# Patient Record
Sex: Female | Born: 1965 | Race: White | Hispanic: No | Marital: Married | Smoking: Never smoker
Health system: Southern US, Community
[De-identification: ages and names within clinical notes are randomized; demographics above are authoritative.]

## PROBLEM LIST (undated history)

## (undated) DIAGNOSIS — G43909 Migraine, unspecified, not intractable, without status migrainosus: Secondary | ICD-10-CM

## (undated) DIAGNOSIS — I10 Essential (primary) hypertension: Secondary | ICD-10-CM

## (undated) DIAGNOSIS — M542 Cervicalgia: Secondary | ICD-10-CM

## (undated) DIAGNOSIS — B9681 Helicobacter pylori [H. pylori] as the cause of diseases classified elsewhere: Secondary | ICD-10-CM

## (undated) DIAGNOSIS — G35 Multiple sclerosis: Secondary | ICD-10-CM

## (undated) DIAGNOSIS — K5900 Constipation, unspecified: Secondary | ICD-10-CM

## (undated) DIAGNOSIS — G43009 Migraine without aura, not intractable, without status migrainosus: Secondary | ICD-10-CM

## (undated) DIAGNOSIS — Z8639 Personal history of other endocrine, nutritional and metabolic disease: Secondary | ICD-10-CM

## (undated) DIAGNOSIS — K2970 Gastritis, unspecified, without bleeding: Secondary | ICD-10-CM

## (undated) DIAGNOSIS — G8929 Other chronic pain: Secondary | ICD-10-CM

## (undated) DIAGNOSIS — E785 Hyperlipidemia, unspecified: Secondary | ICD-10-CM

## (undated) DIAGNOSIS — E119 Type 2 diabetes mellitus without complications: Secondary | ICD-10-CM

## (undated) DIAGNOSIS — K760 Fatty (change of) liver, not elsewhere classified: Secondary | ICD-10-CM

## (undated) DIAGNOSIS — R109 Unspecified abdominal pain: Secondary | ICD-10-CM

## (undated) DIAGNOSIS — K219 Gastro-esophageal reflux disease without esophagitis: Secondary | ICD-10-CM

## (undated) DIAGNOSIS — G4730 Sleep apnea, unspecified: Secondary | ICD-10-CM

## (undated) DIAGNOSIS — Z862 Personal history of diseases of the blood and blood-forming organs and certain disorders involving the immune mechanism: Secondary | ICD-10-CM

## (undated) DIAGNOSIS — R1310 Dysphagia, unspecified: Secondary | ICD-10-CM

## (undated) HISTORY — DX: Helicobacter pylori (H. pylori) as the cause of diseases classified elsewhere: B96.81

## (undated) HISTORY — DX: Fatty (change of) liver, not elsewhere classified: K76.0

## (undated) HISTORY — PX: NECK SURGERY: SHX720

## (undated) HISTORY — PX: MANDIBLE SURGERY: SHX707

## (undated) HISTORY — DX: Hyperlipidemia, unspecified: E78.5

## (undated) HISTORY — PX: CHOLECYSTECTOMY: SHX55

## (undated) HISTORY — PX: ABDOMINAL HYSTERECTOMY: SHX81

## (undated) HISTORY — PX: APPENDECTOMY: SHX54

## (undated) HISTORY — DX: Gastritis, unspecified, without bleeding: K29.70

## (undated) HISTORY — DX: Essential (primary) hypertension: I10

---

## 2000-07-05 ENCOUNTER — Ambulatory Visit (HOSPITAL_COMMUNITY): Admission: RE | Admit: 2000-07-05 | Discharge: 2000-07-05 | Payer: Self-pay | Admitting: Pulmonary Disease

## 2000-11-02 ENCOUNTER — Ambulatory Visit (HOSPITAL_COMMUNITY): Admission: RE | Admit: 2000-11-02 | Discharge: 2000-11-02 | Payer: Self-pay | Admitting: Pulmonary Disease

## 2000-11-09 ENCOUNTER — Ambulatory Visit (HOSPITAL_COMMUNITY): Admission: RE | Admit: 2000-11-09 | Discharge: 2000-11-09 | Payer: Self-pay | Admitting: Pulmonary Disease

## 2000-11-09 ENCOUNTER — Emergency Department (HOSPITAL_COMMUNITY): Admission: EM | Admit: 2000-11-09 | Discharge: 2000-11-09 | Payer: Self-pay | Admitting: *Deleted

## 2000-11-09 ENCOUNTER — Encounter: Payer: Self-pay | Admitting: *Deleted

## 2000-11-10 ENCOUNTER — Encounter (HOSPITAL_COMMUNITY): Admission: RE | Admit: 2000-11-10 | Discharge: 2000-12-10 | Payer: Self-pay | Admitting: Pulmonary Disease

## 2000-12-21 ENCOUNTER — Encounter (HOSPITAL_COMMUNITY): Admission: RE | Admit: 2000-12-21 | Discharge: 2001-01-20 | Payer: Self-pay | Admitting: Pulmonary Disease

## 2001-04-28 ENCOUNTER — Other Ambulatory Visit: Admission: RE | Admit: 2001-04-28 | Discharge: 2001-04-28 | Payer: Self-pay | Admitting: Dermatology

## 2001-05-19 ENCOUNTER — Inpatient Hospital Stay (HOSPITAL_COMMUNITY): Admission: RE | Admit: 2001-05-19 | Discharge: 2001-05-20 | Payer: Self-pay | Admitting: Obstetrics & Gynecology

## 2001-12-14 ENCOUNTER — Encounter: Payer: Self-pay | Admitting: Internal Medicine

## 2001-12-14 ENCOUNTER — Emergency Department (HOSPITAL_COMMUNITY): Admission: EM | Admit: 2001-12-14 | Discharge: 2001-12-15 | Payer: Self-pay | Admitting: Internal Medicine

## 2002-03-16 ENCOUNTER — Emergency Department (HOSPITAL_COMMUNITY): Admission: EM | Admit: 2002-03-16 | Discharge: 2002-03-17 | Payer: Self-pay | Admitting: Internal Medicine

## 2002-07-05 ENCOUNTER — Ambulatory Visit (HOSPITAL_COMMUNITY): Admission: RE | Admit: 2002-07-05 | Discharge: 2002-07-05 | Payer: Self-pay | Admitting: Pulmonary Disease

## 2003-02-24 ENCOUNTER — Emergency Department (HOSPITAL_COMMUNITY): Admission: EM | Admit: 2003-02-24 | Discharge: 2003-02-24 | Payer: Self-pay | Admitting: *Deleted

## 2003-10-25 ENCOUNTER — Emergency Department (HOSPITAL_COMMUNITY): Admission: EM | Admit: 2003-10-25 | Discharge: 2003-10-25 | Payer: Self-pay | Admitting: Emergency Medicine

## 2004-01-21 ENCOUNTER — Emergency Department (HOSPITAL_COMMUNITY): Admission: EM | Admit: 2004-01-21 | Discharge: 2004-01-21 | Payer: Self-pay | Admitting: Emergency Medicine

## 2004-01-30 ENCOUNTER — Ambulatory Visit (HOSPITAL_COMMUNITY): Admission: RE | Admit: 2004-01-30 | Discharge: 2004-01-30 | Payer: Self-pay | Admitting: Orthopaedic Surgery

## 2004-05-07 ENCOUNTER — Ambulatory Visit (HOSPITAL_COMMUNITY): Admission: RE | Admit: 2004-05-07 | Discharge: 2004-05-07 | Payer: Self-pay | Admitting: *Deleted

## 2004-05-30 ENCOUNTER — Ambulatory Visit (HOSPITAL_COMMUNITY): Admission: RE | Admit: 2004-05-30 | Discharge: 2004-05-30 | Payer: Self-pay | Admitting: Family Medicine

## 2005-01-25 ENCOUNTER — Emergency Department (HOSPITAL_COMMUNITY): Admission: EM | Admit: 2005-01-25 | Discharge: 2005-01-25 | Payer: Self-pay | Admitting: Emergency Medicine

## 2005-03-30 HISTORY — PX: ESOPHAGOGASTRODUODENOSCOPY: SHX1529

## 2005-04-24 ENCOUNTER — Emergency Department (HOSPITAL_COMMUNITY): Admission: EM | Admit: 2005-04-24 | Discharge: 2005-04-24 | Payer: Self-pay | Admitting: Emergency Medicine

## 2005-05-25 ENCOUNTER — Ambulatory Visit (HOSPITAL_COMMUNITY): Admission: RE | Admit: 2005-05-25 | Discharge: 2005-05-25 | Payer: Self-pay | Admitting: Family Medicine

## 2005-07-17 ENCOUNTER — Emergency Department (HOSPITAL_COMMUNITY): Admission: EM | Admit: 2005-07-17 | Discharge: 2005-07-18 | Payer: Self-pay | Admitting: Emergency Medicine

## 2005-07-27 ENCOUNTER — Emergency Department (HOSPITAL_COMMUNITY): Admission: EM | Admit: 2005-07-27 | Discharge: 2005-07-27 | Payer: Self-pay | Admitting: Emergency Medicine

## 2005-07-27 ENCOUNTER — Inpatient Hospital Stay (HOSPITAL_COMMUNITY): Admission: EM | Admit: 2005-07-27 | Discharge: 2005-07-31 | Payer: Self-pay | Admitting: Emergency Medicine

## 2005-08-26 ENCOUNTER — Ambulatory Visit: Payer: Self-pay | Admitting: Infectious Diseases

## 2005-09-10 ENCOUNTER — Encounter (INDEPENDENT_AMBULATORY_CARE_PROVIDER_SITE_OTHER): Payer: Self-pay | Admitting: Specialist

## 2005-09-10 ENCOUNTER — Observation Stay (HOSPITAL_COMMUNITY): Admission: RE | Admit: 2005-09-10 | Discharge: 2005-09-11 | Payer: Self-pay | Admitting: Obstetrics and Gynecology

## 2005-10-28 ENCOUNTER — Ambulatory Visit: Payer: Self-pay | Admitting: Infectious Diseases

## 2005-11-16 ENCOUNTER — Emergency Department (HOSPITAL_COMMUNITY): Admission: EM | Admit: 2005-11-16 | Discharge: 2005-11-16 | Payer: Self-pay | Admitting: Emergency Medicine

## 2005-11-16 ENCOUNTER — Inpatient Hospital Stay (HOSPITAL_COMMUNITY): Admission: EM | Admit: 2005-11-16 | Discharge: 2005-11-19 | Payer: Self-pay | Admitting: Emergency Medicine

## 2005-11-18 ENCOUNTER — Encounter (INDEPENDENT_AMBULATORY_CARE_PROVIDER_SITE_OTHER): Payer: Self-pay | Admitting: Specialist

## 2005-11-18 ENCOUNTER — Ambulatory Visit: Payer: Self-pay | Admitting: Internal Medicine

## 2006-05-10 ENCOUNTER — Inpatient Hospital Stay (HOSPITAL_COMMUNITY): Admission: EM | Admit: 2006-05-10 | Discharge: 2006-05-13 | Payer: Self-pay | Admitting: Emergency Medicine

## 2006-05-10 ENCOUNTER — Ambulatory Visit: Payer: Self-pay | Admitting: Internal Medicine

## 2006-05-24 ENCOUNTER — Ambulatory Visit: Payer: Self-pay | Admitting: Internal Medicine

## 2006-05-28 ENCOUNTER — Ambulatory Visit (HOSPITAL_COMMUNITY): Admission: RE | Admit: 2006-05-28 | Discharge: 2006-05-28 | Payer: Self-pay | Admitting: Family Medicine

## 2006-08-02 ENCOUNTER — Emergency Department (HOSPITAL_COMMUNITY): Admission: EM | Admit: 2006-08-02 | Discharge: 2006-08-02 | Payer: Self-pay | Admitting: Emergency Medicine

## 2007-02-21 ENCOUNTER — Ambulatory Visit (HOSPITAL_COMMUNITY): Admission: RE | Admit: 2007-02-21 | Discharge: 2007-02-21 | Payer: Self-pay | Admitting: Family Medicine

## 2007-05-10 ENCOUNTER — Ambulatory Visit (HOSPITAL_COMMUNITY): Admission: RE | Admit: 2007-05-10 | Discharge: 2007-05-10 | Payer: Self-pay | Admitting: Family Medicine

## 2008-03-07 ENCOUNTER — Ambulatory Visit (HOSPITAL_COMMUNITY): Admission: RE | Admit: 2008-03-07 | Discharge: 2008-03-07 | Payer: Self-pay | Admitting: Family Medicine

## 2008-06-06 ENCOUNTER — Ambulatory Visit (HOSPITAL_COMMUNITY): Admission: RE | Admit: 2008-06-06 | Discharge: 2008-06-06 | Payer: Self-pay | Admitting: Family Medicine

## 2008-12-12 ENCOUNTER — Ambulatory Visit (HOSPITAL_COMMUNITY): Admission: RE | Admit: 2008-12-12 | Discharge: 2008-12-12 | Payer: Self-pay | Admitting: Family Medicine

## 2009-03-11 ENCOUNTER — Ambulatory Visit (HOSPITAL_COMMUNITY): Admission: RE | Admit: 2009-03-11 | Discharge: 2009-03-11 | Payer: Self-pay | Admitting: Family Medicine

## 2009-03-30 DIAGNOSIS — B9681 Helicobacter pylori [H. pylori] as the cause of diseases classified elsewhere: Secondary | ICD-10-CM

## 2009-03-30 HISTORY — DX: Helicobacter pylori (H. pylori) as the cause of diseases classified elsewhere: B96.81

## 2009-04-19 ENCOUNTER — Ambulatory Visit (HOSPITAL_COMMUNITY): Admission: RE | Admit: 2009-04-19 | Discharge: 2009-04-19 | Payer: Self-pay | Admitting: Rheumatology

## 2009-04-23 ENCOUNTER — Ambulatory Visit (HOSPITAL_COMMUNITY): Admission: RE | Admit: 2009-04-23 | Discharge: 2009-04-23 | Payer: Self-pay | Admitting: Neurology

## 2009-05-03 ENCOUNTER — Ambulatory Visit (HOSPITAL_COMMUNITY): Admission: RE | Admit: 2009-05-03 | Discharge: 2009-05-03 | Payer: Self-pay | Admitting: Psychiatry

## 2009-05-08 ENCOUNTER — Ambulatory Visit (HOSPITAL_COMMUNITY): Admission: RE | Admit: 2009-05-08 | Discharge: 2009-05-08 | Payer: Self-pay | Admitting: Family Medicine

## 2009-05-14 DIAGNOSIS — G43009 Migraine without aura, not intractable, without status migrainosus: Secondary | ICD-10-CM | POA: Insufficient documentation

## 2009-05-14 DIAGNOSIS — Z862 Personal history of diseases of the blood and blood-forming organs and certain disorders involving the immune mechanism: Secondary | ICD-10-CM

## 2009-05-14 DIAGNOSIS — R5381 Other malaise: Secondary | ICD-10-CM | POA: Insufficient documentation

## 2009-05-14 DIAGNOSIS — R197 Diarrhea, unspecified: Secondary | ICD-10-CM | POA: Insufficient documentation

## 2009-05-14 DIAGNOSIS — R1110 Vomiting, unspecified: Secondary | ICD-10-CM | POA: Insufficient documentation

## 2009-05-14 DIAGNOSIS — R110 Nausea: Secondary | ICD-10-CM | POA: Insufficient documentation

## 2009-05-14 DIAGNOSIS — Z8639 Personal history of other endocrine, nutritional and metabolic disease: Secondary | ICD-10-CM

## 2009-05-14 DIAGNOSIS — R5383 Other fatigue: Secondary | ICD-10-CM

## 2009-05-14 DIAGNOSIS — Z91199 Patient's noncompliance with other medical treatment and regimen due to unspecified reason: Secondary | ICD-10-CM | POA: Insufficient documentation

## 2009-05-14 DIAGNOSIS — R630 Anorexia: Secondary | ICD-10-CM | POA: Insufficient documentation

## 2009-05-14 DIAGNOSIS — J189 Pneumonia, unspecified organism: Secondary | ICD-10-CM | POA: Insufficient documentation

## 2009-05-14 DIAGNOSIS — K219 Gastro-esophageal reflux disease without esophagitis: Secondary | ICD-10-CM | POA: Insufficient documentation

## 2009-05-14 DIAGNOSIS — Z8659 Personal history of other mental and behavioral disorders: Secondary | ICD-10-CM | POA: Insufficient documentation

## 2009-05-14 DIAGNOSIS — J45909 Unspecified asthma, uncomplicated: Secondary | ICD-10-CM | POA: Insufficient documentation

## 2009-05-14 DIAGNOSIS — A048 Other specified bacterial intestinal infections: Secondary | ICD-10-CM | POA: Insufficient documentation

## 2009-05-14 HISTORY — DX: Patient's noncompliance with other medical treatment and regimen due to unspecified reason: Z91.199

## 2009-05-14 HISTORY — DX: Personal history of diseases of the blood and blood-forming organs and certain disorders involving the immune mechanism: Z86.2

## 2009-05-14 HISTORY — DX: Migraine without aura, not intractable, without status migrainosus: G43.009

## 2009-05-16 ENCOUNTER — Ambulatory Visit: Payer: Self-pay | Admitting: Internal Medicine

## 2009-05-16 DIAGNOSIS — K5900 Constipation, unspecified: Secondary | ICD-10-CM | POA: Insufficient documentation

## 2009-05-16 DIAGNOSIS — R109 Unspecified abdominal pain: Secondary | ICD-10-CM | POA: Insufficient documentation

## 2009-05-16 DIAGNOSIS — K7689 Other specified diseases of liver: Secondary | ICD-10-CM | POA: Insufficient documentation

## 2009-05-16 HISTORY — DX: Constipation, unspecified: K59.00

## 2009-05-20 ENCOUNTER — Encounter: Payer: Self-pay | Admitting: Internal Medicine

## 2009-05-22 ENCOUNTER — Ambulatory Visit: Payer: Self-pay | Admitting: Internal Medicine

## 2009-05-22 ENCOUNTER — Ambulatory Visit (HOSPITAL_COMMUNITY): Admission: RE | Admit: 2009-05-22 | Discharge: 2009-05-22 | Payer: Self-pay | Admitting: Internal Medicine

## 2009-05-22 HISTORY — PX: ESOPHAGOGASTRODUODENOSCOPY: SHX1529

## 2009-05-24 ENCOUNTER — Ambulatory Visit (HOSPITAL_COMMUNITY): Admission: RE | Admit: 2009-05-24 | Discharge: 2009-05-24 | Payer: Self-pay | Admitting: Internal Medicine

## 2009-06-10 ENCOUNTER — Telehealth (INDEPENDENT_AMBULATORY_CARE_PROVIDER_SITE_OTHER): Payer: Self-pay

## 2009-06-11 ENCOUNTER — Encounter: Payer: Self-pay | Admitting: Internal Medicine

## 2009-06-11 ENCOUNTER — Encounter: Payer: Self-pay | Admitting: Urgent Care

## 2009-06-17 ENCOUNTER — Ambulatory Visit (HOSPITAL_COMMUNITY): Admission: RE | Admit: 2009-06-17 | Discharge: 2009-06-17 | Payer: Self-pay | Admitting: Internal Medicine

## 2009-07-01 ENCOUNTER — Ambulatory Visit (HOSPITAL_COMMUNITY): Admission: RE | Admit: 2009-07-01 | Discharge: 2009-07-01 | Payer: Self-pay | Admitting: Family Medicine

## 2009-07-02 ENCOUNTER — Emergency Department (HOSPITAL_COMMUNITY): Admission: EM | Admit: 2009-07-02 | Discharge: 2009-07-02 | Payer: Self-pay | Admitting: Emergency Medicine

## 2009-07-04 ENCOUNTER — Ambulatory Visit: Payer: Self-pay | Admitting: Internal Medicine

## 2009-07-04 ENCOUNTER — Encounter (INDEPENDENT_AMBULATORY_CARE_PROVIDER_SITE_OTHER): Payer: Self-pay

## 2009-07-05 DIAGNOSIS — R1011 Right upper quadrant pain: Secondary | ICD-10-CM | POA: Insufficient documentation

## 2009-07-05 DIAGNOSIS — K625 Hemorrhage of anus and rectum: Secondary | ICD-10-CM | POA: Insufficient documentation

## 2009-07-05 HISTORY — DX: Hemorrhage of anus and rectum: K62.5

## 2009-07-08 ENCOUNTER — Encounter: Payer: Self-pay | Admitting: Internal Medicine

## 2009-07-09 ENCOUNTER — Ambulatory Visit (HOSPITAL_COMMUNITY): Admission: RE | Admit: 2009-07-09 | Discharge: 2009-07-09 | Payer: Self-pay | Admitting: Internal Medicine

## 2009-07-09 ENCOUNTER — Ambulatory Visit: Payer: Self-pay | Admitting: Internal Medicine

## 2009-07-09 HISTORY — PX: COLONOSCOPY: SHX5424

## 2009-07-12 ENCOUNTER — Encounter: Payer: Self-pay | Admitting: Internal Medicine

## 2009-07-23 ENCOUNTER — Emergency Department (HOSPITAL_COMMUNITY): Admission: EM | Admit: 2009-07-23 | Discharge: 2009-07-23 | Payer: Self-pay | Admitting: Emergency Medicine

## 2009-08-07 ENCOUNTER — Ambulatory Visit (HOSPITAL_COMMUNITY): Admission: RE | Admit: 2009-08-07 | Discharge: 2009-08-07 | Payer: Self-pay | Admitting: *Deleted

## 2009-08-14 ENCOUNTER — Encounter: Payer: Self-pay | Admitting: Internal Medicine

## 2009-12-12 ENCOUNTER — Emergency Department (HOSPITAL_COMMUNITY): Admission: EM | Admit: 2009-12-12 | Discharge: 2009-12-12 | Payer: Self-pay | Admitting: Emergency Medicine

## 2010-02-19 ENCOUNTER — Emergency Department (HOSPITAL_COMMUNITY): Admission: EM | Admit: 2010-02-19 | Discharge: 2010-02-19 | Payer: Self-pay | Admitting: Emergency Medicine

## 2010-03-01 ENCOUNTER — Emergency Department (HOSPITAL_COMMUNITY)
Admission: EM | Admit: 2010-03-01 | Discharge: 2010-03-01 | Payer: Self-pay | Source: Home / Self Care | Admitting: Emergency Medicine

## 2010-04-03 ENCOUNTER — Ambulatory Visit (HOSPITAL_COMMUNITY)
Admission: RE | Admit: 2010-04-03 | Discharge: 2010-04-03 | Payer: Self-pay | Source: Home / Self Care | Attending: Internal Medicine | Admitting: Internal Medicine

## 2010-04-20 ENCOUNTER — Encounter: Payer: Self-pay | Admitting: Internal Medicine

## 2010-04-20 ENCOUNTER — Encounter: Payer: Self-pay | Admitting: Family Medicine

## 2010-04-29 NOTE — Letter (Signed)
Summary: External Other  External Other   Imported By: Peggyann Shoals 08/14/2009 11:41:08  _____________________________________________________________________  External Attachment:    Type:   Image     Comment:   External Document

## 2010-04-29 NOTE — Miscellaneous (Signed)
Summary: Orders Update  Clinical Lists Changes  Problems: Added new problem of SCREENING FOR UNSPECIFIED CONDITION (ICD-V82.9) Orders: Added new Test order of T-Creatinine Blood ([id number]) - Signed 

## 2010-04-29 NOTE — Assessment & Plan Note (Signed)
Summary: e30,abd pain,glu   Visit Type:  Initial Consult Referring Provider:  [facility] Primary Care Provider:  Rosey Bath [facility]  Chief Complaint:  Abd pain and left side pain.  History of Present Illness: 45 y/o caucasian female w/ left-sided abd pain x 9 months.  Can't lay on left side.  Constant sharp pain, radiates around flank to back.  Pain 5/10-10/10 at worst.  Some constipation BM 2-3 per week, hard stools.  Not taking anything for this.  Denies rectal bleeding or melena.  Weight loss approx 3# per month w/ healthy eating, controlling portions, and walking.  Pain is worse w/ walking.  c/o nausea w/ pain, no vomiting.  Coke helps w/ nausea.  Taking oxycodone and flexeril for disc disease at night only x 3 months.  No NSAIDs.  Denies any fever, occ chills.  c/o HB and indigestion, worse w/ chocolate & spicy foods, about once weekly.  Takes omeprazole 20mg  as needed w/ good relief.  Occ TUMS.  Denies dysphagia or odynophagia.   Hx h pylori w/ previous treatment yrs ago.  Current Problems (verified): 1)  Fatty Liver Disease  (ICD-571.8) 2)  Abdominal Pain  (ICD-789.00) 3)  Esophageal Reflux  (ICD-530.81) 4)  Migraine, Common  (ICD-346.10) 5)  Pneumonia, Atypical  (ICD-486) 6)  Asthma  (ICD-493.90) 7)  Helicobacter Pylori Gastritis  (ICD-041.86) 8)  Depression, Hx of  (ICD-V11.8) 9)  Anorexia  (ICD-783.0) 10)  Fatigue  (ICD-780.79) 11)  Liver Function Tests, Abnormal, Hx of  (ICD-V12.2) 12)  Diarrhea  (ICD-787.91) 13)  Vomiting  (ICD-787.03) 14)  Nausea  (ICD-787.02)  Current Medications (verified): 1)  Celexa 40 Mg Tabs (Citalopram Hydrobromide) .... At Bedtime 2)  Proair .... As Needed 3)  Midrin .... 2 @ Onset of Ha 4)  Epi Pen .... As Needed 5)  Oxycodone Hcl 5 Mg Tabs (Oxycodone Hcl) .... As Needed 6)  Flexeril 10 Mg Tabs (Cyclobenzaprine Hcl) .... As Needed 7)  Topamax 25 Mg Tabs (Topiramate) .... Take Two Tablets in The Am and  Two Tablets in The Pm 8)  Omeprazole 20  Mg Cpdr (Omeprazole) .Marland Kitchen.. 1 By Mouth As Needed Gerd 9)  Tums 500 Mg Chew (Calcium Carbonate Antacid) .... 2 By Mouth As Needed Gerd  Allergies (verified): 1)  ! Prednisone 2)  ! Prilosec 3)  ! * Peanuts  Past History:  Past Medical History: FATTY LIVER DISEASE (ICD-571.8) ESOPHAGEAL REFLUX (ICD-530.81) MIGRAINE, COMMON (ICD-346.10) PNEUMONIA, ATYPICAL (ICD-486) ASTHMA (ICD-493.90) HELICOBACTER PYLORI GASTRITIS (ICD-041.86) on EGD 8/07, non-critical Schatski's ring Hx atypical PNA MAI 2007 DEPRESSION, HX OF (ICD-V11.8) Colonoscopy 2002 reportedly normal  Past Surgical History: CHOLECYSTECTOMY 2002 PARTIAL HYSTERECTOMY 2003 BILATERAL SALPINGO-OOPHORECTOMY WITH BLADDER SUSPENSION 2007 PLATE PLACED IN NECK 2006 APPENDECTOMY REDUCTION OF LOWER JAW AT AGE 67 right hand surgery  Family History: No known family history of colorectal carcinoma, IBD, liver or chronic GI problems. Father:(late 60's) DM, HTN, CAD Mother: (deceased 54) CA? Siblings: 3 DM  Social History: married, 5 children raising 39 y/o grandson Patient has never smoked.  Alcohol Use - yes, rare  Review of Systems      See HPI General:  Denies fever, chills, sweats, anorexia, fatigue, weakness, malaise, and sleep disorder. CV:  Denies chest pains, angina, palpitations, syncope, dyspnea on exertion, orthopnea, PND, peripheral edema, and claudication. Resp:  Denies dyspnea at rest, dyspnea with exercise, cough, sputum, wheezing, coughing up blood, and pleurisy. GI:  Denies difficulty swallowing, pain on swallowing, jaundice, and fecal incontinence. GU:  Denies urinary burning, blood  in urine, nocturnal urination, urinary frequency, and urinary incontinence. Derm:  Denies rash, itching, dry skin, hives, moles, warts, and unhealing ulcers. Psych:  Denies depression, anxiety, memory loss, suicidal ideation, hallucinations, paranoia, phobia, and confusion. Heme:  Denies bruising, bleeding, and enlarged lymph  nodes.  Vital Signs:  Patient profile:   45 year old female Height:      65 inches Weight:      223 pounds BMI:     37.24 Temp:     99.9 degrees F oral Pulse rate:   60 / minute BP sitting:   130 / 90  (left arm) Cuff size:   regular  Vitals Entered By: Cloria Spring LPN (May 16, 2009 9:45 AM)  Physical Exam  General:  Well developed, well nourished, no acute distress.obese.   Head:  Normocephalic and atraumatic. Eyes:  Sclera clear, no icterus. Ears:  Normal auditory acuity. Nose:  No deformity, discharge,  or lesions. Mouth:  No deformity or lesions, dentition normal. Neck:  Supple; no masses or thyromegaly. Lungs:  Clear throughout to auscultation. Heart:  Regular rate and rhythm; no murmurs, rubs,  or bruits. Abdomen:  normal bowel sounds, obese, LUQ tenderness, L flank tenderness, with guarding, without rebound, no hernia, no distesion, no masses, and no hepatomegally or splenomegaly.  Positive Carnett's. Msk:  Symmetrical with no gross deformities. Normal posture. Pulses:  Normal pulses noted. Extremities:  No clubbing, cyanosis, edema or deformities noted. Neurologic:  Alert and  oriented x4;  grossly normal neurologically. Skin:  Intact without significant lesions or rashes. Cervical Nodes:  No significant cervical adenopathy. Psych:  Alert and cooperative. Normal mood and affect.  Impression & Recommendations:  Problem # 1:  ABDOMINAL PAIN (ICD-789.00) 45 y/o caucasian female w/ 33-month hx abd pain, along with GERD & constipation.  ?functional component, abd wall pain, and/or possible referred back pain.  Discussed CT findings of transient intussusception w/ Dr. Darrick Penna and Dr. Argentina Donovan (radiologist).  No lead point or wall thickening, suspect not clinically significant, however could consider CTE if continued LUQ pain.  Next step EGD w/ possible small bowel bx r/o celiac disease.  Need to verify h pylori eradication.  EGD to be performed by Dr. Jonathon Bellows  in the near future.  I have discussed risks and benefits which include, but are not limited to, bleeding, infection, perforation, or medication reaction.  The patient agrees with this plan and consent will be obtained.  Orders: T-Helicobactor Pylori Antigen Stool (04540) Est. Patient Level IV (98119)  Problem # 2:  ESOPHAGEAL REFLUX (ICD-530.81) See #!  Problem # 3:  HELICOBACTER PYLORI GASTRITIS (ICD-041.86) see #1  Problem # 4:  FATTY LIVER DISEASE (ICD-571.8) Assessment: Comment Only  Problem # 5:  CONSTIPATION (ICD-564.00)

## 2010-04-29 NOTE — Letter (Signed)
Summary: MRI ORDER  MRI ORDER   Imported By: Ave Filter 06/11/2009 10:08:29  _____________________________________________________________________  External Attachment:    Type:   Image     Comment:   External Document

## 2010-04-29 NOTE — Assessment & Plan Note (Signed)
Summary: luq pain.gu   Visit Type:  Follow-up Visit Primary Care Provider:  [facility]  Chief Complaint:  abd pain, nausea, and constipation.  History of Present Illness: 6 month history of left upper quadrant abdominal pain. Prior CT demonstrated a transient intussusception  CT enteroscopy failed to demonstrate any bowel abnormality. She's never developed a rash to suggest zoster as an etiology. She has a difficult time lying on her left side; there was concern for radicular component to her symptoms. She underwent an MRI spine which demonstrated no significant lesion which would explain her current symptoms.  EGD demonstrated H. pyloric gastritis. She did take therapy. However, this did not help with her symptoms.  She does relate a six-month history of relative constipation as well. She's going from one bowel movement daily to every other day to no more than one bowel movement weekly - often utilizing magnesium citrate p.r.n. She states she tried MiraLax only temporarily without any perceived  improvement in bowel function.  She further states that left upper quadrant pain is getting worse and is getting the "best of her". She has not had any fever or chills. No nausea or vomiting.  She has managed to gain 8 pounds since her last visit here.  It is noteworthy that she noted some intermittent bright red blood per rectum with wiping and on the stool from time to time recently.  This nice lady  just underwent yet another CT scan 3 days ago which, again, did not elucidate a cause for this nice ladies.  Chem 20 from April 4 looked  good except for a slightly elevated ALT of 53; CBC was completely normal lipase was normal at 17.  Current Problems (verified): 1)  Screening For Unspecified Condition  (ICD-V82.9) 2)  Constipation  (ICD-564.00) 3)  Fatty Liver Disease  (ICD-571.8) 4)  Abdominal Pain  (ICD-789.00) 5)  Esophageal Reflux  (ICD-530.81) 6)  Migraine, Common  (ICD-346.10) 7)   Pneumonia, Atypical  (ICD-486) 8)  Asthma  (ICD-493.90) 9)  Helicobacter Pylori Gastritis  (ICD-041.86) 10)  Depression, Hx of  (ICD-V11.8) 11)  Anorexia  (ICD-783.0) 12)  Fatigue  (ICD-780.79) 13)  Liver Function Tests, Abnormal, Hx of  (ICD-V12.2) 14)  Diarrhea  (ICD-787.91) 15)  Vomiting  (ICD-787.03) 16)  Nausea  (ICD-787.02)  Current Medications (verified): 1)  Celexa 40 Mg Tabs (Citalopram Hydrobromide) .... At Bedtime 2)  Proair .... As Needed 3)  Midrin .... 2 @ Onset of Ha 4)  Epi Pen .... As Needed 5)  Oxycodone Hcl 5 Mg Tabs (Oxycodone Hcl) .... As Needed 6)  Flexeril 10 Mg Tabs (Cyclobenzaprine Hcl) .... As Needed 7)  Topamax 25 Mg Tabs (Topiramate) .... Take Two Tablets in The Am and  Two Tablets in The Pm 8)  Omeprazole 20 Mg Cpdr (Omeprazole) .Marland Kitchen.. 1 By Mouth As Needed Gerd 9)  Tums 500 Mg Chew (Calcium Carbonate Antacid) .... 2 By Mouth As Needed Gerd  Allergies (verified): 1)  ! Prednisone 2)  ! Prilosec 3)  ! * Peanuts  Past History:  Past Medical History: Last updated: 05/16/2009 FATTY LIVER DISEASE (ICD-571.8) ESOPHAGEAL REFLUX (ICD-530.81) MIGRAINE, COMMON (ICD-346.10) PNEUMONIA, ATYPICAL (ICD-486) ASTHMA (ICD-493.90) HELICOBACTER PYLORI GASTRITIS (ICD-041.86) on EGD 8/07, non-critical Schatski's ring Hx atypical PNA MAI 2007 DEPRESSION, HX OF (ICD-V11.8) Colonoscopy 2002 reportedly normal  Past Surgical History: Last updated: 05/16/2009 CHOLECYSTECTOMY 2002 PARTIAL HYSTERECTOMY 2003 BILATERAL SALPINGO-OOPHORECTOMY WITH BLADDER SUSPENSION 2007 PLATE PLACED IN NECK 2006 APPENDECTOMY REDUCTION OF LOWER JAW AT AGE 45 right  hand surgery  Family History: Last updated: 05/16/2009 No known family history of colorectal carcinoma, IBD, liver or chronic GI problems. Father:(late 60's) DM, HTN, CAD Mother: (deceased 3) CA? Siblings: 3 DM  Social History: Last updated: 05/16/2009 married, 5 children raising 82 y/o grandson Patient has never  smoked.  Alcohol Use - yes, rare  Risk Factors: Smoking Status: never (05/16/2009)  Vital Signs:  Patient profile:   45 year old female Height:      65 inches Weight:      231 pounds BMI:     38.58 Temp:     98.9 degrees F oral Pulse rate:   88 / minute BP sitting:   110 / 82  (left arm) Cuff size:   large  Vitals Entered By: Hendricks Limes LPN (July 05, 979 11:14 AM)  Physical Exam  General:  very pleasant alert lady in no acute distress Eyes:  no scleral icterus. Conjunctiva are pink Lungs:  clear to auscultation Heart:  regular rate and rhythm without murmur gallop rub Abdomen:  obese. Nondistended positive bowel sounds soft she does have some left upper quadrant tenderness and a lesser degree she's tender in the left lower quadrant and the right abdomen. I did not appreciate any mass organomegaly. There is no rebound. Rectal:  deferred until time of colonoscopy  Impression & Recommendations: Impression: Very pleasant 45 year old lady with a 6 month history of left upper quadrant abdominal pain which is unrelenting and insidiously worsened by patient report. She did have a transient intussusception noted on the prior CT however, by itself, and the subsequent negative imaging studies this more likely has nothing to do with her pain. Her symptoms are not likely radicular in origin with negative CT and a failure to ever develop a rash in the affected area. Moreover, she was noted to have H. pylori gastritis and did take treatment. Again, likely a separate issue.  Shows a mildly elevated ALT in a setting of a fatty appearing liver and obesity. This is the likely etiology.  I am struck by the observation that she has developed significant constipation ileus carefully related to her left upper quadrant abdominal pain.  She now has intermittent rectal bleeding.  Recommendations: She needs to have a diagnostic colonoscopy. Risks, benefits, limitations, alternatives and imponderables  have been reviewed. Her questions were answered. She is agreeable.  I told as much lady that even after colonoscopy is performed it is possible that we will not have an answer for her abdominal pain and tertiary referral may be necessary. In addition, in the interim I would ask her to get back on MiraLax and titrate the dose up to 17 g orally b.i.d. to facilitate stooling daily to every other day to see if this makes a difference in her symptoms. In addition, it will be very interesting  to see if the colonoscopy prep gives her any significant relief.  Further recommendations to follow.  Appended Document: Orders Update    Clinical Lists Changes  Problems: Added new problem of RUQ PAIN (ICD-789.01) Added new problem of RECTAL BLEEDING (ICD-569.3) Orders: Added new Service order of Est. Patient Level IV (19147) - Signed

## 2010-04-29 NOTE — Miscellaneous (Signed)
Summary: ct from [facility]  Clinical Lists Changes CT Abd/Pelvis W CM - STATUS: Final  IMAGE                                     Perform Date: 4 Apr11 14:08  Ordered ByNobie Putnam MD , MARK S          Ordered Date: 4 Apr11 12:11  Facility: [facility]                               Department: CT  Service Report Text  [facility] Accession Number: [id number]      Clinical Data: Left upper quadrant pain, past history of   cholecystectomy, appendectomy, hysterectomy    CT ABDOMEN AND PELVIS WITH CONTRAST    Technique:  Multidetector CT imaging of the abdomen and pelvis was   performed following the standard protocol during bolus   administration of intravenous contrast. Breast shield utilized.   Sagittal and coronal MPR images reconstructed from axial data set.    Contrast: Dilute oral contrast and 100 ml Omnipaque-300 IV    Comparison: 05/24/2009    Findings:   Minimal dependent atelectasis at lung bases.   Status post cholecystectomy, hysterectomy and appendectomy.   Minimal fatty infiltration of liver.   No focal abnormalities of liver, spleen, kidneys, or adrenal   glands.   Small splenule inferior to spleen.   Partial fatty replacement at head of pancreas, similar to prior   exam.   No focal pancreatic mass, ductal dilatation or calcification.   Stomach incompletely distended.   Minimal sigmoid diverticulosis without diverticulitis.    Few small bowel loops in the left upper quadrant are minimally more   prominent in overall diameter than remaining loops though this is   unchanged since previous exam without evidence of wall thickening   or intussusception.   Large and small bowel loops otherwise unremarkable.   Bladder and distal ureters unremarkable.   Bilateral pelvic phleboliths.   No mass, adenopathy, free fluid or inflammatory process.   Scattered normal-sized nonspecific mesenteric lymph nodes right mid   abdomen.   No focal bony lesions.    IMPRESSION:   Partial fatty  replacement of the pancreas.   Sigmoid diverticulosis.   No acute upper abdominal abnormalities.    Read By:  Lollie Marrow,  M.D.   Released By:  Lollie Marrow,  M.D.  Additional Information  HL7 RESULT STATUS : F  External image : [id number]  External IF Update Timestamp : 2009-07-01:14:28:06.000000

## 2010-04-29 NOTE — Letter (Signed)
Summary: [facility] REFERRAL  [facility] REFERRAL   Imported By: Ave Filter 07/09/2009 14:33:55  _____________________________________________________________________  External Attachment:    Type:   Image     Comment:   External Document

## 2010-04-29 NOTE — Letter (Signed)
Summary: [facility]ICE [facility]  OFFICE [facility]   Imported By: Diana Eves 05/20/2009 14:04:34  _____________________________________________________________________  External Attachment:    Type:   Image     Comment:   External Document

## 2010-04-29 NOTE — Letter (Signed)
Summary: [facility] ORDER  [facility] ORDER   Imported By: Ave Filter 07/04/2009 13:00:28  _____________________________________________________________________  External Attachment:    Type:   Image     Comment:   External Document

## 2010-04-29 NOTE — Letter (Signed)
Summary: [facility] REFERRAL  [facility] REFERRAL   Imported By: Ave Filter 07/09/2009 15:32:52  _____________________________________________________________________  External Attachment:    Type:   Image     Comment:   External Document

## 2010-04-29 NOTE — Letter (Signed)
Summary: EGD ORDER  EGD ORDER   Imported By: Ave Filter 05/20/2009 11:29:13  _____________________________________________________________________  External Attachment:    Type:   Image     Comment:   External Document

## 2010-04-29 NOTE — Letter (Signed)
Summary: [facility] APPT CONFIRMATION  [facility] APPT CONFIRMATION   Imported By: Ave Filter 07/12/2009 09:30:47  _____________________________________________________________________  External Attachment:    Type:   Image     Comment:   External Document  Appended Document: [facility] APPT CONFIRMATION Pt informed.

## 2010-04-29 NOTE — Progress Notes (Signed)
Summary: Pt completed meds for H. Pylori/still having pain  Phone Note Call from Patient   Caller: Patient Summary of Call: Pt  called and said she completed the tx for H. Pylori x 2 days ago. She is still having alot of abd pain, left side pain and alot of nausea. Wants to know what she should do now. (Uses [facility]) Initial call taken by: Cloria Spring LPN,  June 10, 2009 2:23 PM     Appended Document: Pt completed meds for H. Pylori/still having pain Needs MRI spine unless done somewhere else. re:abd, left flank/side pain.  Appended Document: Pt completed meds for H. Pylori/still having pain tried to call pt- # busy  Appended Document: Pt completed meds for H. Pylori/still having pain Pt scheduled for MRI 06/17/09@2 :00 p.m. Pt stated she never had an MRI of the Spine.

## 2010-05-02 NOTE — Letter (Signed)
Summary: [facility]  [facility]   Imported By: Diana Eves 07/08/2009 12:13:20  _____________________________________________________________________  External Attachment:    Type:   Image     Comment:   External Document

## 2010-05-12 ENCOUNTER — Other Ambulatory Visit (HOSPITAL_COMMUNITY): Payer: Self-pay | Admitting: Internal Medicine

## 2010-05-15 ENCOUNTER — Ambulatory Visit (HOSPITAL_COMMUNITY)
Admission: RE | Admit: 2010-05-15 | Discharge: 2010-05-15 | Disposition: A | Discharge status: Home / Self Care | Payer: BC Managed Care – PPO | Source: Ambulatory Visit | Attending: Internal Medicine | Admitting: Internal Medicine

## 2010-05-15 DIAGNOSIS — R109 Unspecified abdominal pain: Secondary | ICD-10-CM | POA: Insufficient documentation

## 2010-05-15 DIAGNOSIS — K7689 Other specified diseases of liver: Secondary | ICD-10-CM | POA: Insufficient documentation

## 2010-05-29 ENCOUNTER — Emergency Department (HOSPITAL_COMMUNITY)
Admission: EM | Admit: 2010-05-29 | Discharge: 2010-05-29 | Disposition: A | Discharge status: Home / Self Care | Payer: BC Managed Care – PPO | Attending: Emergency Medicine | Admitting: Emergency Medicine

## 2010-05-29 ENCOUNTER — Emergency Department (HOSPITAL_COMMUNITY): Payer: BC Managed Care – PPO

## 2010-05-29 DIAGNOSIS — M542 Cervicalgia: Secondary | ICD-10-CM | POA: Insufficient documentation

## 2010-05-29 DIAGNOSIS — M25519 Pain in unspecified shoulder: Secondary | ICD-10-CM | POA: Insufficient documentation

## 2010-05-29 DIAGNOSIS — M5412 Radiculopathy, cervical region: Secondary | ICD-10-CM | POA: Insufficient documentation

## 2010-06-09 LAB — CBC
HCT: 38.2 % (ref 36.0–46.0)
Hemoglobin: 13.5 g/dL (ref 12.0–15.0)
Platelets: 214 10*3/uL (ref 150–400)
WBC: 5.1 10*3/uL (ref 4.0–10.5)

## 2010-06-09 LAB — BASIC METABOLIC PANEL
BUN: 8 mg/dL (ref 6–23)
CO2: 23 mEq/L (ref 19–32)
Calcium: 8.6 mg/dL (ref 8.4–10.5)
Glucose, Bld: 102 mg/dL — ABNORMAL HIGH (ref 70–99)

## 2010-06-09 LAB — DIFFERENTIAL
Basophils Relative: 0 % (ref 0–1)
Lymphs Abs: 1.1 10*3/uL (ref 0.7–4.0)
Monocytes Absolute: 0.3 10*3/uL (ref 0.1–1.0)
Neutro Abs: 3.6 10*3/uL (ref 1.7–7.7)
Neutrophils Relative %: 71 % (ref 43–77)

## 2010-06-09 LAB — D-DIMER, QUANTITATIVE: D-Dimer, Quant: 0.77 ug/mL-FEU — ABNORMAL HIGH (ref 0.00–0.48)

## 2010-06-10 LAB — BASIC METABOLIC PANEL
Calcium: 9.3 mg/dL (ref 8.4–10.5)
Creatinine, Ser: 0.89 mg/dL (ref 0.4–1.2)
GFR calc non Af Amer: >60 mL/min (ref >60)
Glucose, Bld: 127 mg/dL — ABNORMAL HIGH (ref 70–99)

## 2010-06-20 LAB — CREATININE, SERUM: Creatinine, Ser: 0.73 mg/dL (ref 0.4–1.2)

## 2010-07-28 ENCOUNTER — Emergency Department (HOSPITAL_COMMUNITY)
Admission: EM | Admit: 2010-07-28 | Discharge: 2010-07-28 | Disposition: A | Discharge status: Home / Self Care | Payer: BC Managed Care – PPO | Attending: Emergency Medicine | Admitting: Emergency Medicine

## 2010-07-28 DIAGNOSIS — G35 Multiple sclerosis: Secondary | ICD-10-CM | POA: Insufficient documentation

## 2010-07-28 DIAGNOSIS — K7689 Other specified diseases of liver: Secondary | ICD-10-CM | POA: Insufficient documentation

## 2010-07-28 DIAGNOSIS — B09 Unspecified viral infection characterized by skin and mucous membrane lesions: Secondary | ICD-10-CM | POA: Insufficient documentation

## 2010-07-28 DIAGNOSIS — G43909 Migraine, unspecified, not intractable, without status migrainosus: Secondary | ICD-10-CM | POA: Insufficient documentation

## 2010-07-28 DIAGNOSIS — J45909 Unspecified asthma, uncomplicated: Secondary | ICD-10-CM | POA: Insufficient documentation

## 2010-07-28 DIAGNOSIS — E7800 Pure hypercholesterolemia, unspecified: Secondary | ICD-10-CM | POA: Insufficient documentation

## 2010-08-14 ENCOUNTER — Ambulatory Visit (HOSPITAL_COMMUNITY)
Admission: RE | Admit: 2010-08-14 | Discharge: 2010-08-14 | Disposition: A | Discharge status: Home / Self Care | Payer: BC Managed Care – PPO | Source: Ambulatory Visit | Attending: Internal Medicine | Admitting: Internal Medicine

## 2010-08-14 DIAGNOSIS — IMO0001 Reserved for inherently not codable concepts without codable children: Secondary | ICD-10-CM | POA: Insufficient documentation

## 2010-08-14 DIAGNOSIS — M5412 Radiculopathy, cervical region: Secondary | ICD-10-CM | POA: Insufficient documentation

## 2010-08-14 DIAGNOSIS — M6281 Muscle weakness (generalized): Secondary | ICD-10-CM | POA: Insufficient documentation

## 2010-08-14 DIAGNOSIS — M542 Cervicalgia: Secondary | ICD-10-CM | POA: Insufficient documentation

## 2010-08-15 NOTE — Group Therapy Note (Signed)
NAMEBrennan, Karam Melaine                   ACCOUNT NO.:  [id number]   MEDICAL RECORD NO.:  [id number]          PATIENT TYPE:  INP   LOCATION:  A341                          FACILITY:  [facility]   PHYSICIAN:  Edward L. Juanetta Gosling, M.D.DATE OF BIRTH:  04/04/1965   DATE OF PROCEDURE:  DATE OF DISCHARGE:                                   PROGRESS NOTE   SUBJECTIVE:  Ms. Fahr is doing okay.  We are awaiting her 3 AFB smears.  She has had one which is negative.  She will need 3 before it is safe for  her to go home.      Edward L. Juanetta Gosling, M.D.  Electronically Signed     EL[facility]/MEDQ  D:  07/30/2005  T:  07/30/2005  Job:  [id number]

## 2010-08-15 NOTE — Consult Note (Signed)
[name], [name]                   ACCOUNT NO.:  [id number]   MEDICAL RECORD NO.:  [id number]          PATIENT TYPE:  INP   LOCATION:  A310                          FACILITY:  [facility]   PHYSICIAN:  Kofi A. Gerilyn Pilgrim, M.D. DATE OF BIRTH:  [birth year]   DATE OF CONSULTATION:  DATE OF DISCHARGE:                                   CONSULTATION   REASON FOR CONSULTATION:  Headache.   HISTORY OF PRESENT ILLNESS:  This patient is a 45 year old Caucasian female  who apparently has had a history of asthma.  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  The patient is being  followed by infectious disease.  She presents with acute epigastric and left  upper quadrant pain which is being evaluated.  This has been associated with  nausea and vomiting.  The patient apparently has a baseline history of  migraine headaches over the last several weeks.  She has had some unusual  headaches, however.  She also has a history of cervical degenerative disk  disease which apparently appeared to cause spinal cord compression which  resulted in severe neck pain and right upper extremity pain and numbness.  Patient underwent anterior cervical diskectomy a year ago and responded very  well.  However, over the past several weeks she has had recurrent neck pain  and right upper extremity paresthesias.  The symptoms appear episodic than  chronic.  They have been associated with headache located in the occipital  region and radiates in the parasagittal region anteriorly.  The patient saw  her spine surgeon in the earlier part of this month because of these  symptoms and had the cervical spine MRI which apparently was unrevealing for  any new problem requiring intervention.  However, the patient continues to  have recurrent symptoms and hence this consultation.   PAST MEDICAL HISTORY:  Chronic asthma, atypical pneumonia with Mycobacterium  avium, intracellulare complex,  depression, migraines, degenerative disk  disease of the cervical spine.  The patient is status post hysterectomy and  her cervical diskectomy and fusions.  Appendectomy and reduction of lower  jaw at age 54.  She is also status post cholecystectomy and partial  hysterectomy.  She had EGD due to chronic GERD.  History negative for  colorectal cancer.   SOCIAL HISTORY:  She has 5 children and she is married.  No alcohol or  tobacco use.   PHYSICAL EXAMINATION:  GENERAL: Shows a mildly overweight very pleasant lady  in no acute distress.  VITAL SIGNS:  Temperature 97.8, pulse 78, respiration 18, blood pressure  125/80.  HEENT:  Neck is supple.  Head is normocephalic, atraumatic.  ABDOMEN:  Soft.  EXTREMITIES:  No significant edema.  MENTATION:  The patient is awake and alert. Speech, language and cognition  are intact.  NEUROLOGIC:  Cranial nerve evaluation pupils are equal, round and reactive  to light and accommodation. Extraocular movements are intact. Facial muscle  strength is symmetric.  Tongue is midline.  Uvula is midline.  Shoulder  shrugs are normal.  Funduscopic examination  shows flat disk and there  appears to be no pulsations noted.  MOTOR:  Examination shows normal tone, bulk and strength.  There is no  pronator drift.  Coordination intact.  Reflexes are +2 and symmetric.  Plantar reflexes are both downgoing.   LABORATORY DATA:  MRI of the brain is obtained and appears unrevealing.  There is about 3 or 4 tiny white matter that are deep and nonspecific.  Nothing acute noted on diffuse imaging.   The cervical spine MRI is also reviewed and appears unrevealing for any  surgical lesions.  She had her fusion noted.  There is no spinal cord  compression or significant foraminal stenosis noted.   IMPRESSION:  Persistent headaches with neck pain and right upper extremity  symptoms.  Imaging of the brain seems unrevealing and cervical spine MRI  also seems relatively  unrevealing for any surgical problems.  Her symptoms  may be due to residual effect from cervical disk disease.   RECOMMENDATIONS:  Trial of Neurontin.  If this is not helpful, consider 1-2  week course of prednisone.   Thank you for this consultation.      Kofi A. Gerilyn Pilgrim, M.D.  Electronically Signed     KAD/MEDQ  D:  11/18/2005  T:  11/18/2005  Job:  [id number]

## 2010-08-15 NOTE — Discharge Summary (Signed)
[name], [name]                   ACCOUNT NO.:  [id number]   MEDICAL RECORD NO.:  [id number]          PATIENT TYPE:  INP   LOCATION:  A217                          FACILITY:  [facility]   PHYSICIAN:  Madelin Rear. Sherwood Gambler, MD  DATE OF BIRTH:  09/19/1965   DATE OF ADMISSION:  05/10/2006  DATE OF DISCHARGE:  02/14/2008LH                               DISCHARGE SUMMARY   DISCHARGE SUMMARY   DISCHARGE DIAGNOSES:  Atypical chest pain, probably secondary to GI  source.  Asthma.  Hyperlipidemia.  History of migraines.  Gastroenteritis.   DISCHARGE MEDICATIONS:  Zocor 10 mg p.o. q.h.s.  Celexa 40 mg p.o. q.h.s.  Protonix 40 mg p.o. b.i.d.  Imodium p.r.n.   SUMMARY:  Patient was admitted with nausea, vomiting, diarrhea and  transaminitis.  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  She was  admitted with atypical chest pain as well as aggressive gastroenteritis  infection which improved with IV hydration and symptomatic therapy.  She  had an incidental minimally elevated troponin which prompted cardiology  consultation.  GI was also consulted.  She improved and was discharged  hemodynamically clinically stable for follow-up in office.      Madelin Rear. Sherwood Gambler, MD  Electronically Signed     LJF/MEDQ  D:  05/21/2006  T:  05/21/2006  Job:  [id number]

## 2010-08-15 NOTE — Procedures (Signed)
NAMECORINDA, [name]                   ACCOUNT NO.:  [id number]   MEDICAL RECORD NO.:  [id number]          PATIENT TYPE:  INP   LOCATION:  A217                          FACILITY:  [facility]   PHYSICIAN:  Dani Gobble, MD       DATE OF BIRTH:  [birth year]   DATE OF PROCEDURE:  05/12/2006  DATE OF DISCHARGE:                                ECHOCARDIOGRAM   INDICATIONS:  A 45 year old female admitted with nausea, vomiting and  chest discomfort referred for evaluation of LV function.   Technical quality of the study is adequate.   The aorta measures normally at 3.1 cm.   The left atrium is mildly dilated and measured 4.6 cm.  The patient  appeared to be in sinus rhythm during this procedure.   The interventricular septum and posterior wall are mildly thickened.   The aortic valve leaflets are not well visualized but it appeared to be  grossly structurally normal with normal leaflet opening.  No aortic  insufficiency is noted.  Doppler interrogation of aortic valve is within  normal limits.   The mitral valve appears structurally normal.  No mitral valve prolapse  is noted.  Mild mitral regurgitation is noted.  Doppler interrogation of  the mitral valve is within normal limits.   The pulmonic valve appears grossly structurally normal.   The tricuspid valve also appears grossly structurally normal with mild  tricuspid regurgitation noted.   The left ventricle is normal in size.  Overall left ventricular systolic  function appears to be at the lower limits of normal to mildly depressed  with an estimated ejection fraction of 45-55%.  No definitive regional  wall motion abnormalities are noted.   The right atrium and right ventricle are normal in size and right  ventricular systolic function also appears to be normal.  There is mild  right ventricular hypertrophy.   IMPRESSION:  1. Mild left atrial enlargement.  2. Mild concentric left ventricular hypertrophy.  3. Mild mitral and  tricuspid regurgitation.  4. Normal left ventricular size with low normal to mildly reduced      ejection fraction estimated at 45-55% without definitive regional      wall motion abnormality noted.           ______________________________  Dani Gobble, MD     AB/MEDQ  D:  05/12/2006  T:  05/12/2006  Job:  [id number]   cc:   Madelin Rear. Sherwood Gambler, MD  Fax: [phone]

## 2010-08-15 NOTE — Consult Note (Signed)
[name], [name]                   ACCOUNT NO.:  [id number]   MEDICAL RECORD NO.:  [id number]          PATIENT TYPE:  INP   LOCATION:  A217                          FACILITY:  [facility]   PHYSICIAN:  Lionel December, M.D.    DATE OF BIRTH:  1965-12-17   DATE OF CONSULTATION:  05/11/2006  DATE OF DISCHARGE:                                 CONSULTATION   REASON FOR CONSULTATION:  Nausea, vomiting, diarrhea, elevated LFTs.   REFERRING PHYSICIAN:  Madelin Rear. Fusco, MD   HISTORY OF PRESENT ILLNESS:  The patient is a 45 year old Caucasian  female who was admitted with acute onset nausea, vomiting, diarrhea of  less than 24 hours duration. She also has complained of chest pressure.  Cardiology consult is pending.  She has history of chronic asthma.  She  denies any abdominal pain, melena, or rectal bleeding.  Today she has  had no vomiting or diarrhea, and her nausea is improving.  She is  tolerating clear liquids.  She was febrile last night with a temperature  of 102.4.  She has been afebrile today.   She had LFTs yesterday with an elevation of alkaline phosphatase at 132.  Her AST was 84,  ALT 82.  D-dimer was 0.92.  CBC unremarkable.  Troponin was slightly  elevated at 0.1 today.  Chest x-ray revealed mild chronic bronchitic  changes.  CT chest angiogram was negative for pulmonary embolus.  Abdominal ultrasound revealed CBD diameter of 6 mm and gallbladder  absent.  Otherwise no acute findings.   MEDICATIONS AT HOME:  1. Zocor 10 mg nightly.  2. Celexa 40 mg daily.   ALLERGIES:  1. PREDNISONE causes psychotic reaction and rash.  2. PEANUTS.  3. PRILOSEC causes rash.   PAST MEDICAL HISTORY:  1. Chronic asthma.  2. History of atypical pneumonia with microbacterium avium      intracellulare complex in 2007.  3. Depression.  4. Migraines.  5. Chronic GERD.  6. She had a colonoscopy in 2002 reported to be normal.  7. Cholecystectomy in 2002.  8. Partial hysterectomy in 2003  followed by bilateral salpingo-      oophorectomy with bladder suspension in 2007.  9. Neck surgery.  10.Appendectomy.  11.Reduction of lower jaw at age 45.  54.EGD in August 2007 during hospitalization revealed noncritical      Schatzki ring which was not manipulated.  She had gall-stained      gastric mucosa with fish scaling.   FAMILY HISTORY:  Negative for colorectal cancer.  Mother died of unknown  type cancer.  Father has diabetes, heart disease, hypertension.   SOCIAL HISTORY:  She is married with 5 children.  She is currently  raising her 34-year-old grandson.  Denies any tobacco or alcohol use.   REVIEW OF SYSTEMS:  See HPI for GI.  She complains of shortness of  breath associated with her chest pressure, complains of cough but no  production of sputum.   PHYSICAL EXAMINATION:  VITAL SIGNS: T-max 102.4, currently 97.  Pulse  86, respiratory rate 20, blood pressure 113/62, O2 saturation 99%.  Weight 101 kg.  GENERAL:  Pleasant Caucasian female in no acute distress.  She is  resting comfortably.  SKIN:  Warm and dry, no jaundice.  HEENT:  Sclerae nonicteric.  Oral mucosa moist and pink.  CHEST: Clear to auscultation.  CARDIAC: Regular rate and rhythm.  No murmurs, rubs, or gallops.  ABDOMEN:  Positive bowel sounds.  Obese, symmetrical, soft, nontender.  No organomegaly or masses.  No rebound tenderness or guarding.  No  abdominal bruits or hernias.  EXTREMITIES: No edema.   LABORATORY DATA:  White count 6500, hemoglobin 12.9, platelets 273,000.  Sodium 139, potassium 4.1, BUN 20, creatinine 0.83.  Lipase 15.  Other  labs as mentioned in HPI.   IMPRESSION:  The patient is a 45 year old lady with acute onset nausea,  vomiting, diarrhea, and fever which is resolving.  Suspect viral  gastroenteritis.  She also has elevated liver function tests of unclear  etiology.  Back in August 2007, her LFTs were normal, but she had a bump  in her ALT to around 50 at that time.  It is  unclear if this is a drug  reaction, possibly related to Zocor, versus her acute illness.  At least  on ultrasound, there is no evidence of biliary dilatation or abnormality  to liver.  Viral hepatitis panel is pending.   RECOMMENDATIONS:  1. Continue supportive measures.  2. Follow up hepatitis panel.  3. Recheck LFTs in the morning.      Tana Coast, P.A.      Lionel December, M.D.  Electronically Signed    LL/MEDQ  D:  05/11/2006  T:  05/11/2006  Job:  [id number]   cc:   Madelin Rear. Sherwood Gambler, MD  Fax: [phone]

## 2010-08-15 NOTE — Op Note (Signed)
[facility]  Patient:    [name], [name] Visit Number: [id number] MRN: [id number]          Service Type: [facility] Location: [facility] Attending Physician:  Fredirick Maudlin Dictated by:   Duane Lope, M.D. Proc. Date: 05/19/01 Admit Date:  12/21/2000 Discharge Date: 01/20/2001                             Operative Report  PREOPERATIVE DIAGNOSES: 1. Hypermenorrhea. 2. Severe dysmenorrhea. 3. Dyspareunia.  POSTOPERATIVE DIAGNOSES: 1. Hypermenorrhea. 2. Severe dysmenorrhea. 3. Dyspareunia. 4. Right ovarian cyst.  PROCEDURE: 1. Vaginal hysterectomy. 2. Right ovarian cystectomy.  SURGEON:  Duane Lope, M.D.  ANESTHESIA:  General endotracheal.  FINDINGS:  Patient had very poor descent of the uterus.  She also had some anterior possibly myoma, it is difficult to tell, the stroma was just so dense of her uterus, but made it difficult to get in anteriorly.  The right ovary had a hemorrhagic corpus luteum cyst that was excised.  She had a very vascular heavy back-bleeding uterus and that was the reason for the excessive blood loss.  DESCRIPTION OF OPERATION:  Patient was taken into the operating room and placed in a supine position where she underwent general endotracheal anesthesia.  She received Ancef preoperatively as well as the Flowtrons were in place at the time of induction of anesthesia.  The patient was placed in dorsal lithotomy position in candy-cane stirrups.  She was prepped and draped in the usual sterile fashion and a Foley catheter was placed for continuous bladder drainage.  A weighted speculum was placed, a Sims retractor placed and the cervix grasped with two thyroid tenaculums.  A circumferential incision was made about the cervix with the electrocautery unit and the anterior and posterior vagina was pushed off the lower uterine segment respectively.  The posterior cul-de-sac was entered sharply.  Uterosacral ligaments were clamped, cut,  transfixion-suture-ligated and held bilaterally.  The cardinal ligaments were also clamped, cut and suture-ligated and cut.  Serial pedicles were taken up the cervix, each time pushing the anterior vagina off the lower uterine segment.  Special attention was paid to getting the posterior peritoneum with each bite.  I finally was able to get in anteriorly; again, it was difficult because of the anterior fullness or myoma, again, I am not sure which.  The anterior and posterior leaves of the broad ligament were then plicated and the uterine vessels were clamped bilaterally.  They were cut and suture-ligated with good hemostasis.  I then took one more pedicle above the uterine vessels and then did a morcellation of the uterus.  There was great deal of back-bleeding, evidently the source being the uterine infundibulopelvic ligament, but she had a lot of bleeders within the myometrium.  I then bivalved the remaining specimen and the right uteroovarian ligament was cross-clamped.  The remainder of the myometrium was removed on this side and double suture-ligation was performed with good hemostasis.  The left side was then heaviest bleeding side and really there was just a lot of clot and a lot of active bleeding from small back-bleeders.  The uteroovarian ligament on the right was cross-clamped and double suture-ligated, with the rest of the myometrium being removed.  Both of these pedicles were hemostatic.  There was a right ovarian cyst; this was incised with electrocautery unit and the cyst wall removed, however, it continued to bleed.  A running 3-0 Monocryl suture was  placed for hemostasis and two large Hemoclips also placed.  All the pedicles were reviewed and found to be hemostatic.  There was an area on the left that a figure-of-eight suture was placed for additional hemostasis.  The peritoneum was closed in a pursestring fashion, once all pedicles were found to be hemostatic and a great  deal of pelvic irrigation was performed.  The anterior vagina, anterior peritoneum, posterior peritoneum and posterior vagina were closed in an anterior-to-posterior fashion and in an interrupted fashion for vaginal closure and there was good hemostasis.  The held uterosacral ligaments were then tied together in the middle for additional vaginal support.  The patient tolerated the procedure well.  She experienced 500 cc of blood loss intraoperatively.  She was taken to recovery room in good and stable condition.  All needle, instrument and sponge counts were correct x3. Dictated by:   Duane Lope, M.D. Attending Physician:  Fredirick Maudlin DD:  05/19/01 TD:  05/20/01 Job: [id number]

## 2010-08-15 NOTE — H&P (Signed)
NAMEMeribeth, Odom Odom                   ACCOUNT NO.:  [id number]   MEDICAL RECORD NO.:  [id number]          PATIENT TYPE:  INP   LOCATION:  A217                          FACILITY:  [facility]   PHYSICIAN:  Madelin Rear. Sherwood Gambler, MD  DATE OF BIRTH:  10-15-1965   DATE OF ADMISSION:  05/10/2006  DATE OF DISCHARGE:  LH                              HISTORY & PHYSICAL   CHIEF COMPLAINT:  Vomiting and diarrhea, as well as chest pain.   HISTORY OF PRESENT ILLNESS:  The patient complains of intermittent  substernal chest pressure not associated with the active illness until  she was noted to have vomiting and diarrhea early a.m. the day of  admission.  Regarding her chest pain, she states it was not clearly  exertional; it was an unpredictable pattern, erratic.  It varied in  duration for several minutes, but never much more than 10 minutes.  She  has had some shortness of breath; however, she has a previous history of  asthma and cannot clearly relate the chest pain and shortness of breath  as being linked to each other symptomatically.  She had no palpitations  or syncope.  The thing that prompted her emergency room visitation was  severe intractable sudden onset of vomiting and diarrhea.  She denies  hematemesis, hematochezia or melena.  She had no fever, rigors or  chills.  No cough or sputum.   PAST MEDICAL HISTORY:  1. Asthma.  2. Hyperlipidemia.  3. She also has a previous history of migraines.   FAMILY HISTORY:  Unknown.   PAST SURGICAL HISTORY:  1. Status post appendectomy.  2. Status post cholecystectomy.  3. Temporomandibular joint operation.  4. Cervical disk surgery.  5. Cervical spinal fusion.   SOCIAL HISTORY:  Nonsmoker and nondrinker.  No alcohol or drug use.   ALLERGIES:  Notable for PEANUTS as well as PREDNISONE.   MEDICATIONS:  She is usually maintained on:  1. Zocor 10 mg p.o. daily.  2. Oral Celexa.   REVIEW OF SYSTEMS:  As under HPI.  Specifically, she denied any  antecedent GERD.   PHYSICAL EXAMINATION:  GENERAL:  She is moderately obese, in no acute  distress.  HEENT:  Her mucous membranes are somewhat dry.  HEAD AND NECK:  No JVD or adenopathy.  CHEST:  Clear.  CARDIAC:  Regular rhythm, tachycardic without gallop or rub.  ABDOMEN:  Soft.  No organomegaly or masses.  No guarding or rebound  tenderness.  EXTREMITIES:  Without clubbing, cyanosis or edema.  NEUROLOGIC:  The patient is nonfocal.   LABORATORY STUDIES:  White blood cell count was moderately elevated at  11.8 with normal H&H and platelet count.  She had a mild left shift with  94% polymorphonuclear leukocytes.  Her CMET was unrevealing, except for  mild elevated blood sugar at 132.  Renal function was normal.  LFTs  revealed a moderate elevation of her AST to 84 with an upper limit on  the labs of 37.  Cardiac enzymes and troponin are pending at the present  time.   Electrocardiogram revealed sinus  tachycardia, left anterior fascicular  block with left axis deviation, otherwise unremarkable with specifically  no acute ischemia or infarctive changes; this was obtained during active  chest discomfort.   IMPRESSION:  1. Gastroenteritis with vomiting and diarrhea, intractable:  She has      evidence clinically of mild dehydration.  I will admit her and      continue her intravenous hydration.  Due to the slightly abnormal      liver function tests, which could be secondary to her statin drug      use as well as early hepatitis, we will screen her with these      studies; specifically, hepatitis A, B and C serology will be      obtained.  We will hold her statin drug and monitor.  It is also      possible to have a common bile duct stone, so we will evaluate her      further electively, ultrasound to rule out common bile duct stone.      Stool studies were obtained to rule out bacterial cause of      gastroenteritis, for example, Salmonella, Shigella and      Campylobacter as well  as Clostridium difficile.  2. Chest pain:  Female, positive cardiac risk factors.  We will obtain      serial cardiac enzymes.  In light of the normal cardiogram during      active symptoms, unlikely to be cardiac, more likely to be      esophageal spasm.  We will administer Valium in an attempt to      ameliorate that.  She will be maintained on nothing-by-mouth to be      followed by clear liquids and advance the diet as tolerated,      pending the completion of other studies.  3. Migraine headaches:  As-needed intervention with analgesia and      expectant observation.  4. Hyperlipidemia:  Please see notes above.      Madelin Rear. Sherwood Gambler, MD  Electronically Signed     LJF/MEDQ  D:  05/10/2006  T:  05/11/2006  Job:  [id number]

## 2010-08-15 NOTE — H&P (Signed)
NAMEMeggen, Spaziani Odom                   ACCOUNT NO.:  [id number]   MEDICAL RECORD NO.:  [id number]          PATIENT TYPE:  AMB   LOCATION:  DAY                           FACILITY:  [facility]   PHYSICIAN:  Tilda Burrow, M.D. DATE OF BIRTH:  April 13, 1965   DATE OF ADMISSION:  09/10/2005  DATE OF DISCHARGE:  LH                                HISTORY & PHYSICAL   ADMISSION DIAGNOSES:  1.  Dyspareunia, suspected ovarian contact during intercourse.  2.  Cystocele.  3.  Rectocele.   HISTORY OF PRESENT ILLNESS:  This 45 year old female was admitted at this  time for a laparoscopic removal of both tubes and ovaries followed by  anterior and posterior repair.  Coti is now 4-1/2 years status post vaginal  hysterectomy, right ovarian cystectomy.  She has significant dyspareunia.  She also has urinary symptoms of evaluated by Dr. Despina Hidden.  Empiric therapy  with Ditropan XL 15 at night resulted in dramatic improvement.  Since the  picture is a mixed picture of stress and urge incontinence, it is felt that  a sling procedure is not warranted.  She will be continued, therefore, on  Ditropan for urinary tract symptoms.   Furthermore, the patient has the dyspareunia which occurs when vaginal apex  is contacted.  Transvaginal ultrasound has shown that the right ovary is  close to the vaginal apex and has had prior surgery done on it at the time  of hysterectomy.  It is suspected that the right ovary is attached ot the  vaginal cuff.  She has milder pain on left side.  Plans are to do a  cystocele and rectocele repair at the same time as the laparoscopic  procedure.  Technical aspects of the procedure have been explained to the  patient.  Potential for open laparotomy has been reviewed with the patient.   PAST MEDICAL HISTORY:  Benign.   PAST SURGICAL HISTORY:  1.  Partial hysterectomy May 01, 2001.  2.  Cholecystectomy 2002.  3.  Neck surgery 2006 at [facility].   GYN HISTORY:  Notable for heavy  and prolonged menses and painful intercourse  which predated her hysterectomy.   ALLERGIES:  PREDNISONE and PRILOSEC.   PHYSICAL EXAMINATION:  VITAL SIGNS: Weight 224, blood pressure 128/86.  HEENT: Pupils equal and reactive.  Extraocular intact.  NECK: Supple.  CHEST: Clear to auscultation.  ABDOMEN: Moderately obese without mass.  PELVIC: External genitalia normal.  Cystocele and rectocele present. Vaginal  apex adequately supported.   Ultrasound above the apex shows right ovary is the source of discomfort upon  the vaginal apex contact.  Furthermore, the left ovary is visible not quite  as close to the vaginal cuff and closed.   MEDICATIONS:  Erythromycin and Celexa with allergies to PREDNISONE and  PRILOSEC.  Suspected allergy is to the capsule dyes rather than the  medicines themselves.   PLAN:  Laparoscopic BSO combined with anterior and posterior repair on September 10, 2005.      Tilda Burrow, M.D.  Electronically Signed     JVF/MEDQ  D:  09/07/2005  T:  09/07/2005  Job:  [id number]   cc:   [facility]

## 2010-08-15 NOTE — H&P (Signed)
[name], [name]                   ACCOUNT NO.:  [id number]   MEDICAL RECORD NO.:  [id number]          PATIENT TYPE:  INP   LOCATION:  A310                          FACILITY:  [facility]   PHYSICIAN:  Patrica Duel, M.D.    DATE OF BIRTH:  02-16-1966   DATE OF ADMISSION:  11/16/2005  DATE OF DISCHARGE:  LH                                HISTORY & PHYSICAL   CHIEF COMPLAINT:  Abdominal pain.   HISTORY OF PRESENT ILLNESS:  This is a 45 year old female with a history of  asthma and atypical pneumonia.  She had a culture positive for Mycobacterium  Avium intra-cellularity and is being followed by infectious disease and is  treated thrice weekly ethambutol as well as Zithromax.   The patient's history is also significant for mild depression which is  controlled, a history of migraine headaches with no recent recurrence and  history of pneumonia in the past.  She underwent cholecystectomy in 2002 and  is status post partial hysterectomy in the past with complete hysterectomy  and bladder suspension, which was repaired by Dr. Emelda Fear in June of this  year, post-op course unremarkable.  She also had neck surgery in 2006 at  [facility].   The patient presented to the emergency department with a several-hours  history of quite intense epigastric and left upper quadrant abdominal pain.  She had mild nausea but no hematemesis, diarrhea, melena or hematochezia.  She also has had no fever or chills.   In the emergency department, the patient's vital signs were within normal  limits.  Her laboratory which included a CBC, a comp 12 and lipase were  unremarkable, except for a glucose of 117.  Urinalysis clear.  A CT scan was  obtained, showed scattered sub-centimeter mesenteric and right lower  quadrant mesenteric lymph nodes which was nonspecific and possibly reactive  and post-op changes.  The appendix was noted to be surgically absent.  Pelvic CT unremarkable.   The patient was discharged  earlier in the day on Augmentin therapy, as well  as symptomatic therapy.  She developed nausea and vomiting after taking the  medication and came back to the hospital.  Admission was appropriate, given  her failure to respond to ongoing symptoms.   Their is no history of recurring headache, neurologic deficits, chills,  fever, chest pain, shortness of breath or recurring cough or genitourinary  symptoms.  Since beginning the ethambutol and Zithromax, the patient has  felt very well and tolerated the medication (x3 months).   CURRENT MEDICATIONS:  Include:  1. Citalopram 40 mg q.d.  2. Midrin p.r.n.  3. Oxybutynin p.r.n.  4. Tussionex p.r.n.  5. Ethambutol.  6. Zithromax as noted.   ALLERGIES:  PEANUTS AND PREDNISONE.  She also relates intolerance to  possibly Prilosec, possibly relating to the dyes, other than the meds  themselves.   PAST HISTORY:  As noted above.  She is also status post appendectomy.   SOCIAL HISTORY:  Nonsmoker, non-drinker.   FAMILY HISTORY:  Noncontributory.   REVIEW OF SYSTEMS:  Negative except as mentioned.  PHYSICAL EXAMINATION:  GENERAL:  A very pleasant female, currently is alert,  oriented in no acute distress.  VITAL SIGNS:  Upon presentation, temp 97.7, pulse 80, respirations 20, BP  126/70, O2 sat 100% on room air.  HEENT:  Normocephalic, atraumatic.  There is no scleral icterus.  Ears,  eyes, throat benign, well-hydrated.  NECK:  Supple without bruits, thyromegaly, lymphadenopathy or masses.  LUNGS:  Clear to A&P.  HEART:  Sounds are normal.  ABDOMEN:  Reveals mild tenderness in the epigastric region, as well as the  left upper quadrant.  There is no other tenderness, masses, guarding,  rebound or organomegaly noted.  Bowel sounds are normal.  No CVA tenderness.  EXTREMITIES:  No clubbing, cyanosis, edema.  NEUROLOGIC:  Exam is within normal limits.   ASSESSMENT:  1. Abdominal pain, questionable etiology.  2. Mesenteric adenitis to a  mild degree, documented by CT scanning with      perfectly normal labs at this point.  3. Questionable viral symbol or other.   PLAN:  Admit for pain control, hydration and close observation.  Will have  GI see the patient if her symptoms persist through the night, currently she  is stable and feeling somewhat better.      Patrica Duel, M.D.  Electronically Signed     MC/MEDQ  D:  11/16/2005  T:  11/16/2005  Job:  [id number]

## 2010-08-15 NOTE — Discharge Summary (Signed)
[facility]  Patient:    [name], [name] Visit Number: [id number] MRN: [id number]          Service Type: SUR Location: 4A A426 01 Attending Physician:  Lazaro Arms Dictated by:   Duane Lope, M.D. Admit Date:  05/19/2001 Discharge Date: 05/20/2001                             Discharge Summary  NO DICTATION Dictated by:   Duane Lope, M.D. Attending Physician:  Lazaro Arms DD:  05/21/01 TD:  05/21/01 Job: [id number]

## 2010-08-15 NOTE — H&P (Signed)
NAMEAVON, [name]                   ACCOUNT NO.:  [id number]   MEDICAL RECORD NO.:  [id number]          PATIENT TYPE:  INP   LOCATION:  A215                          FACILITY:  [facility]   PHYSICIAN:  Patrica Duel, M.D.    DATE OF BIRTH:  1965/09/03   DATE OF ADMISSION:  07/27/2005  DATE OF DISCHARGE:  LH                                HISTORY & PHYSICAL   CHIEF COMPLAINT:  Cough and fever.   HISTORY OF PRESENT ILLNESS:  This is a 45 year old female with a  longstanding history of asthma.  She also has depression which has been  controlled.  Surgical history is significant for hysterectomy.  She also has  migraine headaches.  There is a history of pneumonia in the remote past.   The patient presented to the emergency department for evaluation of a 1-week  history of increasingly severe cough and some wheezing.  She was treated  with Levaquin and albuterol and discharged.  She was instructed to return  should she not improve.   The patient presented back to the emergency department for evaluation and  was found to be febrile.  A chest x-ray revealed diffuse peribronchial  thickening with questionable right upper lobe infiltrate without  consolidation.   The patient's white count was 9100, differential essentially normal.  Arterial blood gas:  PCO2 29, pH 7.52, PO2 69 on room air.  MET-7  significant for sodium of 130, potassium 3.3.  D-dimer elevated at 0.69.  There was no history of hemoptysis, syncope, palpitations, headache,  neurologic deficits, nausea, vomiting, diarrhea, melena, hematemesis or  hematochezia.   The patient was admitted with apparent pneumonia/asthmatic bronchitis, as  well as mild electrolyte disturbances as noted.   CURRENT MEDICATIONS:  1.  Citalopram 40 mg daily.  2.  Midrin p.r.n.  3.  Oxybutynin chloride p.r.n.  4.  Tussionex p.r.n.  5.  Levaquin (begun yesterday).   ALLERGIES:  PEANUTS and PREDNISONE.   PAST HISTORY:  As noted.   FAMILY HISTORY:   Noncontributory.   REVIEW OF SYSTEMS:  Review of systems is negative except as mentioned in  physical examination.   PHYSICAL EXAMINATION:  GENERAL:  This is a very pleasant female who is  feeling somewhat better at the time of my evaluation.  She is fully alert  and respiratory status seems stable.  VITAL SIGNS:  Presenting temperature 102.2 degrees, BP 131/76, pulse 128,  respirations 22, O2 SAT 95% on room air.  HEENT:  Normocephalic, atraumatic.  Pupils are equal.  Ears, nose and throat  benign.  NECK:  Supple without bruits, thyromegaly or lymphadenopathy.  LUNGS:  Lungs reveal scattered rhonchi and end-expiratory wheezes.  There is  no focal rales.  HEART:  Heart sounds are normal without murmurs, rubs, or gallops.  ABDOMEN:  Nontender and non-distended.  Bowel sounds are intact.  EXTREMITIES:  No clubbing, cyanosis or edema.  Homans sign is negative.  NEUROLOGIC:  Exam is normal.   ASSESSMENT:  1.  Pneumonia/asthmatic bronchitis.  2.  Elevated D dimer of questionable significance; this is associated with  the above mild hypoxia and respiratory alkalosis, need to rule out      pulmonary embolism.  3.  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   PLAN:  Admit for pulmonary toilet, IV antibiotics and support.  We will  obtain a CT scan of the chest and follow and treat expectantly.      Patrica Duel, M.D.  Electronically Signed     MC/MEDQ  D:  07/28/2005  T:  07/28/2005  Job:  [id number]

## 2010-08-15 NOTE — Op Note (Signed)
NAMEVIKI, CARRERA                   ACCOUNT NO.:  [id number]   MEDICAL RECORD NO.:  [id number]          PATIENT TYPE:  OBV   LOCATION:  A428                          FACILITY:  [facility]   PHYSICIAN:  Tilda Burrow, M.D. DATE OF BIRTH:  1966/02/22   DATE OF PROCEDURE:  09/10/2005  DATE OF DISCHARGE:                                 OPERATIVE REPORT   PREOPERATIVE DIAGNOSIS:  Dyspareunia, suspect ovarian contact during  intercourse, cystocele, rectocele.   POSTOPERATIVE DIAGNOSIS:  Dyspareunia, suspect ovarian contact during  intercourse, rectocele.   PROCEDURE:  Laparoscopic bilateral salpingo-oophorectomy, posterior repair.   SURGEON:  Tilda Burrow, M.D.   ASSISTANTAmie Critchley, CST.   ANESTHESIA:  General.   COMPLICATIONS:  Postoperative oozing from vaginal incision necessitating  vaginal packing to be reinforced in the recovery room.   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   DETAILS OF PROCEDURE:  The patient was taken to the operating room, prepped  for combined abdominal and vaginal procedure.  The infraumbilical vertical  incision was opened, a Veress needle introduced through the umbilicus,  pneumoperitoneum achieved easily after water droplet technique used to  confirm intraperitoneal location.  The laparoscopic trocar was introduced  under direct visualization and then a trocar was placed suprapubically in  each lower quadrant under direct visualization.  A 12 mm trocar was placed  at the umbilicus.  Attention was then directed to the pelvis.  Trendelenburg  position was used, the bowel elevated, epiploic fat attachments to the  vaginal cuff were then cut free easily and sharply.  Both ovaries were  identified and the tube and ovary complexes were adherent to the pelvic  floor.  Round ligaments were identified where they disappeared into the cuff  adhesions.  The  harmonic scalpel was placed in the right lower quadrant and  using a laparoscopic Babcock grasping device through the umbilical site, we  proceeded to identify the adnexal structures, I placed the tube and ovary on  the right side in the under traction and used the harmonic scalpel to  dissect along and resect the infundibulopelvic ligament, the round ligament  pedicle, and take the ovarian adhesions free to remove the tube and ovary on  the right.  We had been able to identify the ureter deep in the pelvis well  out of the area of surgery prior to doing this procedure.  On the opposite  side, a similar technique was used.  The specimens were placed in the cul-de-  sac.  A laparoscopic EndoCatch bag was placed through the suprapubic site  and then the specimens was removed.  The pelvis had minimal bleeding during  this process.  Saline irrigation of the pelvis was performed and then the  abdominal portion of the procedure completed by deflation of the abdomen  which was followed by fascial closure with 0 Vicryl and then staple closure  of the skin that closed all four ports.   Posterior vaginal repair was then performed.  On inspection with  the patient  in the supine position, it was felt that addressing the bladder at this time  would not be necessary.  Of significance was that bladder adhesions had been  noted to the adnexal structures and taking those off felt would benefit some  of her pressure symptoms over the bladder.  Anatomic support appeared  adequate from the vaginal angle.   The posterior repair was then performed by splitting the posterior vaginal  mucosa along the posterior fourchette, undermining the vaginal mucosa after  infiltrating it with dilute Marcaine with epinephrine.  This was then  improved by dissecting laterally to identify a good pararectal tissue on  either side that could be pulled in the midline.  Digital rectal exam with a  separate protected double gloved  right index finger was then performed and  the Allis clamps used to grasp supportive tissues that could be pulled in  the midline.  These were pulled in the midline with a series of five  interrupted 0 Dexon sutures which were tagged.  After regloving and  maintaining sterile technique, these were tied down.  This resulted in  significantly improved mid pelvic floor support.  The perineal body was then  built up further with four additional sutures interrupted 0 Dexon and the  vaginal mucosa trimmed and reapproximated with interrupted sutures of 2-0  chromic.  Vaginal packing was lightly placed with Betadine-soaked gauze and  then the patient allowed to go to the recovery room in good condition.  A  Foley catheter was in place.  The patient had estimated blood loss  interoperatively of approximately 100 mL.  In the recovery room, she was  noted to have some clots per vagina, these stopped spontaneously, but the  vaginal packing was reinforced as a precaution. The patient went to the  recovery room and will be observed overnight.      Tilda Burrow, M.D.  Electronically Signed     JVF/MEDQ  D:  09/10/2005  T:  09/10/2005  Job:  [id number]

## 2010-08-15 NOTE — Consult Note (Signed)
NAME:  [name], [name]                   ACCOUNT NO.:  [id number]   MEDICAL RECORD NO.:  [id number]          PATIENT TYPE:  INP   LOCATION:  A310                          FACILITY:  [facility]   PHYSICIAN:  R. Roetta Sessions, M.D. DATE OF BIRTH:  06/22/1965   DATE OF CONSULTATION:  11/18/2005  DATE OF DISCHARGE:                                   CONSULTATION   REASON FOR CONSULTATION:  Abdominal pain.   REQUESTING PHYSICIAN:  Dr. 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  Sunday her stomach started feeling a little uneasy.  She noted when she ate she felt extremely full.  During the evening her  discomfort progressed.  She went to sleep and woke up in the middle of the  night around 2 a.m. with acute onset severe epigastric/left upper quadrant  abdominal pain which doubled her over.  After one hour she drove herself to  the emergency department, was evaluated.  She had an abdominal ultrasound  which was essentially negative status post cholecystectomy.  She had a CT of  the abdomen and pelvis which revealed scattered subcentimeter mesenteric and  right lower quadrant mesenteric lymphadenopathy of unclear significance.  Laboratories were essentially normal.  She was given morphine during the  evening and eventually sent home with symptomatic therapy and antibiotic  which possibly was Augmentin.  She came back later in the day with recurrent  severe abdominal pain which again woke her up from her sleep and nausea and  vomiting.  Her white count has remained normal with last one of 6900,  hemoglobin 12.3.  One set of LFTs were normal as well as a normal lipase.  Urinalysis was negative.  Patient continued to have  abdominal pain.  She  vomited several times during the night.  She denies any change in her bowel  movements.  She had a regular bowel movement on Monday.  No melena or bright  red blood per rectum.  Denies any diarrhea, constipation.  She does have  chronic GERD, but does not take anything for it.  She previously tried  Prilosec, but developed a rash felt to be due to the dye in the capsule.   HOME MEDICATIONS:  1. Celexa 40 mg daily.  2. Detrol 15 mg daily.  3. ProAir p.r.n.  4. Midrin p.r.n.  5. Ethambutol.  6. Zithromax three times a week monitored by infectious disease.  7. She also has used Epipen she uses as needed for peanut allergies.   ALLERGIES:  PREDNISONE caused psychotic reaction and also a rash.  She has  an allergy to PEANUTS.  PRILOSEC caused a rash felt to be due to the dye.   PAST MEDICAL HISTORY:  1. Chronic asthma.  2. History of atypical pneumonia with positive cultures from mycobacterium      avium intracellulare complex  followed by infectious disease.  Has      undergone three months of ethambutol and Zithromax therapy three times      a week.  3. She has mild depression.  4. History of migraine headaches.  5. Chronic GERD with EGD several years ago by Dr. Jena Gauss.  Had her      esophagus stretched at that time.  She had a prior colonoscopy in 2002      which she reports to be normal.  6. She had cholecystectomy in 2002.  7. Partial hysterectomy initially in 2003 followed by bilateral salpingo-      oophorectomy with bladder suspension in June 2007.  8. She had plate put in her neck in 2006.  9. She has had an appendectomy in remote past for appendicitis.  10.She had reduction of the lower jaw at age 45.   FAMILY HISTORY:  Negative for colorectal cancer.  Mother died of unknown  type cancer.   SOCIAL HISTORY:  She has five children.  She is raising her 30-year-old  grandson.  She is married.  She currently stays at home with her grandson.  No tobacco or  alcohol use.   REVIEW OF SYSTEMS:  See HPI for GI.  CARDIOPULMONARY:  Denies any chest pain  or shortness of breath at this time.   PHYSICAL EXAMINATION:  VITAL SIGNS:  Temperature 97.7, pulse 86,  respirations 18, blood pressure 132/92, weight 209.  GENERAL:  Pleasant, obese Caucasian female who is quite emotional.  SKIN:  Warm and dry.  No jaundice.  HEENT:  Sclerae non-icteric.  Oropharyngeal mucosa moist and pink.  No  lesions, erythema, or exudate.  No lymphadenopathy, thyromegaly.  CHEST:  Lungs are clear to auscultation.  CARDIAC:  Regular rate and rhythm.  Normal S1, S2.  No murmurs, rubs, or  gallops.  ABDOMEN:  Positive bowel sounds.  Obese, but symmetrical.  Soft.  She has  moderate epigastric and left upper quadrant tenderness to deep palpation  with subjective guarding.  No rebound tenderness.  No organomegaly or  masses.  No abdominal bruits or hernias.  EXTREMITIES:  No edema.   LABORATORIES:  As mentioned in HPI.  In addition, platelets 258,000.  Sodium  142, potassium 3.8, BUN 7, creatinine 0.7, glucose 85, total bilirubin 0.6,  alkaline phosphatase 61, AST 31, ALT 27, albumin 3.5, lipase 20.   IMPRESSION:  Patient is a 45 year old lady with acute onset epigastric/left  upper quadrant abdominal pain of unclear etiology.  CT suggestive of mild  mesenteric adenitis in the right lower quadrant region which is possibly  incidental finding.  She has chronic gastroesophageal reflux disease which  she is not on any medications for.  Etiology of abdominal pain is unclear.  She may need a diagnostic EGD for further evaluation of her symptoms.   RECOMMENDATIONS:  1. Will discuss further with Dr. Jena Gauss regarding possible EGD.  2. Agree with IV PPI for therapy.  3. Will add LFTs, amylase, lipase, to today's laboratories.  4. Further recommendations to follow.      Tana Coast, P.AJonathon Bellows, M.D.  Electronically Signed    LL/MEDQ  D:  11/18/2005  T:   11/18/2005  Job:  [id number]

## 2010-08-15 NOTE — Discharge Summary (Signed)
NAMECHIMENE, SALO                   ACCOUNT NO.:  [id number]   MEDICAL RECORD NO.:  [id number]          PATIENT TYPE:  INP   LOCATION:  A341                          FACILITY:  [facility]   PHYSICIAN:  Patrica Duel, M.D.    DATE OF BIRTH:  [birth year]   DATE OF ADMISSION:  07/27/2005  DATE OF DISCHARGE:  05/04/2007LH                                 DISCHARGE SUMMARY   DISCHARGE DIAGNOSES:  1.  Pneumonia, atypical.  AFB smears and skin tests benign.  Good response      to antimicrobial therapy.  2.  Steroid intolerance and peanut allergy.  3.  Longstanding history of asthma.  4.  Mild depression, controlled.  5.  Status post hysterectomy.  6.  History of migraine headaches, no recent recurrence.  7.  History of pneumonia in the past.   For details regarding admission, please refer to the admitting noted.  Briefly, this 45 year old female with history as noted above presented to  the emergency department on the day of admission for a one week history of  increasingly severe cough and some wheezing.  She was given Levaquin and  albuterol and was discharged.  She returned back to the emergency department  for evaluation.  She had not improved.  She was febrile.  A chest x-ray  revealed diffuse peribronchial thickening and questionable right upper lobe  infiltrate without consolidation.  She had a mild respiratory alkalosis with  a pO2 of 69.  D-dimer mildly elevated.  Sodium 130, potassium 3.3.  The  patient was admitted with apparent pneumonia/asthmatic bronchitis.  Of note,  she had no history of weight loss, hemoptysis, or longstanding malaise, or  symptoms compatible with a prolonged mycobacterial illness.   COURSE IN THE HOSPITAL:  The patient was admitted to 3A and administered  empiric Levaquin.  A CT scan was obtained which was somewhat concerning for  atypical pneumonia, possible TB.  Dr. Juanetta Gosling was consulted, a skin test  applied and a smear was obtained.  The smears were negative  and the PPD is  negative after 72 hours.  Clinically, the patient is much improved.  Her  lungs are clear.  O2 sats are within normal limits.  She is stable for  discharge at this time.   DISPOSITION:   MEDICATIONS:  1.  Levaquin 500 every day x7 days.  2.  Codiclear DH.  3.  Albuterol inhaler p.r.n.  4.  Citalopram 40 every day.  5.  Midrin p.r.n.  6.  Oxybutynin p.r.n.   She will be followed and treated as an outpatient.      Patrica Duel, M.D.  Electronically Signed     MC/MEDQ  D:  07/31/2005  T:  07/31/2005  Job:  [id number]

## 2010-08-15 NOTE — Discharge Summary (Signed)
[facility]  Patient:    [name], [name] Visit Number: [id number] MRN: [id number]          Service Type: SUR Location: 4A A426 01 Attending Physician:  Lazaro Arms Dictated by:   Duane Lope, M.D. Admit Date:  05/19/2001 Discharge Date: 05/20/2001                             Discharge Summary  DISCHARGE DIAGNOSES: 1. Status post a vaginal hysterectomy. 2. Status post right ovarian cystectomy due to hemorrhagic corpus luteum    cyst on right ovary. 2. Unremarkable postoperative course. 3. Asthma.  PROCEDURE:  Vaginal hysterectomy with right ovarian cystectomy.  SURGEON:  Duane Lope, M.D.  Please refer to the transcribed history and physical as well as the operative note for details of admission to the hospital.  HOSPITAL COURSE:  Postoperatively the patient was admitted to the fourth floor, and she had a fairly unremarkable course. She remained afebrile. Her hemoglobin and hematocrit was somewhat of a concern. We did an H&H in the recovery room that came out to be 12 and 36, and on postoperative day number one it was 11.0 and 31.4 with a white count of 11,000. She was reporting no vaginal bleeding, and her abdomen was benign. No evidence of intraperitoneal bleeding whatsoever. She tolerated clear liquids and a regular diet. She had progression with normal bowel function. She ambulated without symptoms and tolerated her oral pain medicine. We did have to change her to Vicodin because of itching with Tylox. I evaluated her on the afternoon of postoperative day #1 and she was doing quite well. As a result, she was discharged to home that afternoon on Vicodin as needed for pain, #30, and Phenergan for any more episodes of nausea. She was given aftercare instructions and instructions and precautions for contacting our office. She will be evaluated in the office in one week per routine. Dictated by:   Duane Lope, M.D. Attending Physician:  Lazaro Arms DD:  05/21/01 TD:  05/21/01 Job: [id number]

## 2010-08-15 NOTE — Group Therapy Note (Signed)
NAMELucindia, [name]                   ACCOUNT NO.:  [id number]   MEDICAL RECORD NO.:  [id number]          PATIENT TYPE:  INP   LOCATION:  A341                          FACILITY:  [facility]   PHYSICIAN:  Edward L. Juanetta Gosling, M.D.DATE OF BIRTH:  02-Feb-1966   DATE OF PROCEDURE:  DATE OF DISCHARGE:                                   PROGRESS NOTE   Patient of Social research officer, government.   SUBJECTIVE:  [name] is overall about the same.  She was moved to the  isolation room because of the potential that this represents active  tuberculosis.  She says she is feeling better.  She is still coughing, and  still bringing up some sputum.   OBJECTIVE:  VITAL SIGNS:  Her temperature is 101.1; pulse is 86,  respirations 20, blood pressure 128/64, O2 sats is 96% on 2 liters.  CHEST:  Fairly clear.  HEART:  Regular.   ASSESSMENT AND PLAN:  She is better, and also, I think it is more likely  that this represents an acute pneumonia rather than tuberculosis, but I  agree we still need to go ahead with isolation, etc.  She has already  provided one sputum, and hopefully, we can eliminate the possibility of TB  fairly soon.      Edward L. Juanetta Gosling, M.D.  Electronically Signed     ELH/MEDQ  D:  07/29/2005  T:  07/29/2005  Job:  [id number]

## 2010-08-15 NOTE — Discharge Summary (Signed)
NAMESAHARRA, [name]                   ACCOUNT NO.:  [id number]   MEDICAL RECORD NO.:  [id number]          PATIENT TYPE:  INP   LOCATION:  A310                          FACILITY:  [facility]   PHYSICIAN:  Patrica Duel, M.D.    DATE OF BIRTH:  09/06/1965   DATE OF ADMISSION:  11/16/2005  DATE OF DISCHARGE:  08/23/2007LH                                 DISCHARGE SUMMARY   DISCHARGE DIAGNOSIS:  1. Abdominal pain, questionable etiology, resolved, possible mesenteric      adenitis considering Mycobacterium Avium intracellulare involvement      doubtful.  2. Headache with negative imaging studies, good response to Neurontin.  3. History of asthma with atypical pneumonia.  Culture positive for      Mycobacterium Avium intracellulare with thrice weekly ethambutol and      Zithromax therapy.  4. Mild depression, controlled.  5. History of migraine headache.  6. Status post cholecystectomy, partial hysterectomy with subsequent      complete hysterectomy and bladder suspension.  7. History of neck surgery in 2006.   For details regarding admission, please refer the Admitting Note.  Briefly,  this 45 year old female the above history presented to the emergency  department on several occasions with intense epigastric and left upper  quadrant abdominal pain.  She had mild nausea, but no hematemesis, diarrhea,  melena or hematochezia.  She also had no fever or chills.  Her laboratory  workup was benign.  CT scan revealed some sub centimeter mesenteric and  right lower quadrant mesenteric lymphadenopathy which was nonresponsive and  possibly reactive with postop changes.  Appendix is surgically absent.  Pelvic CT was negative.   The patient failed outpatient therapy was admitted for ongoing evaluation  and management of abdominal pain.  She also had some headache.   HOSPITAL COURSE:  The patient's workup continued to be benign.  She was  treated with empiric PPI's.  Dr. Jena Gauss saw the patient in  consultation and  thought that she probably has mesenteric adenitis but wanted to consider the  possibility of Mycobacterium involvement of the mesentery.   The patient was also seen by Dr. Gerilyn Pilgrim who added Neurontin to her  regimen.  Her headaches resolved.   The patient did well and was stable for discharge.  Of note, H.  Pylori  serology was positive at 4.9.  We need to check records regarding previous  treatment.   DISPOSITION:  She will continue medications which include citalopram 40  q.d., Midrin p.r.n., oxybutynin p.r.n. Tussionex p.r.n., ethambutol and  Zithromax thrice weekly.  Consider treatment with Prevpac, if this has not  been done in the past.  She be followed and treated expectantly as  outpatient.      Patrica Duel, M.D.  Electronically Signed     MC/MEDQ  D:  12/07/2005  T:  12/07/2005  Job:  [id number]

## 2010-08-15 NOTE — H&P (Signed)
[facility]  Patient:    [name], [name] Visit Number: [id number] MRN: [id number]          Service Type: [facility] Location: [facility] Attending Physician:  Fredirick Maudlin Dictated by:   Duane Lope, M.D. Admit Date:  12/21/2000 Discharge Date: 01/20/2001                           History and Physical  DATE OF BIRTH:  31-May-1965  REASON FOR ADMISSION:  Vaginal hysterectomy.  HISTORY OF PRESENT ILLNESS:  Allan is a 45 year old white female, gravida 5, para 5, abortus 0, with last menstrual period April 15, 2001, status post a laparoscopic bilateral tubal ligation who is admitted for vaginal hysterectomy.  The patient originally came to see me in January for evaluation of a Pap smear revealing dysplasia; however, it returned as koilocytic atypia. During the course of the initial visit and subsequent visits, it was learned the patient suffers quite a bit with dysmenorrhea and hypermenorrhea and dyspareunia which has been going on several years.  She has been on birth control pills in the past to no avail, and they do not really help her at all. Her pain is completely midline but is so bad the first couple of days she really cannot walk.  She has to use two pads at a time and goes through three large boxes of super maxipads each cycle.  She has quite a bit of clotting and cramping with her periods.  We discussed hysteroscopy D&C for evaluation versus definitive therapy, and she states she has been contemplating this for some time.  After evaluating the alternative, indications, and risks, she has decided to proceed.  PAST MEDICAL HISTORY: 1. Significant for gastroesophageal reflux disease for which she is on    Prevacid. 2. Asthma.  PAST SURGICAL HISTORY:  Gallbladder laparoscopically, laparoscopic tubal ligation, appendectomy, jaw surgery, adenoids, and tonsils.  PAST OBSTETRICAL HISTORY:  She has had five vaginal deliveries.  MEDICATIONS:  Prevacid and  Combivent for asthma.  REVIEW OF SYSTEMS:  Otherwise negative.  FAMILY HISTORY:  Significant for atherosclerotic cardiovascular disease and lung disease.  PHYSICAL EXAMINATION:  VITAL SIGNS:  The patient weighs 231 pounds.  Blood pressure 120/80.  HEENT:  Unremarkable.  NECK:  Normal thyroid.  LUNGS:  Clear without any wheezes.  HEART: Regular rate and rhythm without murmurs, regurgitation, or gallop.  BREASTS:  Without masses, discharge, skin changes.  No axillary adenopathy.  ABDOMEN:  Benign without hepatosplenomegaly or mass.  GU:  Normal external genitalia.  Vagina is without discharge.  Cervix is parous without lesions.  Uterus is mid plane, somewhat tender to palpation. It is globular but really not significantly enlarged.  It is consistent with adenomyosis.  The ovaries are normal size and nontender.  EXTREMITIES:  Warm with no edema.  NEUROLOGIC:  Grossly intact.  IMPRESSION: 1. Dysmenorrhea. 2. Hypermenorrhea. 3. Dyspareunia. 4. Probable adenomyosis.  PLAN:  The patient is admitted for a vaginal hysterectomy.  She understands the risks, benefits, indications, alternatives, and will proceed. Dictated by:   Duane Lope, M.D. Attending Physician:  Fredirick Maudlin DD:  05/18/01 TD:  05/18/01 Job: [id number]

## 2010-08-15 NOTE — Op Note (Signed)
NAME:  [name], [name]                   ACCOUNT NO.:  [id number]   MEDICAL RECORD NO.:  [id number]          PATIENT TYPE:  INP   LOCATION:  A310                          FACILITY:  [facility]   PHYSICIAN:  R. Roetta Sessions, M.D. DATE OF BIRTH:  [birth year]   DATE OF PROCEDURE:  11/18/2005  DATE OF DISCHARGE:                                 OPERATIVE REPORT   PROCEDURE:  Esophagogastroduodenoscopy with biopsy.   INDICATIONS FOR PROCEDURE:  The patient is a 45 year old lady admitted to  the hospital with postprandial upper abdominal pain, gallbladder has been  out for a good 5 years.  She had a mild bump in her SGPT.  EGD is now being  done to further evaluate her symptoms.  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  CT  demonstrated some reactive adenopathy in right lower quadrant.  She really  is not having any right lower quadrant symptoms per se.  EGD is now being  done.  This approach has been discussed with the patient at length.  Potential risks, benefits and alternatives have been reviewed, questions  answered, she is agreeable.  Please see documentation in medical record.   PROCEDURE NOTE:  The O2 saturation, blood pressure, pulse, respirations were  monitored throughout entire procedure.   CONSCIOUS SEDATION:  1. IV Versed.  2. Demerol in incremental doses.  3. Cetacaine spray for topical oropharyngeal anesthesia.   FINDINGS:  Examination of the tubular esophagus revealed a non-critical  Shatzki's ring.  Esophageal mucosa otherwise appeared normal.  The EG  junction easily traversed.  The remaining stomach, colon, gastric cavity had  quite a bit of bile stained mucus within it, it insufflated well with air,  there was no food.  A thorough examination of gastric mucosa including  retroflexion from the proximal stomach, esophagogastric junction  demonstrated some snake skinning or fish scale appearance of the gastric  mucosa and some linear erosions in the area of the body.  It is notable that  the patient retched repeatedly during the procedure.  Some of these changes  may have been related to trauma.  There was no obvious infiltrating process,  peptic ulcer, etc.  Pylorus was patent and easily traversed.  Examination of  the bulb and second portion revealed no abnormalities.   THERAPEUTIC DIAGNOSTIC MANEUVERS PERFORMED:  Biopsies at the gastric body  were taken for histologic study.  The patient tolerated the procedure well  and was reactive to endoscopy.   IMPRESSION:  1. Non-critical Shatzki's ring not manipulated, otherwise normal      esophagus.  2. Bile stained gastric mucosa with fish scale or snake skin appearance of      gastric mucosa and some linear erosions which in part may be related to      trauma.  I doubt this lady has advanced chronic liver disease.   DISCUSSION:  There is no endoscopic explanation of patient's symptoms based  on today's findings.   She had a bump in her SGPT.  One would have to consider  the possibility of  sphincter of Oti dysfunction as contributing to her symptoms.  In addition,  if she has MAI infection this is somewhat bothersome.  I would query her HIV  status.  MAI is notorious for producing an intraabdominal infection which is  difficult to treat.  She has nonspecific adenopathy which may or may not  have anything to do with her presenting GI symptoms or her co-existing MAI  infection elsewhere.   Would allow a clear liquid diet, will see how she does over the next 24  hours.  Will repeat her LFTs tomorrow morning.   If she does not improve, may need to consider further evaluation.  Mycobacterium infections in the GI tract usually reside in the distal small  bowel and right colon.  May need to give some consideration to an __________  at some point.  Will hold off on that approach for the time being.      [name], M.D.  Electronically  Signed     RMR/MEDQ  D:  11/18/2005  T:  11/19/2005  Job:  [id number]   cc:   [name], M.D.  Fax: [phone]

## 2010-08-15 NOTE — Consult Note (Signed)
NAMEJourdyn, [name] [name]                   ACCOUNT NO.:  [id number]   MEDICAL RECORD NO.:  [id number]          PATIENT TYPE:  INP   LOCATION:  A341                          FACILITY:  [facility]   PHYSICIAN:  Edward L. Juanetta Gosling, M.D.DATE OF BIRTH:  [birth year]   DATE OF CONSULTATION:  DATE OF DISCHARGE:                                   CONSULTATION   REASON FOR CONSULTATION:  Abnormal CT of the chest.   HISTORY:  [name] is a 45 year old, who has a long history of asthma.  She  has had a history of depression, hysterectomy, migraine headaches, and came  to the emergency room after about a week history of cough, congestion,  wheezing.  She had been treated with Levaquin and albuterol and was able to  be discharged home on her initial visit.  She then came back to the  emergency room on the day of admission, was found to be febrile, and a chest  x-ray showed, what looked like, a questionable right upper lobe infiltrate.  She underwent a CT chest because D-dimer was somewhat elevated and this  showed that she has an atypical-looking infiltrate in the right upper lobe  in the posterior segment and this could be consistent with tuberculosis.  It  is a rather odd looking infiltrate.  It may be simply an early infiltrate,  but obviously with it's location, we need to be at least concerned that it  may be TB.   PAST MEDICAL HISTORY:  As above.   MEDICATIONS:  She currently takes at home:  Citalopram 40 mg daily, Midrin  p.r.n., oxybutynin p.r.n., Tussionex p.r.n., and Levaquin that was started  the day prior to admission.   ALLERGIES:  She is allergic to PEANUTS and PREDNISONE.   FAMILY HISTORY:  No known history of TB.  She has had no exposure to TB.  She does not know of anyone in the area who has had any problems with  tuberculosis, etc.   SOCIAL HISTORY:  She is a nonsmoker.  She does not drink any alcohol.  She  is taking care of three grandchildren, which of course, if she does have  active  TB, is a concern.   REVIEW OF SYSTEMS:  Otherwise is pretty much negative.   PHYSICAL EXAMINATION:  GENERAL:  A well-developed, well-nourished female,  who does not appear to be in any acute distress.  She is awake and alert.  VITAL SIGNS:  Her temperature is 98.4, pulse 104, respirations 20, blood  pressure 125/62, O2 sats 96%.  HEENT:  Her pupils are equal, round, and  reactive to light and accommodation.  Her mucous membranes are moist.  NECK:  Supple, without masses.  CHEST:  Some rhonchi bilaterally.  No wheezes.  HEART:  Regular, without murmur, gallop, or rub.  ABDOMEN:  Soft.  No masses  are felt.  EXTREMITIES:  No edema.  Her O2 sat is 96% on 2 liters, height 65  inches, weight 219.8.   LABORATORY DATA:  A chest x-ray and CT are as described.   ASSESSMENT:  Clinically, this  does not appear to be TB, but with the  findings on the x-ray, she does need to go ahead and be isolated.  I have  gone ahead and written for that.  You have already written for sputums.  She  has got a TB skin test already placed.  Then, we will decide what we need to  do after the results of the above are back.      Edward L. Juanetta Gosling, M.D.  Electronically Signed     ELH/MEDQ  D:  07/28/2005  T:  07/29/2005  Job:  [id number]

## 2010-08-20 ENCOUNTER — Ambulatory Visit (HOSPITAL_COMMUNITY)
Admission: RE | Admit: 2010-08-20 | Discharge: 2010-08-20 | Disposition: A | Discharge status: Home / Self Care | Payer: BC Managed Care – PPO | Source: Ambulatory Visit | Attending: Internal Medicine | Admitting: Internal Medicine

## 2010-08-22 ENCOUNTER — Ambulatory Visit (HOSPITAL_COMMUNITY)
Admission: RE | Admit: 2010-08-22 | Discharge: 2010-08-22 | Disposition: A | Discharge status: Home / Self Care | Payer: BC Managed Care – PPO | Source: Ambulatory Visit | Attending: Internal Medicine | Admitting: Internal Medicine

## 2010-08-22 DIAGNOSIS — IMO0001 Reserved for inherently not codable concepts without codable children: Secondary | ICD-10-CM | POA: Insufficient documentation

## 2010-08-22 DIAGNOSIS — M5412 Radiculopathy, cervical region: Secondary | ICD-10-CM | POA: Insufficient documentation

## 2010-08-22 DIAGNOSIS — M6281 Muscle weakness (generalized): Secondary | ICD-10-CM | POA: Insufficient documentation

## 2010-08-22 DIAGNOSIS — M542 Cervicalgia: Secondary | ICD-10-CM | POA: Insufficient documentation

## 2010-08-27 ENCOUNTER — Ambulatory Visit (HOSPITAL_COMMUNITY): Payer: BC Managed Care – PPO | Admitting: Physical Therapy

## 2010-08-29 ENCOUNTER — Ambulatory Visit (HOSPITAL_COMMUNITY): Payer: BC Managed Care – PPO

## 2010-09-02 ENCOUNTER — Ambulatory Visit (HOSPITAL_COMMUNITY): Payer: BC Managed Care – PPO | Admitting: Physical Therapy

## 2010-09-04 ENCOUNTER — Ambulatory Visit (HOSPITAL_COMMUNITY): Payer: BC Managed Care – PPO | Admitting: Physical Therapy

## 2010-12-23 DIAGNOSIS — M47812 Spondylosis without myelopathy or radiculopathy, cervical region: Secondary | ICD-10-CM | POA: Insufficient documentation

## 2011-01-15 ENCOUNTER — Emergency Department (HOSPITAL_COMMUNITY)
Admission: EM | Admit: 2011-01-15 | Discharge: 2011-01-15 | Disposition: A | Discharge status: Other Acute Inpatient Hospital | Payer: BC Managed Care – PPO | Attending: Emergency Medicine | Admitting: Emergency Medicine

## 2011-01-15 ENCOUNTER — Encounter: Payer: Self-pay | Admitting: Emergency Medicine

## 2011-01-15 ENCOUNTER — Emergency Department (HOSPITAL_COMMUNITY): Payer: BC Managed Care – PPO

## 2011-01-15 DIAGNOSIS — Z9889 Other specified postprocedural states: Secondary | ICD-10-CM | POA: Insufficient documentation

## 2011-01-15 DIAGNOSIS — G35 Multiple sclerosis: Secondary | ICD-10-CM | POA: Insufficient documentation

## 2011-01-15 DIAGNOSIS — J45909 Unspecified asthma, uncomplicated: Secondary | ICD-10-CM | POA: Insufficient documentation

## 2011-01-15 DIAGNOSIS — M542 Cervicalgia: Secondary | ICD-10-CM | POA: Insufficient documentation

## 2011-01-15 HISTORY — DX: Multiple sclerosis: G35

## 2011-01-15 LAB — CBC
HCT: 37.1 % (ref 36.0–46.0)
Hemoglobin: 12.1 g/dL (ref 12.0–15.0)
MCH: 29 pg (ref 26.0–34.0)
MCHC: 32.6 g/dL (ref 30.0–36.0)
MCV: 89 fL (ref 78.0–100.0)
Platelets: 246 10*3/uL (ref 150–400)
RBC: 4.17 MIL/uL (ref 3.87–5.11)
RDW: 12.3 % (ref 11.5–15.5)
WBC: 5.4 10*3/uL (ref 4.0–10.5)

## 2011-01-15 LAB — BASIC METABOLIC PANEL
BUN: 11 mg/dL (ref 6–23)
CO2: 31 mEq/L (ref 19–32)
Calcium: 8.9 mg/dL (ref 8.4–10.5)
Chloride: 99 mEq/L (ref 96–112)
Creatinine, Ser: 0.75 mg/dL (ref 0.50–1.10)
GFR calc Af Amer: >90 mL/min (ref >90)
GFR calc non Af Amer: >90 mL/min (ref >90)
Glucose, Bld: 97 mg/dL (ref 70–99)
Potassium: 4 mEq/L (ref 3.5–5.1)
Sodium: 135 mEq/L (ref 135–145)

## 2011-01-15 LAB — C-REACTIVE PROTEIN: CRP: 1.44 mg/dL — ABNORMAL HIGH (ref <0.60)

## 2011-01-15 LAB — SEDIMENTATION RATE: Sed Rate: 28 mm/hr — ABNORMAL HIGH (ref 0–22)

## 2011-01-15 MED ORDER — MORPHINE SULFATE 4 MG/ML IJ SOLN
4.0000 mg | Freq: Once | INTRAMUSCULAR | Status: AC
  Administered 2011-01-15: 4 mg via INTRAVENOUS
  Filled 2011-01-15: qty 1

## 2011-01-15 MED ORDER — ONDANSETRON HCL 4 MG/2ML IJ SOLN
4.0000 mg | Freq: Once | INTRAMUSCULAR | Status: AC
  Administered 2011-01-15: 4 mg via INTRAVENOUS
  Filled 2011-01-15: qty 2

## 2011-01-15 MED ORDER — SODIUM CHLORIDE 0.9 % IV SOLN
Freq: Once | INTRAVENOUS | Status: AC
  Administered 2011-01-15: 13:00:00 via INTRAVENOUS

## 2011-01-15 MED ORDER — SODIUM CHLORIDE 0.9 % IV SOLN
Freq: Once | INTRAVENOUS | Status: AC
  Administered 2011-01-15: 15:00:00 via INTRAVENOUS

## 2011-01-15 MED ORDER — IBUPROFEN 400 MG PO TABS
400.0000 mg | ORAL_TABLET | Freq: Once | ORAL | Status: AC
  Administered 2011-01-15: 400 mg via ORAL
  Filled 2011-01-15: qty 1

## 2011-01-15 MED ORDER — CLINDAMYCIN PHOSPHATE 900 MG/50ML IV SOLN
900.0000 mg | Freq: Once | INTRAVENOUS | Status: AC
  Administered 2011-01-15: 900 mg via INTRAVENOUS
  Filled 2011-01-15: qty 50

## 2011-01-15 MED ORDER — ONDANSETRON HCL 4 MG/2ML IJ SOLN
INTRAMUSCULAR | Status: AC
  Filled 2011-01-15: qty 2

## 2011-01-15 MED ORDER — IOHEXOL 300 MG/ML  SOLN
75.0000 mL | Freq: Once | INTRAMUSCULAR | Status: AC | PRN
  Administered 2011-01-15: 75 mL via INTRAVENOUS

## 2011-01-15 MED ORDER — ONDANSETRON HCL 4 MG/2ML IJ SOLN
4.0000 mg | Freq: Once | INTRAMUSCULAR | Status: AC
  Administered 2011-01-15: 4 mg via INTRAVENOUS

## 2011-01-15 NOTE — ED Notes (Signed)
Report given to Boneta Lucks, RN with [facility]. ETA 10 minutes for [facility].

## 2011-01-15 NOTE — ED Notes (Addendum)
Patient lying in bed. NAD noted. Pt states pain is getting better with morphine. Rates pain 6/10 on NPS.

## 2011-01-15 NOTE — ED Provider Notes (Signed)
History   This chart was scribed for Debbie Razor, MD by [name]. The patient was seen in room APA06/APA06 and the patient's care was started at 11:39AM.   CSN: [id number] Arrival date & time: 01/15/2011 11:14 AM   First MD Initiated Contact with Patient 01/15/11 1123      Chief Complaint  Patient presents with  . Neck Pain   HPI [name] is a 45 y.o. female who presents to the Emergency Department complaining of constant severe right sided neck pain which radiates to RUE intermittently onset several days ago and worsening since. Also notes having a "knot" to right clavicular region and mild pain with swallowing. Patient reports neck pain is aggravated with movement of head and relieved by nothing. Denies fever, HA, numbness, tingling. Patient had surgery performed on neck at level of C4-C6 on November 21, 2010 due to multiple bone spurs and states pain following surgery gradually improved and was resolved until onset of current pain began several days ago. Denies recent dental procedures.   Past Medical History  Diagnosis Date  . Asthma   . MS (multiple sclerosis)     Past Surgical History  Procedure Date  . Neck surgery   . Appendectomy   . Cholecystectomy   . Abdominal hysterectomy   . Mandible surgery     History reviewed. No pertinent family history.  History  Substance Use Topics  . Smoking status: Never Smoker   . Smokeless tobacco: Not on file  . Alcohol Use: Yes    OB History    Grav Para Term Preterm Abortions TAB SAB Ect Mult Living                  Review of Systems 10 Systems reviewed and are negative for acute change except as noted in the HPI.  Allergies  Latex; Peanut-containing drug products; Prednisone; and Omeprazole  Home Medications   Current Outpatient Rx  Name Route Sig Dispense Refill  . ALBUTEROL SULFATE HFA 108 (90 BASE) MCG/ACT IN AERS Inhalation Inhale 2 puffs into the lungs every 6 (six) hours as needed. Asthma     .  CARBAMAZEPINE 100 MG PO CHEW Oral Chew 100 mg by mouth 2 (two) times daily.      Marland Kitchen CITALOPRAM HYDROBROMIDE 40 MG PO TABS Oral Take 40 mg by mouth at bedtime.      Marland Kitchen GABAPENTIN 300 MG PO CAPS Oral Take 300 mg by mouth 3 (three) times daily.      . IBUPROFEN 200 MG PO TABS Oral Take 600 mg by mouth every other day. Patient takes before she gives herself her injection.     . INTERFERON BETA-1A 44 MCG/0.5ML Viking SOLN Subcutaneous Inject 44 mcg into the skin every other day.      . METHOCARBAMOL 500 MG PO TABS Oral Take 500 mg by mouth 3 (three) times daily.      . OXYBUTYNIN CHLORIDE 5 MG PO TABS Oral Take 5 mg by mouth 2 (two) times daily.        BP 142/75  Pulse 91  Temp(Src) 97.9 F (36.6 C) (Oral)  Resp 16  Ht 5\' 5"  (1.651 m)  Wt 226 lb (102.513 kg)  BMI 37.61 kg/m2  SpO2 99%  Physical Exam  Nursing note and vitals reviewed. Constitutional: She is oriented to person, place, and time. She appears well-developed and well-nourished. No distress.  HENT:  Head: Normocephalic and atraumatic.  Mouth/Throat: Oropharynx is clear and moist.  Eyes: EOM  are normal. Pupils are equal, round, and reactive to light.  Neck: Normal range of motion. Neck supple. No tracheal deviation present.  Cardiovascular: Normal rate, regular rhythm and normal heart sounds.  Exam reveals no friction rub.   No murmur heard. Pulmonary/Chest: Effort normal. No respiratory distress. She has no wheezes.  Abdominal: Soft. She exhibits no distension.  Musculoskeletal: Normal range of motion. She exhibits no edema.       Fullness to right supraclavicular region with no mass or induration noted. Tenderness to palpation over right lateral neck.   Lymphadenopathy:    She has no cervical adenopathy.       Right axillary: No lateral adenopathy present.       Left axillary: No lateral adenopathy present. Neurological: She is alert and oriented to person, place, and time. No sensory deficit. Coordination normal.        Neurovascularly intact distally.   Skin: Skin is warm and dry.       No overlying skin lesions noted.   Psychiatric: She has a normal mood and affect. Her behavior is normal.    ED Course  Procedures (including critical care time)  DIAGNOSTIC STUDIES: Oxygen Saturation is 99% on room air, normal by my interpretation.    COORDINATION OF CARE: 12:55PM- Patient informed of imaging results and intent to administer IV abx to treat for possible infection. Patient consents with plan set forth at this time.  1:31 PM-Pt's surgeon consulted and recommends patient be transferred to [facility]. 1:49PM- Patient informed of recommendation by surgeon and intent to transfer to [facility].    Labs Reviewed  BASIC METABOLIC PANEL   Ct Soft Tissue Neck W Contrast  01/15/2011  *RADIOLOGY REPORT*  Clinical Data: Neck surgery August 2012 increasing right neck pain.  CT NECK WITH CONTRAST  Technique:  Multidetector CT imaging of the neck was performed with intravenous contrast.  Contrast: 75mL OMNIPAQUE IOHEXOL 300 MG/ML IV SOLN  Comparison: Plain film examination of the cervical spine 05/29/2010.  Findings: Prior fusion C4-C7 with anterior plate and screws in place.  No complicating features of the fusion site identified.  No definitive large recurrent disc herniation.  Mild spinal stenosis C3-4 level.  Right-sided subcutaneous abnormality extends from the lower trachea to the lower neck most prominent anterior margin of the lower aspect of the right sternocleidomastoid muscle where there is poor definition of fat planes including fat planes overlying the right thyroid gland.  This may represent cellulitis/inflammation of the surgical incision site.  (Series 2 image 67).  Increased number of lymph nodes throughout the neck largest level II region.  Index lymph node right level II region measures 1.5 x 0.9 cm maximal transverse dimension.  These may be reactive lymph nodes secondary  to neck inflammation.  No primary neck mass identified.  Visualized lung apices clear.  IMPRESSION: Prior fusion C4-C7 with anterior plate and screws in place.  No complicating features of the fusion site identified.  Right-sided subcutaneous abnormality extends from the lower trachea to the lower neck most prominent anterior margin of the lower aspect of the right sternocleidomastoid muscle where there is poor definition of fat planes including fat planes overlying the right thyroid gland.  This may represent cellulitis/inflammation of the surgical incision site.  (Series 2 image 67).  Increased number of lymph nodes throughout the neck largest level II region may be reactive lymph nodes secondary to neck inflammation.  Original Report Authenticated By: Fuller Canada, M.D.  1. Neck pain       MDM  45yF with neck pain and swelling. S/p c-spine surgery 2 months ago. CT with inflammatory changes R neck.  Discussed with Swee[facility], neurosurgery. May just be early developing cellulitis, but requesting transfer to [facility] for further evaluation. Requesting additional labs prior to transfer.     I personally preformed the services scribed in my presence. The recorded information has been reviewed and considered. Debbie Razor, MD.   Debbie Razor, MD 01/15/11 [phone]

## 2011-01-15 NOTE — ED Notes (Signed)
Pt left ED via stretcher with [employer] Patient in NAD at time of departure from ED. Report given to transport team.

## 2011-01-15 NOTE — ED Notes (Signed)
Pt has neck surgery aug 24 and a knot has came up on the rt side of her neck and pt states the pain is unbearable.

## 2011-01-15 NOTE — ED Notes (Signed)
Report given to [facility] ED Charge nurse. Awaiting transport at this time.

## 2011-03-02 ENCOUNTER — Other Ambulatory Visit (HOSPITAL_COMMUNITY): Payer: Self-pay | Admitting: Internal Medicine

## 2011-03-02 DIAGNOSIS — R1011 Right upper quadrant pain: Secondary | ICD-10-CM

## 2011-03-05 ENCOUNTER — Other Ambulatory Visit (HOSPITAL_COMMUNITY): Payer: Self-pay | Admitting: Internal Medicine

## 2011-03-05 ENCOUNTER — Ambulatory Visit (HOSPITAL_COMMUNITY)
Admission: RE | Admit: 2011-03-05 | Discharge: 2011-03-05 | Disposition: A | Discharge status: Home / Self Care | Payer: BC Managed Care – PPO | Source: Ambulatory Visit | Attending: Internal Medicine | Admitting: Internal Medicine

## 2011-03-05 DIAGNOSIS — R1011 Right upper quadrant pain: Secondary | ICD-10-CM

## 2011-03-05 DIAGNOSIS — Z139 Encounter for screening, unspecified: Secondary | ICD-10-CM

## 2011-04-06 ENCOUNTER — Ambulatory Visit (HOSPITAL_COMMUNITY): Payer: BC Managed Care – PPO

## 2011-05-07 DIAGNOSIS — F32A Depression, unspecified: Secondary | ICD-10-CM | POA: Insufficient documentation

## 2011-05-07 DIAGNOSIS — G4730 Sleep apnea, unspecified: Secondary | ICD-10-CM | POA: Insufficient documentation

## 2011-05-07 DIAGNOSIS — G4700 Insomnia, unspecified: Secondary | ICD-10-CM | POA: Insufficient documentation

## 2011-07-09 ENCOUNTER — Ambulatory Visit (HOSPITAL_COMMUNITY): Admission: RE | Admit: 2011-07-09 | Payer: BC Managed Care – PPO | Source: Ambulatory Visit

## 2011-08-13 ENCOUNTER — Other Ambulatory Visit (HOSPITAL_COMMUNITY): Payer: Self-pay | Admitting: Internal Medicine

## 2011-08-13 DIAGNOSIS — M549 Dorsalgia, unspecified: Secondary | ICD-10-CM

## 2011-08-14 ENCOUNTER — Ambulatory Visit (HOSPITAL_COMMUNITY)
Admission: RE | Admit: 2011-08-14 | Discharge: 2011-08-14 | Disposition: A | Discharge status: Home / Self Care | Payer: BC Managed Care – PPO | Source: Ambulatory Visit | Attending: Internal Medicine | Admitting: Internal Medicine

## 2011-08-14 DIAGNOSIS — Z1231 Encounter for screening mammogram for malignant neoplasm of breast: Secondary | ICD-10-CM | POA: Insufficient documentation

## 2011-08-14 DIAGNOSIS — Z139 Encounter for screening, unspecified: Secondary | ICD-10-CM

## 2011-08-15 ENCOUNTER — Emergency Department (HOSPITAL_COMMUNITY): Payer: BC Managed Care – PPO

## 2011-08-15 ENCOUNTER — Encounter (HOSPITAL_COMMUNITY): Payer: Self-pay | Admitting: Emergency Medicine

## 2011-08-15 ENCOUNTER — Emergency Department (HOSPITAL_COMMUNITY)
Admission: EM | Admit: 2011-08-15 | Discharge: 2011-08-15 | Disposition: A | Discharge status: Home / Self Care | Payer: BC Managed Care – PPO | Attending: Emergency Medicine | Admitting: Emergency Medicine

## 2011-08-15 DIAGNOSIS — R109 Unspecified abdominal pain: Secondary | ICD-10-CM | POA: Insufficient documentation

## 2011-08-15 DIAGNOSIS — R110 Nausea: Secondary | ICD-10-CM | POA: Insufficient documentation

## 2011-08-15 DIAGNOSIS — Z79899 Other long term (current) drug therapy: Secondary | ICD-10-CM | POA: Insufficient documentation

## 2011-08-15 DIAGNOSIS — G35 Multiple sclerosis: Secondary | ICD-10-CM | POA: Insufficient documentation

## 2011-08-15 DIAGNOSIS — J45909 Unspecified asthma, uncomplicated: Secondary | ICD-10-CM | POA: Insufficient documentation

## 2011-08-15 DIAGNOSIS — K644 Residual hemorrhoidal skin tags: Secondary | ICD-10-CM | POA: Insufficient documentation

## 2011-08-15 DIAGNOSIS — K625 Hemorrhage of anus and rectum: Secondary | ICD-10-CM | POA: Insufficient documentation

## 2011-08-15 LAB — DIFFERENTIAL
Basophils Absolute: 0 10*3/uL (ref 0.0–0.1)
Eosinophils Relative: 2 % (ref 0–5)
Lymphocytes Relative: 32 % (ref 12–46)
Lymphs Abs: 1.5 10*3/uL (ref 0.7–4.0)
Monocytes Absolute: 0.4 10*3/uL (ref 0.1–1.0)
Monocytes Relative: 9 % (ref 3–12)
Neutro Abs: 2.6 10*3/uL (ref 1.7–7.7)

## 2011-08-15 LAB — COMPREHENSIVE METABOLIC PANEL
AST: 34 U/L (ref 0–37)
BUN: 11 mg/dL (ref 6–23)
CO2: 26 mEq/L (ref 19–32)
Chloride: 101 mEq/L (ref 96–112)
Creatinine, Ser: 0.65 mg/dL (ref 0.50–1.10)
GFR calc Af Amer: >90 mL/min (ref >90)
GFR calc non Af Amer: >90 mL/min (ref >90)
Glucose, Bld: 110 mg/dL — ABNORMAL HIGH (ref 70–99)
Total Bilirubin: 0.2 mg/dL — ABNORMAL LOW (ref 0.3–1.2)

## 2011-08-15 LAB — URINALYSIS, ROUTINE W REFLEX MICROSCOPIC
Glucose, UA: NEGATIVE mg/dL
Hgb urine dipstick: NEGATIVE
Leukocytes, UA: NEGATIVE
pH: 5.5 (ref 5.0–8.0)

## 2011-08-15 LAB — CBC
HCT: 39.5 % (ref 36.0–46.0)
Hemoglobin: 13.4 g/dL (ref 12.0–15.0)
MCV: 87.2 fL (ref 78.0–100.0)
WBC: 4.7 10*3/uL (ref 4.0–10.5)

## 2011-08-15 MED ORDER — IOHEXOL 300 MG/ML  SOLN
100.0000 mL | Freq: Once | INTRAMUSCULAR | Status: AC | PRN
  Administered 2011-08-15: 100 mL via INTRAVENOUS

## 2011-08-15 MED ORDER — GI COCKTAIL ~~LOC~~
30.0000 mL | Freq: Once | ORAL | Status: AC
  Administered 2011-08-15: 30 mL via ORAL
  Filled 2011-08-15: qty 30

## 2011-08-15 MED ORDER — FAMOTIDINE IN NACL 20-0.9 MG/50ML-% IV SOLN
20.0000 mg | Freq: Once | INTRAVENOUS | Status: AC
  Administered 2011-08-15: 20 mg via INTRAVENOUS
  Filled 2011-08-15: qty 50

## 2011-08-15 MED ORDER — ONDANSETRON HCL 4 MG/2ML IJ SOLN
4.0000 mg | INTRAMUSCULAR | Status: DC | PRN
  Administered 2011-08-15: 4 mg via INTRAVENOUS
  Filled 2011-08-15: qty 2

## 2011-08-15 MED ORDER — ONDANSETRON HCL 4 MG PO TABS: 4.0000 mg | ORAL_TABLET | Freq: Three times a day (TID) | ORAL | Status: AC | PRN

## 2011-08-15 MED ORDER — IOHEXOL 300 MG/ML  SOLN
40.0000 mL | Freq: Once | INTRAMUSCULAR | Status: AC | PRN
  Administered 2011-08-15: 40 mL via ORAL

## 2011-08-15 MED ORDER — SODIUM CHLORIDE 0.9 % IV SOLN
INTRAVENOUS | Status: DC
  Administered 2011-08-15: 14:00:00 via INTRAVENOUS

## 2011-08-15 MED ORDER — MORPHINE SULFATE 4 MG/ML IJ SOLN
4.0000 mg | INTRAMUSCULAR | Status: DC | PRN
  Administered 2011-08-15: 4 mg via INTRAVENOUS
  Filled 2011-08-15: qty 1

## 2011-08-15 MED ORDER — HYDROCODONE-ACETAMINOPHEN 5-325 MG PO TABS
ORAL_TABLET | ORAL | Status: AC
Start: 2011-08-15 — End: 2011-08-25

## 2011-08-15 NOTE — ED Notes (Signed)
Pt c/o epigastric pain with rectal bleeding and nausea since yesterday.

## 2011-08-15 NOTE — ED Provider Notes (Signed)
History     CSN: [id number]  Arrival date & time 08/15/11  1244   First MD Initiated Contact with Patient 08/15/11 1307      Chief Complaint  Patient presents with  . Abdominal Pain  . Nausea  . Rectal Bleeding    HPI Pt was seen at 1310.  Per pt, c/o gradual onset and persistence of constant upper abd "pain" since yesterday.  Has been assoc with nausea and rectal "bleeding" with stooling.  Denies vomiting/diarrhea, no back pain, no CP/SOB, no fevers.     Past Medical History  Diagnosis Date  . Asthma   . MS (multiple sclerosis)     Past Surgical History  Procedure Date  . Neck surgery   . Appendectomy   . Cholecystectomy   . Abdominal hysterectomy   . Mandible surgery     History  Substance Use Topics  . Smoking status: Never Smoker   . Smokeless tobacco: Not on file  . Alcohol Use: Yes     occasionally    Review of Systems ROS: Statement: All systems negative except as marked or noted in the HPI; Constitutional: Negative for fever and chills. ; ; Eyes: Negative for eye pain, redness and discharge. ; ; ENMT: Negative for ear pain, hoarseness, nasal congestion, sinus pressure and sore throat. ; ; Cardiovascular: Negative for chest pain, palpitations, diaphoresis, dyspnea and peripheral edema. ; ; Respiratory: Negative for cough, wheezing and stridor. ; ; Gastrointestinal: Negative for vomiting, diarrhea, hematemesis, jaundice and +nausea, abd pain, rectal bleeding. . ; ; Genitourinary: Negative for dysuria, flank pain and hematuria. ; ; Musculoskeletal: Negative for back pain and neck pain. Negative for swelling and trauma.; ; Skin: Negative for pruritus, rash, abrasions, blisters, bruising and skin lesion.; ; Neuro: Negative for headache, lightheadedness and neck stiffness. Negative for weakness, altered level of consciousness , altered mental status, extremity weakness, paresthesias, involuntary movement, seizure and syncope.      Allergies  Latex; Peanut-containing  drug products; Prednisone; and Omeprazole  Home Medications   Current Outpatient Rx  Name Route Sig Dispense Refill  . ALBUTEROL SULFATE HFA 108 (90 BASE) MCG/ACT IN AERS Inhalation Inhale 2 puffs into the lungs every 6 (six) hours as needed. Asthma     . CARBAMAZEPINE 100 MG PO CHEW Oral Chew 100 mg by mouth 2 (two) times daily.      Marland Kitchen CITALOPRAM HYDROBROMIDE 40 MG PO TABS Oral Take 40 mg by mouth at bedtime.      Marland Kitchen GABAPENTIN 300 MG PO CAPS Oral Take 300 mg by mouth 3 (three) times daily.      . IBUPROFEN 200 MG PO TABS Oral Take 600 mg by mouth every other day. Patient takes before she gives herself her injection.     . INTERFERON BETA-1A 44 MCG/0.5ML [place] SOLN Subcutaneous Inject 44 mcg into the skin every other day.      . METHOCARBAMOL 500 MG PO TABS Oral Take 500 mg by mouth 3 (three) times daily.      . OXYBUTYNIN CHLORIDE 5 MG PO TABS Oral Take 5 mg by mouth 2 (two) times daily.        BP 105/76  Pulse 91  Temp(Src) 98.1 F (36.7 C) (Oral)  Resp 20  Ht 5' 3.5" (1.613 m)  Wt 224 lb (101.606 kg)  BMI 39.06 kg/m2  SpO2 96%  Physical Exam 1315: Physical examination:  Nursing notes reviewed; Vital signs and O2 SAT reviewed;  Constitutional: Well developed, Well nourished, Well  hydrated, In no acute distress; Head:  Normocephalic, atraumatic; Eyes: EOMI, PERRL, No scleral icterus; ENMT: Mouth and pharynx normal, Mucous membranes moist; Neck: Supple, Full range of motion, No lymphadenopathy; Cardiovascular: Regular rate and rhythm, No murmur, rub, or gallop; Respiratory: Breath sounds clear & equal bilaterally, No rales, rhonchi, wheezes, or rub, Normal respiratory effort/excursion; Chest: Nontender, Movement normal; Abdomen: Soft, +mid-epigastric and LUQ areas tender to palp, no rebound or guarding, Nondistended, Normal bowel sounds; Rectal exam performed w/permission of pt and ED RN chaparone present.  Anal tone normal.  Non-tender, soft brown stool in rectal vault, heme neg.  No  fissures, +external hemorrhoids without thrombosis or bleeding, no palp masses.; Genitourinary: No CVA tenderness; Extremities: Pulses normal, No tenderness, No edema, No calf edema or asymmetry.; Neuro: AA&Ox3, Major CN grossly intact.  No gross focal motor or sensory deficits in extremities.; Skin: Color normal, Warm, Dry   ED Course  Procedures   MDM  MDM Reviewed: nursing note and vitals Interpretation: labs and CT scan   Results for orders placed during the hospital encounter of 08/15/11  URINALYSIS, ROUTINE W REFLEX MICROSCOPIC      Component Value Range   Color, Urine YELLOW  YELLOW    APPearance CLEAR  CLEAR    Specific Gravity, Urine >1.030 (*) 1.005 - 1.030    pH 5.5  5.0 - 8.0    Glucose, UA NEGATIVE  NEGATIVE (mg/dL)   Hgb urine dipstick NEGATIVE  NEGATIVE    Bilirubin Urine NEGATIVE  NEGATIVE    Ketones, ur NEGATIVE  NEGATIVE (mg/dL)   Protein, ur NEGATIVE  NEGATIVE (mg/dL)   Urobilinogen, UA 0.2  0.0 - 1.0 (mg/dL)   Nitrite NEGATIVE  NEGATIVE    Leukocytes, UA NEGATIVE  NEGATIVE   CBC      Component Value Range   WBC 4.7  4.0 - 10.5 (K/uL)   RBC 4.53  3.87 - 5.11 (MIL/uL)   Hemoglobin 13.4  12.0 - 15.0 (g/dL)   HCT 45.4  09.8 - 11.9 (%)   MCV 87.2  78.0 - 100.0 (fL)   MCH 29.6  26.0 - 34.0 (pg)   MCHC 33.9  30.0 - 36.0 (g/dL)   RDW 14.7  82.9 - 56.2 (%)   Platelets 233  150 - 400 (K/uL)  DIFFERENTIAL      Component Value Range   Neutrophils Relative 56  43 - 77 (%)   Neutro Abs 2.6  1.7 - 7.7 (K/uL)   Lymphocytes Relative 32  12 - 46 (%)   Lymphs Abs 1.5  0.7 - 4.0 (K/uL)   Monocytes Relative 9  3 - 12 (%)   Monocytes Absolute 0.4  0.1 - 1.0 (K/uL)   Eosinophils Relative 2  0 - 5 (%)   Eosinophils Absolute 0.1  0.0 - 0.7 (K/uL)   Basophils Relative 0  0 - 1 (%)   Basophils Absolute 0.0  0.0 - 0.1 (K/uL)  COMPREHENSIVE METABOLIC PANEL      Component Value Range   Sodium 137  135 - 145 (mEq/L)   Potassium 3.7  3.5 - 5.1 (mEq/L)   Chloride 101  96 -  112 (mEq/L)   CO2 26  19 - 32 (mEq/L)   Glucose, Bld 110 (*) 70 - 99 (mg/dL)   BUN 11  6 - 23 (mg/dL)   Creatinine, Ser 1.30  0.50 - 1.10 (mg/dL)   Calcium 9.4  8.4 - 86.5 (mg/dL)   Total Protein 7.9  6.0 - 8.3 (g/dL)  Albumin 3.5  3.5 - 5.2 (g/dL)   AST 34  0 - 37 (U/L)   ALT 32  0 - 35 (U/L)   Alkaline Phosphatase 113  39 - 117 (U/L)   Total Bilirubin 0.2 (*) 0.3 - 1.2 (mg/dL)   GFR calc non Af Amer >90  >90 (mL/min)   GFR calc Af Amer >90  >90 (mL/min)  LIPASE, BLOOD      Component Value Range   Lipase 11  11 - 59 (U/L)   Ct Abdomen Pelvis W Contrast 08/15/2011  *RADIOLOGY REPORT*  Clinical Data: Pain and nausea.  CT ABDOMEN AND PELVIS WITH CONTRAST  Technique:  Multidetector CT imaging of the abdomen and pelvis was performed following the standard protocol during bolus administration of intravenous contrast.  Contrast: OMNIPAQUE IOHEXOL 300 MG/ML  SOLN  Comparison: CT of abdomen and pelvis 07/01/2009.  Findings:  Lung Bases: In the lateral segment of the right middle lobe there is a 4 mm pulmonary nodule (image 51 of series 3) which is unchanged compared to 07/01/2009, and is presumably a benign subpleural lymph node (this requires no further imaging follow-up at this time).  Minimal dependent atelectasis in the lower lobes and inferior segment of the lingula.  Otherwise, unremarkable.  Abdomen/Pelvis:  Status post cholecystectomy.  Diffusely decreased attenuation throughout the hepatic parenchyma, suggestive of hepatic steatosis.  No definite focal hepatic lesions are identified.  The enhanced appearance of the pancreas, spleen, bilateral adrenal glands and bilateral kidneys is unremarkable. There is a small splenule posterior to the inferior aspect of the spleen.  There is no ascites or pneumoperitoneum and no pathologic distension of bowel.  No definite pathologic adenopathy identified within the abdomen or pelvis.  There are numerous colonic diverticula, most pronounced in the region  of the sigmoid colon, without surrounding inflammatory changes to suggest acute diverticulitis at this time.  The patient is status post total abdominal hysterectomy and bilateral salpingo-oophorectomy.  The urinary bladder is unremarkable in appearance.  Musculoskeletal: There are no aggressive appearing lytic or blastic lesions noted in the visualized portions of the skeleton.  IMPRESSION: 1.  No acute findings in the abdomen or pelvis to account for the patient's symptoms. 2.  Mild diffuse decreased attenuation throughout the hepatic parenchyma suggestive of hepatic steatosis. 3.  Status post cholecystectomy, hysterectomy and bilateral salpingo-oophorectomy. 4.  Colonic diverticulosis (most severe in the sigmoid colon) without surrounding inflammatory changes to suggest acute diverticulitis at this time. 5.  Additional incidental findings, as above.  Original Report Authenticated By: Florencia Reasons, M.D.      4:38 PM:  Labs and CT without acute findings.  Stool is heme negative.  Has tol PO while in the ED.  No N/V or stooling while in the ED.  Dx testing d/w pt.  Questions answered.  Verb understanding, agreeable to d/c home with outpt f/u.           Laray Anger, DO 08/17/11 1645

## 2011-08-15 NOTE — ED Notes (Signed)
Rectal exam negative for blood.

## 2011-08-15 NOTE — ED Notes (Signed)
Assisted MD with rectal exam. Patient tolerated well.  

## 2011-08-15 NOTE — ED Notes (Signed)
Pt c/o abd pain, nausea that started today and some rectal bleeding, Dr. Clarene Duke in prior to RN, see EDP assessment for further.

## 2011-08-15 NOTE — ED Notes (Signed)
Patient transported to CT 

## 2011-08-15 NOTE — Discharge Instructions (Signed)
RESOURCE GUIDE  Dental Problems  Patients with Medicaid: [facility]                     [facility] [address]                                           [address] Phone:  [phone]                                                  Phone:  [phone]  If unable to pay or uninsured, contact:  [facility] or [facility] to become qualified for the adult dental clinic.  Chronic Pain Problems Contact [facility]  [phone] Patients need to be referred by their primary care doctor.  Insufficient Money for Medicine Contact [employer]:  call "211" or [facility] 271-5999.  No Primary Care Doctor Call [facility]  [phone] Other agencies that provide inexpensive medical care    [facility]  [phone]    [facility]  [phone]    [facility]  [phone]    Women's Clinic  [phone]    [facility]  [phone]    [facility]  [phone]  Psychological Services [facility]  [phone] [facility]  [phone] [facility]   [phone] (emergency services [phone])  Substance Abuse Resources [facility]  336-882-2125 [facility] 336-784-9470 [facility] 336-285-9073 [facility] 336-845-3988 Residential & Outpatient Substance Abuse Program  800-659-3381  Abuse/Neglect [place] Child Abuse Hotline (336) 641-3795 [place] Child Abuse Hotline 800-378-5315 (After Hours)  Emergency Shelter [facility] (336) 271-5985  Maternity Homes [facility] (336) 275-9566 [facility] (704) 372-4663  MRSA Hotline #:   832-7006    [place] [facility]  [facility] [place]     [employer]                          [facility] [address] [place]                       [address]      [address]  [place]                                                [place]                            [place] Phone:  [phone]                                   Phone:  [phone]                 Phone:  [phone]  [facility] Phone:  [phone]    [facility] Child Abuse Hotline 437 781 1797 2545560336 (After Hours)   Eat a bland diet, avoiding greasy, fatty, fried foods, as well as spicy and acidic foods or beverages.  Avoid eating within the hour or 2 before going to bed or laying down.  Also avoid teas, colas, coffee, chocolate, pepermint and spearment.  Take over the counter pepcid, one tablet by mouth twice a day, for the next 2 to 3 weeks.  May also take over the counter maalox/mylanta, as directed on packaging, as needed for discomfort.  Take the prescriptions as directed.  Call your regular medical doctor and the GI doctor on Monday to schedule a follow up appointment this week.  Return to the Emergency Department immediately if worsening.

## 2011-08-16 LAB — URINE CULTURE

## 2011-08-17 ENCOUNTER — Ambulatory Visit (HOSPITAL_COMMUNITY)
Admission: RE | Admit: 2011-08-17 | Discharge: 2011-08-17 | Disposition: A | Discharge status: Home / Self Care | Payer: BC Managed Care – PPO | Source: Ambulatory Visit | Attending: Internal Medicine | Admitting: Internal Medicine

## 2011-08-17 DIAGNOSIS — M546 Pain in thoracic spine: Secondary | ICD-10-CM | POA: Insufficient documentation

## 2011-08-17 DIAGNOSIS — R0989 Other specified symptoms and signs involving the circulatory and respiratory systems: Secondary | ICD-10-CM | POA: Insufficient documentation

## 2011-08-17 DIAGNOSIS — R0609 Other forms of dyspnea: Secondary | ICD-10-CM | POA: Insufficient documentation

## 2011-08-17 DIAGNOSIS — M549 Dorsalgia, unspecified: Secondary | ICD-10-CM

## 2011-08-17 DIAGNOSIS — IMO0002 Reserved for concepts with insufficient information to code with codable children: Secondary | ICD-10-CM | POA: Insufficient documentation

## 2011-08-17 LAB — OCCULT BLOOD, POC DEVICE: Fecal Occult Bld: NEGATIVE

## 2011-10-18 ENCOUNTER — Encounter (HOSPITAL_COMMUNITY): Payer: Self-pay | Admitting: Emergency Medicine

## 2011-10-18 ENCOUNTER — Emergency Department (HOSPITAL_COMMUNITY): Payer: BC Managed Care – PPO

## 2011-10-18 ENCOUNTER — Emergency Department (HOSPITAL_COMMUNITY)
Admission: EM | Admit: 2011-10-18 | Discharge: 2011-10-18 | Disposition: A | Discharge status: Home / Self Care | Payer: BC Managed Care – PPO | Attending: Emergency Medicine | Admitting: Emergency Medicine

## 2011-10-18 DIAGNOSIS — G43909 Migraine, unspecified, not intractable, without status migrainosus: Secondary | ICD-10-CM

## 2011-10-18 DIAGNOSIS — R209 Unspecified disturbances of skin sensation: Secondary | ICD-10-CM | POA: Insufficient documentation

## 2011-10-18 HISTORY — DX: Migraine, unspecified, not intractable, without status migrainosus: G43.909

## 2011-10-18 HISTORY — DX: Gastro-esophageal reflux disease without esophagitis: K21.9

## 2011-10-18 HISTORY — DX: Cervicalgia: M54.2

## 2011-10-18 HISTORY — DX: Other chronic pain: G89.29

## 2011-10-18 MED ORDER — ONDANSETRON HCL 4 MG/2ML IJ SOLN
4.0000 mg | Freq: Once | INTRAMUSCULAR | Status: AC
  Administered 2011-10-18: 4 mg via INTRAVENOUS
  Filled 2011-10-18: qty 2

## 2011-10-18 MED ORDER — SODIUM CHLORIDE 0.9 % IV BOLUS (SEPSIS)
1000.0000 mL | Freq: Once | INTRAVENOUS | Status: AC
  Administered 2011-10-18: 1000 mL via INTRAVENOUS

## 2011-10-18 MED ORDER — HYDROMORPHONE HCL PF 1 MG/ML IJ SOLN
1.0000 mg | Freq: Once | INTRAMUSCULAR | Status: AC
  Administered 2011-10-18: 1 mg via INTRAVENOUS
  Filled 2011-10-18: qty 1

## 2011-10-18 MED ORDER — METOCLOPRAMIDE HCL 5 MG/ML IJ SOLN
10.0000 mg | Freq: Once | INTRAMUSCULAR | Status: AC
  Administered 2011-10-18: 10 mg via INTRAVENOUS
  Filled 2011-10-18: qty 2

## 2011-10-18 MED ORDER — DIVALPROEX SODIUM 250 MG PO DR TAB
250.0000 mg | DELAYED_RELEASE_TABLET | Freq: Once | ORAL | Status: AC
  Administered 2011-10-18: 250 mg via ORAL
  Filled 2011-10-18: qty 1

## 2011-10-18 MED ORDER — ONDANSETRON 4 MG PO TBDP: 4.0000 mg | ORAL_TABLET | Freq: Once | ORAL | Status: DC

## 2011-10-18 MED ORDER — DIPHENHYDRAMINE HCL 50 MG/ML IJ SOLN
25.0000 mg | Freq: Once | INTRAMUSCULAR | Status: AC
  Administered 2011-10-18: 25 mg via INTRAVENOUS
  Filled 2011-10-18: qty 1

## 2011-10-18 MED ORDER — KETOROLAC TROMETHAMINE 30 MG/ML IJ SOLN
30.0000 mg | Freq: Once | INTRAMUSCULAR | Status: AC
  Administered 2011-10-18: 30 mg via INTRAVENOUS
  Filled 2011-10-18: qty 1

## 2011-10-18 MED ORDER — OXYCODONE-ACETAMINOPHEN 5-325 MG PO TABS
1.0000 | ORAL_TABLET | Freq: Once | ORAL | Status: AC
  Administered 2011-10-18: 1 via ORAL
  Filled 2011-10-18: qty 1

## 2011-10-18 NOTE — ED Provider Notes (Signed)
History     CSN: [id number]  Arrival date & time 10/18/11  1104   First MD Initiated Contact with Patient 10/18/11 1120      Chief Complaint  Patient presents with  . Headache    (Consider location/radiation/quality/duration/timing/severity/associated sxs/prior treatment) HPI Comments: [name] presents with a generalized headache which woke her this morning.  She reports her vision was "grayed out" briefly but now normal vision.  She described sharp and intense generalized headache which is constant and did not resolve with ibuprofen.  She does have a history of migraine headaches and is also treated for MS at [facility] by Dr. Renne Crigler of neurology.  She reports numbness in her left face and left upper extremity which she has experienced before with previous headaches, but she does not have this symptom every time she has migraine episodes.  She has had no weakness or dizziness since awaking today.  She has nausea but no emesis.  She also denies fevers or chills.  Patient is a 46 y.o. female presenting with migraine. The history is provided by the patient.  Migraine Associated symptoms include headaches, nausea, numbness and a visual change. Pertinent negatives include no abdominal pain, arthralgias, chest pain, chills, congestion, fever, joint swelling, neck pain, rash, sore throat, swollen glands, vertigo, vomiting or weakness. Nothing aggravates the symptoms.    Past Medical History  Diagnosis Date  . Asthma   . MS (multiple sclerosis)   . Migraine headache   . GERD (gastroesophageal reflux disease)   . Chronic neck pain     Past Surgical History  Procedure Date  . Neck surgery   . Appendectomy   . Cholecystectomy   . Abdominal hysterectomy   . Mandible surgery     No family history on file.  History  Substance Use Topics  . Smoking status: Never Smoker   . Smokeless tobacco: Not on file  . Alcohol Use: Yes     occasionally    OB History    Grav Para Term Preterm  Abortions TAB SAB Ect Mult Living                  Review of Systems  Constitutional: Negative for fever and chills.  HENT: Negative for congestion, sore throat and neck pain.   Eyes: Negative.   Respiratory: Negative for chest tightness and shortness of breath.   Cardiovascular: Negative for chest pain.  Gastrointestinal: Positive for nausea. Negative for vomiting and abdominal pain.  Genitourinary: Negative.   Musculoskeletal: Negative for joint swelling and arthralgias.  Skin: Negative.  Negative for rash and wound.  Neurological: Positive for numbness and headaches. Negative for dizziness, vertigo, weakness and light-headedness.  Hematological: Negative.   Psychiatric/Behavioral: Negative.     Allergies  Peanut-containing drug products; Latex; Prednisone; and Nexium  Home Medications   Current Outpatient Rx  Name Route Sig Dispense Refill  . ALBUTEROL SULFATE HFA 108 (90 BASE) MCG/ACT IN AERS Inhalation Inhale 2 puffs into the lungs every 6 (six) hours as needed. For Asthma    . BACLOFEN 10 MG PO TABS Oral Take 10 mg by mouth 3 (three) times daily.     Marland Kitchen CITALOPRAM HYDROBROMIDE 40 MG PO TABS Oral Take 40 mg by mouth at bedtime.     Marland Kitchen DIAZEPAM 2 MG PO TABS Oral Take 2 mg by mouth daily as needed. For spasms    . GABAPENTIN 300 MG PO CAPS Oral Take 300 mg by mouth 3 (three) times daily.      Marland Kitchen  IBUPROFEN 200 MG PO TABS Oral Take 600 mg by mouth every other day. Patient takes before she gives herself her injection.     . INTERFERON BETA-1A 44 MCG/0.5ML [place] SOLN Subcutaneous Inject 44 mcg into the skin 3 (three) times a week. Take on Tuesdays, Thursdays and Sundays    . OXYBUTYNIN CHLORIDE 5 MG PO TABS Oral Take 5 mg by mouth 2 (two) times daily.      . TRAZODONE HCL 100 MG PO TABS Oral Take 200 mg by mouth at bedtime.      BP 138/74  Pulse 78  Temp 98.1 F (36.7 C) (Oral)  Resp 20  Ht 5\' 5"  (1.651 m)  Wt 220 lb 2 oz (99.848 kg)  BMI 36.63 kg/m2  SpO2 99%  Physical Exam    Nursing note and vitals reviewed. Constitutional: She is oriented to person, place, and time. She appears well-developed and well-nourished. No distress.  HENT:  Head: Normocephalic and atraumatic.  Mouth/Throat: Oropharynx is clear and moist.  Eyes: EOM are normal. Pupils are equal, round, and reactive to light.  Neck: Normal range of motion and full passive range of motion without pain. Neck supple. Normal range of motion present.  Cardiovascular: Normal rate and normal heart sounds.   Pulmonary/Chest: Effort normal.  Abdominal: Soft. There is no tenderness.  Musculoskeletal: Normal range of motion.  Lymphadenopathy:    She has no cervical adenopathy.  Neurological: She is alert and oriented to person, place, and time. She has normal strength. She displays normal reflexes. No cranial nerve deficit. Coordination and gait normal. GCS eye subscore is 4. GCS verbal subscore is 5. GCS motor subscore is 6.       Normal heel-shin, normal rapid alternating movements. Cranial nerves III-XII intact.  No pronator drift.  She does have decreased sensation to fine touch of left face including forehead and left forearm and hand.  She has no motor deficits.  Skin: Skin is warm and dry. No rash noted. She is not diaphoretic.  Psychiatric: She has a normal mood and affect. Her speech is normal and behavior is normal. Thought content normal. Cognition and memory are normal.    ED Course  Procedures (including critical care time)  Labs Reviewed - No data to display No results found.   No diagnosis found.  12:55 PM headache 8/10 after receiving 1 mg Dilaudid IV.  Repeat x 1.  NS 1 liter given.  Repeat dilaudid 1 mg IV x 1 - headache reported as still 8/10,  Although patient appears very comfortable with no distress.  She was given benadryl 25 mg IV,  reglan 10 IV with headache still at 8/10.  Pt sleepy.    CT head completed - negative.    Call placed to Dr Renne Crigler with [facility].  Discussed refractory  headache - Dr Renne Crigler advised tx for typical migraine - sx not associated with MS nor would it be side effect from her rebif .  Suggested depakote  If migraine cocktail was not effective.  Depakote 250 mg given PO.  Also given toradol 30 mg IV.  Pt ready for dc home after this tx.  States headache 8/10 still, but wants to go home and sleep. Patient appears to be in no distress.  Neuro exam at dc- equal grip strength,  Cranial nerves intact,  EOM's normal,  PERRL,    MDM  Review of pt's chart from previous ed visits reveals pt has had similar migraine presentations including left facial and left  sided numbness.  Ct scan reviewed and normal.          Burgess Amor, PA 10/19/11 [id number]

## 2011-10-18 NOTE — ED Notes (Signed)
Pt c/o severe generalized ha upon waking this am at 0600. Pt also c/o vision "blacking out". Pt also states the left side of her face feels "tense".

## 2011-10-18 NOTE — ED Notes (Signed)
Pt c/o numbness in her left face, "losing her vision" at times, and severe headache since 6 am. Pt states that it is not her usual migraine headaches. Pt has facial symmetry with smiling, squinting and sticking out her tongue. No weakness noted in her arms or legs. Pt denies nausea, vomiting or photophobia.

## 2011-10-21 NOTE — ED Provider Notes (Signed)
Medical screening examination/treatment/procedure(s) were performed by non-physician practitioner and as supervising physician I was immediately available for consultation/collaboration.   Laray Anger, DO 10/21/11 [phone]

## 2011-12-24 ENCOUNTER — Encounter (HOSPITAL_COMMUNITY): Payer: Self-pay | Admitting: Emergency Medicine

## 2011-12-24 ENCOUNTER — Emergency Department (HOSPITAL_COMMUNITY)
Admission: EM | Admit: 2011-12-24 | Discharge: 2011-12-24 | Disposition: A | Discharge status: Home / Self Care | Payer: BC Managed Care – PPO | Attending: Emergency Medicine | Admitting: Emergency Medicine

## 2011-12-24 DIAGNOSIS — Z90710 Acquired absence of both cervix and uterus: Secondary | ICD-10-CM | POA: Insufficient documentation

## 2011-12-24 DIAGNOSIS — K219 Gastro-esophageal reflux disease without esophagitis: Secondary | ICD-10-CM | POA: Insufficient documentation

## 2011-12-24 DIAGNOSIS — Z91040 Latex allergy status: Secondary | ICD-10-CM | POA: Insufficient documentation

## 2011-12-24 DIAGNOSIS — Z888 Allergy status to other drugs, medicaments and biological substances status: Secondary | ICD-10-CM | POA: Insufficient documentation

## 2011-12-24 DIAGNOSIS — G35 Multiple sclerosis: Secondary | ICD-10-CM | POA: Insufficient documentation

## 2011-12-24 DIAGNOSIS — G4730 Sleep apnea, unspecified: Secondary | ICD-10-CM | POA: Insufficient documentation

## 2011-12-24 DIAGNOSIS — J45909 Unspecified asthma, uncomplicated: Secondary | ICD-10-CM | POA: Insufficient documentation

## 2011-12-24 DIAGNOSIS — Z91010 Allergy to peanuts: Secondary | ICD-10-CM | POA: Insufficient documentation

## 2011-12-24 DIAGNOSIS — G43909 Migraine, unspecified, not intractable, without status migrainosus: Secondary | ICD-10-CM | POA: Insufficient documentation

## 2011-12-24 DIAGNOSIS — Z79899 Other long term (current) drug therapy: Secondary | ICD-10-CM | POA: Insufficient documentation

## 2011-12-24 HISTORY — DX: Sleep apnea, unspecified: G47.30

## 2011-12-24 MED ORDER — DIPHENHYDRAMINE HCL 50 MG/ML IJ SOLN
25.0000 mg | Freq: Once | INTRAMUSCULAR | Status: AC
  Administered 2011-12-24: 25 mg via INTRAVENOUS
  Filled 2011-12-24: qty 2

## 2011-12-24 MED ORDER — METOCLOPRAMIDE HCL 5 MG/ML IJ SOLN
10.0000 mg | Freq: Once | INTRAMUSCULAR | Status: AC
  Administered 2011-12-24: 10 mg via INTRAVENOUS
  Filled 2011-12-24: qty 2

## 2011-12-24 MED ORDER — DEXAMETHASONE SODIUM PHOSPHATE 4 MG/ML IJ SOLN
10.0000 mg | Freq: Once | INTRAMUSCULAR | Status: AC
  Administered 2011-12-24: 10 mg via INTRAVENOUS
  Filled 2011-12-24: qty 3

## 2011-12-24 MED ORDER — SODIUM CHLORIDE 0.9 % IV SOLN
INTRAVENOUS | Status: DC
  Administered 2011-12-24: 11:00:00 via INTRAVENOUS

## 2011-12-24 NOTE — ED Notes (Signed)
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

## 2011-12-24 NOTE — ED Provider Notes (Signed)
History   This chart was scribed for Carleene Cooper III, MD by Sofie Rower. The patient was seen in room APA18/APA18 and the patient's care was started at 10:26AM.    CSN: [id number]  Arrival date & time 12/24/11  0932   First MD Initiated Contact with Patient 12/24/11 1026      Chief Complaint  Patient presents with  . Headache    (Consider location/radiation/quality/duration/timing/severity/associated sxs/prior treatment) Patient is a 46 y.o. female presenting with headaches. The history is provided by the patient. No language interpreter was used.  Headache  This is a new problem. The current episode started 2 days ago. The problem has been gradually worsening. The headache is associated with an unknown factor. The pain is located in the occipital (Left occipital region. ) region. The quality of the pain is described as sharp. The pain is moderate. The pain does not radiate. Pertinent negatives include no nausea and no vomiting. She has tried nothing for the symptoms. The treatment provided no relief.    [name] is a 46 y.o. female , with a hx of migraines, who presents to the Emergency Department complaining of sudden, progressively worsening, headache, located at the back of the head, onset two days ago, with associated symptoms of blurred vision located at her left eye. The pt reports she has experienced migraines previously, however, the headache at present is not similar to those in the past. In addition, the pt informs that the headache is a stabbing sensation, which is disturbing the vision in her left eye. The pt is able to differentiate between light and dark in the left eye, however, she reports she cannot distinguish the identity or number of shapes and objects. The pt has a hx of multiple sclerosis, chronic neck pain, sleep apnea, and neck surgery.   The pt denies taking any pain medication for her headache at present.   The pt does not smoke, however, she does drink alcohol  on occasion.   PCP is Dr. Sherwood Gambler.   Past Medical History  Diagnosis Date  . Asthma   . MS (multiple sclerosis)   . Migraine headache   . GERD (gastroesophageal reflux disease)   . Chronic neck pain   . Sleep apnea     Past Surgical History  Procedure Date  . Neck surgery   . Appendectomy   . Cholecystectomy   . Abdominal hysterectomy   . Mandible surgery     History reviewed. No pertinent family history.  History  Substance Use Topics  . Smoking status: Never Smoker   . Smokeless tobacco: Not on file  . Alcohol Use: Yes     occasionally    OB History    Grav Para Term Preterm Abortions TAB SAB Ect Mult Living                  Review of Systems  Gastrointestinal: Negative for nausea and vomiting.  Neurological: Positive for headaches.  All other systems reviewed and are negative.    Allergies  Peanut-containing drug products; Latex; Prednisone; and Nexium  Home Medications   Current Outpatient Rx  Name Route Sig Dispense Refill  . ALBUTEROL SULFATE HFA 108 (90 BASE) MCG/ACT IN AERS Inhalation Inhale 2 puffs into the lungs every 6 (six) hours as needed. For Asthma    . BACLOFEN 10 MG PO TABS Oral Take 10 mg by mouth 3 (three) times daily.     Marland Kitchen CITALOPRAM HYDROBROMIDE 40 MG PO TABS Oral Take  40 mg by mouth at bedtime.     Marland Kitchen DIAZEPAM 2 MG PO TABS Oral Take 2 mg by mouth daily as needed. For spasms    . GABAPENTIN 300 MG PO CAPS Oral Take 300 mg by mouth 3 (three) times daily.      Marland Kitchen HYDROCODONE-ACETAMINOPHEN 10-500 MG PO TABS Oral Take 1 tablet by mouth every 6 (six) hours as needed. Pain.    . IBUPROFEN 200 MG PO TABS Oral Take 600 mg by mouth every other day. Patient takes before she gives herself her injection.     . INTERFERON BETA-1A 44 MCG/0.5ML [place] SOLN Subcutaneous Inject 44 mcg into the skin 3 (three) times a week. Take on Tuesdays, Thursdays and Sundays    . OXYBUTYNIN CHLORIDE 5 MG PO TABS Oral Take 5 mg by mouth 2 (two) times daily.      .  TRAZODONE HCL 100 MG PO TABS Oral Take 200 mg by mouth at bedtime.      BP 120/76  Pulse 79  Temp 97.7 F (36.5 C) (Oral)  Resp 18  Ht 5\' 5"  (1.651 m)  Wt 210 lb (95.255 kg)  BMI 34.95 kg/m2  SpO2 99%  Physical Exam  Nursing note and vitals reviewed. Constitutional: She is oriented to person, place, and time. She appears well-developed and well-nourished.  HENT:  Head: Atraumatic.  Nose: Nose normal.       Headache pain is localized at the left occipital region.  Eyes: EOM are normal. Pupils are equal, round, and reactive to light.       No fundus abnormality.   Cardiovascular: Normal rate, regular rhythm and normal heart sounds.   Pulmonary/Chest: Effort normal and breath sounds normal.  Abdominal: Soft. Bowel sounds are normal.  Musculoskeletal: Normal range of motion.  Neurological: She is alert and oriented to person, place, and time.  Skin: Skin is warm and dry.  Psychiatric: She has a normal mood and affect. Her behavior is normal.    ED Course  Procedures (including critical care time)  DIAGNOSTIC STUDIES: Oxygen Saturation is 99% on room air, normal by my interpretation.    COORDINATION OF CARE:    10:42AM- Pain management and follow up with neurologist discussed with patient Pt agrees to treatment.   11:32AM- Phone consultation with Dr. Renne Crigler, neurologist at [facility] concerning patient treatment. The pt reports her headache pain has dissipated and her vision has increased. The pt is due for an appointment with Dr. Renne Crigler on 12/28/11. Pt agrees with treatment.   12:52PM- Recheck. Pt reports feeling better. Will discharge and recommend to rest for the reminder of the afternoon.       1. Migraine headache      I personally performed the services described in this documentation, which was scribed in my presence. The recorded information has been reviewed and considered.  Osvaldo Human, M.D.     Carleene Cooper III, MD 12/26/11 0100

## 2012-01-06 DIAGNOSIS — M5481 Occipital neuralgia: Secondary | ICD-10-CM | POA: Insufficient documentation

## 2012-01-11 DIAGNOSIS — G894 Chronic pain syndrome: Secondary | ICD-10-CM

## 2012-01-11 DIAGNOSIS — M5414 Radiculopathy, thoracic region: Secondary | ICD-10-CM | POA: Insufficient documentation

## 2012-01-11 DIAGNOSIS — G8929 Other chronic pain: Secondary | ICD-10-CM | POA: Insufficient documentation

## 2012-01-11 HISTORY — DX: Chronic pain syndrome: G89.4

## 2012-01-29 ENCOUNTER — Other Ambulatory Visit (HOSPITAL_COMMUNITY): Payer: Self-pay | Admitting: Internal Medicine

## 2012-01-29 DIAGNOSIS — R1310 Dysphagia, unspecified: Secondary | ICD-10-CM

## 2012-01-29 DIAGNOSIS — R609 Edema, unspecified: Secondary | ICD-10-CM

## 2012-01-29 DIAGNOSIS — K219 Gastro-esophageal reflux disease without esophagitis: Secondary | ICD-10-CM

## 2012-02-01 ENCOUNTER — Ambulatory Visit (HOSPITAL_COMMUNITY)
Admission: RE | Admit: 2012-02-01 | Discharge: 2012-02-01 | Disposition: A | Discharge status: Home / Self Care | Payer: BC Managed Care – PPO | Source: Ambulatory Visit | Attending: Internal Medicine | Admitting: Internal Medicine

## 2012-02-01 DIAGNOSIS — R609 Edema, unspecified: Secondary | ICD-10-CM

## 2012-02-01 DIAGNOSIS — R1310 Dysphagia, unspecified: Secondary | ICD-10-CM

## 2012-02-01 DIAGNOSIS — K219 Gastro-esophageal reflux disease without esophagitis: Secondary | ICD-10-CM

## 2012-02-01 MED ORDER — IOHEXOL 300 MG/ML  SOLN
80.0000 mL | Freq: Once | INTRAMUSCULAR | Status: AC | PRN
  Administered 2012-02-01: 80 mL via INTRAVENOUS

## 2012-02-02 ENCOUNTER — Ambulatory Visit (HOSPITAL_COMMUNITY)
Admission: RE | Admit: 2012-02-02 | Discharge: 2012-02-02 | Disposition: A | Discharge status: Home / Self Care | Payer: BC Managed Care – PPO | Source: Ambulatory Visit | Attending: Internal Medicine | Admitting: Internal Medicine

## 2012-02-02 DIAGNOSIS — R1310 Dysphagia, unspecified: Secondary | ICD-10-CM | POA: Insufficient documentation

## 2012-02-02 DIAGNOSIS — K219 Gastro-esophageal reflux disease without esophagitis: Secondary | ICD-10-CM

## 2012-02-02 DIAGNOSIS — R609 Edema, unspecified: Secondary | ICD-10-CM

## 2012-02-02 DIAGNOSIS — K224 Dyskinesia of esophagus: Secondary | ICD-10-CM | POA: Insufficient documentation

## 2012-02-02 DIAGNOSIS — K449 Diaphragmatic hernia without obstruction or gangrene: Secondary | ICD-10-CM | POA: Insufficient documentation

## 2012-02-22 ENCOUNTER — Encounter: Payer: Self-pay | Admitting: Internal Medicine

## 2012-02-23 ENCOUNTER — Ambulatory Visit (INDEPENDENT_AMBULATORY_CARE_PROVIDER_SITE_OTHER): Payer: BC Managed Care – PPO | Admitting: Gastroenterology

## 2012-02-23 ENCOUNTER — Encounter: Payer: Self-pay | Admitting: Gastroenterology

## 2012-02-23 VITALS — BP 141/81 | HR 90 | Temp 97.6°F | Ht 65.0 in | Wt 221.8 lb

## 2012-02-23 DIAGNOSIS — K5900 Constipation, unspecified: Secondary | ICD-10-CM

## 2012-02-23 DIAGNOSIS — K7689 Other specified diseases of liver: Secondary | ICD-10-CM

## 2012-02-23 DIAGNOSIS — K219 Gastro-esophageal reflux disease without esophagitis: Secondary | ICD-10-CM

## 2012-02-23 DIAGNOSIS — R1310 Dysphagia, unspecified: Secondary | ICD-10-CM

## 2012-02-23 HISTORY — DX: Dysphagia, unspecified: R13.10

## 2012-02-23 MED ORDER — LUBIPROSTONE 24 MCG PO CAPS: 24.0000 ug | ORAL_CAPSULE | Freq: Two times a day (BID) | ORAL | Status: DC

## 2012-02-23 MED ORDER — DEXLANSOPRAZOLE 60 MG PO CPDR: 60.0000 mg | DELAYED_RELEASE_CAPSULE | Freq: Every day | ORAL | Status: DC

## 2012-02-23 NOTE — Assessment & Plan Note (Signed)
Chronic. Finances are an issue.  Start Amitiza 24 mcg po BID; pt on narcotics. 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 Last TCS in 2011, which is reassuring.   3 mos f/u.

## 2012-02-23 NOTE — Assessment & Plan Note (Signed)
Normal LFTs on file from May 2013. Recheck in 1 year. CT on file from may 2013 with diffuse hepatic steatosis. Pt is actively trying to lose weight. Applauded on efforts.  Follow LFTs.

## 2012-02-23 NOTE — Patient Instructions (Addendum)
For Reflux:  Start taking Dexilant once daily in the morning. I have sent a prescription to your pharmacy and provided samples. Your insurance may need prior authorization to approve, and we will work on this.  Follow the reflux diet provided.   For constipation: Follow a high fiber diet.  Start taking Amitiza 1 capsule WITH FOOD once a day for three days. Increase to twice a day if you tolerate it after three days. Watch for diarrhea. Make sure to take with food to avoid nausea.  We have set you up for an upper endoscopy with Dr. Jena Gauss in the near future. Please call us in about 10 days to let us know if your swallowing has improved. If it has, we may be able to postpone the procedure.   Contact Dr. Sherwood Gambler about your blood pressure!    Diet for Gastroesophageal Reflux Disease, Adult Reflux (acid reflux) is when acid from your stomach flows up into the esophagus. When acid comes in contact with the esophagus, the acid causes irritation and soreness (inflammation) in the esophagus. When reflux happens often or so severely that it causes damage to the esophagus, it is called gastroesophageal reflux disease (GERD). Nutrition therapy can help ease the discomfort of GERD. FOODS OR DRINKS TO AVOID OR LIMIT  Smoking or chewing tobacco. Nicotine is one of the most potent stimulants to acid production in the gastrointestinal tract.   Caffeinated and decaffeinated coffee and black tea.   Regular or low-calorie carbonated beverages or energy drinks (caffeine-free carbonated beverages are allowed).     Strong spices, such as black pepper, white pepper, red pepper, cayenne, curry powder, and chili powder.   Peppermint or spearmint.   Chocolate.   High-fat foods, including meats and fried foods. Extra added fats including oils, butter, salad dressings, and nuts. Limit these to less than 8 tsp per day.   Fruits and vegetables if they are not tolerated, such as citrus fruits or tomatoes.   Alcohol.     Any food that seems to aggravate your condition.  If you have questions regarding your diet, call your caregiver or a registered dietitian. OTHER THINGS THAT MAY HELP GERD INCLUDE:    Eating your meals slowly, in a relaxed setting.   Eating 5 to 6 small meals per day instead of 3 large meals.   Eliminating food for a period of time if it causes distress.   Not lying down until 3 hours after eating a meal.   Keeping the head of your bed raised 6 to 9 inches (15 to 23 cm) by using a foam wedge or blocks under the legs of the bed. Lying flat may make symptoms worse.   Being physically active. Weight loss may be helpful in reducing reflux in overweight or obese adults.   Wear loose fitting clothing  EXAMPLE MEAL PLAN This meal plan is approximately 2,000 calories based on https://www.bernard.org/ meal planning guidelines. Breakfast   cup cooked oatmeal.   1 cup strawberries.   1 cup low-fat milk.   1 oz almonds.  Snack  1 cup cucumber slices.   6 oz yogurt (made from low-fat or fat-free milk).  Lunch  2 slice whole-wheat bread.   2 oz sliced [place].   2 tsp mayonnaise.   1 cup blueberries.   1 cup snap peas.  Snack  6 whole-wheat crackers.   1 oz string cheese.  Dinner   cup brown rice.   1 cup mixed veggies.   1 tsp olive  oil.   3 oz grilled fish.  Document Released: 03/16/2005 Document Revised: 06/08/2011 Document Reviewed: 01/30/2011 [facility] Patient Information 2013 [place], [place].

## 2012-02-23 NOTE — Progress Notes (Signed)
Pt is aware of OV on 05/25/12 at 1030 with AS and appt card was mailed

## 2012-02-23 NOTE — Progress Notes (Unsigned)
Please have pt return in 3 mos for f/u re: constipation.  Also, [name], I forgot to add Phenergan 25 mg on call for EGD with RMR. Thanks!

## 2012-02-23 NOTE — Assessment & Plan Note (Signed)
See Dysphagia.  

## 2012-02-23 NOTE — Progress Notes (Signed)
Faxed to PCP

## 2012-02-23 NOTE — Assessment & Plan Note (Signed)
46 year old female with hx of remote non-critical Schatzki's ring (2007, non-manipulated), most recent EGD in Feb 2011 with +H.pylori gastritis, normal esophagus, presenting now with worsening dysphagia over past month in the setting of known reflux. She is not on a PPI due to cost; she eats Tums daily. +nocturnal reflux noted. UGI/BPE as noted in HPI, without obstruction of tablet. Query Zenker's diverticulum but not definitively noted; also, age-related impairment of esophageal motility noted. At this point, I question her symptoms are secondary to uncontrolled GERD, and I'd like to start her on Dexilant daily. She has tried and failed Protonix, Prilosec, allergic to Nexium. However, it is quite possible her dysphagia could be secondary to occult cervical web, Schatzki's ring, ?small Zenker's diverticulum (see barium study for full report, as tiny residual of contrast was noted at cervical esophagus as intraluminal).   Trial of Dexilant daily. PR in 10 days. Tentative EGD/ED scheduled in 4 weeks. Will cancel if symptoms resolve with PPI.   Proceed with possible upper endoscopy and dilation in the near future with Dr. Jena Gauss. The risks, benefits, and alternatives have been discussed in detail with patient. They have stated understanding and desire to proceed.  Phenergan 25 mg IV on call due to polypharmacy.

## 2012-02-23 NOTE — Progress Notes (Signed)
 Referring Provider: Fusco, Lawrence, MD Primary Care Physician:  FUSCO,LAWRENCE J., MD Primary Gastroenterologist:  Dr. Rourk   Chief Complaint  Patient presents with  . Dysphagia    HPI:   46-year-old female referred by Dr. Fusco secondary to dysphagia. Upon review of chart, remote hx of Schatzki's ring in 2007, +H.pylori gastritis s/p treatment in 2011, hx of chronic constipation. Last EGD in Feb 2011 with small hiatal hernia, normal esophagus, +h.pylori gastritis. Last TCS in 2011 with normal rectum aside from anal canal hemorrhoids, left-sided diverticula. Prior to this appt, underwent UGI/BPE which showed mild diffuse age-related impairment of esophageal motility, tiny residual of contrast identified at cervical esophagus, which appears to be intraluminal and no definite Zenker's diverticulum identified. No obstruction of barium tablet.   Notes worsening of dysphagia symptoms over past month, present for "awhile". Difficulty with solids and liquids. Occasional pill dysphagia. No odynophagia. Points to cervical area. Globus sensation. Drinks to try and relieve sensation. +intermittent lack of appetite. No significant wt loss. Nausea intermittent, tries to avoid vomiting. Sometimes worse with eating, sometimes not. +reflux, takes Tums. Has taken Prilosec, Protonix. No significant improvement. +nocturnal reflux. Allergic to Nexium, the purple capsule. Finances issue for PPI compliance.   Stays constipated. Doesn't take anything. Has taken Miralax in the past, which helped. Cost is an issue with OTC meds. BM every 3-4 days. Tries to eat fiber-rich foods.   CT May 2013 with diffuse hepatic steatosis Most recent LFTs in May 2013 normal  Past Medical History  Diagnosis Date  . Asthma   . MS (multiple sclerosis)   . Migraine headache   . GERD (gastroesophageal reflux disease)   . Chronic neck pain     C4-6 fusion  . Sleep apnea   . Helicobacter pylori gastritis 2011  . Fatty liver      Past Surgical History  Procedure Date  . Neck surgery   . Appendectomy   . Cholecystectomy   . Abdominal hysterectomy   . Mandible surgery   . Colonoscopy 07/09/2009    RMR; normal rectum aside from anal canal hemorrhoids/scattered left-sided diverticula  . Esophagogastroduodenoscopy 05/22/2009    RMR; normal /small HH  . Esophagogastroduodenoscopy 2007    RMR: non-critical Schatzki's rin, non-maniuplated    Current Outpatient Prescriptions  Medication Sig Dispense Refill  . albuterol (PROVENTIL HFA;VENTOLIN HFA) 108 (90 BASE) MCG/ACT inhaler Inhale 2 puffs into the lungs every 6 (six) hours as needed. For Asthma      . amitriptyline (ELAVIL) 25 MG tablet Take 25 mg by mouth at bedtime.       . baclofen (LIORESAL) 10 MG tablet Take 10 mg by mouth 3 (three) times daily.       . citalopram (CELEXA) 40 MG tablet Take 40 mg by mouth at bedtime.       . diazepam (VALIUM) 2 MG tablet Take 2 mg by mouth daily as needed. For spasms      . gabapentin (NEURONTIN) 300 MG capsule Take 300 mg by mouth 3 (three) times daily.        . HYDROcodone-acetaminophen (LORTAB) 10-500 MG per tablet Take 1 tablet by mouth every 6 (six) hours as needed. Pain.      . ibuprofen (ADVIL,MOTRIN) 200 MG tablet Take 600 mg by mouth every other day. Patient takes before she gives herself her injection.       . interferon beta-1a (REBIF) 44 MCG/0.5ML injection Inject 44 mcg into the skin 3 (three) times a week. Take on   Tuesdays, Thursdays and Sundays      . oxybutynin (DITROPAN) 5 MG tablet Take 5 mg by mouth 2 (two) times daily.        . dexlansoprazole (DEXILANT) 60 MG capsule Take 1 capsule (60 mg total) by mouth daily.  90 capsule  3  . lubiprostone (AMITIZA) 24 MCG capsule Take 1 capsule (24 mcg total) by mouth 2 (two) times daily with a meal.  60 capsule  3  . PRESCRIPTION MEDICATION Take 1 tablet by mouth daily as needed. Headaches.      . traZODone (DESYREL) 100 MG tablet Take 200 mg by mouth at bedtime.         Allergies as of 02/23/2012 - Review Complete 02/23/2012  Allergen Reaction Noted  . Peanut-containing drug products Anaphylaxis   . Latex Other (See Comments) 01/15/2011  . Prednisone Hives and Other (See Comments)   . Nexium (esomeprazole magnesium) Hives 08/15/2011    Family History  Problem Relation Age of Onset  . Colon cancer Neg Hx   . Cancer Mother     unknown type  . Diabetes Father   . Diabetes Sister     History   Social History  . Marital Status: Married    Spouse Name: N/A    Number of Children: N/A  . Years of Education: N/A   Occupational History  . Not on file.   Social History Main Topics  . Smoking status: Never Smoker   . Smokeless tobacco: Not on file  . Alcohol Use: Yes     Comment: occasionally  . Drug Use: No  . Sexually Active: Yes    Birth Control/ Protection: Surgical   Other Topics Concern  . Not on file   Social History Narrative   5 kids, raising grandchild    Review of Systems: Gen: Denies any fever, chills, loss of appetite, fatigue, weight loss. CV: Denies chest pain, heart palpitations, syncope, peripheral edema. Resp: Denies shortness of breath with rest, cough, wheezing GI: SEE HPI GU : Denies urinary burning, urinary frequency, urinary incontinence.  MS: +MS Derm: Denies rash, itching, dry skin Psych: +memory loss Heme: Denies bruising, bleeding, and enlarged lymph nodes.  Physical Exam: BP 141/81  Pulse 90  Temp 97.6 F (36.4 C) (Temporal)  Ht 5' 5" (1.651 m)  Wt 221 lb 12.8 oz (100.608 kg)  BMI 36.91 kg/m2 General:   Alert and oriented. Well-developed, well-nourished, pleasant and cooperative. Head:  Normocephalic and atraumatic. Eyes:  Conjunctiva pink, sclera clear, no icterus.   Conjunctiva pink. Ears:  Normal auditory acuity. Nose:  No deformity, discharge,  or lesions. Mouth:  No deformity or lesions, mucosa pink and moist.  Neck:  Supple, without mass or thyromegaly. Lungs:  Clear to auscultation  bilaterally, without wheezing, rales, or rhonchi.  Heart:  S1, S2 present without murmurs noted.  Abdomen:  +BS, soft, non-tender and non-distended. Without mass or HSM. No rebound or guarding. No hernias noted. Rectal:  Deferred  Msk:  Symmetrical without gross deformities. Normal posture. Extremities:  Without clubbing or edema. Neurologic:  Alert and  oriented x4;  grossly normal neurologically. Skin:  Intact, warm and dry without significant lesions or rashes Cervical Nodes:  No significant cervical adenopathy. Psych:  Alert and cooperative. Normal mood and affect.   

## 2012-03-07 HISTORY — PX: BACK SURGERY: SHX140

## 2012-03-10 ENCOUNTER — Telehealth: Payer: Self-pay | Admitting: Internal Medicine

## 2012-03-10 ENCOUNTER — Encounter (HOSPITAL_COMMUNITY): Payer: Self-pay | Admitting: Pharmacy Technician

## 2012-03-10 ENCOUNTER — Other Ambulatory Visit: Payer: Self-pay | Admitting: Internal Medicine

## 2012-03-10 DIAGNOSIS — R1310 Dysphagia, unspecified: Secondary | ICD-10-CM

## 2012-03-10 NOTE — Telephone Encounter (Signed)
Pt called this morning. She said that she had seen AS recently and told her to call the office to give an update if the meds are working for her or not. Patient said that she is still choking when she eats and said that AS would recommend her having an EGD. Please advise if I need to bring patient back in office for OV or if she can be set up for procedure without one.

## 2012-03-10 NOTE — Telephone Encounter (Signed)
Yes. Proceed with EGD/ED with RMR with Phenergan 25 mg.

## 2012-03-10 NOTE — Telephone Encounter (Signed)
Patient is scheduled with RMR on 12/19 and I have mailed her instructions and she is aware

## 2012-03-17 ENCOUNTER — Encounter (HOSPITAL_COMMUNITY)
Admission: RE | Disposition: A | Discharge status: Home / Self Care | Payer: Self-pay | Source: Ambulatory Visit | Attending: Internal Medicine

## 2012-03-17 ENCOUNTER — Ambulatory Visit (HOSPITAL_COMMUNITY)
Admission: RE | Admit: 2012-03-17 | Discharge: 2012-03-17 | Disposition: A | Discharge status: Home / Self Care | Payer: BC Managed Care – PPO | Source: Ambulatory Visit | Attending: Internal Medicine | Admitting: Internal Medicine

## 2012-03-17 ENCOUNTER — Encounter (HOSPITAL_COMMUNITY): Payer: Self-pay | Admitting: *Deleted

## 2012-03-17 DIAGNOSIS — R1310 Dysphagia, unspecified: Secondary | ICD-10-CM

## 2012-03-17 DIAGNOSIS — K2940 Chronic atrophic gastritis without bleeding: Secondary | ICD-10-CM

## 2012-03-17 DIAGNOSIS — A048 Other specified bacterial intestinal infections: Secondary | ICD-10-CM | POA: Insufficient documentation

## 2012-03-17 DIAGNOSIS — J45909 Unspecified asthma, uncomplicated: Secondary | ICD-10-CM | POA: Insufficient documentation

## 2012-03-17 HISTORY — PX: ESOPHAGOGASTRODUODENOSCOPY (EGD) WITH ESOPHAGEAL DILATION: SHX5812

## 2012-03-17 SURGERY — ESOPHAGOGASTRODUODENOSCOPY (EGD) WITH ESOPHAGEAL DILATION
Anesthesia: Moderate Sedation

## 2012-03-17 MED ORDER — MIDAZOLAM HCL 5 MG/5ML IJ SOLN
INTRAMUSCULAR | Status: AC
  Filled 2012-03-17: qty 10

## 2012-03-17 MED ORDER — PROMETHAZINE HCL 25 MG/ML IJ SOLN
INTRAMUSCULAR | Status: AC
  Filled 2012-03-17: qty 1

## 2012-03-17 MED ORDER — STERILE WATER FOR IRRIGATION IR SOLN
Status: DC | PRN
  Administered 2012-03-17: 10:00:00

## 2012-03-17 MED ORDER — MEPERIDINE HCL 100 MG/ML IJ SOLN
INTRAMUSCULAR | Status: DC | PRN
  Administered 2012-03-17: 50 mg via INTRAVENOUS

## 2012-03-17 MED ORDER — PROMETHAZINE HCL 25 MG/ML IJ SOLN
25.0000 mg | Freq: Once | INTRAMUSCULAR | Status: AC
  Administered 2012-03-17: 25 mg via INTRAVENOUS

## 2012-03-17 MED ORDER — BUTAMBEN-TETRACAINE-BENZOCAINE 2-2-14 % EX AERO
INHALATION_SPRAY | CUTANEOUS | Status: DC | PRN
  Administered 2012-03-17: 2 via TOPICAL

## 2012-03-17 MED ORDER — MIDAZOLAM HCL 5 MG/5ML IJ SOLN
INTRAMUSCULAR | Status: DC | PRN
  Administered 2012-03-17: 1 mg via INTRAVENOUS
  Administered 2012-03-17: 2 mg via INTRAVENOUS

## 2012-03-17 MED ORDER — SODIUM CHLORIDE 0.45 % IV SOLN
INTRAVENOUS | Status: DC
  Administered 2012-03-17: 10:00:00 via INTRAVENOUS

## 2012-03-17 MED ORDER — SODIUM CHLORIDE 0.9 % IJ SOLN
INTRAMUSCULAR | Status: AC
  Filled 2012-03-17: qty 10

## 2012-03-17 MED ORDER — MEPERIDINE HCL 100 MG/ML IJ SOLN
INTRAMUSCULAR | Status: AC
  Filled 2012-03-17: qty 1

## 2012-03-17 NOTE — Op Note (Signed)
[facility] [address] [place], [address]   ENDOSCOPY PROCEDURE REPORT  PATIENT: [name], [name]  MR#: [id number] BIRTHDATE: Sep 16, 1965 , 46  yrs. old GENDER: Female ENDOSCOPIST: R.  Roetta Sessions, MD FACP FACG REFERRED BY:  Artis Delay, M.D. PROCEDURE DATE:  03/17/2012 PROCEDURE:     EGD with [name] dilation followed by gastric and esophageal biopsy  INDICATIONS:     Esophageal dysphagia.  INFORMED CONSENT:   The risks, benefits, limitations, alternatives and imponderables have been discussed.  The potential for biopsy, esophogeal dilation, etc. have also been reviewed.  Questions have been answered.  All parties agreeable.  Please see the history and physical in the medical record for more information.  MEDICATIONS:     Versed 3 mg IV and Demerol 50 mg IV in divided doses. Phenergan 25 mg IV Cetacaine spray.  DESCRIPTION OF PROCEDURE:   The EG-[id number] ([id number])  endoscope was introduced through the mouth and advanced to the second portion of the duodenum without difficulty or limitations.  The mucosal surfaces were surveyed very carefully during advancement of the scope and upon withdrawal.  Retroflexion view of the proximal stomach and esophagogastric junction was performed.      FINDINGS:  Some furrowing and  Subtle Ringed Appearance of the esophageal mucosa - more apparent after esophageal dilation. tubular esophagus widely patent throughout its course. Stomach empty. Some "snake skinning" of the gastric mucosa. No ulcer or infiltrating process. Patent pylorus. Normal first and second portion of the duodenum  THERAPEUTIC / DIAGNOSTIC MANEUVERS PERFORMED:  A 54 French Maloney dilator was passed to full insertion easily. Subsequently, a 45 French Maloney dilator was passed to full insertion easily. A look back revealed no bleeding or other apparent trauma/ complication related to passage of the dilators. Subsequently, biopsies of the mid and distal  esophagus taken to further evaluate for possible eosinophilic esophagitis. Finally, biopsies the gastric mucosa were taken   COMPLICATIONS:  None  IMPRESSION:  Abnormal appearing esophageal mucosa of uncertain significance. Status post [name] dilation followed by esophageal biopsy. Abnormal gastric mucosa of uncertain significance-status post biopsy  RECOMMENDATIONS:  Continue Dexilant 60 mg daily. Followup on pathology. Office visit to be scheduled.    _______________________________ R. Roetta Sessions, MD FACP [facility] eSigned:  R. Roetta Sessions, MD FACP [facility] 03/17/2012 10:35 AM     CC:  PATIENT NAME:  Makalya, Nave MR#: [id number]

## 2012-03-17 NOTE — H&P (View-Only) (Signed)
Referring Provider: Elfredia Nevins, MD Primary Care Physician:  Cassell Smiles., MD Primary Gastroenterologist:  Dr. Jena Gauss   Chief Complaint  Patient presents with  . Dysphagia    HPI:   46 year old female referred by Dr. Sherwood Gambler secondary to dysphagia. Upon review of chart, remote hx of Schatzki's ring in 2007, +H.pylori gastritis s/p treatment in 2011, hx of chronic constipation. Last EGD in Feb 2011 with small hiatal hernia, normal esophagus, +h.pylori gastritis. Last TCS in 2011 with normal rectum aside from anal canal hemorrhoids, left-sided diverticula. Prior to this appt, underwent UGI/BPE which showed mild diffuse age-related impairment of esophageal motility, tiny residual of contrast identified at cervical esophagus, which appears to be intraluminal and no definite Zenker's diverticulum identified. No obstruction of barium tablet.   Notes worsening of dysphagia symptoms over past month, present for "awhile". Difficulty with solids and liquids. Occasional pill dysphagia. No odynophagia. Points to cervical area. Globus sensation. Drinks to try and relieve sensation. +intermittent lack of appetite. No significant wt loss. Nausea intermittent, tries to avoid vomiting. Sometimes worse with eating, sometimes not. +reflux, takes Tums. Has taken Prilosec, Protonix. No significant improvement. +nocturnal reflux. Allergic to Nexium, the purple capsule. Finances issue for PPI compliance.   Stays constipated. Doesn't take anything. Has taken Miralax in the past, which helped. Cost is an issue with OTC meds. BM every 3-4 days. Tries to eat fiber-rich foods.   CT May 2013 with diffuse hepatic steatosis Most recent LFTs in May 2013 normal  Past Medical History  Diagnosis Date  . Asthma   . MS (multiple sclerosis)   . Migraine headache   . GERD (gastroesophageal reflux disease)   . Chronic neck pain     C4-6 fusion  . Sleep apnea   . Helicobacter pylori gastritis 2011  . Fatty liver      Past Surgical History  Procedure Date  . Neck surgery   . Appendectomy   . Cholecystectomy   . Abdominal hysterectomy   . Mandible surgery   . Colonoscopy 07/09/2009    RMR; normal rectum aside from anal canal hemorrhoids/scattered left-sided diverticula  . Esophagogastroduodenoscopy 05/22/2009    RMR; normal /small HH  . Esophagogastroduodenoscopy 2007    RMR: non-critical Schatzki's rin, non-maniuplated    Current Outpatient Prescriptions  Medication Sig Dispense Refill  . albuterol (PROVENTIL HFA;VENTOLIN HFA) 108 (90 BASE) MCG/ACT inhaler Inhale 2 puffs into the lungs every 6 (six) hours as needed. For Asthma      . amitriptyline (ELAVIL) 25 MG tablet Take 25 mg by mouth at bedtime.       . baclofen (LIORESAL) 10 MG tablet Take 10 mg by mouth 3 (three) times daily.       . citalopram (CELEXA) 40 MG tablet Take 40 mg by mouth at bedtime.       . diazepam (VALIUM) 2 MG tablet Take 2 mg by mouth daily as needed. For spasms      . gabapentin (NEURONTIN) 300 MG capsule Take 300 mg by mouth 3 (three) times daily.        Marland Kitchen HYDROcodone-acetaminophen (LORTAB) 10-500 MG per tablet Take 1 tablet by mouth every 6 (six) hours as needed. Pain.      Marland Kitchen ibuprofen (ADVIL,MOTRIN) 200 MG tablet Take 600 mg by mouth every other day. Patient takes before she gives herself her injection.       . interferon beta-1a (REBIF) 44 MCG/0.5ML injection Inject 44 mcg into the skin 3 (three) times a week. Take on  Tuesdays, Thursdays and Sundays      . oxybutynin (DITROPAN) 5 MG tablet Take 5 mg by mouth 2 (two) times daily.        Marland Kitchen dexlansoprazole (DEXILANT) 60 MG capsule Take 1 capsule (60 mg total) by mouth daily.  90 capsule  3  . lubiprostone (AMITIZA) 24 MCG capsule Take 1 capsule (24 mcg total) by mouth 2 (two) times daily with a meal.  60 capsule  3  . PRESCRIPTION MEDICATION Take 1 tablet by mouth daily as needed. Headaches.      . traZODone (DESYREL) 100 MG tablet Take 200 mg by mouth at bedtime.         Allergies as of 02/23/2012 - Review Complete 02/23/2012  Allergen Reaction Noted  . Peanut-containing drug products Anaphylaxis   . Latex Other (See Comments) 01/15/2011  . Prednisone Hives and Other (See Comments)   . Nexium (esomeprazole magnesium) Hives 08/15/2011    Family History  Problem Relation Age of Onset  . Colon cancer Neg Hx   . Cancer Mother     unknown type  . Diabetes Father   . Diabetes Sister     History   Social History  . Marital Status: Married    Spouse Name: N/A    Number of Children: N/A  . Years of Education: N/A   Occupational History  . Not on file.   Social History Main Topics  . Smoking status: Never Smoker   . Smokeless tobacco: Not on file  . Alcohol Use: Yes     Comment: occasionally  . Drug Use: No  . Sexually Active: Yes    Birth Control/ Protection: Surgical   Other Topics Concern  . Not on file   Social History Narrative   5 kids, raising grandchild    Review of Systems: Gen: Denies any fever, chills, loss of appetite, fatigue, weight loss. CV: Denies chest pain, heart palpitations, syncope, peripheral edema. Resp: Denies shortness of breath with rest, cough, wheezing GI: SEE HPI GU : Denies urinary burning, urinary frequency, urinary incontinence.  MS: +MS Derm: Denies rash, itching, dry skin Psych: +memory loss Heme: Denies bruising, bleeding, and enlarged lymph nodes.  Physical Exam: BP 141/81  Pulse 90  Temp 97.6 F (36.4 C) (Temporal)  Ht 5\' 5"  (1.651 m)  Wt 221 lb 12.8 oz (100.608 kg)  BMI 36.91 kg/m2 General:   Alert and oriented. Well-developed, well-nourished, pleasant and cooperative. Head:  Normocephalic and atraumatic. Eyes:  Conjunctiva pink, sclera clear, no icterus.   Conjunctiva pink. Ears:  Normal auditory acuity. Nose:  No deformity, discharge,  or lesions. Mouth:  No deformity or lesions, mucosa pink and moist.  Neck:  Supple, without mass or thyromegaly. Lungs:  Clear to auscultation  bilaterally, without wheezing, rales, or rhonchi.  Heart:  S1, S2 present without murmurs noted.  Abdomen:  +BS, soft, non-tender and non-distended. Without mass or HSM. No rebound or guarding. No hernias noted. Rectal:  Deferred  Msk:  Symmetrical without gross deformities. Normal posture. Extremities:  Without clubbing or edema. Neurologic:  Alert and  oriented x4;  grossly normal neurologically. Skin:  Intact, warm and dry without significant lesions or rashes Cervical Nodes:  No significant cervical adenopathy. Psych:  Alert and cooperative. Normal mood and affect.

## 2012-03-17 NOTE — Interval H&P Note (Signed)
History and Physical Interval Note:  03/17/2012 10:04 AM  [name]  has presented today for surgery, with the diagnosis of Dysphagia  The various methods of treatment have been discussed with the patient and family. After consideration of risks, benefits and other options for treatment, the patient has consented to  Procedure(s) (LRB) with comments: ESOPHAGOGASTRODUODENOSCOPY (EGD) WITH ESOPHAGEAL DILATION (N/A) - 10:15 as a surgical intervention .  The patient's history has been reviewed, patient examined, no change in status, stable for surgery.  I have reviewed the patient's chart and labs.  Questions were answered to the patient's satisfaction.     Eula Listen  As above. EGD with esophageal dilation as apprpriate. The risks, benefits, limitations, alternatives and imponderables have been reviewed with the patient. Potential for esophageal dilation, biopsy, etc. have also been reviewed.  Questions have been answered. All parties agreeable.`

## 2012-03-20 ENCOUNTER — Encounter: Payer: Self-pay | Admitting: Internal Medicine

## 2012-03-21 ENCOUNTER — Encounter (HOSPITAL_COMMUNITY): Payer: Self-pay | Admitting: Internal Medicine

## 2012-03-21 ENCOUNTER — Telehealth: Payer: Self-pay | Admitting: Gastroenterology

## 2012-03-21 NOTE — Telephone Encounter (Addendum)
+  h.pylori gastritis on path from EGD. Treated with Prevpac in remote past, then Pylera in 2011.  levofloxacin (250 mg), amoxicillin (1 g), and a PPI are recommended BID X 2 weeks per up-to-date for those who have failed treatment.   However, levofloxacin could potentially cause additive effects with Celexa, causing EKG changes (prolonged QT interval).  Is she still taking this? If not, let me know. If she is, we will do Pylera. She needs to strictly adhere to the regimen to ensure eradication.

## 2012-03-22 ENCOUNTER — Encounter: Payer: Self-pay | Admitting: *Deleted

## 2012-03-29 NOTE — Telephone Encounter (Signed)
Pt is still taking celexa. She stated she has been treated for hpylori 5-6 times. She also said that she would need something affordable because she doesn't have a lot of money. Pt is aware that it may be Thursday before I am able to get back with her because our office is closed tomorrow.   Tobi Bastos, which med do you want to try?

## 2012-03-31 ENCOUNTER — Other Ambulatory Visit: Payer: Self-pay | Admitting: Gastroenterology

## 2012-03-31 DIAGNOSIS — A048 Other specified bacterial intestinal infections: Secondary | ICD-10-CM

## 2012-03-31 MED ORDER — BIS SUBCIT-METRONID-TETRACYC 140-125-125 MG PO CAPS: 3.0000 | ORAL_CAPSULE | Freq: Three times a day (TID) | ORAL | Status: DC

## 2012-03-31 NOTE — Telephone Encounter (Signed)
MAKE SURE she knows to use birth control while on this!!!! AVOID PREGNANCY

## 2012-03-31 NOTE — Telephone Encounter (Signed)
Pt is aware, she has had a hysterectomy. Lab order on file.

## 2012-03-31 NOTE — Telephone Encounter (Signed)
Looks like our options are limited.  Let's do Pylera. I will send rx to pharmacy. May need the samples.   Need H.pylori stool antigen in 3 months.

## 2012-04-04 ENCOUNTER — Telehealth: Payer: Self-pay | Admitting: *Deleted

## 2012-04-04 NOTE — Telephone Encounter (Signed)
Faxed savings coupon to [employer] for pylera. Pt is aware and will call back if pylera is still too expensive.

## 2012-04-04 NOTE — Telephone Encounter (Signed)
[facility] called back, her insurance did cover it but her co-pay was $100.00

## 2012-04-04 NOTE — Telephone Encounter (Signed)
Left message with [facility] to find out if this was a prior authorization issue. Asked them to call or send [place] the information.

## 2012-04-04 NOTE — Telephone Encounter (Signed)
[name] called today. She cannot afford her medication that was prescribed to her, the pylera. Please call her back. Thanks.

## 2012-04-12 ENCOUNTER — Telehealth: Payer: Self-pay

## 2012-04-12 NOTE — Telephone Encounter (Signed)
Andrew from pylera came by office and dropped off samples of pylera. Called pt- LMOM.  pylera samples (dosing instructions are on the box) and prilosec samples and instructions on how to take prilosec and to stop dexilant while taking prilosec at the front desk.

## 2012-04-13 NOTE — Telephone Encounter (Signed)
Pt came by office and picked up samples. 

## 2012-04-13 NOTE — Telephone Encounter (Signed)
Tried to call pt- LMOM 

## 2012-04-16 ENCOUNTER — Emergency Department (HOSPITAL_COMMUNITY): Payer: BC Managed Care – PPO

## 2012-04-16 ENCOUNTER — Observation Stay (HOSPITAL_COMMUNITY): Payer: BC Managed Care – PPO

## 2012-04-16 ENCOUNTER — Observation Stay (HOSPITAL_COMMUNITY)
Admission: EM | Admit: 2012-04-16 | Discharge: 2012-04-17 | Disposition: A | Discharge status: Home / Self Care | Payer: BC Managed Care – PPO | Attending: Internal Medicine | Admitting: Internal Medicine

## 2012-04-16 ENCOUNTER — Encounter (HOSPITAL_COMMUNITY): Payer: Self-pay | Admitting: *Deleted

## 2012-04-16 DIAGNOSIS — R9431 Abnormal electrocardiogram [ECG] [EKG]: Secondary | ICD-10-CM

## 2012-04-16 DIAGNOSIS — M47814 Spondylosis without myelopathy or radiculopathy, thoracic region: Secondary | ICD-10-CM | POA: Diagnosis present

## 2012-04-16 DIAGNOSIS — I10 Essential (primary) hypertension: Secondary | ICD-10-CM | POA: Insufficient documentation

## 2012-04-16 DIAGNOSIS — G35 Multiple sclerosis: Secondary | ICD-10-CM

## 2012-04-16 DIAGNOSIS — K2900 Acute gastritis without bleeding: Secondary | ICD-10-CM | POA: Insufficient documentation

## 2012-04-16 DIAGNOSIS — R0789 Other chest pain: Principal | ICD-10-CM | POA: Insufficient documentation

## 2012-04-16 DIAGNOSIS — K219 Gastro-esophageal reflux disease without esophagitis: Secondary | ICD-10-CM | POA: Insufficient documentation

## 2012-04-16 DIAGNOSIS — A048 Other specified bacterial intestinal infections: Secondary | ICD-10-CM | POA: Insufficient documentation

## 2012-04-16 DIAGNOSIS — K7689 Other specified diseases of liver: Secondary | ICD-10-CM | POA: Insufficient documentation

## 2012-04-16 DIAGNOSIS — Z23 Encounter for immunization: Secondary | ICD-10-CM | POA: Insufficient documentation

## 2012-04-16 DIAGNOSIS — Z8659 Personal history of other mental and behavioral disorders: Secondary | ICD-10-CM

## 2012-04-16 DIAGNOSIS — E782 Mixed hyperlipidemia: Secondary | ICD-10-CM | POA: Diagnosis present

## 2012-04-16 DIAGNOSIS — R110 Nausea: Secondary | ICD-10-CM | POA: Diagnosis present

## 2012-04-16 DIAGNOSIS — R079 Chest pain, unspecified: Secondary | ICD-10-CM | POA: Diagnosis present

## 2012-04-16 DIAGNOSIS — R0602 Shortness of breath: Secondary | ICD-10-CM | POA: Insufficient documentation

## 2012-04-16 DIAGNOSIS — J45909 Unspecified asthma, uncomplicated: Secondary | ICD-10-CM | POA: Insufficient documentation

## 2012-04-16 DIAGNOSIS — R197 Diarrhea, unspecified: Secondary | ICD-10-CM | POA: Insufficient documentation

## 2012-04-16 DIAGNOSIS — E785 Hyperlipidemia, unspecified: Secondary | ICD-10-CM | POA: Insufficient documentation

## 2012-04-16 HISTORY — DX: Chest pain, unspecified: R07.9

## 2012-04-16 HISTORY — DX: Dysphagia, unspecified: R13.10

## 2012-04-16 HISTORY — DX: Multiple sclerosis: G35

## 2012-04-16 HISTORY — DX: Spondylosis without myelopathy or radiculopathy, thoracic region: M47.814

## 2012-04-16 HISTORY — DX: Migraine without aura, not intractable, without status migrainosus: G43.009

## 2012-04-16 HISTORY — DX: Personal history of other endocrine, nutritional and metabolic disease: Z86.39

## 2012-04-16 HISTORY — DX: Constipation, unspecified: K59.00

## 2012-04-16 HISTORY — DX: Personal history of diseases of the blood and blood-forming organs and certain disorders involving the immune mechanism: Z86.2

## 2012-04-16 HISTORY — DX: Abnormal electrocardiogram (ECG) (EKG): R94.31

## 2012-04-16 LAB — BASIC METABOLIC PANEL
CO2: 26 mEq/L (ref 19–32)
Calcium: 8.9 mg/dL (ref 8.4–10.5)
Creatinine, Ser: 0.71 mg/dL (ref 0.50–1.10)
Glucose, Bld: 102 mg/dL — ABNORMAL HIGH (ref 70–99)

## 2012-04-16 LAB — HEPATIC FUNCTION PANEL
AST: 36 U/L (ref 0–37)
Albumin: 3.3 g/dL — ABNORMAL LOW (ref 3.5–5.2)
Alkaline Phosphatase: 79 U/L (ref 39–117)
Total Bilirubin: 0.2 mg/dL — ABNORMAL LOW (ref 0.3–1.2)
Total Protein: 7.5 g/dL (ref 6.0–8.3)

## 2012-04-16 LAB — CBC
Hemoglobin: 12.7 g/dL (ref 12.0–15.0)
MCH: 30.5 pg (ref 26.0–34.0)
MCV: 88.2 fL (ref 78.0–100.0)
RBC: 4.17 MIL/uL (ref 3.87–5.11)

## 2012-04-16 LAB — LIPASE, BLOOD: Lipase: 7 U/L — ABNORMAL LOW (ref 11–59)

## 2012-04-16 LAB — D-DIMER, QUANTITATIVE: D-Dimer, Quant: 0.41 ug/mL-FEU (ref 0.00–0.48)

## 2012-04-16 MED ORDER — ALBUTEROL SULFATE HFA 108 (90 BASE) MCG/ACT IN AERS: 2.0000 | INHALATION_SPRAY | Freq: Four times a day (QID) | RESPIRATORY_TRACT | Status: DC | PRN

## 2012-04-16 MED ORDER — TRAZODONE HCL 50 MG PO TABS
200.0000 mg | ORAL_TABLET | Freq: Every day | ORAL | Status: DC
  Administered 2012-04-16: 200 mg via ORAL
  Filled 2012-04-16: qty 4
  Filled 2012-04-16 (×2): qty 2

## 2012-04-16 MED ORDER — ASPIRIN 81 MG PO CHEW
324.0000 mg | CHEWABLE_TABLET | Freq: Once | ORAL | Status: AC
  Administered 2012-04-16: 324 mg via ORAL
  Filled 2012-04-16: qty 4

## 2012-04-16 MED ORDER — CITALOPRAM HYDROBROMIDE 20 MG PO TABS
40.0000 mg | ORAL_TABLET | Freq: Every day | ORAL | Status: DC
  Administered 2012-04-16: 40 mg via ORAL
  Filled 2012-04-16 (×2): qty 1
  Filled 2012-04-16: qty 2

## 2012-04-16 MED ORDER — GABAPENTIN 300 MG PO CAPS
300.0000 mg | ORAL_CAPSULE | Freq: Three times a day (TID) | ORAL | Status: DC
  Administered 2012-04-16 – 2012-04-17 (×3): 300 mg via ORAL
  Filled 2012-04-16 (×5): qty 1
  Filled 2012-04-16: qty 3
  Filled 2012-04-16 (×2): qty 1

## 2012-04-16 MED ORDER — HYDROCODONE-ACETAMINOPHEN 5-325 MG PO TABS
1.0000 | ORAL_TABLET | ORAL | Status: DC | PRN
  Administered 2012-04-17: 1 via ORAL
  Filled 2012-04-16: qty 1

## 2012-04-16 MED ORDER — AMITRIPTYLINE HCL 25 MG PO TABS
25.0000 mg | ORAL_TABLET | Freq: Every day | ORAL | Status: DC
  Administered 2012-04-16: 25 mg via ORAL
  Filled 2012-04-16 (×3): qty 1

## 2012-04-16 MED ORDER — ONDANSETRON HCL 4 MG PO TABS: 4.0000 mg | ORAL_TABLET | Freq: Four times a day (QID) | ORAL | Status: DC | PRN

## 2012-04-16 MED ORDER — IPRATROPIUM BROMIDE 0.02 % IN SOLN: 0.5000 mg | Freq: Four times a day (QID) | RESPIRATORY_TRACT | Status: DC | PRN

## 2012-04-16 MED ORDER — BACLOFEN 10 MG PO TABS
10.0000 mg | ORAL_TABLET | Freq: Three times a day (TID) | ORAL | Status: DC | PRN
  Filled 2012-04-16: qty 1

## 2012-04-16 MED ORDER — NITROGLYCERIN 0.4 MG SL SUBL
0.4000 mg | SUBLINGUAL_TABLET | Freq: Once | SUBLINGUAL | Status: AC
  Administered 2012-04-16: 0.4 mg via SUBLINGUAL
  Filled 2012-04-16: qty 25

## 2012-04-16 MED ORDER — ASPIRIN EC 81 MG PO TBEC
81.0000 mg | DELAYED_RELEASE_TABLET | Freq: Every day | ORAL | Status: DC
  Administered 2012-04-17: 81 mg via ORAL
  Filled 2012-04-16 (×3): qty 1

## 2012-04-16 MED ORDER — SODIUM CHLORIDE 0.9 % IJ SOLN
3.0000 mL | Freq: Two times a day (BID) | INTRAMUSCULAR | Status: DC
  Administered 2012-04-16 – 2012-04-17 (×2): 3 mL via INTRAVENOUS

## 2012-04-16 MED ORDER — ONDANSETRON HCL 4 MG/2ML IJ SOLN
4.0000 mg | Freq: Once | INTRAMUSCULAR | Status: AC
  Administered 2012-04-16: 4 mg via INTRAVENOUS
  Filled 2012-04-16: qty 2

## 2012-04-16 MED ORDER — MORPHINE SULFATE 4 MG/ML IJ SOLN
4.0000 mg | Freq: Once | INTRAMUSCULAR | Status: AC
  Administered 2012-04-16: 4 mg via INTRAVENOUS
  Filled 2012-04-16: qty 1

## 2012-04-16 MED ORDER — ALBUTEROL SULFATE (5 MG/ML) 0.5% IN NEBU: 2.5000 mg | INHALATION_SOLUTION | RESPIRATORY_TRACT | Status: DC | PRN

## 2012-04-16 MED ORDER — INFLUENZA VIRUS VACC SPLIT PF IM SUSP
0.5000 mL | INTRAMUSCULAR | Status: AC
  Administered 2012-04-17: 0.5 mL via INTRAMUSCULAR
  Filled 2012-04-16: qty 0.5

## 2012-04-16 MED ORDER — DIAZEPAM 2 MG PO TABS: 2.0000 mg | ORAL_TABLET | Freq: Four times a day (QID) | ORAL | Status: DC | PRN

## 2012-04-16 MED ORDER — GI COCKTAIL ~~LOC~~
30.0000 mL | Freq: Once | ORAL | Status: AC
  Administered 2012-04-16: 30 mL via ORAL
  Filled 2012-04-16: qty 30

## 2012-04-16 MED ORDER — KETOROLAC TROMETHAMINE 30 MG/ML IJ SOLN
30.0000 mg | Freq: Once | INTRAMUSCULAR | Status: AC
  Administered 2012-04-16: 30 mg via INTRAVENOUS
  Filled 2012-04-16: qty 1

## 2012-04-16 MED ORDER — PNEUMOCOCCAL VAC POLYVALENT 25 MCG/0.5ML IJ INJ
0.5000 mL | INJECTION | INTRAMUSCULAR | Status: AC
  Administered 2012-04-17: 0.5 mL via INTRAMUSCULAR
  Filled 2012-04-16: qty 0.5

## 2012-04-16 MED ORDER — ONDANSETRON HCL 4 MG/2ML IJ SOLN: 4.0000 mg | Freq: Four times a day (QID) | INTRAMUSCULAR | Status: DC | PRN

## 2012-04-16 MED ORDER — PANTOPRAZOLE SODIUM 40 MG IV SOLR
40.0000 mg | Freq: Two times a day (BID) | INTRAVENOUS | Status: DC
  Administered 2012-04-16 – 2012-04-17 (×2): 40 mg via INTRAVENOUS
  Filled 2012-04-16 (×2): qty 40

## 2012-04-16 MED ORDER — DILTIAZEM HCL 30 MG PO TABS
30.0000 mg | ORAL_TABLET | Freq: Four times a day (QID) | ORAL | Status: DC
  Administered 2012-04-16 – 2012-04-17 (×4): 30 mg via ORAL
  Filled 2012-04-16 (×4): qty 1

## 2012-04-16 MED ORDER — OXYBUTYNIN CHLORIDE 5 MG PO TABS
5.0000 mg | ORAL_TABLET | Freq: Two times a day (BID) | ORAL | Status: DC
  Administered 2012-04-16 – 2012-04-17 (×2): 5 mg via ORAL
  Filled 2012-04-16 (×6): qty 1

## 2012-04-16 NOTE — ED Notes (Signed)
Sub sternal cp that started today while sleeping with sob/dizziness/weakness/nausea/diarrhea.

## 2012-04-16 NOTE — ED Provider Notes (Signed)
History   This chart was scribed for Donnetta Hutching, MD by Leone Payor, ED Scribe. This patient was seen in room APA02/AP[facility]02 and the patient's care was started at 1643.   CSN: [id number]  Arrival date & time 04/16/12  1635   First MD Initiated Contact with Patient 04/16/12 1643      Chief Complaint  Patient presents with  . Chest Pain     The history is provided by the patient. No language interpreter was used.    [name] is a 47 y.o. female who presents to the Emergency Department complaining of a new, constant, unchanged, moderate sub sternal chest pain described as sharp starting about 10 hours ago while sleeping. Pt has associated SOB, chest pressure, nausea, diarrhea. She denies having similar symptoms in the past. Pt states her father had a CABG in his early 29s. She reports taking 1 aspirin daily.   Pt has h/o asthma, MS, GERD, sleep apnea, chronic neck pain.  Pt denies smoking and alcohol use.  Past Medical History  Diagnosis Date  . Asthma   . MS (multiple sclerosis)   . Migraine headache   . GERD (gastroesophageal reflux disease)   . Chronic neck pain     C4-6 fusion  . Sleep apnea   . Helicobacter pylori gastritis 2011  . Fatty liver     Past Surgical History  Procedure Date  . Neck surgery   . Appendectomy   . Cholecystectomy   . Abdominal hysterectomy   . Mandible surgery   . Colonoscopy 07/09/2009    RMR; normal rectum aside from anal canal hemorrhoids/scattered left-sided diverticula  . Esophagogastroduodenoscopy 05/22/2009    RMR; normal /small HH  . Esophagogastroduodenoscopy 2007    RMR: non-critical Schatzki's rin, non-maniuplated  . Back surgery 03/07/2012    Nerve Stimulator  . Esophagogastroduodenoscopy (egd) with esophageal dilation 03/17/2012    Procedure: ESOPHAGOGASTRODUODENOSCOPY (EGD) WITH ESOPHAGEAL DILATION;  Surgeon: Corbin Ade, MD;  Location: [facility];  Service: Endoscopy;  Laterality: N/A;  10:15    Family History  Problem  Relation Age of Onset  . Colon cancer Neg Hx   . Cancer Mother     unknown type  . Diabetes Father   . Diabetes Sister     History  Substance Use Topics  . Smoking status: Never Smoker   . Smokeless tobacco: Not on file  . Alcohol Use: No    No OB history provided.   Review of Systems  A complete 10 system review of systems was obtained and all systems are negative except as noted in the HPI and PMH.    Allergies  Peanut-containing drug products; Latex; Prednisone; and Nexium  Home Medications   Current Outpatient Rx  Name  Route  Sig  Dispense  Refill  . ALBUTEROL SULFATE HFA 108 (90 BASE) MCG/ACT IN AERS   Inhalation   Inhale 2 puffs into the lungs every 6 (six) hours as needed. For Asthma         . AMITRIPTYLINE HCL 25 MG PO TABS   Oral   Take 25 mg by mouth at bedtime.          Marland Kitchen BACLOFEN 10 MG PO TABS   Oral   Take 10 mg by mouth 3 (three) times daily.          Marland Kitchen BIS SUBCIT-METRONID-TETRACYC 140-125-125 MG PO CAPS   Oral   Take 3 capsules by mouth 4 (four) times daily -  before meals  and at bedtime. For 10 days.   120 capsule   0   . CITALOPRAM HYDROBROMIDE 40 MG PO TABS   Oral   Take 40 mg by mouth at bedtime.          . DEXLANSOPRAZOLE 60 MG PO CPDR   Oral   Take 1 capsule (60 mg total) by mouth daily.   90 capsule   3   . DIAZEPAM 2 MG PO TABS   Oral   Take 2 mg by mouth daily as needed. For spasms and she will take with her Baclofen         . GABAPENTIN 300 MG PO CAPS   Oral   Take 300 mg by mouth 3 (three) times daily.           Marland Kitchen HYDROCODONE-ACETAMINOPHEN 10-500 MG PO TABS   Oral   Take 1 tablet by mouth every 6 (six) hours as needed. MS pain         . IBUPROFEN 200 MG PO TABS   Oral   Take 600 mg by mouth every other day. Patient takes before she gives herself her injection.          . INTERFERON BETA-1A 44 MCG/0.5ML [place] SOLN   Subcutaneous   Inject 44 mcg into the skin 3 (three) times a week. Take on Tuesdays,  Thursdays and Sundays         . LUBIPROSTONE 24 MCG PO CAPS   Oral   Take 1 capsule (24 mcg total) by mouth 2 (two) times daily with a meal.   60 capsule   3   . OXYBUTYNIN CHLORIDE 5 MG PO TABS   Oral   Take 5 mg by mouth 2 (two) times daily.           . TRAZODONE HCL 100 MG PO TABS   Oral   Take 200 mg by mouth at bedtime.           There were no vitals taken for this visit.  Physical Exam  Nursing note and vitals reviewed. Constitutional: She is oriented to person, place, and time. She appears well-developed and well-nourished.  HENT:  Head: Normocephalic and atraumatic.  Eyes: Conjunctivae normal and EOM are normal. Pupils are equal, round, and reactive to light.  Neck: Normal range of motion. Neck supple.  Cardiovascular: Normal rate, regular rhythm and normal heart sounds.   Pulmonary/Chest: Effort normal and breath sounds normal.  Abdominal: Soft. Bowel sounds are normal.  Musculoskeletal: Normal range of motion.  Neurological: She is alert and oriented to person, place, and time.  Skin: Skin is warm and dry.  Psychiatric: She has a normal mood and affect.    ED Course  Procedures (including critical care time)  DIAGNOSTIC STUDIES: Oxygen Saturation is 98% on room air, normal by my interpretation.    COORDINATION OF CARE:  5:14 PM Discussed treatment plan which includes CXR, CBC panel, basic metabolic panel, troponin I, EKG with pt at bedside and pt agreed to plan.    Results for orders placed during the hospital encounter of 04/16/12  CBC      Component Value Range   WBC 5.2  4.0 - 10.5 K/uL   RBC 4.17  3.87 - 5.11 MIL/uL   Hemoglobin 12.7  12.0 - 15.0 g/dL   HCT 69.6  29.5 - 28.4 %   MCV 88.2  78.0 - 100.0 fL   MCH 30.5  26.0 - 34.0 pg   MCHC 34.5  30.0 - 36.0 g/dL   RDW 47.8  29.5 - 62.1 %   Platelets 244  150 - 400 K/uL  BASIC METABOLIC PANEL      Component Value Range   Sodium 138  135 - 145 mEq/L   Potassium 3.5  3.5 - 5.1 mEq/L    Chloride 103  96 - 112 mEq/L   CO2 26  19 - 32 mEq/L   Glucose, Bld 102 (*) 70 - 99 mg/dL   BUN 9  6 - 23 mg/dL   Creatinine, Ser 3.08  0.50 - 1.10 mg/dL   Calcium 8.9  8.4 - 65.7 mg/dL   GFR calc non Af Amer >90  >90 mL/min   GFR calc Af Amer >90  >90 mL/min  TROPONIN I      Component Value Range   Troponin I <0.30  <0.30 ng/mL   Dg Chest Port 1 View  04/16/2012  *RADIOLOGY REPORT*  Clinical Data: 47 year old female with chest pain.  PORTABLE CHEST - 1 VIEW  Comparison: 03/01/2010 and prior chest radiographs  Findings: Mild cardiomegaly and mild peribronchial thickening again noted. There is no evidence of focal airspace disease, pulmonary edema, suspicious pulmonary nodule/mass, pleural effusion, or pneumothorax. No acute bony abnormalities are identified. Lower cervical spine surgical hardware and a thoracic neurostimulator device noted.  IMPRESSION: Mild cardiomegaly without evidence of acute cardiopulmonary disease.   Original Report Authenticated By: Harmon Pier, M.D.       Labs Reviewed  CBC  BASIC METABOLIC PANEL  TROPONIN I   No results found. Results for orders placed during the hospital encounter of 04/16/12  CBC      Component Value Range   WBC 5.2  4.0 - 10.5 K/uL   RBC 4.17  3.87 - 5.11 MIL/uL   Hemoglobin 12.7  12.0 - 15.0 g/dL   HCT 84.6  96.2 - 95.2 %   MCV 88.2  78.0 - 100.0 fL   MCH 30.5  26.0 - 34.0 pg   MCHC 34.5  30.0 - 36.0 g/dL   RDW 84.1  32.4 - 40.1 %   Platelets 244  150 - 400 K/uL  BASIC METABOLIC PANEL      Component Value Range   Sodium 138  135 - 145 mEq/L   Potassium 3.5  3.5 - 5.1 mEq/L   Chloride 103  96 - 112 mEq/L   CO2 26  19 - 32 mEq/L   Glucose, Bld 102 (*) 70 - 99 mg/dL   BUN 9  6 - 23 mg/dL   Creatinine, Ser 0.27  0.50 - 1.10 mg/dL   Calcium 8.9  8.4 - 25.3 mg/dL   GFR calc non Af Amer >90  >90 mL/min   GFR calc Af Amer >90  >90 mL/min  TROPONIN I      Component Value Range   Troponin I <0.30  <0.30 ng/mL    Dg Chest Port 1  View  04/16/2012  *RADIOLOGY REPORT*  Clinical Data: 47 year old female with chest pain.  PORTABLE CHEST - 1 VIEW  Comparison: 03/01/2010 and prior chest radiographs  Findings: Mild cardiomegaly and mild peribronchial thickening again noted. There is no evidence of focal airspace disease, pulmonary edema, suspicious pulmonary nodule/mass, pleural effusion, or pneumothorax. No acute bony abnormalities are identified. Lower cervical spine surgical hardware and a thoracic neurostimulator device noted.  IMPRESSION: Mild cardiomegaly without evidence of acute cardiopulmonary disease.   Original Report Authenticated By: Harmon Pier, M.D.  No diagnosis found.   Date: 04/16/2012  Rate: 94  Rhythm: normal sinus rhythm  QRS Axis: left axis Intervals: normal  ST/T Wave abnormalities: normal  Conduction Disutrbances: none  Narrative Interpretation: unremarkable     MDM  Chest pain with dyspnea, diaphoresis, nausea.  Father had CABG in his early 87s.   Admit to OBS      I personally performed the services described in this documentation, which was scribed in my presence. The recorded information has been reviewed and is accurate.   Donnetta Hutching, MD 04/16/12 2030

## 2012-04-16 NOTE — H&P (Signed)
[facility] History and Physical  [name] ZOX:[id number] DOB: 1965-04-17 DOA: 04/16/2012  Referring physician: Particia Lather PCP: Cassell Smiles., MD  Specialists: none  Chief Complaint: Chest Pain  HPI: [name] is a 47 y.o. female with multiple sclerosis, degenerative disc disease, essential hypertension and GERD who presented to the emergency department complaining of acute onset chest pain. The pain started this morning at 7 AM and awoke her from sleep, she describes it as the worst pain she has "ever felt in her life". It was located in her central chest, described as a pressure but also sharp pain, associated with severe nausea. Shortly after the chest pain awoke her from sleep she had several episodes of watery diarrhea which resolved but the nausea persisted. The intensity of the chest pain decreased after a few minutes but has remained constant low level since 7 AM and has not resolved and is currently present. She denies any shortness of breath, diaphoresis, palpitations. The pain does not change based on her position, it does not radiate, it is not associated with any reflux or heartburn symptoms, she has not tried eating anything because of the nausea. In the emergency department her EKG did not show any signs of ischemia but she does have a left axis deviation, her troponin was negative, she has central hypertension for which she is not taking any medication, she is a smoker, she has a positive family history for coronary artery disease including a sister who has had a coronary artery bypass graft surgery and a father who has had multiple heart attacks.   Chart review indicates significant issues with dysphagia that were worked up by GI last month, she had an EGD dilation of her esophagus for achalasia and Schatzki ring, she has also been H. Pylori positive and had multiple treatment courses, she also has a diagnosis of IBS. No recent med changes. Chart review also showed a fairly  recent MRI with Thoracic DJD but she denis and twisting, or trauma to her chest and back.  Review of Systems: Review of Systems  Constitutional: Negative.   HENT: Negative.   Eyes: Positive for photophobia and pain.  Respiratory: Negative.   Cardiovascular: Positive for chest pain. Negative for palpitations, orthopnea, claudication, leg swelling and PND.  Gastrointestinal: Positive for nausea and diarrhea. Negative for heartburn, vomiting, abdominal pain, constipation, blood in stool and melena.  Genitourinary: Negative.   Musculoskeletal: Positive for myalgias and back pain.  Skin: Negative.   Neurological: Negative.   Endo/Heme/Allergies: Negative.   Psychiatric/Behavioral: Negative.      Past Medical History  Diagnosis Date  . Asthma   . MS (multiple sclerosis)   . Migraine headache   . GERD (gastroesophageal reflux disease)   . Chronic neck pain     C4-6 fusion  . Sleep apnea   . Helicobacter pylori gastritis 2011  . Fatty liver   . MIGRAINE, COMMON 05/14/2009    Qualifier: Diagnosis of  By: Ricard Dillon    . CONSTIPATION 05/16/2009    Qualifier: Diagnosis of  By: Yetta Barre FNP-BC, Kandice L   . LIVER FUNCTION TESTS, ABNORMAL, HX OF 05/14/2009    Qualifier: Diagnosis of  By: Ricard Dillon    . Dysphagia 02/23/2012   Past Surgical History  Procedure Date  . Neck surgery   . Appendectomy   . Cholecystectomy   . Abdominal hysterectomy   . Mandible surgery   . Colonoscopy 07/09/2009    RMR; normal rectum aside from anal canal  hemorrhoids/scattered left-sided diverticula  . Esophagogastroduodenoscopy 05/22/2009    RMR; normal /small HH  . Esophagogastroduodenoscopy 2007    RMR: non-critical Schatzki's rin, non-maniuplated  . Back surgery 03/07/2012    Nerve Stimulator  . Esophagogastroduodenoscopy (egd) with esophageal dilation 03/17/2012    Procedure: ESOPHAGOGASTRODUODENOSCOPY (EGD) WITH ESOPHAGEAL DILATION;  Surgeon: Corbin Ade, MD;  Location: [facility];   Service: Endoscopy;  Laterality: N/A;  10:15   Social History:  reports that she has never smoked. She does not have any smokeless tobacco history on file. She reports that she does not drink alcohol or use illicit drugs.   Allergies  Allergen Reactions  . Peanut-Containing Drug Products Anaphylaxis    Patient states she is allergic to all nuts  . Latex Other (See Comments)    Reaction: blistering  . Prednisone Hives and Other (See Comments)    Reaction: causes psychological issues   . Nexium (Esomeprazole Magnesium) Hives    Family History  Problem Relation Age of Onset  . Colon cancer Neg Hx   . Cancer Mother     unknown type  . Diabetes Father   . Diabetes Sister   Sister with Heart disease, s/p CABG, father with MI  Prior to Admission medications   Medication Sig Start Date End Date Taking? Authorizing Provider  albuterol (PROVENTIL HFA;VENTOLIN HFA) 108 (90 BASE) MCG/ACT inhaler Inhale 2 puffs into the lungs every 6 (six) hours as needed. For Asthma   Yes Historical Provider, MD  amitriptyline (ELAVIL) 25 MG tablet Take 25 mg by mouth at bedtime.  02/03/12  Yes Historical Provider, MD  baclofen (LIORESAL) 10 MG tablet Take 10 mg by mouth 3 (three) times daily.    Yes Historical Provider, MD  citalopram (CELEXA) 40 MG tablet Take 40 mg by mouth at bedtime.    Yes Historical Provider, MD  dexlansoprazole (DEXILANT) 60 MG capsule Take 1 capsule (60 mg total) by mouth daily. 02/23/12  Yes Nira Retort, NP  diazepam (VALIUM) 2 MG tablet Take 2 mg by mouth daily as needed. For spasms and she will take with her Baclofen   Yes Historical Provider, MD  gabapentin (NEURONTIN) 300 MG capsule Take 300 mg by mouth 3 (three) times daily.     Yes Historical Provider, MD  HYDROcodone-acetaminophen (LORTAB) 10-500 MG per tablet Take 1 tablet by mouth every 6 (six) hours as needed. MS pain   Yes Historical Provider, MD  lubiprostone (AMITIZA) 24 MCG capsule Take 1 capsule (24 mcg total) by mouth  2 (two) times daily with a meal. 02/23/12  Yes Nira Retort, NP  oxybutynin (DITROPAN) 5 MG tablet Take 5 mg by mouth 2 (two) times daily.     Yes Historical Provider, MD  traZODone (DESYREL) 100 MG tablet Take 200 mg by mouth at bedtime.   Yes Historical Provider, MD  ibuprofen (ADVIL,MOTRIN) 200 MG tablet Take 600 mg by mouth every other day. Patient takes before she gives herself her injection.     Historical Provider, MD  interferon beta-1a (REBIF) 44 MCG/0.5ML injection Inject 44 mcg into the skin 3 (three) times a week. Take on Tuesdays, Thursdays and Sundays    Historical Provider, MD   Physical Exam: Filed Vitals:   04/16/12 1643 04/16/12 1743 04/16/12 1753  BP: 145/85 121/82 121/82  Pulse:   87  Temp: 97.8 F (36.6 C)    TempSrc: Oral    Resp: 20 20   SpO2: 98% 99% 90%     General:  Alert, well appearing  Eyes: left eye red conjunctiva, EOMI  ENT: normal  Neck: normal  Cardiovascular: RRR, no mrg  Respiratory: CTAB  Abdomen: Soft, NT  Skin: no rashes  Musculoskeletal: some muscle spasm in her lower legs  Psychiatric: appropriate  Neurologic: non-focal exam  Labs on Admission:  Basic Metabolic Panel:  Lab 04/16/12 1191  NA 138  K 3.5  CL 103  CO2 26  GLUCOSE 102*  BUN 9  CREATININE 0.71  CALCIUM 8.9  MG --  PHOS --   Liver Function Tests: No results found for this basename: AST:5,ALT:5,ALKPHOS:5,BILITOT:5,PROT:5,ALBUMIN:5 in the last 168 hours No results found for this basename: LIPASE:5,AMYLASE:5 in the last 168 hours No results found for this basename: AMMONIA:5 in the last 168 hours CBC:  Lab 04/16/12 1704  WBC 5.2  NEUTROABS --  HGB 12.7  HCT 36.8  MCV 88.2  PLT 244   Cardiac Enzymes:  Lab 04/16/12 1704  CKTOTAL --  CKMB --  CKMBINDEX --  TROPONINI <0.30    BNP (last 3 results) No results found for this basename: PROBNP:3 in the last 8760 hours CBG: No results found for this basename: GLUCAP:5 in the last 168  hours  Radiological Exams on Admission: Dg Chest Port 1 View  04/16/2012  *RADIOLOGY REPORT*  Clinical Data: 47 year old female with chest pain.  PORTABLE CHEST - 1 VIEW  Comparison: 03/01/2010 and prior chest radiographs  Findings: Mild cardiomegaly and mild peribronchial thickening again noted. There is no evidence of focal airspace disease, pulmonary edema, suspicious pulmonary nodule/mass, pleural effusion, or pneumothorax. No acute bony abnormalities are identified. Lower cervical spine surgical hardware and a thoracic neurostimulator device noted.  IMPRESSION: Mild cardiomegaly without evidence of acute cardiopulmonary disease.   Original Report Authenticated By: Harmon Pier, M.D.     EKG: Independently reviewed. Left axis deviation.  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 Highly suggestive of esophageal spasm or msk related CP, but given history and risk factors agreed to admit for observation.   Plan: 1. R/O ACS chest pain since she has risk factors-primary reason for admission, cycle enzymes, monitor tele, 12 lead in AM, supp O2, ASA, consider BB, Lipid Panel 2. GI reason for Atypical Chest pain favored-most likely esophageal spasm-Gi cocktail, IV Protonix, will start Calcium channel blocker (Diltiazem PO she has HTN that is untreated too)-if pain persists may need inpatient GI consult she has an extensive history with Dr. Jena Gauss for esophageal dysmotility issues and H. Pylori-If not improved tomorrow can consider ordering barium swallow evaluation. 3. MSK chest pain, pain slightly reproducible, review of her records shows that she had a recent  T-Spine MRI that showed significant T6-T7 DJD so she is high risk for Thoracic disc hernation which could cause this kind of pain. Will check CT.  Disposition Plan: Home when medically stable, observation, complete ACS rule out   Time spent: 70 minutes  [facility] Pager [phone]  If 7PM-7AM, please contact night-coverage [email or web address] Password [facility] 04/16/2012, 9:50 PM

## 2012-04-17 ENCOUNTER — Observation Stay (HOSPITAL_COMMUNITY): Payer: BC Managed Care – PPO

## 2012-04-17 DIAGNOSIS — R079 Chest pain, unspecified: Secondary | ICD-10-CM

## 2012-04-17 DIAGNOSIS — G35 Multiple sclerosis: Secondary | ICD-10-CM

## 2012-04-17 DIAGNOSIS — I10 Essential (primary) hypertension: Secondary | ICD-10-CM

## 2012-04-17 DIAGNOSIS — R9431 Abnormal electrocardiogram [ECG] [EKG]: Secondary | ICD-10-CM

## 2012-04-17 LAB — TROPONIN I: Troponin I: <0.3 ng/mL (ref <0.30)

## 2012-04-17 LAB — LIPID PANEL
HDL: 45 mg/dL (ref >39)
LDL Cholesterol: 86 mg/dL (ref 0–99)
Triglycerides: 142 mg/dL (ref <150)

## 2012-04-17 LAB — GLUCOSE, CAPILLARY: Glucose-Capillary: 112 mg/dL — ABNORMAL HIGH (ref 70–99)

## 2012-04-17 MED ORDER — IOHEXOL 350 MG/ML SOLN
100.0000 mL | Freq: Once | INTRAVENOUS | Status: AC | PRN
  Administered 2012-04-17: 100 mL via INTRAVENOUS

## 2012-04-17 MED ORDER — ASPIRIN 81 MG PO TBEC: 81.0000 mg | DELAYED_RELEASE_TABLET | Freq: Every day | ORAL | Status: DC

## 2012-04-17 MED ORDER — DILTIAZEM HCL ER COATED BEADS 120 MG PO CP24: 120.0000 mg | ORAL_CAPSULE | Freq: Every day | ORAL | Status: DC

## 2012-04-17 MED ORDER — PANTOPRAZOLE SODIUM 40 MG PO TBEC: 40.0000 mg | DELAYED_RELEASE_TABLET | Freq: Two times a day (BID) | ORAL | Status: DC

## 2012-04-17 NOTE — Progress Notes (Signed)
The patient is receiving Protonix by the intravenous route.  Based on criteria approved by the Pharmacy and Therapeutics Committee and the Medical Executive Committee, the medication is being converted to the equivalent oral dose form.  These criteria include: -No Active GI bleeding -Able to tolerate diet of full liquids (or better) or tube feeding OR able to tolerate other medications by the oral or enteral route  If you have any questions about this conversion, please contact the Pharmacy Department (ext [phone]).  Thank you.  Mady Gemma, [facility] 04/17/2012 1:46 PM

## 2012-04-17 NOTE — Discharge Summary (Signed)
Physician Discharge Summary  [name] ZOX:[id number] DOB: 21-Apr-1965 DOA: 04/16/2012  PCP: Cassell Smiles., MD  Admit date: 04/16/2012 Discharge date: 04/17/2012  Time spent: 40 minutes  Recommendations for Outpatient Follow-up:  1. Follow up with primary care doctor in 2 weeks 2. Follow up with Dr. Jena Gauss as previously scheduled 3. 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 female with multiple sclerosis, degenerative disc disease, essential hypertension and GERD who presented to the emergency department complaining of acute onset chest pain. The pain started this morning at 7 AM and awoke her from sleep, she describes it as the worst pain she has "ever felt in her life". It was located in her central chest, described as a pressure but also sharp pain, associated with severe nausea. Shortly after the chest pain awoke her from sleep she had several episodes of watery diarrhea which resolved but the nausea persisted. The intensity of the chest pain decreased after a few minutes but has remained constant low level since 7 AM and has not resolved and is currently present. She denies any shortness of breath, diaphoresis, palpitations. The pain does not change based on her position, it does not radiate, it is not associated with any reflux or heartburn symptoms, she has not  tried eating anything because of the nausea. In the emergency department her EKG did not show any signs of ischemia but she does have a left axis deviation, her troponin was negative, she has central hypertension for which she is not taking any medication, she is a smoker, she has a positive family history for coronary artery disease including a sister who has had a coronary artery bypass graft surgery and a father who has had multiple heart attacks.  Chart review indicates significant issues with dysphagia that were worked up by GI last month, she had an EGD dilation of her esophagus for achalasia and Schatzki ring, she has also been H. Pylori positive and had multiple treatment courses, she also has a diagnosis of IBS. No recent med changes. Chart review also showed a fairly recent MRI with Thoracic DJD but she denis and twisting, or trauma to her chest and back.   Hospital Course:  This lady was admitted to the hospital with chest pain. She described this as waking her up from sleep, associated with nausea. She also describes having several episodes of watery diarrhea. Patient has a significant GI history and recently underwent EGD dilatation last month. This also has known H. pylori as well as IBS. She was monitored on telemetry. EKG did not show any acute changes. Cardiac enzymes are negative x3. CT angiogram of the chest was negative for pulmonary embolus or aortic dissection. On exam, pain was also reproducible on palpation. The patient reported improvement in her symptoms. She's not had any further diarrhea in the hospital. She is tolerating a solid  diet. She was started on calcium channel blocker for any element of esophageal spasm. She does have some improvement with this. Her symptoms may also be related to a gastroenteritis. She's been observed in the hospital. She'll be set up with cardiology to be evaluated as an outpatient for possible stress testing. She already has followup scheduled with her  gastroenterologist. She'll follow up with primary care physician in next 2 weeks.  Procedures:  none  Consultations:  none  Discharge Exam: Filed Vitals:   04/16/12 2200 04/16/12 2320 04/17/12 0606 04/17/12 1400  BP: 142/73 126/84 113/52 133/55  Pulse:  90 96 80  Temp:  98.3 F (36.8 C) 97.7 F (36.5 C) 97.4 F (36.3 C)  TempSrc:  Oral Oral Oral  Resp:  16 18 18   Height:  5\' 5"  (1.651 m)    Weight:  100.4 kg (221 lb 5.5 oz) 100 kg (220 lb 7.4 oz)   SpO2:  94% 93% 95%    General: NAD Cardiovascular: s1, s2, rrr Respiratory: CTA B  Discharge Instructions  Discharge Orders    Future Appointments: Provider: Department: Dept Phone: Center:   05/25/2012 10:30 AM Nira Retort, NP [facility] [phone] [facility]     Future Orders Please Complete By Expires   Diet - low sodium heart healthy      Increase activity slowly      Call MD for:  persistant nausea and vomiting      Call MD for:  severe uncontrolled pain      Call MD for:  temperature >100.4      Call MD for:  difficulty breathing, headache or visual disturbances          Medication List     As of 04/17/2012  6:11 PM    STOP taking these medications         bismuth-metronidazole-tetracycline 140-125-125 MG per capsule   Commonly known as: PLYERA      ibuprofen 200 MG tablet   Commonly known as: ADVIL,MOTRIN      TAKE these medications         albuterol 108 (90 BASE) MCG/ACT inhaler   Commonly known as: PROVENTIL HFA;VENTOLIN HFA   Inhale 2 puffs into the lungs every 6 (six) hours as needed. For Asthma      amitriptyline 25 MG tablet   Commonly known as: ELAVIL   Take 25 mg by mouth at bedtime.      aspirin 81 MG EC tablet   Take 1 tablet (81 mg total) by mouth daily.      baclofen 10 MG tablet   Commonly known as: LIORESAL   Take 10 mg by mouth 3 (three) times daily.      citalopram 40 MG tablet   Commonly known as: CELEXA   Take 40 mg by mouth at bedtime.       dexlansoprazole 60 MG capsule   Commonly known as: DEXILANT   Take 1 capsule (60 mg total) by mouth daily.      diazepam 2 MG tablet   Commonly known as: VALIUM   Take 2 mg by mouth daily as needed. For spasms and she will take with her Baclofen      diltiazem 120 MG 24 hr capsule   Commonly known as: CARDIZEM CD   Take 1 capsule (120 mg total) by mouth daily.      gabapentin 300 MG capsule   Commonly known as: NEURONTIN   Take 300 mg by mouth 3 (  three) times daily.      HYDROcodone-acetaminophen 10-500 MG per tablet   Commonly known as: LORTAB   Take 1 tablet by mouth every 6 (six) hours as needed. MS pain      lubiprostone 24 MCG capsule   Commonly known as: AMITIZA   Take 1 capsule (24 mcg total) by mouth 2 (two) times daily with a meal.      oxybutynin 5 MG tablet   Commonly known as: DITROPAN   Take 5 mg by mouth 2 (two) times daily.      REBIF 44 MCG/0.5ML injection   Generic drug: interferon beta-1a   Inject 44 mcg into the skin 3 (three) times a week. Take on Tuesdays, Thursdays and Sundays      traZODone 100 MG tablet   Commonly known as: DESYREL   Take 200 mg by mouth at bedtime.           Follow-up Information    Follow up with Cassell Smiles., MD. Schedule an appointment as soon as possible for a visit in 2 weeks.   Contact information:   [address] [place] [address] [phone]       Follow up with Eula Listen, MD. (as scheduled)    Contact information:   [address] [place] [address] [phone]       Follow up with [facility] cardiology will call you for an outpatient appointment.          The results of significant diagnostics from this hospitalization (including imaging, microbiology, ancillary and laboratory) are listed below for reference.    Significant Diagnostic Studies: Ct Angio Chest Pe W/cm &/or Wo Cm  04/17/2012  *RADIOLOGY REPORT*  Clinical Data: Atypical chest  pain.  CT ANGIOGRAPHY CHEST  Technique:  Multidetector CT imaging of the chest using the standard protocol during bolus administration of intravenous contrast. Multiplanar reconstructed images including MIPs were obtained and reviewed to evaluate the vascular anatomy.  Contrast: OMNIPAQUE IOHEXOL 350 MG/ML SOLN  Comparison: 03/01/2010  Findings: Technically adequate study with good opacification of the central and segmental pulmonary arteries.  No focal filling defects.  No evidence of significant pulmonary embolus.  Normal heart size.  Normal caliber thoracic aorta.  No abnormal lymphadenopathy in the chest.  Esophagus is decompressed.  The visualized portions of the upper abdomen demonstrates diffuse fatty infiltration of the liver, previous cholecystectomy and small accessory spleen.  No pleural effusions.  There is mild atelectasis in the lung bases, likely representing dependent atelectasis.  No focal or diffuse airspace disease.  No significant interstitial change.  The airways appear patent. Normal alignment of the thoracic vertebrae.  The thoracic spinal stimulator leads.  IMPRESSION: No evidence of significant pulmonary embolus.  Mild dependent atelectasis in the lungs.  No significant change since previous study.   Original Report Authenticated By: Burman Nieves, M.D.    Dg Chest Port 1 View  04/16/2012  *RADIOLOGY REPORT*  Clinical Data: 47 year old female with chest pain.  PORTABLE CHEST - 1 VIEW  Comparison: 03/01/2010 and prior chest radiographs  Findings: Mild cardiomegaly and mild peribronchial thickening again noted. There is no evidence of focal airspace disease, pulmonary edema, suspicious pulmonary nodule/mass, pleural effusion, or pneumothorax. No acute bony abnormalities are identified. Lower cervical spine surgical hardware and a thoracic neurostimulator device noted.  IMPRESSION: Mild cardiomegaly without evidence of acute cardiopulmonary disease.   Original Report Authenticated  By: Harmon Pier, M.D.  Microbiology: No results found for this or any previous visit (from the past 240 hour(s)).   Labs: Basic Metabolic Panel:  Lab 04/16/12 1478  NA 138  K 3.5  CL 103  CO2 26  GLUCOSE 102*  BUN 9  CREATININE 0.71  CALCIUM 8.9  MG --  PHOS --   Liver Function Tests:  Lab 04/16/12 1704  AST 36  ALT 43*  ALKPHOS 79  BILITOT 0.2*  PROT 7.5  ALBUMIN 3.3*    Lab 04/16/12 1704  LIPASE 7*  AMYLASE --   No results found for this basename: AMMONIA:5 in the last 168 hours CBC:  Lab 04/16/12 1704  WBC 5.2  NEUTROABS --  HGB 12.7  HCT 36.8  MCV 88.2  PLT 244   Cardiac Enzymes:  Lab 04/17/12 1358 04/17/12 0805 04/16/12 1704  CKTOTAL -- -- --  CKMB -- -- --  CKMBINDEX -- -- --  TROPONINI <0.30 <0.30 <0.30   BNP: BNP (last 3 results) No results found for this basename: PROBNP:3 in the last 8760 hours CBG:  Lab 04/17/12 0715  GLUCAP 112*       Signed:  MEMON,JEHANZEB  [facility] 04/17/2012, 6:11 PM

## 2012-04-17 NOTE — Progress Notes (Signed)
States understanding of discharge instructions, no complaint of pain.

## 2012-04-21 ENCOUNTER — Ambulatory Visit (INDEPENDENT_AMBULATORY_CARE_PROVIDER_SITE_OTHER): Payer: BC Managed Care – PPO | Admitting: Cardiovascular Disease

## 2012-04-21 ENCOUNTER — Encounter: Payer: Self-pay | Admitting: *Deleted

## 2012-04-21 VITALS — BP 147/89 | HR 95 | Ht 65.0 in | Wt 225.0 lb

## 2012-04-21 DIAGNOSIS — E785 Hyperlipidemia, unspecified: Secondary | ICD-10-CM

## 2012-04-21 DIAGNOSIS — G35 Multiple sclerosis: Secondary | ICD-10-CM

## 2012-04-21 DIAGNOSIS — I10 Essential (primary) hypertension: Secondary | ICD-10-CM

## 2012-04-21 DIAGNOSIS — R079 Chest pain, unspecified: Secondary | ICD-10-CM

## 2012-04-21 NOTE — Progress Notes (Signed)
Patient ID: [name], female   DOB: Feb 07, 1966, 47 y.o.   MRN: [id number] 47 yo recent hospitalization for atypical chest pain.  Reviewed records.  R/O normal ECG  CT negative for PE.  Has history of esophageal problems with GERD and previous dilatation.  No GI overtones to pain.  Pain is sharp left sided. Still persist post hospital discharge not necessarilhy exertional.  Has MS on Rx 3 years but this primarily affects vision in left eye.  Raising her 59 yo grandson Does not work and fairly sedentary No recent trauma or system arthritis.  Mild exertional dyspnea.  Has a history of relative tachycardia but no documented arrhythmia.    ROS: Denies fever, malais, weight loss, blurry vision, decreased visual acuity, cough, sputum, SOB, hemoptysis, pleuritic pain, palpitaitons, heartburn, abdominal pain, melena, lower extremity edema, claudication, or rash.  All other systems reviewed and negative   General: Affect appropriate Overweight white female HEENT: normal Neck supple with no adenopathy JVP normal no bruits no thyromegaly Lungs clear with no wheezing and good diaphragmatic motion Heart:  S1/S2 no murmur,rub, gallop or click PMI normal Abdomen: benighn, BS positve, no tenderness, no AAA no bruit.  No HSM or HJR Distal pulses intact with no bruits No edema Neuro non-focal Skin warm and dry No muscular weakness  Medications Current Outpatient Prescriptions  Medication Sig Dispense Refill  . albuterol (PROVENTIL HFA;VENTOLIN HFA) 108 (90 BASE) MCG/ACT inhaler Inhale 2 puffs into the lungs every 6 (six) hours as needed. For Asthma      . amitriptyline (ELAVIL) 25 MG tablet Take 25 mg by mouth at bedtime.       Marland Kitchen aspirin EC 81 MG EC tablet Take 1 tablet (81 mg total) by mouth daily.  30 tablet  1  . baclofen (LIORESAL) 10 MG tablet Take 10 mg by mouth 3 (three) times daily.       . citalopram (CELEXA) 40 MG tablet Take 40 mg by mouth at bedtime.       . diazepam (VALIUM) 2 MG tablet  Take 2 mg by mouth daily as needed. For spasms and she will take with her Baclofen      . diltiazem (CARDIZEM CD) 120 MG 24 hr capsule Take 1 capsule (120 mg total) by mouth daily.  30 capsule  1  . gabapentin (NEURONTIN) 300 MG capsule Take 300 mg by mouth 3 (three) times daily.        Marland Kitchen HYDROcodone-acetaminophen (LORTAB) 10-500 MG per tablet Take 1 tablet by mouth every 6 (six) hours as needed. MS pain      . interferon beta-1a (REBIF) 44 MCG/0.5ML injection Inject 44 mcg into the skin 3 (three) times a week. Take on Tuesdays, Thursdays and Sundays      . lubiprostone (AMITIZA) 24 MCG capsule Take 1 capsule (24 mcg total) by mouth 2 (two) times daily with a meal.  60 capsule  3  . oxybutynin (DITROPAN) 5 MG tablet Take 5 mg by mouth 2 (two) times daily.        . traZODone (DESYREL) 100 MG tablet Take 200 mg by mouth at bedtime.        Allergies Peanut-containing drug products; Latex; Prednisone; and Nexium  Family History: Family History  Problem Relation Age of Onset  . Colon cancer Neg Hx   . Cancer Mother     unknown type  . Diabetes Father   . Diabetes Sister     Social History: History   Social History  .  Marital Status: Married    Spouse Name: N/A    Number of Children: N/A  . Years of Education: N/A   Occupational History  . Not on file.   Social History Main Topics  . Smoking status: Never Smoker   . Smokeless tobacco: Not on file  . Alcohol Use: No  . Drug Use: No  . Sexually Active: Yes    Birth Control/ Protection: Surgical   Other Topics Concern  . Not on file   Social History Narrative   5 kids, raising grandchild    Electrocardiogram:  In hospital NSR normal x2 no pericardial changes  Assessment and Plan

## 2012-04-21 NOTE — Assessment & Plan Note (Signed)
Well controlled.  Continue current medications and low sodium Dash type diet.    

## 2012-04-21 NOTE — Assessment & Plan Note (Signed)
F/U babtist continue current meds

## 2012-04-21 NOTE — Assessment & Plan Note (Signed)
Atypical but persistant may be related to esophageal problems  Recent R/O in hosptial  F/U stress echo

## 2012-04-21 NOTE — Assessment & Plan Note (Signed)
Cholesterol is at goal.  Continue current dose of statin and diet Rx.  No myalgias or side effects.  F/U  LFT's in 6 months. Lab Results  Component Value Date   LDLCALC 86 04/17/2012

## 2012-04-21 NOTE — Patient Instructions (Addendum)
Your physician recommends that you schedule a follow-up appointment in: As needed   Your physician has requested that you have a stress echocardiogram. For further information please visit [email or web address]. Please follow instruction sheet as given.    

## 2012-04-28 ENCOUNTER — Ambulatory Visit (HOSPITAL_COMMUNITY): Admission: RE | Admit: 2012-04-28 | Payer: BC Managed Care – PPO | Source: Ambulatory Visit

## 2012-04-28 ENCOUNTER — Ambulatory Visit (HOSPITAL_COMMUNITY): Payer: BC Managed Care – PPO | Attending: Cardiovascular Disease

## 2012-05-24 ENCOUNTER — Encounter: Payer: Self-pay | Admitting: Internal Medicine

## 2012-05-25 ENCOUNTER — Telehealth: Payer: Self-pay | Admitting: Gastroenterology

## 2012-05-25 ENCOUNTER — Ambulatory Visit: Payer: BC Managed Care – PPO | Admitting: Gastroenterology

## 2012-05-25 NOTE — Telephone Encounter (Signed)
Pt was a no show

## 2012-05-25 NOTE — Progress Notes (Signed)
UR Chart Review Completed  

## 2012-06-13 ENCOUNTER — Other Ambulatory Visit: Payer: Self-pay

## 2012-06-13 DIAGNOSIS — A048 Other specified bacterial intestinal infections: Secondary | ICD-10-CM

## 2012-12-24 ENCOUNTER — Emergency Department (HOSPITAL_COMMUNITY)
Admission: EM | Admit: 2012-12-24 | Discharge: 2012-12-24 | Disposition: A | Discharge status: Home / Self Care | Payer: BC Managed Care – PPO | Attending: Emergency Medicine | Admitting: Emergency Medicine

## 2012-12-24 ENCOUNTER — Encounter (HOSPITAL_COMMUNITY): Payer: Self-pay | Admitting: *Deleted

## 2012-12-24 DIAGNOSIS — Z7982 Long term (current) use of aspirin: Secondary | ICD-10-CM | POA: Insufficient documentation

## 2012-12-24 DIAGNOSIS — G43909 Migraine, unspecified, not intractable, without status migrainosus: Secondary | ICD-10-CM | POA: Insufficient documentation

## 2012-12-24 DIAGNOSIS — N390 Urinary tract infection, site not specified: Secondary | ICD-10-CM | POA: Insufficient documentation

## 2012-12-24 DIAGNOSIS — Z91040 Latex allergy status: Secondary | ICD-10-CM | POA: Insufficient documentation

## 2012-12-24 DIAGNOSIS — Z3202 Encounter for pregnancy test, result negative: Secondary | ICD-10-CM | POA: Insufficient documentation

## 2012-12-24 DIAGNOSIS — Z8619 Personal history of other infectious and parasitic diseases: Secondary | ICD-10-CM | POA: Insufficient documentation

## 2012-12-24 DIAGNOSIS — Z9889 Other specified postprocedural states: Secondary | ICD-10-CM | POA: Insufficient documentation

## 2012-12-24 DIAGNOSIS — J45909 Unspecified asthma, uncomplicated: Secondary | ICD-10-CM | POA: Insufficient documentation

## 2012-12-24 DIAGNOSIS — G8929 Other chronic pain: Secondary | ICD-10-CM | POA: Insufficient documentation

## 2012-12-24 DIAGNOSIS — Z8719 Personal history of other diseases of the digestive system: Secondary | ICD-10-CM | POA: Insufficient documentation

## 2012-12-24 DIAGNOSIS — Z79899 Other long term (current) drug therapy: Secondary | ICD-10-CM | POA: Insufficient documentation

## 2012-12-24 DIAGNOSIS — R509 Fever, unspecified: Secondary | ICD-10-CM | POA: Insufficient documentation

## 2012-12-24 DIAGNOSIS — R110 Nausea: Secondary | ICD-10-CM | POA: Insufficient documentation

## 2012-12-24 DIAGNOSIS — Z792 Long term (current) use of antibiotics: Secondary | ICD-10-CM | POA: Insufficient documentation

## 2012-12-24 LAB — CBC WITH DIFFERENTIAL/PLATELET
Basophils Absolute: 0 10*3/uL (ref 0.0–0.1)
Eosinophils Relative: 3 % (ref 0–5)
HCT: 41.5 % (ref 36.0–46.0)
Hemoglobin: 14.1 g/dL (ref 12.0–15.0)
Lymphocytes Relative: 34 % (ref 12–46)
Lymphs Abs: 1.7 10*3/uL (ref 0.7–4.0)
Monocytes Absolute: 0.3 10*3/uL (ref 0.1–1.0)
Neutro Abs: 2.9 10*3/uL (ref 1.7–7.7)
RBC: 4.61 MIL/uL (ref 3.87–5.11)
RDW: 12.7 % (ref 11.5–15.5)
WBC: 5.1 10*3/uL (ref 4.0–10.5)

## 2012-12-24 LAB — BASIC METABOLIC PANEL
CO2: 27 mEq/L (ref 19–32)
Calcium: 9 mg/dL (ref 8.4–10.5)
Chloride: 100 mEq/L (ref 96–112)
Creatinine, Ser: 0.67 mg/dL (ref 0.50–1.10)
GFR calc Af Amer: >90 mL/min (ref >90)
Potassium: 3.5 mEq/L (ref 3.5–5.1)

## 2012-12-24 LAB — URINALYSIS, ROUTINE W REFLEX MICROSCOPIC
Bilirubin Urine: NEGATIVE
Glucose, UA: NEGATIVE mg/dL
Ketones, ur: NEGATIVE mg/dL
Leukocytes, UA: NEGATIVE
Nitrite: NEGATIVE
pH: 6 (ref 5.0–8.0)

## 2012-12-24 LAB — URINE MICROSCOPIC-ADD ON

## 2012-12-24 MED ORDER — MORPHINE SULFATE 4 MG/ML IJ SOLN
4.0000 mg | Freq: Once | INTRAMUSCULAR | Status: AC
  Administered 2012-12-24: 4 mg via INTRAVENOUS
  Filled 2012-12-24: qty 1

## 2012-12-24 MED ORDER — HYDROCODONE-ACETAMINOPHEN 5-325 MG PO TABS: 1.0000 | ORAL_TABLET | Freq: Four times a day (QID) | ORAL | Status: DC | PRN

## 2012-12-24 MED ORDER — ONDANSETRON HCL 4 MG/2ML IJ SOLN
4.0000 mg | Freq: Once | INTRAMUSCULAR | Status: AC
  Administered 2012-12-24: 4 mg via INTRAVENOUS
  Filled 2012-12-24: qty 2

## 2012-12-24 MED ORDER — CEPHALEXIN 500 MG PO CAPS: 500.0000 mg | ORAL_CAPSULE | Freq: Two times a day (BID) | ORAL | Status: DC

## 2012-12-24 MED ORDER — SODIUM CHLORIDE 0.9 % IV BOLUS (SEPSIS)
1000.0000 mL | Freq: Once | INTRAVENOUS | Status: AC
  Administered 2012-12-24: 1000 mL via INTRAVENOUS

## 2012-12-24 NOTE — ED Provider Notes (Signed)
CSN: [id number]     Arrival date & time 12/24/12  1942 History   First MD Initiated Contact with Patient 12/24/12 1953     Chief Complaint  Patient presents with  . Back Pain  . Nausea   (Consider location/radiation/quality/duration/timing/severity/associated sxs/prior Treatment) HPI  This is a 47 year old female with a history of MS and recurrent UTIs who presents with low back pain, nausea, and dysuria.  The patient reports a one-day history of bilateral lower back pain and dysuria. She reports decreased urine output. Patient also reports decreased by mouth intake. She endorses nausea without vomiting. She reports tactile fevers at home today. She states that this feels like a urinary tract infection.  She denies any headache, chest pain, shortness of breath, abdominal pain, focal weakness or numbness.  Past Medical History  Diagnosis Date  . Asthma   . MS (multiple sclerosis)   . Migraine headache   . GERD (gastroesophageal reflux disease)   . Chronic neck pain     C4-6 fusion  . Sleep apnea   . Helicobacter pylori gastritis 2011  . Fatty liver   . MIGRAINE, COMMON 05/14/2009    Qualifier: Diagnosis of  By: Ricard Dillon    . CONSTIPATION 05/16/2009    Qualifier: Diagnosis of  By: Yetta Barre FNP-BC, Kandice L   . LIVER FUNCTION TESTS, ABNORMAL, HX OF 05/14/2009    Qualifier: Diagnosis of  By: Ricard Dillon    . Dysphagia 02/23/2012   Past Surgical History  Procedure Laterality Date  . Neck surgery    . Appendectomy    . Cholecystectomy    . Abdominal hysterectomy    . Mandible surgery    . Colonoscopy  07/09/2009    RMR; normal rectum aside from anal canal hemorrhoids/scattered left-sided diverticula  . Esophagogastroduodenoscopy  05/22/2009    RMR; normal /small HH  . Esophagogastroduodenoscopy  2007    RMR: non-critical Schatzki's rin, non-maniuplated  . Back surgery  03/07/2012    Nerve Stimulator  . Esophagogastroduodenoscopy (egd) with esophageal dilation   03/17/2012    [id number] appearing esophageal mucosa of uncertain significance. Status post [name] dilation followed by esophageal bx   Family History  Problem Relation Age of Onset  . Colon cancer Neg Hx   . Cancer Mother     unknown type  . Diabetes Father   . Diabetes Sister    History  Substance Use Topics  . Smoking status: Never Smoker   . Smokeless tobacco: Not on file  . Alcohol Use: No   OB History   Grav Para Term Preterm Abortions TAB SAB Ect Mult Living                 Review of Systems  Constitutional: Positive for fever.  Respiratory: Negative for cough, chest tightness and shortness of breath.   Cardiovascular: Negative for chest pain.  Gastrointestinal: Positive for nausea. Negative for vomiting and abdominal pain.  Genitourinary: Positive for dysuria, urgency and decreased urine volume.  Musculoskeletal: Positive for back pain.  Skin: Negative for wound.  Neurological: Negative for dizziness and syncope.  Psychiatric/Behavioral: Negative for confusion.  All other systems reviewed and are negative.    Allergies  Peanut-containing drug products; Latex; Prednisone; and Nexium  Home Medications   Current Outpatient Rx  Name  Route  Sig  Dispense  Refill  . albuterol (PROVENTIL HFA;VENTOLIN HFA) 108 (90 BASE) MCG/ACT inhaler   Inhalation   Inhale 2 puffs into the lungs every 6 (six) hours as needed.  For Asthma         . amitriptyline (ELAVIL) 25 MG tablet   Oral   Take 25 mg by mouth at bedtime.          Marland Kitchen aspirin EC 81 MG EC tablet   Oral   Take 1 tablet (81 mg total) by mouth daily.   30 tablet   1   . baclofen (LIORESAL) 10 MG tablet   Oral   Take 10 mg by mouth 3 (three) times daily.          . citalopram (CELEXA) 40 MG tablet   Oral   Take 40 mg by mouth daily.          . diazepam (VALIUM) 2 MG tablet   Oral   Take 2 mg by mouth daily as needed. For spasms and she will take with her Baclofen         . diltiazem  (CARDIZEM CD) 120 MG 24 hr capsule   Oral   Take 1 capsule (120 mg total) by mouth daily.   30 capsule   1   . gabapentin (NEURONTIN) 300 MG capsule   Oral   Take 300 mg by mouth 3 (three) times daily.           Marland Kitchen HYDROcodone-acetaminophen (NORCO) 10-325 MG per tablet   Oral   Take 1 tablet by mouth every 6 (six) hours as needed for pain.         Marland Kitchen oxybutynin (DITROPAN) 5 MG tablet   Oral   Take 5 mg by mouth 2 (two) times daily.           . cephALEXin (KEFLEX) 500 MG capsule   Oral   Take 1 capsule (500 mg total) by mouth 2 (two) times daily.   14 capsule   0   . HYDROcodone-acetaminophen (NORCO/VICODIN) 5-325 MG per tablet   Oral   Take 1 tablet by mouth every 6 (six) hours as needed for pain.   10 tablet   0   . interferon beta-1a (REBIF) 44 MCG/0.5ML injection   Subcutaneous   Inject 44 mcg into the skin 3 (three) times a week. Take on Tuesdays, Thursdays and Sundays          BP 130/68  Pulse 82  Temp(Src) 98.4 F (36.9 C)  Resp 20  SpO2 94% Physical Exam  Nursing note and vitals reviewed. Constitutional: She is oriented to person, place, and time. She appears well-developed and well-nourished. No distress.  HENT:  Head: Normocephalic and atraumatic.  Mouth/Throat: Oropharynx is clear and moist.  Eyes: Pupils are equal, round, and reactive to light.  Neck: Neck supple.  Cardiovascular: Normal rate, regular rhythm and normal heart sounds.   No murmur heard. Pulmonary/Chest: Effort normal and breath sounds normal. No respiratory distress. She has no wheezes.  Abdominal: Soft. Bowel sounds are normal. She exhibits no distension. There is no tenderness.  Musculoskeletal:  No CVA tenderness noted. Bilateral lumbar paraspinous muscle tenderness.  Neurological: She is alert and oriented to person, place, and time.  Skin: Skin is warm and dry.  Psychiatric: She has a normal mood and affect.    ED Course  Procedures (including critical care time) Labs  Review Labs Reviewed  URINALYSIS, ROUTINE W REFLEX MICROSCOPIC - Abnormal; Notable for the following:    APPearance CLOUDY (*)    Specific Gravity, Urine >1.030 (*)    Protein, ur TRACE (*)    All other components within normal limits  BASIC METABOLIC PANEL - Abnormal; Notable for the following:    Glucose, Bld 132 (*)    All other components within normal limits  URINE MICROSCOPIC-ADD ON - Abnormal; Notable for the following:    Squamous Epithelial / LPF MANY (*)    Bacteria, UA MANY (*)    All other components within normal limits  URINE CULTURE  PREGNANCY, URINE  CBC WITH DIFFERENTIAL   Imaging Review No results found.  MDM   1. Urinary tract infection    This is a 47 year old female who presents with dysuria and back pain. She is nontoxic and afebrile on examination. She has no CVA tenderness. Patient was able to spontaneously void with postvoid residual of 32 cc. Basic labwork was obtained. Patient was given morphine and Zofran with some improvement of her symptoms. Urinalysis is difficult to interpret as it has many epithelial cells and many bacteria. Culture was sent. Given that patient has a history of recurrent UTIs with similar symptoms, I will appears to treat her with Keflex. Otherwise patient workup is within normal limits. I have low suspicion for pyelonephritis as the patient is afebrile here and has bilateral lumbar tenderness which is likely musculoskeletal in origin.  Patient will followup with her primary care physician on Monday. She was given strict return precautions.  After history, exam, and medical workup I feel the patient has been appropriately medically screened and is safe for discharge home. Pertinent diagnoses were discussed with the patient. Patient was given return precautions.   Shon Baton, MD 12/24/12 2218

## 2012-12-24 NOTE — ED Notes (Signed)
Pt reporting pain in lower back, as well as nausea.  Reports difficulty urinating, but unsure if that is because she hasn't been able to drink today.  Pt does report urine is dark and some pain when she does urinate.

## 2012-12-24 NOTE — ED Notes (Signed)
Pt c/o lower mid back pain with constant nausea since this morning. States she has been unable to urinate since this morning as she hasn't been able to keep any liquids down.

## 2012-12-27 LAB — URINE CULTURE: Colony Count: NO GROWTH

## 2013-02-10 ENCOUNTER — Other Ambulatory Visit (HOSPITAL_COMMUNITY): Payer: Self-pay | Admitting: Internal Medicine

## 2013-02-10 DIAGNOSIS — Z139 Encounter for screening, unspecified: Secondary | ICD-10-CM

## 2013-02-10 DIAGNOSIS — Z0000 Encounter for general adult medical examination without abnormal findings: Secondary | ICD-10-CM

## 2013-02-20 ENCOUNTER — Ambulatory Visit (HOSPITAL_COMMUNITY): Payer: BC Managed Care – PPO

## 2013-03-09 DIAGNOSIS — H21563 Pupillary abnormality, bilateral: Secondary | ICD-10-CM | POA: Insufficient documentation

## 2013-04-03 IMAGING — CT CT ANGIO CHEST
2 of 6 series · 6 of 36 positions shown · IV contrast (Omnipaque 300)
Comparison: 03/01/2010

CLINICAL DATA: Atypical chest pain.

CT ANGIOGRAPHY CHEST
TECHNIQUE: Multidetector CT imaging of the chest using the
standard protocol during bolus administration of intravenous
contrast. Multiplanar reconstructed images including MIPs were
obtained and reviewed to evaluate the vascular anatomy.
Contrast: 100mL OMNIPAQUE IOHEXOL 350 MG/ML SOLN

[Series 4: pe 3.0 b40f · axial · 0.68mm/px · z∈[+766,+946]mm · 5 of 90 slices shown]
[im 15/90  lung]
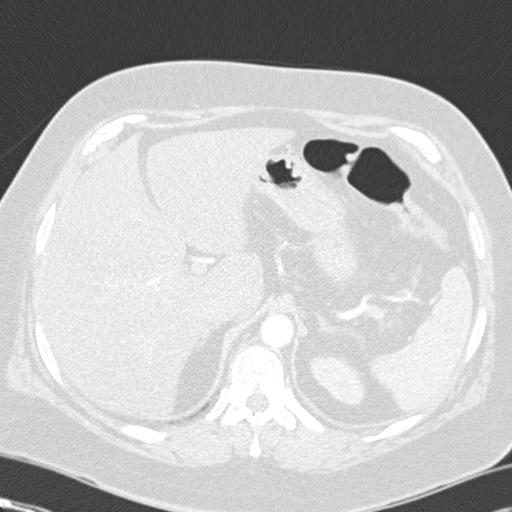
[im 30/90  mediastinal]
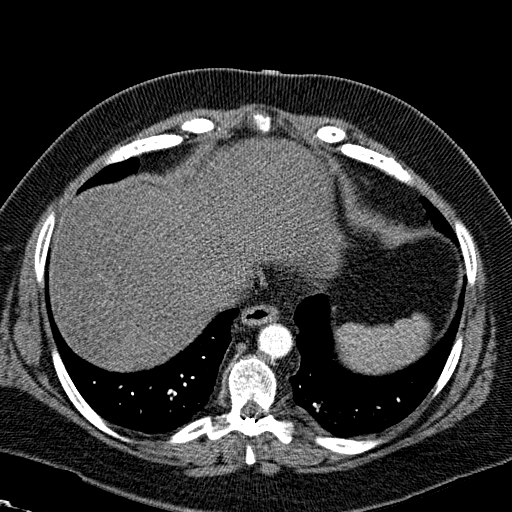
[im 45/90  lung]
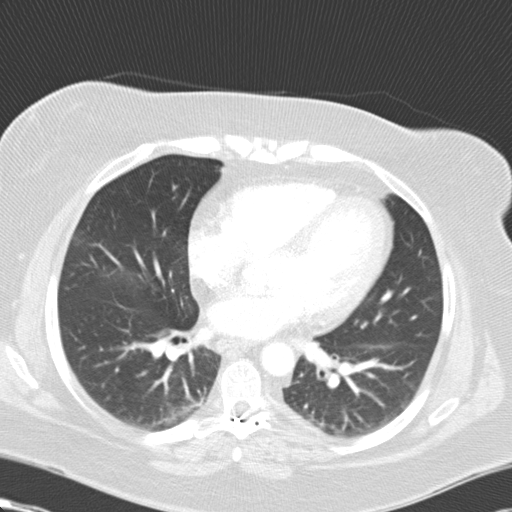
[im 60/90  mediastinal]
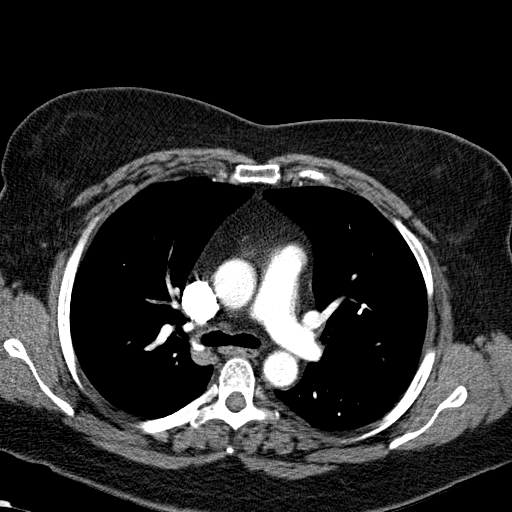
[im 75/90  lung]
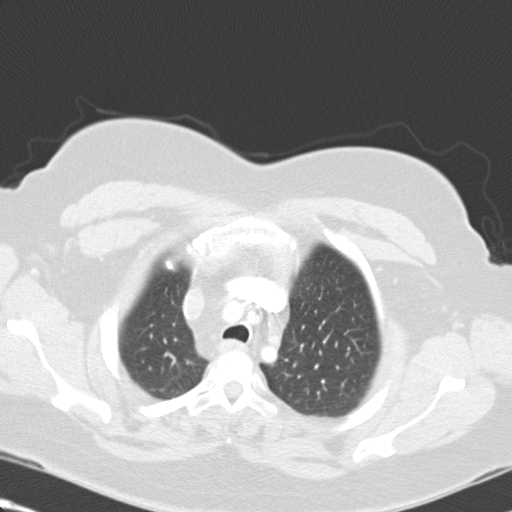

[Series 6: mpr coronal pe 3mm · coronal · 0.51mm/px · 1 of 96 slices shown]
[im 48/96  mediastinal]
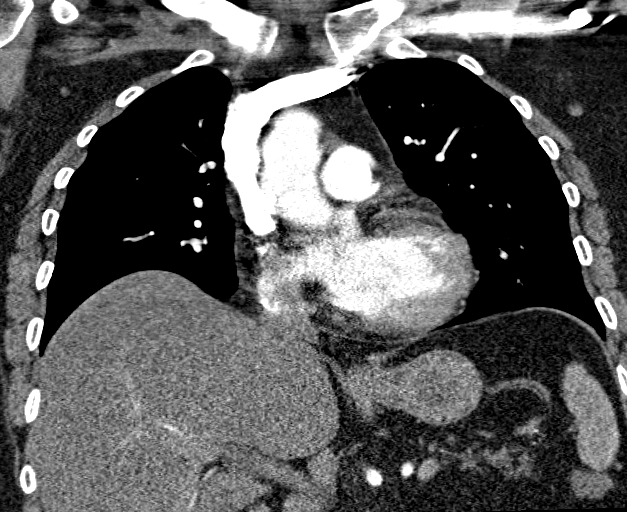

[6 of 36 positions shown; findings below may reference images not displayed]

FINDINGS: Technically adequate study with good opacification of the
central and segmental pulmonary arteries.  No focal filling
defects.  No evidence of significant pulmonary embolus.

Normal heart size.  Normal caliber thoracic aorta.  No abnormal
lymphadenopathy in the chest.  Esophagus is decompressed.  The
visualized portions of the upper abdomen demonstrates diffuse fatty
infiltration of the liver, previous cholecystectomy and small
accessory spleen.  No pleural effusions.

There is mild atelectasis in the lung bases, likely representing
dependent atelectasis.  No focal or diffuse airspace disease.  No
significant interstitial change.  The airways appear patent.
Normal alignment of the thoracic vertebrae.  The thoracic spinal
stimulator leads.
IMPRESSION: No evidence of significant pulmonary embolus.  Mild dependent
atelectasis in the lungs.  No significant change since previous
study.

## 2013-05-01 ENCOUNTER — Ambulatory Visit (HOSPITAL_COMMUNITY): Payer: BC Managed Care – PPO

## 2013-05-19 DIAGNOSIS — G44229 Chronic tension-type headache, not intractable: Secondary | ICD-10-CM | POA: Insufficient documentation

## 2013-08-01 ENCOUNTER — Other Ambulatory Visit (HOSPITAL_COMMUNITY): Payer: Self-pay | Admitting: Internal Medicine

## 2013-08-01 DIAGNOSIS — Z0000 Encounter for general adult medical examination without abnormal findings: Secondary | ICD-10-CM

## 2013-08-07 ENCOUNTER — Ambulatory Visit (HOSPITAL_COMMUNITY): Payer: BC Managed Care – PPO

## 2013-09-12 ENCOUNTER — Other Ambulatory Visit (HOSPITAL_COMMUNITY): Payer: Self-pay | Admitting: Internal Medicine

## 2013-09-12 DIAGNOSIS — R109 Unspecified abdominal pain: Secondary | ICD-10-CM

## 2013-09-14 ENCOUNTER — Encounter: Payer: Self-pay | Admitting: *Deleted

## 2013-09-15 ENCOUNTER — Ambulatory Visit (HOSPITAL_COMMUNITY): Admission: RE | Admit: 2013-09-15 | Payer: BC Managed Care – PPO | Source: Ambulatory Visit

## 2013-10-19 ENCOUNTER — Encounter (INDEPENDENT_AMBULATORY_CARE_PROVIDER_SITE_OTHER): Payer: Self-pay

## 2013-10-19 ENCOUNTER — Ambulatory Visit (INDEPENDENT_AMBULATORY_CARE_PROVIDER_SITE_OTHER): Payer: BC Managed Care – PPO | Admitting: Gastroenterology

## 2013-10-19 ENCOUNTER — Other Ambulatory Visit: Payer: Self-pay | Admitting: Internal Medicine

## 2013-10-19 ENCOUNTER — Encounter (HOSPITAL_COMMUNITY): Payer: Self-pay | Admitting: Pharmacy Technician

## 2013-10-19 ENCOUNTER — Encounter: Payer: Self-pay | Admitting: Gastroenterology

## 2013-10-19 VITALS — BP 118/79 | HR 99 | Temp 97.9°F | Ht 65.0 in | Wt 232.4 lb

## 2013-10-19 DIAGNOSIS — R1310 Dysphagia, unspecified: Secondary | ICD-10-CM

## 2013-10-19 DIAGNOSIS — R1319 Other dysphagia: Secondary | ICD-10-CM

## 2013-10-19 DIAGNOSIS — R1013 Epigastric pain: Secondary | ICD-10-CM

## 2013-10-19 DIAGNOSIS — K5900 Constipation, unspecified: Secondary | ICD-10-CM

## 2013-10-19 DIAGNOSIS — K7689 Other specified diseases of liver: Secondary | ICD-10-CM

## 2013-10-19 DIAGNOSIS — K5909 Other constipation: Secondary | ICD-10-CM | POA: Insufficient documentation

## 2013-10-19 MED ORDER — LINACLOTIDE 145 MCG PO CAPS: 145.0000 ug | ORAL_CAPSULE | Freq: Every day | ORAL | Status: DC

## 2013-10-19 MED ORDER — PANTOPRAZOLE SODIUM 40 MG PO TBEC: 40.0000 mg | DELAYED_RELEASE_TABLET | Freq: Every day | ORAL | Status: DC

## 2013-10-19 NOTE — Assessment & Plan Note (Signed)
F/u on labs from PCP. Patient reports her LFTs have been normal recently.   Instructions for fatty liver: Recommend 1-2# weight loss per week until ideal body weight through exercise & diet. Low fat/cholesterol diet.   Avoid sweets, sodas, fruit juices, sweetened beverages like tea, etc. Gradually increase exercise from 15 min daily up to 1 hr per day 5 days/week. Limit alcohol use.

## 2013-10-19 NOTE — Progress Notes (Signed)
Primary Care Physician:  Cassell Smiles., MD  Primary Gastroenterologist:  Roetta Sessions, MD   Chief Complaint  Patient presents with  . Abdominal Pain    x 2 months    HPI:  [name] is a 48 y.o. female here at the request of Dr. Sherwood Gambler for further evaluation of abdominal pain. She was last seen in November 2013 for dysphagia. She has a history of fatty liver, constipation as well. EGD in the summer 2013 showed H. pylori gastritis. Patient has been treated on multiple occasions for H. pylori with Pylera and Prevpac. She was supposed to have a followup H. pylori stool antigen after her last treatment to verify eradication but she did not have this done.  She has tried and failed Protonix, Prilosec, allergic to Nexium.  Has tried Dexilant when we last saw her.  Two month history of epigastric pain with very little relief. Doesn't matter what she eats or drinks. Had labs done with PCP. Was supposed to do u/s but did not remember to do it. No heartburn. 08/2013 biaxin, prevacid, amoxicillin for positive H.Pylori serologies. BM constipation. Once per week. Hard to go. No melena, brbpr. C/o solid food dysphagia, prior esophageal dilation beneficial.  Current Outpatient Prescriptions  Medication Sig Dispense Refill  . albuterol (PROVENTIL HFA;VENTOLIN HFA) 108 (90 BASE) MCG/ACT inhaler Inhale 2 puffs into the lungs every 6 (six) hours as needed. For Asthma      . amitriptyline (ELAVIL) 25 MG tablet Take 100 mg by mouth at bedtime.       Marland Kitchen aspirin EC 81 MG EC tablet Take 1 tablet (81 mg total) by mouth daily.  30 tablet  1  . baclofen (LIORESAL) 10 MG tablet Take 10 mg by mouth 3 (three) times daily.       . citalopram (CELEXA) 40 MG tablet Take 40 mg by mouth daily.       Marland Kitchen COPAXONE 40 MG/ML SOSY Inject into the muscle 3 (three) times a week.      . diazepam (VALIUM) 2 MG tablet Take 5 mg by mouth daily as needed. For spasms and she will take with her Baclofen      . diltiazem (CARDIZEM CD) 120  MG 24 hr capsule Take 1 capsule (120 mg total) by mouth daily.  30 capsule  1  . gabapentin (NEURONTIN) 300 MG capsule Take 600 mg by mouth 3 (three) times daily.       Marland Kitchen HYDROcodone-acetaminophen (NORCO) 10-325 MG per tablet Take 1 tablet by mouth every 6 (six) hours as needed for pain.      Marland Kitchen lansoprazole (PREVACID) 30 MG capsule Take 30 mg by mouth daily.      . meloxicam (MOBIC) 15 MG tablet 15 mg daily.      Marland Kitchen oxybutynin (DITROPAN) 5 MG tablet Take 5 mg by mouth 2 (two) times daily.        Marland Kitchen topiramate (TOPAMAX) 100 MG tablet 100mg  in am, 200mg  in pm      .       .        No current facility-administered medications for this visit.    Allergies as of 10/19/2013 - Review Complete 10/19/2013  Allergen Reaction Noted  . Peanut-containing drug products Anaphylaxis   . Latex Other (See Comments) 01/15/2011  . Prednisone Hives and Other (See Comments)   . Nexium [esomeprazole magnesium] Hives 08/15/2011    Past Medical History  Diagnosis Date  . Asthma   . MS (multiple  sclerosis)   . Migraine headache   . GERD (gastroesophageal reflux disease)   . Chronic neck pain     C4-6 fusion  . Sleep apnea   . Helicobacter pylori gastritis 2011  . Fatty liver   . MIGRAINE, COMMON 05/14/2009    Qualifier: Diagnosis of  By: Ricard DillonUrshel, [place]    . CONSTIPATION 05/16/2009    Qualifier: Diagnosis of  By: Yetta BarreJones FNP-BC, Kandice L   . LIVER FUNCTION TESTS, ABNORMAL, HX OF 05/14/2009    Qualifier: Diagnosis of  By: Ricard DillonUrshel, [place]    . Dysphagia 02/23/2012    Past Surgical History  Procedure Laterality Date  . Neck surgery    . Appendectomy    . Cholecystectomy    . Abdominal hysterectomy    . Mandible surgery    . Colonoscopy  07/09/2009    RMR; normal rectum aside from anal canal hemorrhoids/scattered left-sided diverticula  . Esophagogastroduodenoscopy  05/22/2009    RMR; normal /small HH  . Esophagogastroduodenoscopy  2007    RMR: non-critical Schatzki's rin, non-maniuplated  . Back  surgery  03/07/2012    Nerve Stimulator  . Esophagogastroduodenoscopy (egd) with esophageal dilation  03/17/2012    [id number]:Abnormal appearing esophageal mucosa of uncertain significance. Status post Elease HashimotoMaloney dilation followed by esophageal bx    Family History  Problem Relation Age of Onset  . Colon cancer Neg Hx   . Cancer Mother     unknown type  . Diabetes Father   . Diabetes Sister     History   Social History  . Marital Status: Married    Spouse Name: N/A    Number of Children: N/A  . Years of Education: N/A   Occupational History  . Not on file.   Social History Main Topics  . Smoking status: Never Smoker   . Smokeless tobacco: Not on file  . Alcohol Use: No  . Drug Use: No  . Sexual Activity: Yes    Birth Control/ Protection: Surgical   Other Topics Concern  . Not on file   Social History Narrative   5 kids, raising grandchild      ROS:  General: Negative for anorexia, weight loss, fever, chills, fatigue, weakness. Eyes: Negative for vision changes.  ENT: Negative for hoarseness,  nasal congestion. CV: Negative for chest pain, angina, palpitations, dyspnea on exertion, peripheral edema.  Respiratory: Negative for dyspnea at rest, dyspnea on exertion, cough, sputum, wheezing.  GI: See history of present illness. GU:  Negative for dysuria, hematuria, urinary incontinence, urinary frequency, nocturnal urination.  MS: Negative for joint pain, low back pain.  Derm: Negative for rash or itching.  Neuro: Negative for weakness, abnormal sensation, seizure, frequent headaches, memory loss, confusion.  Psych: Negative for anxiety, depression, suicidal ideation, hallucinations.  Endo: Negative for unusual weight change.  Heme: Negative for bruising or bleeding. Allergy: Negative for rash or hives.    Physical Examination:  BP 118/79  Pulse 99  Temp(Src) 97.9 F (36.6 C) (Oral)  Ht 5\' 5"  (1.651 m)  Wt 232 lb 6.4 oz (105.416 kg)  BMI 38.67 kg/m2   General:  Well-nourished, well-developed in no acute distress.  Head: Normocephalic, atraumatic.   Eyes: Conjunctiva pink, no icterus. Mouth: Oropharyngeal mucosa moist and pink , no lesions erythema or exudate. Neck: Supple without thyromegaly, masses, or lymphadenopathy.  Lungs: Clear to auscultation bilaterally.  Heart: Regular rate and rhythm, no murmurs rubs or gallops.  Abdomen: Bowel sounds are normal, moderate epigastric tenderness, nondistended, no hepatosplenomegaly or  masses, no abdominal bruits or    hernia , no rebound or guarding.   Rectal: not performed Extremities: No lower extremity edema. No clubbing or deformities.  Neuro: Alert and oriented x 4 , grossly normal neurologically.  Skin: Warm and dry, no rash or jaundice.   Psych: Alert and cooperative, normal mood and affect.

## 2013-10-19 NOTE — Assessment & Plan Note (Signed)
Chronic constipation. Trial of Linzess daily. RX with rebate card/voucher provided.

## 2013-10-19 NOTE — Assessment & Plan Note (Signed)
Two month history of worsening epigastric pain sometimes better with food and sometimes worse pp. No typical heartburn. Has been on Mobic, with recent increase to 15mg  daily in 08/2013. Patient on Prevacid for four weeks with no noted improvement. Treated for at least the 4th time now for H.pylori. Of note, [name] did have bx proven H.pylori 2013 (previously was treated twice before). Prevpac, Pylera X 2. Educated patient today that H.pylori serologies may be positive life long and to verify eradication, H.pylori stool antigen recommended. Given symptoms however, would advise EGD +/- ED (c/o solid food dysphagia) at this time, biopsies can be done to check for H.pylori as appropriate.  I have discussed the risks, alternatives, benefits with regards to but not limited to the risk of reaction to medication, bleeding, infection, perforation and the patient is agreeable to proceed. Written consent to be obtained.  I have requested copies of her most recent labs.  Once [name] completes current RX for prevacid, trial of pantoprazole 40mg  daily.

## 2013-10-19 NOTE — Patient Instructions (Signed)
1. I will review your labs from your PCP. 2. Stop prevacid when you have finished your last pill. Then start pantoprazole one daily before breakfast. RX sent to pharmacy. 3. Start Linzess once daily on empty stomach for constipation. Voucher for 30 day supply provided. Rebate card given. RX sent to pharmacy. 4. Upper endoscopy with Dr. Jena Gauss. See separate instructions.

## 2013-10-24 ENCOUNTER — Encounter (HOSPITAL_COMMUNITY)
Admission: RE | Disposition: A | Discharge status: Home / Self Care | Payer: Self-pay | Source: Ambulatory Visit | Attending: Internal Medicine

## 2013-10-24 ENCOUNTER — Encounter (HOSPITAL_COMMUNITY): Payer: Self-pay | Admitting: *Deleted

## 2013-10-24 ENCOUNTER — Ambulatory Visit (HOSPITAL_COMMUNITY)
Admission: RE | Admit: 2013-10-24 | Discharge: 2013-10-24 | Disposition: A | Discharge status: Home / Self Care | Payer: BC Managed Care – PPO | Source: Ambulatory Visit | Attending: Internal Medicine | Admitting: Internal Medicine

## 2013-10-24 DIAGNOSIS — K2970 Gastritis, unspecified, without bleeding: Secondary | ICD-10-CM | POA: Insufficient documentation

## 2013-10-24 DIAGNOSIS — R1013 Epigastric pain: Secondary | ICD-10-CM | POA: Insufficient documentation

## 2013-10-24 DIAGNOSIS — R1310 Dysphagia, unspecified: Secondary | ICD-10-CM

## 2013-10-24 DIAGNOSIS — Z79899 Other long term (current) drug therapy: Secondary | ICD-10-CM | POA: Insufficient documentation

## 2013-10-24 DIAGNOSIS — R1319 Other dysphagia: Secondary | ICD-10-CM

## 2013-10-24 DIAGNOSIS — K5900 Constipation, unspecified: Secondary | ICD-10-CM | POA: Insufficient documentation

## 2013-10-24 DIAGNOSIS — K3189 Other diseases of stomach and duodenum: Secondary | ICD-10-CM | POA: Insufficient documentation

## 2013-10-24 DIAGNOSIS — K2990 Gastroduodenitis, unspecified, without bleeding: Principal | ICD-10-CM

## 2013-10-24 DIAGNOSIS — Z7982 Long term (current) use of aspirin: Secondary | ICD-10-CM | POA: Insufficient documentation

## 2013-10-24 DIAGNOSIS — A048 Other specified bacterial intestinal infections: Secondary | ICD-10-CM | POA: Insufficient documentation

## 2013-10-24 HISTORY — PX: MALONEY DILATION: SHX5535

## 2013-10-24 HISTORY — PX: ESOPHAGOGASTRODUODENOSCOPY: SHX5428

## 2013-10-24 SURGERY — EGD (ESOPHAGOGASTRODUODENOSCOPY)
Anesthesia: Moderate Sedation

## 2013-10-24 MED ORDER — MIDAZOLAM HCL 5 MG/5ML IJ SOLN
INTRAMUSCULAR | Status: DC | PRN
  Administered 2013-10-24 (×2): 2 mg via INTRAVENOUS

## 2013-10-24 MED ORDER — SODIUM CHLORIDE 0.9 % IV SOLN
INTRAVENOUS | Status: DC
  Administered 2013-10-24: 09:00:00 via INTRAVENOUS

## 2013-10-24 MED ORDER — LIDOCAINE VISCOUS 2 % MT SOLN
OROMUCOSAL | Status: DC | PRN
  Administered 2013-10-24: 3 mL via OROMUCOSAL

## 2013-10-24 MED ORDER — MEPERIDINE HCL 100 MG/ML IJ SOLN
INTRAMUSCULAR | Status: AC
  Filled 2013-10-24: qty 2

## 2013-10-24 MED ORDER — ONDANSETRON HCL 4 MG/2ML IJ SOLN
INTRAMUSCULAR | Status: DC | PRN
  Administered 2013-10-24: 4 mg via INTRAVENOUS

## 2013-10-24 MED ORDER — ONDANSETRON HCL 4 MG/2ML IJ SOLN
INTRAMUSCULAR | Status: AC
  Filled 2013-10-24: qty 2

## 2013-10-24 MED ORDER — STERILE WATER FOR IRRIGATION IR SOLN
Status: DC | PRN
  Administered 2013-10-24: 09:00:00

## 2013-10-24 MED ORDER — MEPERIDINE HCL 100 MG/ML IJ SOLN
INTRAMUSCULAR | Status: DC | PRN
  Administered 2013-10-24: 50 mg via INTRAVENOUS
  Administered 2013-10-24: 25 mg via INTRAVENOUS

## 2013-10-24 MED ORDER — MIDAZOLAM HCL 5 MG/5ML IJ SOLN
INTRAMUSCULAR | Status: AC
  Filled 2013-10-24: qty 10

## 2013-10-24 MED ORDER — LIDOCAINE VISCOUS 2 % MT SOLN
OROMUCOSAL | Status: AC
  Filled 2013-10-24: qty 15

## 2013-10-24 NOTE — Progress Notes (Signed)
cc'd to pcp 

## 2013-10-24 NOTE — Op Note (Signed)
[facility] [address] [place], [address]   ENDOSCOPY PROCEDURE REPORT  PATIENT: [name], [name]  MR#: [id number] BIRTHDATE: November 01, 1965 , 48  yrs. old GENDER: Female ENDOSCOPIST: R.  Roetta Sessions, MD FACP FACG REFERRED BY:  Artis Delay, M.D. PROCEDURE DATE:  10/24/2013 PROCEDURE:     EGD with [name] dilation followed by gastric biopsy  INDICATIONS:     esophageal dysphagia; dyspepsia; constipation much better on linzess  INFORMED CONSENT:   The risks, benefits, limitations, alternatives and imponderables have been discussed.  The potential for biopsy, esophogeal dilation, etc. have also been reviewed.  Questions have been answered.  All parties agreeable.  Please see the history and physical in the medical record for more information.  MEDICATIONS: Versed 4 mg IV and Demerol 75 mg IV in divided doses. Zofran 4 mg IV.Xylocaine gel topically  DESCRIPTION OF PROCEDURE:   The IF-0277A ([id number])  endoscope was introduced through the mouth and advanced to the second portion of the duodenum without difficulty or limitations.  The mucosal surfaces were surveyed very carefully during advancement of the scope and upon withdrawal.  Retroflexion view of the proximal stomach and esophagogastric junction was performed.      FINDINGS:  Normal-appearing, patent appearing tubular esophagus. Stomach empty. "diffuse snake skinning" or fish scale appearance of the gastric mucosa. No ulcer or infiltrating process. No gastric varices.  Patent pylorus. Normal first and second portion of the duodenum  THERAPEUTIC / DIAGNOSTIC MANEUVERS PERFORMED:  a 72 French Maloney dilator was passed to full insertion easily. A look back revealed no apparent complication related to this maneuver. Subsequently, biopsies abnormal gastric mucosa taken   COMPLICATIONS:  None  IMPRESSION:  Normal esophagus status post Maloney dilation. Abnormal gastric mucosa  -  query persisting H. pylori  infection versus portal gastropathy status post biopsy  RECOMMENDATIONS:  Continue over-the-counter Prevacid. Continued Linzess. Followup on pathology.    _______________________________ R. Roetta Sessions, MD FACP [facility] eSigned:  R. Roetta Sessions, MD FACP [facility] 10/24/2013 9:51 AM     CC:

## 2013-10-24 NOTE — H&P (View-Only) (Signed)
Primary Care Physician:  Cassell Smiles., MD  Primary Gastroenterologist:  Roetta Sessions, MD   Chief Complaint  Patient presents with  . Abdominal Pain    x 2 months    HPI:  [name] is a 48 y.o. female here at the request of Dr. Sherwood Gambler for further evaluation of abdominal pain. She was last seen in November 2013 for dysphagia. She has a history of fatty liver, constipation as well. EGD in the summer 2013 showed H. pylori gastritis. Patient has been treated on multiple occasions for H. pylori with Pylera and Prevpac. She was supposed to have a followup H. pylori stool antigen after her last treatment to verify eradication but she did not have this done.  She has tried and failed Protonix, Prilosec, allergic to Nexium.  Has tried Dexilant when we last saw her.  Two month history of epigastric pain with very little relief. Doesn't matter what she eats or drinks. Had labs done with PCP. Was supposed to do u/s but did not remember to do it. No heartburn. 08/2013 biaxin, prevacid, amoxicillin for positive H.Pylori serologies. BM constipation. Once per week. Hard to go. No melena, brbpr. C/o solid food dysphagia, prior esophageal dilation beneficial.  Current Outpatient Prescriptions  Medication Sig Dispense Refill  . albuterol (PROVENTIL HFA;VENTOLIN HFA) 108 (90 BASE) MCG/ACT inhaler Inhale 2 puffs into the lungs every 6 (six) hours as needed. For Asthma      . amitriptyline (ELAVIL) 25 MG tablet Take 100 mg by mouth at bedtime.       Marland Kitchen aspirin EC 81 MG EC tablet Take 1 tablet (81 mg total) by mouth daily.  30 tablet  1  . baclofen (LIORESAL) 10 MG tablet Take 10 mg by mouth 3 (three) times daily.       . citalopram (CELEXA) 40 MG tablet Take 40 mg by mouth daily.       Marland Kitchen COPAXONE 40 MG/ML SOSY Inject into the muscle 3 (three) times a week.      . diazepam (VALIUM) 2 MG tablet Take 5 mg by mouth daily as needed. For spasms and she will take with her Baclofen      . diltiazem (CARDIZEM CD) 120  MG 24 hr capsule Take 1 capsule (120 mg total) by mouth daily.  30 capsule  1  . gabapentin (NEURONTIN) 300 MG capsule Take 600 mg by mouth 3 (three) times daily.       Marland Kitchen HYDROcodone-acetaminophen (NORCO) 10-325 MG per tablet Take 1 tablet by mouth every 6 (six) hours as needed for pain.      Marland Kitchen lansoprazole (PREVACID) 30 MG capsule Take 30 mg by mouth daily.      . meloxicam (MOBIC) 15 MG tablet 15 mg daily.      Marland Kitchen oxybutynin (DITROPAN) 5 MG tablet Take 5 mg by mouth 2 (two) times daily.        Marland Kitchen topiramate (TOPAMAX) 100 MG tablet 100mg  in am, 200mg  in pm      .       .        No current facility-administered medications for this visit.    Allergies as of 10/19/2013 - Review Complete 10/19/2013  Allergen Reaction Noted  . Peanut-containing drug products Anaphylaxis   . Latex Other (See Comments) 01/15/2011  . Prednisone Hives and Other (See Comments)   . Nexium [esomeprazole magnesium] Hives 08/15/2011    Past Medical History  Diagnosis Date  . Asthma   . MS (multiple  sclerosis)   . Migraine headache   . GERD (gastroesophageal reflux disease)   . Chronic neck pain     C4-6 fusion  . Sleep apnea   . Helicobacter pylori gastritis 2011  . Fatty liver   . MIGRAINE, COMMON 05/14/2009    Qualifier: Diagnosis of  By: Ricard DillonUrshel, [place]    . CONSTIPATION 05/16/2009    Qualifier: Diagnosis of  By: Yetta BarreJones FNP-BC, Kandice L   . LIVER FUNCTION TESTS, ABNORMAL, HX OF 05/14/2009    Qualifier: Diagnosis of  By: Ricard DillonUrshel, [place]    . Dysphagia 02/23/2012    Past Surgical History  Procedure Laterality Date  . Neck surgery    . Appendectomy    . Cholecystectomy    . Abdominal hysterectomy    . Mandible surgery    . Colonoscopy  07/09/2009    RMR; normal rectum aside from anal canal hemorrhoids/scattered left-sided diverticula  . Esophagogastroduodenoscopy  05/22/2009    RMR; normal /small HH  . Esophagogastroduodenoscopy  2007    RMR: non-critical Schatzki's rin, non-maniuplated  . Back  surgery  03/07/2012    Nerve Stimulator  . Esophagogastroduodenoscopy (egd) with esophageal dilation  03/17/2012    ZOX:[id number]:Abnormal appearing esophageal mucosa of uncertain significance. Status post Elease HashimotoMaloney dilation followed by esophageal bx    Family History  Problem Relation Age of Onset  . Colon cancer Neg Hx   . Cancer Mother     unknown type  . Diabetes Father   . Diabetes Sister     History   Social History  . Marital Status: Married    Spouse Name: N/A    Number of Children: N/A  . Years of Education: N/A   Occupational History  . Not on file.   Social History Main Topics  . Smoking status: Never Smoker   . Smokeless tobacco: Not on file  . Alcohol Use: No  . Drug Use: No  . Sexual Activity: Yes    Birth Control/ Protection: Surgical   Other Topics Concern  . Not on file   Social History Narrative   5 kids, raising grandchild      ROS:  General: Negative for anorexia, weight loss, fever, chills, fatigue, weakness. Eyes: Negative for vision changes.  ENT: Negative for hoarseness,  nasal congestion. CV: Negative for chest pain, angina, palpitations, dyspnea on exertion, peripheral edema.  Respiratory: Negative for dyspnea at rest, dyspnea on exertion, cough, sputum, wheezing.  GI: See history of present illness. GU:  Negative for dysuria, hematuria, urinary incontinence, urinary frequency, nocturnal urination.  MS: Negative for joint pain, low back pain.  Derm: Negative for rash or itching.  Neuro: Negative for weakness, abnormal sensation, seizure, frequent headaches, memory loss, confusion.  Psych: Negative for anxiety, depression, suicidal ideation, hallucinations.  Endo: Negative for unusual weight change.  Heme: Negative for bruising or bleeding. Allergy: Negative for rash or hives.    Physical Examination:  BP 118/79  Pulse 99  Temp(Src) 97.9 F (36.6 C) (Oral)  Ht 5\' 5"  (1.651 m)  Wt 232 lb 6.4 oz (105.416 kg)  BMI 38.67 kg/m2   General:  Well-nourished, well-developed in no acute distress.  Head: Normocephalic, atraumatic.   Eyes: Conjunctiva pink, no icterus. Mouth: Oropharyngeal mucosa moist and pink , no lesions erythema or exudate. Neck: Supple without thyromegaly, masses, or lymphadenopathy.  Lungs: Clear to auscultation bilaterally.  Heart: Regular rate and rhythm, no murmurs rubs or gallops.  Abdomen: Bowel sounds are normal, moderate epigastric tenderness, nondistended, no hepatosplenomegaly or  masses, no abdominal bruits or    hernia , no rebound or guarding.   Rectal: not performed Extremities: No lower extremity edema. No clubbing or deformities.  Neuro: Alert and oriented x 4 , grossly normal neurologically.  Skin: Warm and dry, no rash or jaundice.   Psych: Alert and cooperative, normal mood and affect.

## 2013-10-24 NOTE — Discharge Instructions (Signed)
EGD Discharge instructions Please read the instructions outlined below and refer to this sheet in the next few weeks. These discharge instructions provide you with general information on caring for yourself after you leave the hospital. Your doctor may also give you specific instructions. While your treatment has been planned according to the most current medical practices available, unavoidable complications occasionally occur. If you have any problems or questions after discharge, please call your doctor. ACTIVITY  You may resume your regular activity but move at a slower pace for the next 24 hours.   Take frequent rest periods for the next 24 hours.   Walking will help expel (get rid of) the air and reduce the bloated feeling in your abdomen.   No driving for 24 hours (because of the anesthesia (medicine) used during the test).   You may shower.   Do not sign any important legal documents or operate any machinery for 24 hours (because of the anesthesia used during the test).  NUTRITION  Drink plenty of fluids.   You may resume your normal diet.   Begin with a light meal and progress to your normal diet.   Avoid alcoholic beverages for 24 hours or as instructed by your caregiver.  MEDICATIONS  You may resume your normal medications unless your caregiver tells you otherwise.  WHAT YOU CAN EXPECT TODAY  You may experience abdominal discomfort such as a feeling of fullness or gas pains.  FOLLOW-UP  Your doctor will discuss the results of your test with you.  SEEK IMMEDIATE MEDICAL ATTENTION IF ANY OF THE FOLLOWING OCCUR:  Excessive nausea (feeling sick to your stomach) and/or vomiting.   Severe abdominal pain and distention (swelling).   Trouble swallowing.   Temperature over 101 F (37.8 C).   Rectal bleeding or vomiting of blood.    Continue over-the-counter Prevacid daily  Further recommendations to follow pending review of pathology report  Continue Linzess  daily for constipation

## 2013-10-24 NOTE — Interval H&P Note (Signed)
History and Physical Interval Note:  10/24/2013 9:21 AM  Kanya C Schilling  has presented today for surgery, with the diagnosis of EPIGASTRIC PAIN, DYSPHAGIA, CONSTIPATION  The various methods of treatment have been discussed with the patient and family. After consideration of risks, benefits and other options for treatment, the patient has consented to  Procedure(s) with comments: ESOPHAGOGASTRODUODENOSCOPY (EGD) (N/A) - 9:45 SAVORY DILATION (N/A) MALONEY DILATION (N/A) as a surgical intervention .  The patient's history has been reviewed, patient examined, no change in status, stable for surgery.  I have reviewed the patient's chart and labs.  Questions were answered to the patient's satisfaction.     Molly Maduro Faithanne Verret  Linzess has helped constipation. Take Prevacid 15 mg daily. No change. EGD with dilation per plan. The risks, benefits, limitations, alternatives and imponderables have been reviewed with the patient. Potential for esophageal dilation, biopsy, etc. have also been reviewed.  Questions have been answered. All parties agreeable.

## 2013-10-27 ENCOUNTER — Encounter (HOSPITAL_COMMUNITY): Payer: Self-pay | Admitting: Internal Medicine

## 2013-10-27 NOTE — Progress Notes (Signed)
Labs from 09/12/2013 Total bilirubin 0.3, alkaline phosphatase 113, AST 18, ALT 19, albumin 3.7, hemoglobin 13, hematocrit 39, platelets 264,000, white blood cell count 8600, amylase 18, lipase less than 10, H. pylori 3.80, GGT 95 high, alkaline phosphatase isoenzymes showed alkaline phosphatase of 126, intestinal isoenzyme 0, bone isoenzymes 45, liver isoenzymes 55, macro hepatic isoenzyme 0

## 2013-10-27 NOTE — Progress Notes (Signed)
Quick Note:  Ok to wait for Dr. Luvenia Starch return. Patient may need to go to ID because she has failed H.pylori treatment multiple times. ______

## 2013-11-01 ENCOUNTER — Telehealth: Payer: Self-pay

## 2013-11-01 ENCOUNTER — Encounter: Payer: Self-pay | Admitting: Internal Medicine

## 2013-11-01 NOTE — Telephone Encounter (Signed)
Letter from: Corbin Adeourk, Robert M  Reason for Letter: Results Review Send letter to patient.  Send copy of letter with path to referring provider and PCP.   Almost certainly has a resistant organism. Refer for ID consultation for treatment.

## 2013-11-01 NOTE — Telephone Encounter (Signed)
Tried to call pt- LMOM 

## 2013-11-06 NOTE — Telephone Encounter (Signed)
I [facility] per patients request

## 2013-12-05 DIAGNOSIS — A048 Other specified bacterial intestinal infections: Secondary | ICD-10-CM | POA: Insufficient documentation

## 2013-12-18 ENCOUNTER — Ambulatory Visit (HOSPITAL_COMMUNITY): Payer: BC Managed Care – PPO

## 2014-02-12 ENCOUNTER — Other Ambulatory Visit (HOSPITAL_COMMUNITY): Payer: Self-pay | Admitting: Internal Medicine

## 2014-02-12 DIAGNOSIS — R109 Unspecified abdominal pain: Secondary | ICD-10-CM

## 2014-02-15 ENCOUNTER — Ambulatory Visit (HOSPITAL_COMMUNITY): Payer: BC Managed Care – PPO | Attending: Internal Medicine

## 2014-03-29 ENCOUNTER — Ambulatory Visit (HOSPITAL_COMMUNITY): Payer: BC Managed Care – PPO

## 2014-04-13 ENCOUNTER — Encounter (HOSPITAL_COMMUNITY): Payer: Self-pay | Admitting: *Deleted

## 2014-04-13 ENCOUNTER — Emergency Department (HOSPITAL_COMMUNITY)
Admission: EM | Admit: 2014-04-13 | Discharge: 2014-04-13 | Disposition: A | Discharge status: Home / Self Care | Payer: BLUE CROSS/BLUE SHIELD | Attending: Emergency Medicine | Admitting: Emergency Medicine

## 2014-04-13 DIAGNOSIS — J45909 Unspecified asthma, uncomplicated: Secondary | ICD-10-CM | POA: Diagnosis not present

## 2014-04-13 DIAGNOSIS — Y998 Other external cause status: Secondary | ICD-10-CM | POA: Insufficient documentation

## 2014-04-13 DIAGNOSIS — Z8619 Personal history of other infectious and parasitic diseases: Secondary | ICD-10-CM | POA: Diagnosis not present

## 2014-04-13 DIAGNOSIS — K219 Gastro-esophageal reflux disease without esophagitis: Secondary | ICD-10-CM | POA: Insufficient documentation

## 2014-04-13 DIAGNOSIS — Z91040 Latex allergy status: Secondary | ICD-10-CM | POA: Diagnosis not present

## 2014-04-13 DIAGNOSIS — Y9389 Activity, other specified: Secondary | ICD-10-CM | POA: Diagnosis not present

## 2014-04-13 DIAGNOSIS — Z791 Long term (current) use of non-steroidal anti-inflammatories (NSAID): Secondary | ICD-10-CM | POA: Insufficient documentation

## 2014-04-13 DIAGNOSIS — Y9289 Other specified places as the place of occurrence of the external cause: Secondary | ICD-10-CM | POA: Insufficient documentation

## 2014-04-13 DIAGNOSIS — L089 Local infection of the skin and subcutaneous tissue, unspecified: Secondary | ICD-10-CM

## 2014-04-13 DIAGNOSIS — S00452A Superficial foreign body of left ear, initial encounter: Secondary | ICD-10-CM | POA: Diagnosis not present

## 2014-04-13 DIAGNOSIS — Z79899 Other long term (current) drug therapy: Secondary | ICD-10-CM | POA: Diagnosis not present

## 2014-04-13 DIAGNOSIS — Z7982 Long term (current) use of aspirin: Secondary | ICD-10-CM | POA: Insufficient documentation

## 2014-04-13 DIAGNOSIS — G43909 Migraine, unspecified, not intractable, without status migrainosus: Secondary | ICD-10-CM | POA: Diagnosis not present

## 2014-04-13 DIAGNOSIS — H9202 Otalgia, left ear: Secondary | ICD-10-CM | POA: Diagnosis present

## 2014-04-13 DIAGNOSIS — X58XXXA Exposure to other specified factors, initial encounter: Secondary | ICD-10-CM | POA: Insufficient documentation

## 2014-04-13 DIAGNOSIS — G35 Multiple sclerosis: Secondary | ICD-10-CM | POA: Diagnosis not present

## 2014-04-13 MED ORDER — BACITRACIN-NEOMYCIN-POLYMYXIN 400-5-5000 EX OINT
TOPICAL_OINTMENT | Freq: Once | CUTANEOUS | Status: AC
  Administered 2014-04-13: 1 via TOPICAL
  Filled 2014-04-13: qty 1

## 2014-04-13 MED ORDER — SULFAMETHOXAZOLE-TRIMETHOPRIM 800-160 MG PO TABS: 1.0000 | ORAL_TABLET | Freq: Two times a day (BID) | ORAL | Status: AC

## 2014-04-13 MED ORDER — HYDROCODONE-ACETAMINOPHEN 5-325 MG PO TABS: 1.0000 | ORAL_TABLET | ORAL | Status: DC | PRN

## 2014-04-13 NOTE — Discharge Instructions (Signed)
Apply warm wet compresses to the area 3 times a day. Apply neosporin ointment. Take the antibiotics and pain medication as directed. Do not take the narcotic pain medication if you are driving as it will make you sleepy. Return for worsening symptoms.

## 2014-04-13 NOTE — ED Notes (Signed)
Swelling at site of lt mid ear piercing,

## 2014-04-13 NOTE — ED Notes (Signed)
Pt had lt upper ear pierced 2 weeks ago, started having pain, redness and swelling three days ago, green drainage.

## 2014-04-13 NOTE — ED Provider Notes (Signed)
CSN: [id number]     Arrival date & time 04/13/14  1135 History   First MD Initiated Contact with Patient 04/13/14 1154     Chief Complaint  Patient presents with  . Ear Problem     (Consider location/radiation/quality/duration/timing/severity/associated sxs/prior Treatment) HPI [name] is a 49 y.o. female who presents to the ED with left ear pain that started three days ago. She had the top of her ear pierced and now it is red, tender and has green drainage.   Past Medical History  Diagnosis Date  . Asthma   . MS (multiple sclerosis)   . Migraine headache   . GERD (gastroesophageal reflux disease)   . Chronic neck pain     C4-6 fusion  . Sleep apnea   . Helicobacter pylori gastritis 2011  . Fatty liver   . MIGRAINE, COMMON 05/14/2009    Qualifier: Diagnosis of  By: Ricard Dillon    . CONSTIPATION 05/16/2009    Qualifier: Diagnosis of  By: Yetta Barre FNP-BC, Kandice L   . LIVER FUNCTION TESTS, ABNORMAL, HX OF 05/14/2009    Qualifier: Diagnosis of  By: Ricard Dillon    . Dysphagia 02/23/2012   Past Surgical History  Procedure Laterality Date  . Neck surgery      X2, last time 2013  . Appendectomy    . Cholecystectomy    . Abdominal hysterectomy    . Mandible surgery    . Colonoscopy  07/09/2009    RMR; normal rectum aside from anal canal hemorrhoids/scattered left-sided diverticula  . Esophagogastroduodenoscopy  05/22/2009    RMR; normal /small HH  . Esophagogastroduodenoscopy  2007    RMR: non-critical Schatzki's rin, non-maniuplated  . Back surgery  03/07/2012    Nerve Stimulator  . Esophagogastroduodenoscopy (egd) with esophageal dilation  03/17/2012    WUJ:[id number] appearing esophageal mucosa of uncertain significance. Status post [name] dilation followed by esophageal bx  . Esophagogastroduodenoscopy N/A 10/24/2013    Procedure: ESOPHAGOGASTRODUODENOSCOPY (EGD);  Surgeon: Corbin Ade, MD;  Location: [facility];  Service: Endoscopy;  Laterality: N/A;  9:45    . Maloney dilation N/A 10/24/2013    Procedure: [name] DILATION;  Surgeon: Corbin Ade, MD;  Location: [facility] ENDO SUITE;  Service: Endoscopy;  Laterality: N/A;   Family History  Problem Relation Age of Onset  . Colon cancer Neg Hx   . Cancer Mother     unknown type  . Diabetes Father   . Diabetes Sister    History  Substance Use Topics  . Smoking status: Never Smoker   . Smokeless tobacco: Not on file  . Alcohol Use: No   OB History    No data available     Review of Systems Negative except as stated in HPI  Allergies  Peanut-containing drug products; Latex; Prednisone; and Nexium  Home Medications   Prior to Admission medications   Medication Sig Start Date End Date Taking? Authorizing Provider  albuterol (PROVENTIL HFA;VENTOLIN HFA) 108 (90 BASE) MCG/ACT inhaler Inhale 2 puffs into the lungs every 6 (six) hours as needed. For Asthma    Historical Provider, MD  amitriptyline (ELAVIL) 100 MG tablet Take 100 mg by mouth at bedtime.    Historical Provider, MD  aspirin EC 81 MG EC tablet Take 1 tablet (81 mg total) by mouth daily. 04/17/12   Erick Blinks, MD  baclofen (LIORESAL) 10 MG tablet Take 10 mg by mouth 3 (three) times daily.     Historical Provider, MD  citalopram (  CELEXA) 40 MG tablet Take 40 mg by mouth daily.     Historical Provider, MD  COPAXONE 40 MG/ML SOSY Inject into the muscle 3 (three) times a week. 10/13/13   Historical Provider, MD  diazepam (VALIUM) 5 MG tablet Take 5 mg by mouth every 12 (twelve) hours as needed for muscle spasms.    Historical Provider, MD  diltiazem (CARDIZEM CD) 120 MG 24 hr capsule Take 1 capsule (120 mg total) by mouth daily. 04/17/12   Erick Blinks, MD  gabapentin (NEURONTIN) 300 MG capsule Take 600 mg by mouth 3 (three) times daily.     Historical Provider, MD  HYDROcodone-acetaminophen (NORCO/VICODIN) 5-325 MG per tablet Take 1 tablet by mouth every 4 (four) hours as needed. 04/13/14   Ranjit Ashurst Orlene Och, NP  Linaclotide (LINZESS) 145  MCG CAPS capsule Take 1 capsule (145 mcg total) by mouth daily. On empty stomach for constipation 10/19/13   Tiffany Kocher, PA-C  meloxicam (MOBIC) 15 MG tablet Take 7.5 mg by mouth 2 (two) times daily.  10/10/13   Historical Provider, MD  oxybutynin (DITROPAN) 5 MG tablet Take 5 mg by mouth 2 (two) times daily.      Historical Provider, MD  pantoprazole (PROTONIX) 40 MG tablet Take 1 tablet (40 mg total) by mouth daily. 10/19/13   Tiffany Kocher, PA-C  sulfamethoxazole-trimethoprim (BACTRIM DS,SEPTRA DS) 800-160 MG per tablet Take 1 tablet by mouth 2 (two) times daily. 04/13/14 04/20/14  Shaylan Tutton Orlene Och, NP  topiramate (TOPAMAX) 100 MG tablet Take 100-200 mg by mouth 2 (two) times daily. 100mg  in am, 200mg  in pm 09/14/13   Historical Provider, MD   BP 126/67 mmHg  Pulse 109  Temp(Src) 98.3 F (36.8 C) (Oral)  Resp 20  Ht 5\' 5"  (1.651 m)  Wt 235 lb 3 oz (106.68 kg)  BMI 39.14 kg/m2  SpO2 98% Physical Exam  Constitutional: She is oriented to person, place, and time. She appears well-developed and well-nourished. No distress.  HENT:  Head: Normocephalic.  Ears:  Left external ear with piercing through the helix. The ball of the ear ring is inside the skin and the back of the ear ring is on the outside at the back of the ear. There is crusting and yellow drainage on the back of the ear. There is erythema and tenderness on exam   Eyes: Conjunctivae and EOM are normal.  Neck: Neck supple.  Pulmonary/Chest: Effort normal.  Abdominal: Soft. There is no tenderness.  Musculoskeletal: Normal range of motion.  Neurological: She is alert and oriented to person, place, and time. No cranial nerve deficit.  Skin: Skin is warm and dry.  Psychiatric: She has a normal mood and affect.  Nursing note and vitals reviewed.   ED Course  Procedures (including critical care time) While taking the back off the ear ring the front ball of the ear ring came through the back of the ear. Area cleaned, bacitracin  ointment applied.   Labs Review MDM  49 y.o. female with infection of the external left ear secondary to ear piercing and foreign body to the skin. Stable for d/c without fever or systemic infection. Will treat with antibiotics and pain medication, she will clean the area with antibacterial soap and use neosporin. Discussed with the patient and all questioned fully answered. She will return if any problems arise.    Final diagnoses:  Superficial foreign body ear without major open wound with infection, left, initial encounter      [facility]  Orlene Och, NP 04/13/14 1728  Benny Lennert, MD 04/16/14 [id number]

## 2014-05-15 ENCOUNTER — Encounter: Payer: Self-pay | Admitting: Physical Medicine & Rehabilitation

## 2014-05-28 ENCOUNTER — Ambulatory Visit: Payer: BLUE CROSS/BLUE SHIELD | Admitting: Physical Medicine & Rehabilitation

## 2014-06-08 ENCOUNTER — Ambulatory Visit: Payer: BLUE CROSS/BLUE SHIELD | Admitting: Physical Medicine & Rehabilitation

## 2014-06-26 ENCOUNTER — Emergency Department (HOSPITAL_COMMUNITY)
Admission: EM | Admit: 2014-06-26 | Discharge: 2014-06-26 | Disposition: A | Discharge status: Home / Self Care | Payer: BLUE CROSS/BLUE SHIELD | Attending: Emergency Medicine | Admitting: Emergency Medicine

## 2014-06-26 ENCOUNTER — Encounter (HOSPITAL_COMMUNITY): Payer: Self-pay | Admitting: Emergency Medicine

## 2014-06-26 ENCOUNTER — Emergency Department (HOSPITAL_COMMUNITY): Payer: BLUE CROSS/BLUE SHIELD

## 2014-06-26 DIAGNOSIS — Z8669 Personal history of other diseases of the nervous system and sense organs: Secondary | ICD-10-CM | POA: Diagnosis not present

## 2014-06-26 DIAGNOSIS — Z9889 Other specified postprocedural states: Secondary | ICD-10-CM | POA: Insufficient documentation

## 2014-06-26 DIAGNOSIS — Z7982 Long term (current) use of aspirin: Secondary | ICD-10-CM | POA: Diagnosis not present

## 2014-06-26 DIAGNOSIS — J45909 Unspecified asthma, uncomplicated: Secondary | ICD-10-CM | POA: Diagnosis not present

## 2014-06-26 DIAGNOSIS — Z8679 Personal history of other diseases of the circulatory system: Secondary | ICD-10-CM | POA: Insufficient documentation

## 2014-06-26 DIAGNOSIS — K219 Gastro-esophageal reflux disease without esophagitis: Secondary | ICD-10-CM | POA: Insufficient documentation

## 2014-06-26 DIAGNOSIS — Z9849 Cataract extraction status, unspecified eye: Secondary | ICD-10-CM | POA: Diagnosis not present

## 2014-06-26 DIAGNOSIS — Z79899 Other long term (current) drug therapy: Secondary | ICD-10-CM | POA: Diagnosis not present

## 2014-06-26 DIAGNOSIS — Z90710 Acquired absence of both cervix and uterus: Secondary | ICD-10-CM | POA: Diagnosis not present

## 2014-06-26 DIAGNOSIS — G8929 Other chronic pain: Secondary | ICD-10-CM | POA: Insufficient documentation

## 2014-06-26 DIAGNOSIS — R1010 Upper abdominal pain, unspecified: Secondary | ICD-10-CM | POA: Diagnosis not present

## 2014-06-26 DIAGNOSIS — R110 Nausea: Secondary | ICD-10-CM

## 2014-06-26 DIAGNOSIS — R109 Unspecified abdominal pain: Secondary | ICD-10-CM

## 2014-06-26 DIAGNOSIS — Z91040 Latex allergy status: Secondary | ICD-10-CM | POA: Diagnosis not present

## 2014-06-26 DIAGNOSIS — Z791 Long term (current) use of non-steroidal anti-inflammatories (NSAID): Secondary | ICD-10-CM | POA: Insufficient documentation

## 2014-06-26 HISTORY — DX: Other chronic pain: G89.29

## 2014-06-26 HISTORY — DX: Unspecified abdominal pain: R10.9

## 2014-06-26 LAB — URINALYSIS, ROUTINE W REFLEX MICROSCOPIC
Bilirubin Urine: NEGATIVE
GLUCOSE, UA: NEGATIVE mg/dL
Hgb urine dipstick: NEGATIVE
Ketones, ur: NEGATIVE mg/dL
Leukocytes, UA: NEGATIVE
Nitrite: NEGATIVE
Protein, ur: NEGATIVE mg/dL
Specific Gravity, Urine: 1.01 (ref 1.005–1.030)
Urobilinogen, UA: 1 mg/dL (ref 0.0–1.0)
pH: 6.5 (ref 5.0–8.0)

## 2014-06-26 LAB — CBC WITH DIFFERENTIAL/PLATELET
BASOS ABS: 0.1 10*3/uL (ref 0.0–0.1)
BASOS PCT: 1 % (ref 0–1)
EOS PCT: 4 % (ref 0–5)
Eosinophils Absolute: 0.3 10*3/uL (ref 0.0–0.7)
HCT: 41.9 % (ref 36.0–46.0)
Hemoglobin: 13.8 g/dL (ref 12.0–15.0)
LYMPHS ABS: 2.7 10*3/uL (ref 0.7–4.0)
LYMPHS PCT: 33 % (ref 12–46)
MCH: 29.6 pg (ref 26.0–34.0)
MCHC: 32.9 g/dL (ref 30.0–36.0)
MCV: 89.7 fL (ref 78.0–100.0)
MONO ABS: 0.6 10*3/uL (ref 0.1–1.0)
Monocytes Relative: 7 % (ref 3–12)
Neutro Abs: 4.6 10*3/uL (ref 1.7–7.7)
Neutrophils Relative %: 55 % (ref 43–77)
PLATELETS: 255 10*3/uL (ref 150–400)
RBC: 4.67 MIL/uL (ref 3.87–5.11)
RDW: 12.9 % (ref 11.5–15.5)
WBC: 8.3 10*3/uL (ref 4.0–10.5)

## 2014-06-26 LAB — COMPREHENSIVE METABOLIC PANEL
ALBUMIN: 3.6 g/dL (ref 3.5–5.2)
ALT: 33 U/L (ref 0–35)
ANION GAP: 8 (ref 5–15)
AST: 30 U/L (ref 0–37)
Alkaline Phosphatase: 128 U/L — ABNORMAL HIGH (ref 39–117)
BILIRUBIN TOTAL: 0.4 mg/dL (ref 0.3–1.2)
BUN: 11 mg/dL (ref 6–23)
CALCIUM: 8.7 mg/dL (ref 8.4–10.5)
CO2: 32 mmol/L (ref 19–32)
Chloride: 95 mmol/L — ABNORMAL LOW (ref 96–112)
Creatinine, Ser: 0.77 mg/dL (ref 0.50–1.10)
GFR calc non Af Amer: >90 mL/min (ref >90)
GLUCOSE: 146 mg/dL — AB (ref 70–99)
Potassium: 3.9 mmol/L (ref 3.5–5.1)
Sodium: 135 mmol/L (ref 135–145)
Total Protein: 8.4 g/dL — ABNORMAL HIGH (ref 6.0–8.3)

## 2014-06-26 LAB — LIPASE, BLOOD: Lipase: 13 U/L (ref 11–59)

## 2014-06-26 MED ORDER — ONDANSETRON 8 MG PO TBDP
8.0000 mg | ORAL_TABLET | Freq: Once | ORAL | Status: AC
  Administered 2014-06-26: 8 mg via ORAL
  Filled 2014-06-26: qty 1

## 2014-06-26 MED ORDER — SODIUM CHLORIDE 0.9 % IV BOLUS (SEPSIS)
1000.0000 mL | Freq: Once | INTRAVENOUS | Status: AC
  Administered 2014-06-26: 1000 mL via INTRAVENOUS

## 2014-06-26 MED ORDER — FENTANYL CITRATE 0.05 MG/ML IJ SOLN
50.0000 ug | INTRAMUSCULAR | Status: DC | PRN
  Administered 2014-06-26: 50 ug via INTRAVENOUS
  Filled 2014-06-26: qty 2

## 2014-06-26 MED ORDER — PROMETHAZINE HCL 25 MG RE SUPP: 25.0000 mg | Freq: Four times a day (QID) | RECTAL | Status: DC | PRN

## 2014-06-26 MED ORDER — FENTANYL CITRATE 0.05 MG/ML IJ SOLN: 100.0000 ug | Freq: Once | INTRAMUSCULAR | Status: DC

## 2014-06-26 MED ORDER — PROMETHAZINE HCL 25 MG/ML IJ SOLN
25.0000 mg | Freq: Once | INTRAMUSCULAR | Status: AC
  Administered 2014-06-26: 25 mg via INTRAMUSCULAR
  Filled 2014-06-26 (×2): qty 1

## 2014-06-26 NOTE — ED Notes (Signed)
Pt. Sleeping when RN entered the room. Pt. Awoken and ambulated to nursing station and back to room without difficulty. EDP notified.

## 2014-06-26 NOTE — ED Notes (Signed)
When standing the patient became dizzy.

## 2014-06-26 NOTE — ED Notes (Signed)
Pt presents to ED c/o abdominal pain and cramping, nausea, vomiting, poor appetite, and diarrhea.  She states that she was first diagnosed with H. Pylori infection approximately two years ago and has been treated by infectious disease specialists at [facility] and the [facility], even recently having a gastric biopsy in February 2016.  She has not yet received the results of that biopsy.  She has been taking Tylenol for pain relief but it has not been effective.  Carbonated beverages and saltines do not help with the nausea either.  She has had a poor appetite and has been unable to keep her food down for about the past month.  She can only eat a few bites of oatmeal at a time and even then gets nauseated and often vomits.  She has lost about five pounds this month.  She reports bloating and pain rated at 10/10 which is described as sharp, cramping, and nauseating.

## 2014-06-26 NOTE — ED Provider Notes (Signed)
CSN: [id number]     Arrival date & time 06/26/14  1559 History   First MD Initiated Contact with Patient 06/26/14 1731     Chief Complaint  Patient presents with  . Abdominal Pain      HPI Pt was seen at 1740.  Per pt, c/o gradual onset and persistence of constant acute flair of her chronic upper abd "pain" for the past 2 years, worse over the past 1 year. Has been associated with multiple intermittent episodes of N/V.  Describes the abd pain as "cramping." Pt has been taking zofran without relief. Pt has been extensively evaluated for these complaints by her local GI Dr. Jena Gauss, [facility] GI MD without definitive cause.  Denies diarrhea, no fevers, no back pain, no rash, no CP/SOB, no black or blood in stools or emesis. The symptoms have been associated with no other complaints. The patient has a significant history of similar symptoms previously, recently being evaluated for this complaint and multiple prior evals for same.      Past Medical History  Diagnosis Date  . Asthma   . MS (multiple sclerosis)   . Migraine headache   . GERD (gastroesophageal reflux disease)   . Chronic neck pain     C4-6 fusion  . Sleep apnea   . Helicobacter pylori gastritis 2011  . Fatty liver   . MIGRAINE, COMMON 05/14/2009    Qualifier: Diagnosis of  By: Ricard Dillon    . CONSTIPATION 05/16/2009    Qualifier: Diagnosis of  By: Yetta Barre FNP-BC, Kandice L   . LIVER FUNCTION TESTS, ABNORMAL, HX OF 05/14/2009    Qualifier: Diagnosis of  By: Ricard Dillon    . Dysphagia 02/23/2012  . Chronic abdominal pain    Past Surgical History  Procedure Laterality Date  . Neck surgery      X2, last time 2013  . Appendectomy    . Cholecystectomy    . Abdominal hysterectomy    . Mandible surgery    . Colonoscopy  07/09/2009    RMR; normal rectum aside from anal canal hemorrhoids/scattered left-sided diverticula  . Esophagogastroduodenoscopy  05/22/2009    RMR; normal /small HH  . Esophagogastroduodenoscopy   2007    RMR: non-critical Schatzki's rin, non-maniuplated  . Back surgery  03/07/2012    Nerve Stimulator  . Esophagogastroduodenoscopy (egd) with esophageal dilation  03/17/2012    WUJ:[id number] appearing esophageal mucosa of uncertain significance. Status post [name] dilation followed by esophageal bx  . Esophagogastroduodenoscopy N/A 10/24/2013    Procedure: ESOPHAGOGASTRODUODENOSCOPY (EGD);  Surgeon: Corbin Ade, MD;  Location: [facility];  Service: Endoscopy;  Laterality: N/A;  9:45  . Maloney dilation N/A 10/24/2013    Procedure: [name] DILATION;  Surgeon: Corbin Ade, MD;  Location: [facility];  Service: Endoscopy;  Laterality: N/A;   Family History  Problem Relation Age of Onset  . Colon cancer Neg Hx   . Cancer Mother     unknown type  . Diabetes Father   . Diabetes Sister    History  Substance Use Topics  . Smoking status: Never Smoker   . Smokeless tobacco: Not on file  . Alcohol Use: No   OB History    Gravida Para Term Preterm AB TAB SAB Ectopic Multiple Living   5 5 5             Review of Systems ROS: Statement: All systems negative except as marked or noted in the HPI; Constitutional: Negative for fever and chills. ; ;  Eyes: Negative for eye pain, redness and discharge. ; ; ENMT: Negative for ear pain, hoarseness, nasal congestion, sinus pressure and sore throat. ; ; Cardiovascular: Negative for chest pain, palpitations, diaphoresis, dyspnea and peripheral edema. ; ; Respiratory: Negative for cough, wheezing and stridor. ; ; Gastrointestinal: +N/V, abd pain. Negative for diarrhea, blood in stool, hematemesis, jaundice and rectal bleeding. . ; ; Genitourinary: Negative for dysuria, flank pain and hematuria. ; ; Musculoskeletal: Negative for back pain and neck pain. Negative for swelling and trauma.; ; Skin: Negative for pruritus, rash, abrasions, blisters, bruising and skin lesion.; ; Neuro: Negative for headache, lightheadedness and neck stiffness. Negative for  weakness, altered level of consciousness , altered mental status, extremity weakness, paresthesias, involuntary movement, seizure and syncope.      Allergies  Peanut-containing drug products; Latex; Prednisone; Nexium; and Tape  Home Medications   Prior to Admission medications   Medication Sig Start Date End Date Taking? Authorizing Provider  albuterol (PROVENTIL HFA;VENTOLIN HFA) 108 (90 BASE) MCG/ACT inhaler Inhale 2 puffs into the lungs every 6 (six) hours as needed. For Asthma   Yes Historical Provider, MD  amitriptyline (ELAVIL) 100 MG tablet Take 100 mg by mouth at bedtime.   Yes Historical Provider, MD  baclofen (LIORESAL) 20 MG tablet Take 20 mg by mouth 3 (three) times daily as needed. 05/08/14  Yes Historical Provider, MD  citalopram (CELEXA) 40 MG tablet Take 40 mg by mouth every evening.    Yes Historical Provider, MD  diazepam (VALIUM) 2 MG tablet Take 2 mg by mouth daily as needed for muscle spasms (**Takes only if Baclofen is ineffective**). TAKE 1 TABLET BY MOUTH DAILY AS NEEDED FOR SPASMS 06/13/13  Yes Historical Provider, MD  diltiazem (CARDIZEM CD) 120 MG 24 hr capsule Take 1 capsule (120 mg total) by mouth daily. 04/17/12  Yes Erick Blinks, MD  gabapentin (NEURONTIN) 300 MG capsule Take 600 mg by mouth 3 (three) times daily.    Yes Historical Provider, MD  HYDROcodone-acetaminophen (NORCO) 10-325 MG per tablet Take 1 tablet by mouth every 4 (four) hours as needed. 05/09/14  Yes Historical Provider, MD  Linaclotide Karlene Einstein) 145 MCG CAPS capsule Take 1 capsule (145 mcg total) by mouth daily. On empty stomach for constipation 10/19/13  Yes Tiffany Kocher, PA-C  meloxicam (MOBIC) 15 MG tablet Take 7.5 mg by mouth 2 (two) times daily.  10/10/13  Yes Historical Provider, MD  ondansetron (ZOFRAN) 4 MG tablet Take 4 mg by mouth every 8 (eight) hours as needed for nausea or vomiting.  06/20/14 06/27/14 Yes Historical Provider, MD  pantoprazole (PROTONIX) 40 MG tablet Take 1 tablet (40 mg  total) by mouth daily. 10/19/13  Yes Tiffany Kocher, PA-C  aspirin EC 81 MG EC tablet Take 1 tablet (81 mg total) by mouth daily. Patient not taking: Reported on 06/26/2014 04/17/12   Erick Blinks, MD  COPAXONE 40 MG/ML SOSY Inject into the muscle 3 (three) times a week. 10/13/13   Historical Provider, MD   BP 135/88 mmHg  Pulse 105  Temp(Src) 99.6 F (37.6 C) (Oral)  Resp 18  Ht 5\' 5"  (1.651 m)  Wt 230 lb (104.327 kg)  BMI 38.27 kg/m2  SpO2 100% Physical Exam  1745: Physical examination:  Nursing notes reviewed; Vital signs and O2 SAT reviewed;  Constitutional: Well developed, Well nourished, Well hydrated, In no acute distress; Head:  Normocephalic, atraumatic; Eyes: EOMI, PERRL, No scleral icterus; ENMT: Mouth and pharynx normal, Mucous membranes moist; Neck: Supple, Full  range of motion, No lymphadenopathy; Cardiovascular: Regular rate and rhythm, No murmur, rub, or gallop; Respiratory: Breath sounds clear & equal bilaterally, No rales, rhonchi, wheezes.  Speaking full sentences with ease, Normal respiratory effort/excursion; Chest: Nontender, Movement normal; Abdomen: Soft, +mid-epigastric tenderness to palp. No rebound or guarding. Nondistended, Normal bowel sounds; Genitourinary: No CVA tenderness; Extremities: Pulses normal, No tenderness, No edema, No calf edema or asymmetry.; Neuro: AA&Ox3, Major CN grossly intact.  Speech clear. No gross focal motor or sensory deficits in extremities. Climbs on and off stretcher easily by herself. Gait steady.; Skin: Color normal, Warm, Dry.; Psych:  Affect flat, poor eye contact.    ED Course  Procedures     EKG Interpretation None      MDM  MDM Reviewed: previous chart, nursing note and vitals Reviewed previous: labs Interpretation: labs and x-ray      Results for orders placed or performed during the hospital encounter of 06/26/14  CBC with Differential  Result Value Ref Range   WBC 8.3 4.0 - 10.5 K/uL   RBC 4.67 3.87 - 5.11  MIL/uL   Hemoglobin 13.8 12.0 - 15.0 g/dL   HCT 16.1 09.6 - 04.5 %   MCV 89.7 78.0 - 100.0 fL   MCH 29.6 26.0 - 34.0 pg   MCHC 32.9 30.0 - 36.0 g/dL   RDW 40.9 81.1 - 91.4 %   Platelets 255 150 - 400 K/uL   Neutrophils Relative % 55 43 - 77 %   Neutro Abs 4.6 1.7 - 7.7 K/uL   Lymphocytes Relative 33 12 - 46 %   Lymphs Abs 2.7 0.7 - 4.0 K/uL   Monocytes Relative 7 3 - 12 %   Monocytes Absolute 0.6 0.1 - 1.0 K/uL   Eosinophils Relative 4 0 - 5 %   Eosinophils Absolute 0.3 0.0 - 0.7 K/uL   Basophils Relative 1 0 - 1 %   Basophils Absolute 0.1 0.0 - 0.1 K/uL  Comprehensive metabolic panel  Result Value Ref Range   Sodium 135 135 - 145 mmol/L   Potassium 3.9 3.5 - 5.1 mmol/L   Chloride 95 (L) 96 - 112 mmol/L   CO2 32 19 - 32 mmol/L   Glucose, Bld 146 (H) 70 - 99 mg/dL   BUN 11 6 - 23 mg/dL   Creatinine, Ser 7.82 0.50 - 1.10 mg/dL   Calcium 8.7 8.4 - 95.6 mg/dL   Total Protein 8.4 (H) 6.0 - 8.3 g/dL   Albumin 3.6 3.5 - 5.2 g/dL   AST 30 0 - 37 U/L   ALT 33 0 - 35 U/L   Alkaline Phosphatase 128 (H) 39 - 117 U/L   Total Bilirubin 0.4 0.3 - 1.2 mg/dL   GFR calc non Af Amer >90 >90 mL/min   GFR calc Af Amer >90 >90 mL/min   Anion gap 8 5 - 15  Urinalysis, Routine w reflex microscopic  Result Value Ref Range   Color, Urine YELLOW YELLOW   APPearance CLEAR CLEAR   Specific Gravity, Urine 1.010 1.005 - 1.030   pH 6.5 5.0 - 8.0   Glucose, UA NEGATIVE NEGATIVE mg/dL   Hgb urine dipstick NEGATIVE NEGATIVE   Bilirubin Urine NEGATIVE NEGATIVE   Ketones, ur NEGATIVE NEGATIVE mg/dL   Protein, ur NEGATIVE NEGATIVE mg/dL   Urobilinogen, UA 1.0 0.0 - 1.0 mg/dL   Nitrite NEGATIVE NEGATIVE   Leukocytes, UA NEGATIVE NEGATIVE  Lipase, blood  Result Value Ref Range   Lipase 13 11 - 59 U/L  Dg Abd Acute W/chest 06/26/2014   CLINICAL DATA:  Abdominal pain and cramping, nausea, vomiting, poor appetite and diarrhea for 1 month.  EXAM: ACUTE ABDOMEN SERIES (ABDOMEN 2 VIEW & CHEST 1 VIEW)   COMPARISON:  None.  FINDINGS: The cardiomediastinal contours are normal. The lungs are clear. There is no free intra-abdominal air. No dilated bowel loops to suggest obstruction. Moderate volume of stool throughout the entire colon. No radiopaque calculi. Surgical clips in the right upper quadrant from cholecystectomy. Spinal stimulator in place, tips posterior to T7 and T8. No acute osseous abnormalities are seen. Surgical hardware in the lower cervical spine, partially included.  IMPRESSION: Normal bowel gas pattern.  Clear lungs.   Electronically Signed   By: Rubye Oaks M.D.   On: 06/26/2014 19:08    2050:  Pt has tol PO well while in the ED without N/V.  No stooling while in the ED. VSS. IVF bolus given for lightheadedness during orthostatic VS. Pt now has climbed on and off the stretcher easily, and ambulated around the ED with steady gait, easy resps, NAD.  Denies further lightheadedness. States she feels better and wants to go home now. Long hx of chronic abd pain and N/V with multiple ED visits for same.  Pt endorses acute flair of her usual long standing chronic symptoms today, no change from her usual chronic symptoms pattern.  Pt encouraged to f/u with her PMD and GI MD for good continuity of care and control of her chronic symptoms.  Verb understanding. Dx and testing d/w pt and family.  Questions answered.  Verb understanding, agreeable to d/c home with outpt f/u.    Samuel Jester, DO 06/28/14 1351

## 2014-06-26 NOTE — Discharge Instructions (Signed)
°Emergency Department Resource Guide °1) Find a Doctor and Pay Out of Pocket °Although you won't have to find out who is covered by your insurance plan, it is a good idea to ask around and get recommendations. You will then need to call the office and see if the doctor you have chosen will accept you as a new patient and what types of options they offer for patients who are self-pay. Some doctors offer discounts or will set up payment plans for their patients who do not have insurance, but you will need to ask so you aren't surprised when you get to your appointment. ° °2) Contact Your Local Health Department °Not all health departments have doctors that can see patients for sick visits, but many do, so it is worth a call to see if yours does. If you don't know where your local health department is, you can check in your phone book. The CDC also has a tool to help you locate your state's health department, and many state websites also have listings of all of their local health departments. ° °3) Find a Walk-in Clinic °If your illness is not likely to be very severe or complicated, you may want to try a walk in clinic. These are popping up all over the country in pharmacies, drugstores, and shopping centers. They're usually staffed by nurse practitioners or physician assistants that have been trained to treat common illnesses and complaints. They're usually fairly quick and inexpensive. However, if you have serious medical issues or chronic medical problems, these are probably not your best option. ° °No Primary Care Doctor: °- Call [facility] at  832-8000 - they can help you locate a primary care doctor that  accepts your insurance, provides certain services, etc. °- Physician Referral Service- 1-800-533-3463 ° °Chronic Pain Problems: °Organization         Address  Phone   Notes  °[facility]  (336) 297-2271 Patients need to be referred by their primary care doctor.  ° °Medication  Assistance: °Organization         Address  Phone   Notes  °[place] [facility] [address] °[place], [place] [address] (336) 641-8030 --Must be a resident of [place] °-- Must have NO insurance coverage whatsoever (no Medicaid/ Medicare, etc.) °-- The pt. MUST have a primary care doctor that directs their care regularly and follows them in the community °  °[facility]  (866) 331-1348   °[employer]  (888) 892-1162   ° °Agencies that provide inexpensive medical care: °Organization         Address  Phone   Notes  °[facility]  (336) 832-8035   °[facility]    (336) 832-7272   °[facility] [address] °[place], [place] [address] (336) 832-4777   °[facility] [address], °[place] (336) 271-4999   °[facility]    (336) 373-0678   °[facility]    (336) 272-1050   °[facility] ° [address], [place] Phone:  (336) 832-4444, Fax:  (336) 832-4440 Hours of Operation:  9 am - 6 pm, M-F.  Also accepts Medicaid/Medicare and self-pay.  °[facility] ° [address], [place] Phone: (336) 832-3150, Fax: (336) 832-3151. Hours of Operation:  8:30 am - 5:30 pm, M-F.  Also accepts Medicaid and self-pay.  °[facility] [place] [address], [place] Phone: (336) 878-6027   °[facility] [address], [place], [place] (336)723-1848, Ext. 123 Mondays & Thursdays: 7-9 AM.  First 15 patients are seen on a first come, first serve basis. °  ° °Medicaid-accepting [place] Providers: ° °Organization         Address  Phone   Notes  °[facility] [address], [place] (336) 641-2100 Also accepts self-pay patients.  °[facility] [address], [place] ° (336) 856-9996   °[facility] [address], [place]  (336) 288-8857   °[facility] [address] [place] [address], [place] (336) 299-7000   °Veita Bland [address], [place]  ° (336) 373-1557 Only accepts [place] Access Medicaid patients after they have their name applied to their card.  ° °Self-Pay (no insurance) in [place]: ° °Organization         Address  Phone   Notes  °Sickle Cell Patients, [facility] [address], [place] (336) 832-1970   °[facility] [address], [place] (336) 832-4400   °[facility] ° [address], [place] (336) 992-4800   °[facility]/Dr. Osei-Bonsu ° [address] [place] [address], [place] or [address], [place] (336) 841-8500 Phone number for both [place] and [place] locations is the same.  °[facility] [address], [place] (336) 299-0000   °[facility] [address] [place] [address], [place] or [address] (336) 852-7530 °(336) 878-2260   °[facility] [address], [place] (336) 350-1642, phone; (336) 294-5005, fax Sees patients 1st and 3rd Saturday of every month.  Must not qualify for public or private insurance (i.e. Medicaid, Medicare, [place] Health Choice, Veterans' Benefits) • Household income should be no more than 200% of the poverty level •The clinic cannot treat you if you are pregnant or think you are pregnant • Sexually transmitted diseases are not treated at the clinic.  ° ° °Dental Care: °Organization         Address  Phone  Notes  °[place] [facility] [address], [place] (336) 641-6152 Accepts children up to age 21 who are enrolled in Medicaid or [place] Health Choice; pregnant women with a Medicaid card; and children who have applied for Medicaid or [place] Health Choice, but were declined, whose parents can pay a reduced fee at time of service.  °[place]  [facility] [place]  [address], [place] (336) 641-7733 Accepts children up to age 21 who are enrolled in Medicaid or [place] Health Choice; pregnant women with a Medicaid card; and children who have applied for Medicaid or [place] Health Choice, but were declined, whose parents can pay a reduced fee at time of service.  °[facility] ° [address], [place] (336) 641-4533 Patients are seen by appointment only. Walk-ins are not accepted. [facility] will see patients 18 years of age and older. °Monday - Tuesday (8am-5pm) °Most Wednesdays (8:30-5pm) °$30 per visit, cash only  °[facility] ° [address], [place] (336) 641-4533 Patients are seen by appointment only. Walk-ins are not accepted. [facility] will see patients 18 years of age and older. °One   Wednesday Evening (Monthly: Volunteer Based).  $30 per visit, cash only  °[facility]  (919) 537-3737 for adults; Children under age 4, call Graduate Pediatric Dentistry at (919) [phone]. Children aged 4-14, please call (919) [phone] to request a pediatric application. ° Dental services are provided in all areas of dental care including fillings, crowns and bridges, complete and partial dentures, implants, gum treatment, root canals, and extractions. Preventive care is also provided. Treatment is provided to both adults and children. °Patients are selected via a lottery and there is often a waiting list. °  °[facility] [address], °[place] ° (336) 763-8833 [email or web address] °  °[facility] [address], [place], [place] (336)723-1848, Ext. 123 Second and Fourth Thursday of each month, opens at 6:30 AM; Clinic ends at 9 AM.  Patients are seen on a first-come first-served basis, and a limited number are seen during each clinic.  ° °[facility] ° [address], [place], [place] (336) 723-7904    Eligibility Requirements °You must have lived in [place], or [place] counties for at least the last three months. °  You cannot be eligible for state or federal sponsored healthcare insurance, including Veterans Administration, Medicaid, or Medicare. °  You generally cannot be eligible for healthcare insurance through your employer.  °  How to apply: °Eligibility screenings are held every Tuesday and Wednesday afternoon from 1:00 pm until 4:00 pm. You do not need an appointment for the interview!  °[facility] [address], [place], [place] 336-631-2330   °[facility]  336-342-8273   °[facility]  336-703-3100   °[facility]  336-570-6415   ° °Behavioral Health Resources in the Community: °Intensive Outpatient Programs °Organization         Address  Phone  Notes  °[facility] [address], [place], [place] 336-878-6098   °[facility] [address], [place], [place] 336-832-9800   °[facility] [address], [place], [place] ° 336-882-2125   °[facility] [address],  °[place], [place] 1-800-853-5163 or 336-641-4981   °Substance Abuse Resources °Organization         Address  Phone  Notes  °[facility]  336-882-2125   °[facility]  336-784-9470   °[facility]  336-285-9073   °[facility]  336-845-3988   °Residential & Outpatient Substance Abuse Program  1-800-659-3381   °Psychological Services °Organization         Address  Phone  Notes  °[facility]  336- 832-9600   °[facility]  336- 378-7881   °[facility] [address], [place] 1-800-853-5163 or 336-641-4981   ° °Mobile Crisis Teams °Organization         Address  Phone  Notes  °[facility], Mobile Crisis Care Unit  1-877-626-1772   °Assertive °[facility] ° [address]  [place], [place] 336-834-9664   °Sharon DeEsch [address] °[place] 336-554-5454   ° °Self-Help/Support Groups °Organization         Address  Phone             Notes  °[facility]  - variety of support groups  336- 373-1402 Call for more information  °Narcotics Anonymous (NA), [facility] [address], °[place]  2 meetings at this location  ° °  Residential Treatment Programs Organization         Address  Phone  Notes  [facility] [address]    [place]  1-638-453-6468   [facility]  [address] [place] [address], [place], [place] 482-500-3704   [facility] [address] [place], [place] 888-916-9450 Admissions: 8am-3pm M-F  [facility] [address],    [place], [place] 388-828-0034   [facility] [address] [place], [place], [place] 917-915-0569   [facility] [address]  [place], [place] 794-801-6553   [facility] Intensive Outpatient [address], [place], [place] [phone]   [facility] ([facility] [address]  [place], [place] 5-449-201-0071 or (709)233-9337   [facility] ([facility] [address] [place], [place] 498-264-1583 Accepts Medicaid  [facility] [address],  [place] 0-940-768-0881 Substance Abuse/Addiction Treatment   [facility]         Address  Phone  Notes  [facility]  989-456-3135   Angie Fava, PhD [address] [place], [place]   5313613593 or 224-496-5230   [facility]   [address] [place], [place] 540-487-1181   [facility] [address], [place], [place] 917-377-9539 Insurance/Medicaid/sponsorship through [facility] [address]                                    [place], [place] [phone] Therapy/tele-psych/case    [facility] [address], [place] [phone]    Dr. Lolly Mustache  (707) [phone]   [facility]  [facility] Dept. 1) [address] [place] 2) [address], [place] 3)  [address], [place] [phone]  [phone]   [facility] Child Abuse Hotline (321)045-0626 or 819-107-6792 (After Hours)      Take the prescription as directed.  Increase your fluid intake (ie:  Gatoraide) for the next few days.  Eat a bland diet and advance to your regular diet slowly as you can tolerate it.  Call your regular medical and GI doctors tomorrow morning to schedule a follow up appointment this week.  Return to the Emergency Department immediately if not improving (or even worsening) despite taking the medicines as prescribed, any black or bloody stool or vomit, if you develop a fever over "101," or for any other concerns.

## 2014-06-26 NOTE — ED Notes (Signed)
Discharge instructions and prescription given and reviewed with patient.  Patient verbalized understanding to take medication as directed and to follow up with GI doctor at [facility].  Patient discharged home in good condition.

## 2014-06-26 NOTE — ED Notes (Signed)
Pt. Resting in bed watching tv and playing on cell phone. Pt. Given ginger ale.

## 2014-07-02 ENCOUNTER — Encounter: Payer: BLUE CROSS/BLUE SHIELD | Attending: Physical Medicine & Rehabilitation

## 2014-07-02 ENCOUNTER — Ambulatory Visit (HOSPITAL_BASED_OUTPATIENT_CLINIC_OR_DEPARTMENT_OTHER): Payer: BLUE CROSS/BLUE SHIELD | Admitting: Physical Medicine & Rehabilitation

## 2014-07-02 ENCOUNTER — Other Ambulatory Visit: Payer: Self-pay | Admitting: Physical Medicine & Rehabilitation

## 2014-07-02 ENCOUNTER — Encounter: Payer: Self-pay | Admitting: Physical Medicine & Rehabilitation

## 2014-07-02 VITALS — BP 164/98 | HR 112 | Resp 14

## 2014-07-02 DIAGNOSIS — M47814 Spondylosis without myelopathy or radiculopathy, thoracic region: Secondary | ICD-10-CM | POA: Diagnosis not present

## 2014-07-02 DIAGNOSIS — G8929 Other chronic pain: Secondary | ICD-10-CM

## 2014-07-02 DIAGNOSIS — M545 Low back pain: Secondary | ICD-10-CM | POA: Insufficient documentation

## 2014-07-02 DIAGNOSIS — Z79899 Other long term (current) drug therapy: Secondary | ICD-10-CM

## 2014-07-02 DIAGNOSIS — Z5181 Encounter for therapeutic drug level monitoring: Secondary | ICD-10-CM

## 2014-07-02 DIAGNOSIS — G894 Chronic pain syndrome: Secondary | ICD-10-CM | POA: Insufficient documentation

## 2014-07-02 HISTORY — DX: Spondylosis without myelopathy or radiculopathy, thoracic region: M47.814

## 2014-07-02 MED ORDER — MELOXICAM 7.5 MG PO TABS: 7.5000 mg | ORAL_TABLET | Freq: Every day | ORAL | Status: DC

## 2014-07-02 NOTE — Progress Notes (Signed)
Subjective:    Patient ID: [name], female    DOB: [birth year], 49 y.o.   MRN: [id number]  HPI  Chief complaint is mid back and low back pain of several years duration 49 year old female with several year history of low back pain that sometimes goes into her legs as well. Patient had treatment with physical therapy, massage therapy, trigger point injections, Patient had spinal cord stimulation trial and then implantation. After implantation patient had about 1 month pain relief but then it stopped working. She had follow-up with the anesthesiologist who place the stimulator, no improvements.  Patient is trying to avoid back surgery. She underwent C4-C5-C6 ACDF in August 2012. Postoperatively she "stopped breathing" for couple hours. She did have improvement with her neck pain after the surgery however  MRI of the thoracic spine demonstrated facet degenerative changes bilateral T6-T7. Mild disc bulges multilevel mid and lower thoracic no spinal stenosis. Lumbar spine from 2011 was normal  Pain Inventory Average Pain 9 Pain Right Now 9 My pain is constant, sharp, burning, stabbing and aching  In the last 24 hours, has pain interfered with the following? General activity 7 Relation with others 5 Enjoyment of life 5 What TIME of day is your pain at its worst? VARIES Sleep (in general) Poor  Pain is worse with: walking, bending, standing and some activites Pain improves with: medication Relief from Meds: 4  Mobility walk without assistance how many minutes can you walk? 5-10 ability to climb steps?  yes do you drive?  yes  Function disabled: date disabled .  Neuro/Psych bladder control problems numbness spasms depression anxiety  Prior Studies Any changes since last visit?  no  Physicians involved in your care Any changes since last visit?  no   Family History  Problem Relation Age of Onset  . Colon cancer Neg Hx   . Cancer Mother     unknown type  . Diabetes  Father   . Diabetes Sister    History   Social History  . Marital Status: Married    Spouse Name: N/A  . Number of Children: N/A  . Years of Education: N/A   Social History Main Topics  . Smoking status: Never Smoker   . Smokeless tobacco: Not on file  . Alcohol Use: No  . Drug Use: No  . Sexual Activity: Yes    Birth Control/ Protection: Surgical   Other Topics Concern  . None   Social History Narrative   5 kids, raising grandchild   Past Surgical History  Procedure Laterality Date  . Neck surgery      X2, last time 2013  . Appendectomy    . Cholecystectomy    . Abdominal hysterectomy    . Mandible surgery    . Colonoscopy  07/09/2009    RMR; normal rectum aside from anal canal hemorrhoids/scattered left-sided diverticula  . Esophagogastroduodenoscopy  05/22/2009    RMR; normal /small HH  . Esophagogastroduodenoscopy  2007    RMR: non-critical Schatzki's rin, non-maniuplated  . Back surgery  03/07/2012    Nerve Stimulator  . Esophagogastroduodenoscopy (egd) with esophageal dilation  03/17/2012    WUJ:WJXBJYNW appearing esophageal mucosa of uncertain significance. Status post [name] dilation followed by esophageal bx  . Esophagogastroduodenoscopy N/A 10/24/2013    Procedure: ESOPHAGOGASTRODUODENOSCOPY (EGD);  Surgeon: Corbin Ade, MD;  [facility];  Service: Endoscopy;  Laterality: N/A;  9:45  . Maloney dilation N/A 10/24/2013    Procedure: MALONEY DILATION;  Surgeon:  Corbin Ade, MD;  [facility];  Service: Endoscopy;  Laterality: N/A;   Past Medical History  Diagnosis Date  . Asthma   . MS (multiple sclerosis)   . Migraine headache   . GERD (gastroesophageal reflux disease)   . Chronic neck pain     C4-6 fusion  . Sleep apnea   . Helicobacter pylori gastritis 2011  . Fatty liver   . MIGRAINE, COMMON 05/14/2009    Qualifier: Diagnosis of  By: Ricard Dillon    . CONSTIPATION 05/16/2009    Qualifier: Diagnosis of  By: Yetta Barre  FNP-BC, Kandice L   . LIVER FUNCTION TESTS, ABNORMAL, HX OF 05/14/2009    Qualifier: Diagnosis of  By: Ricard Dillon    . Dysphagia 02/23/2012  . Chronic abdominal pain    BP 164/98 mmHg  Pulse 112  Resp 14  SpO2 96%  Opioid Risk Score:   Fall Risk Score: Low Fall Risk (0-5 points)`1  Depression screen PHQ 2/9  Depression screen PHQ 2/9 07/02/2014  Decreased Interest 3  Down, Depressed, Hopeless 1  PHQ - 2 Score 4  Altered sleeping 3  Tired, decreased energy 3  Change in appetite 3  Feeling bad or failure about yourself  3  Trouble concentrating 2  Moving slowly or fidgety/restless 2  Suicidal thoughts 0  PHQ-9 Score 20      Review of Systems  Constitutional: Positive for appetite change.  HENT: Negative.   Eyes: Negative.   Respiratory: Negative.   Cardiovascular: Negative.   Gastrointestinal: Positive for nausea.  Endocrine: Negative.   Genitourinary: Positive for difficulty urinating.  Musculoskeletal: Positive for back pain, arthralgias and neck pain.  Skin: Negative.   Allergic/Immunologic: Negative.   Neurological: Positive for numbness.       Spasms  Hematological: Negative.   Psychiatric/Behavioral: Positive for dysphoric mood. The patient is nervous/anxious.        Objective:   Physical Exam  Constitutional: She is oriented to person, place, and time. She appears well-developed and well-nourished.  Neurological: She is alert and oriented to person, place, and time.  Psychiatric: She has a normal mood and affect.  Nursing note and vitals reviewed.  Neuro:  Eyes without evidence of nystagmus  Tone is normal without evidence of spasticity Cerebellar exam shows no evidence of ataxia on finger nose finger or heel to shin testing No evidence of trunkal ataxia  Motor strength is 5/5 in bilateral deltoid, biceps, triceps, finger flexors and extensors, wrist flexors and extensors, hip flexors, knee flexors and extensors, ankle dorsiflexors, plantar  flexors, invertors and evertors, toe flexors and extensors  Sensory exam is normal to pinprick, proprioception and light touch in the upper and lower limbs   Negative SLR Tenderness to palp bilateral T7 paraspinals Tenderness with light palp Bilateral L5-S1 paraspinal         Assessment & Plan:  1. Chronic mid back pain with symptoms consistent with MRI findings of facet arthropathy T6-T7. We discussed treatment options including physical therapy as well as fluoroscopic guided thoracic facet injection versus medial branch blocks. Patient does not want to pursue interventional procedure at the current time, if she does we can make a referral to Dr. Alvester Morin  2. Chronic low back pain most recent MRI was in 2011 but that was normal, no red flags in terms of history, no neurologic deficits noted on physical exam. At this point it looks its most likely to be myofascial, we'll treat conservatively Mobitz 7.5 daily, referral  to physical therapy, now much better in a month may consider further imaging studies We discussed that based on her exam as well as radiologic imaging do not find an indication for narcotic analgesics at this time

## 2014-07-02 NOTE — Progress Notes (Deleted)
   Subjective:    Patient ID: [name], female    DOB: 1965-05-03, 49 y.o.   MRN: [id number]  HPI    Review of Systems  Constitutional: Positive for appetite change.       Poor appetite  HENT: Negative.   Eyes: Negative.   Respiratory: Negative.   Cardiovascular: Negative.   Gastrointestinal: Positive for nausea.  Endocrine: Negative.   Genitourinary: Positive for difficulty urinating.  Musculoskeletal: Positive for myalgias, back pain, arthralgias and neck pain.  Skin: Negative.   Allergic/Immunologic: Negative.   Neurological: Positive for numbness.       Spasms  Hematological: Negative.   Psychiatric/Behavioral: The patient is nervous/anxious.        Objective:   Physical Exam        Assessment & Plan:

## 2014-07-03 LAB — PMP ALCOHOL METABOLITE (ETG): Ethyl Glucuronide (EtG): NEGATIVE ng/mL

## 2014-07-07 LAB — PRESCRIPTION MONITORING PROFILE (SOLSTAS)
AMPHETAMINE/METH: NEGATIVE ng/mL
Barbiturate Screen, Urine: NEGATIVE ng/mL
Buprenorphine, Urine: NEGATIVE ng/mL
Cannabinoid Scrn, Ur: NEGATIVE ng/mL
Carisoprodol, Urine: NEGATIVE ng/mL
Cocaine Metabolites: NEGATIVE ng/mL
Creatinine, Urine: 490.52 mg/dL (ref >20.0)
Fentanyl, Ur: NEGATIVE ng/mL
MDMA URINE: NEGATIVE ng/mL
Meperidine, Ur: NEGATIVE ng/mL
Methadone Screen, Urine: NEGATIVE ng/mL
NITRITES URINE, INITIAL: NEGATIVE ug/mL
Oxycodone Screen, Ur: NEGATIVE ng/mL
PH URINE, INITIAL: 5.8 pH (ref 4.5–8.9)
PROPOXYPHENE: NEGATIVE ng/mL
Tapentadol, urine: NEGATIVE ng/mL
Tramadol Scrn, Ur: NEGATIVE ng/mL
ZOLPIDEM, URINE: NEGATIVE ng/mL

## 2014-07-07 LAB — OPIATES/OPIOIDS (LC/MS-MS)
CODEINE URINE: NEGATIVE ng/mL (ref <50)
HYDROCODONE: 875 ng/mL (ref <50)
Hydromorphone: 183 ng/mL (ref <50)
Morphine Urine: NEGATIVE ng/mL (ref <50)
NOROXYCODONE, UR: NEGATIVE ng/mL (ref <50)
Norhydrocodone, Ur: 2085 ng/mL (ref <50)
OXYMORPHONE, URINE: NEGATIVE ng/mL (ref <50)
Oxycodone, ur: NEGATIVE ng/mL (ref <50)

## 2014-07-07 LAB — BENZODIAZEPINES (GC/LC/MS), URINE
Alprazolam metabolite (GC/LC/MS), ur confirm: NEGATIVE ng/mL (ref <25)
Clonazepam metabolite (GC/LC/MS), ur confirm: NEGATIVE ng/mL (ref <25)
Flurazepam metabolite (GC/LC/MS), ur confirm: NEGATIVE ng/mL (ref <50)
Lorazepam (GC/LC/MS), ur confirm: NEGATIVE ng/mL (ref <50)
MIDAZOLAMU: NEGATIVE ng/mL (ref <50)
Nordiazepam (GC/LC/MS), ur confirm: NEGATIVE ng/mL (ref <50)
OXAZEPAMU: NEGATIVE ng/mL (ref <50)
Temazepam (GC/LC/MS), ur confirm: NEGATIVE ng/mL (ref <50)
Triazolam metabolite (GC/LC/MS), ur confirm: NEGATIVE ng/mL (ref <50)

## 2014-07-11 ENCOUNTER — Encounter (HOSPITAL_COMMUNITY): Payer: Self-pay | Admitting: *Deleted

## 2014-07-11 ENCOUNTER — Emergency Department (HOSPITAL_COMMUNITY)
Admission: EM | Admit: 2014-07-11 | Discharge: 2014-07-11 | Disposition: A | Discharge status: Home / Self Care | Payer: BLUE CROSS/BLUE SHIELD | Attending: Emergency Medicine | Admitting: Emergency Medicine

## 2014-07-11 ENCOUNTER — Emergency Department (HOSPITAL_COMMUNITY): Payer: BLUE CROSS/BLUE SHIELD

## 2014-07-11 DIAGNOSIS — Z8619 Personal history of other infectious and parasitic diseases: Secondary | ICD-10-CM | POA: Insufficient documentation

## 2014-07-11 DIAGNOSIS — Y9389 Activity, other specified: Secondary | ICD-10-CM | POA: Insufficient documentation

## 2014-07-11 DIAGNOSIS — K219 Gastro-esophageal reflux disease without esophagitis: Secondary | ICD-10-CM | POA: Diagnosis not present

## 2014-07-11 DIAGNOSIS — G43909 Migraine, unspecified, not intractable, without status migrainosus: Secondary | ICD-10-CM | POA: Diagnosis not present

## 2014-07-11 DIAGNOSIS — Z7982 Long term (current) use of aspirin: Secondary | ICD-10-CM | POA: Diagnosis not present

## 2014-07-11 DIAGNOSIS — Z792 Long term (current) use of antibiotics: Secondary | ICD-10-CM | POA: Insufficient documentation

## 2014-07-11 DIAGNOSIS — M25511 Pain in right shoulder: Secondary | ICD-10-CM | POA: Insufficient documentation

## 2014-07-11 DIAGNOSIS — X58XXXA Exposure to other specified factors, initial encounter: Secondary | ICD-10-CM | POA: Diagnosis not present

## 2014-07-11 DIAGNOSIS — Z791 Long term (current) use of non-steroidal anti-inflammatories (NSAID): Secondary | ICD-10-CM | POA: Diagnosis not present

## 2014-07-11 DIAGNOSIS — Z9889 Other specified postprocedural states: Secondary | ICD-10-CM | POA: Diagnosis not present

## 2014-07-11 DIAGNOSIS — Y998 Other external cause status: Secondary | ICD-10-CM | POA: Insufficient documentation

## 2014-07-11 DIAGNOSIS — J45909 Unspecified asthma, uncomplicated: Secondary | ICD-10-CM | POA: Diagnosis not present

## 2014-07-11 DIAGNOSIS — Y9289 Other specified places as the place of occurrence of the external cause: Secondary | ICD-10-CM | POA: Diagnosis not present

## 2014-07-11 DIAGNOSIS — S161XXA Strain of muscle, fascia and tendon at neck level, initial encounter: Secondary | ICD-10-CM | POA: Diagnosis not present

## 2014-07-11 DIAGNOSIS — Z91040 Latex allergy status: Secondary | ICD-10-CM | POA: Insufficient documentation

## 2014-07-11 DIAGNOSIS — G8929 Other chronic pain: Secondary | ICD-10-CM | POA: Insufficient documentation

## 2014-07-11 DIAGNOSIS — S199XXA Unspecified injury of neck, initial encounter: Secondary | ICD-10-CM | POA: Diagnosis present

## 2014-07-11 MED ORDER — OXYCODONE-ACETAMINOPHEN 5-325 MG PO TABS
1.0000 | ORAL_TABLET | Freq: Once | ORAL | Status: AC
  Administered 2014-07-11: 1 via ORAL
  Filled 2014-07-11: qty 1

## 2014-07-11 MED ORDER — OXYCODONE-ACETAMINOPHEN 5-325 MG PO TABS: 1.0000 | ORAL_TABLET | ORAL | Status: DC | PRN

## 2014-07-11 MED ORDER — METHOCARBAMOL 500 MG PO TABS: 500.0000 mg | ORAL_TABLET | Freq: Three times a day (TID) | ORAL | Status: DC

## 2014-07-11 NOTE — ED Notes (Signed)
T. Triplett, PA at bedside. 

## 2014-07-11 NOTE — ED Notes (Signed)
Swollen area to neck /shoulder, Increased pain with movement.  No injury known

## 2014-07-11 NOTE — Discharge Instructions (Signed)
Cervical Sprain A cervical sprain is when the tissues (ligaments) that hold the neck bones in place stretch or tear. HOME CARE   Put ice on the injured area.  Put ice in a plastic bag.  Place a towel between your skin and the bag.  Leave the ice on for 15-20 minutes, 3-4 times a day.  You may have been given a collar to wear. This collar keeps your neck from moving while you heal.  Do not take the collar off unless told by your doctor.  If you have long hair, keep it outside of the collar.  Ask your doctor before changing the position of your collar. You may need to change its position over time to make it more comfortable.  If you are allowed to take off the collar for cleaning or bathing, follow your doctor's instructions on how to do it safely.  Keep your collar clean by wiping it with mild soap and water. Dry it completely. If the collar has removable pads, remove them every 1-2 days to hand wash them with soap and water. Allow them to air dry. They should be dry before you wear them in the collar.  Do not drive while wearing the collar.  Only take medicine as told by your doctor.  Keep all doctor visits as told.  Keep all physical therapy visits as told.  Adjust your work station so that you have good posture while you work.  Avoid positions and activities that make your problems worse.  Warm up and stretch before being active. GET HELP IF:  Your pain is not controlled with medicine.  You cannot take less pain medicine over time as planned.  Your activity level does not improve as expected. GET HELP RIGHT AWAY IF:   You are bleeding.  Your stomach is upset.  You have an allergic reaction to your medicine.  You develop new problems that you cannot explain.  You lose feeling (become numb) or you cannot move any part of your body (paralysis).  You have tingling or weakness in any part of your body.  Your symptoms get worse. Symptoms include:  Pain,  soreness, stiffness, puffiness (swelling), or a burning feeling in your neck.  Pain when your neck is touched.  Shoulder or upper back pain.  Limited ability to move your neck.  Headache.  Dizziness.  Your hands or arms feel week, lose feeling, or tingle.  Muscle spasms.  Difficulty swallowing or chewing. MAKE SURE YOU:   Understand these instructions.  Will watch your condition.  Will get help right away if you are not doing well or get worse. Document Released: 09/02/2007 Document Revised: 11/16/2012 Document Reviewed: 09/21/2012 ExitCare Patient Information 2015 [employer]. This information is not intended to replace advice given to you by your health care provider. Make sure you discuss any questions you have with your health care provider.  

## 2014-07-11 NOTE — ED Notes (Signed)
Pt verbalized understanding of no driving and to use caution within 4 hours of taking pain meds due to meds cause drowsiness 

## 2014-07-12 NOTE — Progress Notes (Signed)
Urine drug screen for this encounter is consistent for prescribed medication 

## 2014-07-12 NOTE — ED Provider Notes (Signed)
CSN: [id number]     Arrival date & time 07/11/14  1036 History   First MD Initiated Contact with Patient 07/11/14 1108     Chief Complaint  Patient presents with  . Neck Pain     (Consider location/radiation/quality/duration/timing/severity/associated sxs/prior Treatment) HPI  [name] is a 49 y.o. female who presents to the Emergency Department complaining of right sided neck and shoulder pain for for several days.  She reports hx of cervical spine surgery x 2 with last surgery in 2013.  She noticed gradual onset of pain to her neck and swelling to the base of her neck.  Pain is worse with contralateral rotation of the neck and abduction of the right arm.  She denies known injury, discoloration, numbness or weakness of the extremities, headaches and dizziness.  She states she has been on pain medication in the past for her neck pain, but now only takes OTC analgesics.   Past Medical History  Diagnosis Date  . Asthma   . MS (multiple sclerosis)   . Migraine headache   . GERD (gastroesophageal reflux disease)   . Chronic neck pain     C4-6 fusion  . Sleep apnea   . Helicobacter pylori gastritis 2011  . Fatty liver   . MIGRAINE, COMMON 05/14/2009    Qualifier: Diagnosis of  By: Ricard Dillon    . CONSTIPATION 05/16/2009    Qualifier: Diagnosis of  By: Yetta Barre FNP-BC, Kandice L   . LIVER FUNCTION TESTS, ABNORMAL, HX OF 05/14/2009    Qualifier: Diagnosis of  By: Ricard Dillon    . Dysphagia 02/23/2012  . Chronic abdominal pain    Past Surgical History  Procedure Laterality Date  . Neck surgery      X2, last time 2013  . Appendectomy    . Cholecystectomy    . Abdominal hysterectomy    . Mandible surgery    . Colonoscopy  07/09/2009    RMR; normal rectum aside from anal canal hemorrhoids/scattered left-sided diverticula  . Esophagogastroduodenoscopy  05/22/2009    RMR; normal /small HH  . Esophagogastroduodenoscopy  2007    RMR: non-critical Schatzki's rin,  non-maniuplated  . Back surgery  03/07/2012    Nerve Stimulator  . Esophagogastroduodenoscopy (egd) with esophageal dilation  03/17/2012    DBZ:MCEYEMVV appearing esophageal mucosa of uncertain significance. Status post [name] dilation followed by esophageal bx  . Esophagogastroduodenoscopy N/A 10/24/2013    Procedure: ESOPHAGOGASTRODUODENOSCOPY (EGD);  Surgeon: Corbin Ade, MD;  Location: [facility];  Service: Endoscopy;  Laterality: N/A;  9:45  . Maloney dilation N/A 10/24/2013    Procedure: [name] DILATION;  Surgeon: Corbin Ade, MD;  Location: [facility];  Service: Endoscopy;  Laterality: N/A;   Family History  Problem Relation Age of Onset  . Colon cancer Neg Hx   . Cancer Mother     unknown type  . Diabetes Father   . Diabetes Sister    History  Substance Use Topics  . Smoking status: Never Smoker   . Smokeless tobacco: Not on file  . Alcohol Use: No   OB History    Gravida Para Term Preterm AB TAB SAB Ectopic Multiple Living   5 5 5             Review of Systems  Constitutional: Negative for fever and chills.  Respiratory: Negative for shortness of breath.   Cardiovascular: Negative for chest pain.  Gastrointestinal: Negative for nausea, vomiting and abdominal pain.  Genitourinary: Negative for  dysuria and difficulty urinating.  Musculoskeletal: Positive for arthralgias (right shoulder pain) and neck pain. Negative for joint swelling.  Skin: Negative for color change and wound.  Neurological: Negative for dizziness, syncope, weakness, numbness and headaches.  All other systems reviewed and are negative.     Allergies  Peanut-containing drug products; Gluten meal; Latex; Prednisone; Nexium; and Tape  Home Medications   Prior to Admission medications   Medication Sig Start Date End Date Taking? Authorizing Provider  albuterol (PROVENTIL HFA;VENTOLIN HFA) 108 (90 BASE) MCG/ACT inhaler Inhale 2 puffs into the lungs every 6 (six) hours as needed. For  Asthma   Yes Historical Provider, MD  amitriptyline (ELAVIL) 100 MG tablet Take 100 mg by mouth at bedtime.   Yes Historical Provider, MD  amoxicillin (AMOXIL) 500 MG tablet Take 1,000 mg by mouth 2 (two) times daily. 07/04/14 07/18/14 Yes Historical Provider, MD  aspirin EC 81 MG EC tablet Take 1 tablet (81 mg total) by mouth daily. 04/17/12  Yes Erick Blinks, MD  baclofen (LIORESAL) 20 MG tablet Take 20 mg by mouth 3 (three) times daily as needed. 05/08/14  Yes Historical Provider, MD  citalopram (CELEXA) 40 MG tablet Take 40 mg by mouth every evening.    Yes Historical Provider, MD  COPAXONE 40 MG/ML SOSY Inject into the muscle 3 (three) times a week. 10/13/13  Yes Historical Provider, MD  diazepam (VALIUM) 2 MG tablet Take 2 mg by mouth daily as needed for muscle spasms (**Takes only if Baclofen is ineffective**). TAKE 1 TABLET BY MOUTH DAILY AS NEEDED FOR SPASMS 06/13/13  Yes Historical Provider, MD  diltiazem (CARDIZEM CD) 120 MG 24 hr capsule Take 1 capsule (120 mg total) by mouth daily. 04/17/12  Yes Erick Blinks, MD  gabapentin (NEURONTIN) 300 MG capsule Take 600 mg by mouth 3 (three) times daily.    Yes Historical Provider, MD  Linaclotide Karlene Einstein) 145 MCG CAPS capsule Take 1 capsule (145 mcg total) by mouth daily. On empty stomach for constipation 10/19/13  Yes Tiffany Kocher, PA-C  meloxicam (MOBIC) 7.5 MG tablet Take 1 tablet (7.5 mg total) by mouth daily. 07/02/14  Yes Erick Colace, MD  oxybutynin (DITROPAN) 5 MG tablet Take 5 mg by mouth 2 (two) times daily. 07/02/14  Yes Historical Provider, MD  pantoprazole (PROTONIX) 40 MG tablet Take 1 tablet (40 mg total) by mouth daily. Patient taking differently: Take 40 mg by mouth 2 (two) times daily.  10/19/13  Yes Tiffany Kocher, PA-C  promethazine (PHENERGAN) 25 MG suppository Place 1 suppository (25 mg total) rectally every 6 (six) hours as needed for nausea or vomiting. 06/26/14  Yes Samuel Jester, DO  tetracycline (ACHROMYCIN,SUMYCIN) 250  MG capsule Take 500 mg by mouth.  07/04/14 07/18/14 Yes Historical Provider, MD  methocarbamol (ROBAXIN) 500 MG tablet Take 1 tablet (500 mg total) by mouth 3 (three) times daily. 07/11/14   Minyon Billiter, PA-C  oxyCODONE-acetaminophen (PERCOCET/ROXICET) 5-325 MG per tablet Take 1 tablet by mouth every 4 (four) hours as needed. 07/11/14   Malyiah Fellows, PA-C   BP 131/68 mmHg  Pulse 94  Temp(Src) 98.9 F (37.2 C) (Oral)  Resp 18  Ht  (1.651 m)  Wt 230 lb (104.327 kg)  BMI 38.27 kg/m2  SpO2 100% Physical Exam  Constitutional: She is oriented to person, place, and time. She appears well-developed and well-nourished. No distress.  HENT:  Head: Normocephalic and atraumatic.  Mouth/Throat: Oropharynx is clear and moist.  Neck: Normal range of motion.  No JVD present. No tracheal deviation present. No thyromegaly present.  Localized tenderness to the lateral right neck at the supraclavicular area.  No erythema, masses, or obvious edema   Cardiovascular: Normal rate, regular rhythm, normal heart sounds and intact distal pulses.   No murmur heard. Pulmonary/Chest: Effort normal and breath sounds normal. She exhibits no tenderness.  Musculoskeletal: Normal range of motion.  Lymphadenopathy:    She has no cervical adenopathy.  Neurological: She is alert and oriented to person, place, and time. She has normal strength. Coordination normal.  Reflex Scores:      Tricep reflexes are 2+ on the right side and 2+ on the left side.      Bicep reflexes are 2+ on the right side and 2+ on the left side. Skin: Skin is warm and dry. No erythema.  Psychiatric: She has a normal mood and affect.  Nursing note and vitals reviewed.   ED Course  Procedures (including critical care time) Labs Review Labs Reviewed - No data to display  Imaging Review Dg Chest 2 View  07/11/2014   CLINICAL DATA:  Pain for 2 days mostly RIGHT-sided neck radiating down RIGHT arm and into RIGHT chest, history of asthma,  multiple sclerosis, GERD  EXAM: CHEST  2 VIEW  COMPARISON:  06/26/2014  FINDINGS: Prior cervical spine fusion.  Intraspinal stimulator leads mid inferior thoracic spine.  Mild enlargement of cardiac silhouette.  Mediastinal contours and pulmonary vascularity normal.  Bronchitic changes with RIGHT basilar atelectasis.  No acute infiltrate, pleural effusion or pneumothorax.  No acute osseous findings.  IMPRESSION: Mild enlargement of cardiac silhouette.  Bronchitic changes with RIGHT basilar atelectasis.   Electronically Signed   By: Ulyses Southward M.D.   On: 07/11/2014 12:43   Dg Cervical Spine Complete  07/11/2014   CLINICAL DATA:  Pain for 2 days mostly RIGHT-sided neck down RIGHT arm, hurts to raise arm, prior cervical fusion  EXAM: CERVICAL SPINE  4+ VIEWS  COMPARISON:  05/29/2010  FINDINGS: Prevertebral soft tissue use upper normal thickness.  Bones demineralized.  Prior anterior fusion of C4-C7.  Reversal of cervical lordosis at the upper cervical spine.  Bony foramina patent.  Hardware intact.  No acute fracture, subluxation, or bone destruction.  Facet degenerative changes upper cervical spine.  Lung apices clear.  C1-C2 alignment normal.  IMPRESSION: Prior C4-C7 fusion anteriorly.  Facet degenerative changes upper cervical spine with slight reversal of cervical lordosis.  No additional significant abnormalities.   Electronically Signed   By: Ulyses Southward M.D.   On: 07/11/2014 12:41     EKG Interpretation None      MDM   Final diagnoses:  Cervical strain, initial encounter    Pt is well appearing.  No focal neuro deficits.  No concerning sx's for emergent neurological or infectious process.  Airway is patent.  No nuchal rigidity.  Discussed patient hx and treatment plan with Dr. Juleen China.  XR's are reassuring.  Given that patient has hx of previous cervical surgery, she agrees to arrange f/u with her surgeon.  She agrees to symptomatic tx and also agrees to return here if needed.   [name] 07/12/14 2227  [name], MD 07/13/14 1630

## 2014-08-02 ENCOUNTER — Ambulatory Visit: Payer: BLUE CROSS/BLUE SHIELD | Admitting: Physical Medicine & Rehabilitation

## 2014-08-02 ENCOUNTER — Encounter: Payer: BLUE CROSS/BLUE SHIELD | Attending: Physical Medicine & Rehabilitation

## 2014-08-02 DIAGNOSIS — Z5181 Encounter for therapeutic drug level monitoring: Secondary | ICD-10-CM | POA: Insufficient documentation

## 2014-08-02 DIAGNOSIS — Z79899 Other long term (current) drug therapy: Secondary | ICD-10-CM | POA: Insufficient documentation

## 2014-08-02 DIAGNOSIS — G8929 Other chronic pain: Secondary | ICD-10-CM | POA: Insufficient documentation

## 2014-08-02 DIAGNOSIS — M47814 Spondylosis without myelopathy or radiculopathy, thoracic region: Secondary | ICD-10-CM | POA: Insufficient documentation

## 2014-08-02 DIAGNOSIS — G894 Chronic pain syndrome: Secondary | ICD-10-CM | POA: Insufficient documentation

## 2014-08-02 DIAGNOSIS — M545 Low back pain: Secondary | ICD-10-CM | POA: Insufficient documentation

## 2014-09-14 ENCOUNTER — Other Ambulatory Visit (HOSPITAL_COMMUNITY): Payer: Self-pay | Admitting: Internal Medicine

## 2014-09-14 DIAGNOSIS — Z1231 Encounter for screening mammogram for malignant neoplasm of breast: Secondary | ICD-10-CM

## 2014-09-19 ENCOUNTER — Ambulatory Visit (HOSPITAL_COMMUNITY): Payer: BLUE CROSS/BLUE SHIELD

## 2015-05-27 ENCOUNTER — Ambulatory Visit: Payer: BLUE CROSS/BLUE SHIELD | Admitting: Orthopedic Surgery

## 2015-05-27 ENCOUNTER — Encounter: Payer: Self-pay | Admitting: *Deleted

## 2015-07-04 ENCOUNTER — Emergency Department (HOSPITAL_COMMUNITY): Payer: BLUE CROSS/BLUE SHIELD

## 2015-07-04 ENCOUNTER — Encounter (HOSPITAL_COMMUNITY): Payer: Self-pay | Admitting: Emergency Medicine

## 2015-07-04 ENCOUNTER — Emergency Department (HOSPITAL_COMMUNITY)
Admission: EM | Admit: 2015-07-04 | Discharge: 2015-07-04 | Disposition: A | Discharge status: Home / Self Care | Payer: BLUE CROSS/BLUE SHIELD | Attending: Emergency Medicine | Admitting: Emergency Medicine

## 2015-07-04 DIAGNOSIS — S92411A Displaced fracture of proximal phalanx of right great toe, initial encounter for closed fracture: Secondary | ICD-10-CM | POA: Diagnosis not present

## 2015-07-04 DIAGNOSIS — M542 Cervicalgia: Secondary | ICD-10-CM | POA: Diagnosis not present

## 2015-07-04 DIAGNOSIS — Y939 Activity, unspecified: Secondary | ICD-10-CM | POA: Insufficient documentation

## 2015-07-04 DIAGNOSIS — Y999 Unspecified external cause status: Secondary | ICD-10-CM | POA: Insufficient documentation

## 2015-07-04 DIAGNOSIS — R531 Weakness: Secondary | ICD-10-CM | POA: Insufficient documentation

## 2015-07-04 DIAGNOSIS — S99921A Unspecified injury of right foot, initial encounter: Secondary | ICD-10-CM | POA: Diagnosis present

## 2015-07-04 DIAGNOSIS — W19XXXA Unspecified fall, initial encounter: Secondary | ICD-10-CM | POA: Diagnosis not present

## 2015-07-04 DIAGNOSIS — Y929 Unspecified place or not applicable: Secondary | ICD-10-CM | POA: Insufficient documentation

## 2015-07-04 DIAGNOSIS — R51 Headache: Secondary | ICD-10-CM | POA: Diagnosis not present

## 2015-07-04 DIAGNOSIS — J45909 Unspecified asthma, uncomplicated: Secondary | ICD-10-CM | POA: Diagnosis not present

## 2015-07-04 DIAGNOSIS — S92511A Displaced fracture of proximal phalanx of right lesser toe(s), initial encounter for closed fracture: Secondary | ICD-10-CM | POA: Insufficient documentation

## 2015-07-04 DIAGNOSIS — E119 Type 2 diabetes mellitus without complications: Secondary | ICD-10-CM | POA: Diagnosis not present

## 2015-07-04 DIAGNOSIS — S92911A Unspecified fracture of right toe(s), initial encounter for closed fracture: Secondary | ICD-10-CM

## 2015-07-04 HISTORY — DX: Type 2 diabetes mellitus without complications: E11.9

## 2015-07-04 MED ORDER — OXYCODONE-ACETAMINOPHEN 5-325 MG PO TABS: 1.0000 | ORAL_TABLET | Freq: Four times a day (QID) | ORAL | Status: DC | PRN

## 2015-07-04 MED ORDER — KETOROLAC TROMETHAMINE 10 MG PO TABS
10.0000 mg | ORAL_TABLET | Freq: Once | ORAL | Status: AC
  Administered 2015-07-04: 10 mg via ORAL
  Filled 2015-07-04: qty 1

## 2015-07-04 MED ORDER — DICLOFENAC SODIUM 75 MG PO TBEC: 75.0000 mg | DELAYED_RELEASE_TABLET | Freq: Two times a day (BID) | ORAL | Status: DC

## 2015-07-04 MED ORDER — OXYCODONE-ACETAMINOPHEN 5-325 MG PO TABS
1.0000 | ORAL_TABLET | Freq: Once | ORAL | Status: AC
  Administered 2015-07-04: 1 via ORAL
  Filled 2015-07-04: qty 1

## 2015-07-04 MED ORDER — PROMETHAZINE HCL 12.5 MG PO TABS
12.5000 mg | ORAL_TABLET | Freq: Once | ORAL | Status: AC
  Administered 2015-07-04: 12.5 mg via ORAL
  Filled 2015-07-04: qty 1

## 2015-07-04 NOTE — Discharge Instructions (Signed)
Your x-ray reveals multiple fractures of the right first toe, and fractures of the right second toe. Please apply ice over the next 36 hours. Please keep your foot elevated above your waist. Use diclofenac to decrease inflammation and swelling, use Percocet for pain. Percocet may cause drowsiness, and/or constipation. You may benefit from using a stool softener while taking this medication. Please do not drink alcohol, drive a vehicle, operating machinery, or pedis patent activities requiring concentration when taking this medication. Please see the podiatry specialist listed above, or the podiatry specialist of your choice as soon as possible for podiatry evaluation of these fractures. Please monitor your glucose carefully. Toe Fracture A toe fracture is a break in one of the toe bones (phalanges). CAUSES This condition may be caused by:  Dropping a heavy object on your toe.  Stubbing your toe.  Overusing your toe or doing repetitive exercise.  Twisting or stretching your toe out of place. RISK FACTORS This condition is more likely to develop in people who:  Play contact sports.  Have a bone disease.  Have a low calcium level. SYMPTOMS The main symptoms of this condition are swelling and pain in the toe. The pain may get worse with standing or walking. Other symptoms include:  Bruising.  Stiffness.  Numbness.  A change in the way the toe looks.  Broken bones that poke through the skin.  Blood beneath the toenail. DIAGNOSIS This condition is diagnosed with a physical exam. You may also have X-rays. TREATMENT  Treatment for this condition depends on the type of fracture and its severity. Treatment may involve:  Taping the broken toe to a toe that is next to it (buddy taping). This is the most common treatment for fractures in which the bone has not moved out of place (nondisplaced fracture).  Wearing a shoe that has a wide, rigid sole to protect the toe and to limit its  movement.  Wearing a walking cast.  Having a procedure to move the toe back into place.  Surgery. This may be needed:  If there are many pieces of broken bone that are out of place (displaced).  If the toe joint breaks.  If the bone breaks through the skin.  Physical therapy. This is done to help regain movement and strength in the toe. You may need follow-up X-rays to make sure that the bone is healing well and staying in position. HOME CARE INSTRUCTIONS If You Have a Cast:  Do not stick anything inside the cast to scratch your skin. Doing that increases your risk of infection.  Check the skin around the cast every day. Report any concerns to your health care provider. You may put lotion on dry skin around the edges of the cast. Do not apply lotion to the skin underneath the cast.  Do not put pressure on any part of the cast until it is fully hardened. This may take several hours.  Keep the cast clean and dry. Bathing  Do not take baths, swim, or use a hot tub until your health care provider approves. Ask your health care provider if you can take showers. You may only be allowed to take sponge baths for bathing.  If your health care provider approves bathing and showering, cover the cast or bandage (dressing) with a watertight plastic bag to protect it from water. Do not let the cast or dressing get wet. Managing Pain, Stiffness, and Swelling  If you do not have a cast, apply ice to the  injured area, if directed.  Put ice in a plastic bag.  Place a towel between your skin and the bag.  Leave the ice on for 20 minutes, 2-3 times per day.  Move your toes often to avoid stiffness and to lessen swelling.  Raise (elevate) the injured area above the level of your heart while you are sitting or lying down. Driving  Do not drive or operate heavy machinery while taking pain medicine.  Do not drive while wearing a cast on a foot that you use for driving. Activity  Return to  your normal activities as directed by your health care provider. Ask your health care provider what activities are safe for you.  Perform exercises daily as directed by your health care provider or physical therapist. Safety  Do not use the injured limb to support your body weight until your health care provider says that you can. Use crutches or other assistive devices as directed by your health care provider. General Instructions  If your toe was treated with buddy taping, follow your health care provider's instructions for changing the gauze and tape. Change it more often:  The gauze and tape get wet. If this happens, dry the space between the toes.  The gauze and tape are too tight and cause your toe to become pale or numb.  Wear a protective shoe as directed by your health care provider. If you were not given a protective shoe, wear sturdy, supportive shoes. Your shoes should not pinch your toes and should not fit tightly against your toes.  Do not use any tobacco products, including cigarettes, chewing tobacco, or e-cigarettes. Tobacco can delay bone healing. If you need help quitting, ask your health care provider.  Take medicines only as directed by your health care provider.  Keep all follow-up visits as directed by your health care provider. This is important. SEEK MEDICAL CARE IF:  You have a fever.  Your pain medicine is not helping.  Your toe is cold.  Your toe is numb.  You still have pain after one week of rest and treatment.  You still have pain after your health care provider has said that you can start walking again.  You have pain, tingling, or numbness in your foot that is not going away. SEEK IMMEDIATE MEDICAL CARE IF:  You have severe pain.  You have redness or inflammation in your toe that is getting worse.  You have pain or numbness in your toe that is getting worse.  Your toe turns blue.   This information is not intended to replace advice given  to you by your health care provider. Make sure you discuss any questions you have with your health care provider.   Document Released: 03/13/2000 Document Revised: 12/05/2014 Document Reviewed: 01/10/2014 Elsevier Interactive Patient Education [employer]

## 2015-07-04 NOTE — ED Notes (Signed)
Pt reports RT leg giving out. Hx of MS. States she slid down approx 15 steps. Pt c/o RT leg and foot pain. No deformity noted. Pt ambulatory. Denies LOC or hitting head.

## 2015-07-04 NOTE — ED Provider Notes (Signed)
CSN: [id number]     Arrival date & time 07/04/15  1614 History   First MD Initiated Contact with Patient 07/04/15 1626     Chief Complaint  Patient presents with  . Fall     (Consider location/radiation/quality/duration/timing/severity/associated sxs/prior Treatment) Patient is a 50 y.o. female presenting with fall. The history is provided by the patient.  Fall This is a new problem. The current episode started today. The problem occurs constantly. The problem has been gradually worsening. Associated symptoms include headaches, neck pain and weakness. Associated symptoms comments: Right leg and foot pain. The symptoms are aggravated by standing. She has tried nothing for the symptoms. The treatment provided no relief.    Past Medical History  Diagnosis Date  . Asthma   . MS (multiple sclerosis) (HCC)   . Migraine headache   . GERD (gastroesophageal reflux disease)   . Chronic neck pain     C4-6 fusion  . Sleep apnea   . Helicobacter pylori gastritis 2011  . Fatty liver   . MIGRAINE, COMMON 05/14/2009    Qualifier: Diagnosis of  By: Ricard Dillon    . CONSTIPATION 05/16/2009    Qualifier: Diagnosis of  By: Yetta Barre FNP-BC, Kandice L   . LIVER FUNCTION TESTS, ABNORMAL, HX OF 05/14/2009    Qualifier: Diagnosis of  By: Ricard Dillon    . Dysphagia 02/23/2012  . Chronic abdominal pain   . Diabetes mellitus without complication [facility])    Past Surgical History  Procedure Laterality Date  . Neck surgery      X2, last time 2013  . Appendectomy    . Cholecystectomy    . Abdominal hysterectomy    . Mandible surgery    . Colonoscopy  07/09/2009    RMR; normal rectum aside from anal canal hemorrhoids/scattered left-sided diverticula  . Esophagogastroduodenoscopy  05/22/2009    RMR; normal /small HH  . Esophagogastroduodenoscopy  2007    RMR: non-critical Schatzki's rin, non-maniuplated  . Back surgery  03/07/2012    Nerve Stimulator  . Esophagogastroduodenoscopy (egd) with  esophageal dilation  03/17/2012    [id number] appearing esophageal mucosa of uncertain significance. Status post [name] dilation followed by esophageal bx  . Esophagogastroduodenoscopy N/A 10/24/2013    Procedure: ESOPHAGOGASTRODUODENOSCOPY (EGD);  Surgeon: Corbin Ade, MD;  [facility];  Service: Endoscopy;  Laterality: N/A;  9:45  . Maloney dilation N/A 10/24/2013    Procedure: [name] DILATION;  Surgeon: Corbin Ade, MD;  [facility];  Service: Endoscopy;  Laterality: N/A;   Family History  Problem Relation Age of Onset  . Colon cancer Neg Hx   . Cancer Mother     unknown type  . Diabetes Father   . Diabetes Sister    Social History  Substance Use Topics  . Smoking status: Never Smoker   . Smokeless tobacco: None  . Alcohol Use: No   OB History    Gravida Para Term Preterm AB TAB SAB Ectopic Multiple Living   Review of Systems  Musculoskeletal: Positive for neck pain.  Neurological: Positive for weakness and headaches.  All other systems reviewed and are negative.     Allergies  Peanut-containing drug products; Gluten meal; Latex; Prednisone; Nexium; and Tape  Home Medications   Prior to Admission medications   Medication Sig Start Date End Date Taking? Authorizing Provider  albuterol (PROVENTIL HFA;VENTOLIN HFA) 108 (90 BASE) MCG/ACT inhaler Inhale  2 puffs into the lungs every 6 (six) hours as needed. For Asthma   Yes Historical Provider, MD  baclofen (LIORESAL) 20 MG tablet Take 20 mg by mouth 3 (three) times daily.  05/08/14  Yes Historical Provider, MD  citalopram (CELEXA) 40 MG tablet Take 40 mg by mouth every evening.    Yes Historical Provider, MD  COPAXONE 40 MG/ML SOSY Inject into the muscle 3 (three) times a week. 10/13/13  Yes Historical Provider, MD  diazepam (VALIUM) 2 MG tablet Take 2 mg by mouth daily as needed for muscle spasms (**Takes only if Baclofen is ineffective**). TAKE 1 TABLET BY MOUTH DAILY AS  NEEDED FOR SPASMS 06/13/13  Yes Historical Provider, MD  gabapentin (NEURONTIN) 300 MG capsule Take 600 mg by mouth 3 (three) times daily.    Yes Historical Provider, MD  glipiZIDE (GLUCOTROL) 10 MG tablet Take 10 mg by mouth daily before breakfast.   Yes Historical Provider, MD  lisinopril (PRINIVIL,ZESTRIL) 2.5 MG tablet Take 2.5 mg by mouth daily.   Yes Historical Provider, MD  metFORMIN (GLUCOPHAGE) 500 MG tablet Take 1,000 mg by mouth 2 (two) times daily with a meal.   Yes Historical Provider, MD  oxybutynin (DITROPAN) 5 MG tablet Take 5 mg by mouth 2 (two) times daily. 07/02/14  Yes Historical Provider, MD  Oxycodone HCl 10 MG TABS Take 10 mg by mouth every 4 (four) hours as needed (for pain).    Yes Historical Provider, MD  promethazine (PHENERGAN) 25 MG suppository Place 1 suppository (25 mg total) rectally every 6 (six) hours as needed for nausea or vomiting. 06/26/14  Yes Samuel Jester, DO   BP 136/78 mmHg  Pulse 96  Temp(Src) 98.4 F (36.9 C) (Oral)  Resp 16  Ht 5\' 5"  (1.651 m)  Wt 97.523 kg  BMI 35.78 kg/m2  SpO2 97% Physical Exam  Constitutional: She is oriented to person, place, and time. She appears well-developed and well-nourished.  Non-toxic appearance.  HENT:  Head: Normocephalic.  Right Ear: Tympanic membrane and external ear normal.  Left Ear: Tympanic membrane and external ear normal.  Eyes: EOM and lids are normal. Pupils are equal, round, and reactive to light.  Neck: Normal range of motion. Neck supple. Carotid bruit is not present.  Cardiovascular: Normal rate, regular rhythm, normal heart sounds, intact distal pulses and normal pulses.   Pulmonary/Chest: Breath sounds normal. No respiratory distress.  Abdominal: Soft. Bowel sounds are normal. There is no tenderness. There is no guarding.  Musculoskeletal: Normal range of motion.  There is swelling of the second toe of the right. There is soreness to palpation of the first toe on the right. There is pain of the  metatarsal heads of the right foot. The Achilles tendon is intact. There is no deformity of the tibial area. There is good range of motion of the right knee and hip. Dorsalis pedis pulses 2+.  Lymphadenopathy:       Head (right side): No submandibular adenopathy present.       Head (left side): No submandibular adenopathy present.    She has no cervical adenopathy.  Neurological: She is alert and oriented to person, place, and time. She has normal strength. No cranial nerve deficit or sensory deficit.  Skin: Skin is warm and dry.  Psychiatric: She has a normal mood and affect. Her speech is normal.  Nursing note and vitals reviewed.   ED Course  Procedures (including critical care time) FRACTURE CARE RIGHT FOOT. Patient had a fall down  several steps today, and sustained fracture to the right first and second toe. I discussed the fractures with the patient in terms which he understands. I discussed the procedure of splinting in terms which he understands. The patient gives permission for the procedure.  The patient is identified by arm band. Procedural time out taken. The first and second toes were buddy taped. Following this the patient was fitted with a postoperative shoe. Ice pack was applied. The patient was treated with Toradol and Percocet for pain. The patient tolerated the procedure without problem. Following the procedure, patient has good capillary refill, and no temperature changes of the right lower extremity at this time. Labs Review Labs Reviewed - No data to display  Imaging Review No results found. I have personally reviewed and evaluated these images and lab results as part of my medical decision-making.   EKG Interpretation None      MDM  Vital signs reviewed. X-ray of the right foot reveals a comminuted intra-articular fracture involving the proximal phalanx of the first toe, and proximal phalanx fracture of the second toe.  The patient was fitted with a  postoperative shoe, and had a buddy tape splint of the toes. The plan at this time is for the patient be seen by podiatry. The patient will be treated with Percocet and diclofenac. We discussed ice and elevation. Questions were answered. Patient is in agreement with this discharge plan.    Final diagnoses:  None    **I have reviewed nursing notes, vital signs, and all appropriate lab and imaging results for this patient.Ivery Quale, PA-C 07/04/15 1754  Lavera Guise, MD 07/05/15 [phone]

## 2015-07-09 ENCOUNTER — Ambulatory Visit (INDEPENDENT_AMBULATORY_CARE_PROVIDER_SITE_OTHER): Payer: BLUE CROSS/BLUE SHIELD | Admitting: Orthopaedic Surgery

## 2015-07-09 ENCOUNTER — Ambulatory Visit (INDEPENDENT_AMBULATORY_CARE_PROVIDER_SITE_OTHER): Payer: BLUE CROSS/BLUE SHIELD

## 2015-07-09 VITALS — BP 122/76 | HR 86 | Temp 97.0°F | Ht 65.0 in | Wt 213.4 lb

## 2015-07-09 DIAGNOSIS — S92911D Unspecified fracture of right toe(s), subsequent encounter for fracture with routine healing: Secondary | ICD-10-CM

## 2015-07-09 DIAGNOSIS — S92911A Unspecified fracture of right toe(s), initial encounter for closed fracture: Secondary | ICD-10-CM | POA: Diagnosis not present

## 2015-07-09 NOTE — Progress Notes (Signed)
Subjective: I broke two toes    Patient ID: [name], female    DOB: [birth year], 50 y.o.   MRN: [id number]  Foot Injury  The incident occurred 3 to 5 days ago. The incident occurred at home. The injury mechanism was a fall. The pain is present in the right toes. The quality of the pain is described as aching. The pain is at a severity of 3/10. The pain is mild. The pain has been improving since onset. Associated symptoms include a loss of motion. Pertinent negatives include no loss of sensation, muscle weakness, numbness or tingling. The symptoms are aggravated by weight bearing. She has tried ice, immobilization and rest for the symptoms. The treatment provided moderate relief.   She fell down a few steps at her home on 07-04-15 and hurt her right great toe and second toe.  She was seen in the ER.  X-rays showed a fracture of the great toe distal part of the proximal phalanx and the second toe distal part of the proximal phalanx.  The fractures are nondisplaced.  She was given a post op shoe.  She has no other injury.   Review of Systems  HENT: Negative for congestion.   Respiratory: Positive for shortness of breath. Negative for cough.   Cardiovascular: Negative for chest pain and leg swelling.  Musculoskeletal: Positive for myalgias, joint swelling and gait problem.  Neurological: Negative for tingling and numbness.   She has diabetes and hypertension Past Medical History  Diagnosis Date  . Asthma   . MS (multiple sclerosis) (HCC)   . Migraine headache   . GERD (gastroesophageal reflux disease)   . Chronic neck pain     C4-6 fusion  . Sleep apnea   . Helicobacter pylori gastritis 2011  . Fatty liver   . MIGRAINE, COMMON 05/14/2009    Qualifier: Diagnosis of  By: Ricard Dillon    . CONSTIPATION 05/16/2009    Qualifier: Diagnosis of  By: Yetta Barre FNP-BC, Kandice L   . LIVER FUNCTION TESTS, ABNORMAL, HX OF 05/14/2009    Qualifier: Diagnosis of  By: Ricard Dillon    . Dysphagia  02/23/2012  . Chronic abdominal pain   . Diabetes mellitus without complication [facility])    Past Surgical History  Procedure Laterality Date  . Neck surgery      X2, last time 2013  . Appendectomy    . Cholecystectomy    . Abdominal hysterectomy    . Mandible surgery    . Colonoscopy  07/09/2009    RMR; normal rectum aside from anal canal hemorrhoids/scattered left-sided diverticula  . Esophagogastroduodenoscopy  05/22/2009    RMR; normal /small HH  . Esophagogastroduodenoscopy  2007    RMR: non-critical Schatzki's rin, non-maniuplated  . Back surgery  03/07/2012    Nerve Stimulator  . Esophagogastroduodenoscopy (egd) with esophageal dilation  03/17/2012    [id number] appearing esophageal mucosa of uncertain significance. Status post [name] dilation followed by esophageal bx  . Esophagogastroduodenoscopy N/A 10/24/2013    Procedure: ESOPHAGOGASTRODUODENOSCOPY (EGD);  Surgeon: Corbin Ade, MD;  [facility];  Service: Endoscopy;  Laterality: N/A;  9:45  . Maloney dilation N/A 10/24/2013    Procedure: [name] DILATION;  Surgeon: Corbin Ade, MD;  [facility];  Service: Endoscopy;  Laterality: N/A;   Social History   Social History  . Marital Status: Married    Spouse Name: N/A  . Number of Children: N/A  . Years of Education: N/A  Occupational History  . Not on file.   Social History Main Topics  . Smoking status: Never Smoker   . Smokeless tobacco: Not on file  . Alcohol Use: No  . Drug Use: No  . Sexual Activity: Yes    Birth Control/ Protection: Surgical   Other Topics Concern  . Not on file   Social History Narrative   5 kids, raising grandchild   BP 122/76 mmHg  Pulse 86  Temp(Src) 97 F (36.1 C)  Ht  (1.651 m)  Wt 213 lb 6.4 oz (96.798 kg)  BMI 35.51 kg/m2     Objective:   Physical Exam  Constitutional: She is oriented to person, place, and time. She appears well-developed and well-nourished.  HENT:  Head:  Normocephalic and atraumatic.  Eyes: Conjunctivae and EOM are normal. Pupils are equal, round, and reactive to light.  Neck: Normal range of motion. Neck supple.  Cardiovascular: Normal rate, regular rhythm and intact distal pulses.   Pulmonary/Chest: Effort normal.  Abdominal: Soft.  Musculoskeletal: She exhibits tenderness (There is pain and swelling of the right great toe and second toe  NV is intact.  There is no redness.).  Neurological: She is alert and oriented to person, place, and time. She displays normal reflexes. No cranial nerve deficit. She exhibits normal muscle tone. Coordination normal.  Skin: Skin is warm and dry.  Psychiatric: She has a normal mood and affect. Her behavior is normal. Judgment and thought content normal.      Assessment & Plan:   Encounter Diagnoses  Name Primary?  Marland Kitchen Toe fracture, right, closed, initial encounter Yes  . Toe fracture, right, with routine healing, subsequent encounter    I have told her to buddy tape the toes together with gauze inbetween.  Extra tape given.  Continue the post op shoe.  Return in one week, x-ray then.

## 2015-07-16 ENCOUNTER — Ambulatory Visit: Payer: BLUE CROSS/BLUE SHIELD | Admitting: Orthopaedic Surgery

## 2015-07-23 LAB — LIPID PANEL
CHOLESTEROL: 232 — AB (ref 0–200)
HDL: 55 (ref 35–70)
LDL Cholesterol: 141
TRIGLYCERIDES: 181 — AB (ref 40–160)

## 2015-11-25 ENCOUNTER — Telehealth: Payer: Self-pay

## 2015-11-25 NOTE — Telephone Encounter (Signed)
[phone] patient received letter to schedule tcs

## 2015-12-04 NOTE — Telephone Encounter (Signed)
PT called to schedule a colonoscopy.  She was referred by Dr. Sherwood GamblerFusco. [name] she has constipation and goes 3-4 days with out having a BM. OV scheduled with Tana CoastLeslie Lewis, PA on 12/24/2015 at 11:30 Am.

## 2015-12-24 ENCOUNTER — Encounter: Payer: Self-pay | Admitting: Gastroenterology

## 2015-12-24 ENCOUNTER — Telehealth: Payer: Self-pay | Admitting: Gastroenterology

## 2015-12-24 ENCOUNTER — Ambulatory Visit: Payer: BLUE CROSS/BLUE SHIELD | Admitting: Gastroenterology

## 2015-12-24 NOTE — Telephone Encounter (Signed)
PATIENT WAS A NO SHOW AND LETTER SENT  °

## 2016-08-04 ENCOUNTER — Encounter: Payer: Self-pay | Admitting: Cardiology

## 2016-08-21 ENCOUNTER — Ambulatory Visit (INDEPENDENT_AMBULATORY_CARE_PROVIDER_SITE_OTHER): Payer: BLUE CROSS/BLUE SHIELD | Admitting: Cardiology

## 2016-08-21 VITALS — BP 110/84 | HR 120 | Ht 65.0 in | Wt 216.0 lb

## 2016-08-21 DIAGNOSIS — R000 Tachycardia, unspecified: Secondary | ICD-10-CM

## 2016-08-21 DIAGNOSIS — R079 Chest pain, unspecified: Secondary | ICD-10-CM

## 2016-08-21 NOTE — Progress Notes (Signed)
Clinical Summary [name] is a 51 y.o.female seen today as a new patient, referred by Dr Sherwood Gambler for chest pain and tachycardia.   1. Chest pain - seen in 2014 by Dr Eden Emms. Around that time CT PE negative.  - reported history of esophageal spasms with prior dilatations - similar chest pain  - presusre like midchest. Can occur at rest or with activity. 8/10 in severity. Can feel lightheaded. Not positional. Not related to food. Lasts up to 1 hour. Occurs 3 times a week - DOE with activities.     2. Tachycardia - long history of high heart rates - rates 108-120s, since 1989 - can have feeling of palpitations. Occurs roughly 3 times a week. Can feel lightheaded, can have chest pressure - can sometimes be related to stress.  - occasional coffee, iced tea x 2, occasional sodas, no EtoH - has chronic neck pain. Pain comes and goes.   Past Medical History:  Diagnosis Date  . Asthma   . Chronic abdominal pain   . Chronic neck pain    C4-6 fusion  . CONSTIPATION 05/16/2009   Qualifier: Diagnosis of  By: Yetta Barre FNP-BC, Kandice L   . Diabetes mellitus without complication (HCC)   . Dysphagia 02/23/2012  . Fatty liver   . GERD (gastroesophageal reflux disease)   . Helicobacter pylori gastritis 2011  . LIVER FUNCTION TESTS, ABNORMAL, HX OF 05/14/2009   Qualifier: Diagnosis of  By: Ricard Dillon    . Migraine headache   . MIGRAINE, COMMON 05/14/2009   Qualifier: Diagnosis of  By: Ricard Dillon    . MS (multiple sclerosis) (HCC)   . Sleep apnea      Allergies  Allergen Reactions  . Peanut-Containing Drug Products Anaphylaxis and Hives    Patient states she is allergic to all nuts  . Gluten Meal   . Latex Other (See Comments)    Reaction: blistering  . Prednisone Hives and Other (See Comments)    Reaction: causes psychological issues   . Nexium [Esomeprazole Magnesium] Hives  . Tape Rash    Paper tape please     Current Outpatient Prescriptions  Medication Sig  Dispense Refill  . albuterol (PROVENTIL HFA;VENTOLIN HFA) 108 (90 BASE) MCG/ACT inhaler Inhale 2 puffs into the lungs every 6 (six) hours as needed. For Asthma    . baclofen (LIORESAL) 20 MG tablet Take 20 mg by mouth 3 (three) times daily.   3  . citalopram (CELEXA) 40 MG tablet Take 40 mg by mouth every evening.     Marland Kitchen COPAXONE 40 MG/ML SOSY Inject into the muscle 3 (three) times a week.    . diazepam (VALIUM) 2 MG tablet Take 2 mg by mouth daily as needed for muscle spasms (**Takes only if Baclofen is ineffective**). TAKE 1 TABLET BY MOUTH DAILY AS NEEDED FOR SPASMS    . diclofenac (VOLTAREN) 75 MG EC tablet Take 1 tablet (75 mg total) by mouth 2 (two) times daily. 10 tablet 0  . gabapentin (NEURONTIN) 300 MG capsule Take 600 mg by mouth 3 (three) times daily.     Marland Kitchen glipiZIDE (GLUCOTROL) 10 MG tablet Take 10 mg by mouth daily before breakfast.    . lisinopril (PRINIVIL,ZESTRIL) 2.5 MG tablet Take 2.5 mg by mouth daily.    . metFORMIN (GLUCOPHAGE) 500 MG tablet Take 1,000 mg by mouth 2 (two) times daily with a meal.    . oxybutynin (DITROPAN) 5 MG tablet Take 5 mg by mouth 2 (  two) times daily.    . Oxycodone HCl 10 MG TABS Take 10 mg by mouth every 4 (four) hours as needed (for pain).     Marland Kitchen oxyCODONE-acetaminophen (PERCOCET/ROXICET) 5-325 MG tablet Take 1 tablet by mouth every 6 (six) hours as needed. 20 tablet 0  . promethazine (PHENERGAN) 25 MG suppository Place 1 suppository (25 mg total) rectally every 6 (six) hours as needed for nausea or vomiting. 10 each 0   No current facility-administered medications for this visit.      Past Surgical History:  Procedure Laterality Date  . ABDOMINAL HYSTERECTOMY    . APPENDECTOMY    . BACK SURGERY  03/07/2012   Nerve Stimulator  . CHOLECYSTECTOMY    . COLONOSCOPY  07/09/2009   RMR; normal rectum aside from anal canal hemorrhoids/scattered left-sided diverticula  . ESOPHAGOGASTRODUODENOSCOPY  05/22/2009   RMR; normal /small HH  .  ESOPHAGOGASTRODUODENOSCOPY  2007   RMR: non-critical Schatzki's rin, non-maniuplated  . ESOPHAGOGASTRODUODENOSCOPY N/A 10/24/2013   Procedure: ESOPHAGOGASTRODUODENOSCOPY (EGD);  Surgeon: Corbin Ade, MD;  [facility];  Service: Endoscopy;  Laterality: N/A;  9:45  . ESOPHAGOGASTRODUODENOSCOPY (EGD) WITH ESOPHAGEAL DILATION  03/17/2012   [id number] appearing esophageal mucosa of uncertain significance. Status post [name] dilation followed by esophageal bx  . MALONEY DILATION N/A 10/24/2013   Procedure: [name] DILATION;  Surgeon: Corbin Ade, MD;  [facility];  Service: Endoscopy;  Laterality: N/A;  . MANDIBLE SURGERY    . NECK SURGERY     X2, last time 2013     Allergies  Allergen Reactions  . Peanut-Containing Drug Products Anaphylaxis and Hives    Patient states she is allergic to all nuts  . Gluten Meal   . Latex Other (See Comments)    Reaction: blistering  . Prednisone Hives and Other (See Comments)    Reaction: causes psychological issues   . Nexium [Esomeprazole Magnesium] Hives  . Tape Rash    Paper tape please      Family History  Problem Relation Age of Onset  . Colon cancer Neg Hx   . Cancer Mother        unknown type  . Diabetes Father   . Diabetes Sister      Social History Ms. Labarre reports that she has never smoked. She does not have any smokeless tobacco history on file. Ms. Paisley reports that she does not drink alcohol.   Review of Systems CONSTITUTIONAL: No weight loss, fever, chills, weakness or fatigue.  HEENT: Eyes: No visual loss, blurred vision, double vision or yellow sclerae.No hearing loss, sneezing, congestion, runny nose or sore throat.  SKIN: No rash or itching.  CARDIOVASCULAR: per hpi RESPIRATORY: No shortness of breath, cough or sputum.  GASTROINTESTINAL: No anorexia, nausea, vomiting or diarrhea. No abdominal pain or blood.  GENITOURINARY: No burning on urination, no polyuria NEUROLOGICAL: No headache,  dizziness, syncope, paralysis, ataxia, numbness or tingling in the extremities. No change in bowel or bladder control.  MUSCULOSKELETAL: No muscle, back pain, joint pain or stiffness.  LYMPHATICS: No enlarged nodes. No history of splenectomy.  PSYCHIATRIC: No history of depression or anxiety.  ENDOCRINOLOGIC: No reports of sweating, cold or heat intolerance. No polyuria or polydipsia.  Marland Kitchen   Physical Examination Vitals:   08/21/16 1404  BP: 110/84  Pulse: (!) 120   Vitals:   08/21/16 1404  Weight: 216 lb (98 kg)  Height: 5\' 5"  (1.651 m)    Gen: resting comfortably, no acute distress HEENT:  no scleral icterus, pupils equal round and reactive, no palptable cervical adenopathy,  CV: regi;ar. Rate 120, n0 m/r/g, no jvd Resp: Clear to auscultation bilaterally GI: abdomen is soft, non-tender, non-distended, normal bowel sounds, no hepatosplenomegaly MSK: extremities are warm, no edema.  Skin: warm, no rash Neuro:  no focal deficits Psych: appropriate affect    Assessment and Plan  1. Chest pain - EKG in clinic shows sinus tach, no ishcemic changes - we will obtain stress echo to further evaluate pending initial echo results.   2. Sinus tachycardia - long history, unclear etiology - f/u echo and stress test   F/u 1 month   Antoine Poche, M.D.

## 2016-08-21 NOTE — Patient Instructions (Signed)
Your physician recommends that you schedule a follow-up appointment in: 1 Month  Your physician recommends that you continue on your current medications as directed. Please refer to the Current Medication list given to you today.  Your physician has requested that you have an echocardiogram. Echocardiography is a painless test that uses sound waves to create images of your heart. It provides your doctor with information about the size and shape of your heart and how well your heart's chambers and valves are working. This procedure takes approximately one hour. There are no restrictions for this procedure.   If you need a refill on your cardiac medications before your next appointment, please call your pharmacy.  Thank you for choosing [facility]!

## 2016-08-25 ENCOUNTER — Ambulatory Visit (HOSPITAL_COMMUNITY)
Admission: RE | Admit: 2016-08-25 | Discharge: 2016-08-25 | Disposition: A | Discharge status: Home / Self Care | Payer: BLUE CROSS/BLUE SHIELD | Source: Ambulatory Visit | Attending: Cardiology | Admitting: Cardiology

## 2016-08-25 DIAGNOSIS — R079 Chest pain, unspecified: Secondary | ICD-10-CM

## 2016-08-26 ENCOUNTER — Ambulatory Visit (HOSPITAL_COMMUNITY)
Admission: RE | Admit: 2016-08-26 | Discharge: 2016-08-26 | Disposition: A | Discharge status: Home / Self Care | Payer: BLUE CROSS/BLUE SHIELD | Source: Ambulatory Visit | Attending: Cardiology | Admitting: Cardiology

## 2016-08-26 DIAGNOSIS — R079 Chest pain, unspecified: Secondary | ICD-10-CM | POA: Diagnosis not present

## 2016-08-26 DIAGNOSIS — I5030 Unspecified diastolic (congestive) heart failure: Secondary | ICD-10-CM | POA: Insufficient documentation

## 2016-08-26 NOTE — Progress Notes (Signed)
*  PRELIMINARY RESULTS* Echocardiogram 2D Echocardiogram has been performed.  [name] 08/26/2016, 10:33 AM

## 2016-08-28 ENCOUNTER — Telehealth: Payer: Self-pay | Admitting: *Deleted

## 2016-08-28 ENCOUNTER — Encounter: Payer: Self-pay | Admitting: *Deleted

## 2016-08-28 DIAGNOSIS — R0789 Other chest pain: Secondary | ICD-10-CM

## 2016-08-28 NOTE — Telephone Encounter (Signed)
-----   Message from Antoine Poche, MD sent at 08/27/2016  3:35 PM EDT ----- Echo looks good, can we order an exercise stress echo for her please for chest pain, does not need to hold any meds    JBrancH MD

## 2016-08-28 NOTE — Telephone Encounter (Signed)
Patient informed and verbalized understanding of plan. 

## 2016-09-08 ENCOUNTER — Ambulatory Visit (HOSPITAL_COMMUNITY)
Admission: RE | Admit: 2016-09-08 | Discharge: 2016-09-08 | Disposition: A | Discharge status: Home / Self Care | Payer: BLUE CROSS/BLUE SHIELD | Source: Ambulatory Visit | Attending: Cardiology | Admitting: Cardiology

## 2016-09-08 DIAGNOSIS — R0789 Other chest pain: Secondary | ICD-10-CM | POA: Diagnosis not present

## 2016-09-08 LAB — ECHOCARDIOGRAM STRESS TEST
CSEPED: 2 min
Estimated workload: 4.6 METS
Exercise duration (sec): 59 s
MPHR: 169 {beats}/min
Peak HR: 144 {beats}/min
Percent HR: 85 %
RPE: 13
Rest HR: 116 {beats}/min

## 2016-09-08 NOTE — Progress Notes (Signed)
*  PRELIMINARY RESULTS* Echocardiogram Echocardiogram Stress Test has been performed.  [name] 09/08/2016, 11:58 AM

## 2016-09-11 ENCOUNTER — Telehealth: Payer: Self-pay

## 2016-09-11 NOTE — Telephone Encounter (Signed)
-----   Message from Antoine Poche, MD sent at 09/11/2016 10:41 AM EDT ----- Stress test looks good, we will discuss in detail at our upcoming f/u  Dominga Ferry MD

## 2016-09-11 NOTE — Telephone Encounter (Signed)
Patient notified. Routed to PCP 

## 2016-09-11 NOTE — Telephone Encounter (Signed)
LMTCB

## 2016-09-11 NOTE — Telephone Encounter (Signed)
-----   Message from Jonathan F Branch, MD sent at 09/11/2016 10:41 AM EDT ----- Stress test looks good, we will discuss in detail at our upcoming f/u  J Branch MD 

## 2016-09-25 ENCOUNTER — Encounter: Payer: Self-pay | Admitting: *Deleted

## 2016-09-29 ENCOUNTER — Ambulatory Visit (INDEPENDENT_AMBULATORY_CARE_PROVIDER_SITE_OTHER): Payer: BLUE CROSS/BLUE SHIELD | Admitting: Cardiology

## 2016-09-29 ENCOUNTER — Encounter: Payer: Self-pay | Admitting: Cardiology

## 2016-09-29 VITALS — BP 102/72 | HR 114 | Ht 65.0 in | Wt 216.0 lb

## 2016-09-29 DIAGNOSIS — R0789 Other chest pain: Secondary | ICD-10-CM

## 2016-09-29 NOTE — Patient Instructions (Signed)
Your physician wants you to follow-up in:  6 months with Dr Lurena Joiner will receive a reminder letter in the mail two months in advance. If you don't receive a letter, please call our office to schedule the follow-up appointment     Your physician recommends that you continue on your current medications as directed. Please refer to the Current Medication list given to you today.    No labs or testing ordered today      Thank you for choosing [facility] !

## 2016-09-29 NOTE — Progress Notes (Signed)
Clinical Summary Ms. Thompsen is a 51 y.o.female seen today for follow up of the following medical problems. This is a focsued visit on her recent symptoms of chest pain.   1. Chest pain - seen in 2014 by Dr Eden Emms. Around that time CT PE negative.  - reported history of esophageal spasms with prior dilatations  - last visit reported presusre like painmin midchest. Can occur at rest or with activity. 8/10 in severity. Can feel lightheaded. Not positional. Not related to food. Lasts up to 1 hour. Occurs 3 times a week - DOE with activities.   - 08/2016 exercise stress echo: no ischemia - 07/2016 echo LVEF 55-60%, no WMAs, grade I diastolic dysfunction - no recent chest pain.     Past Medical History:  Diagnosis Date  . Asthma   . Chronic abdominal pain   . Chronic neck pain    C4-6 fusion  . CONSTIPATION 05/16/2009   Qualifier: Diagnosis of  By: Yetta Barre FNP-BC, Kandice L   . Diabetes mellitus without complication (HCC)   . Dysphagia 02/23/2012  . Fatty liver   . GERD (gastroesophageal reflux disease)   . Helicobacter pylori gastritis 2011  . LIVER FUNCTION TESTS, ABNORMAL, HX OF 05/14/2009   Qualifier: Diagnosis of  By: Ricard Dillon    . Migraine headache   . MIGRAINE, COMMON 05/14/2009   Qualifier: Diagnosis of  By: Ricard Dillon    . MS (multiple sclerosis) (HCC)   . Sleep apnea      Allergies  Allergen Reactions  . Peanut-Containing Drug Products Anaphylaxis and Hives    Patient states she is allergic to all nuts  . Gluten Meal   . Latex Other (See Comments)    Reaction: blistering  . Prednisone Hives and Other (See Comments)    Reaction: causes psychological issues   . Nexium [Esomeprazole Magnesium] Hives  . Tape Rash    Paper tape please     Current Outpatient Prescriptions  Medication Sig Dispense Refill  . albuterol (PROVENTIL HFA;VENTOLIN HFA) 108 (90 BASE) MCG/ACT inhaler Inhale 2 puffs into the lungs every 6 (six) hours as needed. For Asthma      . baclofen (LIORESAL) 20 MG tablet Take 20 mg by mouth 3 (three) times daily.   3  . citalopram (CELEXA) 40 MG tablet Take 40 mg by mouth every evening.     Marland Kitchen COPAXONE 40 MG/ML SOSY Inject into the muscle 3 (three) times a week.    . gabapentin (NEURONTIN) 300 MG capsule Take 600 mg by mouth 3 (three) times daily.     Marland Kitchen lisinopril (PRINIVIL,ZESTRIL) 2.5 MG tablet Take 2.5 mg by mouth daily.    Marland Kitchen oxybutynin (DITROPAN) 5 MG tablet Take 5 mg by mouth 2 (two) times daily.    . Oxycodone HCl 10 MG TABS Take 10 mg by mouth every 4 (four) hours as needed (for pain).      No current facility-administered medications for this visit.      Past Surgical History:  Procedure Laterality Date  . ABDOMINAL HYSTERECTOMY    . APPENDECTOMY    . BACK SURGERY  03/07/2012   Nerve Stimulator  . CHOLECYSTECTOMY    . COLONOSCOPY  07/09/2009   RMR; normal rectum aside from anal canal hemorrhoids/scattered left-sided diverticula  . ESOPHAGOGASTRODUODENOSCOPY  05/22/2009   RMR; normal /small HH  . ESOPHAGOGASTRODUODENOSCOPY  2007   RMR: non-critical Schatzki's rin, non-maniuplated  . ESOPHAGOGASTRODUODENOSCOPY N/A 10/24/2013   Procedure: ESOPHAGOGASTRODUODENOSCOPY (EGD);  Surgeon:  Corbin Ade, MD;  [facility];  Service: Endoscopy;  Laterality: N/A;  9:45  . ESOPHAGOGASTRODUODENOSCOPY (EGD) WITH ESOPHAGEAL DILATION  03/17/2012   ZOX:[id number] appearing esophageal mucosa of uncertain significance. Status post [name] dilation followed by esophageal bx  . MALONEY DILATION N/A 10/24/2013   Procedure: [name] DILATION;  Surgeon: Corbin Ade, MD;  [facility]: [facility];  Service: Endoscopy;  Laterality: N/A;  . MANDIBLE SURGERY    . NECK SURGERY     X2, last time 2013     Allergies  Allergen Reactions  . Peanut-Containing Drug Products Anaphylaxis and Hives    Patient states she is allergic to all nuts  . Gluten Meal   . Latex Other (See Comments)    Reaction: blistering  . Prednisone  Hives and Other (See Comments)    Reaction: causes psychological issues   . Nexium [Esomeprazole Magnesium] Hives  . Tape Rash    Paper tape please      Family History  Problem Relation Age of Onset  . Cancer Mother        unknown type  . Diabetes Father   . Diabetes Sister   . Colon cancer Neg Hx      Social History Ms. Milks reports that she has never smoked. She does not have any smokeless tobacco history on file. Ms. Lecount reports that she does not drink alcohol.   Review of Systems CONSTITUTIONAL: No weight loss, fever, chills, weakness or fatigue.  HEENT: Eyes: No visual loss, blurred vision, double vision or yellow sclerae.No hearing loss, sneezing, congestion, runny nose or sore throat.  SKIN: No rash or itching.  CARDIOVASCULAR: per hpi RESPIRATORY: No shortness of breath, cough or sputum.  GASTROINTESTINAL: No anorexia, nausea, vomiting or diarrhea. No abdominal pain or blood.  GENITOURINARY: No burning on urination, no polyuria NEUROLOGICAL: No headache, dizziness, syncope, paralysis, ataxia, numbness or tingling in the extremities. No change in bowel or bladder control.  MUSCULOSKELETAL: No muscle, back pain, joint pain or stiffness.  LYMPHATICS: No enlarged nodes. No history of splenectomy.  PSYCHIATRIC: No history of depression or anxiety.  ENDOCRINOLOGIC: No reports of sweating, cold or heat intolerance. No polyuria or polydipsia.  Marland Kitchen   Physical Examination Vitals:   09/29/16 1304  BP: 102/72  Pulse: (!) 114   Vitals:   09/29/16 1304  Weight: 216 lb (98 kg)  Height: 5\' 5"  (1.651 m)    Gen: resting comfortably, no acute distress HEENT: no scleral icterus, pupils equal round and reactive, no palptable cervical adenopathy,  CV: RRR, no mr/g, no jvd Resp: Clear to auscultation bilaterally GI: abdomen is soft, non-tender, non-distended, normal bowel sounds, no hepatosplenomegaly MSK: extremities are warm, no edema.  Skin: warm, no rash Neuro:  no  focal deficits Psych: appropriate affect   Diagnostic Studies  07/2016 echo Study Conclusions  - Left ventricle: The cavity size was normal. Wall thickness was   increased in a pattern of mild LVH. Systolic function was normal.   The estimated ejection fraction was in the range of 55% to 60%.   Wall motion was normal; there were no regional wall motion   abnormalities. Doppler parameters are consistent with abnormal   left ventricular relaxation (grade 1 diastolic dysfunction). - Aortic valve: Valve area (VTI): 3.3 cm^2. Valve area (Vmax): 2.73   cm^2. - Technically adequate study.  08/2016 stress echo Study Conclusions  - Stress ECG conclusions: The stress ECG was normal. - Staged echo: Resting left ventricular systolic function  was   hyperdynamic, LVEF 65-70%. With stress, there was augmented   contractility of all wall segments, LVEF 85%. No evidence of   stress induced ischemia. Normal echo stress  Impressions:  - No evidence of stress induced ischemia. Exercise tolerance was   reduced. Clinical correlation indicated.   Assessment and Plan  1. Chest pain - negative workup recently for CAD, symptoms have resolve - continue to monitor at this time.    F/u 6 months      Antoine Poche, M.D.

## 2017-01-27 LAB — HEMOGLOBIN A1C: Hemoglobin A1C: 14

## 2017-03-15 ENCOUNTER — Ambulatory Visit: Payer: BLUE CROSS/BLUE SHIELD | Admitting: "Endocrinology

## 2017-03-15 ENCOUNTER — Encounter: Payer: Self-pay | Admitting: "Endocrinology

## 2017-03-15 VITALS — BP 127/81 | HR 96 | Ht 65.0 in | Wt 205.0 lb

## 2017-03-15 DIAGNOSIS — Z6835 Body mass index (BMI) 35.0-35.9, adult: Secondary | ICD-10-CM | POA: Diagnosis not present

## 2017-03-15 DIAGNOSIS — I10 Essential (primary) hypertension: Secondary | ICD-10-CM | POA: Insufficient documentation

## 2017-03-15 DIAGNOSIS — E66812 Obesity, class 2: Secondary | ICD-10-CM | POA: Insufficient documentation

## 2017-03-15 DIAGNOSIS — E1165 Type 2 diabetes mellitus with hyperglycemia: Secondary | ICD-10-CM

## 2017-03-15 HISTORY — DX: Obesity, class 2: E66.812

## 2017-03-15 HISTORY — DX: Type 2 diabetes mellitus with hyperglycemia: E11.65

## 2017-03-15 HISTORY — DX: Morbid (severe) obesity due to excess calories: E66.01

## 2017-03-15 MED ORDER — ACCU-CHEK GUIDE W/DEVICE KIT: 1.0000 | PACK | 0 refills | Status: DC

## 2017-03-15 MED ORDER — GLUCOSE BLOOD VI STRP: ORAL_STRIP | 2 refills | Status: DC

## 2017-03-15 NOTE — Patient Instructions (Signed)

## 2017-03-15 NOTE — Progress Notes (Signed)
Consult Note       03/15/2017, 5:05 PM   Subjective:    Patient ID: [name], female    DOB: 09-02-1965.  [name] is being seen in consultation for management of currently uncontrolled symptomatic diabetes requested by  Redmond School, MD.   Past Medical History:  Diagnosis Date  . Asthma   . Chronic abdominal pain   . Chronic neck pain    C4-6 fusion  . CONSTIPATION 05/16/2009   Qualifier: Diagnosis of  By: Ronnald Ramp FNP-BC, Kandice L   . Diabetes mellitus without complication ([place])   . Dysphagia 02/23/2012  . Fatty liver   . GERD (gastroesophageal reflux disease)   . Helicobacter pylori gastritis 2011  . LIVER FUNCTION TESTS, ABNORMAL, HX OF 05/14/2009   Qualifier: Diagnosis of  By: Stephan Minister    . Migraine headache   . MIGRAINE, COMMON 05/14/2009   Qualifier: Diagnosis of  By: Stephan Minister    . MS (multiple sclerosis) ([place])   . Sleep apnea    Past Surgical History:  Procedure Laterality Date  . ABDOMINAL HYSTERECTOMY    . APPENDECTOMY    . BACK SURGERY  03/07/2012   Nerve Stimulator  . CHOLECYSTECTOMY    . COLONOSCOPY  07/09/2009   RMR; normal rectum aside from anal canal hemorrhoids/scattered left-sided diverticula  . ESOPHAGOGASTRODUODENOSCOPY  05/22/2009   RMR; normal /small HH  . ESOPHAGOGASTRODUODENOSCOPY  2007   RMR: non-critical Schatzki's rin, non-maniuplated  . ESOPHAGOGASTRODUODENOSCOPY N/A 10/24/2013   Procedure: ESOPHAGOGASTRODUODENOSCOPY (EGD);  Surgeon: Daneil Dolin, MD;  [facility];  Service: Endoscopy;  Laterality: N/A;  9:45  . ESOPHAGOGASTRODUODENOSCOPY (EGD) WITH ESOPHAGEAL DILATION  03/17/2012   [id number] appearing esophageal mucosa of uncertain significance. Status post Venia Minks dilation followed by esophageal bx  . MALONEY DILATION N/A 10/24/2013   Procedure: Venia Minks DILATION;  Surgeon: Daneil Dolin, MD;  [facility];   Service: Endoscopy;  Laterality: N/A;  . MANDIBLE SURGERY    . NECK SURGERY     X2, last time 2013   Social History   Socioeconomic History  . Marital status: Married    Spouse name: None  . Number of children: None  . Years of education: None  . Highest education level: None  Social Needs  . Financial resource strain: None  . Food insecurity - worry: None  . Food insecurity - inability: None  . Transportation needs - medical: None  . Transportation needs - non-medical: None  Occupational History  . None  Tobacco Use  . Smoking status: Never Smoker  . Smokeless tobacco: Never Used  Substance and Sexual Activity  . Alcohol use: No  . Drug use: No  . Sexual activity: Yes    Birth control/protection: Surgical  Other Topics Concern  . None  Social History Narrative   5 kids, raising grandchild   Outpatient Encounter Medications as of 03/15/2017  Medication Sig  . baclofen (LIORESAL) 20 MG tablet Take 20 mg by mouth 3 (three) times daily.   . citalopram (CELEXA) 40 MG tablet Take 40 mg by mouth every evening.   Marland Kitchen COPAXONE  40 MG/ML SOSY Inject into the muscle 3 (three) times a week.  . gabapentin (NEURONTIN) 300 MG capsule Take 600 mg by mouth 3 (three) times daily.   Marland Kitchen lisinopril (PRINIVIL,ZESTRIL) 2.5 MG tablet Take 2.5 mg by mouth daily.  . metFORMIN (GLUCOPHAGE) 500 MG tablet Take 1,000 mg by mouth 2 (two) times daily.  Marland Kitchen oxybutynin (DITROPAN) 5 MG tablet Take 5 mg by mouth 2 (two) times daily.  . [DISCONTINUED] glipiZIDE (GLUCOTROL) 10 MG tablet Take 10 mg by mouth daily before breakfast.  . Blood Glucose Monitoring Suppl (ACCU-CHEK GUIDE) w/Device KIT 1 Piece by Does not apply route as directed.  Marland Kitchen glucose blood (ACCU-CHEK GUIDE) test strip Use as instructed  . [DISCONTINUED] albuterol (PROVENTIL HFA;VENTOLIN HFA) 108 (90 BASE) MCG/ACT inhaler Inhale 2 puffs into the lungs every 6 (six) hours as needed. For Asthma  . [DISCONTINUED] Oxycodone HCl 10 MG TABS Take 10 mg by  mouth every 4 (four) hours as needed (for pain).    No facility-administered encounter medications on file as of 03/15/2017.     ALLERGIES: Allergies  Allergen Reactions  . Peanut-Containing Drug Products Anaphylaxis and Hives    Patient states she is allergic to all nuts  . Gluten Meal   . Latex Other (See Comments)    Reaction: blistering  . Prednisone Hives and Other (See Comments)    Reaction: causes psychological issues   . Nexium [Esomeprazole Magnesium] Hives  . Tape Rash    Paper tape please    VACCINATION STATUS: Immunization History  Administered Date(s) Administered  . Influenza Split 04/17/2012  . Pneumococcal Polysaccharide-23 04/17/2012    Diabetes  She presents for her initial diabetic visit. She has type 2 diabetes mellitus. Onset time: She was diagnosed at approximate age of 1 years. Her disease course has been worsening. There are no hypoglycemic associated symptoms. Pertinent negatives for hypoglycemia include no confusion, headaches, pallor or seizures. Associated symptoms include blurred vision, fatigue, polydipsia and polyuria. Pertinent negatives for diabetes include no chest pain and no polyphagia. There are no hypoglycemic complications. Symptoms are worsening. There are no diabetic complications. Current diabetic treatments: She is on metformin 1000 mg by mouth twice a day and glipizide 10 mg by mouth daily. (She did not bring any meter nor logs to review today. Her A1c was found to be greater than 14% on 01/27/2017.)  Hypertension  Associated symptoms include blurred vision. Pertinent negatives include no chest pain, headaches, palpitations or shortness of breath.    Review of Systems  Constitutional: Positive for fatigue. Negative for chills, fever and unexpected weight change.  HENT: Negative for trouble swallowing and voice change.   Eyes: Positive for blurred vision. Negative for visual disturbance.  Respiratory: Negative for cough, shortness of  breath and wheezing.   Cardiovascular: Negative for chest pain, palpitations and leg swelling.  Gastrointestinal: Negative for diarrhea, nausea and vomiting.  Endocrine: Positive for polydipsia and polyuria. Negative for cold intolerance, heat intolerance and polyphagia.  Musculoskeletal: Negative for arthralgias and myalgias.  Skin: Negative for color change, pallor, rash and wound.  Neurological: Negative for seizures and headaches.  Psychiatric/Behavioral: Negative for confusion and suicidal ideas.    Objective:    BP 127/81   Pulse 96   Ht 5' 5" (1.651 m)   Wt 205 lb (93 kg)   BMI 34.11 kg/m   Wt Readings from Last 3 Encounters:  03/15/17 205 lb (93 kg)  09/29/16 216 lb (98 kg)  08/21/16 216 lb (98 kg)  Physical Exam  Constitutional: She is oriented to person, place, and time. She appears well-developed.  HENT:  Head: Normocephalic and atraumatic.  Eyes: EOM are normal.  Neck: Normal range of motion. Neck supple. No tracheal deviation present. No thyromegaly present.  Cardiovascular: Normal rate and regular rhythm.  Pulmonary/Chest: Effort normal and breath sounds normal.  Abdominal: Soft. Bowel sounds are normal. There is no tenderness. There is no guarding.  Musculoskeletal: Normal range of motion. She exhibits no edema.  Neurological: She is alert and oriented to person, place, and time. She has normal reflexes. No cranial nerve deficit. Coordination normal.  Skin: Skin is warm and dry. No rash noted. No erythema. No pallor.  Diffuse tattoos  Psychiatric: She has a normal mood and affect. Judgment normal.    CMP     Component Value Date/Time   NA 135 06/26/2014 1702   K 3.9 06/26/2014 1702   CL 95 (L) 06/26/2014 1702   CO2 32 06/26/2014 1702   GLUCOSE 146 (H) 06/26/2014 1702   BUN 11 06/26/2014 1702   CREATININE 0.77 06/26/2014 1702   CALCIUM 8.7 06/26/2014 1702   PROT 8.4 (H) 06/26/2014 1702   ALBUMIN 3.6 06/26/2014 1702   AST 30 06/26/2014 1702   ALT  33 06/26/2014 1702   ALKPHOS 128 (H) 06/26/2014 1702   BILITOT 0.4 06/26/2014 1702   GFRNONAA >90 06/26/2014 1702   GFRAA >90 06/26/2014 1702     Diabetic Labs (most recent): Lab Results  Component Value Date   HGBA1C 14 01/27/2017     Lipid Panel ( most recent) Lipid Panel     Component Value Date/Time   CHOL 232 (A) 07/23/2015   TRIG 181 (A) 07/23/2015   HDL 55 07/23/2015   CHOLHDL 3.5 04/17/2012 0644   VLDL 28 04/17/2012 0644   LDLCALC 141 07/23/2015      Lab Results  Component Value Date   TSH 0.667 04/16/2012         Assessment & Plan:   1. Uncontrolled type 2 diabetes mellitus with hyperglycemia (HCC)  - [name] has currently uncontrolled symptomatic type 2 DM since  51 years of age,  with most recent A1c of . 14%. Recent labs reviewed.  -her diabetes is complicated by obesity/ sedentary life and [name] remains at a high risk for more acute and chronic complications which include CAD, CVA, CKD, retinopathy, and neuropathy. These are all discussed in detail with the patient.  - I have counseled her on diet management and weight loss, by adopting a carbohydrate restricted/protein rich diet.  - Suggestion is made for her to avoid simple carbohydrates  from her diet including Cakes, Sweet Desserts, Ice Cream, Soda (diet and regular), Sweet Tea, Candies, Chips, Cookies, Store Bought Juices, Alcohol in Excess of  1-2 drinks a day, Artificial Sweeteners, and "Sugar-free" Products. This will help patient to have stable blood glucose profile and potentially avoid unintended weight gain.  - I encouraged her to switch to  unprocessed or minimally processed complex starch and increased protein intake (animal or plant source), fruits, and vegetables.  - she is advised to stick to a routine mealtimes to eat 3 meals  a day and avoid unnecessary snacks ( to snack only to correct hypoglycemia).   - she will be scheduled with Jearld Fenton, RDN, CDE for individualized  diabetes education.  - I have approached her with the following individualized plan to manage diabetes and patient agrees:   - Based on her  current presentation, she will likely require insulin treatment to achieve control of diabetes to target. - However, is essential to assure her commitment for proper monitoring her blood glucose for safe use of insulin. - I approached her to initiate strict monitoring of blood glucose 4 times a day-before meals and at bedtime and return in 1 week with her meter and logs. - I have prescribed a new meter supplies.  -Patient is encouraged to call clinic for blood glucose levels less than 70 or above 300 mg /dl. - The meantime, I will continue metformin 1000 g by mouth twice a day, therapeutically suitable for patient . - I will discontinue  glipizide, risk outweighs benefit for this patient.  - she will be considered for incretin therapy as appropriate next visit. - Patient specific target  A1c;  LDL, HDL, Triglycerides, and  Waist Circumference were discussed in detail.  2) BP/HTN:  Controlled. Continue current medications including ACEI/ARB. 3) Lipids/HPL:   Uncontrolled, LDL 141 from April 2018.  She is not on statins, would be  considered for statin therapy next visit.  4)  Weight/Diet: CDE Consult will be initiated , exercise, and detailed carbohydrates information provided.  5) Chronic Care/Health Maintenance:  -she  is on ACEI/ARB and is encouraged to continue to follow up with Ophthalmology, Dentist,  Podiatrist at least yearly or according to recommendations, and advised to   stay away from smoking. I have recommended yearly flu vaccine and pneumonia vaccination at least every 5 years; moderate intensity exercise for up to 150 minutes weekly; and  sleep for at least 7 hours a day.  - I advised patient to maintain close follow up with Redmond School, MD for primary care needs.  - Time spent with the patient: 1 hour, of which >50% was spent in  obtaining information about her symptoms, reviewing her previous labs, evaluations, and treatments, counseling her about her  currently uncontrolled type 2 diabetes, hypertension, and developing a plan for long term treatment; her  questions were answered to her satisfaction.  Follow up plan: - Return in about 1 week (around 03/22/2017) for follow up with meter and logs- no labs.  Glade Lloyd, MD [facility] [address] [place], [place] [address] Phone: [phone]  Fax: [phone]    03/15/2017, 5:05 PM  This note was partially dictated with voice recognition software. Similar sounding words can be transcribed inadequately or may not  be corrected upon review.

## 2017-03-19 ENCOUNTER — Telehealth: Payer: Self-pay

## 2017-03-19 NOTE — Telephone Encounter (Signed)
Pt.notified

## 2017-03-19 NOTE — Telephone Encounter (Signed)
Pt states she has had high BG readings.   Date Before breakfast Before lunch Before supper Bedtime  12/19 [phone]  12/20 [phone]  12/21 308             Pt taking: MTF 1000mg  bid

## 2017-03-19 NOTE — Telephone Encounter (Signed)
Start Glipizide 10mg  once daily with breakfast, continue to monitor 4 times a day,  Keep appointment with logs and meter.

## 2017-03-31 ENCOUNTER — Encounter: Payer: Self-pay | Admitting: "Endocrinology

## 2017-03-31 ENCOUNTER — Ambulatory Visit: Payer: BLUE CROSS/BLUE SHIELD | Admitting: "Endocrinology

## 2017-03-31 VITALS — BP 103/70 | HR 93 | Ht 65.0 in | Wt 202.0 lb

## 2017-03-31 DIAGNOSIS — E1165 Type 2 diabetes mellitus with hyperglycemia: Secondary | ICD-10-CM | POA: Diagnosis not present

## 2017-03-31 DIAGNOSIS — I10 Essential (primary) hypertension: Secondary | ICD-10-CM

## 2017-03-31 DIAGNOSIS — Z6835 Body mass index (BMI) 35.0-35.9, adult: Secondary | ICD-10-CM | POA: Diagnosis not present

## 2017-03-31 MED ORDER — INSULIN PEN NEEDLE 31G X 8 MM MISC: 1.0000 | 3 refills | Status: DC

## 2017-03-31 MED ORDER — INSULIN DEGLUDEC 100 UNIT/ML ~~LOC~~ SOPN: 30.0000 [IU] | PEN_INJECTOR | Freq: Every day | SUBCUTANEOUS | 2 refills | Status: DC

## 2017-03-31 NOTE — Progress Notes (Signed)
Consult Note       03/31/2017, 12:34 PM   Subjective:    Patient ID: [name], female    DOB: 1966/02/12.  Metta C Sloan is being seen in consultation for management of currently uncontrolled symptomatic diabetes requested by  Redmond School, MD.   Past Medical History:  Diagnosis Date  . Asthma   . Chronic abdominal pain   . Chronic neck pain    C4-6 fusion  . CONSTIPATION 05/16/2009   Qualifier: Diagnosis of  By: Ronnald Ramp FNP-BC, Kandice L   . Diabetes mellitus without complication ([place])   . Dysphagia 02/23/2012  . Fatty liver   . GERD (gastroesophageal reflux disease)   . Helicobacter pylori gastritis 2011  . LIVER FUNCTION TESTS, ABNORMAL, HX OF 05/14/2009   Qualifier: Diagnosis of  By: Stephan Minister    . Migraine headache   . MIGRAINE, COMMON 05/14/2009   Qualifier: Diagnosis of  By: Stephan Minister    . MS (multiple sclerosis) ([place])   . Sleep apnea    Past Surgical History:  Procedure Laterality Date  . ABDOMINAL HYSTERECTOMY    . APPENDECTOMY    . BACK SURGERY  03/07/2012   Nerve Stimulator  . CHOLECYSTECTOMY    . COLONOSCOPY  07/09/2009   RMR; normal rectum aside from anal canal hemorrhoids/scattered left-sided diverticula  . ESOPHAGOGASTRODUODENOSCOPY  05/22/2009   RMR; normal /small HH  . ESOPHAGOGASTRODUODENOSCOPY  2007   RMR: non-critical Schatzki's rin, non-maniuplated  . ESOPHAGOGASTRODUODENOSCOPY N/A 10/24/2013   Procedure: ESOPHAGOGASTRODUODENOSCOPY (EGD);  Surgeon: Daneil Dolin, MD;  [facility];  Service: Endoscopy;  Laterality: N/A;  9:45  . ESOPHAGOGASTRODUODENOSCOPY (EGD) WITH ESOPHAGEAL DILATION  03/17/2012   [id number] appearing esophageal mucosa of uncertain significance. Status post Venia Minks dilation followed by esophageal bx  . MALONEY DILATION N/A 10/24/2013   Procedure: Venia Minks DILATION;  Surgeon: Daneil Dolin, MD;  [facility];   Service: Endoscopy;  Laterality: N/A;  . MANDIBLE SURGERY    . NECK SURGERY     X2, last time 2013   Social History   Socioeconomic History  . Marital status: Married    Spouse name: None  . Number of children: None  . Years of education: None  . Highest education level: None  Social Needs  . Financial resource strain: None  . Food insecurity - worry: None  . Food insecurity - inability: None  . Transportation needs - medical: None  . Transportation needs - non-medical: None  Occupational History  . None  Tobacco Use  . Smoking status: Never Smoker  . Smokeless tobacco: Never Used  Substance and Sexual Activity  . Alcohol use: No  . Drug use: No  . Sexual activity: Yes    Birth control/protection: Surgical  Other Topics Concern  . None  Social History Narrative   5 kids, raising grandchild   Outpatient Encounter Medications as of 03/31/2017  Medication Sig  . baclofen (LIORESAL) 20 MG tablet Take 20 mg by mouth 3 (three) times daily.   . Blood Glucose Monitoring Suppl (ACCU-CHEK GUIDE) w/Device KIT 1 Piece by Does not apply route as  directed.  . citalopram (CELEXA) 40 MG tablet Take 40 mg by mouth every evening.   Marland Kitchen COPAXONE 40 MG/ML SOSY Inject into the muscle 3 (three) times a week.  . gabapentin (NEURONTIN) 300 MG capsule Take 600 mg by mouth 3 (three) times daily.   Marland Kitchen glucose blood (ACCU-CHEK GUIDE) test strip Use as instructed  . insulin degludec (TRESIBA FLEXTOUCH) 100 UNIT/ML SOPN FlexTouch Pen Inject 0.3 mLs (30 Units total) into the skin daily at 10 pm.  . Insulin Pen Needle (B-D ULTRAFINE III SHORT PEN) 31G X 8 MM MISC 1 each by Does not apply route as directed.  Marland Kitchen lisinopril (PRINIVIL,ZESTRIL) 2.5 MG tablet Take 2.5 mg by mouth daily.  . metFORMIN (GLUCOPHAGE) 500 MG tablet Take 1,000 mg by mouth 2 (two) times daily.  Marland Kitchen oxybutynin (DITROPAN) 5 MG tablet Take 5 mg by mouth 2 (two) times daily.  . [DISCONTINUED] glipiZIDE (GLUCOTROL) 10 MG tablet Take by mouth  daily.   No facility-administered encounter medications on file as of 03/31/2017.     ALLERGIES: Allergies  Allergen Reactions  . Peanut-Containing Drug Products Anaphylaxis and Hives    Patient states she is allergic to all nuts  . Gluten Meal   . Latex Other (See Comments)    Reaction: blistering  . Prednisone Hives and Other (See Comments)    Reaction: causes psychological issues   . Nexium [Esomeprazole Magnesium] Hives  . Tape Rash    Paper tape please    VACCINATION STATUS: Immunization History  Administered Date(s) Administered  . Influenza Split 04/17/2012  . Pneumococcal Polysaccharide-23 04/17/2012    Diabetes  She presents for her follow-up diabetic visit. She has type 2 diabetes mellitus. Onset time: She was diagnosed at approximate age of 52 years. Her disease course has been worsening. There are no hypoglycemic associated symptoms. Pertinent negatives for hypoglycemia include no confusion, headaches, pallor or seizures. Associated symptoms include blurred vision, fatigue, polydipsia and polyuria. Pertinent negatives for diabetes include no chest pain and no polyphagia. There are no hypoglycemic complications. Symptoms are worsening. There are no diabetic complications. Risk factors for coronary artery disease include diabetes mellitus, hypertension, obesity and sedentary lifestyle. Current diabetic treatments: She is on metformin 1000 mg by mouth twice a day and glipizide 10 mg by mouth daily. Her weight is decreasing steadily. She is following a generally unhealthy diet. When asked about meal planning, she reported none. She has not had a previous visit with a dietitian. She never participates in exercise. Her breakfast blood glucose range is generally >200 mg/dl. Her lunch blood glucose range is generally >200 mg/dl. Her dinner blood glucose range is generally >200 mg/dl. Her bedtime blood glucose range is generally >200 mg/dl. Her overall blood glucose range is >200 mg/dl.  (She came with a meter and logs showing average blood glucose of 343 in the last 7 days. Her A1c was found to be greater than 14% on 01/27/2017.) An ACE inhibitor/angiotensin II receptor blocker is being taken.  Hypertension  This is a chronic problem. The current episode started more than 1 year ago. The problem is controlled. Associated symptoms include blurred vision. Pertinent negatives include no chest pain, headaches, palpitations or shortness of breath. Risk factors for coronary artery disease include obesity, sedentary lifestyle, diabetes mellitus and family history. Past treatments include ACE inhibitors.    Review of Systems  Constitutional: Positive for fatigue. Negative for chills, fever and unexpected weight change.  HENT: Negative for trouble swallowing and voice change.   Eyes:  Positive for blurred vision. Negative for visual disturbance.  Respiratory: Negative for cough, shortness of breath and wheezing.   Cardiovascular: Negative for chest pain, palpitations and leg swelling.  Gastrointestinal: Negative for diarrhea, nausea and vomiting.  Endocrine: Positive for polydipsia and polyuria. Negative for cold intolerance, heat intolerance and polyphagia.  Musculoskeletal: Negative for arthralgias and myalgias.  Skin: Negative for color change, pallor, rash and wound.  Neurological: Negative for seizures and headaches.  Psychiatric/Behavioral: Negative for confusion and suicidal ideas.    Objective:    BP 103/70   Pulse 93   Ht '5\' 5"'  (1.651 m)   Wt 202 lb (91.6 kg)   BMI 33.61 kg/m   Wt Readings from Last 3 Encounters:  03/31/17 202 lb (91.6 kg)  03/15/17 205 lb (93 kg)  09/29/16 216 lb (98 kg)     Physical Exam  Constitutional: She is oriented to person, place, and time. She appears well-developed.  Obese.   HENT:  Head: Normocephalic and atraumatic.  Eyes: EOM are normal.  Neck: Normal range of motion. Neck supple. No tracheal deviation present. No thyromegaly  present.  Cardiovascular: Normal rate and regular rhythm.  Pulmonary/Chest: No respiratory distress.  Abdominal: Soft. Bowel sounds are normal. There is no tenderness. There is no guarding.  Musculoskeletal: Normal range of motion. She exhibits no edema.  Neurological: She is alert and oriented to person, place, and time. She has normal reflexes. No cranial nerve deficit. Coordination normal.  Skin: Skin is warm and dry. No rash noted. No erythema. No pallor.  Diffuse tattoos  Psychiatric: She has a normal mood and affect.    CMP     Component Value Date/Time   NA 135 06/26/2014 1702   K 3.9 06/26/2014 1702   CL 95 (L) 06/26/2014 1702   CO2 32 06/26/2014 1702   GLUCOSE 146 (H) 06/26/2014 1702   BUN 11 06/26/2014 1702   CREATININE 0.77 06/26/2014 1702   CALCIUM 8.7 06/26/2014 1702   PROT 8.4 (H) 06/26/2014 1702   ALBUMIN 3.6 06/26/2014 1702   AST 30 06/26/2014 1702   ALT 33 06/26/2014 1702   ALKPHOS 128 (H) 06/26/2014 1702   BILITOT 0.4 06/26/2014 1702   GFRNONAA >90 06/26/2014 1702   GFRAA >90 06/26/2014 1702     Diabetic Labs (most recent): Lab Results  Component Value Date   HGBA1C 14 01/27/2017     Lipid Panel ( most recent) Lipid Panel     Component Value Date/Time   CHOL 232 (A) 07/23/2015   TRIG 181 (A) 07/23/2015   HDL 55 07/23/2015   CHOLHDL 3.5 04/17/2012 0644   VLDL 28 04/17/2012 0644   LDLCALC 141 07/23/2015      Lab Results  Component Value Date   TSH 0.667 04/16/2012       Assessment & Plan:   1. Uncontrolled type 2 diabetes mellitus with hyperglycemia (HCC)  - Jetaun C Cunliffe has currently uncontrolled symptomatic type 2 DM since  52 years of age. - She came with persistent and severe hyperglycemia averaging 10-43 the last 7 days.  Her most recent A1c of  14%. Recent labs reviewed.  -her diabetes is complicated by obesity/ sedentary life and Dalyla C Antwine remains at a high risk for more acute and chronic complications which include CAD, CVA,  CKD, retinopathy, and neuropathy. These are all discussed in detail with the patient.  - I have counseled her on diet management and weight loss, by adopting a carbohydrate restricted/protein rich diet.  -  Suggestion is made for her to avoid simple carbohydrates  from her diet including Cakes, Sweet Desserts / Pastries, Ice Cream, Soda (diet and regular), Sweet Tea, Candies, Chips, Cookies, Store Bought Juices, Alcohol in Excess of  1-2 drinks a day, Artificial Sweeteners, and "Sugar-free" Products. This will help patient to have stable blood glucose profile and potentially avoid unintended weight gain.  - I encouraged her to switch to  unprocessed or minimally processed complex starch and increased protein intake (animal or plant source), fruits, and vegetables.  - she is advised to stick to a routine mealtimes to eat 3 meals  a day and avoid unnecessary snacks ( to snack only to correct hypoglycemia).   - she will be scheduled with Jearld Fenton, RDN, CDE for individualized diabetes education- consult is pending.  - I have approached her with the following individualized plan to manage diabetes and patient agrees:   - Based on her current presentation with persistent severe hyperglycemia, she will  require insulin treatment to achieve control of diabetes to target. - I discussed and initiated basal insulin with Tyler Aas ( Toujeo samples given) , 30 units daily at bedtime associated with strict monitoring of blood glucose 4 times a day-before meals and at bedtime and return in 1 week with her meter and logs.  -Patient is encouraged to call clinic for blood glucose levels less than 70 or above 300 mg /dl. - In the  meantime, I will continue metformin 1000 g by mouth twice a day, therapeutically suitable for patient . - I will discontinue  glipizide, risk outweighs benefit for this patient.  - she will be considered for incretin therapy as appropriate next visit. - Patient specific target  A1c;   LDL, HDL, Triglycerides, and  Waist Circumference were discussed in detail.  2) BP/HTN:  Controlled, I advised her to continue her current blood pressure medications including ACEI/ARB. 3) Lipids/HPL:   Uncontrolled, LDL 141 from April 2018.  She is not on statins, would be  considered for statin therapy next visit.  4)  Weight/Diet: CDE Consult is pending  , exercise, and detailed carbohydrates information provided.  5) Chronic Care/Health Maintenance:  -she  is on ACEI/ARB and is encouraged to continue to follow up with Ophthalmology, Dentist,  Podiatrist at least yearly or according to recommendations, and advised to   stay away from smoking. I have recommended yearly flu vaccine and pneumonia vaccination at least every 5 years; moderate intensity exercise for up to 150 minutes weekly; and  sleep for at least 7 hours a day.  - I advised patient to maintain close follow up with Redmond School, MD for primary care needs.  - Time spent with the patient: 25 min, of which >50% was spent in reviewing her blood glucose logs , discussing her hypo- and hyper-glycemic episodes, reviewing her current and  previous labs and insulin doses and developing a plan to avoid hypo- and hyper-glycemia. Please refer to Patient Instructions for Blood Glucose Monitoring and Insulin/Medications Dosing Guide"  in media tab for additional information.   Follow up plan: - Return in about 1 week (around 04/07/2017) for follow up with meter and logs- no labs.  Glade Lloyd, MD [facility] [address] [place], [place] [address] Phone: [phone]  Fax: [phone]    03/31/2017, 12:34 PM  This note was partially dictated with voice recognition software. Similar sounding words can be transcribed inadequately or may not  be corrected upon review.

## 2017-03-31 NOTE — Patient Instructions (Signed)

## 2017-04-06 ENCOUNTER — Ambulatory Visit: Payer: BLUE CROSS/BLUE SHIELD | Admitting: "Endocrinology

## 2017-04-06 ENCOUNTER — Encounter: Payer: Self-pay | Admitting: Cardiology

## 2017-04-06 ENCOUNTER — Encounter: Payer: Self-pay | Admitting: "Endocrinology

## 2017-04-06 ENCOUNTER — Ambulatory Visit: Payer: BLUE CROSS/BLUE SHIELD | Admitting: Cardiology

## 2017-04-06 VITALS — BP 116/78 | HR 98 | Ht 65.0 in | Wt 206.0 lb

## 2017-04-06 VITALS — BP 138/80 | HR 102 | Ht 65.0 in | Wt 203.0 lb

## 2017-04-06 DIAGNOSIS — Z6835 Body mass index (BMI) 35.0-35.9, adult: Secondary | ICD-10-CM

## 2017-04-06 DIAGNOSIS — R0789 Other chest pain: Secondary | ICD-10-CM | POA: Diagnosis not present

## 2017-04-06 DIAGNOSIS — E1165 Type 2 diabetes mellitus with hyperglycemia: Secondary | ICD-10-CM | POA: Diagnosis not present

## 2017-04-06 DIAGNOSIS — I10 Essential (primary) hypertension: Secondary | ICD-10-CM | POA: Diagnosis not present

## 2017-04-06 DIAGNOSIS — Z79899 Other long term (current) drug therapy: Secondary | ICD-10-CM

## 2017-04-06 DIAGNOSIS — E782 Mixed hyperlipidemia: Secondary | ICD-10-CM | POA: Diagnosis not present

## 2017-04-06 MED ORDER — INSULIN DEGLUDEC 100 UNIT/ML ~~LOC~~ SOPN: 60.0000 [IU] | PEN_INJECTOR | Freq: Every day | SUBCUTANEOUS | 2 refills | Status: DC

## 2017-04-06 MED ORDER — PRAVASTATIN SODIUM 20 MG PO TABS: 20.0000 mg | ORAL_TABLET | Freq: Every evening | ORAL | 3 refills | Status: DC

## 2017-04-06 NOTE — Progress Notes (Signed)
Clinical Summary [name] is a 52 y.o.female seen today for follow up of the following medical problem.s   1. Chest pain - seen in 2014 by Dr Johnsie Cancel. Around that time CT PE negative.  - reported history of esophageal spasms with prior dilatations  - 08/2016 exercise stress echo: no ischemia - 07/2016 echo LVEF 55-60%, no WMAs, grade I diastolic dysfunction    - left sided pressure with position changes, no other chest pain.    2. Hyperlipidemia - 06/2016 TC 232 TG 181 HDL 55 LDL 141 - has some elevation liver enzymes. Told has a fatty liver. Has not been on statin.  Past Medical History:  Diagnosis Date  . Asthma   . Chronic abdominal pain   . Chronic neck pain    C4-6 fusion  . CONSTIPATION 05/16/2009   Qualifier: Diagnosis of  By: Ronnald Ramp FNP-BC, Kandice L   . Diabetes mellitus without complication ([place])   . Dysphagia 02/23/2012  . Fatty liver   . GERD (gastroesophageal reflux disease)   . Helicobacter pylori gastritis 2011  . LIVER FUNCTION TESTS, ABNORMAL, HX OF 05/14/2009   Qualifier: Diagnosis of  By: Stephan Minister    . Migraine headache   . MIGRAINE, COMMON 05/14/2009   Qualifier: Diagnosis of  By: Stephan Minister    . MS (multiple sclerosis) ([place])   . Sleep apnea      Allergies  Allergen Reactions  . Peanut-Containing Drug Products Anaphylaxis and Hives    Patient states she is allergic to all nuts  . Gluten Meal   . Latex Other (See Comments)    Reaction: blistering  . Prednisone Hives and Other (See Comments)    Reaction: causes psychological issues   . Nexium [Esomeprazole Magnesium] Hives  . Tape Rash    Paper tape please     Current Outpatient Medications  Medication Sig Dispense Refill  . baclofen (LIORESAL) 20 MG tablet Take 20 mg by mouth 3 (three) times daily.   3  . Blood Glucose Monitoring Suppl (ACCU-CHEK GUIDE) w/Device KIT 1 Piece by Does not apply route as directed. 1 kit 0  . citalopram (CELEXA) 40 MG tablet Take 40 mg by mouth  every evening.     Marland Kitchen COPAXONE 40 MG/ML SOSY Inject into the muscle 3 (three) times a week.    . gabapentin (NEURONTIN) 300 MG capsule Take 600 mg by mouth 3 (three) times daily.     Marland Kitchen glucose blood (ACCU-CHEK GUIDE) test strip Use as instructed 150 each 2  . insulin degludec (TRESIBA FLEXTOUCH) 100 UNIT/ML SOPN FlexTouch Pen Inject 0.6 mLs (60 Units total) into the skin daily at 10 pm. 15 mL 2  . Insulin Pen Needle (B-D ULTRAFINE III SHORT PEN) 31G X 8 MM MISC 1 each by Does not apply route as directed. 100 each 3  . lisinopril (PRINIVIL,ZESTRIL) 2.5 MG tablet Take 2.5 mg by mouth daily.    . metFORMIN (GLUCOPHAGE) 500 MG tablet Take 1,000 mg by mouth 2 (two) times daily.    Marland Kitchen oxybutynin (DITROPAN) 5 MG tablet Take 5 mg by mouth 2 (two) times daily.     No current facility-administered medications for this visit.      Past Surgical History:  Procedure Laterality Date  . ABDOMINAL HYSTERECTOMY    . APPENDECTOMY    . BACK SURGERY  03/07/2012   Nerve Stimulator  . CHOLECYSTECTOMY    . COLONOSCOPY  07/09/2009   RMR; normal rectum aside from anal  canal hemorrhoids/scattered left-sided diverticula  . ESOPHAGOGASTRODUODENOSCOPY  05/22/2009   RMR; normal /small HH  . ESOPHAGOGASTRODUODENOSCOPY  2007   RMR: non-critical Schatzki's rin, non-maniuplated  . ESOPHAGOGASTRODUODENOSCOPY N/A 10/24/2013   Procedure: ESOPHAGOGASTRODUODENOSCOPY (EGD);  Surgeon: Daneil Dolin, MD;  [facility];  Service: Endoscopy;  Laterality: N/A;  9:45  . ESOPHAGOGASTRODUODENOSCOPY (EGD) WITH ESOPHAGEAL DILATION  03/17/2012   [id number]:[id number] appearing esophageal mucosa of uncertain significance. Status post Venia Minks dilation followed by esophageal bx  . MALONEY DILATION N/A 10/24/2013   Procedure: Venia Minks DILATION;  Surgeon: Daneil Dolin, MD;  [facility];  Service: Endoscopy;  Laterality: N/A;  . MANDIBLE SURGERY    . NECK SURGERY     X2, last time 2013     Allergies  Allergen Reactions    . Peanut-Containing Drug Products Anaphylaxis and Hives    Patient states she is allergic to all nuts  . Gluten Meal   . Latex Other (See Comments)    Reaction: blistering  . Prednisone Hives and Other (See Comments)    Reaction: causes psychological issues   . Nexium [Esomeprazole Magnesium] Hives  . Tape Rash    Paper tape please      Family History  Problem Relation Age of Onset  . Cancer Mother        unknown type  . Diabetes Father   . Diabetes Sister   . Colon cancer Neg Hx      Social History Ms. Heacock reports that  has never smoked. she has never used smokeless tobacco. Ms. Savino reports that she does not drink alcohol.   Review of Systems CONSTITUTIONAL: No weight loss, fever, chills, weakness or fatigue.  HEENT: Eyes: No visual loss, blurred vision, double vision or yellow sclerae.No hearing loss, sneezing, congestion, runny nose or sore throat.  SKIN: No rash or itching.  CARDIOVASCULAR: per hpi RESPIRATORY: No shortness of breath, cough or sputum.  GASTROINTESTINAL: No anorexia, nausea, vomiting or diarrhea. No abdominal pain or blood.  GENITOURINARY: No burning on urination, no polyuria NEUROLOGICAL: No headache, dizziness, syncope, paralysis, ataxia, numbness or tingling in the extremities. No change in bowel or bladder control.  MUSCULOSKELETAL: No muscle, back pain, joint pain or stiffness.  LYMPHATICS: No enlarged nodes. No history of splenectomy.  PSYCHIATRIC: No history of depression or anxiety.  ENDOCRINOLOGIC: No reports of sweating, cold or heat intolerance. No polyuria or polydipsia.  Marland Kitchen   Physical Examination Vitals:   04/06/17 1428  BP: 116/78  Pulse: 98  SpO2: 94%   Vitals:   04/06/17 1428  Weight: 206 lb (93.4 kg)  Height: '5\' 5"'$  (1.651 m)    Gen: resting comfortably, no acute distress HEENT: no scleral icterus, pupils equal round and reactive, no palptable cervical adenopathy,  CV: RRR, no m/rg, no jvd Resp: Clear to  auscultation bilaterally GI: abdomen is soft, non-tender, non-distended, normal bowel sounds, no hepatosplenomegaly MSK: extremities are warm, no edema.  Skin: warm, no rash Neuro:  no focal deficits Psych: appropriate affect   Diagnostic Studies  07/2016 echo Study Conclusions  - Left ventricle: The cavity size was normal. Wall thickness was increased in a pattern of mild LVH. Systolic function was normal. The estimated ejection fraction was in the range of 55% to 60%. Wall motion was normal; there were no regional wall motion abnormalities. Doppler parameters are consistent with abnormal left ventricular relaxation (grade 1 diastolic dysfunction). - Aortic valve: Valve area (VTI): 3.3 cm^2. Valve area (Vmax): 2.73 cm^2. - Technically  adequate study.  08/2016 stress echo Study Conclusions  - Stress ECG conclusions: The stress ECG was normal. - Staged echo: Resting left ventricular systolic function was hyperdynamic, LVEF 65-70%. With stress, there was augmented contractility of all wall segments, LVEF 85%. No evidence of stress induced ischemia. Normal echo stress  Impressions:  - No evidence of stress induced ischemia. Exercise tolerance was reduced. Clinical correlation indicated.     Assessment and Plan   1. Chest pain - negative workup recently for CAD - no recent cardiac chest pain symptoms - continue to monitor.   - start ASA 47m daily in setting of DM2 and high risk for cardiovascular events  2. Hyperlipidemia - has not been on statin due to history of fatty liver and elevated LFTs - try low dose pravastatin 233mdaily. Repeat CMET in 3 months.        JoArnoldo LenisM.D.

## 2017-04-06 NOTE — Addendum Note (Signed)
Addended by: Jannifer Franklin A on: 04/06/2017 11:35 AM   Modules accepted: Orders

## 2017-04-06 NOTE — Progress Notes (Signed)
Consult Note       04/06/2017, 10:00 AM   Subjective:    Patient ID: [name], female    DOB: 09/20/1965.  [name] is being seen in consultation for management of currently uncontrolled symptomatic diabetes requested by  Redmond School, MD.   Past Medical History:  Diagnosis Date  . Asthma   . Chronic abdominal pain   . Chronic neck pain    C4-6 fusion  . CONSTIPATION 05/16/2009   Qualifier: Diagnosis of  By: Ronnald Ramp FNP-BC, Kandice L   . Diabetes mellitus without complication ([place])   . Dysphagia 02/23/2012  . Fatty liver   . GERD (gastroesophageal reflux disease)   . Helicobacter pylori gastritis 2011  . LIVER FUNCTION TESTS, ABNORMAL, HX OF 05/14/2009   Qualifier: Diagnosis of  By: Stephan Minister    . Migraine headache   . MIGRAINE, COMMON 05/14/2009   Qualifier: Diagnosis of  By: Stephan Minister    . MS (multiple sclerosis) ([place])   . Sleep apnea    Past Surgical History:  Procedure Laterality Date  . ABDOMINAL HYSTERECTOMY    . APPENDECTOMY    . BACK SURGERY  03/07/2012   Nerve Stimulator  . CHOLECYSTECTOMY    . COLONOSCOPY  07/09/2009   RMR; normal rectum aside from anal canal hemorrhoids/scattered left-sided diverticula  . ESOPHAGOGASTRODUODENOSCOPY  05/22/2009   RMR; normal /small HH  . ESOPHAGOGASTRODUODENOSCOPY  2007   RMR: non-critical Schatzki's rin, non-maniuplated  . ESOPHAGOGASTRODUODENOSCOPY N/A 10/24/2013   Procedure: ESOPHAGOGASTRODUODENOSCOPY (EGD);  Surgeon: Daneil Dolin, MD;  Location: [facility];  Service: Endoscopy;  Laterality: N/A;  9:45  . ESOPHAGOGASTRODUODENOSCOPY (EGD) WITH ESOPHAGEAL DILATION  03/17/2012   XQK:SKSHNGIT appearing esophageal mucosa of uncertain significance. Status post Venia Minks dilation followed by esophageal bx  . MALONEY DILATION N/A 10/24/2013   Procedure: Venia Minks DILATION;  Surgeon: Daneil Dolin, MD;  Location: [facility];   Service: Endoscopy;  Laterality: N/A;  . MANDIBLE SURGERY    . NECK SURGERY     X2, last time 2013   Social History   Socioeconomic History  . Marital status: Married    Spouse name: None  . Number of children: None  . Years of education: None  . Highest education level: None  Social Needs  . Financial resource strain: None  . Food insecurity - worry: None  . Food insecurity - inability: None  . Transportation needs - medical: None  . Transportation needs - non-medical: None  Occupational History  . None  Tobacco Use  . Smoking status: Never Smoker  . Smokeless tobacco: Never Used  Substance and Sexual Activity  . Alcohol use: No  . Drug use: No  . Sexual activity: Yes    Birth control/protection: Surgical  Other Topics Concern  . None  Social History Narrative   5 kids, raising grandchild   Outpatient Encounter Medications as of 04/06/2017  Medication Sig  . baclofen (LIORESAL) 20 MG tablet Take 20 mg by mouth 3 (three) times daily.   . Blood Glucose Monitoring Suppl (ACCU-CHEK GUIDE) w/Device KIT 1 Piece by Does not apply route as  directed.  . citalopram (CELEXA) 40 MG tablet Take 40 mg by mouth every evening.   Marland Kitchen COPAXONE 40 MG/ML SOSY Inject into the muscle 3 (three) times a week.  . gabapentin (NEURONTIN) 300 MG capsule Take 600 mg by mouth 3 (three) times daily.   Marland Kitchen glucose blood (ACCU-CHEK GUIDE) test strip Use as instructed  . insulin degludec (TRESIBA FLEXTOUCH) 100 UNIT/ML SOPN FlexTouch Pen Inject 0.6 mLs (60 Units total) into the skin daily at 10 pm.  . Insulin Pen Needle (B-D ULTRAFINE III SHORT PEN) 31G X 8 MM MISC 1 each by Does not apply route as directed.  Marland Kitchen lisinopril (PRINIVIL,ZESTRIL) 2.5 MG tablet Take 2.5 mg by mouth daily.  . metFORMIN (GLUCOPHAGE) 500 MG tablet Take 1,000 mg by mouth 2 (two) times daily.  Marland Kitchen oxybutynin (DITROPAN) 5 MG tablet Take 5 mg by mouth 2 (two) times daily.  . [DISCONTINUED] insulin degludec (TRESIBA FLEXTOUCH) 100 UNIT/ML  SOPN FlexTouch Pen Inject 0.3 mLs (30 Units total) into the skin daily at 10 pm.   No facility-administered encounter medications on file as of 04/06/2017.     ALLERGIES: Allergies  Allergen Reactions  . Peanut-Containing Drug Products Anaphylaxis and Hives    Patient states she is allergic to all nuts  . Gluten Meal   . Latex Other (See Comments)    Reaction: blistering  . Prednisone Hives and Other (See Comments)    Reaction: causes psychological issues   . Nexium [Esomeprazole Magnesium] Hives  . Tape Rash    Paper tape please    VACCINATION STATUS: Immunization History  Administered Date(s) Administered  . Influenza Split 04/17/2012  . Pneumococcal Polysaccharide-23 04/17/2012    Diabetes  She presents for her follow-up diabetic visit. She has type 2 diabetes mellitus. Onset time: She was diagnosed at approximate age of 89 years. Her disease course has been worsening. There are no hypoglycemic associated symptoms. Pertinent negatives for hypoglycemia include no confusion, headaches, pallor or seizures. Associated symptoms include blurred vision, fatigue, polydipsia and polyuria. Pertinent negatives for diabetes include no chest pain and no polyphagia. There are no hypoglycemic complications. Symptoms are worsening. There are no diabetic complications. Risk factors for coronary artery disease include diabetes mellitus, hypertension, obesity and sedentary lifestyle. Current diabetic treatments: She is on metformin 1000 mg by mouth twice a day and glipizide 10 mg by mouth daily. Her weight is stable. She is following a generally unhealthy diet. When asked about meal planning, she reported none. She has not had a previous visit with a dietitian. She never participates in exercise. Her breakfast blood glucose range is generally >200 mg/dl. Her lunch blood glucose range is generally >200 mg/dl. Her dinner blood glucose range is generally >200 mg/dl. Her bedtime blood glucose range is  generally >200 mg/dl. Her overall blood glucose range is >200 mg/dl. (She came with a meter and logs showing average blood glucose of 333 in the last 7 days. Her  recent A1c was found to be greater than 14% on 01/27/2017.) An ACE inhibitor/angiotensin II receptor blocker is being taken.  Hypertension  This is a chronic problem. The current episode started more than 1 year ago. The problem is controlled. Associated symptoms include blurred vision. Pertinent negatives include no chest pain, headaches, palpitations or shortness of breath. Risk factors for coronary artery disease include obesity, sedentary lifestyle, diabetes mellitus and family history. Past treatments include ACE inhibitors.    Review of Systems  Constitutional: Positive for fatigue. Negative for chills, fever and unexpected  weight change.  HENT: Negative for trouble swallowing and voice change.   Eyes: Positive for blurred vision. Negative for visual disturbance.  Respiratory: Negative for cough, shortness of breath and wheezing.   Cardiovascular: Negative for chest pain, palpitations and leg swelling.  Gastrointestinal: Negative for diarrhea, nausea and vomiting.  Endocrine: Positive for polydipsia and polyuria. Negative for cold intolerance, heat intolerance and polyphagia.  Musculoskeletal: Negative for arthralgias and myalgias.  Skin: Negative for color change, pallor, rash and wound.  Neurological: Negative for seizures and headaches.  Psychiatric/Behavioral: Negative for confusion and suicidal ideas.    Objective:    BP 138/80   Pulse (!) 102   Ht _0  (1.651 m)   Wt 203 lb (92.1 kg)   BMI 33.78 kg/m   Wt Readings from Last 3 Encounters:  04/06/17 203 lb (92.1 kg)  03/31/17 202 lb (91.6 kg)  03/15/17 205 lb (93 kg)     Physical Exam  Constitutional: She is oriented to person, place, and time. She appears well-developed.  Obese.   HENT:  Head: Normocephalic and atraumatic.  Eyes: EOM are normal.  Neck:  Normal range of motion. Neck supple. No tracheal deviation present. No thyromegaly present.  Cardiovascular: Normal rate and regular rhythm.  Pulmonary/Chest: No respiratory distress.  Abdominal: Soft. Bowel sounds are normal. There is no tenderness. There is no guarding.  Musculoskeletal: Normal range of motion. She exhibits no edema.  Neurological: She is alert and oriented to person, place, and time. She has normal reflexes. No cranial nerve deficit. Coordination normal.  Skin: Skin is warm and dry. No rash noted. No erythema. No pallor.  Diffuse tattoos  Psychiatric: She has a normal mood and affect.    CMP     Component Value Date/Time   NA 135 06/26/2014 1702   K 3.9 06/26/2014 1702   CL 95 (L) 06/26/2014 1702   CO2 32 06/26/2014 1702   GLUCOSE 146 (H) 06/26/2014 1702   BUN 11 06/26/2014 1702   CREATININE 0.77 06/26/2014 1702   CALCIUM 8.7 06/26/2014 1702   PROT 8.4 (H) 06/26/2014 1702   ALBUMIN 3.6 06/26/2014 1702   AST 30 06/26/2014 1702   ALT 33 06/26/2014 1702   ALKPHOS 128 (H) 06/26/2014 1702   BILITOT 0.4 06/26/2014 1702   GFRNONAA >90 06/26/2014 1702   GFRAA >90 06/26/2014 1702     Diabetic Labs (most recent): Lab Results  Component Value Date   HGBA1C 14 01/27/2017     Lipid Panel ( most recent) Lipid Panel     Component Value Date/Time   CHOL 232 (A) 07/23/2015   TRIG 181 (A) 07/23/2015   HDL 55 07/23/2015   CHOLHDL 3.5 04/17/2012 0644   VLDL 28 04/17/2012 0644   LDLCALC 141 07/23/2015      Lab Results  Component Value Date   TSH 0.667 04/16/2012       Assessment & Plan:   1. Uncontrolled type 2 diabetes mellitus with hyperglycemia (HCC)  - [name] has currently uncontrolled symptomatic type 2 DM since  52 years of age. - She came with persistent and severe hyperglycemia averaging  334 the last 7 days.  Her most recent A1c of  14%. Recent labs reviewed.  -her diabetes is complicated by obesity/ sedentary life and [name] remains  at a high risk for more acute and chronic complications which include CAD, CVA, CKD, retinopathy, and neuropathy. These are all discussed in detail with the patient.  - I have  counseled her on diet management and weight loss, by adopting a carbohydrate restricted/protein rich diet.  -  Suggestion is made for her to avoid simple carbohydrates  from her diet including Cakes, Sweet Desserts / Pastries, Ice Cream, Soda (diet and regular), Sweet Tea, Candies, Chips, Cookies, Store Bought Juices, Alcohol in Excess of  1-2 drinks a day, Artificial Sweeteners, and "Sugar-free" Products. This will help patient to have stable blood glucose profile and potentially avoid unintended weight gain.  - I encouraged her to switch to  unprocessed or minimally processed complex starch and increased protein intake (animal or plant source), fruits, and vegetables.  - she is advised to stick to a routine mealtimes to eat 3 meals  a day and avoid unnecessary snacks ( to snack only to correct hypoglycemia).   - she will be scheduled with Jearld Fenton, RDN, CDE for individualized diabetes education- consult is pending.  - I have approached her with the following individualized plan to manage diabetes and patient agrees:   - Based on her current presentation with persistent severe hyperglycemia, she may require  intensive treatment with basal/bolus insulin to achieve control of diabetes to target. -  There is room to maximize her basal insulin before considering prandial insulin. I advised her to increase Tresiba to 60 units daily at bedtime associated with strict monitoring of blood glucose 4 times a day-before meals and at bedtime and return in 1 week with her meter and logs.  -Patient is encouraged to call clinic for blood glucose levels less than 70 or above 300 mg /dl. - In the  meantime, I will continue metformin 1000 g by mouth twice a day, therapeutically suitable for patient .  - she will be considered for  incretin therapy as appropriate next visit. - Patient specific target  A1c;  LDL, HDL, Triglycerides, and  Waist Circumference were discussed in detail.  2) BP/HTN:   blood pressure is controlled to target. I advised her to continue her current blood pressure medications including ACEI/ARB. 3) Lipids/HPL:   Uncontrolled, LDL 141 from April 2018.  She is not on statins, would be  considered for statin therapy next visit.  4)  Weight/Diet: CDE Consult is pending  , exercise, and detailed carbohydrates information provided.  5) Chronic Care/Health Maintenance:  -she  is on ACEI/ARB and is encouraged to continue to follow up with Ophthalmology, Dentist,  Podiatrist at least yearly or according to recommendations, and advised to   stay away from smoking. I have recommended yearly flu vaccine and pneumonia vaccination at least every 5 years; moderate intensity exercise for up to 150 minutes weekly; and  sleep for at least 7 hours a day.  - I advised patient to maintain close follow up with Redmond School, MD for primary care needs.  - Time spent with the patient: 25 min, of which >50% was spent in reviewing her blood glucose logs , discussing her hypo- and hyper-glycemic episodes, reviewing her current and  previous labs and insulin doses and developing a plan to avoid hypo- and hyper-glycemia. Please refer to Patient Instructions for Blood Glucose Monitoring and Insulin/Medications Dosing Guide"  in media tab for additional information.  Follow up plan: - Return in about 1 week (around 04/13/2017) for follow up with meter and logs- no labs.  Glade Lloyd, MD [facility] [address] [place], [place] [address] Phone: [phone]  Fax: [phone]    04/06/2017, 10:00 AM  This note was partially dictated  with voice recognition software. Similar sounding words can be transcribed inadequately or may not  be corrected upon review.

## 2017-04-06 NOTE — Patient Instructions (Signed)
Your physician wants you to follow-up in: 6 months with Dr.Branch You will receive a reminder letter in the mail two months in advance. If you don't receive a letter, please call our office to schedule the follow-up appointment.    Take aspirin 81 mg daily   START Pravastatin 20 mg at dinner for cholesterol    In 3 months, get FASTING Cholesterol panel    If you need a refill on your cardiac medications before your next appointment, please call your pharmacy.   No tests ordered today.   Thank you for choosing [facility] !

## 2017-04-06 NOTE — Patient Instructions (Signed)

## 2017-04-09 ENCOUNTER — Encounter: Payer: Self-pay | Admitting: Cardiology

## 2017-04-13 ENCOUNTER — Encounter: Payer: Self-pay | Admitting: "Endocrinology

## 2017-04-13 ENCOUNTER — Ambulatory Visit: Payer: BLUE CROSS/BLUE SHIELD | Admitting: "Endocrinology

## 2017-04-13 VITALS — BP 123/81 | HR 74 | Ht 65.0 in | Wt 206.0 lb

## 2017-04-13 DIAGNOSIS — Z6835 Body mass index (BMI) 35.0-35.9, adult: Secondary | ICD-10-CM

## 2017-04-13 DIAGNOSIS — E1165 Type 2 diabetes mellitus with hyperglycemia: Secondary | ICD-10-CM | POA: Diagnosis not present

## 2017-04-13 DIAGNOSIS — I10 Essential (primary) hypertension: Secondary | ICD-10-CM | POA: Diagnosis not present

## 2017-04-13 MED ORDER — INSULIN ASPART 100 UNIT/ML FLEXPEN: 10.0000 [IU] | PEN_INJECTOR | Freq: Three times a day (TID) | SUBCUTANEOUS | 2 refills | Status: DC

## 2017-04-13 NOTE — Progress Notes (Signed)
Consult Note       04/13/2017, 1:37 PM   Subjective:    Patient ID: [name], female    DOB: 19-Mar-1966.  [name] is being seen in consultation for management of currently uncontrolled symptomatic diabetes requested by  Redmond School, MD.   Past Medical History:  Diagnosis Date  . Asthma   . Chronic abdominal pain   . Chronic neck pain    C4-6 fusion  . CONSTIPATION 05/16/2009   Qualifier: Diagnosis of  By: Ronnald Ramp FNP-BC, Kandice L   . Diabetes mellitus without complication ([place])   . Dysphagia 02/23/2012  . Fatty liver   . GERD (gastroesophageal reflux disease)   . Helicobacter pylori gastritis 2011  . LIVER FUNCTION TESTS, ABNORMAL, HX OF 05/14/2009   Qualifier: Diagnosis of  By: Stephan Minister    . Migraine headache   . MIGRAINE, COMMON 05/14/2009   Qualifier: Diagnosis of  By: Stephan Minister    . MS (multiple sclerosis) ([place])   . Sleep apnea    Past Surgical History:  Procedure Laterality Date  . ABDOMINAL HYSTERECTOMY    . APPENDECTOMY    . BACK SURGERY  03/07/2012   Nerve Stimulator  . CHOLECYSTECTOMY    . COLONOSCOPY  07/09/2009   RMR; normal rectum aside from anal canal hemorrhoids/scattered left-sided diverticula  . ESOPHAGOGASTRODUODENOSCOPY  05/22/2009   RMR; normal /small HH  . ESOPHAGOGASTRODUODENOSCOPY  2007   RMR: non-critical Schatzki's rin, non-maniuplated  . ESOPHAGOGASTRODUODENOSCOPY N/A 10/24/2013   Procedure: ESOPHAGOGASTRODUODENOSCOPY (EGD);  Surgeon: Daneil Dolin, MD;  Location: [facility];  Service: Endoscopy;  Laterality: N/A;  9:45  . ESOPHAGOGASTRODUODENOSCOPY (EGD) WITH ESOPHAGEAL DILATION  03/17/2012   VAP:OLIDCVUD appearing esophageal mucosa of uncertain significance. Status post Venia Minks dilation followed by esophageal bx  . MALONEY DILATION N/A 10/24/2013   Procedure: Venia Minks DILATION;  Surgeon: Daneil Dolin, MD;  Location: [facility];   Service: Endoscopy;  Laterality: N/A;  . MANDIBLE SURGERY    . NECK SURGERY     X2, last time 2013   Social History   Socioeconomic History  . Marital status: Married    Spouse name: None  . Number of children: None  . Years of education: None  . Highest education level: None  Social Needs  . Financial resource strain: None  . Food insecurity - worry: None  . Food insecurity - inability: None  . Transportation needs - medical: None  . Transportation needs - non-medical: None  Occupational History  . None  Tobacco Use  . Smoking status: Never Smoker  . Smokeless tobacco: Never Used  Substance and Sexual Activity  . Alcohol use: No  . Drug use: No  . Sexual activity: Yes    Birth control/protection: Surgical  Other Topics Concern  . None  Social History Narrative   5 kids, raising grandchild   Outpatient Encounter Medications as of 04/13/2017  Medication Sig  . aspirin EC 81 MG tablet Take 81 mg by mouth daily.  . baclofen (LIORESAL) 20 MG tablet Take 20 mg by mouth 3 (three) times daily.   . Blood Glucose Monitoring  Suppl (ACCU-CHEK GUIDE) w/Device KIT 1 Piece by Does not apply route as directed.  . citalopram (CELEXA) 40 MG tablet Take 40 mg by mouth every evening.   Marland Kitchen COPAXONE 40 MG/ML SOSY Inject into the muscle 3 (three) times a week.  . gabapentin (NEURONTIN) 300 MG capsule Take 600 mg by mouth 3 (three) times daily.   Marland Kitchen glucose blood (ACCU-CHEK GUIDE) test strip Use as instructed  . insulin aspart (NOVOLOG FLEXPEN) 100 UNIT/ML FlexPen Inject 10-16 Units into the skin 3 (three) times daily with meals.  . insulin degludec (TRESIBA FLEXTOUCH) 100 UNIT/ML SOPN FlexTouch Pen Inject 0.6 mLs (60 Units total) into the skin daily at 10 pm.  . Insulin Pen Needle (B-D ULTRAFINE III SHORT PEN) 31G X 8 MM MISC 1 each by Does not apply route as directed.  Marland Kitchen lisinopril (PRINIVIL,ZESTRIL) 2.5 MG tablet Take 2.5 mg by mouth daily.  . metFORMIN (GLUCOPHAGE) 500 MG tablet Take 1,000  mg by mouth 2 (two) times daily.  Marland Kitchen oxybutynin (DITROPAN) 5 MG tablet Take 5 mg by mouth 2 (two) times daily.  . pravastatin (PRAVACHOL) 20 MG tablet Take 1 tablet (20 mg total) by mouth every evening.   No facility-administered encounter medications on file as of 04/13/2017.     ALLERGIES: Allergies  Allergen Reactions  . Peanut-Containing Drug Products Anaphylaxis and Hives    Patient states she is allergic to all nuts  . Gluten Meal   . Latex Other (See Comments)    Reaction: blistering  . Prednisone Hives and Other (See Comments)    Reaction: causes psychological issues   . Nexium [Esomeprazole Magnesium] Hives  . Tape Rash    Paper tape please    VACCINATION STATUS: Immunization History  Administered Date(s) Administered  . Influenza Split 04/17/2012  . Pneumococcal Polysaccharide-23 04/17/2012    Diabetes  She presents for her follow-up diabetic visit. She has type 2 diabetes mellitus. Onset time: She was diagnosed at approximate age of 30 years. Her disease course has been worsening. There are no hypoglycemic associated symptoms. Pertinent negatives for hypoglycemia include no confusion, headaches, pallor or seizures. Associated symptoms include blurred vision, fatigue, polydipsia and polyuria. Pertinent negatives for diabetes include no chest pain and no polyphagia. There are no hypoglycemic complications. Symptoms are improving. There are no diabetic complications. Risk factors for coronary artery disease include diabetes mellitus, hypertension, obesity and sedentary lifestyle. Current diabetic treatments: She is on metformin 1000 mg by mouth twice a day and glipizide 10 mg by mouth daily. Her weight is increasing steadily. She is following a generally unhealthy diet. When asked about meal planning, she reported none. She has not had a previous visit with a dietitian. She never participates in exercise. Her breakfast blood glucose range is generally 180-200 mg/dl. Her lunch  blood glucose range is generally >200 mg/dl. Her dinner blood glucose range is generally >200 mg/dl. Her bedtime blood glucose range is generally >200 mg/dl. Her overall blood glucose range is >200 mg/dl. (She came with a meter and logs showing average blood glucose of 333 in the last 7 days. Her  recent A1c was found to be greater than 14% on 01/27/2017.) An ACE inhibitor/angiotensin II receptor blocker is being taken.  Hypertension  This is a chronic problem. The current episode started more than 1 year ago. The problem is controlled. Associated symptoms include blurred vision. Pertinent negatives include no chest pain, headaches, palpitations or shortness of breath. Risk factors for coronary artery disease include obesity, sedentary lifestyle,  diabetes mellitus and family history. Past treatments include ACE inhibitors.    Review of Systems  Constitutional: Positive for fatigue. Negative for chills, fever and unexpected weight change.  HENT: Negative for trouble swallowing and voice change.   Eyes: Positive for blurred vision. Negative for visual disturbance.  Respiratory: Negative for cough, shortness of breath and wheezing.   Cardiovascular: Negative for chest pain, palpitations and leg swelling.  Gastrointestinal: Negative for diarrhea, nausea and vomiting.  Endocrine: Positive for polydipsia and polyuria. Negative for cold intolerance, heat intolerance and polyphagia.  Musculoskeletal: Negative for arthralgias and myalgias.  Skin: Negative for color change, pallor, rash and wound.  Neurological: Negative for seizures and headaches.  Psychiatric/Behavioral: Negative for confusion and suicidal ideas.    Objective:    BP 123/81   Pulse 74   Ht '5\' 5"'  (1.651 m)   Wt 206 lb (93.4 kg)   BMI 34.28 kg/m   Wt Readings from Last 3 Encounters:  04/13/17 206 lb (93.4 kg)  04/06/17 206 lb (93.4 kg)  04/06/17 203 lb (92.1 kg)     Physical Exam  Constitutional: She is oriented to person,  place, and time. She appears well-developed.  Obese.   HENT:  Head: Normocephalic and atraumatic.  Eyes: EOM are normal.  Neck: Normal range of motion. Neck supple. No tracheal deviation present. No thyromegaly present.  Cardiovascular: Normal rate and regular rhythm.  Pulmonary/Chest: No respiratory distress.  Abdominal: Soft. Bowel sounds are normal. There is no tenderness. There is no guarding.  Musculoskeletal: Normal range of motion. She exhibits no edema.  Neurological: She is alert and oriented to person, place, and time. She has normal reflexes. No cranial nerve deficit. Coordination normal.  Skin: Skin is warm and dry. No rash noted. No erythema. No pallor.  Diffuse tattoos  Psychiatric: She has a normal mood and affect.    CMP     Component Value Date/Time   NA 135 06/26/2014 1702   K 3.9 06/26/2014 1702   CL 95 (L) 06/26/2014 1702   CO2 32 06/26/2014 1702   GLUCOSE 146 (H) 06/26/2014 1702   BUN 11 06/26/2014 1702   CREATININE 0.77 06/26/2014 1702   CALCIUM 8.7 06/26/2014 1702   PROT 8.4 (H) 06/26/2014 1702   ALBUMIN 3.6 06/26/2014 1702   AST 30 06/26/2014 1702   ALT 33 06/26/2014 1702   ALKPHOS 128 (H) 06/26/2014 1702   BILITOT 0.4 06/26/2014 1702   GFRNONAA >90 06/26/2014 1702   GFRAA >90 06/26/2014 1702     Diabetic Labs (most recent): Lab Results  Component Value Date   HGBA1C 14 01/27/2017     Lipid Panel ( most recent) Lipid Panel     Component Value Date/Time   CHOL 232 (A) 07/23/2015   TRIG 181 (A) 07/23/2015   HDL 55 07/23/2015   CHOLHDL 3.5 04/17/2012 0644   VLDL 28 04/17/2012 0644   LDLCALC 141 07/23/2015      Lab Results  Component Value Date   TSH 0.667 04/16/2012       Assessment & Plan:   1. Uncontrolled type 2 diabetes mellitus with hyperglycemia (HCC)  - [name] has currently uncontrolled symptomatic type 2 DM since  52 years of age. - She came with persistent and severe hyperglycemia averaging  334 the last 7 days.   Her most recent A1c of  14%. Recent labs reviewed.  -her diabetes is complicated by obesity/ sedentary life and [name] remains at a high risk for  more acute and chronic complications which include CAD, CVA, CKD, retinopathy, and neuropathy. These are all discussed in detail with the patient.  - I have counseled her on diet management and weight loss, by adopting a carbohydrate restricted/protein rich diet.  -  Suggestion is made for her to avoid simple carbohydrates  from her diet including Cakes, Sweet Desserts / Pastries, Ice Cream, Soda (diet and regular), Sweet Tea, Candies, Chips, Cookies, Store Bought Juices, Alcohol in Excess of  1-2 drinks a day, Artificial Sweeteners, and "Sugar-free" Products. This will help patient to have stable blood glucose profile and potentially avoid unintended weight gain.  - I encouraged her to switch to  unprocessed or minimally processed complex starch and increased protein intake (animal or plant source), fruits, and vegetables.  - she is advised to stick to a routine mealtimes to eat 3 meals  a day and avoid unnecessary snacks ( to snack only to correct hypoglycemia).   - she will be scheduled with Jearld Fenton, RDN, CDE for individualized diabetes education- consult is pending.  - I have approached her with the following individualized plan to manage diabetes and patient agrees:   - Based on her current presentation with persistent severe  postprandial hyperglycemia, she will require  intensive treatment with basal/bolus insulin to achieve control of diabetes to target. -  I advised her to continue Tresiba 60 units daily at bedtime, initiate prandial insulin with NovoLog 10-16 units 3 times a day before meals for pre-meal blood glucose readings above 90 mg/dL, associated with strict monitoring of blood glucose 4 times a day-before meals and at bedtime and return in 1 week with her meter and logs.  -Patient is encouraged to call clinic for blood  glucose levels less than 70 or above 300 mg /dl. - In the  meantime, I will continue metformin 1000 g by mouth twice a day, therapeutically suitable for patient .  - she will be considered for incretin therapy as appropriate next visit. - Patient specific target  A1c;  LDL, HDL, Triglycerides, and  Waist Circumference were discussed in detail.  2) BP/HTN: Her blood pressure is controlled to target. I advised her to continue her current blood pressure medications including ACEI/ARB. 3) Lipids/HPL:   Uncontrolled, LDL 141 from April 2018.  She is not on statins, would be  considered for statin therapy next visit.  4)  Weight/Diet: CDE Consult is pending  , exercise, and detailed carbohydrates information provided.  5) Chronic Care/Health Maintenance:  -she  is on ACEI/ARB and is encouraged to continue to follow up with Ophthalmology, Dentist,  Podiatrist at least yearly or according to recommendations, and advised to   stay away from smoking. I have recommended yearly flu vaccine and pneumonia vaccination at least every 5 years; moderate intensity exercise for up to 150 minutes weekly; and  sleep for at least 7 hours a day.  - I advised patient to maintain close follow up with Redmond School, MD for primary care needs.  - Time spent with the patient: 25 min, of which >50% was spent in reviewing her blood glucose logs , discussing her hypo- and hyper-glycemic episodes, reviewing her current and  previous labs and insulin doses and developing a plan to avoid hypo- and hyper-glycemia. Please refer to Patient Instructions for Blood Glucose Monitoring and Insulin/Medications Dosing Guide"  in media tab for additional information.   Follow up plan: - Return in about 2 weeks (around 04/27/2017) for follow up with pre-visit labs, meter, and  logs.  Glade Lloyd, MD [facility] [address] [place], [place] [address] Phone: [phone]  Fax:  [phone]    04/13/2017, 1:37 PM  This note was partially dictated with voice recognition software. Similar sounding words can be transcribed inadequately or may not  be corrected upon review.

## 2017-04-13 NOTE — Patient Instructions (Signed)

## 2017-04-28 ENCOUNTER — Encounter: Payer: BLUE CROSS/BLUE SHIELD | Attending: Internal Medicine | Admitting: Nutrition

## 2017-04-28 ENCOUNTER — Encounter: Payer: Self-pay | Admitting: "Endocrinology

## 2017-04-28 ENCOUNTER — Ambulatory Visit: Payer: BLUE CROSS/BLUE SHIELD | Admitting: "Endocrinology

## 2017-04-28 ENCOUNTER — Encounter: Payer: Self-pay | Admitting: Nutrition

## 2017-04-28 VITALS — BP 117/77 | HR 92 | Ht 65.0 in | Wt 208.0 lb

## 2017-04-28 VITALS — Ht 65.0 in | Wt 208.0 lb

## 2017-04-28 DIAGNOSIS — E669 Obesity, unspecified: Secondary | ICD-10-CM

## 2017-04-28 DIAGNOSIS — I10 Essential (primary) hypertension: Secondary | ICD-10-CM

## 2017-04-28 DIAGNOSIS — IMO0002 Reserved for concepts with insufficient information to code with codable children: Secondary | ICD-10-CM

## 2017-04-28 DIAGNOSIS — E782 Mixed hyperlipidemia: Secondary | ICD-10-CM

## 2017-04-28 DIAGNOSIS — E1165 Type 2 diabetes mellitus with hyperglycemia: Secondary | ICD-10-CM | POA: Diagnosis not present

## 2017-04-28 DIAGNOSIS — E118 Type 2 diabetes mellitus with unspecified complications: Principal | ICD-10-CM

## 2017-04-28 DIAGNOSIS — Z6835 Body mass index (BMI) 35.0-35.9, adult: Secondary | ICD-10-CM

## 2017-04-28 MED ORDER — INSULIN DEGLUDEC 100 UNIT/ML ~~LOC~~ SOPN: 70.0000 [IU] | PEN_INJECTOR | Freq: Every day | SUBCUTANEOUS | 2 refills | Status: DC

## 2017-04-28 NOTE — Progress Notes (Signed)
Diabetes Self-Management Education  Visit Type: First/Initial  Appt. Start Time: 0930 Appt. End Time: 1100  04/28/2017  [name], identified by name and date of birth, is a 52 y.o. female with a diagnosis of Diabetes: Type 2. PMH MS, HTN, Hyperlipidemia, Sleep apnea and Fatty LIver. Here with her husband. She does the cooking and shopping in the home. She eats out about 50% of the time. Cooks some meals at home. She has 17 people living in her house with her and her husband. They are her children and their children that live with them.. Sees Dr. Fransico Him, Endocrinology. Saw him today.  Hasn't had and DM education from CDE/RDN before. Willing to learn and improve her DM.  Current diet is high in fat, sodium and sugar contributing to her poorly controled DM, Obesity, HTN and Hyperlipidemia.  She misunderstood how to use her sliding scale insulin with meals.  Testing 4 times per day.  70 units of Tresiba daily and 10 units plus sliding scale of Novolog with meals. She is going to get labs drawn today.  BS 130's to 250 mg/dl. She will increase her Tresiba to 70 units a day per Dr. Fransico Him. Will come back in 1 week. Lab Results  Component Value Date   HGBA1C 14 01/27/2017    ASSESSMENT  Height 5\' 5"  (1.651 m), weight 208 lb (94.3 kg). Body mass index is 34.61 kg/m.  Diabetes Self-Management Education - 04/28/17 0931      Visit Information   Visit Type  First/Initial      Initial Visit   Diabetes Type  Type 2    Are you currently following a meal plan?  No    Are you taking your medications as prescribed?  Yes    Date Diagnosed  2016      Health Coping   How would you rate your overall health?  Fair      Psychosocial Assessment   Patient Belief/Attitude about Diabetes  Motivated to manage diabetes    Self-care barriers  None    Self-management support  Family    Other persons present  Patient;Spouse/SO    Patient Concerns  Nutrition/Meal planning;Medication;Monitoring;Healthy  Lifestyle;Problem Solving;Weight Control    Special Needs  None    Preferred Learning Style  Auditory;Visual;Hands on    Learning Readiness  Change in progress    What is the last grade level you completed in school?  12      Pre-Education Assessment   Patient understands the diabetes disease and treatment process.  Needs Instruction    Patient understands incorporating nutritional management into lifestyle.  Needs Instruction    Patient undertands incorporating physical activity into lifestyle.  Needs Instruction    Patient understands using medications safely.  Needs Instruction    Patient understands monitoring blood glucose, interpreting and using results  Needs Instruction    Patient understands prevention, detection, and treatment of acute complications.  Needs Instruction    Patient understands prevention, detection, and treatment of chronic complications.  Needs Instruction    Patient understands how to develop strategies to address psychosocial issues.  Needs Instruction    Patient understands how to develop strategies to promote health/change behavior.  Needs Instruction      Complications   Last HgB A1C per patient/outside source  14 %    How often do you check your blood sugar?  3-4 times/day    Fasting Blood glucose range (mg/dL)  357-017    Postprandial Blood glucose range (mg/dL)  180-200    Number of hypoglycemic episodes per month  0    Number of hyperglycemic episodes per week  21    Can you tell when your blood sugar is high?  Yes    What do you do if your blood sugar is high?  nothing    Have you had a dilated eye exam in the past 12 months?  No    Have you had a dental exam in the past 12 months?  No    Are you checking your feet?  Yes    How many days per week are you checking your feet?  7      Dietary Intake   Breakfast  CHeese sandwich Sweet tea    Snack (morning)  water    Lunch  Polish sausage, biscuit, coleslaw 1/c c, sweet tea    Snack (afternoon)   water    Dinner  BLT on sandwich, fries, sweet tea    Snack (evening)  water    Beverage(s)  water 4 bottles , sweet tea      Exercise   Exercise Type  ADL's      Patient Education   Disease state   Definition of diabetes, type 1 and 2, and the diagnosis of diabetes;Factors that contribute to the development of diabetes;Explored patient's options for treatment of their diabetes    Nutrition management   Role of diet in the treatment of diabetes and the relationship between the three main macronutrients and blood glucose level;Carbohydrate counting;Meal timing in regards to the patients' current diabetes medication.;Information on hints to eating out and maintain blood glucose control.;Reviewed blood glucose goals for pre and post meals and how to evaluate the patients' food intake on their blood glucose level.;Meal options for control of blood glucose level and chronic complications.    Physical activity and exercise   Role of exercise on diabetes management, blood pressure control and cardiac health.;Helped patient identify appropriate exercises in relation to his/her diabetes, diabetes complications and other health issue.    Medications  Taught/reviewed insulin injection, site rotation, insulin storage and needle disposal.;Reviewed patients medication for diabetes, action, purpose, timing of dose and side effects.;Reviewed medication adjustment guidelines for hyperglycemia and sick days.    Monitoring  Taught/evaluated SMBG meter.;Taught/discussed recording of test results and interpretation of SMBG.;Identified appropriate SMBG and/or A1C goals.;Daily foot exams;Interpreting lab values - A1C, lipid, urine microalbumina.;Purpose and frequency of SMBG.    Acute complications  Trained/discussed glucagon administration to patient and designated other.;Discussed and identified patients' treatment of hyperglycemia.;Taught treatment of hypoglycemia - the 15 rule.    Chronic complications  Relationship  between chronic complications and blood glucose control;Lipid levels, blood glucose control and heart disease;Dental care;Nephropathy, what it is, prevention of, the use of ACE, ARB's and early detection of through urine microalbumia.;Assessed and discussed foot care and prevention of foot problems;Identified and discussed with patient  current chronic complications;Retinopathy and reason for yearly dilated eye exams;Reviewed with patient heart disease, higher risk of, and prevention    Psychosocial adjustment  Worked with patient to identify barriers to care and solutions    Personal strategies to promote health  Lifestyle issues that need to be addressed for better diabetes care      Individualized Goals (developed by patient)   Nutrition  Follow meal plan discussed;General guidelines for healthy choices and portions discussed;Adjust meds/carbs with exercise as discussed    Physical Activity  Exercise 3-5 times per week;30 minutes per day    Medications  take my medication as prescribed    Monitoring   test my blood glucose as discussed    Reducing Risk  examine blood glucose patterns;get labs drawn;do foot checks daily      Post-Education Assessment   Patient understands the diabetes disease and treatment process.  Needs Instruction    Patient understands incorporating nutritional management into lifestyle.  Needs Instruction    Patient undertands incorporating physical activity into lifestyle.  Needs Instruction    Patient understands using medications safely.  Needs Instruction    Patient understands monitoring blood glucose, interpreting and using results  Needs Instruction    Patient understands prevention, detection, and treatment of acute complications.  Needs Instruction    Patient understands prevention, detection, and treatment of chronic complications.  Needs Instruction    Patient understands how to develop strategies to address psychosocial issues.  Needs Instruction    Patient  understands how to develop strategies to promote health/change behavior.  Needs Instruction      Outcomes   Expected Outcomes  Demonstrated interest in learning. Expect positive outcomes    Future DMSE  2 wks    Program Status  Completed       Individualized Plan for Diabetes Self-Management Training:   Learning Objective:  Patient will have a greater understanding of diabetes self-management. Patient education plan is to attend individual and/or group sessions per assessed needs and concerns.   Plan:   Patient Instructions  Goals 1 Follow MY Plate Eat three meal per day at times discussed. Eat 30-34 grams of carbs per meal 2. Cut out sweet tea, sweets, junk food 3. Cut out salty foods 4. Increase fresh fruits and vegetables. 5. Drink 5-6 bottles of water per day 84-128 oz of water per day 6. Exercise 30 minutes a day.  Test bloods sugars 4 times per day    Expected Outcomes:  Demonstrated interest in learning. Expect positive outcomes  Education material provided: Living Well with DM, Carb counting, meal planning, diabetes guidelines, My Plate.  If problems or questions, patient to contact team via:  Phone and Email  Future DSME appointment: 1 wks

## 2017-04-28 NOTE — Patient Instructions (Signed)

## 2017-04-28 NOTE — Patient Instructions (Signed)
Goals 1 Follow MY Plate Eat three meal per day at times discussed. Eat 30-34 grams of carbs per meal 2. Cut out sweet tea, sweets, junk food 3. Cut out salty foods 4. Increase fresh fruits and vegetables. 5. Drink 5-6 bottles of water per day 84-128 oz of water per day 6. Exercise 30 minutes a day.  Test bloods sugars 4 times per day

## 2017-04-28 NOTE — Progress Notes (Signed)
Endocrinology Follow Up Note                                                                    04/28/2017, 9:27 AM   Subjective:    Patient ID: [name], female    DOB: 1965-11-09.  [name] is being seen in  follow up for management of currently uncontrolled symptomatic diabetes requested by  Redmond School, MD.   Past Medical History:  Diagnosis Date  . Asthma   . Chronic abdominal pain   . Chronic neck pain    C4-6 fusion  . CONSTIPATION 05/16/2009   Qualifier: Diagnosis of  By: Ronnald Ramp FNP-BC, Kandice L   . Diabetes mellitus without complication ([place])   . Dysphagia 02/23/2012  . Fatty liver   . GERD (gastroesophageal reflux disease)   . Helicobacter pylori gastritis 2011  . LIVER FUNCTION TESTS, ABNORMAL, HX OF 05/14/2009   Qualifier: Diagnosis of  By: Stephan Minister    . Migraine headache   . MIGRAINE, COMMON 05/14/2009   Qualifier: Diagnosis of  By: Stephan Minister    . MS (multiple sclerosis) ([place])   . Sleep apnea    Past Surgical History:  Procedure Laterality Date  . ABDOMINAL HYSTERECTOMY    . APPENDECTOMY    . BACK SURGERY  03/07/2012   Nerve Stimulator  . CHOLECYSTECTOMY    . COLONOSCOPY  07/09/2009   RMR; normal rectum aside from anal canal hemorrhoids/scattered left-sided diverticula  . ESOPHAGOGASTRODUODENOSCOPY  05/22/2009   RMR; normal /small HH  . ESOPHAGOGASTRODUODENOSCOPY  2007   RMR: non-critical Schatzki's rin, non-maniuplated  . ESOPHAGOGASTRODUODENOSCOPY N/A 10/24/2013   Procedure: ESOPHAGOGASTRODUODENOSCOPY (EGD);  Surgeon: Daneil Dolin, MD;  Location: [facility];  Service: Endoscopy;  Laterality: N/A;  9:45  . ESOPHAGOGASTRODUODENOSCOPY (EGD) WITH ESOPHAGEAL DILATION  03/17/2012   WPY:[id number] appearing esophageal mucosa of uncertain significance. Status post Venia Minks dilation followed by esophageal bx  . MALONEY DILATION N/A 10/24/2013   Procedure: Venia Minks DILATION;  Surgeon:  Daneil Dolin, MD;  Location: [facility];  Service: Endoscopy;  Laterality: N/A;  . MANDIBLE SURGERY    . NECK SURGERY     X2, last time 2013   Social History   Socioeconomic History  . Marital status: Married    Spouse name: None  . Number of children: None  . Years of education: None  . Highest education level: None  Social Needs  . Financial resource strain: None  . Food insecurity - worry: None  . Food insecurity - inability: None  . Transportation needs - medical: None  . Transportation needs - non-medical: None  Occupational History  . None  Tobacco Use  . Smoking status: Never Smoker  . Smokeless tobacco: Never Used  Substance and Sexual Activity  . Alcohol use: No  . Drug use: No  . Sexual activity: Yes    Birth control/protection: Surgical  Other Topics Concern  . None  Social History Narrative   5 kids, raising grandchild   Outpatient  Encounter Medications as of 04/28/2017  Medication Sig  . aspirin EC 81 MG tablet Take 81 mg by mouth daily.  . baclofen (LIORESAL) 20 MG tablet Take 20 mg by mouth 3 (three) times daily.   . Blood Glucose Monitoring Suppl (ACCU-CHEK GUIDE) w/Device KIT 1 Piece by Does not apply route as directed.  . citalopram (CELEXA) 40 MG tablet Take 40 mg by mouth every evening.   Marland Kitchen COPAXONE 40 MG/ML SOSY Inject into the muscle 3 (three) times a week.  . gabapentin (NEURONTIN) 300 MG capsule Take 600 mg by mouth 3 (three) times daily.   Marland Kitchen glucose blood (ACCU-CHEK GUIDE) test strip Use as instructed  . insulin aspart (NOVOLOG FLEXPEN) 100 UNIT/ML FlexPen Inject 10-16 Units into the skin 3 (three) times daily with meals.  . insulin degludec (TRESIBA FLEXTOUCH) 100 UNIT/ML SOPN FlexTouch Pen Inject 0.7 mLs (70 Units total) into the skin daily at 10 pm.  . Insulin Pen Needle (B-D ULTRAFINE III SHORT PEN) 31G X 8 MM MISC 1 each by Does not apply route as directed.  Marland Kitchen lisinopril (PRINIVIL,ZESTRIL) 2.5 MG tablet Take 2.5 mg by mouth daily.  .  metFORMIN (GLUCOPHAGE) 500 MG tablet Take 1,000 mg by mouth 2 (two) times daily.  Marland Kitchen oxybutynin (DITROPAN) 5 MG tablet Take 5 mg by mouth 2 (two) times daily.  . pravastatin (PRAVACHOL) 20 MG tablet Take 1 tablet (20 mg total) by mouth every evening.  . [DISCONTINUED] insulin degludec (TRESIBA FLEXTOUCH) 100 UNIT/ML SOPN FlexTouch Pen Inject 0.6 mLs (60 Units total) into the skin daily at 10 pm.   No facility-administered encounter medications on file as of 04/28/2017.     ALLERGIES: Allergies  Allergen Reactions  . Peanut-Containing Drug Products Anaphylaxis and Hives    Patient states she is allergic to all nuts  . Gluten Meal   . Latex Other (See Comments)    Reaction: blistering  . Prednisone Hives and Other (See Comments)    Reaction: causes psychological issues   . Nexium [Esomeprazole Magnesium] Hives  . Tape Rash    Paper tape please    VACCINATION STATUS: Immunization History  Administered Date(s) Administered  . Influenza Split 04/17/2012  . Pneumococcal Polysaccharide-23 04/17/2012    Diabetes  She presents for her follow-up diabetic visit. She has type 2 diabetes mellitus. Onset time: She was diagnosed at approximate age of 64 years. Her disease course has been worsening. There are no hypoglycemic associated symptoms. Pertinent negatives for hypoglycemia include no confusion, headaches, pallor or seizures. Associated symptoms include blurred vision, fatigue, polydipsia and polyuria. Pertinent negatives for diabetes include no chest pain and no polyphagia. There are no hypoglycemic complications. Symptoms are worsening. There are no diabetic complications. Risk factors for coronary artery disease include diabetes mellitus, hypertension, obesity and sedentary lifestyle. Her weight is stable. She is following a generally unhealthy diet. When asked about meal planning, she reported none. She has not had a previous visit with a dietitian. She never participates in exercise. Her  breakfast blood glucose range is generally >200 mg/dl. Her lunch blood glucose range is generally >200 mg/dl. Her dinner blood glucose range is generally >200 mg/dl. Her bedtime blood glucose range is generally >200 mg/dl. Her overall blood glucose range is >200 mg/dl. (She made NovoLog dosing errors, kept running postprandial hyperglycemia.. Her  recent A1c was found to be greater than 14% on 01/27/2017.) An ACE inhibitor/angiotensin II receptor blocker is being taken.  Hypertension  This is a chronic problem. The current episode  started more than 1 year ago. The problem is controlled. Associated symptoms include blurred vision. Pertinent negatives include no chest pain, headaches, palpitations or shortness of breath. Risk factors for coronary artery disease include obesity, sedentary lifestyle, diabetes mellitus and family history. Past treatments include ACE inhibitors.    Review of Systems  Constitutional: Positive for fatigue. Negative for chills, fever and unexpected weight change.  HENT: Negative for trouble swallowing and voice change.   Eyes: Positive for blurred vision. Negative for visual disturbance.  Respiratory: Negative for cough, shortness of breath and wheezing.   Cardiovascular: Negative for chest pain, palpitations and leg swelling.  Gastrointestinal: Negative for diarrhea, nausea and vomiting.  Endocrine: Positive for polydipsia and polyuria. Negative for cold intolerance, heat intolerance and polyphagia.  Musculoskeletal: Negative for arthralgias and myalgias.  Skin: Negative for color change, pallor, rash and wound.  Neurological: Negative for seizures and headaches.  Psychiatric/Behavioral: Negative for confusion and suicidal ideas.    Objective:    BP 117/77   Pulse 92   Ht '5\' 5"'  (1.651 m)   Wt 208 lb (94.3 kg)   BMI 34.61 kg/m   Wt Readings from Last 3 Encounters:  04/28/17 208 lb (94.3 kg)  04/13/17 206 lb (93.4 kg)  04/06/17 206 lb (93.4 kg)     Physical  Exam  Constitutional: She is oriented to person, place, and time. She appears well-developed.  Obese.   HENT:  Head: Normocephalic and atraumatic.  Eyes: EOM are normal.  Neck: Normal range of motion. Neck supple. No tracheal deviation present. No thyromegaly present.  Cardiovascular: Normal rate and regular rhythm.  Pulmonary/Chest: No respiratory distress.  Abdominal: Soft. Bowel sounds are normal. There is no tenderness. There is no guarding.  Musculoskeletal: Normal range of motion. She exhibits no edema.  Neurological: She is alert and oriented to person, place, and time. She has normal reflexes. No cranial nerve deficit. Coordination normal.  Skin: Skin is warm and dry. No rash noted. No erythema. No pallor.  Diffuse tattoos  Psychiatric: She has a normal mood and affect.    CMP     Component Value Date/Time   NA 135 06/26/2014 1702   K 3.9 06/26/2014 1702   CL 95 (L) 06/26/2014 1702   CO2 32 06/26/2014 1702   GLUCOSE 146 (H) 06/26/2014 1702   BUN 11 06/26/2014 1702   CREATININE 0.77 06/26/2014 1702   CALCIUM 8.7 06/26/2014 1702   PROT 8.4 (H) 06/26/2014 1702   ALBUMIN 3.6 06/26/2014 1702   AST 30 06/26/2014 1702   ALT 33 06/26/2014 1702   ALKPHOS 128 (H) 06/26/2014 1702   BILITOT 0.4 06/26/2014 1702   GFRNONAA >90 06/26/2014 1702   GFRAA >90 06/26/2014 1702     Diabetic Labs (most recent): Lab Results  Component Value Date   HGBA1C 14 01/27/2017     Lipid Panel ( most recent) Lipid Panel     Component Value Date/Time   CHOL 232 (A) 07/23/2015   TRIG 181 (A) 07/23/2015   HDL 55 07/23/2015   CHOLHDL 3.5 04/17/2012 0644   VLDL 28 04/17/2012 0644   LDLCALC 141 07/23/2015      Lab Results  Component Value Date   TSH 0.667 04/16/2012       Assessment & Plan:   1. Uncontrolled type 2 diabetes mellitus with hyperglycemia (HCC)  - [name] has currently uncontrolled symptomatic type 2 DM since  52 years of age. - She came with persistent and  severe hyperglycemia,  slightly better than before. She made Novolog dosing errors.   Her most recent A1c of  14%. She forgot to do her previsit labs, will do today.  -her diabetes is complicated by obesity/ sedentary life and [name] remains at a high risk for more acute and chronic complications which include CAD, CVA, CKD, retinopathy, and neuropathy. These are all discussed in detail with the patient.  - I have counseled her on diet management and weight loss, by adopting a carbohydrate restricted/protein rich diet.  -  Suggestion is made for her to avoid simple carbohydrates  from her diet including Cakes, Sweet Desserts / Pastries, Ice Cream, Soda (diet and regular), Sweet Tea, Candies, Chips, Cookies, Store Bought Juices, Alcohol in Excess of  1-2 drinks a day, Artificial Sweeteners, and "Sugar-free" Products. This will help patient to have stable blood glucose profile and potentially avoid unintended weight gain.   - I encouraged her to switch to  unprocessed or minimally processed complex starch and increased protein intake (animal or plant source), fruits, and vegetables.  - she is advised to stick to a routine mealtimes to eat 3 meals  a day and avoid unnecessary snacks ( to snack only to correct hypoglycemia).   - she will be scheduled with Jearld Fenton, RDN, CDE for individualized diabetes education- consult is pending.  - I have approached her with the following individualized plan to manage diabetes and patient agrees:   - Based on her current presentation with persistent severe  postprandial hyperglycemia, she will require  intensive treatment with basal/bolus insulin to achieve control of diabetes to target. -  I advised her to increase Tresiba to 70 units daily at bedtime,  redemonstrated on use of  prandial insulin with NovoLog 10-16 units 3 times a day before meals for pre-meal blood glucose readings above 90 mg/dL, associated with strict monitoring of blood glucose 4  times a day.  -Patient is encouraged to call clinic for blood glucose levels less than 70 or above 300 mg /dl. - In the  meantime, I will continue metformin 1000 mg by mouth twice a day, therapeutically suitable for patient .  - she will be considered for incretin therapy as appropriate next visit. - Patient specific target  A1c;  LDL, HDL, Triglycerides, and  Waist Circumference were discussed in detail.  2) BP/HTN: Her blood pressure is controlled to target. I advised her to continue her current blood pressure medications including ACEI/ARB. 3) Lipids/HPL:   Uncontrolled, LDL 141 from April 2018.  She is not on statins, would be  considered for statin therapy next visit.  4)  Weight/Diet: CDE Consult is pending  , exercise, and detailed carbohydrates information provided.  5) Chronic Care/Health Maintenance:  -she  is on ACEI/ARB and is encouraged to continue to follow up with Ophthalmology, Dentist,  Podiatrist at least yearly or according to recommendations, and advised to   stay away from smoking. I have recommended yearly flu vaccine and pneumonia vaccination at least every 5 years; moderate intensity exercise for up to 150 minutes weekly; and  sleep for at least 7 hours a day.  - I advised patient to maintain close follow up with Redmond School, MD for primary care needs.  - Time spent with the patient: 25 min, of which >50% was spent in reviewing her blood glucose logs , discussing her hypo- and hyper-glycemic episodes, reviewing her current and  previous labs and insulin doses and developing a plan to avoid hypo- and hyper-glycemia. Please  refer to Patient Instructions for Blood Glucose Monitoring and Insulin/Medications Dosing Guide"  in media tab for additional information. Follow up plan: - Return in about 1 week (around 05/05/2017) for meter, and logs, labs today.  Glade Lloyd, MD [facility] [address] [place], [place]  [address] Phone: [phone]  Fax: [phone]    04/28/2017, 9:27 AM  This note was partially dictated with voice recognition software. Similar sounding words can be transcribed inadequately or may not  be corrected upon review.

## 2017-04-29 LAB — HEMOGLOBIN A1C
EAG (MMOL/L): 16.5 (calc)
HEMOGLOBIN A1C: 12 %{Hb} — AB (ref <5.7)
Mean Plasma Glucose: 298 (calc)

## 2017-04-29 LAB — LIPID PANEL
CHOL/HDL RATIO: 3.4 (calc) (ref <5.0)
Cholesterol: 200 mg/dL — ABNORMAL HIGH (ref <200)
HDL: 59 mg/dL (ref >50)
LDL Cholesterol (Calc): 116 mg/dL (calc) — ABNORMAL HIGH
NON-HDL CHOLESTEROL (CALC): 141 mg/dL — AB (ref <130)
TRIGLYCERIDES: 137 mg/dL (ref <150)

## 2017-04-29 LAB — COMPLETE METABOLIC PANEL WITH GFR
AG RATIO: 0.9 (calc) — AB (ref 1.0–2.5)
ALT: 47 U/L — AB (ref 6–29)
AST: 46 U/L — AB (ref 10–35)
Albumin: 3.7 g/dL (ref 3.6–5.1)
Alkaline phosphatase (APISO): 117 U/L (ref 33–130)
BUN: 9 mg/dL (ref 7–25)
CALCIUM: 8.8 mg/dL (ref 8.6–10.4)
CO2: 32 mmol/L (ref 20–32)
CREATININE: 0.7 mg/dL (ref 0.50–1.05)
Chloride: 99 mmol/L (ref 98–110)
GFR, EST AFRICAN AMERICAN: 116 mL/min/{1.73_m2} (ref >=60)
GFR, EST NON AFRICAN AMERICAN: 100 mL/min/{1.73_m2} (ref >=60)
GLOBULIN: 4.3 g/dL — AB (ref 1.9–3.7)
Glucose, Bld: 155 mg/dL — ABNORMAL HIGH (ref 65–99)
POTASSIUM: 4 mmol/L (ref 3.5–5.3)
Sodium: 137 mmol/L (ref 135–146)
TOTAL PROTEIN: 8 g/dL (ref 6.1–8.1)
Total Bilirubin: 0.4 mg/dL (ref 0.2–1.2)

## 2017-04-29 LAB — MICROALBUMIN / CREATININE URINE RATIO
CREATININE, URINE: 154 mg/dL (ref 20–275)
Microalb Creat Ratio: 27 mcg/mg creat (ref <30)
Microalb, Ur: 4.2 mg/dL

## 2017-04-29 LAB — TSH: TSH: 0.31 m[IU]/L — AB

## 2017-04-29 LAB — VITAMIN D 25 HYDROXY (VIT D DEFICIENCY, FRACTURES): Vit D, 25-Hydroxy: 13 ng/mL — ABNORMAL LOW (ref 30–100)

## 2017-04-29 LAB — T4, FREE: Free T4: 1.1 ng/dL (ref 0.8–1.8)

## 2017-05-06 ENCOUNTER — Ambulatory Visit: Payer: BLUE CROSS/BLUE SHIELD | Admitting: Nutrition

## 2017-05-06 ENCOUNTER — Ambulatory Visit: Payer: BLUE CROSS/BLUE SHIELD | Admitting: "Endocrinology

## 2017-05-25 ENCOUNTER — Encounter: Payer: BLUE CROSS/BLUE SHIELD | Attending: Internal Medicine | Admitting: Nutrition

## 2017-05-25 ENCOUNTER — Encounter: Payer: Self-pay | Admitting: Nutrition

## 2017-05-25 ENCOUNTER — Encounter: Payer: Self-pay | Admitting: "Endocrinology

## 2017-05-25 ENCOUNTER — Ambulatory Visit (INDEPENDENT_AMBULATORY_CARE_PROVIDER_SITE_OTHER): Payer: BLUE CROSS/BLUE SHIELD | Admitting: "Endocrinology

## 2017-05-25 VITALS — BP 110/75 | HR 99 | Ht 65.0 in | Wt 212.0 lb

## 2017-05-25 VITALS — Wt 212.0 lb

## 2017-05-25 DIAGNOSIS — E782 Mixed hyperlipidemia: Secondary | ICD-10-CM | POA: Diagnosis not present

## 2017-05-25 DIAGNOSIS — E669 Obesity, unspecified: Secondary | ICD-10-CM

## 2017-05-25 DIAGNOSIS — I10 Essential (primary) hypertension: Secondary | ICD-10-CM

## 2017-05-25 DIAGNOSIS — Z6835 Body mass index (BMI) 35.0-35.9, adult: Secondary | ICD-10-CM

## 2017-05-25 DIAGNOSIS — IMO0002 Reserved for concepts with insufficient information to code with codable children: Secondary | ICD-10-CM

## 2017-05-25 DIAGNOSIS — E1165 Type 2 diabetes mellitus with hyperglycemia: Secondary | ICD-10-CM

## 2017-05-25 DIAGNOSIS — E118 Type 2 diabetes mellitus with unspecified complications: Principal | ICD-10-CM

## 2017-05-25 MED ORDER — LANCETS MISC: 1.0000 | 3 refills | Status: DC

## 2017-05-25 MED ORDER — DULAGLUTIDE 0.75 MG/0.5ML ~~LOC~~ SOAJ: 0.7500 mg | SUBCUTANEOUS | 2 refills | Status: DC

## 2017-05-25 MED ORDER — GLUCOSE BLOOD VI STRP: ORAL_STRIP | 2 refills | Status: DC

## 2017-05-25 NOTE — Progress Notes (Signed)
Endocrinology Follow Up Note                                                                    05/25/2017, 3:31 PM   Subjective:    Patient ID: [name], female    DOB: 07/15/1965.  [name] is being seen in  follow up for management of currently uncontrolled symptomatic diabetes requested by  Redmond School, MD.   Past Medical History:  Diagnosis Date  . Asthma   . Chronic abdominal pain   . Chronic neck pain    C4-6 fusion  . CONSTIPATION 05/16/2009   Qualifier: Diagnosis of  By: Ronnald Ramp FNP-BC, Kandice L   . Diabetes mellitus without complication ([place])   . Dysphagia 02/23/2012  . Fatty liver   . GERD (gastroesophageal reflux disease)   . Helicobacter pylori gastritis 2011  . Hyperlipidemia   . Hypertension   . LIVER FUNCTION TESTS, ABNORMAL, HX OF 05/14/2009   Qualifier: Diagnosis of  By: Stephan Minister    . Migraine headache   . MIGRAINE, COMMON 05/14/2009   Qualifier: Diagnosis of  By: Stephan Minister    . MS (multiple sclerosis) ([place])   . Sleep apnea    Past Surgical History:  Procedure Laterality Date  . ABDOMINAL HYSTERECTOMY    . APPENDECTOMY    . BACK SURGERY  03/07/2012   Nerve Stimulator  . CHOLECYSTECTOMY    . COLONOSCOPY  07/09/2009   RMR; normal rectum aside from anal canal hemorrhoids/scattered left-sided diverticula  . ESOPHAGOGASTRODUODENOSCOPY  05/22/2009   RMR; normal /small HH  . ESOPHAGOGASTRODUODENOSCOPY  2007   RMR: non-critical Schatzki's rin, non-maniuplated  . ESOPHAGOGASTRODUODENOSCOPY N/A 10/24/2013   Procedure: ESOPHAGOGASTRODUODENOSCOPY (EGD);  Surgeon: Daneil Dolin, MD;  Location: [facility];  Service: Endoscopy;  Laterality: N/A;  9:45  . ESOPHAGOGASTRODUODENOSCOPY (EGD) WITH ESOPHAGEAL DILATION  03/17/2012   RAQ:[id number] appearing esophageal mucosa of uncertain significance. Status post Venia Minks dilation followed by esophageal bx  . MALONEY DILATION N/A 10/24/2013    Procedure: Venia Minks DILATION;  Surgeon: Daneil Dolin, MD;  Location: [facility];  Service: Endoscopy;  Laterality: N/A;  . MANDIBLE SURGERY    . NECK SURGERY     X2, last time 2013   Social History   Socioeconomic History  . Marital status: Married    Spouse name: None  . Number of children: None  . Years of education: None  . Highest education level: None  Social Needs  . Financial resource strain: None  . Food insecurity - worry: None  . Food insecurity - inability: None  . Transportation needs - medical: None  . Transportation needs - non-medical: None  Occupational History  . None  Tobacco Use  . Smoking status: Never Smoker  . Smokeless tobacco: Never Used  Substance and Sexual Activity  . Alcohol use: No  . Drug use: No  . Sexual activity: Yes    Birth control/protection: Surgical  Other Topics Concern  . None  Social History Narrative  5 kids, raising grandchild   Outpatient Encounter Medications as of 05/25/2017  Medication Sig  . aspirin EC 81 MG tablet Take 81 mg by mouth daily.  . baclofen (LIORESAL) 20 MG tablet Take 20 mg by mouth 3 (three) times daily.   . Blood Glucose Monitoring Suppl (ACCU-CHEK GUIDE) w/Device KIT 1 Piece by Does not apply route as directed.  . citalopram (CELEXA) 40 MG tablet Take 40 mg by mouth every evening.   Marland Kitchen COPAXONE 40 MG/ML SOSY Inject into the muscle 3 (three) times a week.  . Dulaglutide (TRULICITY) 8.34 HD/6.2IW SOPN Inject 0.75 mg into the skin once a week.  . gabapentin (NEURONTIN) 300 MG capsule Take 600 mg by mouth 3 (three) times daily.   Marland Kitchen glucose blood (ACCU-CHEK GUIDE) test strip Use as instructed  . insulin aspart (NOVOLOG FLEXPEN) 100 UNIT/ML FlexPen Inject 10-16 Units into the skin 3 (three) times daily with meals.  . insulin degludec (TRESIBA FLEXTOUCH) 100 UNIT/ML SOPN FlexTouch Pen Inject 0.7 mLs (70 Units total) into the skin daily at 10 pm.  . Insulin Pen Needle (B-D ULTRAFINE III SHORT PEN) 31G X 8 MM  MISC 1 each by Does not apply route as directed.  . Lancets MISC 1 each by Does not apply route as directed.  Marland Kitchen lisinopril (PRINIVIL,ZESTRIL) 2.5 MG tablet Take 2.5 mg by mouth daily.  . metFORMIN (GLUCOPHAGE) 500 MG tablet Take 1,000 mg by mouth 2 (two) times daily.  Marland Kitchen oxybutynin (DITROPAN) 5 MG tablet Take 5 mg by mouth 2 (two) times daily.  . pravastatin (PRAVACHOL) 20 MG tablet Take 1 tablet (20 mg total) by mouth every evening.  . [DISCONTINUED] glucose blood (ACCU-CHEK GUIDE) test strip Use as instructed   No facility-administered encounter medications on file as of 05/25/2017.     ALLERGIES: Allergies  Allergen Reactions  . Peanut-Containing Drug Products Anaphylaxis and Hives    Patient states she is allergic to all nuts  . Gluten Meal   . Latex Other (See Comments)    Reaction: blistering  . Prednisone Hives and Other (See Comments)    Reaction: causes psychological issues   . Nexium [Esomeprazole Magnesium] Hives  . Tape Rash    Paper tape please    VACCINATION STATUS: Immunization History  Administered Date(s) Administered  . Influenza Split 04/17/2012  . Pneumococcal Polysaccharide-23 04/17/2012    Diabetes  She presents for her follow-up diabetic visit. She has type 2 diabetes mellitus. Onset time: She was diagnosed at approximate age of 28 years. Her disease course has been improving. There are no hypoglycemic associated symptoms. Pertinent negatives for hypoglycemia include no confusion, headaches, pallor or seizures. Associated symptoms include blurred vision, fatigue, polydipsia and polyuria. Pertinent negatives for diabetes include no chest pain and no polyphagia. There are no hypoglycemic complications. Symptoms are improving. There are no diabetic complications. Risk factors for coronary artery disease include diabetes mellitus, hypertension, obesity and sedentary lifestyle. Her weight is increasing steadily. She is following a generally unhealthy diet. When asked  about meal planning, she reported none. She has not had a previous visit with a dietitian. She never participates in exercise. Her breakfast blood glucose range is generally 140-180 mg/dl. Her lunch blood glucose range is generally 140-180 mg/dl. Her dinner blood glucose range is generally 140-180 mg/dl. Her bedtime blood glucose range is generally 140-180 mg/dl. Her overall blood glucose range is 140-180 mg/dl. An ACE inhibitor/angiotensin II receptor blocker is being taken.  Hypertension  This is a chronic problem. The  current episode started more than 1 year ago. The problem is controlled. Associated symptoms include blurred vision. Pertinent negatives include no chest pain, headaches, palpitations or shortness of breath. Risk factors for coronary artery disease include obesity, sedentary lifestyle, diabetes mellitus and family history. Past treatments include ACE inhibitors.    Review of Systems  Constitutional: Positive for fatigue. Negative for chills, fever and unexpected weight change.  HENT: Negative for trouble swallowing and voice change.   Eyes: Positive for blurred vision. Negative for visual disturbance.  Respiratory: Negative for cough, shortness of breath and wheezing.   Cardiovascular: Negative for chest pain, palpitations and leg swelling.  Gastrointestinal: Negative for diarrhea, nausea and vomiting.  Endocrine: Positive for polydipsia and polyuria. Negative for cold intolerance, heat intolerance and polyphagia.  Musculoskeletal: Negative for arthralgias and myalgias.  Skin: Negative for color change, pallor, rash and wound.  Neurological: Negative for seizures and headaches.  Psychiatric/Behavioral: Negative for confusion and suicidal ideas.    Objective:    BP 110/75   Pulse 99   Ht 5' 5" (1.651 m)   Wt 212 lb (96.2 kg)   BMI 35.28 kg/m   Wt Readings from Last 3 Encounters:  05/25/17 212 lb (96.2 kg)  05/25/17 212 lb (96.2 kg)  04/28/17 208 lb (94.3 kg)      Physical Exam  Constitutional: She is oriented to person, place, and time. She appears well-developed.  Obese.   HENT:  Head: Normocephalic and atraumatic.  Eyes: EOM are normal.  Neck: Normal range of motion. Neck supple. No tracheal deviation present. No thyromegaly present.  Cardiovascular: Normal rate and regular rhythm.  Pulmonary/Chest: No respiratory distress.  Abdominal: Soft. Bowel sounds are normal. There is no tenderness. There is no guarding.  Musculoskeletal: Normal range of motion. She exhibits no edema.  Neurological: She is alert and oriented to person, place, and time. She has normal reflexes. No cranial nerve deficit. Coordination normal.  Skin: Skin is warm and dry. No rash noted. No erythema. No pallor.  Diffuse tattoos  Psychiatric: She has a normal mood and affect.    CMP     Component Value Date/Time   NA 137 04/28/2017 1101   K 4.0 04/28/2017 1101   CL 99 04/28/2017 1101   CO2 32 04/28/2017 1101   GLUCOSE 155 (H) 04/28/2017 1101   BUN 9 04/28/2017 1101   CREATININE 0.70 04/28/2017 1101   CALCIUM 8.8 04/28/2017 1101   PROT 8.0 04/28/2017 1101   ALBUMIN 3.6 06/26/2014 1702   AST 46 (H) 04/28/2017 1101   ALT 47 (H) 04/28/2017 1101   ALKPHOS 128 (H) 06/26/2014 1702   BILITOT 0.4 04/28/2017 1101   GFRNONAA 100 04/28/2017 1101   GFRAA 116 04/28/2017 1101     Diabetic Labs (most recent): Lab Results  Component Value Date   HGBA1C 12.0 (H) 04/28/2017   HGBA1C 14 01/27/2017     Lipid Panel ( most recent) Lipid Panel     Component Value Date/Time   CHOL 200 (H) 04/28/2017 1101   TRIG 137 04/28/2017 1101   HDL 59 04/28/2017 1101   CHOLHDL 3.4 04/28/2017 1101   VLDL 28 04/17/2012 0644   LDLCALC 141 07/23/2015      Lab Results  Component Value Date   TSH 0.31 (L) 04/28/2017   TSH 0.667 04/16/2012   FREET4 1.1 04/28/2017       Assessment & Plan:   1. Uncontrolled type 2 diabetes mellitus with hyperglycemia ([place])  - [name] has  currently uncontrolled symptomatic type 2 DM since  52 years of age. - She came with currently improved glycemic profile, her A1c still remains high at 12% with no improving from greater than 14%.    -her diabetes is complicated by obesity/ sedentary life and [name] remains at a high risk for more acute and chronic complications which include CAD, CVA, CKD, retinopathy, and neuropathy. These are all discussed in detail with the patient.  - I have counseled her on diet management and weight loss, by adopting a carbohydrate restricted/protein rich diet.  -  Suggestion is made for her to avoid simple carbohydrates  from her diet including Cakes, Sweet Desserts / Pastries, Ice Cream, Soda (diet and regular), Sweet Tea, Candies, Chips, Cookies, Store Bought Juices, Alcohol in Excess of  1-2 drinks a day, Artificial Sweeteners, and "Sugar-free" Products. This will help patient to have stable blood glucose profile and potentially avoid unintended weight gain.  - I encouraged her to switch to  unprocessed or minimally processed complex starch and increased protein intake (animal or plant source), fruits, and vegetables.  - she is advised to stick to a routine mealtimes to eat 3 meals  a day and avoid unnecessary snacks ( to snack only to correct hypoglycemia).   - I have approached her with the following individualized plan to manage diabetes and patient agrees:   - Based on her current presentation, she will continue to require  intensive treatment with basal/bolus insulin to maintain control of diabetes to target. -  I advised her to continue Tresiba  70 units daily at bedtime,   NovoLog 10-16 units 3 times a day before meals for pre-meal blood glucose readings above 90 mg/dL, associated with strict monitoring of blood glucose 4 times a day.  -Patient is encouraged to call clinic for blood glucose levels less than 70 or above 300 mg /dl. - In the  meantime, I will continue metformin 1000 mg by  mouth twice a day, therapeutically suitable for patient . -I discussed and added Trulicity 6.57 mg subcutaneously weekly.  Side effects and precautions discussed with her. -She will benefit from continuous glucose monitoring, will be considered on her next visit. - Patient specific target  A1c;  LDL, HDL, Triglycerides, and  Waist Circumference were discussed in detail.  2) BP/HTN: Her blood pressure is controlled to target.   I advised her to continue her current blood pressure medications including ACEI/ARB. 3) Lipids/HPL:   Uncontrolled, LDL 141 from April 2018.  She was recently initiated on pravastatin 20 mg p.o. nightly.  Side effects and precautions discussed with her.   4)  Weight/Diet: CDE Consult is pending  , exercise, and detailed carbohydrates information provided.  5) Chronic Care/Health Maintenance:  -she  is on ACEI/ARB and is encouraged to continue to follow up with Ophthalmology, Dentist,  Podiatrist at least yearly or according to recommendations, and advised to   stay away from smoking. I have recommended yearly flu vaccine and pneumonia vaccination at least every 5 years; moderate intensity exercise for up to 150 minutes weekly; and  sleep for at least 7 hours a day.  - I advised patient to maintain close follow up with Redmond School, MD for primary care needs.  - Time spent with the patient: 25 min, of which >50% was spent in reviewing her blood glucose logs , discussing her hypo- and hyper-glycemic episodes, reviewing her current and  previous labs and insulin doses and developing a plan to avoid hypo-  and hyper-glycemia. Please refer to Patient Instructions for Blood Glucose Monitoring and Insulin/Medications Dosing Guide"  in media tab for additional information.  Follow up plan: - Return in about 3 months (around 08/22/2017) for meter, and logs.  Glade Lloyd, MD [facility] [address] [place], [place]  [address] Phone: [phone]  Fax: [phone]    05/25/2017, 3:31 PM  This note was partially dictated with voice recognition software. Similar sounding words can be transcribed inadequately or may not  be corrected upon review.

## 2017-05-25 NOTE — Patient Instructions (Addendum)
Goals 1. Take Trulicity once a week 2. Increase fresh fruits and vegetables 3. Increase exercise to 30 minutes a day Get A1C down to 7%. Lose 3-4 lbs per month

## 2017-05-25 NOTE — Patient Instructions (Signed)

## 2017-05-25 NOTE — Progress Notes (Signed)
Diabetes Self-Management Education  Appt. Start Time: 0930 Appt. End Time: 1100  05/25/2017  [name], identified by name and date of birth, is a 52 y.o. female with a diagnosis of Diabetes:  . PMH MS, HTN, Hyperlipidemia, Sleep apnea and Fatty LIver. Here with her husband. She does the cooking and shopping in the home. She eats out about 50% of the time.   BS are still running in the  100- 200's. Saw Dr. Fransico Him today and he will start Trulicity today. WIll stay with 70 units of Tresiba and 10 units of Novolog with meals.  Still needs work on better food choices of more fresh fruits vegetables. Drinking water.   ASSESSMENT  Weight 212 lb (96.2 kg). Body mass index is 35.28 kg/m.   Diabetes Self-Management Education - 05/25/17 0929      Health Coping   How would you rate your overall health?  Good      Complications   Fasting Blood glucose range (mg/dL)  38-101    Postprandial Blood glucose range (mg/dL)  751-025    Number of hypoglycemic episodes per month  0    Number of hyperglycemic episodes per week  10    Are you checking your feet?  Yes    How many days per week are you checking your feet?  7      Dietary Intake   Breakfast  2 eggs and 1 greek yogurt and 1 glass milk    Lunch  toss salad with protein, unsweet tea with lemon    Dinner  Chicken salad and grapes and yogurt and unsweet tea with lemon    Beverage(s)  water, unsweet tea      Subsequent Visit   Since your last visit have you continued or begun to take your medications as prescribed?  Yes    Since your last visit have you had your blood pressure checked?  Yes    Is your most recent blood pressure lower, unchanged, or higher since your last visit?  Unchanged    Since your last visit have you experienced any weight changes?  Gain    Weight Gain (lbs)  4    Since your last visit, are you checking your blood glucose at least once a day?  Yes       Learning Objective:  Patient will have a greater  understanding of diabetes self-management. Patient education plan is to attend individual and/or group sessions per assessed needs and concerns.   Plan:   Patient Instructions  Goals 1. Take Trulicity once a week 2. Increase fresh fruits and vegetables 3. Increase exercise to 30 minutes a day Get A1C down to 7%. Lose 3-4 lbs per month     Expected Outcomes:    Improved diabetes management  Education material provided: My Plate and Carbohydrate counting sheet  If problems or questions, patient to contact team via:  Phone and Email  Future DSME appointment: -   3 months

## 2017-07-07 LAB — COMPREHENSIVE METABOLIC PANEL
AG RATIO: 1.1 (calc) (ref 1.0–2.5)
ALKALINE PHOSPHATASE (APISO): 105 U/L (ref 33–130)
ALT: 47 U/L — AB (ref 6–29)
AST: 41 U/L — AB (ref 10–35)
Albumin: 3.7 g/dL (ref 3.6–5.1)
BUN: 11 mg/dL (ref 7–25)
CALCIUM: 8.6 mg/dL (ref 8.6–10.4)
CHLORIDE: 96 mmol/L — AB (ref 98–110)
CO2: 32 mmol/L (ref 20–32)
Creat: 0.66 mg/dL (ref 0.50–1.05)
GLUCOSE: 387 mg/dL — AB (ref 65–99)
Globulin: 3.5 g/dL (calc) (ref 1.9–3.7)
Potassium: 4.3 mmol/L (ref 3.5–5.3)
Sodium: 133 mmol/L — ABNORMAL LOW (ref 135–146)
Total Bilirubin: 0.3 mg/dL (ref 0.2–1.2)
Total Protein: 7.2 g/dL (ref 6.1–8.1)

## 2017-07-14 ENCOUNTER — Telehealth: Payer: Self-pay | Admitting: "Endocrinology

## 2017-07-14 NOTE — Telephone Encounter (Signed)
[name] is asking for the [name] please advise?

## 2017-07-14 NOTE — Telephone Encounter (Signed)
We will discuss on her next visit.

## 2017-08-03 ENCOUNTER — Other Ambulatory Visit: Payer: Self-pay | Admitting: "Endocrinology

## 2017-08-21 LAB — COMPLETE METABOLIC PANEL WITH GFR
AG RATIO: 1 (calc) (ref 1.0–2.5)
ALT: 52 U/L — AB (ref 6–29)
AST: 43 U/L — ABNORMAL HIGH (ref 10–35)
Albumin: 3.9 g/dL (ref 3.6–5.1)
Alkaline phosphatase (APISO): 118 U/L (ref 33–130)
BUN: 15 mg/dL (ref 7–25)
CALCIUM: 8.9 mg/dL (ref 8.6–10.4)
CHLORIDE: 98 mmol/L (ref 98–110)
CO2: 25 mmol/L (ref 20–32)
Creat: 0.62 mg/dL (ref 0.50–1.05)
GFR, EST NON AFRICAN AMERICAN: 104 mL/min/{1.73_m2} (ref >=60)
GFR, Est African American: 120 mL/min/{1.73_m2} (ref >=60)
Globulin: 3.9 g/dL (calc) — ABNORMAL HIGH (ref 1.9–3.7)
Glucose, Bld: 408 mg/dL — ABNORMAL HIGH (ref 65–99)
POTASSIUM: 4.2 mmol/L (ref 3.5–5.3)
Sodium: 133 mmol/L — ABNORMAL LOW (ref 135–146)
Total Bilirubin: 0.4 mg/dL (ref 0.2–1.2)
Total Protein: 7.8 g/dL (ref 6.1–8.1)

## 2017-08-21 LAB — HEMOGLOBIN A1C
HEMOGLOBIN A1C: 12.9 %{Hb} — AB (ref <5.7)
MEAN PLASMA GLUCOSE: 324 (calc)
eAG (mmol/L): 17.9 (calc)

## 2017-08-24 ENCOUNTER — Encounter: Payer: Self-pay | Admitting: Internal Medicine

## 2017-08-26 ENCOUNTER — Other Ambulatory Visit: Payer: Self-pay

## 2017-08-26 ENCOUNTER — Ambulatory Visit (INDEPENDENT_AMBULATORY_CARE_PROVIDER_SITE_OTHER): Payer: BLUE CROSS/BLUE SHIELD | Admitting: "Endocrinology

## 2017-08-26 ENCOUNTER — Encounter: Payer: Self-pay | Admitting: "Endocrinology

## 2017-08-26 ENCOUNTER — Ambulatory Visit: Payer: BLUE CROSS/BLUE SHIELD | Admitting: Nutrition

## 2017-08-26 VITALS — BP 121/72 | HR 68 | Ht 65.0 in | Wt 204.0 lb

## 2017-08-26 DIAGNOSIS — E1165 Type 2 diabetes mellitus with hyperglycemia: Secondary | ICD-10-CM | POA: Diagnosis not present

## 2017-08-26 DIAGNOSIS — Z6835 Body mass index (BMI) 35.0-35.9, adult: Secondary | ICD-10-CM | POA: Diagnosis not present

## 2017-08-26 DIAGNOSIS — E782 Mixed hyperlipidemia: Secondary | ICD-10-CM

## 2017-08-26 DIAGNOSIS — I10 Essential (primary) hypertension: Secondary | ICD-10-CM | POA: Diagnosis not present

## 2017-08-26 DIAGNOSIS — E559 Vitamin D deficiency, unspecified: Secondary | ICD-10-CM | POA: Insufficient documentation

## 2017-08-26 MED ORDER — INSULIN ASPART 100 UNIT/ML FLEXPEN: 15.0000 [IU] | PEN_INJECTOR | Freq: Three times a day (TID) | SUBCUTANEOUS | 2 refills | Status: DC

## 2017-08-26 MED ORDER — DULAGLUTIDE 1.5 MG/0.5ML ~~LOC~~ SOAJ: 1.5000 mg | SUBCUTANEOUS | 2 refills | Status: DC

## 2017-08-26 MED ORDER — INSULIN DEGLUDEC 200 UNIT/ML ~~LOC~~ SOPN: 90.0000 [IU] | PEN_INJECTOR | Freq: Every day | SUBCUTANEOUS | 2 refills | Status: DC

## 2017-08-26 MED ORDER — GLUCOSE BLOOD VI STRP: 1.0000 | ORAL_STRIP | Freq: Four times a day (QID) | 5 refills | Status: DC

## 2017-08-26 NOTE — Patient Instructions (Signed)

## 2017-08-26 NOTE — Progress Notes (Signed)
Endocrinology Follow Up Note                                                                    08/26/2017, 10:54 AM   Subjective:    Patient ID: [name], female    DOB: Sep 10, 1965.  Mauria C Labarbera is being seen in  follow up for management of currently uncontrolled symptomatic diabetes requested by  Redmond School, MD.   Past Medical History:  Diagnosis Date  . Asthma   . Chronic abdominal pain   . Chronic neck pain    C4-6 fusion  . CONSTIPATION 05/16/2009   Qualifier: Diagnosis of  By: Ronnald Ramp FNP-BC, Kandice L   . Diabetes mellitus without complication ([place])   . Dysphagia 02/23/2012  . Fatty liver   . GERD (gastroesophageal reflux disease)   . Helicobacter pylori gastritis 2011  . Hyperlipidemia   . Hypertension   . LIVER FUNCTION TESTS, ABNORMAL, HX OF 05/14/2009   Qualifier: Diagnosis of  By: Stephan Minister    . Migraine headache   . MIGRAINE, COMMON 05/14/2009   Qualifier: Diagnosis of  By: Stephan Minister    . MS (multiple sclerosis) ([place])   . Sleep apnea    Past Surgical History:  Procedure Laterality Date  . ABDOMINAL HYSTERECTOMY    . APPENDECTOMY    . BACK SURGERY  03/07/2012   Nerve Stimulator  . CHOLECYSTECTOMY    . COLONOSCOPY  07/09/2009   RMR; normal rectum aside from anal canal hemorrhoids/scattered left-sided diverticula  . ESOPHAGOGASTRODUODENOSCOPY  05/22/2009   RMR; normal /small HH  . ESOPHAGOGASTRODUODENOSCOPY  2007   RMR: non-critical Schatzki's rin, non-maniuplated  . ESOPHAGOGASTRODUODENOSCOPY N/A 10/24/2013   Procedure: ESOPHAGOGASTRODUODENOSCOPY (EGD);  Surgeon: Daneil Dolin, MD;  Location: [facility];  Service: Endoscopy;  Laterality: N/A;  9:45  . ESOPHAGOGASTRODUODENOSCOPY (EGD) WITH ESOPHAGEAL DILATION  03/17/2012   [id number] appearing esophageal mucosa of uncertain significance. Status post Venia Minks dilation followed by esophageal bx  . MALONEY DILATION N/A 10/24/2013    Procedure: Venia Minks DILATION;  Surgeon: Daneil Dolin, MD;  Location: [facility];  Service: Endoscopy;  Laterality: N/A;  . MANDIBLE SURGERY    . NECK SURGERY     X2, last time 2013   Social History   Socioeconomic History  . Marital status: Married    Spouse name: Not on file  . Number of children: Not on file  . Years of education: Not on file  . Highest education level: Not on file  Occupational History  . Not on file  Social Needs  . Financial resource strain: Not on file  . Food insecurity:    Worry: Not on file    Inability: Not on file  . Transportation needs:    Medical: Not on file    Non-medical: Not on file  Tobacco Use  . Smoking status: Never Smoker  . Smokeless tobacco: Never Used  Substance and Sexual Activity  . Alcohol use: No  . Drug use: No  . Sexual  activity: Yes    Birth control/protection: Surgical  Lifestyle  . Physical activity:    Days per week: Not on file    Minutes per session: Not on file  . Stress: Not on file  Relationships  . Social connections:    Talks on phone: Not on file    Gets together: Not on file    Attends religious service: Not on file    Active member of club or organization: Not on file    Attends meetings of clubs or organizations: Not on file    Relationship status: Not on file  Other Topics Concern  . Not on file  Social History Narrative   5 kids, raising grandchild   Outpatient Encounter Medications as of 08/26/2017  Medication Sig  . ACCU-CHEK GUIDE test strip TESTING 4 TIMES DAILY.  Marland Kitchen aspirin EC 81 MG tablet Take 81 mg by mouth daily.  . baclofen (LIORESAL) 20 MG tablet Take 20 mg by mouth 3 (three) times daily.   . Blood Glucose Monitoring Suppl (ACCU-CHEK GUIDE) w/Device KIT 1 Piece by Does not apply route as directed.  . citalopram (CELEXA) 40 MG tablet Take 40 mg by mouth every evening.   Marland Kitchen COPAXONE 40 MG/ML SOSY Inject into the muscle 3 (three) times a week.  . Dulaglutide (TRULICITY) 1.5 JI/3.52YO SOPN  Inject 1.5 mg into the skin once a week.  . gabapentin (NEURONTIN) 300 MG capsule Take 600 mg by mouth 3 (three) times daily.   . insulin aspart (NOVOLOG FLEXPEN) 100 UNIT/ML FlexPen Inject 15-21 Units into the skin 3 (three) times daily with meals.  . Insulin Degludec (TRESIBA FLEXTOUCH) 200 UNIT/ML SOPN Inject 90 Units into the skin at bedtime.  . Insulin Pen Needle (B-D ULTRAFINE III SHORT PEN) 31G X 8 MM MISC 1 each by Does not apply route as directed.  . Lancets MISC 1 each by Does not apply route as directed.  Marland Kitchen lisinopril (PRINIVIL,ZESTRIL) 2.5 MG tablet Take 2.5 mg by mouth daily.  . metFORMIN (GLUCOPHAGE) 500 MG tablet Take 1,000 mg by mouth 2 (two) times daily.  Marland Kitchen oxybutynin (DITROPAN) 5 MG tablet Take 5 mg by mouth 2 (two) times daily.  . pravastatin (PRAVACHOL) 20 MG tablet Take 1 tablet (20 mg total) by mouth every evening.  . [DISCONTINUED] Dulaglutide (TRULICITY) 1.18 AQ/7.7JP SOPN Inject 0.75 mg into the skin once a week.  . [DISCONTINUED] insulin aspart (NOVOLOG FLEXPEN) 100 UNIT/ML FlexPen Inject 10-16 Units into the skin 3 (three) times daily with meals.  . [DISCONTINUED] insulin degludec (TRESIBA FLEXTOUCH) 100 UNIT/ML SOPN FlexTouch Pen Inject 0.7 mLs (70 Units total) into the skin daily at 10 pm.   No facility-administered encounter medications on file as of 08/26/2017.     ALLERGIES: Allergies  Allergen Reactions  . Peanut-Containing Drug Products Anaphylaxis and Hives    Patient states she is allergic to all nuts  . Gluten Meal   . Latex Other (See Comments)    Reaction: blistering  . Prednisone Hives and Other (See Comments)    Reaction: causes psychological issues   . Nexium [Esomeprazole Magnesium] Hives  . Tape Rash    Paper tape please    VACCINATION STATUS: Immunization History  Administered Date(s) Administered  . Influenza Split 04/17/2012  . Pneumococcal Polysaccharide-23 04/17/2012    Diabetes  She presents for her follow-up diabetic visit. She  has type 2 diabetes mellitus. Onset time: She was diagnosed at approximate age of 21 years. Her disease course has been worsening.  There are no hypoglycemic associated symptoms. Pertinent negatives for hypoglycemia include no confusion, headaches, pallor or seizures. Associated symptoms include blurred vision, fatigue, polydipsia and polyuria. Pertinent negatives for diabetes include no chest pain and no polyphagia. There are no hypoglycemic complications. Symptoms are worsening. There are no diabetic complications. Risk factors for coronary artery disease include diabetes mellitus, hypertension, obesity and sedentary lifestyle. She is compliant with treatment most of the time. Her weight is decreasing steadily. She is following a generally unhealthy (He admits to consumption of large quantities of soda.) diet. When asked about meal planning, she reported none. She has not had a previous visit with a dietitian. She never participates in exercise. Her home blood glucose trend is increasing steadily. Her breakfast blood glucose range is generally >200 mg/dl. Her lunch blood glucose range is generally >200 mg/dl. Her dinner blood glucose range is generally >200 mg/dl. Her bedtime blood glucose range is generally >200 mg/dl. Her overall blood glucose range is >200 mg/dl. (Patient failed to call clinic for severe and persistent hyperglycemia.  Her A1c is higher at 12.9%.) An ACE inhibitor/angiotensin II receptor blocker is being taken.  Hypertension  This is a chronic problem. The current episode started more than 1 year ago. The problem is controlled. Associated symptoms include blurred vision. Pertinent negatives include no chest pain, headaches, palpitations or shortness of breath. Risk factors for coronary artery disease include obesity, sedentary lifestyle, diabetes mellitus and family history. Past treatments include ACE inhibitors.  Hyperlipidemia  This is a chronic problem. The current episode started more  than 1 year ago. The problem is uncontrolled. Exacerbating diseases include diabetes and obesity. Pertinent negatives include no chest pain, myalgias or shortness of breath. Current antihyperlipidemic treatment includes statins. Risk factors for coronary artery disease include dyslipidemia, diabetes mellitus, hypertension, obesity and a sedentary lifestyle.    Review of Systems  Constitutional: Positive for fatigue. Negative for chills, fever and unexpected weight change.  HENT: Negative for trouble swallowing and voice change.   Eyes: Positive for blurred vision. Negative for visual disturbance.  Respiratory: Negative for cough, shortness of breath and wheezing.   Cardiovascular: Negative for chest pain, palpitations and leg swelling.  Gastrointestinal: Negative for diarrhea, nausea and vomiting.  Endocrine: Positive for polydipsia and polyuria. Negative for cold intolerance, heat intolerance and polyphagia.  Musculoskeletal: Negative for arthralgias and myalgias.  Skin: Negative for color change, pallor, rash and wound.  Neurological: Negative for seizures and headaches.  Psychiatric/Behavioral: Negative for confusion and suicidal ideas.    Objective:    BP 121/72   Pulse 68   Ht 5' 5" (1.651 m)   Wt 204 lb (92.5 kg)   BMI 33.95 kg/m   Wt Readings from Last 3 Encounters:  08/26/17 204 lb (92.5 kg)  05/25/17 212 lb (96.2 kg)  05/25/17 212 lb (96.2 kg)     Physical Exam  Constitutional: She is oriented to person, place, and time. She appears well-developed.  Obese.   HENT:  Head: Normocephalic and atraumatic.  Eyes: EOM are normal.  Neck: Normal range of motion. Neck supple. No tracheal deviation present. No thyromegaly present.  Cardiovascular: Normal rate and regular rhythm.  Pulmonary/Chest: No respiratory distress.  Abdominal: Soft. Bowel sounds are normal. There is no tenderness. There is no guarding.  Musculoskeletal: Normal range of motion. She exhibits no edema.   Neurological: She is alert and oriented to person, place, and time. She has normal reflexes. No cranial nerve deficit. Coordination normal.  Skin: Skin is warm  and dry. No rash noted. No erythema. No pallor.  Diffuse tattoos  Psychiatric: She has a normal mood and affect.    CMP     Component Value Date/Time   NA 133 (L) 08/19/2017 1114   K 4.2 08/19/2017 1114   CL 98 08/19/2017 1114   CO2 25 08/19/2017 1114   GLUCOSE 408 (H) 08/19/2017 1114   BUN 15 08/19/2017 1114   CREATININE 0.62 08/19/2017 1114   CALCIUM 8.9 08/19/2017 1114   PROT 7.8 08/19/2017 1114   ALBUMIN 3.6 06/26/2014 1702   AST 43 (H) 08/19/2017 1114   ALT 52 (H) 08/19/2017 1114   ALKPHOS 128 (H) 06/26/2014 1702   BILITOT 0.4 08/19/2017 1114   GFRNONAA 104 08/19/2017 1114   GFRAA 120 08/19/2017 1114     Diabetic Labs (most recent): Lab Results  Component Value Date   HGBA1C 12.9 (H) 08/19/2017   HGBA1C 12.0 (H) 04/28/2017   HGBA1C 14 01/27/2017     Lipid Panel ( most recent) Lipid Panel     Component Value Date/Time   CHOL 200 (H) 04/28/2017 1101   TRIG 137 04/28/2017 1101   HDL 59 04/28/2017 1101   CHOLHDL 3.4 04/28/2017 1101   VLDL 28 04/17/2012 0644   LDLCALC 116 (H) 04/28/2017 1101      Lab Results  Component Value Date   TSH 0.31 (L) 04/28/2017   TSH 0.667 04/16/2012   FREET4 1.1 04/28/2017       Assessment & Plan:   1. Uncontrolled type 2 diabetes mellitus with hyperglycemia (HCC)  - Mallerie C Agent has currently uncontrolled symptomatic type 2 DM since  52 years of age. - She came with higher A1c of 12.9%, and persistently above target blood glucose profile.  She was supposed to call clinic for extremes of blood glucose readings, but she failed to do so.   -her diabetes is complicated by obesity/ sedentary life and Melodee C Barrette remains at a high risk for more acute and chronic complications which include CAD, CVA, CKD, retinopathy, and neuropathy. These are all discussed in detail with  the patient.  - I have counseled her on diet management and weight loss, by adopting a carbohydrate restricted/protein rich diet.  She admits to continued consumption of large quantities of soda.  -  Suggestion is made for her to avoid simple carbohydrates  from her diet including Cakes, Sweet Desserts / Pastries, Ice Cream, Soda (diet and regular), Sweet Tea, Candies, Chips, Cookies, Store Bought Juices, Alcohol in Excess of  1-2 drinks a day, Artificial Sweeteners, and "Sugar-free" Products. This will help patient to have stable blood glucose profile and potentially avoid unintended weight gain.  - I encouraged her to switch to  unprocessed or minimally processed complex starch and increased protein intake (animal or plant source), fruits, and vegetables.  - she is advised to stick to a routine mealtimes to eat 3 meals  a day and avoid unnecessary snacks ( to snack only to correct hypoglycemia).   - I have approached her with the following individualized plan to manage diabetes and patient agrees:   - Based on her current presentation, she will continue to require  intensive treatment with higher dose of basal/bolus insulin to achieve  control of diabetes to target. -  I advised her to increase Tresiba to 90 units nightly, increase NovoLog to 15-21 units 3 times a day before meals for pre-meal blood glucose readings above 90 mg/dL, associated with strict monitoring of blood glucose 4  times a day.  -Patient is encouraged to call clinic for blood glucose levels less than 70 or above 300 mg /dl. -  I will continue metformin 1000 mg by mouth twice a day, therapeutically suitable for patient . -She has tolerated Trulicity 0.53 mg.  I discussed and increased her Trulicity to 1.5 mg subcutaneously weekly.    Side effects and precautions discussed with her. -She will benefit from continuous glucose monitoring, will be considered on her next visit. - Patient specific target  A1c;  LDL, HDL,  Triglycerides, and  Waist Circumference were discussed in detail.  2) BP/HTN: Her blood pressure is controlled to target.  She is advised to continue her current blood pressure medications including lisinopril 2.5 mg p.o. Daily.   3) Lipids/HPL: Her recent lipid panel showed improving LDL at 116 from 141.  She is responding to her statin therapy.  She is advised to continue pravastatin 20 mg p.o. nightly.   Side effects and precautions discussed with her.   4)  Weight/Diet: CDE Consult is pending  , exercise, and detailed carbohydrates information provided.  5) Chronic Care/Health Maintenance:  -she  is on ACEI/ARB and is encouraged to continue to follow up with Ophthalmology, Dentist,  Podiatrist at least yearly or according to recommendations, and advised to   stay away from smoking. I have recommended yearly flu vaccine and pneumonia vaccination at least every 5 years; moderate intensity exercise for up to 150 minutes weekly; and  sleep for at least 7 hours a day.  - I advised patient to maintain close follow up with Redmond School, MD for primary care needs.  - Time spent with the patient: 25 min, of which >50% was spent in reviewing her blood glucose logs , discussing her hypo- and hyper-glycemic episodes, reviewing her current and  previous labs and insulin doses and developing a plan to avoid hypo- and hyper-glycemia. Please refer to Patient Instructions for Blood Glucose Monitoring and Insulin/Medications Dosing Guide"  in media tab for additional information. Cathalina Kirt Boys participated in the discussions, expressed understanding, and voiced agreement with the above plans.  All questions were answered to her satisfaction. she is encouraged to contact clinic should she have any questions or concerns prior to her return visit.   Follow up plan: - Return in about 2 weeks (around 09/09/2017) for follow up with meter and logs- no labs.  Glade Lloyd, MD [facility] [address] [place], [place] [address] Phone: [phone]  Fax: [phone]    08/26/2017, 10:54 AM  This note was partially dictated with voice recognition software. Similar sounding words can be transcribed inadequately or may not  be corrected upon review.

## 2017-08-30 ENCOUNTER — Encounter: Payer: BLUE CROSS/BLUE SHIELD | Attending: "Endocrinology | Admitting: Nutrition

## 2017-08-30 NOTE — Progress Notes (Signed)
Opened in error

## 2017-09-09 ENCOUNTER — Encounter: Payer: Self-pay | Admitting: "Endocrinology

## 2017-09-09 ENCOUNTER — Ambulatory Visit (INDEPENDENT_AMBULATORY_CARE_PROVIDER_SITE_OTHER): Payer: BLUE CROSS/BLUE SHIELD | Admitting: "Endocrinology

## 2017-09-09 VITALS — BP 132/85 | HR 92 | Ht 65.0 in | Wt 209.0 lb

## 2017-09-09 DIAGNOSIS — E782 Mixed hyperlipidemia: Secondary | ICD-10-CM | POA: Diagnosis not present

## 2017-09-09 DIAGNOSIS — E1165 Type 2 diabetes mellitus with hyperglycemia: Secondary | ICD-10-CM | POA: Diagnosis not present

## 2017-09-09 DIAGNOSIS — Z6835 Body mass index (BMI) 35.0-35.9, adult: Secondary | ICD-10-CM

## 2017-09-09 DIAGNOSIS — I10 Essential (primary) hypertension: Secondary | ICD-10-CM

## 2017-09-09 MED ORDER — INSULIN DEGLUDEC 200 UNIT/ML ~~LOC~~ SOPN: 100.0000 [IU] | PEN_INJECTOR | Freq: Every day | SUBCUTANEOUS | 2 refills | Status: DC

## 2017-09-09 MED ORDER — FREESTYLE LIBRE 14 DAY SENSOR MISC: 1.0000 | 2 refills | Status: DC

## 2017-09-09 MED ORDER — FREESTYLE LIBRE 14 DAY READER DEVI: 1.0000 | Freq: Once | 0 refills | Status: AC

## 2017-09-09 MED ORDER — INSULIN ASPART 100 UNIT/ML FLEXPEN: 20.0000 [IU] | PEN_INJECTOR | Freq: Three times a day (TID) | SUBCUTANEOUS | 2 refills | Status: DC

## 2017-09-09 NOTE — Progress Notes (Signed)
Endocrinology Follow Up Note                                                                    09/09/2017, 5:00 PM   Subjective:    Patient ID: [name], female    DOB: 04-25-1965.  [name] is being seen in  follow up for management of currently uncontrolled symptomatic diabetes requested by  Redmond School, MD.   Past Medical History:  Diagnosis Date  . Asthma   . Chronic abdominal pain   . Chronic neck pain    C4-6 fusion  . CONSTIPATION 05/16/2009   Qualifier: Diagnosis of  By: Ronnald Ramp FNP-BC, Kandice L   . Diabetes mellitus without complication ([place])   . Dysphagia 02/23/2012  . Fatty liver   . GERD (gastroesophageal reflux disease)   . Helicobacter pylori gastritis 2011  . Hyperlipidemia   . Hypertension   . LIVER FUNCTION TESTS, ABNORMAL, HX OF 05/14/2009   Qualifier: Diagnosis of  By: Stephan Minister    . Migraine headache   . MIGRAINE, COMMON 05/14/2009   Qualifier: Diagnosis of  By: Stephan Minister    . MS (multiple sclerosis) ([place])   . Sleep apnea    Past Surgical History:  Procedure Laterality Date  . ABDOMINAL HYSTERECTOMY    . APPENDECTOMY    . BACK SURGERY  03/07/2012   Nerve Stimulator  . CHOLECYSTECTOMY    . COLONOSCOPY  07/09/2009   RMR; normal rectum aside from anal canal hemorrhoids/scattered left-sided diverticula  . ESOPHAGOGASTRODUODENOSCOPY  05/22/2009   RMR; normal /small HH  . ESOPHAGOGASTRODUODENOSCOPY  2007   RMR: non-critical Schatzki's rin, non-maniuplated  . ESOPHAGOGASTRODUODENOSCOPY N/A 10/24/2013   Procedure: ESOPHAGOGASTRODUODENOSCOPY (EGD);  Surgeon: Daneil Dolin, MD;  Location: [facility];  Service: Endoscopy;  Laterality: N/A;  9:45  . ESOPHAGOGASTRODUODENOSCOPY (EGD) WITH ESOPHAGEAL DILATION  03/17/2012   ZLD:[id number] appearing esophageal mucosa of uncertain significance. Status post Venia Minks dilation followed by esophageal bx  . MALONEY DILATION N/A 10/24/2013    Procedure: Venia Minks DILATION;  Surgeon: Daneil Dolin, MD;  Location: [facility];  Service: Endoscopy;  Laterality: N/A;  . MANDIBLE SURGERY    . NECK SURGERY     X2, last time 2013   Social History   Socioeconomic History  . Marital status: Married    Spouse name: Not on file  . Number of children: Not on file  . Years of education: Not on file  . Highest education level: Not on file  Occupational History  . Not on file  Social Needs  . Financial resource strain: Not on file  . Food insecurity:    Worry: Not on file    Inability: Not on file  . Transportation needs:    Medical: Not on file    Non-medical: Not on file  Tobacco Use  . Smoking status: Never Smoker  . Smokeless tobacco: Never Used  Substance and Sexual Activity  . Alcohol use: No  . Drug use: No  . Sexual  activity: Yes    Birth control/protection: Surgical  Lifestyle  . Physical activity:    Days per week: Not on file    Minutes per session: Not on file  . Stress: Not on file  Relationships  . Social connections:    Talks on phone: Not on file    Gets together: Not on file    Attends religious service: Not on file    Active member of club or organization: Not on file    Attends meetings of clubs or organizations: Not on file    Relationship status: Not on file  Other Topics Concern  . Not on file  Social History Narrative   5 kids, raising grandchild   Outpatient Encounter Medications as of 09/09/2017  Medication Sig  . aspirin EC 81 MG tablet Take 81 mg by mouth daily.  . baclofen (LIORESAL) 20 MG tablet Take 20 mg by mouth 3 (three) times daily.   . Blood Glucose Monitoring Suppl (ACCU-CHEK GUIDE) w/Device KIT 1 Piece by Does not apply route as directed.  . citalopram (CELEXA) 40 MG tablet Take 40 mg by mouth every evening.   . Continuous Blood Gluc Receiver (FREESTYLE LIBRE 14 DAY READER) DEVI 1 each by Does not apply route once for 1 dose.  . Continuous Blood Gluc Sensor (FREESTYLE LIBRE 14  DAY SENSOR) MISC Inject 1 each into the skin every 14 (fourteen) days. Use as directed.  Marland Kitchen COPAXONE 40 MG/ML SOSY Inject into the muscle 3 (three) times a week.  . Dulaglutide (TRULICITY) 1.5 TM/2.2QJ SOPN Inject 1.5 mg into the skin once a week.  . gabapentin (NEURONTIN) 300 MG capsule Take 600 mg by mouth 3 (three) times daily.   Marland Kitchen glucose blood (ACCU-CHEK GUIDE) test strip 1 each by Other route 4 (four) times daily. Use as instructed 4 x daily. E11.65  . insulin aspart (NOVOLOG FLEXPEN) 100 UNIT/ML FlexPen Inject 20-26 Units into the skin 3 (three) times daily with meals.  . Insulin Degludec (TRESIBA FLEXTOUCH) 200 UNIT/ML SOPN Inject 100 Units into the skin at bedtime.  . Insulin Pen Needle (B-D ULTRAFINE III SHORT PEN) 31G X 8 MM MISC 1 each by Does not apply route as directed.  . Lancets MISC 1 each by Does not apply route as directed.  Marland Kitchen lisinopril (PRINIVIL,ZESTRIL) 2.5 MG tablet Take 2.5 mg by mouth daily.  . metFORMIN (GLUCOPHAGE) 500 MG tablet Take 1,000 mg by mouth 2 (two) times daily.  Marland Kitchen oxybutynin (DITROPAN) 5 MG tablet Take 5 mg by mouth 2 (two) times daily.  . pravastatin (PRAVACHOL) 20 MG tablet Take 1 tablet (20 mg total) by mouth every evening.  . [DISCONTINUED] insulin aspart (NOVOLOG FLEXPEN) 100 UNIT/ML FlexPen Inject 15-21 Units into the skin 3 (three) times daily with meals.  . [DISCONTINUED] Insulin Degludec (TRESIBA FLEXTOUCH) 200 UNIT/ML SOPN Inject 90 Units into the skin at bedtime.   No facility-administered encounter medications on file as of 09/09/2017.     ALLERGIES: Allergies  Allergen Reactions  . Peanut-Containing Drug Products Anaphylaxis and Hives    Patient states she is allergic to all nuts  . Gluten Meal   . Latex Other (See Comments)    Reaction: blistering  . Prednisone Hives and Other (See Comments)    Reaction: causes psychological issues   . Nexium [Esomeprazole Magnesium] Hives  . Tape Rash    Paper tape please    VACCINATION  STATUS: Immunization History  Administered Date(s) Administered  . Influenza Split 04/17/2012  .  Pneumococcal Polysaccharide-23 04/17/2012    Diabetes  She presents for her follow-up diabetic visit. She has type 2 diabetes mellitus. Onset time: She was diagnosed at approximate age of 24 years. Her disease course has been worsening. There are no hypoglycemic associated symptoms. Pertinent negatives for hypoglycemia include no confusion, headaches, pallor or seizures. Associated symptoms include blurred vision, fatigue, polydipsia and polyuria. Pertinent negatives for diabetes include no chest pain and no polyphagia. There are no hypoglycemic complications. Symptoms are worsening. There are no diabetic complications. Risk factors for coronary artery disease include diabetes mellitus, hypertension, obesity and sedentary lifestyle. She is compliant with treatment most of the time. Her weight is decreasing steadily. She is following a generally unhealthy (He admits to consumption of large quantities of soda.) diet. When asked about meal planning, she reported none. She has not had a previous visit with a dietitian. She never participates in exercise. Her home blood glucose trend is increasing steadily. Her breakfast blood glucose range is generally >200 mg/dl. Her lunch blood glucose range is generally >200 mg/dl. Her dinner blood glucose range is generally >200 mg/dl. Her bedtime blood glucose range is generally >200 mg/dl. Her overall blood glucose range is >200 mg/dl. (Patient failed to call clinic for severe and persistent hyperglycemia.  Her A1c is higher at 12.9%.) An ACE inhibitor/angiotensin II receptor blocker is being taken.  Hypertension  This is a chronic problem. The current episode started more than 1 year ago. The problem is controlled. Associated symptoms include blurred vision. Pertinent negatives include no chest pain, headaches, palpitations or shortness of breath. Risk factors for coronary  artery disease include obesity, sedentary lifestyle, diabetes mellitus and family history. Past treatments include ACE inhibitors.  Hyperlipidemia  This is a chronic problem. The current episode started more than 1 year ago. The problem is uncontrolled. Exacerbating diseases include diabetes and obesity. Pertinent negatives include no chest pain, myalgias or shortness of breath. Current antihyperlipidemic treatment includes statins. Risk factors for coronary artery disease include dyslipidemia, diabetes mellitus, hypertension, obesity and a sedentary lifestyle.    Review of Systems  Constitutional: Positive for fatigue. Negative for chills, fever and unexpected weight change.  HENT: Negative for trouble swallowing and voice change.   Eyes: Positive for blurred vision. Negative for visual disturbance.  Respiratory: Negative for cough, shortness of breath and wheezing.   Cardiovascular: Negative for chest pain, palpitations and leg swelling.  Gastrointestinal: Negative for diarrhea, nausea and vomiting.  Endocrine: Positive for polydipsia and polyuria. Negative for cold intolerance, heat intolerance and polyphagia.  Musculoskeletal: Negative for arthralgias and myalgias.  Skin: Negative for color change, pallor, rash and wound.  Neurological: Negative for seizures and headaches.  Psychiatric/Behavioral: Negative for confusion and suicidal ideas.    Objective:    BP 132/85   Pulse 92   Ht '5\' 5"'  (1.651 m)   Wt 209 lb (94.8 kg)   BMI 34.78 kg/m   Wt Readings from Last 3 Encounters:  09/09/17 209 lb (94.8 kg)  08/26/17 204 lb (92.5 kg)  05/25/17 212 lb (96.2 kg)     Physical Exam  Constitutional: She is oriented to person, place, and time. She appears well-developed.  Obese.   HENT:  Head: Normocephalic and atraumatic.  Eyes: EOM are normal.  Neck: Normal range of motion. Neck supple. No tracheal deviation present. No thyromegaly present.  Cardiovascular: Normal rate and regular  rhythm.  Pulmonary/Chest: No respiratory distress.  Abdominal: Soft. Bowel sounds are normal. There is no tenderness. There is no  guarding.  Musculoskeletal: Normal range of motion. She exhibits no edema.  Neurological: She is alert and oriented to person, place, and time. She has normal reflexes. No cranial nerve deficit. Coordination normal.  Skin: Skin is warm and dry. No rash noted. No erythema. No pallor.  Diffuse tattoos  Psychiatric: She has a normal mood and affect.    CMP     Component Value Date/Time   NA 133 (L) 08/19/2017 1114   K 4.2 08/19/2017 1114   CL 98 08/19/2017 1114   CO2 25 08/19/2017 1114   GLUCOSE 408 (H) 08/19/2017 1114   BUN 15 08/19/2017 1114   CREATININE 0.62 08/19/2017 1114   CALCIUM 8.9 08/19/2017 1114   PROT 7.8 08/19/2017 1114   ALBUMIN 3.6 06/26/2014 1702   AST 43 (H) 08/19/2017 1114   ALT 52 (H) 08/19/2017 1114   ALKPHOS 128 (H) 06/26/2014 1702   BILITOT 0.4 08/19/2017 1114   GFRNONAA 104 08/19/2017 1114   GFRAA 120 08/19/2017 1114     Diabetic Labs (most recent): Lab Results  Component Value Date   HGBA1C 12.9 (H) 08/19/2017   HGBA1C 12.0 (H) 04/28/2017   HGBA1C 14 01/27/2017     Lipid Panel ( most recent) Lipid Panel     Component Value Date/Time   CHOL 200 (H) 04/28/2017 1101   TRIG 137 04/28/2017 1101   HDL 59 04/28/2017 1101   CHOLHDL 3.4 04/28/2017 1101   VLDL 28 04/17/2012 0644   LDLCALC 116 (H) 04/28/2017 1101      Lab Results  Component Value Date   TSH 0.31 (L) 04/28/2017   TSH 0.667 04/16/2012   FREET4 1.1 04/28/2017       Assessment & Plan:   1. Uncontrolled type 2 diabetes mellitus with hyperglycemia (HCC)  - Niccole C Fritzsche has currently uncontrolled symptomatic type 2 DM since  52 years of age. - She recently came with  higher A1c of 12.9%. -She responded to intensified treatment with basal/bolus insulin with better blood glucose readings still above target.  -her diabetes is complicated by obesity/  sedentary life and Israella C Reyez remains at a high risk for more acute and chronic complications which include CAD, CVA, CKD, retinopathy, and neuropathy. These are all discussed in detail with the patient.  - I have counseled her on diet management and weight loss, by adopting a carbohydrate restricted/protein rich diet.  She admits to continued consumption of large quantities of soda.  -  Suggestion is made for her to avoid simple carbohydrates  from her diet including Cakes, Sweet Desserts / Pastries, Ice Cream, Soda (diet and regular), Sweet Tea, Candies, Chips, Cookies, Store Bought Juices, Alcohol in Excess of  1-2 drinks a day, Artificial Sweeteners, and "Sugar-free" Products. This will help patient to have stable blood glucose profile and potentially avoid unintended weight gain.   - I encouraged her to switch to  unprocessed or minimally processed complex starch and increased protein intake (animal or plant source), fruits, and vegetables.  - she is advised to stick to a routine mealtimes to eat 3 meals  a day and avoid unnecessary snacks ( to snack only to correct hypoglycemia).   - I have approached her with the following individualized plan to manage diabetes and patient agrees:   - Based on her current presentation, she will continue to require higher dose of basal/bolus insulin to achieve control of diabetes to target.   -  I advised her to increase Tresiba to 100 units nightly,  increase NovoLog to 20-26 units 3 times a day before meals for pre-meal blood glucose readings above 90 mg/dL, associated with strict monitoring of blood glucose 4 times a day.  -Patient is encouraged to call clinic for blood glucose levels less than 70 or above 300 mg /dl. -  I will continue metformin 1000 mg by mouth twice a day, therapeutically suitable for patient . -She has tolerated Trulicity , advised to continue Trulicity 1.5 mg subcutaneously weekly.    Side effects and precautions discussed with  her. -She will benefit from continuous glucose monitoring.  I discussed and prescribed the freestyle libre device for her.  - Patient specific target  A1c;  LDL, HDL, Triglycerides, and  Waist Circumference were discussed in detail.  2) BP/HTN: Her blood pressure is controlled to target.  She is advised to continue her current blood pressure medications including lisinopril 2.5 mg p.o. Daily.   3) Lipids/HPL: Her recent lipid panel showed improving LDL at 116 from 141.  She is responding to her statin therapy.  She is advised to continue pravastatin 20 mg p.o. nightly.   Side effects and precautions discussed with her.   4)  Weight/Diet: CDE Consult is pending  , exercise, and detailed carbohydrates information provided.  5) Chronic Care/Health Maintenance:  -she  is on ACEI/ARB and is encouraged to continue to follow up with Ophthalmology, Dentist,  Podiatrist at least yearly or according to recommendations, and advised to   stay away from smoking. I have recommended yearly flu vaccine and pneumonia vaccination at least every 5 years; moderate intensity exercise for up to 150 minutes weekly; and  sleep for at least 7 hours a day.  - I advised patient to maintain close follow up with Redmond School, MD for primary care needs.  - Time spent with the patient: 25 min, of which >50% was spent in reviewing her blood glucose logs , discussing her hypo- and hyper-glycemic episodes, reviewing her current and  previous labs and insulin doses and developing a plan to avoid hypo- and hyper-glycemia. Please refer to Patient Instructions for Blood Glucose Monitoring and Insulin/Medications Dosing Guide"  in media tab for additional information. Catilyn Kirt Boys participated in the discussions, expressed understanding, and voiced agreement with the above plans.  All questions were answered to her satisfaction. she is encouraged to contact clinic should she have any questions or concerns prior to her return  visit.  Follow up plan: - Return in about 3 months (around 12/10/2017) for follow up with pre-visit labs, meter, and logs.  Glade Lloyd, MD [facility] [address] [place], [place] [address] Phone: [phone]  Fax: [phone]    09/09/2017, 5:00 PM  This note was partially dictated with voice recognition software. Similar sounding words can be transcribed inadequately or may not  be corrected upon review.

## 2017-09-09 NOTE — Patient Instructions (Signed)

## 2017-09-14 ENCOUNTER — Ambulatory Visit (INDEPENDENT_AMBULATORY_CARE_PROVIDER_SITE_OTHER): Payer: BLUE CROSS/BLUE SHIELD

## 2017-09-14 DIAGNOSIS — E1165 Type 2 diabetes mellitus with hyperglycemia: Secondary | ICD-10-CM

## 2017-09-14 NOTE — Progress Notes (Signed)
Pt brings in hernewFreestyle Libretoday.Shehas the 14day reader and sensors. Went over instructions on how to use the Libre system. Also went over instruction on how to properly apply the sensor to herarm. Libre sensor was placed on the back of pts upper left arm. Shevoices understanding on how to use the system and how to get BG reading for insulin injections and as needed. Shewas advised to contact the office if he has any questions or concerns 

## 2017-09-14 NOTE — Patient Instructions (Signed)
Call office with any questions/concerns 

## 2017-10-04 ENCOUNTER — Ambulatory Visit: Payer: BLUE CROSS/BLUE SHIELD | Admitting: Nutrition

## 2017-10-04 ENCOUNTER — Telehealth: Payer: Self-pay | Admitting: Nutrition

## 2017-10-04 NOTE — Telephone Encounter (Signed)
VM to call and reschedule missed appt. 

## 2017-11-03 ENCOUNTER — Ambulatory Visit (INDEPENDENT_AMBULATORY_CARE_PROVIDER_SITE_OTHER): Payer: BLUE CROSS/BLUE SHIELD | Admitting: Cardiology

## 2017-11-03 ENCOUNTER — Encounter: Payer: Self-pay | Admitting: Cardiology

## 2017-11-03 VITALS — BP 114/78 | HR 94 | Ht 65.0 in | Wt 200.8 lb

## 2017-11-03 DIAGNOSIS — R0789 Other chest pain: Secondary | ICD-10-CM | POA: Diagnosis not present

## 2017-11-03 DIAGNOSIS — E782 Mixed hyperlipidemia: Secondary | ICD-10-CM | POA: Diagnosis not present

## 2017-11-03 NOTE — Patient Instructions (Addendum)

## 2017-11-03 NOTE — Progress Notes (Signed)
Clinical Summary Ms. Fairfax is a 52 y.o.female seen today for follow up of the following medical problem.s   1. Chest pain - seen in 2014 by Dr Johnsie Cancel. Around that time CT PE negative.  - reported history of esophageal spasms with prior dilatations  - 08/2016 exercise stress echo: no ischemia - 07/2016 echoLVEF 55-60%, no WMAs, grade I diastolic dysfunction  - some recent pressure midchest. Can occur at rest or with activity. No other associated symptoms. Lasts about 15 minutes. Not positional, no relation to food. Occurs about twice a month.   2. Hyperlipidemia - 06/2016 TC 232 TG 181 HDL 55 LDL 141 - has some elevation liver enzymes. Told has a fatty liver. Has not been on statin.    - in January we started pravastatin 20m daily. Mildly elevated LFTs stable from baseline.    Past Medical History:  Diagnosis Date  . Asthma   . Chronic abdominal pain   . Chronic neck pain    C4-6 fusion  . CONSTIPATION 05/16/2009   Qualifier: Diagnosis of  By: JRonnald RampFNP-BC, Kandice L   . Diabetes mellitus without complication ([facility] [place]   . Dysphagia 02/23/2012  . Fatty liver   . GERD (gastroesophageal reflux disease)   . Helicobacter pylori gastritis 2011  . Hyperlipidemia   . Hypertension   . LIVER FUNCTION TESTS, ABNORMAL, HX OF 05/14/2009   Qualifier: Diagnosis of  By: UStephan Minister   . Migraine headache   . MIGRAINE, COMMON 05/14/2009   Qualifier: Diagnosis of  By: UStephan Minister   . MS (multiple sclerosis) ([facility] [place]   . Sleep apnea      Allergies  Allergen Reactions  . Peanut-Containing Drug Products Anaphylaxis and Hives    Patient states she is allergic to all nuts  . Gluten Meal   . Latex Other (See Comments)    Reaction: blistering  . Prednisone Hives and Other (See Comments)    Reaction: causes psychological issues   . Nexium [Esomeprazole Magnesium] Hives  . Tape Rash    Paper tape please     Current Outpatient Medications  Medication Sig Dispense Refill    . aspirin EC 81 MG tablet Take 81 mg by mouth daily.    . baclofen (LIORESAL) 20 MG tablet Take 20 mg by mouth 3 (three) times daily.   3  . Blood Glucose Monitoring Suppl (ACCU-CHEK GUIDE) w/Device KIT 1 Piece by Does not apply route as directed. 1 kit 0  . citalopram (CELEXA) 40 MG tablet Take 40 mg by mouth every evening.     . Continuous Blood Gluc Sensor (FREESTYLE LIBRE 14 DAY SENSOR) MISC Inject 1 each into the skin every 14 (fourteen) days. Use as directed. 2 each 2  . COPAXONE 40 MG/ML SOSY Inject into the muscle 3 (three) times a week.    . Dulaglutide (TRULICITY) 1.5 MQA/8.3MHSOPN Inject 1.5 mg into the skin once a week. 4 pen 2  . gabapentin (NEURONTIN) 300 MG capsule Take 600 mg by mouth 3 (three) times daily.     .Marland Kitchenglucose blood (ACCU-CHEK GUIDE) test strip 1 each by Other route 4 (four) times daily. Use as instructed 4 x daily. E11.65 150 each 5  . insulin aspart (NOVOLOG FLEXPEN) 100 UNIT/ML FlexPen Inject 20-26 Units into the skin 3 (three) times daily with meals. 15 mL 2  . Insulin Degludec (TRESIBA FLEXTOUCH) 200 UNIT/ML SOPN Inject 100 Units into the skin at bedtime. 6 pen 2  .  Insulin Pen Needle (B-D ULTRAFINE III SHORT PEN) 31G X 8 MM MISC 1 each by Does not apply route as directed. 100 each 3  . Lancets MISC 1 each by Does not apply route as directed. 150 each 3  . lisinopril (PRINIVIL,ZESTRIL) 2.5 MG tablet Take 2.5 mg by mouth daily.    . metFORMIN (GLUCOPHAGE) 500 MG tablet Take 1,000 mg by mouth 2 (two) times daily.    Marland Kitchen oxybutynin (DITROPAN) 5 MG tablet Take 5 mg by mouth 2 (two) times daily.    . pravastatin (PRAVACHOL) 20 MG tablet Take 1 tablet (20 mg total) by mouth every evening. 90 tablet 3   No current facility-administered medications for this visit.      Past Surgical History:  Procedure Laterality Date  . ABDOMINAL HYSTERECTOMY    . APPENDECTOMY    . BACK SURGERY  03/07/2012   Nerve Stimulator  . CHOLECYSTECTOMY    . COLONOSCOPY  07/09/2009   RMR;  normal rectum aside from anal canal hemorrhoids/scattered left-sided diverticula  . ESOPHAGOGASTRODUODENOSCOPY  05/22/2009   RMR; normal /small HH  . ESOPHAGOGASTRODUODENOSCOPY  2007   RMR: non-critical Schatzki's rin, non-maniuplated  . ESOPHAGOGASTRODUODENOSCOPY N/A 10/24/2013   Procedure: ESOPHAGOGASTRODUODENOSCOPY (EGD);  Surgeon: Daneil Dolin, MD;  Location: [facility];  Service: Endoscopy;  Laterality: N/A;  9:45  . ESOPHAGOGASTRODUODENOSCOPY (EGD) WITH ESOPHAGEAL DILATION  03/17/2012   HKV:[id number] appearing esophageal mucosa of uncertain significance. Status post Venia Minks dilation followed by esophageal bx  . MALONEY DILATION N/A 10/24/2013   Procedure: Venia Minks DILATION;  Surgeon: Daneil Dolin, MD;  Location: [facility];  Service: Endoscopy;  Laterality: N/A;  . MANDIBLE SURGERY    . NECK SURGERY     X2, last time 2013     Allergies  Allergen Reactions  . Peanut-Containing Drug Products Anaphylaxis and Hives    Patient states she is allergic to all nuts  . Gluten Meal   . Latex Other (See Comments)    Reaction: blistering  . Prednisone Hives and Other (See Comments)    Reaction: causes psychological issues   . Nexium [Esomeprazole Magnesium] Hives  . Tape Rash    Paper tape please      Family History  Problem Relation Age of Onset  . Cancer Mother        unknown type  . Diabetes Father   . Diabetes Sister   . Colon cancer Neg Hx      Social History Ms. Tison reports that she has never smoked. She has never used smokeless tobacco. Ms. Ess reports that she does not drink alcohol.   Review of Systems CONSTITUTIONAL: No weight loss, fever, chills, weakness or fatigue.  HEENT: Eyes: No visual loss, blurred vision, double vision or yellow sclerae.No hearing loss, sneezing, congestion, runny nose or sore throat.  SKIN: No rash or itching.  CARDIOVASCULAR: per hpi RESPIRATORY: No shortness of breath, cough or sputum.  GASTROINTESTINAL: No anorexia,  nausea, vomiting or diarrhea. No abdominal pain or blood.  GENITOURINARY: No burning on urination, no polyuria NEUROLOGICAL: No headache, dizziness, syncope, paralysis, ataxia, numbness or tingling in the extremities. No change in bowel or bladder control.  MUSCULOSKELETAL: No muscle, back pain, joint pain or stiffness.  LYMPHATICS: No enlarged nodes. No history of splenectomy.  PSYCHIATRIC: No history of depression or anxiety.  ENDOCRINOLOGIC: No reports of sweating, cold or heat intolerance. No polyuria or polydipsia.  Marland Kitchen   Physical Examination Vitals:   11/03/17 0814  BP: 114/78  Pulse: 94  SpO2: 96%   Vitals:   11/03/17 0814  Weight: 200 lb 12.8 oz (91.1 kg)  Height: '5\' 5"'  (1.651 m)    Gen: resting comfortably, no acute distress HEENT: no scleral icterus, pupils equal round and reactive, no palptable cervical adenopathy,  CV: RRR, no m/r/g, no jvd Resp: Clear to auscultation bilaterally GI: abdomen is soft, non-tender, non-distended, normal bowel sounds, no hepatosplenomegaly MSK: extremities are warm, no edema.  Skin: warm, no rash Neuro:  no focal deficits Psych: appropriate affect   Diagnostic Studies  07/2016 echo Study Conclusions  - Left ventricle: The cavity size was normal. Wall thickness was increased in a pattern of mild LVH. Systolic function was normal. The estimated ejection fraction was in the range of 55% to 60%. Wall motion was normal; there were no regional wall motion abnormalities. Doppler parameters are consistent with abnormal left ventricular relaxation (grade 1 diastolic dysfunction). - Aortic valve: Valve area (VTI): 3.3 cm^2. Valve area (Vmax): 2.73 cm^2. - Technically adequate study.  08/2016 stress echo Study Conclusions  - Stress ECG conclusions: The stress ECG was normal. - Staged echo: Resting left ventricular systolic function was hyperdynamic, LVEF 65-70%. With stress, there was augmented contractility of all  wall segments, LVEF 85%. No evidence of stress induced ischemia. Normal echo stress  Impressions:  - No evidence of stress induced ischemia. Exercise tolerance was reduced. Clinical correlation indicated.      Assessment and Plan  1. Chest pain -ongoing history of chest not consistent with cardiac etiology. Negative stress test last year. Will continue to monitor at this time, if symptoms change or progress and concern increases for possible cardiac cause would consider definitive cath.  EKG today SR, no acute ischemic changes  2. Hyperlipidemia - on low dose statin. Chronic mild LFTs elevation unchanged since starting pravastatin, continue current meds        Arnoldo Lenis, M.D

## 2017-11-24 ENCOUNTER — Ambulatory Visit: Payer: BLUE CROSS/BLUE SHIELD | Admitting: Gastroenterology

## 2017-11-24 ENCOUNTER — Telehealth: Payer: Self-pay | Admitting: Gastroenterology

## 2017-11-24 ENCOUNTER — Encounter: Payer: Self-pay | Admitting: Internal Medicine

## 2017-11-24 NOTE — Telephone Encounter (Signed)
PATIENT WAS A NO SHOW AND LETTER SENT  °

## 2017-12-14 ENCOUNTER — Other Ambulatory Visit: Payer: Self-pay

## 2017-12-14 ENCOUNTER — Ambulatory Visit: Payer: BLUE CROSS/BLUE SHIELD | Admitting: "Endocrinology

## 2017-12-14 MED ORDER — FREESTYLE LIBRE 14 DAY SENSOR MISC: 1.0000 | 2 refills | Status: DC

## 2017-12-15 LAB — COMPLETE METABOLIC PANEL WITH GFR
AG Ratio: 1.1 (calc) (ref 1.0–2.5)
ALT: 46 U/L — ABNORMAL HIGH (ref 6–29)
AST: 43 U/L — ABNORMAL HIGH (ref 10–35)
Albumin: 3.8 g/dL (ref 3.6–5.1)
Alkaline phosphatase (APISO): 115 U/L (ref 33–130)
BUN: 16 mg/dL (ref 7–25)
CO2: 27 mmol/L (ref 20–32)
Calcium: 9.1 mg/dL (ref 8.6–10.4)
Chloride: 99 mmol/L (ref 98–110)
Creat: 0.66 mg/dL (ref 0.50–1.05)
GFR, Est African American: 118 mL/min/{1.73_m2} (ref >=60)
GFR, Est Non African American: 102 mL/min/{1.73_m2} (ref >=60)
Globulin: 3.4 g/dL (calc) (ref 1.9–3.7)
Glucose, Bld: 334 mg/dL — ABNORMAL HIGH (ref 65–99)
Potassium: 4.1 mmol/L (ref 3.5–5.3)
Sodium: 135 mmol/L (ref 135–146)
Total Bilirubin: 0.4 mg/dL (ref 0.2–1.2)
Total Protein: 7.2 g/dL (ref 6.1–8.1)

## 2017-12-15 LAB — HEMOGLOBIN A1C: Hgb A1c MFr Bld: >14 % of total Hgb — ABNORMAL HIGH (ref <5.7)

## 2018-01-03 ENCOUNTER — Ambulatory Visit: Payer: BLUE CROSS/BLUE SHIELD | Admitting: "Endocrinology

## 2018-01-03 ENCOUNTER — Encounter: Payer: Self-pay | Admitting: "Endocrinology

## 2018-01-03 VITALS — BP 136/74 | HR 91 | Ht 65.0 in | Wt 198.0 lb

## 2018-01-03 DIAGNOSIS — E1165 Type 2 diabetes mellitus with hyperglycemia: Secondary | ICD-10-CM

## 2018-01-03 DIAGNOSIS — I10 Essential (primary) hypertension: Secondary | ICD-10-CM

## 2018-01-03 DIAGNOSIS — E782 Mixed hyperlipidemia: Secondary | ICD-10-CM

## 2018-01-03 MED ORDER — INSULIN ASPART 100 UNIT/ML FLEXPEN: 20.0000 [IU] | PEN_INJECTOR | Freq: Three times a day (TID) | SUBCUTANEOUS | 0 refills | Status: DC

## 2018-01-03 NOTE — Patient Instructions (Signed)

## 2018-01-03 NOTE — Progress Notes (Signed)
Endocrinology Follow Up Note                                                                    01/03/2018, 2:00 PM   Subjective:    Patient ID: [name], female    DOB: [birth year].  [name] is being seen in  follow up for management of currently uncontrolled symptomatic 2 diabetes, hyperlipidemia, hypertension. PMD:   [employer], MD.   Past Medical History:  Diagnosis Date  . Asthma   . Chronic abdominal pain   . Chronic neck pain    C4-6 fusion  . CONSTIPATION 05/16/2009   Qualifier: Diagnosis of  By: Ronnald Ramp FNP-BC, Kandice L   . Diabetes mellitus without complication ([place])   . Dysphagia 02/23/2012  . Fatty liver   . GERD (gastroesophageal reflux disease)   . Helicobacter pylori gastritis 2011  . Hyperlipidemia   . Hypertension   . LIVER FUNCTION TESTS, ABNORMAL, HX OF 05/14/2009   Qualifier: Diagnosis of  By: Stephan Minister    . Migraine headache   . MIGRAINE, COMMON 05/14/2009   Qualifier: Diagnosis of  By: Stephan Minister    . MS (multiple sclerosis) ([place])   . Sleep apnea    Past Surgical History:  Procedure Laterality Date  . ABDOMINAL HYSTERECTOMY    . APPENDECTOMY    . BACK SURGERY  03/07/2012   Nerve Stimulator  . CHOLECYSTECTOMY    . COLONOSCOPY  07/09/2009   RMR; normal rectum aside from anal canal hemorrhoids/scattered left-sided diverticula  . ESOPHAGOGASTRODUODENOSCOPY  05/22/2009   RMR; normal /small HH  . ESOPHAGOGASTRODUODENOSCOPY  2007   RMR: non-critical Schatzki's rin, non-maniuplated  . ESOPHAGOGASTRODUODENOSCOPY N/A 10/24/2013   Procedure: ESOPHAGOGASTRODUODENOSCOPY (EGD);  Surgeon: Daneil Dolin, MD;  Location: [facility];  Service: Endoscopy;  Laterality: N/A;  9:45  . ESOPHAGOGASTRODUODENOSCOPY (EGD) WITH ESOPHAGEAL DILATION  03/17/2012   [id number] appearing esophageal mucosa of uncertain significance. Status post Venia Minks dilation followed by esophageal bx  . MALONEY  DILATION N/A 10/24/2013   Procedure: Venia Minks DILATION;  Surgeon: Daneil Dolin, MD;  Location: [facility];  Service: Endoscopy;  Laterality: N/A;  . MANDIBLE SURGERY    . NECK SURGERY     X2, last time 2013   Social History   Socioeconomic History  . Marital status: Married    Spouse name: Not on file  . Number of children: Not on file  . Years of education: Not on file  . Highest education level: Not on file  Occupational History  . Not on file  Social Needs  . Financial resource strain: Not on file  . Food insecurity:    Worry: Not on file    Inability: Not on file  . Transportation needs:    Medical: Not on file    Non-medical: Not on file  Tobacco Use  . Smoking status: Never Smoker  . Smokeless tobacco: Never Used  Substance and Sexual Activity  . Alcohol use: No  . Drug use: No  .  Sexual activity: Yes    Birth control/protection: Surgical  Lifestyle  . Physical activity:    Days per week: Not on file    Minutes per session: Not on file  . Stress: Not on file  Relationships  . Social connections:    Talks on phone: Not on file    Gets together: Not on file    Attends religious service: Not on file    Active member of club or organization: Not on file    Attends meetings of clubs or organizations: Not on file    Relationship status: Not on file  Other Topics Concern  . Not on file  Social History Narrative   5 kids, raising grandchild   Outpatient Encounter Medications as of 01/03/2018  Medication Sig  . aspirin EC 81 MG tablet Take 81 mg by mouth daily.  . baclofen (LIORESAL) 20 MG tablet Take 20 mg by mouth 3 (three) times daily.   . Blood Glucose Monitoring Suppl (ACCU-CHEK GUIDE) w/Device KIT 1 Piece by Does not apply route as directed.  . citalopram (CELEXA) 40 MG tablet Take 40 mg by mouth every evening.   . Continuous Blood Gluc Sensor (FREESTYLE LIBRE 14 DAY SENSOR) MISC Inject 1 each into the skin every 14 (fourteen) days. Use as directed.  Marland Kitchen  COPAXONE 40 MG/ML SOSY Inject into the muscle 3 (three) times a week.  . Dulaglutide (TRULICITY) 1.5 XF/8.1WE SOPN Inject 1.5 mg into the skin once a week.  . gabapentin (NEURONTIN) 300 MG capsule Take 600 mg by mouth 3 (three) times daily.   Marland Kitchen glucose blood (ACCU-CHEK GUIDE) test strip 1 each by Other route 4 (four) times daily. Use as instructed 4 x daily. E11.65  . insulin aspart (NOVOLOG FLEXPEN) 100 UNIT/ML FlexPen Inject 20-26 Units into the skin 3 (three) times daily with meals.  . Insulin Degludec (TRESIBA FLEXTOUCH) 200 UNIT/ML SOPN Inject 100 Units into the skin at bedtime.  . Insulin Pen Needle (B-D ULTRAFINE III SHORT PEN) 31G X 8 MM MISC 1 each by Does not apply route as directed.  . Lancets MISC 1 each by Does not apply route as directed.  Marland Kitchen lisinopril (PRINIVIL,ZESTRIL) 2.5 MG tablet Take 2.5 mg by mouth daily.  . metFORMIN (GLUCOPHAGE) 500 MG tablet Take 1,000 mg by mouth 2 (two) times daily.  Marland Kitchen oxybutynin (DITROPAN) 5 MG tablet Take 5 mg by mouth 2 (two) times daily.  . pravastatin (PRAVACHOL) 20 MG tablet Take 1 tablet (20 mg total) by mouth every evening.  . [DISCONTINUED] insulin aspart (NOVOLOG FLEXPEN) 100 UNIT/ML FlexPen Inject 20-26 Units into the skin 3 (three) times daily with meals.   No facility-administered encounter medications on file as of 01/03/2018.     ALLERGIES: Allergies  Allergen Reactions  . Peanut-Containing Drug Products Anaphylaxis and Hives    Patient states she is allergic to all nuts  . Gluten Meal   . Latex Other (See Comments)    Reaction: blistering  . Prednisone Hives and Other (See Comments)    Reaction: causes psychological issues   . Nexium [Esomeprazole Magnesium] Hives  . Tape Rash    Paper tape please    VACCINATION STATUS: Immunization History  Administered Date(s) Administered  . Influenza Split 04/17/2012  . Pneumococcal Polysaccharide-23 04/17/2012    Diabetes  She presents for her follow-up diabetic visit. She has type  2 diabetes mellitus. Onset time: She was diagnosed at approximate age of 52 years. Her disease course has been worsening. There are  no hypoglycemic associated symptoms. Pertinent negatives for hypoglycemia include no confusion, headaches, pallor or seizures. Associated symptoms include blurred vision, fatigue, polydipsia and polyuria. Pertinent negatives for diabetes include no chest pain and no polyphagia. There are no hypoglycemic complications. Symptoms are worsening. There are no diabetic complications. Risk factors for coronary artery disease include diabetes mellitus, hypertension, obesity, sedentary lifestyle and dyslipidemia. Current diabetic treatment includes insulin injections. She is compliant with treatment most of the time. Her weight is fluctuating minimally. She is following a generally healthy (He admits to consumption of large quantities of soda.) diet. When asked about meal planning, she reported none. She has not had a previous visit with a dietitian. She never participates in exercise. Her home blood glucose trend is increasing steadily. Her breakfast blood glucose range is generally >200 mg/dl. Her lunch blood glucose range is generally >200 mg/dl. Her dinner blood glucose range is generally >200 mg/dl. Her bedtime blood glucose range is generally >200 mg/dl. Her overall blood glucose range is >200 mg/dl. (She did not present her recent weeks of insulin administration logs, A1c is >14% increasing from 12.9%.  ) An ACE inhibitor/angiotensin II receptor blocker is being taken.  Hypertension  This is a chronic problem. The current episode started more than 1 year ago. The problem is controlled. Associated symptoms include blurred vision. Pertinent negatives include no chest pain, headaches, palpitations or shortness of breath. Risk factors for coronary artery disease include obesity, sedentary lifestyle, diabetes mellitus and family history. Past treatments include ACE inhibitors.   Hyperlipidemia  This is a chronic problem. The current episode started more than 1 year ago. The problem is uncontrolled. Exacerbating diseases include diabetes and obesity. Pertinent negatives include no chest pain, myalgias or shortness of breath. Current antihyperlipidemic treatment includes statins. Risk factors for coronary artery disease include dyslipidemia, diabetes mellitus, hypertension, obesity and a sedentary lifestyle.    Review of Systems  Constitutional: Positive for fatigue. Negative for chills, fever and unexpected weight change.  HENT: Negative for trouble swallowing and voice change.   Eyes: Positive for blurred vision. Negative for visual disturbance.  Respiratory: Negative for cough, shortness of breath and wheezing.   Cardiovascular: Negative for chest pain, palpitations and leg swelling.  Gastrointestinal: Negative for diarrhea, nausea and vomiting.  Endocrine: Positive for polydipsia and polyuria. Negative for cold intolerance, heat intolerance and polyphagia.  Musculoskeletal: Negative for arthralgias and myalgias.  Skin: Negative for color change, pallor, rash and wound.  Neurological: Negative for seizures and headaches.  Psychiatric/Behavioral: Negative for confusion and suicidal ideas.    Objective:    BP 136/74   Pulse 91   Ht 5' 5" (1.651 m)   Wt 198 lb (89.8 kg)   BMI 32.95 kg/m   Wt Readings from Last 3 Encounters:  01/03/18 198 lb (89.8 kg)  11/03/17 200 lb 12.8 oz (91.1 kg)  09/14/17 209 lb (94.8 kg)     Physical Exam  Constitutional: She is oriented to person, place, and time. She appears well-developed.  Obese.   HENT:  Head: Normocephalic and atraumatic.  Eyes: EOM are normal.  Neck: Normal range of motion. Neck supple. No tracheal deviation present. No thyromegaly present.  Cardiovascular: Normal rate and regular rhythm.  Pulmonary/Chest: Effort normal. No respiratory distress.  Abdominal: Soft. There is no tenderness. There is no  guarding.  Not enough convincing evidence of insulin injections on skin of her abdomen.  Musculoskeletal: Normal range of motion. She exhibits no edema.  Neurological: She is alert and oriented  to person, place, and time. No cranial nerve deficit. Coordination normal.  Skin: Skin is warm and dry. No rash noted. No erythema. No pallor.  Diffuse tattoos  Psychiatric: She has a normal mood and affect.    CMP     Component Value Date/Time   NA 135 12/14/2017 0922   K 4.1 12/14/2017 0922   CL 99 12/14/2017 0922   CO2 27 12/14/2017 0922   GLUCOSE 334 (H) 12/14/2017 0922   BUN 16 12/14/2017 0922   CREATININE 0.66 12/14/2017 0922   CALCIUM 9.1 12/14/2017 0922   PROT 7.2 12/14/2017 0922   ALBUMIN 3.6 06/26/2014 1702   AST 43 (H) 12/14/2017 0922   ALT 46 (H) 12/14/2017 0922   ALKPHOS 128 (H) 06/26/2014 1702   BILITOT 0.4 12/14/2017 0922   GFRNONAA 102 12/14/2017 0922   GFRAA 118 12/14/2017 0922     Diabetic Labs (most recent): Lab Results  Component Value Date   HGBA1C >14.0 (H) 12/14/2017   HGBA1C 12.9 (H) 08/19/2017   HGBA1C 12.0 (H) 04/28/2017     Lipid Panel ( most recent) Lipid Panel     Component Value Date/Time   CHOL 200 (H) 04/28/2017 1101   TRIG 137 04/28/2017 1101   HDL 59 04/28/2017 1101   CHOLHDL 3.4 04/28/2017 1101   VLDL 28 04/17/2012 0644   LDLCALC 116 (H) 04/28/2017 1101      Lab Results  Component Value Date   TSH 0.31 (L) 04/28/2017   TSH 0.667 04/16/2012   FREET4 1.1 04/28/2017       Assessment & Plan:   1. Uncontrolled type 2 diabetes mellitus with hyperglycemia (HCC)  - [name] has currently uncontrolled symptomatic type 2 DM since  52 years of age. -This patient continues to struggle to control his diabetes to target.  After missing her appointment she presents with A1c of > 14%, increasing from 12.9% during her last visit.     -her diabetes is complicated by obesity/ sedentary life and [name] remains at extremely high risk for  more acute and chronic complications which include CAD, CVA, CKD, retinopathy, and neuropathy. These are all discussed in detail with the patient.  - I have counseled her on diet management and weight loss, by adopting a carbohydrate restricted/protein rich diet.  She admits to continued consumption of large quantities of soda.  -  Suggestion is made for her to avoid simple carbohydrates  from her diet including Cakes, Sweet Desserts / Pastries, Ice Cream, Soda (diet and regular), Sweet Tea, Candies, Chips, Cookies, Store Bought Juices, Alcohol in Excess of  1-2 drinks a day, Artificial Sweeteners, and "Sugar-free" Products. This will help patient to have stable blood glucose profile and potentially avoid unintended weight gain.   - I encouraged her to switch to  unprocessed or minimally processed complex starch and increased protein intake (animal or plant source), fruits, and vegetables.  - she is advised to stick to a routine mealtimes to eat 3 meals  a day and avoid unnecessary snacks ( to snack only to correct hypoglycemia).   - I have approached her with the following individualized plan to manage diabetes and patient agrees:   -She is states that she is injecting insulin prescribed and ordered.  Unfortunately, examination of her abdominal skin did not display enough evidence of insulin administration.  - Based on her current presentation, she will continue to require higher dose of basal/bolus insulin to achieve control of diabetes to target, or even  switch to insulin U500. -Since she has them supplies of [place] and NovoLog, she will be allowed to use it for 2 weeks before the switch.  -  I advised her to to new Tresiba 100 units nightly, continue NovoLog 20-26 units 3 times a day before meals for pre-meal blood glucose readings above 90 mg/dL, associated with strict monitoring of blood glucose 4 times a day.  -She is encouraged to call clinic for blood glucose levels less than 70 or  above 300 mg /dl. -She is advised to continue metformin 1000 mg p.o. twice daily, therapeutically suitable for patient . -She has tolerated Trulicity , advised to continue Trulicity 1.5 mg subcutaneously weekly.    Side effects and precautions discussed with her. -She is advised to stick to the continuous glucose monitoring device. -She is advised to bring all leftover insulin during her next visit in 2 weeks.   - Patient specific target  A1c;  LDL, HDL, Triglycerides, and  Waist Circumference were discussed in detail.  2) BP/HTN: Her blood pressure is controlled to target.    She is advised to continue her current blood pressure medications including lisinopril 2.5 mg p.o. Daily.   3) Lipids/HPL: Her last lipid panel showed improving LDL of 116, improving from 141.  She is advised to continue pravastatin 20 mill grams p.o. nightly.      Side effects and precautions discussed with her.   4)  Weight/Diet: CDE Consult is pending  , exercise, and detailed carbohydrates information provided.  5) Chronic Care/Health Maintenance:  -she  is on ACEI/ARB and is encouraged to continue to follow up with Ophthalmology, Dentist,  Podiatrist at least yearly or according to recommendations, and advised to   stay away from smoking. I have recommended yearly flu vaccine and pneumonia vaccination at least every 5 years; moderate intensity exercise for up to 150 minutes weekly; and  sleep for at least 7 hours a day.  - I advised patient to maintain close follow up with [employer], MD for primary care needs.  - Time spent with the patient: 25 min, of which >50% was spent in reviewing her blood glucose logs , discussing her hypo- and hyper-glycemic episodes, reviewing her current and  previous labs and insulin doses and developing a plan to avoid hypo- and hyper-glycemia. Please refer to Patient Instructions for Blood Glucose Monitoring and Insulin/Medications Dosing Guide"  in media tab for additional  information. [name] participated in the discussions, expressed understanding, and voiced agreement with the above plans.  All questions were answered to her satisfaction. she is encouraged to contact clinic should she have any questions or concerns prior to her return visit.  Follow up plan: - Return in about 2 weeks (around 01/17/2018) for Follow up with Meter and Logs Only - no Labs.  Glade Lloyd, MD [facility] [address] [place], [place] [address] Phone: [phone]  Fax: [phone]    01/03/2018, 2:00 PM  This note was partially dictated with voice recognition software. Similar sounding words can be transcribed inadequately or may not  be corrected upon review.

## 2018-01-20 ENCOUNTER — Encounter: Payer: Self-pay | Admitting: "Endocrinology

## 2018-01-20 ENCOUNTER — Ambulatory Visit (INDEPENDENT_AMBULATORY_CARE_PROVIDER_SITE_OTHER): Payer: BLUE CROSS/BLUE SHIELD | Admitting: "Endocrinology

## 2018-01-20 VITALS — BP 114/72 | HR 91 | Ht 65.0 in | Wt 198.0 lb

## 2018-01-20 DIAGNOSIS — E1165 Type 2 diabetes mellitus with hyperglycemia: Secondary | ICD-10-CM | POA: Diagnosis not present

## 2018-01-20 DIAGNOSIS — E782 Mixed hyperlipidemia: Secondary | ICD-10-CM

## 2018-01-20 DIAGNOSIS — I10 Essential (primary) hypertension: Secondary | ICD-10-CM

## 2018-01-20 DIAGNOSIS — Z6835 Body mass index (BMI) 35.0-35.9, adult: Secondary | ICD-10-CM

## 2018-01-20 MED ORDER — INSULIN REGULAR HUMAN (CONC) 500 UNIT/ML ~~LOC~~ SOPN: 60.0000 [IU] | PEN_INJECTOR | Freq: Three times a day (TID) | SUBCUTANEOUS | 2 refills | Status: DC

## 2018-01-20 NOTE — Progress Notes (Signed)
Endocrinology Follow Up Note                                                                    01/20/2018, 12:39 PM   Subjective:    Patient ID: [name], female    DOB: [birth year].  [name] is being seen in  follow up for management of currently uncontrolled symptomatic 2 diabetes, hyperlipidemia, hypertension. PMD:   [employer], MD.   Past Medical History:  Diagnosis Date  . Asthma   . Chronic abdominal pain   . Chronic neck pain    C4-6 fusion  . CONSTIPATION 05/16/2009   Qualifier: Diagnosis of  By: Ronnald Ramp FNP-BC, Kandice L   . Diabetes mellitus without complication ([place])   . Dysphagia 02/23/2012  . Fatty liver   . GERD (gastroesophageal reflux disease)   . Helicobacter pylori gastritis 2011  . Hyperlipidemia   . Hypertension   . LIVER FUNCTION TESTS, ABNORMAL, HX OF 05/14/2009   Qualifier: Diagnosis of  By: Stephan Minister    . Migraine headache   . MIGRAINE, COMMON 05/14/2009   Qualifier: Diagnosis of  By: Stephan Minister    . MS (multiple sclerosis) ([place])   . Sleep apnea    Past Surgical History:  Procedure Laterality Date  . ABDOMINAL HYSTERECTOMY    . APPENDECTOMY    . BACK SURGERY  03/07/2012   Nerve Stimulator  . CHOLECYSTECTOMY    . COLONOSCOPY  07/09/2009   RMR; normal rectum aside from anal canal hemorrhoids/scattered left-sided diverticula  . ESOPHAGOGASTRODUODENOSCOPY  05/22/2009   RMR; normal /small HH  . ESOPHAGOGASTRODUODENOSCOPY  2007   RMR: non-critical Schatzki's rin, non-maniuplated  . ESOPHAGOGASTRODUODENOSCOPY N/A 10/24/2013   Procedure: ESOPHAGOGASTRODUODENOSCOPY (EGD);  Surgeon: Daneil Dolin, MD;  Location: [facility];  Service: Endoscopy;  Laterality: N/A;  9:45  . ESOPHAGOGASTRODUODENOSCOPY (EGD) WITH ESOPHAGEAL DILATION  03/17/2012   DGL:OVFIEPPI appearing esophageal mucosa of uncertain significance. Status post Venia Minks dilation followed by esophageal bx  .  MALONEY DILATION N/A 10/24/2013   Procedure: Venia Minks DILATION;  Surgeon: Daneil Dolin, MD;  Location: [facility];  Service: Endoscopy;  Laterality: N/A;  . MANDIBLE SURGERY    . NECK SURGERY     X2, last time 2013   Social History   Socioeconomic History  . Marital status: Married    Spouse name: Not on file  . Number of children: Not on file  . Years of education: Not on file  . Highest education level: Not on file  Occupational History  . Not on file  Social Needs  . Financial resource strain: Not on file  . Food insecurity:    Worry: Not on file    Inability: Not on file  . Transportation needs:    Medical: Not on file    Non-medical: Not on file  Tobacco Use  . Smoking status: Never Smoker  . Smokeless tobacco: Never Used  Substance and Sexual Activity  . Alcohol use: No  . Drug use: No  .  Sexual activity: Yes    Birth control/protection: Surgical  Lifestyle  . Physical activity:    Days per week: Not on file    Minutes per session: Not on file  . Stress: Not on file  Relationships  . Social connections:    Talks on phone: Not on file    Gets together: Not on file    Attends religious service: Not on file    Active member of club or organization: Not on file    Attends meetings of clubs or organizations: Not on file    Relationship status: Not on file  Other Topics Concern  . Not on file  Social History Narrative   5 kids, raising grandchild   Outpatient Encounter Medications as of 01/20/2018  Medication Sig  . aspirin EC 81 MG tablet Take 81 mg by mouth daily.  . baclofen (LIORESAL) 20 MG tablet Take 20 mg by mouth 3 (three) times daily.   . Blood Glucose Monitoring Suppl (ACCU-CHEK GUIDE) w/Device KIT 1 Piece by Does not apply route as directed.  . citalopram (CELEXA) 40 MG tablet Take 40 mg by mouth every evening.   . Continuous Blood Gluc Sensor (FREESTYLE LIBRE 14 DAY SENSOR) MISC Inject 1 each into the skin every 14 (fourteen) days. Use as  directed.  Marland Kitchen COPAXONE 40 MG/ML SOSY Inject into the muscle 3 (three) times a week.  . Dulaglutide (TRULICITY) 1.5 VP/7.1GG SOPN Inject 1.5 mg into the skin once a week.  . gabapentin (NEURONTIN) 300 MG capsule Take 600 mg by mouth 3 (three) times daily.   Marland Kitchen glucose blood (ACCU-CHEK GUIDE) test strip 1 each by Other route 4 (four) times daily. Use as instructed 4 x daily. E11.65  . Insulin Pen Needle (B-D ULTRAFINE III SHORT PEN) 31G X 8 MM MISC 1 each by Does not apply route as directed.  . insulin regular human CONCENTRATED (HUMULIN R U-500 KWIKPEN) 500 UNIT/ML kwikpen Inject 60 Units into the skin 3 (three) times daily with meals.  . Lancets MISC 1 each by Does not apply route as directed.  Marland Kitchen lisinopril (PRINIVIL,ZESTRIL) 2.5 MG tablet Take 2.5 mg by mouth daily.  . metFORMIN (GLUCOPHAGE) 500 MG tablet Take 1,000 mg by mouth 2 (two) times daily.  Marland Kitchen oxybutynin (DITROPAN) 5 MG tablet Take 5 mg by mouth 2 (two) times daily.  . pravastatin (PRAVACHOL) 20 MG tablet Take 1 tablet (20 mg total) by mouth every evening.  . [DISCONTINUED] insulin aspart (NOVOLOG FLEXPEN) 100 UNIT/ML FlexPen Inject 20-26 Units into the skin 3 (three) times daily with meals.  . [DISCONTINUED] Insulin Degludec (TRESIBA FLEXTOUCH) 200 UNIT/ML SOPN Inject 100 Units into the skin at bedtime.   No facility-administered encounter medications on file as of 01/20/2018.     ALLERGIES: Allergies  Allergen Reactions  . Peanut-Containing Drug Products Anaphylaxis and Hives    Patient states she is allergic to all nuts  . Gluten Meal   . Latex Other (See Comments)    Reaction: blistering  . Prednisone Hives and Other (See Comments)    Reaction: causes psychological issues   . Nexium [Esomeprazole Magnesium] Hives  . Tape Rash    Paper tape please    VACCINATION STATUS: Immunization History  Administered Date(s) Administered  . Influenza Split 04/17/2012  . Pneumococcal Polysaccharide-23 04/17/2012    Diabetes  She  presents for her follow-up diabetic visit. She has type 2 diabetes mellitus. Onset time: She was diagnosed at approximate age of 52 years. Her disease course  has been worsening. There are no hypoglycemic associated symptoms. Pertinent negatives for hypoglycemia include no confusion, headaches, pallor or seizures. Associated symptoms include blurred vision, fatigue, polydipsia and polyuria. Pertinent negatives for diabetes include no chest pain and no polyphagia. There are no hypoglycemic complications. Symptoms are worsening. There are no diabetic complications. Risk factors for coronary artery disease include diabetes mellitus, hypertension, obesity, sedentary lifestyle and dyslipidemia. Current diabetic treatment includes insulin injections. She is compliant with treatment most of the time. Her weight is fluctuating minimally. She is following a generally healthy (she admits to consumption of large quantities of soda.) diet. When asked about meal planning, she reported none. She has not had a previous visit with a dietitian. She never participates in exercise. Her home blood glucose trend is increasing steadily. Her breakfast blood glucose range is generally >200 mg/dl. Her lunch blood glucose range is generally >200 mg/dl. Her dinner blood glucose range is generally >200 mg/dl. Her bedtime blood glucose range is generally >200 mg/dl. Her overall blood glucose range is >200 mg/dl. (She returns with persistent and severe hyperglycemia, her recent labs showed  A1c is >14% increasing from 12.9%.  ) An ACE inhibitor/angiotensin II receptor blocker is being taken.  Hypertension  This is a chronic problem. The current episode started more than 1 year ago. The problem is controlled. Associated symptoms include blurred vision. Pertinent negatives include no chest pain, headaches, palpitations or shortness of breath. Risk factors for coronary artery disease include obesity, sedentary lifestyle, diabetes mellitus and  family history. Past treatments include ACE inhibitors.  Hyperlipidemia  This is a chronic problem. The current episode started more than 1 year ago. The problem is uncontrolled. Exacerbating diseases include diabetes and obesity. Pertinent negatives include no chest pain, myalgias or shortness of breath. Current antihyperlipidemic treatment includes statins. Risk factors for coronary artery disease include dyslipidemia, diabetes mellitus, hypertension, obesity and a sedentary lifestyle.    Review of Systems  Constitutional: Positive for fatigue. Negative for chills, fever and unexpected weight change.  HENT: Negative for trouble swallowing and voice change.   Eyes: Positive for blurred vision. Negative for visual disturbance.  Respiratory: Negative for cough, shortness of breath and wheezing.   Cardiovascular: Negative for chest pain, palpitations and leg swelling.  Gastrointestinal: Negative for diarrhea, nausea and vomiting.  Endocrine: Positive for polydipsia and polyuria. Negative for cold intolerance, heat intolerance and polyphagia.  Musculoskeletal: Negative for arthralgias and myalgias.  Skin: Negative for color change, pallor, rash and wound.  Neurological: Negative for seizures and headaches.  Psychiatric/Behavioral: Negative for confusion and suicidal ideas.    Objective:    BP 114/72   Pulse 91   Ht '5\' 5"'  (1.651 m)   Wt 198 lb (89.8 kg)   BMI 32.95 kg/m   Wt Readings from Last 3 Encounters:  01/20/18 198 lb (89.8 kg)  01/03/18 198 lb (89.8 kg)  11/03/17 200 lb 12.8 oz (91.1 kg)     Physical Exam  Constitutional: She is oriented to person, place, and time. She appears well-developed.  Obese.   HENT:  Head: Normocephalic and atraumatic.  Eyes: EOM are normal.  Neck: Normal range of motion. Neck supple. No tracheal deviation present. No thyromegaly present.  Cardiovascular: Normal rate and regular rhythm.  Pulmonary/Chest: Effort normal. No respiratory distress.   Abdominal: Soft. There is no tenderness. There is no guarding.  Musculoskeletal: Normal range of motion. She exhibits no edema.  Neurological: She is alert and oriented to person, place, and time. No cranial  nerve deficit. Coordination normal.  Skin: Skin is warm and dry. No rash noted. No erythema. No pallor.  Diffuse tattoos  Psychiatric: She has a normal mood and affect.    CMP     Component Value Date/Time   NA 135 12/14/2017 0922   K 4.1 12/14/2017 0922   CL 99 12/14/2017 0922   CO2 27 12/14/2017 0922   GLUCOSE 334 (H) 12/14/2017 0922   BUN 16 12/14/2017 0922   CREATININE 0.66 12/14/2017 0922   CALCIUM 9.1 12/14/2017 0922   PROT 7.2 12/14/2017 0922   ALBUMIN 3.6 06/26/2014 1702   AST 43 (H) 12/14/2017 0922   ALT 46 (H) 12/14/2017 0922   ALKPHOS 128 (H) 06/26/2014 1702   BILITOT 0.4 12/14/2017 0922   GFRNONAA 102 12/14/2017 0922   GFRAA 118 12/14/2017 0922     Diabetic Labs (most recent): Lab Results  Component Value Date   HGBA1C >14.0 (H) 12/14/2017   HGBA1C 12.9 (H) 08/19/2017   HGBA1C 12.0 (H) 04/28/2017     Lipid Panel ( most recent) Lipid Panel     Component Value Date/Time   CHOL 200 (H) 04/28/2017 1101   TRIG 137 04/28/2017 1101   HDL 59 04/28/2017 1101   CHOLHDL 3.4 04/28/2017 1101   VLDL 28 04/17/2012 0644   LDLCALC 116 (H) 04/28/2017 1101      Lab Results  Component Value Date   TSH 0.31 (L) 04/28/2017   TSH 0.667 04/16/2012   FREET4 1.1 04/28/2017       Assessment & Plan:   1. Uncontrolled type 2 diabetes mellitus with hyperglycemia ([place])  - Marlet C Hoes has currently uncontrolled symptomatic type 2 DM since  52 years of age. -This patient continues to struggle to control her diabetes to target.  -She recently reappeared with A1c of > 14%, increasing from 12.9% during her last visit.   -her diabetes is complicated by obesity/ sedentary life and Carolyn C Crafton remains at extremely high risk for more acute and chronic complications which  include CAD, CVA, CKD, retinopathy, and neuropathy. These are all discussed in detail with the patient.  - I have counseled her on diet management and weight loss, by adopting a carbohydrate restricted/protein rich diet.  She admits to continued consumption of large quantities of soda.  -  Suggestion is made for her to avoid simple carbohydrates  from her diet including Cakes, Sweet Desserts / Pastries, Ice Cream, Soda (diet and regular), Sweet Tea, Candies, Chips, Cookies, Store Bought Juices, Alcohol in Excess of  1-2 drinks a day, Artificial Sweeteners, and "Sugar-free" Products. This will help patient to have stable blood glucose profile and potentially avoid unintended weight gain.  - I encouraged her to switch to  unprocessed or minimally processed complex starch and increased protein intake (animal or plant source), fruits, and vegetables.  - she is advised to stick to a routine mealtimes to eat 3 meals  a day and avoid unnecessary snacks ( to snack only to correct hypoglycemia).   - I have approached her with the following individualized plan to manage diabetes and patient agrees:    - Based on her current presentation with severe and persistent hyperglycemia despite large dose of basal/bolus insulin, she will be switched to insulin U500 in hope of better absorption and better compliance.    -I discussed and initiated Humulin U500 60 units 3 times daily AC, 20 minutes before meals, associated with strict monitoring of blood glucose 4 times a day minimum.  She wears the freestyle libre device for CGM, encouraged to use it properly.   -She reports that she has no more [place] and NovoLog in the house, advised not to use any other kind of insulin while taking insulin U500.   -She is encouraged to call clinic for blood glucose levels less than 70 or above 300 mg /dl. -She is advised to continue metformin 1000 mg p.o. twice daily, therapeutically suitable for patient . -She has tolerated  Trulicity , advised to continue Trulicity 1.5 mg subcutaneously weekly.    Side effects and precautions discussed with her.  - Patient specific target  A1c;  LDL, HDL, Triglycerides, and  Waist Circumference were discussed in detail.  2) BP/HTN: Her blood pressure is controlled to target.    She is advised to continue her current blood pressure medications including lisinopril 2.5 mg p.o. Daily.   3) Lipids/HPL: Her last lipid panel showed improving LDL of 116, improving from 141.  She is advised to continue pravastatin 20 mg p.o. nightly.        Side effects and precautions discussed with her.   4)  Weight/Diet: CDE Consult is pending  , exercise, and detailed carbohydrates information provided.  5) Chronic Care/Health Maintenance:  -she  is on ACEI/ARB and is encouraged to continue to follow up with Ophthalmology, Dentist,  Podiatrist at least yearly or according to recommendations, and advised to   stay away from smoking. I have recommended yearly flu vaccine and pneumonia vaccination at least every 5 years; moderate intensity exercise for up to 150 minutes weekly; and  sleep for at least 7 hours a day.  - I advised patient to maintain close follow up with [employer], MD for primary care needs.  - Time spent with the patient: 25 min, of which >50% was spent in reviewing her blood glucose logs , discussing her hypo- and hyper-glycemic episodes, reviewing her current and  previous labs and insulin doses and developing a plan to avoid hypo- and hyper-glycemia. Please refer to Patient Instructions for Blood Glucose Monitoring and Insulin/Medications Dosing Guide"  in media tab for additional information. Larisa Kirt Boys participated in the discussions, expressed understanding, and voiced agreement with the above plans.  All questions were answered to her satisfaction. she is encouraged to contact clinic should she have any questions or concerns prior to her return visit.   Follow up plan: -  Return in about 2 weeks (around 02/03/2018) for Follow up with Meter and Logs Only - no Labs.  Glade Lloyd, MD [facility] [address] [place], [place] [address] Phone: [phone]  Fax: [phone]    01/20/2018, 12:39 PM  This note was partially dictated with voice recognition software. Similar sounding words can be transcribed inadequately or may not  be corrected upon review.

## 2018-01-20 NOTE — Patient Instructions (Signed)

## 2018-02-03 ENCOUNTER — Encounter: Payer: Self-pay | Admitting: "Endocrinology

## 2018-02-03 ENCOUNTER — Ambulatory Visit (INDEPENDENT_AMBULATORY_CARE_PROVIDER_SITE_OTHER): Payer: BLUE CROSS/BLUE SHIELD | Admitting: "Endocrinology

## 2018-02-03 VITALS — BP 119/76 | HR 101 | Ht 65.0 in | Wt 197.0 lb

## 2018-02-03 DIAGNOSIS — I10 Essential (primary) hypertension: Secondary | ICD-10-CM | POA: Diagnosis not present

## 2018-02-03 DIAGNOSIS — E782 Mixed hyperlipidemia: Secondary | ICD-10-CM

## 2018-02-03 DIAGNOSIS — E66812 Obesity, class 2: Secondary | ICD-10-CM

## 2018-02-03 DIAGNOSIS — E1165 Type 2 diabetes mellitus with hyperglycemia: Secondary | ICD-10-CM

## 2018-02-03 DIAGNOSIS — Z6835 Body mass index (BMI) 35.0-35.9, adult: Secondary | ICD-10-CM

## 2018-02-03 MED ORDER — INSULIN REGULAR HUMAN (CONC) 500 UNIT/ML ~~LOC~~ SOPN: 80.0000 [IU] | PEN_INJECTOR | Freq: Three times a day (TID) | SUBCUTANEOUS | 2 refills | Status: DC

## 2018-02-03 NOTE — Progress Notes (Signed)
Endocrinology Follow Up Note                                                                    02/03/2018, 5:05 PM   Subjective:    Patient ID: [name], female    DOB: 12-25-1965.  [name] is being seen in  follow up for management of currently uncontrolled symptomatic 2 diabetes, hyperlipidemia, hypertension. PMD:   [employer], MD.   Past Medical History:  Diagnosis Date  . Asthma   . Chronic abdominal pain   . Chronic neck pain    C4-6 fusion  . CONSTIPATION 05/16/2009   Qualifier: Diagnosis of  By: Ronnald Ramp FNP-BC, Kandice L   . Diabetes mellitus without complication ([place])   . Dysphagia 02/23/2012  . Fatty liver   . GERD (gastroesophageal reflux disease)   . Helicobacter pylori gastritis 2011  . Hyperlipidemia   . Hypertension   . LIVER FUNCTION TESTS, ABNORMAL, HX OF 05/14/2009   Qualifier: Diagnosis of  By: Stephan Minister    . Migraine headache   . MIGRAINE, COMMON 05/14/2009   Qualifier: Diagnosis of  By: Stephan Minister    . MS (multiple sclerosis) ([place])   . Sleep apnea    Past Surgical History:  Procedure Laterality Date  . ABDOMINAL HYSTERECTOMY    . APPENDECTOMY    . BACK SURGERY  03/07/2012   Nerve Stimulator  . CHOLECYSTECTOMY    . COLONOSCOPY  07/09/2009   RMR; normal rectum aside from anal canal hemorrhoids/scattered left-sided diverticula  . ESOPHAGOGASTRODUODENOSCOPY  05/22/2009   RMR; normal /small HH  . ESOPHAGOGASTRODUODENOSCOPY  2007   RMR: non-critical Schatzki's rin, non-maniuplated  . ESOPHAGOGASTRODUODENOSCOPY N/A 10/24/2013   Procedure: ESOPHAGOGASTRODUODENOSCOPY (EGD);  Surgeon: Daneil Dolin, MD;  Location: [facility];  Service: Endoscopy;  Laterality: N/A;  9:45  . ESOPHAGOGASTRODUODENOSCOPY (EGD) WITH ESOPHAGEAL DILATION  03/17/2012   MHW:[id number] appearing esophageal mucosa of uncertain significance. Status post Venia Minks dilation followed by esophageal bx  . MALONEY  DILATION N/A 10/24/2013   Procedure: Venia Minks DILATION;  Surgeon: Daneil Dolin, MD;  Location: [facility];  Service: Endoscopy;  Laterality: N/A;  . MANDIBLE SURGERY    . NECK SURGERY     X2, last time 2013   Social History   Socioeconomic History  . Marital status: Married    Spouse name: Not on file  . Number of children: Not on file  . Years of education: Not on file  . Highest education level: Not on file  Occupational History  . Not on file  Social Needs  . Financial resource strain: Not on file  . Food insecurity:    Worry: Not on file    Inability: Not on file  . Transportation needs:    Medical: Not on file    Non-medical: Not on file  Tobacco Use  . Smoking status: Never Smoker  . Smokeless tobacco: Never Used  Substance and Sexual Activity  . Alcohol use: No  . Drug use: No  .  Sexual activity: Yes    Birth control/protection: Surgical  Lifestyle  . Physical activity:    Days per week: Not on file    Minutes per session: Not on file  . Stress: Not on file  Relationships  . Social connections:    Talks on phone: Not on file    Gets together: Not on file    Attends religious service: Not on file    Active member of club or organization: Not on file    Attends meetings of clubs or organizations: Not on file    Relationship status: Not on file  Other Topics Concern  . Not on file  Social History Narrative   5 kids, raising grandchild   Outpatient Encounter Medications as of 02/03/2018  Medication Sig  . aspirin EC 81 MG tablet Take 81 mg by mouth daily.  . baclofen (LIORESAL) 20 MG tablet Take 20 mg by mouth 3 (three) times daily.   . Blood Glucose Monitoring Suppl (ACCU-CHEK GUIDE) w/Device KIT 1 Piece by Does not apply route as directed.  . citalopram (CELEXA) 40 MG tablet Take 40 mg by mouth every evening.   . Continuous Blood Gluc Sensor (FREESTYLE LIBRE 14 DAY SENSOR) MISC Inject 1 each into the skin every 14 (fourteen) days. Use as directed.  Marland Kitchen  COPAXONE 40 MG/ML SOSY Inject into the muscle 3 (three) times a week.  . Dulaglutide (TRULICITY) 1.5 DP/8.2UM SOPN Inject 1.5 mg into the skin once a week.  . gabapentin (NEURONTIN) 300 MG capsule Take 600 mg by mouth 3 (three) times daily.   Marland Kitchen glucose blood (ACCU-CHEK GUIDE) test strip 1 each by Other route 4 (four) times daily. Use as instructed 4 x daily. E11.65  . Insulin Pen Needle (B-D ULTRAFINE III SHORT PEN) 31G X 8 MM MISC 1 each by Does not apply route as directed.  . insulin regular human CONCENTRATED (HUMULIN R U-500 KWIKPEN) 500 UNIT/ML kwikpen Inject 80 Units into the skin 3 (three) times daily with meals.  . Lancets MISC 1 each by Does not apply route as directed.  Marland Kitchen lisinopril (PRINIVIL,ZESTRIL) 2.5 MG tablet Take 2.5 mg by mouth daily.  . metFORMIN (GLUCOPHAGE) 500 MG tablet Take 1,000 mg by mouth 2 (two) times daily.  Marland Kitchen oxybutynin (DITROPAN) 5 MG tablet Take 5 mg by mouth 2 (two) times daily.  . pravastatin (PRAVACHOL) 20 MG tablet Take 1 tablet (20 mg total) by mouth every evening.  . [DISCONTINUED] insulin regular human CONCENTRATED (HUMULIN R U-500 KWIKPEN) 500 UNIT/ML kwikpen Inject 60 Units into the skin 3 (three) times daily with meals.   No facility-administered encounter medications on file as of 02/03/2018.     ALLERGIES: Allergies  Allergen Reactions  . Peanut-Containing Drug Products Anaphylaxis and Hives    Patient states she is allergic to all nuts  . Gluten Meal   . Latex Other (See Comments)    Reaction: blistering  . Prednisone Hives and Other (See Comments)    Reaction: causes psychological issues   . Nexium [Esomeprazole Magnesium] Hives  . Tape Rash    Paper tape please    VACCINATION STATUS: Immunization History  Administered Date(s) Administered  . Influenza Split 04/17/2012  . Pneumococcal Polysaccharide-23 04/17/2012    Diabetes  She presents for her follow-up diabetic visit. She has type 2 diabetes mellitus. Onset time: She was diagnosed  at approximate age of 64 years. Her disease course has been worsening. There are no hypoglycemic associated symptoms. Pertinent negatives for hypoglycemia include  no confusion, headaches, pallor or seizures. Associated symptoms include blurred vision, fatigue, polydipsia and polyuria. Pertinent negatives for diabetes include no chest pain and no polyphagia. There are no hypoglycemic complications. Symptoms are worsening. There are no diabetic complications. Risk factors for coronary artery disease include diabetes mellitus, hypertension, obesity, sedentary lifestyle and dyslipidemia. Current diabetic treatment includes insulin injections. She is compliant with treatment most of the time. Her weight is fluctuating minimally. She is following a generally healthy (she admits to consumption of large quantities of soda.) diet. When asked about meal planning, she reported none. She has not had a previous visit with a dietitian. She never participates in exercise. Her home blood glucose trend is increasing steadily. Her breakfast blood glucose range is generally >200 mg/dl. Her lunch blood glucose range is generally >200 mg/dl. Her dinner blood glucose range is generally >200 mg/dl. Her bedtime blood glucose range is generally >200 mg/dl. Her overall blood glucose range is >200 mg/dl. (She returns with persistent and severe hyperglycemia, her recent labs showed  A1c is >14% increasing from 12.9%.  He failed to call clinic for extremely high blood glucose readings. ) An ACE inhibitor/angiotensin II receptor blocker is being taken.  Hypertension  This is a chronic problem. The current episode started more than 1 year ago. The problem is controlled. Associated symptoms include blurred vision. Pertinent negatives include no chest pain, headaches, palpitations or shortness of breath. Risk factors for coronary artery disease include obesity, sedentary lifestyle, diabetes mellitus and family history. Past treatments include  ACE inhibitors.  Hyperlipidemia  This is a chronic problem. The current episode started more than 1 year ago. The problem is uncontrolled. Exacerbating diseases include diabetes and obesity. Pertinent negatives include no chest pain, myalgias or shortness of breath. Current antihyperlipidemic treatment includes statins. Risk factors for coronary artery disease include dyslipidemia, diabetes mellitus, hypertension, obesity and a sedentary lifestyle.    Review of Systems  Constitutional: Positive for fatigue. Negative for chills, fever and unexpected weight change.  HENT: Negative for trouble swallowing and voice change.   Eyes: Positive for blurred vision. Negative for visual disturbance.  Respiratory: Negative for cough, shortness of breath and wheezing.   Cardiovascular: Negative for chest pain, palpitations and leg swelling.  Gastrointestinal: Negative for diarrhea, nausea and vomiting.  Endocrine: Positive for polydipsia and polyuria. Negative for cold intolerance, heat intolerance and polyphagia.  Musculoskeletal: Negative for arthralgias and myalgias.  Skin: Negative for color change, pallor, rash and wound.  Neurological: Negative for seizures and headaches.  Psychiatric/Behavioral: Negative for confusion and suicidal ideas.    Objective:    BP 119/76   Pulse (!) 101   Ht 5' 5" (1.651 m)   Wt 197 lb (89.4 kg)   BMI 32.78 kg/m   Wt Readings from Last 3 Encounters:  02/03/18 197 lb (89.4 kg)  01/20/18 198 lb (89.8 kg)  01/03/18 198 lb (89.8 kg)     Physical Exam  Constitutional: She is oriented to person, place, and time. She appears well-developed.  Obese.   HENT:  Head: Normocephalic and atraumatic.  Eyes: EOM are normal.  Neck: Normal range of motion. Neck supple. No tracheal deviation present. No thyromegaly present.  Cardiovascular: Normal rate and regular rhythm.  Pulmonary/Chest: Effort normal. No respiratory distress.  Abdominal: Soft. There is no tenderness.  There is no guarding.  Musculoskeletal: Normal range of motion. She exhibits no edema.  Neurological: She is alert and oriented to person, place, and time. No cranial nerve deficit. Coordination normal.  Skin: Skin is warm and dry. No rash noted. No erythema. No pallor.  Diffuse tattoos  Psychiatric: She has a normal mood and affect.    CMP     Component Value Date/Time   NA 135 12/14/2017 0922   K 4.1 12/14/2017 0922   CL 99 12/14/2017 0922   CO2 27 12/14/2017 0922   GLUCOSE 334 (H) 12/14/2017 0922   BUN 16 12/14/2017 0922   CREATININE 0.66 12/14/2017 0922   CALCIUM 9.1 12/14/2017 0922   PROT 7.2 12/14/2017 0922   ALBUMIN 3.6 06/26/2014 1702   AST 43 (H) 12/14/2017 0922   ALT 46 (H) 12/14/2017 0922   ALKPHOS 128 (H) 06/26/2014 1702   BILITOT 0.4 12/14/2017 0922   GFRNONAA 102 12/14/2017 0922   GFRAA 118 12/14/2017 0922     Diabetic Labs (most recent): Lab Results  Component Value Date   HGBA1C >14.0 (H) 12/14/2017   HGBA1C 12.9 (H) 08/19/2017   HGBA1C 12.0 (H) 04/28/2017     Lipid Panel ( most recent) Lipid Panel     Component Value Date/Time   CHOL 200 (H) 04/28/2017 1101   TRIG 137 04/28/2017 1101   HDL 59 04/28/2017 1101   CHOLHDL 3.4 04/28/2017 1101   VLDL 28 04/17/2012 0644   LDLCALC 116 (H) 04/28/2017 1101      Lab Results  Component Value Date   TSH 0.31 (L) 04/28/2017   TSH 0.667 04/16/2012   FREET4 1.1 04/28/2017       Assessment & Plan:   1. Uncontrolled type 2 diabetes mellitus with hyperglycemia ([place])  - [name] has currently uncontrolled symptomatic type 2 DM since  52 years of age. -This patient continues to struggle to control her diabetes to target, despite relatively large dose of insulin U500.   -She recently reappeared with A1c of > 14%, increasing from 12.9% during her last visit.   -her diabetes is complicated by obesity/ sedentary life and [name] remains at extremely high risk for more acute and chronic complications  which include CAD, CVA, CKD, retinopathy, and neuropathy. These are all discussed in detail with the patient.  - I have counseled her on diet management and weight loss, by adopting a carbohydrate restricted/protein rich diet.  She admits to continued consumption of large quantities of soda.  -  Suggestion is made for her to avoid simple carbohydrates  from her diet including Cakes, Sweet Desserts / Pastries, Ice Cream, Soda (diet and regular), Sweet Tea, Candies, Chips, Cookies, Store Bought Juices, Alcohol in Excess of  1-2 drinks a day, Artificial Sweeteners, and "Sugar-free" Products. This will help patient to have stable blood glucose profile and potentially avoid unintended weight gain.  - I encouraged her to switch to  unprocessed or minimally processed complex starch and increased protein intake (animal or plant source), fruits, and vegetables.  - she is advised to stick to a routine mealtimes to eat 3 meals  a day and avoid unnecessary snacks ( to snack only to correct hypoglycemia).   - I have approached her with the following individualized plan to manage diabetes and patient agrees:    - Based on her current presentation with severe and persistent hyperglycemia despite large dose of insulin U500, she will require significantly higher dose.    -I discussed and increased  Humulin U500 to 80 units 3 times daily AC, 20 minutes before meals, associated with strict monitoring of blood glucose 4 times a day minimum.  She wears the freestyle  libre device for CGM, encouraged to use it properly.   -She is advised not to use any other kind of insulin while taking insulin U500.    -Once again, she is encouraged to call clinic for blood glucose levels less than 70 or above 300 mg /dl. -She is advised to continue metformin 1000 mg p.o. twice daily, therapeutically suitable for patient . -She also advised to continue Trulicity 1.5 mg subcutaneously weekly.    Side effects and precautions discussed  with her.  - Patient specific target  A1c;  LDL, HDL, Triglycerides, and  Waist Circumference were discussed in detail.  2) BP/HTN: Her blood pressure is controlled to target.    She is advised to continue her current blood pressure medications including lisinopril 2.5 mg p.o. Daily.   3) Lipids/HPL: Her last lipid panel showed improving LDL of 116, improving from 141.  She is advised to continue pravastatin 20 mg p.o. nightly.        Side effects and precautions discussed with her.  -She will be considered for high intensity statin, atorvastatin 80 mg nightly on subsequent visits.   4)  Weight/Diet: CDE Consult is pending  , exercise, and detailed carbohydrates information provided.  5) Chronic Care/Health Maintenance:  -she  is on ACEI/ARB and is encouraged to continue to follow up with Ophthalmology, Dentist,  Podiatrist at least yearly or according to recommendations, and advised to   stay away from smoking. I have recommended yearly flu vaccine and pneumonia vaccination at least every 5 years; moderate intensity exercise for up to 150 minutes weekly; and  sleep for at least 7 hours a day.  - I advised patient to maintain close follow up with [employer], MD for primary care needs.  - Time spent with the patient: 25 min, of which >50% was spent in reviewing her blood glucose logs , discussing her hypo- and hyper-glycemic episodes, reviewing her current and  previous labs and insulin doses and developing a plan to avoid hypo- and hyper-glycemia. Please refer to Patient Instructions for Blood Glucose Monitoring and Insulin/Medications Dosing Guide"  in media tab for additional information. [name] participated in the discussions, expressed understanding, and voiced agreement with the above plans.  All questions were answered to her satisfaction. she is encouraged to contact clinic should she have any questions or concerns prior to her return visit.   Follow up plan: - Return in  about 4 weeks (around 03/03/2018) for Follow up with Meter and Logs Only - no Labs.  Glade Lloyd, MD [facility] [address] [place], [place] [address] Phone: [phone]  Fax: [phone]    02/03/2018, 5:05 PM  This note was partially dictated with voice recognition software. Similar sounding words can be transcribed inadequately or may not  be corrected upon review.

## 2018-02-03 NOTE — Patient Instructions (Signed)

## 2018-02-23 ENCOUNTER — Other Ambulatory Visit: Payer: Self-pay

## 2018-02-23 ENCOUNTER — Encounter (HOSPITAL_COMMUNITY): Payer: Self-pay | Admitting: Emergency Medicine

## 2018-02-23 ENCOUNTER — Emergency Department (HOSPITAL_COMMUNITY)
Admission: EM | Admit: 2018-02-23 | Discharge: 2018-02-24 | Disposition: A | Discharge status: Home / Self Care | Payer: BLUE CROSS/BLUE SHIELD | Attending: Emergency Medicine | Admitting: Emergency Medicine

## 2018-02-23 DIAGNOSIS — I10 Essential (primary) hypertension: Secondary | ICD-10-CM | POA: Insufficient documentation

## 2018-02-23 DIAGNOSIS — E119 Type 2 diabetes mellitus without complications: Secondary | ICD-10-CM | POA: Insufficient documentation

## 2018-02-23 DIAGNOSIS — Z794 Long term (current) use of insulin: Secondary | ICD-10-CM | POA: Insufficient documentation

## 2018-02-23 DIAGNOSIS — R0600 Dyspnea, unspecified: Secondary | ICD-10-CM | POA: Diagnosis present

## 2018-02-23 DIAGNOSIS — Z79899 Other long term (current) drug therapy: Secondary | ICD-10-CM | POA: Insufficient documentation

## 2018-02-23 DIAGNOSIS — Z91010 Allergy to peanuts: Secondary | ICD-10-CM | POA: Diagnosis not present

## 2018-02-23 DIAGNOSIS — J45909 Unspecified asthma, uncomplicated: Secondary | ICD-10-CM | POA: Diagnosis not present

## 2018-02-23 DIAGNOSIS — Z7902 Long term (current) use of antithrombotics/antiplatelets: Secondary | ICD-10-CM | POA: Diagnosis not present

## 2018-02-23 DIAGNOSIS — Z91040 Latex allergy status: Secondary | ICD-10-CM | POA: Insufficient documentation

## 2018-02-23 DIAGNOSIS — Z7982 Long term (current) use of aspirin: Secondary | ICD-10-CM | POA: Diagnosis not present

## 2018-02-23 DIAGNOSIS — T7840XA Allergy, unspecified, initial encounter: Secondary | ICD-10-CM | POA: Insufficient documentation

## 2018-02-23 MED ORDER — DIPHENHYDRAMINE HCL 50 MG/ML IJ SOLN
25.0000 mg | Freq: Once | INTRAMUSCULAR | Status: AC
  Administered 2018-02-23: 25 mg via INTRAVENOUS
  Filled 2018-02-23: qty 1

## 2018-02-23 MED ORDER — DEXAMETHASONE SODIUM PHOSPHATE 10 MG/ML IJ SOLN
10.0000 mg | Freq: Once | INTRAMUSCULAR | Status: AC
  Administered 2018-02-23: 10 mg via INTRAVENOUS
  Filled 2018-02-23: qty 1

## 2018-02-23 MED ORDER — EPINEPHRINE 0.3 MG/0.3ML IJ SOAJ
0.3000 mg | Freq: Once | INTRAMUSCULAR | Status: AC
  Administered 2018-02-23: 0.3 mg via INTRAMUSCULAR
  Filled 2018-02-23: qty 0.3

## 2018-02-23 MED ORDER — FAMOTIDINE IN NACL 20-0.9 MG/50ML-% IV SOLN
20.0000 mg | Freq: Once | INTRAVENOUS | Status: AC
  Administered 2018-02-23: 20 mg via INTRAVENOUS
  Filled 2018-02-23: qty 50

## 2018-02-23 NOTE — ED Triage Notes (Signed)
Pt c/o allergic reaction to shrimp, states feels like her throat is closing and feels SOB, pt states she has had a reaction 1 other time to shellfish, EDP notified

## 2018-02-24 MED ORDER — EPINEPHRINE 0.3 MG/0.3ML IJ SOAJ: 0.3000 mg | Freq: Once | INTRAMUSCULAR | 0 refills | Status: AC

## 2018-02-24 MED ORDER — FAMOTIDINE 20 MG PO TABS: 20.0000 mg | ORAL_TABLET | Freq: Two times a day (BID) | ORAL | 0 refills | Status: DC

## 2018-02-24 MED ORDER — DEXAMETHASONE 1.5 MG (21) PO TBPK: 1.5000 mg | ORAL_TABLET | Freq: Four times a day (QID) | ORAL | 0 refills | Status: DC

## 2018-02-24 MED ORDER — DIPHENHYDRAMINE HCL 50 MG/ML IJ SOLN
25.0000 mg | Freq: Once | INTRAMUSCULAR | Status: AC
  Administered 2018-02-24: 25 mg via INTRAVENOUS
  Filled 2018-02-24: qty 1

## 2018-02-24 NOTE — Discharge Instructions (Addendum)
Avoid shrimp in the future!  Avoid eating or drinking anything hot, that may make the swelling worse again.  Take the medication as prescribed until gone.  You can take Benadryl as needed for itching or swelling, 25 to 50 mg every 6 hours as needed.  Return to the emergency department if you are getting worse instead of better.  Use the EpiPen if you have throat swelling again but proceed to the emergency department afterwards.

## 2018-02-24 NOTE — ED Provider Notes (Signed)
[facility] EMERGENCY DEPARTMENT Provider Note   CSN: [id number] Arrival date & time: 02/23/18  2331  Time seen 23:37PM   History   Chief Complaint Chief Complaint  Patient presents with  . Allergic Reaction    HPI [name] is a 52 y.o. female.  HPI patient states about 15 to 20 minutes prior to arrival she was eating some shrimp and she immediately started having trouble breathing and feeling like her throat was swelling and itching.  She denies any itching of her skin or rash of her skin.  She does denies chest pain.  She states she has never had this reaction before to shrimp and she is been eating shrimp for years.  She states nothing was different about tonight.  She has had allergies in the past and states she is allergic to Benadryl.  When I reviewed her chart she had Decadron in 2013 and she states she thinks that was without difficulty.  Patient's voice is hoarse and whispery.  PCP Redmond School, MD   Past Medical History:  Diagnosis Date  . Asthma   . Chronic abdominal pain   . Chronic neck pain    C4-6 fusion  . CONSTIPATION 05/16/2009   Qualifier: Diagnosis of  By: Ronnald Ramp FNP-BC, Kandice L   . Diabetes mellitus without complication ([place])   . Dysphagia 02/23/2012  . Fatty liver   . GERD (gastroesophageal reflux disease)   . Helicobacter pylori gastritis 2011  . Hyperlipidemia   . Hypertension   . LIVER FUNCTION TESTS, ABNORMAL, HX OF 05/14/2009   Qualifier: Diagnosis of  By: Stephan Minister    . Migraine headache   . MIGRAINE, COMMON 05/14/2009   Qualifier: Diagnosis of  By: Stephan Minister    . MS (multiple sclerosis) ([place])   . Sleep apnea     Patient Active Problem List   Diagnosis Date Noted  . Vitamin D deficiency 08/26/2017  . Uncontrolled type 2 diabetes mellitus with hyperglycemia ([place]) 03/15/2017  . Essential hypertension, benign 03/15/2017  . Class 2 severe obesity due to excess calories with serious comorbidity and body mass index (BMI) of  35.0 to 35.9 in adult ([place]) 03/15/2017  . Spondylosis of thoracic region without myelopathy or radiculopathy 07/02/2014  . Abdominal pain, epigastric 10/19/2013  . Unspecified constipation 10/19/2013  . Chest pain 04/16/2012  . Abnormal EKG 04/16/2012  . HTN (hypertension) 04/16/2012  . Mixed hyperlipidemia 04/16/2012  . Multiple sclerosis ([place]) 04/16/2012  . 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 Dysphagia 02/23/2012  . FATTY LIVER DISEASE 05/16/2009  . HELICOBACTER PYLORI GASTRITIS 05/14/2009  . ASTHMA 05/14/2009  . Esophageal reflux 05/14/2009  . NAUSEA 05/14/2009  . Diarrhea 05/14/2009  . DEPRESSION, HX OF 05/14/2009    Past Surgical History:  Procedure Laterality Date  . ABDOMINAL HYSTERECTOMY    . APPENDECTOMY    . BACK SURGERY  03/07/2012   Nerve Stimulator  . CHOLECYSTECTOMY    . COLONOSCOPY  07/09/2009   RMR; normal rectum aside from anal canal hemorrhoids/scattered left-sided diverticula  . ESOPHAGOGASTRODUODENOSCOPY  05/22/2009   RMR; normal /small HH  . ESOPHAGOGASTRODUODENOSCOPY  2007   RMR: non-critical Schatzki's rin, non-maniuplated  . ESOPHAGOGASTRODUODENOSCOPY N/A 10/24/2013   Procedure: ESOPHAGOGASTRODUODENOSCOPY (EGD);  Surgeon: Daneil Dolin, MD;  Location: [facility];  Service: Endoscopy;  Laterality: N/A;  9:45  . ESOPHAGOGASTRODUODENOSCOPY (EGD) WITH ESOPHAGEAL DILATION  03/17/2012   XVQ:[id number] appearing esophageal mucosa of uncertain significance. Status post Venia Minks dilation followed by  esophageal bx  . MALONEY DILATION N/A 10/24/2013   Procedure: Venia Minks DILATION;  Surgeon: Daneil Dolin, MD;  Location: [facility];  Service: Endoscopy;  Laterality: N/A;  . MANDIBLE SURGERY    . NECK SURGERY     X2, last time 2013     OB History    Gravida  5   Para  5   Term  5   Preterm      AB      Living        SAB      TAB      Ectopic      Multiple      Live Births               Home Medications     Prior to Admission medications   Medication Sig Start Date End Date Taking? Authorizing Provider  aspirin EC 81 MG tablet Take 81 mg by mouth daily.    [provider]  baclofen (LIORESAL) 20 MG tablet Take 20 mg by mouth 3 (three) times daily.  05/08/14   [provider]  Blood Glucose Monitoring Suppl (ACCU-CHEK GUIDE) w/Device KIT 1 Piece by Does not apply route as directed. 03/15/17   Cassandria Anger, MD  citalopram (CELEXA) 40 MG tablet Take 40 mg by mouth every evening.     [provider]  Continuous Blood Gluc Sensor (FREESTYLE LIBRE 14 DAY SENSOR) MISC Inject 1 each into the skin every 14 (fourteen) days. Use as directed. 12/14/17   Cassandria Anger, MD  COPAXONE 40 MG/ML SOSY Inject into the muscle 3 (three) times a week. 10/13/13   [provider]  Dexamethasone (DEXPAK 6 DAY) 1.5 MG (21) TBPK Take 1.5 mg by mouth taper from 4 doses each day to 1 dose and stop for 6 days. Take the directions on the dosepak 02/24/18 03/02/18  Rolland Porter, MD  Dulaglutide (TRULICITY) 1.5 YQ/0.3KV SOPN Inject 1.5 mg into the skin once a week. 08/26/17   Cassandria Anger, MD  EPINEPHrine (EPIPEN 2-PAK) 0.3 mg/0.3 mL IJ SOAJ injection Inject 0.3 mLs (0.3 mg total) into the muscle once for 1 dose. 02/24/18 02/24/18  Rolland Porter, MD  famotidine (PEPCID) 20 MG tablet Take 1 tablet (20 mg total) by mouth 2 (two) times daily. 02/24/18   Rolland Porter, MD  gabapentin (NEURONTIN) 300 MG capsule Take 600 mg by mouth 3 (three) times daily.     [provider]  glucose blood (ACCU-CHEK GUIDE) test strip 1 each by Other route 4 (four) times daily. Use as instructed 4 x daily. E11.65 08/26/17   Cassandria Anger, MD  Insulin Pen Needle (B-D ULTRAFINE III SHORT PEN) 31G X 8 MM MISC 1 each by Does not apply route as directed. 03/31/17   Cassandria Anger, MD  insulin regular human CONCENTRATED (HUMULIN R U-500 KWIKPEN) 500 UNIT/ML kwikpen Inject 80 Units into the skin  3 (three) times daily with meals. 02/03/18   Cassandria Anger, MD  Lancets MISC 1 each by Does not apply route as directed. 05/25/17   Cassandria Anger, MD  lisinopril (PRINIVIL,ZESTRIL) 2.5 MG tablet Take 2.5 mg by mouth daily.    [provider]  metFORMIN (GLUCOPHAGE) 500 MG tablet Take 1,000 mg by mouth 2 (two) times daily.    [provider]  oxybutynin (DITROPAN) 5 MG tablet Take 5 mg by mouth 2 (two) times daily. 07/02/14   [provider]  pravastatin (PRAVACHOL)  20 MG tablet Take 1 tablet (20 mg total) by mouth every evening. 04/06/17 07/05/17  Arnoldo Lenis, MD    Family History Family History  Problem Relation Age of Onset  . Cancer Mother        unknown type  . Diabetes Father   . Diabetes Sister   . Colon cancer Neg Hx     Social History Social History   Tobacco Use  . Smoking status: Never Smoker  . Smokeless tobacco: Never Used  Substance Use Topics  . Alcohol use: No  . Drug use: No  lives with spouse   Allergies   Peanut-containing drug products; Shellfish allergy; Gluten meal; Latex; Prednisone; Nexium [esomeprazole magnesium]; and Tape   Review of Systems Review of Systems  All other systems reviewed and are negative.    Physical Exam Updated Vital Signs BP (!) 127/54   Pulse 90   Ht _0  (1.651 m)   Wt 89.3 kg   SpO2 95%   BMI 32.76 kg/m   Physical Exam  Constitutional: She is oriented to person, place, and time. She appears well-developed and well-nourished.  Non-toxic appearance. She does not appear ill. No distress.  HENT:  Head: Normocephalic and atraumatic.  Right Ear: External ear normal.  Left Ear: External ear normal.  Nose: Nose normal. No mucosal edema or rhinorrhea.  Mouth/Throat: Mucous membranes are normal. No dental abscesses or uvula swelling. Posterior oropharyngeal erythema present.  Voice is whispery  Eyes: Pupils are equal, round, and reactive to light. Conjunctivae and EOM are normal.   Neck: Normal range of motion and full passive range of motion without pain. Neck supple.  Cardiovascular: Normal rate, regular rhythm and normal heart sounds. Exam reveals no gallop and no friction rub.  No murmur heard. Pulmonary/Chest: Effort normal. No stridor. No respiratory distress. She has decreased breath sounds. She has no wheezes. She has no rhonchi. She has no rales. She exhibits no tenderness and no crepitus.  Abdominal: Soft. Normal appearance and bowel sounds are normal. She exhibits no distension. There is no tenderness. There is no rebound and no guarding.  Musculoskeletal: Normal range of motion. She exhibits no edema or tenderness.  Moves all extremities well.   Neurological: She is alert and oriented to person, place, and time. She has normal strength. No cranial nerve deficit.  Skin: Skin is warm, dry and intact. No rash noted. No erythema. No pallor.  Psychiatric: She has a normal mood and affect. Her speech is normal and behavior is normal. Her mood appears not anxious.  Nursing note and vitals reviewed.    ED Treatments / Results  Labs (all labs ordered are listed, but only abnormal results are displayed) Labs Reviewed - No data to display  EKG None  Radiology No results found.  Procedures .Critical Care Performed by: Rolland Porter, MD Authorized by: Rolland Porter, MD   Critical care provider statement:    Critical care time (minutes):  35   Critical care was necessary to treat or prevent imminent or life-threatening deterioration of the following conditions:  Respiratory failure   Critical care was time spent personally by me on the following activities:  Examination of patient, obtaining history from patient or surrogate, pulse oximetry and re-evaluation of patient's condition   (including critical care time)  Medications Ordered in ED Medications  famotidine (PEPCID) IVPB 20 mg premix (0 mg Intravenous Stopped 02/24/18 0020)  diphenhydrAMINE (BENADRYL)  injection 25 mg (25 mg Intravenous Given 02/23/18 2343)  dexamethasone (  DECADRON) injection 10 mg (10 mg Intravenous Given 02/23/18 2343)  EPINEPHrine (EPI-PEN) injection 0.3 mg (0.3 mg Intramuscular Given 02/23/18 2343)  diphenhydrAMINE (BENADRYL) injection 25 mg (25 mg Intravenous Given 02/24/18 0023)     Initial Impression / Assessment and Plan / ED Course  I have reviewed the triage vital signs and the nursing notes.  Pertinent labs & imaging results that were available during my care of the patient were reviewed by me and considered in my medical decision making (see chart for details).    Patient was given IV Decadron, Pepcid, and Benadryl.  After determining she did not have chest pain and she did have some diastolic hypertension however her systolic blood pressure was not elevated she was given epinephrine IM.  Her blood pressure was 137/111.  Recheck at 12:10 AM patient states her throat is still itching a little bit but it is better.  She still has some whispery voice but better.  She still has some diffuse posterior erythema.  Blood pressure is now 127/71 with heart rate 93 and pulse ox 97%.  She was given an additional dose of Benadryl.  Recheck 1250 patient states the itching is almost gone.  She was able to drink water without difficulty.  She still has some hoarseness but it is improving.  Her blood pressure is 114/45, heart rate 88, pulse ox 96%.  Recheck at 1:45 AM patient is sleeping.  When she was awake and her voice is almost normal.  She states she feels good.  She wants to drink more fluids.  Her itching is gone.  Her blood pressure was 100/57, heart rate 89, pulse ox 98% while sleeping.  We discussed trying to avoid eating or drinking anything hot because that may make her swelling in her throat worse.  She was going to be discharged home on oral Decadron and Pepcid.  She should of course avoid shrimp in the future.  Final Clinical Impressions(s) / ED Diagnoses   Final  diagnoses:  Allergic reaction, initial encounter    ED Discharge Orders         Ordered    famotidine (PEPCID) 20 MG tablet  2 times daily     02/24/18 0216    Dexamethasone (DEXPAK 6 DAY) 1.5 MG (21) TBPK  (Dosepack) 4x daily tapering     02/24/18 0216    EPINEPHrine (EPIPEN 2-PAK) 0.3 mg/0.3 mL IJ SOAJ injection   Once     02/24/18 0816        OTC benadryl  Plan discharge  Rolland Porter, MD, Barbette Or, MD 02/24/18 [phone]

## 2018-03-02 ENCOUNTER — Ambulatory Visit: Payer: BLUE CROSS/BLUE SHIELD | Admitting: "Endocrinology

## 2018-03-02 ENCOUNTER — Encounter: Payer: Self-pay | Admitting: "Endocrinology

## 2018-03-02 VITALS — BP 121/70 | HR 88 | Ht 65.0 in | Wt 203.0 lb

## 2018-03-02 DIAGNOSIS — I10 Essential (primary) hypertension: Secondary | ICD-10-CM

## 2018-03-02 DIAGNOSIS — E1165 Type 2 diabetes mellitus with hyperglycemia: Secondary | ICD-10-CM

## 2018-03-02 DIAGNOSIS — E782 Mixed hyperlipidemia: Secondary | ICD-10-CM

## 2018-03-02 MED ORDER — INSULIN REGULAR HUMAN (CONC) 500 UNIT/ML ~~LOC~~ SOPN: 100.0000 [IU] | PEN_INJECTOR | Freq: Three times a day (TID) | SUBCUTANEOUS | 2 refills | Status: DC

## 2018-03-02 NOTE — Patient Instructions (Signed)

## 2018-03-02 NOTE — Progress Notes (Signed)
Endocrinology Follow Up Note                                                                    03/02/2018, 4:47 PM   Subjective:    Patient ID: [name], female    DOB: 04-20-1965.  [name] is being seen in  follow up for management of currently uncontrolled symptomatic 2 diabetes, hyperlipidemia, hypertension. PMD:   [employer], MD.   Past Medical History:  Diagnosis Date  . Asthma   . Chronic abdominal pain   . Chronic neck pain    C4-6 fusion  . CONSTIPATION 05/16/2009   Qualifier: Diagnosis of  By: Ronnald Ramp FNP-BC, Kandice L   . Diabetes mellitus without complication ([place])   . Dysphagia 02/23/2012  . Fatty liver   . GERD (gastroesophageal reflux disease)   . Helicobacter pylori gastritis 2011  . Hyperlipidemia   . Hypertension   . LIVER FUNCTION TESTS, ABNORMAL, HX OF 05/14/2009   Qualifier: Diagnosis of  By: Stephan Minister    . Migraine headache   . MIGRAINE, COMMON 05/14/2009   Qualifier: Diagnosis of  By: Stephan Minister    . MS (multiple sclerosis) ([place])   . Sleep apnea    Past Surgical History:  Procedure Laterality Date  . ABDOMINAL HYSTERECTOMY    . APPENDECTOMY    . BACK SURGERY  03/07/2012   Nerve Stimulator  . CHOLECYSTECTOMY    . COLONOSCOPY  07/09/2009   RMR; normal rectum aside from anal canal hemorrhoids/scattered left-sided diverticula  . ESOPHAGOGASTRODUODENOSCOPY  05/22/2009   RMR; normal /small HH  . ESOPHAGOGASTRODUODENOSCOPY  2007   RMR: non-critical Schatzki's rin, non-maniuplated  . ESOPHAGOGASTRODUODENOSCOPY N/A 10/24/2013   Procedure: ESOPHAGOGASTRODUODENOSCOPY (EGD);  Surgeon: Daneil Dolin, MD;  Location: [facility];  Service: Endoscopy;  Laterality: N/A;  9:45  . ESOPHAGOGASTRODUODENOSCOPY (EGD) WITH ESOPHAGEAL DILATION  03/17/2012   MVH:QIONGEXB appearing esophageal mucosa of uncertain significance. Status post Venia Minks dilation followed by esophageal bx  . MALONEY  DILATION N/A 10/24/2013   Procedure: Venia Minks DILATION;  Surgeon: Daneil Dolin, MD;  Location: [facility];  Service: Endoscopy;  Laterality: N/A;  . MANDIBLE SURGERY    . NECK SURGERY     X2, last time 2013   Social History   Socioeconomic History  . Marital status: Married    Spouse name: Not on file  . Number of children: Not on file  . Years of education: Not on file  . Highest education level: Not on file  Occupational History  . Not on file  Social Needs  . Financial resource strain: Not on file  . Food insecurity:    Worry: Not on file    Inability: Not on file  . Transportation needs:    Medical: Not on file    Non-medical: Not on file  Tobacco Use  . Smoking status: Never Smoker  . Smokeless tobacco: Never Used  Substance and Sexual Activity  . Alcohol use: No  . Drug use: No  .  Sexual activity: Yes    Birth control/protection: Surgical  Lifestyle  . Physical activity:    Days per week: Not on file    Minutes per session: Not on file  . Stress: Not on file  Relationships  . Social connections:    Talks on phone: Not on file    Gets together: Not on file    Attends religious service: Not on file    Active member of club or organization: Not on file    Attends meetings of clubs or organizations: Not on file    Relationship status: Not on file  Other Topics Concern  . Not on file  Social History Narrative   5 kids, raising grandchild   Outpatient Encounter Medications as of 03/02/2018  Medication Sig  . aspirin EC 81 MG tablet Take 81 mg by mouth daily.  . baclofen (LIORESAL) 20 MG tablet Take 20 mg by mouth 3 (three) times daily.   . Blood Glucose Monitoring Suppl (ACCU-CHEK GUIDE) w/Device KIT 1 Piece by Does not apply route as directed.  . citalopram (CELEXA) 40 MG tablet Take 40 mg by mouth every evening.   . Continuous Blood Gluc Sensor (FREESTYLE LIBRE 14 DAY SENSOR) MISC Inject 1 each into the skin every 14 (fourteen) days. Use as directed.  Marland Kitchen  COPAXONE 40 MG/ML SOSY Inject into the muscle 3 (three) times a week.  . Dulaglutide (TRULICITY) 1.5 YT/0.1SW SOPN Inject 1.5 mg into the skin once a week.  . gabapentin (NEURONTIN) 300 MG capsule Take 600 mg by mouth 3 (three) times daily.   Marland Kitchen glucose blood (ACCU-CHEK GUIDE) test strip 1 each by Other route 4 (four) times daily. Use as instructed 4 x daily. E11.65  . Insulin Pen Needle (B-D ULTRAFINE III SHORT PEN) 31G X 8 MM MISC 1 each by Does not apply route as directed.  . insulin regular human CONCENTRATED (HUMULIN R U-500 KWIKPEN) 500 UNIT/ML kwikpen Inject 100 Units into the skin 3 (three) times daily with meals.  . Lancets MISC 1 each by Does not apply route as directed.  Marland Kitchen lisinopril (PRINIVIL,ZESTRIL) 2.5 MG tablet Take 2.5 mg by mouth daily.  . metFORMIN (GLUCOPHAGE) 500 MG tablet Take 1,000 mg by mouth 2 (two) times daily.  Marland Kitchen oxybutynin (DITROPAN) 5 MG tablet Take 5 mg by mouth 2 (two) times daily.  . pravastatin (PRAVACHOL) 20 MG tablet Take 1 tablet (20 mg total) by mouth every evening.  . [DISCONTINUED] Dexamethasone (DEXPAK 6 DAY) 1.5 MG (21) TBPK Take 1.5 mg by mouth taper from 4 doses each day to 1 dose and stop for 6 days. Take the directions on the dosepak  . [DISCONTINUED] famotidine (PEPCID) 20 MG tablet Take 1 tablet (20 mg total) by mouth 2 (two) times daily.  . [DISCONTINUED] insulin regular human CONCENTRATED (HUMULIN R U-500 KWIKPEN) 500 UNIT/ML kwikpen Inject 80 Units into the skin 3 (three) times daily with meals.   No facility-administered encounter medications on file as of 03/02/2018.     ALLERGIES: Allergies  Allergen Reactions  . Peanut-Containing Drug Products Anaphylaxis and Hives    Patient states she is allergic to all nuts  . Shellfish Allergy Anaphylaxis  . Gluten Meal   . Latex Other (See Comments)    Reaction: blistering  . Prednisone Hives and Other (See Comments)    Reaction: causes psychological issues   . Nexium [Esomeprazole Magnesium] Hives   . Tape Rash    Paper tape please    VACCINATION STATUS: Immunization  History  Administered Date(s) Administered  . Influenza Split 04/17/2012  . Pneumococcal Polysaccharide-23 04/17/2012    Diabetes  She presents for her follow-up diabetic visit. She has type 2 diabetes mellitus. Onset time: She was diagnosed at approximate age of 66 years. Her disease course has been worsening. There are no hypoglycemic associated symptoms. Pertinent negatives for hypoglycemia include no confusion, headaches, pallor or seizures. Associated symptoms include blurred vision, fatigue, polydipsia and polyuria. Pertinent negatives for diabetes include no chest pain and no polyphagia. There are no hypoglycemic complications. Symptoms are worsening. There are no diabetic complications. Risk factors for coronary artery disease include diabetes mellitus, hypertension, obesity, sedentary lifestyle and dyslipidemia. Current diabetic treatment includes insulin injections. She is compliant with treatment most of the time. Her weight is increasing steadily. She is following a generally healthy (she admits to consumption of large quantities of soda.) diet. When asked about meal planning, she reported none. She has not had a previous visit with a dietitian. She never participates in exercise. Her home blood glucose trend is increasing steadily. Her breakfast blood glucose range is generally >200 mg/dl. Her lunch blood glucose range is generally >200 mg/dl. Her dinner blood glucose range is generally >200 mg/dl. Her bedtime blood glucose range is generally >200 mg/dl. Her overall blood glucose range is >200 mg/dl. (She returns with persistent and severe hyperglycemia, her recent labs showed  A1c is >14% increasing from 12.9%.  He failed to call clinic for extremely high blood glucose readings. ) An ACE inhibitor/angiotensin II receptor blocker is being taken.  Hypertension  This is a chronic problem. The current episode started more  than 1 year ago. The problem is controlled. Associated symptoms include blurred vision. Pertinent negatives include no chest pain, headaches, palpitations or shortness of breath. Risk factors for coronary artery disease include obesity, sedentary lifestyle, diabetes mellitus and family history. Past treatments include ACE inhibitors.  Hyperlipidemia  This is a chronic problem. The current episode started more than 1 year ago. The problem is uncontrolled. Exacerbating diseases include diabetes and obesity. Pertinent negatives include no chest pain, myalgias or shortness of breath. Current antihyperlipidemic treatment includes statins. Risk factors for coronary artery disease include dyslipidemia, diabetes mellitus, hypertension, obesity and a sedentary lifestyle.    Review of Systems  Constitutional: Positive for fatigue. Negative for chills, fever and unexpected weight change.  HENT: Negative for trouble swallowing and voice change.   Eyes: Positive for blurred vision. Negative for visual disturbance.  Respiratory: Negative for cough, shortness of breath and wheezing.   Cardiovascular: Negative for chest pain, palpitations and leg swelling.  Gastrointestinal: Negative for diarrhea, nausea and vomiting.  Endocrine: Positive for polydipsia and polyuria. Negative for cold intolerance, heat intolerance and polyphagia.  Musculoskeletal: Negative for arthralgias and myalgias.  Skin: Negative for color change, pallor, rash and wound.  Neurological: Negative for seizures and headaches.  Psychiatric/Behavioral: Negative for confusion and suicidal ideas.    Objective:    BP 121/70   Pulse 88   Ht _0  (1.651 m)   Wt 203 lb (92.1 kg)   BMI 33.78 kg/m   Wt Readings from Last 3 Encounters:  03/02/18 203 lb (92.1 kg)  02/23/18 196 lb 13.9 oz (89.3 kg)  02/03/18 197 lb (89.4 kg)     Physical Exam  Constitutional: She is oriented to person, place, and time. She appears well-developed.  Obese.    HENT:  Head: Normocephalic and atraumatic.  Eyes: EOM are normal.  Neck: Normal range of motion.  Neck supple. No tracheal deviation present. No thyromegaly present.  Cardiovascular: Normal rate and regular rhythm.  Pulmonary/Chest: Effort normal. No respiratory distress.  Abdominal: Soft. There is no tenderness. There is no guarding.  Musculoskeletal: Normal range of motion. She exhibits no edema.  Neurological: She is alert and oriented to person, place, and time. No cranial nerve deficit. Coordination normal.  Skin: Skin is warm and dry. No rash noted. No erythema. No pallor.  Diffuse tattoos  Psychiatric: She has a normal mood and affect.    CMP     Component Value Date/Time   NA 135 12/14/2017 0922   K 4.1 12/14/2017 0922   CL 99 12/14/2017 0922   CO2 27 12/14/2017 0922   GLUCOSE 334 (H) 12/14/2017 0922   BUN 16 12/14/2017 0922   CREATININE 0.66 12/14/2017 0922   CALCIUM 9.1 12/14/2017 0922   PROT 7.2 12/14/2017 0922   ALBUMIN 3.6 06/26/2014 1702   AST 43 (H) 12/14/2017 0922   ALT 46 (H) 12/14/2017 0922   ALKPHOS 128 (H) 06/26/2014 1702   BILITOT 0.4 12/14/2017 0922   GFRNONAA 102 12/14/2017 0922   GFRAA 118 12/14/2017 0922     Diabetic Labs (most recent): Lab Results  Component Value Date   HGBA1C >14.0 (H) 12/14/2017   HGBA1C 12.9 (H) 08/19/2017   HGBA1C 12.0 (H) 04/28/2017     Lipid Panel ( most recent) Lipid Panel     Component Value Date/Time   CHOL 200 (H) 04/28/2017 1101   TRIG 137 04/28/2017 1101   HDL 59 04/28/2017 1101   CHOLHDL 3.4 04/28/2017 1101   VLDL 28 04/17/2012 0644   LDLCALC 116 (H) 04/28/2017 1101      Lab Results  Component Value Date   TSH 0.31 (L) 04/28/2017   TSH 0.667 04/16/2012   FREET4 1.1 04/28/2017       Assessment & Plan:   1. Uncontrolled type 2 diabetes mellitus with hyperglycemia ([place])  - [name] has currently uncontrolled symptomatic type 2 DM since  52 years of age. -This patient continues to struggle to  control her diabetes to target, despite relatively large dose of insulin U500.   -She is in slight improvement in her glycemic profile on insulin U500.   -Her recent A1c was  > 14%, increasing from 12.9% during her last visit.   -her diabetes is complicated by obesity/ sedentary life and [name] remains at extremely high risk for more acute and chronic complications which include CAD, CVA, CKD, retinopathy, and neuropathy. These are all discussed in detail with the patient.  - I have counseled her on diet management and weight loss, by adopting a carbohydrate restricted/protein rich diet.  She admits to continued consumption of large quantities of soda.  -  Suggestion is made for her to avoid simple carbohydrates  from her diet including Cakes, Sweet Desserts / Pastries, Ice Cream, Soda (diet and regular), Sweet Tea, Candies, Chips, Cookies, Store Bought Juices, Alcohol in Excess of  1-2 drinks a day, Artificial Sweeteners, and "Sugar-free" Products. This will help patient to have stable blood glucose profile and potentially avoid unintended weight gain.  - I encouraged her to switch to  unprocessed or minimally processed complex starch and increased protein intake (animal or plant source), fruits, and vegetables.  - she is advised to stick to a routine mealtimes to eat 3 meals  a day and avoid unnecessary snacks ( to snack only to correct hypoglycemia).   - I have approached  her with the following individualized plan to manage diabetes and patient agrees:    - Based on her current presentation with severe and persistent hyperglycemia despite large dose of insulin U500, she will require significantly higher dose.    -I discussed and increased her Humulin U50 0-100  units 3 times daily AC, 20 minutes before meals, associated with strict monitoring of blood glucose 4 times a day minimum.  She wears the freestyle libre device for CGM, encouraged to use it properly.  Review of her CGM shows 84%  above target, 16% time in range. -She is advised not to use any other kind of insulin while taking insulin U500.    -Once again, she is encouraged to call clinic for blood glucose levels less than 70 or above 300 mg /dl. -She is advised to continue metformin 1000 mg p.o. twice daily, therapeutically suitable for patient . -She also advised to continue Trulicity 1.5 mg subcutaneously weekly.    Side effects and precautions discussed with her.  - Patient specific target  A1c;  LDL, HDL, Triglycerides, and  Waist Circumference were discussed in detail.  2) BP/HTN: Her blood pressure is controlled to target.     She is advised to continue her current blood pressure medications including lisinopril 2.5 mg p.o. Daily.   3) Lipids/HPL: Her last lipid panel showed improving LDL of 116, improving from 141.  She is advised to continue pravastatin 20 mg p.o. nightly.        Side effects and precautions discussed with her.  -She will be considered for high intensity statin, atorvastatin 80 mg nightly on subsequent visits.   4)  Weight/Diet: CDE Consult is pending  , exercise, and detailed carbohydrates information provided.  5) Chronic Care/Health Maintenance:  -she  is on ACEI/ARB and is encouraged to continue to follow up with Ophthalmology, Dentist,  Podiatrist at least yearly or according to recommendations, and advised to   stay away from smoking. I have recommended yearly flu vaccine and pneumonia vaccination at least every 5 years; moderate intensity exercise for up to 150 minutes weekly; and  sleep for at least 7 hours a day.  - I advised patient to maintain close follow up with [employer], MD for primary care needs.  - Time spent with the patient: 25 min, of which >50% was spent in reviewing her blood glucose logs , discussing her hypo- and hyper-glycemic episodes, reviewing her current and  previous labs and insulin doses and developing a plan to avoid hypo- and hyper-glycemia. Please  refer to Patient Instructions for Blood Glucose Monitoring and Insulin/Medications Dosing Guide"  in media tab for additional information. Mekiah Kirt Boys participated in the discussions, expressed understanding, and voiced agreement with the above plans.  All questions were answered to her satisfaction. she is encouraged to contact clinic should she have any questions or concerns prior to her return visit.   Follow up plan: - Return in about 5 weeks (around 04/06/2018).  Glade Lloyd, MD [facility] [address] [place], [place] [address] Phone: [phone]  Fax: [phone]    03/02/2018, 4:47 PM  This note was partially dictated with voice recognition software. Similar sounding words can be transcribed inadequately or may not  be corrected upon review.

## 2018-03-28 ENCOUNTER — Other Ambulatory Visit: Payer: Self-pay | Admitting: "Endocrinology

## 2018-04-02 LAB — COMPLETE METABOLIC PANEL WITH GFR
AG Ratio: 1.1 (calc) (ref 1.0–2.5)
ALBUMIN MSPROF: 3.8 g/dL (ref 3.6–5.1)
ALT: 37 U/L — AB (ref 6–29)
AST: 39 U/L — ABNORMAL HIGH (ref 10–35)
Alkaline phosphatase (APISO): 116 U/L (ref 33–130)
BUN: 12 mg/dL (ref 7–25)
CALCIUM: 8.8 mg/dL (ref 8.6–10.4)
CO2: 27 mmol/L (ref 20–32)
CREATININE: 0.66 mg/dL (ref 0.50–1.05)
Chloride: 96 mmol/L — ABNORMAL LOW (ref 98–110)
GFR, EST AFRICAN AMERICAN: 118 mL/min/{1.73_m2} (ref >=60)
GFR, EST NON AFRICAN AMERICAN: 102 mL/min/{1.73_m2} (ref >=60)
GLUCOSE: 430 mg/dL — AB (ref 65–99)
Globulin: 3.5 g/dL (calc) (ref 1.9–3.7)
Potassium: 4 mmol/L (ref 3.5–5.3)
Sodium: 132 mmol/L — ABNORMAL LOW (ref 135–146)
TOTAL PROTEIN: 7.3 g/dL (ref 6.1–8.1)
Total Bilirubin: 0.5 mg/dL (ref 0.2–1.2)

## 2018-04-02 LAB — HEMOGLOBIN A1C
EAG (MMOL/L): 19.2 (calc)
Hgb A1c MFr Bld: 13.7 % of total Hgb — ABNORMAL HIGH (ref <5.7)
MEAN PLASMA GLUCOSE: 346 (calc)

## 2018-04-07 ENCOUNTER — Encounter: Payer: Self-pay | Admitting: "Endocrinology

## 2018-04-07 ENCOUNTER — Ambulatory Visit (INDEPENDENT_AMBULATORY_CARE_PROVIDER_SITE_OTHER): Payer: BLUE CROSS/BLUE SHIELD | Admitting: "Endocrinology

## 2018-04-07 VITALS — BP 116/78 | HR 87 | Ht 65.0 in | Wt 198.0 lb

## 2018-04-07 DIAGNOSIS — Z6835 Body mass index (BMI) 35.0-35.9, adult: Secondary | ICD-10-CM

## 2018-04-07 DIAGNOSIS — I10 Essential (primary) hypertension: Secondary | ICD-10-CM

## 2018-04-07 DIAGNOSIS — E1165 Type 2 diabetes mellitus with hyperglycemia: Secondary | ICD-10-CM | POA: Diagnosis not present

## 2018-04-07 DIAGNOSIS — E782 Mixed hyperlipidemia: Secondary | ICD-10-CM | POA: Diagnosis not present

## 2018-04-07 MED ORDER — INSULIN REGULAR HUMAN (CONC) 500 UNIT/ML ~~LOC~~ SOPN: 120.0000 [IU] | PEN_INJECTOR | Freq: Three times a day (TID) | SUBCUTANEOUS | 2 refills | Status: DC

## 2018-04-07 NOTE — Patient Instructions (Signed)

## 2018-04-07 NOTE — Progress Notes (Signed)
Endocrinology Follow Up Note                                                                    04/07/2018, 10:06 AM   Subjective:    Patient ID: [name], female    DOB: Jan 08, 1966.  [name] is being seen in  follow up for management of currently uncontrolled symptomatic 2 diabetes, hyperlipidemia, hypertension. PMD:   [employer], MD.   Past Medical History:  Diagnosis Date  . Asthma   . Chronic abdominal pain   . Chronic neck pain    C4-6 fusion  . CONSTIPATION 05/16/2009   Qualifier: Diagnosis of  By: Ronnald Ramp FNP-BC, Kandice L   . Diabetes mellitus without complication ([place])   . Dysphagia 02/23/2012  . Fatty liver   . GERD (gastroesophageal reflux disease)   . Helicobacter pylori gastritis 2011  . Hyperlipidemia   . Hypertension   . LIVER FUNCTION TESTS, ABNORMAL, HX OF 05/14/2009   Qualifier: Diagnosis of  By: Stephan Minister    . Migraine headache   . MIGRAINE, COMMON 05/14/2009   Qualifier: Diagnosis of  By: Stephan Minister    . MS (multiple sclerosis) ([place])   . Sleep apnea    Past Surgical History:  Procedure Laterality Date  . ABDOMINAL HYSTERECTOMY    . APPENDECTOMY    . BACK SURGERY  03/07/2012   Nerve Stimulator  . CHOLECYSTECTOMY    . COLONOSCOPY  07/09/2009   RMR; normal rectum aside from anal canal hemorrhoids/scattered left-sided diverticula  . ESOPHAGOGASTRODUODENOSCOPY  05/22/2009   RMR; normal /small HH  . ESOPHAGOGASTRODUODENOSCOPY  2007   RMR: non-critical Schatzki's rin, non-maniuplated  . ESOPHAGOGASTRODUODENOSCOPY N/A 10/24/2013   Procedure: ESOPHAGOGASTRODUODENOSCOPY (EGD);  Surgeon: Daneil Dolin, MD;  Location: [facility];  Service: Endoscopy;  Laterality: N/A;  9:45  . ESOPHAGOGASTRODUODENOSCOPY (EGD) WITH ESOPHAGEAL DILATION  03/17/2012   UXY:BFXOVANV appearing esophageal mucosa of uncertain significance. Status post Venia Minks dilation followed by esophageal bx  . MALONEY  DILATION N/A 10/24/2013   Procedure: Venia Minks DILATION;  Surgeon: Daneil Dolin, MD;  Location: [facility];  Service: Endoscopy;  Laterality: N/A;  . MANDIBLE SURGERY    . NECK SURGERY     X2, last time 2013   Social History   Socioeconomic History  . Marital status: Married    Spouse name: Not on file  . Number of children: Not on file  . Years of education: Not on file  . Highest education level: Not on file  Occupational History  . Not on file  Social Needs  . Financial resource strain: Not on file  . Food insecurity:    Worry: Not on file    Inability: Not on file  . Transportation needs:    Medical: Not on file    Non-medical: Not on file  Tobacco Use  . Smoking status: Never Smoker  . Smokeless tobacco: Never Used  Substance and Sexual Activity  . Alcohol use: No  . Drug use: No  .  Sexual activity: Yes    Birth control/protection: Surgical  Lifestyle  . Physical activity:    Days per week: Not on file    Minutes per session: Not on file  . Stress: Not on file  Relationships  . Social connections:    Talks on phone: Not on file    Gets together: Not on file    Attends religious service: Not on file    Active member of club or organization: Not on file    Attends meetings of clubs or organizations: Not on file    Relationship status: Not on file  Other Topics Concern  . Not on file  Social History Narrative   5 kids, raising grandchild   Outpatient Encounter Medications as of 04/07/2018  Medication Sig  . aspirin EC 81 MG tablet Take 81 mg by mouth daily.  . baclofen (LIORESAL) 20 MG tablet Take 20 mg by mouth 3 (three) times daily.   . Blood Glucose Monitoring Suppl (ACCU-CHEK GUIDE) w/Device KIT 1 Piece by Does not apply route as directed.  . citalopram (CELEXA) 40 MG tablet Take 40 mg by mouth every evening.   . Continuous Blood Gluc Sensor (FREESTYLE LIBRE 14 DAY SENSOR) MISC APPLY 1 SENSOR EVERY 14 DAYS AS DIRECTED.  Marland Kitchen COPAXONE 40 MG/ML SOSY Inject  into the muscle 3 (three) times a week.  . Dulaglutide (TRULICITY) 1.5 VO/1.6WV SOPN Inject 1.5 mg into the skin once a week.  . gabapentin (NEURONTIN) 300 MG capsule Take 600 mg by mouth 3 (three) times daily.   Marland Kitchen glucose blood (ACCU-CHEK GUIDE) test strip 1 each by Other route 4 (four) times daily. Use as instructed 4 x daily. E11.65  . Insulin Pen Needle (B-D ULTRAFINE III SHORT PEN) 31G X 8 MM MISC 1 each by Does not apply route as directed.  . insulin regular human CONCENTRATED (HUMULIN R U-500 KWIKPEN) 500 UNIT/ML kwikpen Inject 120 Units into the skin 3 (three) times daily with meals.  . Lancets MISC 1 each by Does not apply route as directed.  Marland Kitchen lisinopril (PRINIVIL,ZESTRIL) 2.5 MG tablet Take 2.5 mg by mouth daily.  . metFORMIN (GLUCOPHAGE) 500 MG tablet Take 1,000 mg by mouth 2 (two) times daily.  Marland Kitchen oxybutynin (DITROPAN) 5 MG tablet Take 5 mg by mouth 2 (two) times daily.  . pravastatin (PRAVACHOL) 20 MG tablet Take 1 tablet (20 mg total) by mouth every evening.  . [DISCONTINUED] insulin regular human CONCENTRATED (HUMULIN R U-500 KWIKPEN) 500 UNIT/ML kwikpen Inject 100 Units into the skin 3 (three) times daily with meals.   No facility-administered encounter medications on file as of 04/07/2018.     ALLERGIES: Allergies  Allergen Reactions  . Peanut-Containing Drug Products Anaphylaxis and Hives    Patient states she is allergic to all nuts  . Shellfish Allergy Anaphylaxis  . Gluten Meal   . Latex Other (See Comments)    Reaction: blistering  . Prednisone Hives and Other (See Comments)    Reaction: causes psychological issues   . Nexium [Esomeprazole Magnesium] Hives  . Tape Rash    Paper tape please    VACCINATION STATUS: Immunization History  Administered Date(s) Administered  . Influenza Split 04/17/2012  . Pneumococcal Polysaccharide-23 04/17/2012    Diabetes  She presents for her follow-up diabetic visit. She has type 2 diabetes mellitus. Onset time: She was  diagnosed at approximate age of 67 years. Her disease course has been worsening. There are no hypoglycemic associated symptoms. Pertinent negatives for hypoglycemia include  no confusion, headaches, pallor or seizures. Associated symptoms include blurred vision, fatigue, polydipsia and polyuria. Pertinent negatives for diabetes include no chest pain and no polyphagia. There are no hypoglycemic complications. Symptoms are worsening. There are no diabetic complications. Risk factors for coronary artery disease include diabetes mellitus, hypertension, obesity, sedentary lifestyle and dyslipidemia. Current diabetic treatment includes insulin injections. She is compliant with treatment most of the time. Her weight is fluctuating minimally. She is following a generally healthy (she admits to consumption of large quantities of soda.) diet. When asked about meal planning, she reported none. She has not had a previous visit with a dietitian. She never participates in exercise. Her home blood glucose trend is increasing steadily. Her breakfast blood glucose range is generally >200 mg/dl. Her lunch blood glucose range is generally >200 mg/dl. Her dinner blood glucose range is generally >200 mg/dl. Her bedtime blood glucose range is generally >200 mg/dl. Her overall blood glucose range is >200 mg/dl. (She returns with A1c is still very high at 13.7%, however probably decreasing because her last visit A1c was  >14%. ) An ACE inhibitor/angiotensin II receptor blocker is being taken.  Hypertension  This is a chronic problem. The current episode started more than 1 year ago. The problem is controlled. Associated symptoms include blurred vision. Pertinent negatives include no chest pain, headaches, palpitations or shortness of breath. Risk factors for coronary artery disease include obesity, sedentary lifestyle, diabetes mellitus and family history. Past treatments include ACE inhibitors.  Hyperlipidemia  This is a chronic  problem. The current episode started more than 1 year ago. The problem is uncontrolled. Exacerbating diseases include diabetes and obesity. Pertinent negatives include no chest pain, myalgias or shortness of breath. Current antihyperlipidemic treatment includes statins. Risk factors for coronary artery disease include dyslipidemia, diabetes mellitus, hypertension, obesity and a sedentary lifestyle.    Review of Systems  Constitutional: Positive for fatigue. Negative for chills, fever and unexpected weight change.  HENT: Negative for trouble swallowing and voice change.   Eyes: Positive for blurred vision. Negative for visual disturbance.  Respiratory: Negative for cough, shortness of breath and wheezing.   Cardiovascular: Negative for chest pain, palpitations and leg swelling.  Gastrointestinal: Negative for diarrhea, nausea and vomiting.  Endocrine: Positive for polydipsia and polyuria. Negative for cold intolerance, heat intolerance and polyphagia.  Musculoskeletal: Negative for arthralgias and myalgias.  Skin: Negative for color change, pallor, rash and wound.  Neurological: Negative for seizures and headaches.  Psychiatric/Behavioral: Negative for confusion and suicidal ideas.    Objective:    BP 116/78   Pulse 87   Ht '5\' 5"'  (1.651 m)   Wt 198 lb (89.8 kg)   BMI 32.95 kg/m   Wt Readings from Last 3 Encounters:  04/07/18 198 lb (89.8 kg)  03/02/18 203 lb (92.1 kg)  02/23/18 196 lb 13.9 oz (89.3 kg)     Physical Exam  Constitutional: She is oriented to person, place, and time. She appears well-developed.  Obese.   HENT:  Head: Normocephalic and atraumatic.  Eyes: EOM are normal.  Neck: Normal range of motion. Neck supple. No tracheal deviation present. No thyromegaly present.  Cardiovascular: Normal rate and regular rhythm.  Pulmonary/Chest: Effort normal. No respiratory distress.  Abdominal: Soft. There is no abdominal tenderness. There is no guarding.  Musculoskeletal:  Normal range of motion.        General: No edema.  Neurological: She is alert and oriented to person, place, and time. No cranial nerve deficit. Coordination normal.  Skin: Skin is warm and dry. No rash noted. No erythema. No pallor.  Extensive tattoos.  Psychiatric: She has a normal mood and affect.   Recent Results (from the past 2160 hour(s))  Hemoglobin A1c     Status: Abnormal   Collection Time: 04/01/18 11:42 AM  Result Value Ref Range   Hgb A1c MFr Bld 13.7 (H) <5.7 % of total Hgb    Comment: For someone without known diabetes, a hemoglobin A1c value of 6.5% or greater indicates that they may have  diabetes and this should be confirmed with a follow-up  test. . For someone with known diabetes, a value <7% indicates  that their diabetes is well controlled and a value  greater than or equal to 7% indicates suboptimal  control. A1c targets should be individualized based on  duration of diabetes, age, comorbid conditions, and  other considerations. . Currently, no consensus exists regarding use of hemoglobin A1c for diagnosis of diabetes for children. .    Mean Plasma Glucose 346 (calc)   eAG (mmol/L) 19.2 (calc)  COMPLETE METABOLIC PANEL WITH GFR     Status: Abnormal   Collection Time: 04/01/18 11:42 AM  Result Value Ref Range   Glucose, Bld 430 (H) 65 - 99 mg/dL    Comment: Verified by repeat analysis. Marland Kitchen .            Fasting reference interval . For someone without known diabetes, a glucose value >125 mg/dL indicates that they may have diabetes and this should be confirmed with a follow-up test. .    BUN 12 7 - 25 mg/dL   Creat 0.66 0.50 - 1.05 mg/dL    Comment: For patients >34 years of age, the reference limit for Creatinine is approximately 13% higher for people identified as African-American. .    GFR, Est Non African American 102 > OR = 60 mL/min/1.79m   GFR, Est African American 118 > OR = 60 mL/min/1.742m  BUN/Creatinine Ratio NOT APPLICABLE 6 - 22  (calc)   Sodium 132 (L) 135 - 146 mmol/L   Potassium 4.0 3.5 - 5.3 mmol/L   Chloride 96 (L) 98 - 110 mmol/L   CO2 27 20 - 32 mmol/L   Calcium 8.8 8.6 - 10.4 mg/dL   Total Protein 7.3 6.1 - 8.1 g/dL   Albumin 3.8 3.6 - 5.1 g/dL   Globulin 3.5 1.9 - 3.7 g/dL (calc)   AG Ratio 1.1 1.0 - 2.5 (calc)   Total Bilirubin 0.5 0.2 - 1.2 mg/dL   Alkaline phosphatase (APISO) 116 33 - 130 U/L   AST 39 (H) 10 - 35 U/L   ALT 37 (H) 6 - 29 U/L    Diabetic Labs (most recent): Lab Results  Component Value Date   HGBA1C 13.7 (H) 04/01/2018   HGBA1C >14.0 (H) 12/14/2017   HGBA1C 12.9 (H) 08/19/2017     Lipid Panel ( most recent) Lipid Panel     Component Value Date/Time   CHOL 200 (H) 04/28/2017 1101   TRIG 137 04/28/2017 1101   HDL 59 04/28/2017 1101   CHOLHDL 3.4 04/28/2017 1101   VLDL 28 04/17/2012 0644   LDLCALC 116 (H) 04/28/2017 1101      Lab Results  Component Value Date   TSH 0.31 (L) 04/28/2017   TSH 0.667 04/16/2012   FREET4 1.1 04/28/2017       Assessment & Plan:   1. Uncontrolled type 2 diabetes mellitus with hyperglycemia ([facility] [facility] [place] [facility]  currently uncontrolled symptomatic type 2 DM since  53 years of age. -This patient continues to struggle to control her diabetes to target, despite relatively large dose of insulin U500.   -She comes in with slight improvement in her glycemic profile  on insulin U500.   -Her recent A1c is 13.7%, improving from   > 14%.  -her diabetes is complicated by obesity/ sedentary life and [name] remains at extremely high risk for more acute and chronic complications which include CAD, CVA, CKD, retinopathy, and neuropathy. These are all discussed in detail with the patient.  - I have counseled her on diet management and weight loss, by adopting a carbohydrate restricted/protein rich diet.  She admits to continued consumption of large quantities of soda.  -  Suggestion is made for her to avoid simple carbohydrates  from her diet  including Cakes, Sweet Desserts / Pastries, Ice Cream, Soda (diet and regular), Sweet Tea, Candies, Chips, Cookies, Store Bought Juices, Alcohol in Excess of  1-2 drinks a day, Artificial Sweeteners, and "Sugar-free" Products. This will help patient to have stable blood glucose profile and potentially avoid unintended weight gain.   - I encouraged her to switch to  unprocessed or minimally processed complex starch and increased protein intake (animal or plant source), fruits, and vegetables.  - she is advised to stick to a routine mealtimes to eat 3 meals  a day and avoid unnecessary snacks ( to snack only to correct hypoglycemia).   - I have approached her with the following individualized plan to manage diabetes and patient agrees:    - Based on her current presentation with severe and persistent hyperglycemia despite large dose of insulin U500, she will require significantly higher dose.    -I discussed and increased her Humulin U500 to 120  units 3 times daily AC, 20 minutes before meals, associated with strict monitoring of blood glucose 4 times a day minimum.  She wears the freestyle libre device for CGM, encouraged to use it properly.  Review of her CGM shows 92% above target, 8% time in range. -She is advised not to use any other kind of insulin while taking insulin U500.    -Once again, she is encouraged to call clinic for blood glucose levels less than 70 or above 300 mg /dl. -She is advised to continue metformin 1000 mg p.o. twice daily, therapeutically suitable for patient . -She also advised to continue Trulicity 1.5 mg subcutaneously weekly.    Side effects and precautions discussed with her.  - Patient specific target  A1c;  LDL, HDL, Triglycerides, and  Waist Circumference were discussed in detail.  2) BP/HTN: Her blood pressure is controlled to target.   She is advised to continue her current blood pressure medications including lisinopril 2.5 mg p.o. Daily.   3) Lipids/HPL:  Her last lipid panel showed improving LDL of 116, improving from 141.  She is advised to continue pravastatin 20 mg p.o. nightly.        Side effects and precautions discussed with her.  -She will be considered for high intensity statin, atorvastatin 80 mg nightly on subsequent visits.   4)  Weight/Diet: CDE Consult is pending  , exercise, and detailed carbohydrates information provided.  5) Chronic Care/Health Maintenance:  -she  is on ACEI/ARB and is encouraged to continue to follow up with Ophthalmology, Dentist,  Podiatrist at least yearly or according to recommendations, and advised to   stay away from smoking. I have recommended yearly flu vaccine  and pneumonia vaccination at least every 5 years; moderate intensity exercise for up to 150 minutes weekly; and  sleep for at least 7 hours a day.  - I advised patient to maintain close follow up with [employer], MD for primary care needs.  - Time spent with the patient: 25 min, of which >50% was spent in reviewing her blood glucose logs , discussing her hypo- and hyper-glycemic episodes, reviewing her current and  previous labs and insulin doses and developing a plan to avoid hypo- and hyper-glycemia. Please refer to Patient Instructions for Blood Glucose Monitoring and Insulin/Medications Dosing Guide"  in media tab for additional information. [name] participated in the discussions, expressed understanding, and voiced agreement with the above plans.  All questions were answered to her satisfaction. she is encouraged to contact clinic should she have any questions or concerns prior to her return visit.   Follow up plan: - Return in about 3 months (around 07/07/2018) for Meter, and Logs, Follow up with Pre-visit Labs, Meter, and Logs.  Glade Lloyd, MD [facility] [address] [place], [place] [address] Phone: [phone]  Fax: [phone]    04/07/2018, 10:06 AM  This note was  partially dictated with voice recognition software. Similar sounding words can be transcribed inadequately or may not  be corrected upon review.

## 2018-04-16 ENCOUNTER — Other Ambulatory Visit: Payer: Self-pay | Admitting: "Endocrinology

## 2018-04-26 ENCOUNTER — Other Ambulatory Visit: Payer: Self-pay | Admitting: Cardiology

## 2018-05-03 ENCOUNTER — Telehealth: Payer: Self-pay | Admitting: "Endocrinology

## 2018-05-03 MED ORDER — DEXCOM G6 TRANSMITTER MISC: 1.0000 | 0 refills | Status: DC | PRN

## 2018-05-03 MED ORDER — DEXCOM G6 SENSOR MISC: 1.0000 | 2 refills | Status: DC

## 2018-05-03 NOTE — Telephone Encounter (Signed)
Rx sent 

## 2018-05-05 ENCOUNTER — Other Ambulatory Visit: Payer: Self-pay

## 2018-05-05 MED ORDER — DEXCOM G6 RECEIVER DEVI: 1.0000 | 0 refills | Status: DC

## 2018-05-17 ENCOUNTER — Encounter: Payer: Self-pay | Admitting: Internal Medicine

## 2018-07-06 ENCOUNTER — Other Ambulatory Visit: Payer: Self-pay

## 2018-07-06 ENCOUNTER — Encounter: Payer: Self-pay | Admitting: "Endocrinology

## 2018-07-06 ENCOUNTER — Ambulatory Visit (INDEPENDENT_AMBULATORY_CARE_PROVIDER_SITE_OTHER): Payer: BLUE CROSS/BLUE SHIELD | Admitting: "Endocrinology

## 2018-07-06 DIAGNOSIS — E782 Mixed hyperlipidemia: Secondary | ICD-10-CM

## 2018-07-06 DIAGNOSIS — Z794 Long term (current) use of insulin: Secondary | ICD-10-CM | POA: Diagnosis not present

## 2018-07-06 DIAGNOSIS — E1165 Type 2 diabetes mellitus with hyperglycemia: Secondary | ICD-10-CM

## 2018-07-06 LAB — COMPLETE METABOLIC PANEL WITH GFR
AG RATIO: 0.9 (calc) — AB (ref 1.0–2.5)
ALT: 50 U/L — ABNORMAL HIGH (ref 6–29)
AST: 41 U/L — AB (ref 10–35)
Albumin: 3.5 g/dL — ABNORMAL LOW (ref 3.6–5.1)
Alkaline phosphatase (APISO): 103 U/L (ref 37–153)
BILIRUBIN TOTAL: 0.4 mg/dL (ref 0.2–1.2)
BUN: 10 mg/dL (ref 7–25)
CALCIUM: 8.7 mg/dL (ref 8.6–10.4)
CO2: 30 mmol/L (ref 20–32)
Chloride: 99 mmol/L (ref 98–110)
Creat: 0.59 mg/dL (ref 0.50–1.05)
GFR, EST AFRICAN AMERICAN: 122 mL/min/{1.73_m2} (ref >=60)
GFR, EST NON AFRICAN AMERICAN: 105 mL/min/{1.73_m2} (ref >=60)
GLOBULIN: 3.8 g/dL — AB (ref 1.9–3.7)
Glucose, Bld: 335 mg/dL — ABNORMAL HIGH (ref 65–99)
POTASSIUM: 4.1 mmol/L (ref 3.5–5.3)
SODIUM: 136 mmol/L (ref 135–146)
Total Protein: 7.3 g/dL (ref 6.1–8.1)

## 2018-07-06 LAB — HEMOGLOBIN A1C
EAG (MMOL/L): 19.5 (calc)
Hgb A1c MFr Bld: 13.9 % of total Hgb — ABNORMAL HIGH (ref <5.7)
MEAN PLASMA GLUCOSE: 352 (calc)

## 2018-07-06 MED ORDER — INSULIN REGULAR HUMAN (CONC) 500 UNIT/ML ~~LOC~~ SOPN: 130.0000 [IU] | PEN_INJECTOR | Freq: Three times a day (TID) | SUBCUTANEOUS | 2 refills | Status: DC

## 2018-07-06 NOTE — Progress Notes (Signed)
07/06/2018, 12:15 PM                                                     Endocrinology Telephone Visit Follow up Note -During COVID -19 Pandemic   Subjective:    Patient ID: [name], female    DOB: 07-01-1965.  Soyla C Champa is engaged in telephone visit for the management of currently uncontrolled symptomatic 2 diabetes, hyperlipidemia, hypertension. PMD:   Elfredia Nevins, MD.   Past Medical History:  Diagnosis Date  . Asthma   . Chronic abdominal pain   . Chronic neck pain    C4-6 fusion  . CONSTIPATION 05/16/2009   Qualifier: Diagnosis of  By: Yetta Barre FNP-BC, Kandice L   . Diabetes mellitus without complication (HCC)   . Dysphagia 02/23/2012  . Fatty liver   . GERD (gastroesophageal reflux disease)   . Helicobacter pylori gastritis 2011  . Hyperlipidemia   . Hypertension   . LIVER FUNCTION TESTS, ABNORMAL, HX OF 05/14/2009   Qualifier: Diagnosis of  By: Ricard Dillon    . Migraine headache   . MIGRAINE, COMMON 05/14/2009   Qualifier: Diagnosis of  By: Ricard Dillon    . MS (multiple sclerosis) (HCC)   . Sleep apnea    Past Surgical History:  Procedure Laterality Date  . ABDOMINAL HYSTERECTOMY    . APPENDECTOMY    . BACK SURGERY  03/07/2012   Nerve Stimulator  . CHOLECYSTECTOMY    . COLONOSCOPY  07/09/2009   RMR; normal rectum aside from anal canal hemorrhoids/scattered left-sided diverticula  . ESOPHAGOGASTRODUODENOSCOPY  05/22/2009   RMR; normal /small HH  . ESOPHAGOGASTRODUODENOSCOPY  2007   RMR: non-critical Schatzki's rin, non-maniuplated  . ESOPHAGOGASTRODUODENOSCOPY N/A 10/24/2013   Procedure: ESOPHAGOGASTRODUODENOSCOPY (EGD);  Surgeon: Corbin Ade, MD;  Location: [facility];  Service: Endoscopy;  Laterality: N/A;  9:45  . ESOPHAGOGASTRODUODENOSCOPY (EGD) WITH ESOPHAGEAL DILATION  03/17/2012   NGE:[id number] appearing esophageal  mucosa of uncertain significance. Status post [name] dilation followed by esophageal bx  . MALONEY DILATION N/A 10/24/2013   Procedure: [name] DILATION;  Surgeon: Corbin Ade, MD;  Location: [facility];  Service: Endoscopy;  Laterality: N/A;  . MANDIBLE SURGERY    . NECK SURGERY     X2, last time 2013   Social History   Socioeconomic History  . Marital status: Married    Spouse name: Not on file  . Number of children: Not on file  . Years of education: Not on file  . Highest education level: Not on file  Occupational History  . Not on file  Social Needs  . Financial resource strain: Not on file  . Food insecurity:    Worry: Not on file  Inability: Not on file  . Transportation needs:    Medical: Not on file    Non-medical: Not on file  Tobacco Use  . Smoking status: Never Smoker  . Smokeless tobacco: Never Used  Substance and Sexual Activity  . Alcohol use: No  . Drug use: No  . Sexual activity: Yes    Birth control/protection: Surgical  Lifestyle  . Physical activity:    Days per week: Not on file    Minutes per session: Not on file  . Stress: Not on file  Relationships  . Social connections:    Talks on phone: Not on file    Gets together: Not on file    Attends religious service: Not on file    Active member of club or organization: Not on file    Attends meetings of clubs or organizations: Not on file    Relationship status: Not on file  Other Topics Concern  . Not on file  Social History Narrative   5 kids, raising grandchild   Outpatient Encounter Medications as of 07/06/2018  Medication Sig  . aspirin EC 81 MG tablet Take 81 mg by mouth daily.  . B-D ULTRAFINE III SHORT PEN 31G X 8 MM MISC USE AS DIRECTED ONCE DAILY.  . baclofen (LIORESAL) 20 MG tablet Take 20 mg by mouth 3 (three) times daily.   . citalopram (CELEXA) 40 MG tablet Take 40 mg by mouth every evening.   . Continuous Blood Gluc Receiver (DEXCOM G6 RECEIVER) DEVI 1 each by Does not  apply route as directed.  . Continuous Blood Gluc Sensor (DEXCOM G6 SENSOR) MISC 1 each by Does not apply route once a week.  . Continuous Blood Gluc Transmit (DEXCOM G6 TRANSMITTER) MISC 1 each by Does not apply route as needed.  Marland Kitchen. COPAXONE 40 MG/ML SOSY Inject into the muscle 3 (three) times a week.  . Dulaglutide (TRULICITY) 1.5 MG/0.5ML SOPN Inject 1.5 mg into the skin once a week.  . gabapentin (NEURONTIN) 300 MG capsule Take 600 mg by mouth 3 (three) times daily.   Marland Kitchen. glucose blood (ACCU-CHEK GUIDE) test strip 1 each by Other route 4 (four) times daily. Use as instructed 4 x daily. E11.65  . insulin regular human CONCENTRATED (HUMULIN R U-500 KWIKPEN) 500 UNIT/ML kwikpen Inject 130 Units into the skin 3 (three) times daily with meals.  . Lancets MISC 1 each by Does not apply route as directed.  Marland Kitchen. lisinopril (PRINIVIL,ZESTRIL) 2.5 MG tablet Take 2.5 mg by mouth daily.  . metFORMIN (GLUCOPHAGE) 500 MG tablet Take 1,000 mg by mouth 2 (two) times daily.  Marland Kitchen. oxybutynin (DITROPAN) 5 MG tablet Take 5 mg by mouth 2 (two) times daily.  . pravastatin (PRAVACHOL) 20 MG tablet TAKE 1 TABLET BY MOUTH EVERY EVENING.  . [DISCONTINUED] insulin regular human CONCENTRATED (HUMULIN R U-500 KWIKPEN) 500 UNIT/ML kwikpen Inject 120 Units into the skin 3 (three) times daily with meals.   No facility-administered encounter medications on file as of 07/06/2018.     ALLERGIES: Allergies  Allergen Reactions  . Peanut-Containing Drug Products Anaphylaxis and Hives    Patient states she is allergic to all nuts  . Shellfish Allergy Anaphylaxis  . Gluten Meal   . Latex Other (See Comments)    Reaction: blistering  . Prednisone Hives and Other (See Comments)    Reaction: causes psychological issues   . Nexium [Esomeprazole Magnesium] Hives  . Tape Rash    Paper tape please    VACCINATION STATUS:  Immunization History  Administered Date(s) Administered  . Influenza Split 04/17/2012  . Pneumococcal  Polysaccharide-23 04/17/2012    Diabetes  She presents for her follow-up diabetic visit. She has type 2 diabetes mellitus. Onset time: She was diagnosed at approximate age of 48 years. Her disease course has been worsening. There are no hypoglycemic associated symptoms. Pertinent negatives for hypoglycemia include no confusion, pallor or seizures. Associated symptoms include fatigue, polydipsia and polyuria. Pertinent negatives for diabetes include no polyphagia. There are no hypoglycemic complications. Symptoms are worsening. There are no diabetic complications. Risk factors for coronary artery disease include diabetes mellitus, hypertension, obesity, sedentary lifestyle and dyslipidemia. Current diabetic treatment includes insulin injections. She is compliant with treatment most of the time. Her weight is fluctuating minimally. She is following a generally healthy (she admits to consumption of large quantities of soda.) diet. When asked about meal planning, she reported none. She has not had a previous visit with a dietitian. She never participates in exercise. Her home blood glucose trend is increasing steadily. Her breakfast blood glucose range is generally >200 mg/dl. Her lunch blood glucose range is generally >200 mg/dl. Her dinner blood glucose range is generally >200 mg/dl. Her bedtime blood glucose range is generally >200 mg/dl. Her overall blood glucose range is >200 mg/dl. ( ) An ACE inhibitor/angiotensin II receptor blocker is being taken.  Hyperlipidemia  This is a chronic problem. The current episode started more than 1 year ago. The problem is uncontrolled. Exacerbating diseases include diabetes and obesity. Pertinent negatives include no myalgias. Current antihyperlipidemic treatment includes statins. Risk factors for coronary artery disease include dyslipidemia, diabetes mellitus, hypertension, obesity and a sedentary lifestyle.    Objective:    There were no vitals taken for this visit.   Wt Readings from Last 3 Encounters:  04/07/18 198 lb (89.8 kg)  03/02/18 203 lb (92.1 kg)  02/23/18 196 lb 13.9 oz (89.3 kg)      Recent Results (from the past 2160 hour(s))  Hemoglobin A1c     Status: Abnormal   Collection Time: 07/05/18 10:27 AM  Result Value Ref Range   Hgb A1c MFr Bld 13.9 (H) <5.7 % of total Hgb    Comment: For someone without known diabetes, a hemoglobin A1c value of 6.5% or greater indicates that they may have  diabetes and this should be confirmed with a follow-up  test. . For someone with known diabetes, a value <7% indicates  that their diabetes is well controlled and a value  greater than or equal to 7% indicates suboptimal  control. A1c targets should be individualized based on  duration of diabetes, age, comorbid conditions, and  other considerations. . Currently, no consensus exists regarding use of hemoglobin A1c for diagnosis of diabetes for children. .    Mean Plasma Glucose 352 (calc)   eAG (mmol/L) 19.5 (calc)  COMPLETE METABOLIC PANEL WITH GFR     Status: Abnormal   Collection Time: 07/05/18 10:27 AM  Result Value Ref Range   Glucose, Bld 335 (H) 65 - 99 mg/dL    Comment: .            Fasting reference interval . For someone without known diabetes, a glucose value >125 mg/dL indicates that they may have diabetes and this should be confirmed with a follow-up test. .    BUN 10 7 - 25 mg/dL   Creat 1.61 0.96 - 0.45 mg/dL    Comment: For patients >27 years of age, the reference limit for Creatinine  is approximately 13% higher for people identified as African-American. .    GFR, Est Non African American 105 > OR = 60 mL/min/1.40m2   GFR, Est African American 122 > OR = 60 mL/min/1.80m2   BUN/Creatinine Ratio NOT APPLICABLE 6 - 22 (calc)   Sodium 136 135 - 146 mmol/L   Potassium 4.1 3.5 - 5.3 mmol/L   Chloride 99 98 - 110 mmol/L   CO2 30 20 - 32 mmol/L   Calcium 8.7 8.6 - 10.4 mg/dL   Total Protein 7.3 6.1 - 8.1 g/dL    Albumin 3.5 (L) 3.6 - 5.1 g/dL   Globulin 3.8 (H) 1.9 - 3.7 g/dL (calc)   AG Ratio 0.9 (L) 1.0 - 2.5 (calc)   Total Bilirubin 0.4 0.2 - 1.2 mg/dL   Alkaline phosphatase (APISO) 103 37 - 153 U/L   AST 41 (H) 10 - 35 U/L   ALT 50 (H) 6 - 29 U/L    Diabetic Labs (most recent): Lab Results  Component Value Date   HGBA1C 13.9 (H) 07/05/2018   HGBA1C 13.7 (H) 04/01/2018   HGBA1C >14.0 (H) 12/14/2017     Lipid Panel ( most recent) Lipid Panel     Component Value Date/Time   CHOL 200 (H) 04/28/2017 1101   TRIG 137 04/28/2017 1101   HDL 59 04/28/2017 1101   CHOLHDL 3.4 04/28/2017 1101   VLDL 28 04/17/2012 0644   LDLCALC 116 (H) 04/28/2017 1101      Lab Results  Component Value Date   TSH 0.31 (L) 04/28/2017   TSH 0.667 04/16/2012   FREET4 1.1 04/28/2017       Assessment & Plan:   1. Uncontrolled type 2 diabetes mellitus with hyperglycemia (HCC)  - Theodosia C Depaz has currently uncontrolled symptomatic type 2 DM since  53 years of age. -This patient continues to struggle to control her diabetes to target, despite large dose of insulin utilizing insulin U500.   -Her previsit labs show A1c of 13.9%, unchanged from her last visit A1c of 13.7%.  -her diabetes is complicated by obesity/ sedentary life and Linlee C Mairena remains at extremely high risk for more acute and chronic complications which include CAD, CVA, CKD, retinopathy, and neuropathy. These are all discussed in detail with the patient.  - I have counseled her on diet management and weight loss, by adopting a carbohydrate restricted/protein rich diet.  She admits to continued consumption of large quantities of soda.  - Patient admits there is a room for improvement in her diet and drink choices. -  Suggestion is made for her to avoid simple carbohydrates  from her diet including Cakes, Sweet Desserts / Pastries, Ice Cream, Soda (diet and regular), Sweet Tea, Candies, Chips, Cookies, Store Bought Juices, Alcohol in Excess of   1-2 drinks a day, Artificial Sweeteners, and "Sugar-free" Products. This will help patient to have stable blood glucose profile and potentially avoid unintended weight gain.  - I encouraged her to switch to  unprocessed or minimally processed complex starch and increased protein intake (animal or plant source), fruits, and vegetables.  - she is advised to stick to a routine mealtimes to eat 3 meals  a day and avoid unnecessary snacks ( to snack only to correct hypoglycemia).   - I have approached her with the following individualized plan to manage diabetes and patient agrees:    - Based on her consistently above target glycemic profile, she will continue to require higher dose of insulin.    -  I discussed and increased her Humulin U50 0-130   units 3 times daily AC, 20 minutes before meals, associated with strict monitoring of blood glucose 4 times a day minimum.  She wears the freestyle libre device for CGM, encouraged to use it properly.  -She is advised not to use any other kind of insulin while taking insulin U500.    -Once again, she is encouraged to call clinic for blood glucose levels less than 70 or above 300 mg /dl. -She is advised to continue metformin 1000 mg p.o. twice daily, therapeutically suitable for patient . -She also advised to continue Trulicity 1.5 mg subcutaneously weekly.    Side effects and precautions discussed with her.  2) Lipids/HPL: Her last lipid panel showed improving LDL of 116, improving from 141.  She is advised to continue pravastatin 20 mg p.o. nightly.          Side effects and precautions discussed with her.  -She will be considered for high intensity statin, atorvastatin 80 mg nightly on subsequent visits.   - I advised patient to maintain close follow up with Elfredia Nevins, MD for primary care needs.  - Patient Care Time Today:  25 min, of which >50% was spent in reviewing her  current and  previous labs/studies, previous treatments, and medications  doses and developing a plan for long-term care based on the latest recommendations for standards of care.  Paizlie Rada Hay participated in the discussions, expressed understanding, and voiced agreement with the above plans.  All questions were answered to her satisfaction. she is encouraged to contact clinic should she have any questions or concerns prior to her return visit.   Follow up plan: - No follow-ups on file.  Marquis Lunch, MD [facility] [address] [place], [place] [address] Phone: [phone]  Fax: [phone]    07/06/2018, 12:15 PM  This note was partially dictated with voice recognition software. Similar sounding words can be transcribed inadequately or may not  be corrected upon review.

## 2018-07-12 ENCOUNTER — Ambulatory Visit: Payer: BLUE CROSS/BLUE SHIELD | Admitting: "Endocrinology

## 2018-07-22 ENCOUNTER — Ambulatory Visit: Payer: BLUE CROSS/BLUE SHIELD | Admitting: Gastroenterology

## 2018-08-09 ENCOUNTER — Other Ambulatory Visit: Payer: Self-pay | Admitting: "Endocrinology

## 2018-09-21 ENCOUNTER — Encounter: Payer: Self-pay | Admitting: Internal Medicine

## 2018-09-21 ENCOUNTER — Telehealth: Payer: Self-pay | Admitting: Internal Medicine

## 2018-09-21 ENCOUNTER — Ambulatory Visit: Payer: BLUE CROSS/BLUE SHIELD | Admitting: Gastroenterology

## 2018-09-21 NOTE — Telephone Encounter (Signed)
PATIENT WAS A NO SHOW AND LETTER SENT  °

## 2018-10-08 ENCOUNTER — Other Ambulatory Visit: Payer: Self-pay | Admitting: "Endocrinology

## 2018-10-28 ENCOUNTER — Telehealth: Payer: Self-pay | Admitting: Cardiology

## 2018-10-28 NOTE — Telephone Encounter (Signed)
Patient will have bp and weight for appt     Virtual Visit Pre-Appointment Phone Call  "(Name), I am calling you today to discuss your upcoming appointment. We are currently trying to limit exposure to the virus that causes COVID-19 by seeing patients at home rather than in the office."  1. "What is the BEST phone number to call the day of the visit?" - include this in appointment notes  2. Do you have or have access to (through a family member/friend) a smartphone with video capability that we can use for your visit?" a. If yes - list this number in appt notes as cell (if different from BEST phone #) and list the appointment type as a VIDEO visit in appointment notes b. If no - list the appointment type as a PHONE visit in appointment notes  3. Confirm consent - "In the setting of the current Covid19 crisis, you are scheduled for a (phone or video) visit with your provider on (date) at (time).  Just as we do with many in-office visits, in order for you to participate in this visit, we must obtain consent.  If you'd like, I can send this to your mychart (if signed up) or email for you to review.  Otherwise, I can obtain your verbal consent now.  All virtual visits are billed to your insurance company just like a normal visit would be.  By agreeing to a virtual visit, we'd like you to understand that the technology does not allow for your provider to perform an examination, and thus may limit your provider's ability to fully assess your condition. If your provider identifies any concerns that need to be evaluated in person, we will make arrangements to do so.  Finally, though the technology is pretty good, we cannot assure that it will always work on either your or our end, and in the setting of a video visit, we may have to convert it to a phone-only visit.  In either situation, we cannot ensure that we have a secure connection.  Are you willing to proceed?" STAFF: Did the patient verbally  acknowledge consent to telehealth visit? Document YES/NO here: yes  4. Advise patient to be prepared - "Two hours prior to your appointment, go ahead and check your blood pressure, pulse, oxygen saturation, and your weight (if you have the equipment to check those) and write them all down. When your visit starts, your provider will ask you for this information. If you have an Apple Watch or Kardia device, please plan to have heart rate information ready on the day of your appointment. Please have a pen and paper handy nearby the day of the visit as well."  5. Give patient instructions for MyChart download to smartphone OR [email or web address] as below if video visit (depending on what platform provider is using)  6. Inform patient they will receive a phone call 15 minutes prior to their appointment time (may be from unknown caller ID) so they should be prepared to answer    TELEPHONE CALL NOTE  [name] has been deemed a candidate for a follow-up tele-health visit to limit community exposure during the Covid-19 pandemic. I spoke with the patient via phone to ensure availability of phone/video source, confirm preferred email & phone number, and discuss instructions and expectations.  I reminded [name] to be prepared with any vital sign and/or heart rhythm information that could potentially be obtained via home monitoring, at the time of her visit. I reminded  [name] to expect a phone call prior to her visit.  Howie Ill 10/28/2018 3:36 PM   INSTRUCTIONS FOR DOWNLOADING THE MYCHART APP TO SMARTPHONE  - The patient must first make sure to have activated MyChart and know their login information - If Apple, go to [employer] and type in MyChart in the search bar and download the app. If Android, ask patient to go to [employer] and type in [place] in the search bar and download the app. The app is free but as with any other app downloads, their phone may require them to verify saved  payment information or Apple/Android password.  - The patient will need to then log into the app with their MyChart username and password, and select [facility] as their healthcare provider to link the account. When it is time for your visit, go to the MyChart app, find appointments, and click Begin Video Visit. Be sure to Select Allow for your device to access the Microphone and Camera for your visit. You will then be connected, and your provider will be with you shortly.  **If they have any issues connecting, or need assistance please contact MyChart service desk [phone])**  **If using a computer, in order to ensure the best quality for their visit they will need to use either of the following Internet Browsers: [employer], or Google Chrome**  IF USING DOXIMITY or DOXY.ME - The patient will receive a link just prior to their visit by text.     FULL LENGTH CONSENT FOR TELE-HEALTH VISIT   I hereby voluntarily request, consent and authorize [facility] and its employed or contracted physicians, physician assistants, nurse practitioners or other licensed health care professionals (the Practitioner), to provide me with telemedicine health care services (the Services") as deemed necessary by the treating Practitioner. I acknowledge and consent to receive the Services by the Practitioner via telemedicine. I understand that the telemedicine visit will involve communicating with the Practitioner through live audiovisual communication technology and the disclosure of certain medical information by electronic transmission. I acknowledge that I have been given the opportunity to request an in-person assessment or other available alternative prior to the telemedicine visit and am voluntarily participating in the telemedicine visit.  I understand that I have the right to withhold or withdraw my consent to the use of telemedicine in the course of my care at any time, without affecting  my right to future care or treatment, and that the Practitioner or I may terminate the telemedicine visit at any time. I understand that I have the right to inspect all information obtained and/or recorded in the course of the telemedicine visit and may receive copies of available information for a reasonable fee.  I understand that some of the potential risks of receiving the Services via telemedicine include:   Delay or interruption in medical evaluation due to technological equipment failure or disruption;  Information transmitted may not be sufficient (e.g. poor resolution of images) to allow for appropriate medical decision making by the Practitioner; and/or   In rare instances, security protocols could fail, causing a breach of personal health information.  Furthermore, I acknowledge that it is my responsibility to provide information about my medical history, conditions and care that is complete and accurate to the best of my ability. I acknowledge that Practitioner's advice, recommendations, and/or decision may be based on factors not within their control, such as incomplete or inaccurate data provided by me or distortions of diagnostic  images or specimens that may result from electronic transmissions. I understand that the practice of medicine is not an exact science and that Practitioner makes no warranties or guarantees regarding treatment outcomes. I acknowledge that I will receive a copy of this consent concurrently upon execution via email to the email address I last provided but may also request a printed copy by calling the office of [facility].    I understand that my insurance will be billed for this visit.   I have read or had this consent read to me.  I understand the contents of this consent, which adequately explains the benefits and risks of the Services being provided via telemedicine.   I have been provided ample opportunity to ask questions regarding this consent and the  Services and have had my questions answered to my satisfaction.  I give my informed consent for the services to be provided through the use of telemedicine in my medical care  By participating in this telemedicine visit I agree to the above.

## 2018-11-02 LAB — COMPLETE METABOLIC PANEL WITH GFR
AG Ratio: 0.9 (calc) — ABNORMAL LOW (ref 1.0–2.5)
ALT: 52 U/L — ABNORMAL HIGH (ref 6–29)
AST: 52 U/L — ABNORMAL HIGH (ref 10–35)
Albumin: 3.6 g/dL (ref 3.6–5.1)
Alkaline phosphatase (APISO): 113 U/L (ref 37–153)
BUN: 11 mg/dL (ref 7–25)
CO2: 26 mmol/L (ref 20–32)
Calcium: 8.6 mg/dL (ref 8.6–10.4)
Chloride: 95 mmol/L — ABNORMAL LOW (ref 98–110)
Creat: 0.69 mg/dL (ref 0.50–1.05)
GFR, Est African American: 115 mL/min/{1.73_m2} (ref >=60)
GFR, Est Non African American: 99 mL/min/{1.73_m2} (ref >=60)
Globulin: 3.8 g/dL (calc) — ABNORMAL HIGH (ref 1.9–3.7)
Glucose, Bld: 573 mg/dL (ref 65–139)
Potassium: 4.3 mmol/L (ref 3.5–5.3)
Sodium: 132 mmol/L — ABNORMAL LOW (ref 135–146)
Total Bilirubin: 0.4 mg/dL (ref 0.2–1.2)
Total Protein: 7.4 g/dL (ref 6.1–8.1)

## 2018-11-02 LAB — HEMOGLOBIN A1C: Hgb A1c MFr Bld: >14 % of total Hgb — ABNORMAL HIGH (ref <5.7)

## 2018-11-03 ENCOUNTER — Encounter: Payer: Self-pay | Admitting: Cardiology

## 2018-11-03 ENCOUNTER — Other Ambulatory Visit: Payer: Self-pay

## 2018-11-03 ENCOUNTER — Telehealth (INDEPENDENT_AMBULATORY_CARE_PROVIDER_SITE_OTHER): Payer: BC Managed Care – PPO | Admitting: Cardiology

## 2018-11-03 VITALS — BP 117/78 | HR 120 | Ht 65.0 in | Wt 192.0 lb

## 2018-11-03 DIAGNOSIS — R0789 Other chest pain: Secondary | ICD-10-CM | POA: Diagnosis not present

## 2018-11-03 DIAGNOSIS — R002 Palpitations: Secondary | ICD-10-CM

## 2018-11-03 NOTE — Patient Instructions (Signed)
Medication Instructions:  Your physician recommends that you continue on your current medications as directed. Please refer to the Current Medication list given to you today.   Labwork: none  Testing/Procedures: none  Follow-Up: Your physician recommends that you schedule a follow-up appointment in: tomorrow with a nurse visit to have EKG & VITALS    Any Other Special Instructions Will Be Listed Below (If Applicable).     If you need a refill on your cardiac medications before your next appointment, please call your pharmacy.

## 2018-11-03 NOTE — Progress Notes (Signed)
Virtual Visit via Telephone Note   This visit type was conducted due to national recommendations for restrictions regarding the COVID-19 Pandemic (e.g. social distancing) in an effort to limit this patient's exposure and mitigate transmission in our community.  Due to her co-morbid illnesses, this patient is at least at moderate risk for complications without adequate follow up.  This format is felt to be most appropriate for this patient at this time.  The patient did not have access to video technology/had technical difficulties with video requiring transitioning to audio format only (telephone).  All issues noted in this document were discussed and addressed.  No physical exam could be performed with this format.  Please refer to the patient's chart for her  consent to telehealth for [facility].   Date:  11/03/2018   ID:  [name], DOB 04-23-1965, MRN [id number]  Patient Location: Home Provider Location: Office  PCP:  Redmond School, MD  Cardiologist:  Carlyle Dolly, MD  Electrophysiologist:  None   Evaluation Performed:  Follow-Up Visit  Chief Complaint:  Palpitations  History of Present Illness:    [name] is a 53 y.o. female seen Odom for follow up of the following medical problem.s  1. Chest pain - seen in 2014 by Dr Johnsie Cancel. Around that time CT PE negative.  - reported history of esophageal spasms with prior dilatations  - 08/2016 exercise stress echo: no ischemia - 07/2016 echoLVEF 55-60%, no WMAs, grade I diastolic dysfunction   - no recent chest pain   2. Palpitations - has had feeling of heart racing for a few weeks. Episodes few times a week.  - lasts a few minutes, can last longer - +SOB, no lightheadness or dizzines - no significant caffeine, rare wine.  - will feel heart rate elevated at times, checks and will 120s-130s.   3. Hyperlipidemia - 06/2016 TC 232 TG 181 HDL 55 LDL 141 - has some elevation liver enzymes. Told has a fatty liver.  - we started pravastatin 20mg  daily. Mildly elevated LFTs stable from baseline.     The patient does not have symptoms concerning for COVID-19 infection (fever, chills, cough, or new shortness of breath).    Past Medical History:  Diagnosis Date  . Asthma   . Chronic abdominal pain   . Chronic neck pain    C4-6 fusion  . CONSTIPATION 05/16/2009   Qualifier: Diagnosis of  By: Ronnald Ramp FNP-BC, Kandice L   . Diabetes mellitus without complication ([place])   . Dysphagia 02/23/2012  . Fatty liver   . GERD (gastroesophageal reflux disease)   . Helicobacter pylori gastritis 2011  . Hyperlipidemia   . Hypertension   . LIVER FUNCTION TESTS, ABNORMAL, HX OF 05/14/2009   Qualifier: Diagnosis of  By: Stephan Minister    . Migraine headache   . MIGRAINE, COMMON 05/14/2009   Qualifier: Diagnosis of  By: Stephan Minister    . MS (multiple sclerosis) ([place])   . Sleep apnea    Past Surgical History:  Procedure Laterality Date  . ABDOMINAL HYSTERECTOMY    . APPENDECTOMY    . BACK SURGERY  03/07/2012   Nerve Stimulator  . CHOLECYSTECTOMY    . COLONOSCOPY  07/09/2009   RMR; normal rectum aside from anal canal hemorrhoids/scattered left-sided diverticula  . ESOPHAGOGASTRODUODENOSCOPY  05/22/2009   RMR; normal /small HH  . ESOPHAGOGASTRODUODENOSCOPY  2007   RMR: non-critical Schatzki's rin, non-maniuplated  . ESOPHAGOGASTRODUODENOSCOPY N/A 10/24/2013   Procedure: ESOPHAGOGASTRODUODENOSCOPY (EGD);  Surgeon: Herbie Baltimore  Sonnie AlamoM Rourk, MD;  [facility];  Service: Endoscopy;  Laterality: N/A;  9:45  . ESOPHAGOGASTRODUODENOSCOPY (EGD) WITH ESOPHAGEAL DILATION  03/17/2012   GNF:AOZHYQMVRMR:Abnormal appearing esophageal mucosa of uncertain significance. Status post Elease HashimotoMaloney dilation followed by esophageal bx  . MALONEY DILATION N/A 10/24/2013   Procedure: Elease HashimotoMALONEY DILATION;  Surgeon: Corbin Adeobert M Rourk, MD;  Location: [facility];  Service: Endoscopy;  Laterality: N/A;  . MANDIBLE SURGERY    . NECK SURGERY     X2, last  time 2013     Current Meds  Medication Sig  . aspirin EC 81 MG tablet Take 81 mg by mouth daily.  . B-D ULTRAFINE III SHORT PEN 31G X 8 MM MISC USE AS DIRECTED ONCE DAILY.  . baclofen (LIORESAL) 20 MG tablet Take 20 mg by mouth 3 (three) times daily.   . citalopram (CELEXA) 40 MG tablet Take 40 mg by mouth every evening.   . Continuous Blood Gluc Receiver (DEXCOM G6 RECEIVER) DEVI 1 each by Does not apply route as directed.  . Continuous Blood Gluc Sensor (DEXCOM G6 SENSOR) MISC APPLY 1 SENSOR TO SKIN ONCE A WEEK TO CHECK BLOOD SUGAR  . Continuous Blood Gluc Transmit (DEXCOM G6 TRANSMITTER) MISC AS DIRECTED TO CHECK BLOOD SUGAR  . Dulaglutide (TRULICITY) 1.5 MG/0.5ML SOPN Inject 1.5 mg into the skin once a week.  . gabapentin (NEURONTIN) 300 MG capsule Take 600 mg by mouth 4 (four) times daily.   Marland Kitchen. glucose blood (ACCU-CHEK GUIDE) test strip 1 each by Other route 4 (four) times daily. Use as instructed 4 x daily. E11.65  . insulin regular human CONCENTRATED (HUMULIN R U-500 KWIKPEN) 500 UNIT/ML kwikpen Inject 130 Units into the skin 3 (three) times daily with meals.  . Lancets MISC 1 each by Does not apply route as directed.  Marland Kitchen. lisinopril (PRINIVIL,ZESTRIL) 2.5 MG tablet Take 2.5 mg by mouth daily.  . metFORMIN (GLUCOPHAGE) 500 MG tablet Take 1,000 mg by mouth 2 (two) times daily.  Marland Kitchen. oxybutynin (DITROPAN) 5 MG tablet Take 5 mg by mouth 2 (two) times daily.  . pravastatin (PRAVACHOL) 20 MG tablet TAKE 1 TABLET BY MOUTH EVERY EVENING.     Allergies:   Peanut-containing drug products, Shellfish allergy, Gluten meal, Latex, Prednisone, Nexium [esomeprazole magnesium], and Tape   Social History   Tobacco Use  . Smoking status: Never Smoker  . Smokeless tobacco: Never Used  Substance Use Topics  . Alcohol use: No  . Drug use: No     Family Hx: The patient's family history includes Cancer in her mother; Diabetes in her father and sister. There is no history of Colon cancer.  ROS:    Please see the history of present illness.     All other systems reviewed and are negative.   Prior CV studies:   The following studies were reviewed Odom:  07/2016 echo Study Conclusions  - Left ventricle: The cavity size was normal. Wall thickness was increased in a pattern of mild LVH. Systolic function was normal. The estimated ejection fraction was in the range of 55% to 60%. Wall motion was normal; there were no regional wall motion abnormalities. Doppler parameters are consistent with abnormal left ventricular relaxation (grade 1 diastolic dysfunction). - Aortic valve: Valve area (VTI): 3.3 cm^2. Valve area (Vmax): 2.73 cm^2. - Technically adequate study.  08/2016 stress echo Study Conclusions  - Stress ECG conclusions: The stress ECG was normal. - Staged echo: Resting left ventricular systolic function was hyperdynamic, LVEF 65-70%. With  stress, there was augmented contractility of all wall segments, LVEF 85%. No evidence of stress induced ischemia. Normal echo stress  Impressions:  - No evidence of stress induced ischemia. Exercise tolerance was reduced. Clinical correlation indicated.  Labs/Other Tests and Data Reviewed:    EKG:  No ECG reviewed.  Recent Labs: 11/01/2018: ALT 52; BUN 11; Creat 0.69; Potassium 4.3; Sodium 132   Recent Lipid Panel Lab Results  Component Value Date/Time   CHOL 200 (H) 04/28/2017 11:01 AM   TRIG 137 04/28/2017 11:01 AM   HDL 59 04/28/2017 11:01 AM   CHOLHDL 3.4 04/28/2017 11:01 AM   LDLCALC 116 (H) 04/28/2017 11:01 AM    Wt Readings from Last 3 Encounters:  11/03/18 192 lb (87.1 kg)  04/07/18 198 lb (89.8 kg)  03/02/18 203 lb (92.1 kg)     Objective:    Vital Signs:  BP 117/78   Pulse (!) 120   Ht 5\' 5"  (1.651 m)   Wt 192 lb (87.1 kg)   BMI 31.95 kg/m    Normal affect. Normal speech pattern and tone. Comfortable, no apparent distress. No audible signs of SOB or wheezing.   ASSESSMENT &  PLAN:    1. Chest pain -no recent symptoms, continue to monitor  2. Palpitations - recent symptoms over the last few weeks. Heart rate by home monitor check 120 Odom - she will come tomorrow for a nursing visit for EKG and vitals check - pending results, may require a 1 week event monitor     COVID-19 Education: The signs and symptoms of COVID-19 were discussed with the patient and how to seek care for testing (follow up with PCP or arrange E-visit).  The importance of social distancing was discussed Odom.  Time:   Odom, I have spent 15 minutes with the patient with telehealth technology discussing the above problems.     Medication Adjustments/Labs and Tests Ordered: Current medicines are reviewed at length with the patient Odom.  Concerns regarding medicines are outlined above.   Tests Ordered: No orders of the defined types were placed in this encounter.   Medication Changes: No orders of the defined types were placed in this encounter.   Follow Up:  F/u pending nursing visit Signed, Dina RichBranch, Ayden Apodaca, MD  11/03/2018 2:37 PM    [facility]

## 2018-11-04 ENCOUNTER — Other Ambulatory Visit: Payer: Self-pay

## 2018-11-04 ENCOUNTER — Ambulatory Visit (INDEPENDENT_AMBULATORY_CARE_PROVIDER_SITE_OTHER): Payer: BC Managed Care – PPO

## 2018-11-04 VITALS — BP 102/76 | HR 111 | Temp 97.6°F | Ht 65.0 in | Wt 194.0 lb

## 2018-11-04 DIAGNOSIS — R000 Tachycardia, unspecified: Secondary | ICD-10-CM | POA: Diagnosis not present

## 2018-11-04 NOTE — Patient Instructions (Addendum)
Medication Instructions: Your physician recommends that you continue on your current medications as directed. Please refer to the Current Medication list given to you today.   Labwork: none  Procedures/Testing: Your physician has recommended that you wear an event monitor FOR 1 WEEK . Event monitors are medical devices that record the heart's electrical activity. Doctors most often [place] these monitors to diagnose arrhythmias. Arrhythmias are problems with the speed or rhythm of the heartbeat. The monitor is a small, portable device. You can wear one while you do your normal daily activities. This is usually used to diagnose what is causing palpitations/syncope (passing out).    Follow-Up:  3 weeks with Dr.Branch or a Physician Assistant  Any Additional Special Instructions Will Be Listed Below (If Applicable).     If you need a refill on your cardiac medications before your next appointment, please call your pharmacy.

## 2018-11-04 NOTE — Progress Notes (Signed)
EKG done and faxed to Dr.Branch   Await dispo   Per Dr.Branch, pt needs 1 week event and a f/u in 3 weeks with him or PA

## 2018-11-07 ENCOUNTER — Encounter: Payer: Self-pay | Admitting: "Endocrinology

## 2018-11-07 ENCOUNTER — Ambulatory Visit: Payer: BLUE CROSS/BLUE SHIELD | Admitting: "Endocrinology

## 2018-11-07 ENCOUNTER — Ambulatory Visit: Payer: BC Managed Care – PPO | Admitting: "Endocrinology

## 2018-11-07 ENCOUNTER — Other Ambulatory Visit: Payer: Self-pay

## 2018-11-07 VITALS — BP 127/84 | HR 98 | Ht 65.0 in | Wt 195.0 lb

## 2018-11-07 DIAGNOSIS — I10 Essential (primary) hypertension: Secondary | ICD-10-CM

## 2018-11-07 DIAGNOSIS — E782 Mixed hyperlipidemia: Secondary | ICD-10-CM

## 2018-11-07 DIAGNOSIS — E1165 Type 2 diabetes mellitus with hyperglycemia: Secondary | ICD-10-CM | POA: Diagnosis not present

## 2018-11-07 LAB — GLUCOSE, POCT (MANUAL RESULT ENTRY): POC Glucose: 515 mg/dl — AB (ref 70–99)

## 2018-11-07 MED ORDER — HUMULIN R U-500 KWIKPEN 500 UNIT/ML ~~LOC~~ SOPN: 150.0000 [IU] | PEN_INJECTOR | Freq: Three times a day (TID) | SUBCUTANEOUS | 2 refills | Status: DC

## 2018-11-07 MED ORDER — GLIPIZIDE ER 5 MG PO TB24: 5.0000 mg | ORAL_TABLET | Freq: Every day | ORAL | 3 refills | Status: DC

## 2018-11-07 NOTE — Patient Instructions (Signed)

## 2018-11-07 NOTE — Progress Notes (Signed)
11/07/2018, 4:56 PM   Endocrinology follow-up note    Subjective:    Patient ID: [name], female    DOB: 05-23-1965.  [name] is here to follow-up for the management of currently uncontrolled symptomatic 2 diabetes, hyperlipidemia, hypertension. PMD:   Elfredia NevinsFusco, Lawrence, MD.   Past Medical History:  Diagnosis Date  . Asthma   . Chronic abdominal pain   . Chronic neck pain    C4-6 fusion  . CONSTIPATION 05/16/2009   Qualifier: Diagnosis of  By: Yetta BarreJones FNP-BC, Kandice L   . Diabetes mellitus without complication (HCC)   . Dysphagia 02/23/2012  . Fatty liver   . GERD (gastroesophageal reflux disease)   . Helicobacter pylori gastritis 2011  . Hyperlipidemia   . Hypertension   . LIVER FUNCTION TESTS, ABNORMAL, HX OF 05/14/2009   Qualifier: Diagnosis of  By: Ricard DillonUrshel, [place]    . Migraine headache   . MIGRAINE, COMMON 05/14/2009   Qualifier: Diagnosis of  By: Ricard DillonUrshel, [place]    . MS (multiple sclerosis) (HCC)   . Sleep apnea    Past Surgical History:  Procedure Laterality Date  . ABDOMINAL HYSTERECTOMY    . APPENDECTOMY    . BACK SURGERY  03/07/2012   Nerve Stimulator  . CHOLECYSTECTOMY    . COLONOSCOPY  07/09/2009   RMR; normal rectum aside from anal canal hemorrhoids/scattered left-sided diverticula  . ESOPHAGOGASTRODUODENOSCOPY  05/22/2009   RMR; normal /small HH  . ESOPHAGOGASTRODUODENOSCOPY  2007   RMR: non-critical Schatzki's rin, non-maniuplated  . ESOPHAGOGASTRODUODENOSCOPY N/A 10/24/2013   Procedure: ESOPHAGOGASTRODUODENOSCOPY (EGD);  Surgeon: Corbin Adeobert M Rourk, MD;  Location: [facility];  Service: Endoscopy;  Laterality: N/A;  9:45  . ESOPHAGOGASTRODUODENOSCOPY (EGD) WITH ESOPHAGEAL DILATION  03/17/2012   WUJ:[id number]:Abnormal appearing esophageal mucosa of uncertain significance. Status post Elease HashimotoMaloney dilation followed by esophageal bx  . MALONEY  DILATION N/A 10/24/2013   Procedure: Elease HashimotoMALONEY DILATION;  Surgeon: Corbin Adeobert M Rourk, MD;  Location: [facility];  Service: Endoscopy;  Laterality: N/A;  . MANDIBLE SURGERY    . NECK SURGERY     X2, last time 2013   Social History   Socioeconomic History  . Marital status: Married    Spouse name: Not on file  . Number of children: Not on file  . Years of education: Not on file  . Highest education level: Not on file  Occupational History  . Not on file  Social Needs  . Financial resource strain: Not on file  . Food insecurity    Worry: Not on file    Inability: Not on file  . Transportation needs    Medical: Not on file    Non-medical: Not on file  Tobacco Use  . Smoking status: Never Smoker  . Smokeless tobacco: Never Used  Substance and Sexual Activity  . Alcohol use: No  . Drug use: No  . Sexual  activity: Yes    Birth control/protection: Surgical  Lifestyle  . Physical activity    Days per week: Not on file    Minutes per session: Not on file  . Stress: Not on file  Relationships  . Social Herbalist on phone: Not on file    Gets together: Not on file    Attends religious service: Not on file    Active member of club or organization: Not on file    Attends meetings of clubs or organizations: Not on file    Relationship status: Not on file  Other Topics Concern  . Not on file  Social History Narrative   5 kids, raising grandchild   Outpatient Encounter Medications as of 11/07/2018  Medication Sig  . aspirin EC 81 MG tablet Take 81 mg by mouth daily.  . B-D ULTRAFINE III SHORT PEN 31G X 8 MM MISC USE AS DIRECTED ONCE DAILY.  . baclofen (LIORESAL) 20 MG tablet Take 20 mg by mouth 3 (three) times daily.   . citalopram (CELEXA) 40 MG tablet Take 40 mg by mouth every evening.   . Continuous Blood Gluc Receiver ([place]) Pettis 1 each by Does not apply route as directed.  . Continuous Blood Gluc Sensor (DEXCOM G6 SENSOR) MISC APPLY 1 SENSOR TO SKIN  ONCE A WEEK TO CHECK BLOOD SUGAR  . Continuous Blood Gluc Transmit (DEXCOM G6 TRANSMITTER) MISC AS DIRECTED TO CHECK BLOOD SUGAR  . Dulaglutide (TRULICITY) 1.5 WN/0.2VO SOPN Inject 1.5 mg into the skin once a week.  . gabapentin (NEURONTIN) 300 MG capsule Take 600 mg by mouth 4 (four) times daily.   Marland Kitchen glipiZIDE (GLUCOTROL XL) 5 MG 24 hr tablet Take 1 tablet (5 mg total) by mouth daily with breakfast.  . glucose blood (ACCU-CHEK GUIDE) test strip 1 each by Other route 4 (four) times daily. Use as instructed 4 x daily. E11.65  . insulin regular human CONCENTRATED (HUMULIN R U-500 KWIKPEN) 500 UNIT/ML kwikpen Inject 150 Units into the skin 3 (three) times daily with meals.  . Lancets MISC 1 each by Does not apply route as directed.  Marland Kitchen lisinopril (PRINIVIL,ZESTRIL) 2.5 MG tablet Take 2.5 mg by mouth daily.  . metFORMIN (GLUCOPHAGE) 500 MG tablet Take 1,000 mg by mouth 2 (two) times daily.  . pravastatin (PRAVACHOL) 20 MG tablet TAKE 1 TABLET BY MOUTH EVERY EVENING.  . [DISCONTINUED] insulin regular human CONCENTRATED (HUMULIN R U-500 KWIKPEN) 500 UNIT/ML kwikpen Inject 130 Units into the skin 3 (three) times daily with meals.  . [DISCONTINUED] oxybutynin (DITROPAN) 5 MG tablet Take 5 mg by mouth 2 (two) times daily.   No facility-administered encounter medications on file as of 11/07/2018.     ALLERGIES: Allergies  Allergen Reactions  . Peanut-Containing Drug Products Anaphylaxis and Hives    Patient states she is allergic to all nuts  . Shellfish Allergy Anaphylaxis  . Gluten Meal   . Latex Other (See Comments)    Reaction: blistering  . Prednisone Hives and Other (See Comments)    Reaction: causes psychological issues   . Nexium [Esomeprazole Magnesium] Hives  . Tape Rash    Paper tape please    VACCINATION STATUS: Immunization History  Administered Date(s) Administered  . Influenza Split 04/17/2012  . Pneumococcal Polysaccharide-23 04/17/2012    Diabetes She presents for her  follow-up diabetic visit. She has type 2 diabetes mellitus. Onset time: She was diagnosed at approximate age of 53 years. Her disease course has  been worsening. There are no hypoglycemic associated symptoms. Pertinent negatives for hypoglycemia include no confusion, pallor or seizures. Associated symptoms include fatigue, polydipsia and polyuria. Pertinent negatives for diabetes include no polyphagia. There are no hypoglycemic complications. Symptoms are worsening. There are no diabetic complications. Risk factors for coronary artery disease include diabetes mellitus, hypertension, obesity, sedentary lifestyle and dyslipidemia. Current diabetic treatment includes insulin injections. She is compliant with treatment most of the time. Her weight is fluctuating minimally. She is following a generally healthy (she admits to consumption of large quantities of soda.) diet. When asked about meal planning, she reported none. She has not had a previous visit with a dietitian. She never participates in exercise. Her home blood glucose trend is increasing steadily. Her breakfast blood glucose range is generally >200 mg/dl. Her lunch blood glucose range is generally >200 mg/dl. Her dinner blood glucose range is generally >200 mg/dl. Her bedtime blood glucose range is generally >200 mg/dl. Her overall blood glucose range is >200 mg/dl. (Despite home current regimen with high-dose insulin U500 she returns with persistently severe hyperglycemia and A1c of >14%.) An ACE inhibitor/angiotensin II receptor blocker is being taken.  Hyperlipidemia This is a chronic problem. The current episode started more than 1 year ago. The problem is uncontrolled. Exacerbating diseases include diabetes and obesity. Pertinent negatives include no myalgias. Current antihyperlipidemic treatment includes statins. Risk factors for coronary artery disease include dyslipidemia, diabetes mellitus, hypertension, obesity and a sedentary lifestyle.     Objective:    BP 127/84   Pulse 98   Ht 5\' 5"  (1.651 m)   Wt 195 lb (88.5 kg)   BMI 32.45 kg/m   Wt Readings from Last 3 Encounters:  11/07/18 195 lb (88.5 kg)  11/04/18 194 lb (88 kg)  11/03/18 192 lb (87.1 kg)      Recent Results (from the past 2160 hour(s))  COMPLETE METABOLIC PANEL WITH GFR     Status: Abnormal   Collection Time: 11/01/18 12:00 PM  Result Value Ref Range   Glucose, Bld 573 (HH) 65 - 139 mg/dL    Comment: Verified by repeat analysis. . .        Non-fasting reference interval .    BUN 11 7 - 25 mg/dL   Creat 7.25 3.66 - 4.40 mg/dL    Comment: For patients >35 years of age, the reference limit for Creatinine is approximately 13% higher for people identified as African-American. .    GFR, Est Non African American 99 > OR = 60 mL/min/1.68m2   GFR, Est African American 115 > OR = 60 mL/min/1.39m2   BUN/Creatinine Ratio NOT APPLICABLE 6 - 22 (calc)   Sodium 132 (L) 135 - 146 mmol/L   Potassium 4.3 3.5 - 5.3 mmol/L   Chloride 95 (L) 98 - 110 mmol/L   CO2 26 20 - 32 mmol/L   Calcium 8.6 8.6 - 10.4 mg/dL   Total Protein 7.4 6.1 - 8.1 g/dL   Albumin 3.6 3.6 - 5.1 g/dL   Globulin 3.8 (H) 1.9 - 3.7 g/dL (calc)   AG Ratio 0.9 (L) 1.0 - 2.5 (calc)   Total Bilirubin 0.4 0.2 - 1.2 mg/dL   Alkaline phosphatase (APISO) 113 37 - 153 U/L   AST 52 (H) 10 - 35 U/L   ALT 52 (H) 6 - 29 U/L  Hemoglobin A1c     Status: Abnormal   Collection Time: 11/01/18 12:00 PM  Result Value Ref Range   Hgb A1c MFr Bld >14.0 (H) <5.7 %  of total Hgb    Comment: Verified by repeat analysis. . For someone without known diabetes, a hemoglobin A1c value of 6.5% or greater indicates that they may have  diabetes and this should be confirmed with a follow-up  test. . For someone with known diabetes, a value <7% indicates  that their diabetes is well controlled and a value  greater than or equal to 7% indicates suboptimal  control. A1c targets should be individualized based on   duration of diabetes, age, comorbid conditions, and  other considerations. . Currently, no consensus exists regarding use of hemoglobin A1c for diagnosis of diabetes for children. .    Mean Plasma Glucose  (calc)    Comment: eAG cannot be calculated. Hemoglobin A1c result exceeds the linearity of the assay.   POCT glucose (manual entry)     Status: Abnormal   Collection Time: 11/07/18  3:18 PM  Result Value Ref Range   POC Glucose 515 (A) 70 - 99 mg/dl    Comment: 0.4VWU3.5hrs PC    Diabetic Labs (most recent): Lab Results  Component Value Date   HGBA1C >14.0 (H) 11/01/2018   HGBA1C 13.9 (H) 07/05/2018   HGBA1C 13.7 (H) 04/01/2018     Lipid Panel ( most recent) Lipid Panel     Component Value Date/Time   CHOL 200 (H) 04/28/2017 1101   TRIG 137 04/28/2017 1101   HDL 59 04/28/2017 1101   CHOLHDL 3.4 04/28/2017 1101   VLDL 28 04/17/2012 0644   LDLCALC 116 (H) 04/28/2017 1101      Lab Results  Component Value Date   TSH 0.31 (L) 04/28/2017   TSH 0.667 04/16/2012   FREET4 1.1 04/28/2017       Assessment & Plan:   1. Uncontrolled type 2 diabetes mellitus with hyperglycemia (HCC)  - [name] has currently uncontrolled symptomatic type 2 DM since  [age] years of age. -This patient continues to struggle to control her diabetes to target, despite large dose of insulin utilizing insulin U500.   -Her previsit labs show A1c of  > 14%. -her diabetes is complicated by obesity/ sedentary life and [name] remains at extremely high risk for more acute and chronic complications which include CAD, CVA, CKD, retinopathy, and neuropathy. These are all discussed in detail with the patient.  - I have counseled her on diet management and weight loss, by adopting a carbohydrate restricted/protein rich diet.  She admits to continued consumption of large quantities of soda.  - she  admits there is a room for improvement in her diet and drink choices. -  Suggestion is made for her to  avoid simple carbohydrates  from her diet including Cakes, Sweet Desserts / Pastries, Ice Cream, Soda (diet and regular), Sweet Tea, Candies, Chips, Cookies, Sweet Pastries,  Store Bought Juices, Alcohol in Excess of  1-2 drinks a day, Artificial Sweeteners, Coffee Creamer, and "Sugar-free" Products. This will help patient to have stable blood glucose profile and potentially avoid unintended weight gain.  - I encouraged her to switch to  unprocessed or minimally processed complex starch and increased protein intake (animal or plant source), fruits, and vegetables.  - she is advised to stick to a routine mealtimes to eat 3 meals  a day and avoid unnecessary snacks ( to snack only to correct hypoglycemia).   - I have approached her with the following individualized plan to manage diabetes and patient agrees:    - Based on her consistently above target glycemic profile,  she will continue to require higher dose of insulin.    -I discussed and increased her Humulin U500- to 150  units 3 times daily AC, 20 minutes before meals, associated with strict monitoring of blood glucose 4 times a day minimum.  She wears the freestyle libre device for CGM, encouraged to use it properly.  -She is advised not to use any other kind of insulin while taking insulin U500.    -Once again, she is encouraged to call clinic for blood glucose levels less than 70 or above 300 mg /dl. -She is advised to continue metformin 1000 mg p.o. twice daily, therapeutically suitable for patient . -She also advised to continue Trulicity 1.5 mg subcutaneously weekly.    Side effects and precautions discussed with her. -I discussed and added glipizide 5 mg p.o. daily at breakfast.  2) Lipids/HPL: Her last lipid panel showed improving LDL of 116, improving from 141.  She is advised to continue atorvastatin 20 mg p.o. nightly.           Side effects and precautions discussed with her.  -She will be considered for high intensity statin,  atorvastatin 80 mg nightly on subsequent visits.  3) hypertension: Her blood pressure is controlled to target.  She is advised to continue lisinopril 2.5 mg daily.  - I advised patient to maintain close follow up with Elfredia NevinsFusco, Lawrence, MD for primary care needs.  - Time spent with the patient: 25 min, of which >50% was spent in reviewing her blood glucose logs , discussing her hypoglycemia and hyperglycemia episodes, reviewing her current and  previous labs / studies and medications  doses and developing a plan to avoid hypoglycemia and hyperglycemia. Please refer to Patient Instructions for Blood Glucose Monitoring and Insulin/Medications Dosing Guide"  in media tab for additional information. Please  also refer to " Patient Self Inventory" in the Media  tab for reviewed elements of pertinent patient history.  [name] participated in the discussions, expressed understanding, and voiced agreement with the above plans.  All questions were answered to her satisfaction. she is encouraged to contact clinic should she have any questions or concerns prior to her return visit.   Follow up plan: - Return in about 10 days (around 11/17/2018) for Follow up with Meter and Logs Only - no Labs.  Marquis LunchGebre Aaron Bostwick, MD [facility] [address] [place], [place] [address] Phone: [phone]  Fax: [phone]    11/07/2018, 4:56 PM  This note was partially dictated with voice recognition software. Similar sounding words can be transcribed inadequately or may not  be corrected upon review.

## 2018-11-21 ENCOUNTER — Ambulatory Visit: Payer: BC Managed Care – PPO | Admitting: "Endocrinology

## 2018-11-21 ENCOUNTER — Other Ambulatory Visit: Payer: Self-pay

## 2018-11-21 ENCOUNTER — Encounter: Payer: Self-pay | Admitting: "Endocrinology

## 2018-11-21 ENCOUNTER — Ambulatory Visit (INDEPENDENT_AMBULATORY_CARE_PROVIDER_SITE_OTHER): Payer: BC Managed Care – PPO | Admitting: "Endocrinology

## 2018-11-21 DIAGNOSIS — E1165 Type 2 diabetes mellitus with hyperglycemia: Secondary | ICD-10-CM | POA: Diagnosis not present

## 2018-11-21 DIAGNOSIS — I10 Essential (primary) hypertension: Secondary | ICD-10-CM

## 2018-11-21 DIAGNOSIS — E782 Mixed hyperlipidemia: Secondary | ICD-10-CM

## 2018-11-21 MED ORDER — HUMULIN R U-500 KWIKPEN 500 UNIT/ML ~~LOC~~ SOPN: 180.0000 [IU] | PEN_INJECTOR | Freq: Three times a day (TID) | SUBCUTANEOUS | 2 refills | Status: DC

## 2018-11-21 MED ORDER — DEXCOM G6 SENSOR MISC: 4.0000 | 2 refills | Status: DC

## 2018-11-21 NOTE — Progress Notes (Signed)
11/21/2018, 4:08 PM                                                      Endocrinology Telehealth Visit Follow up Note -During COVID -19 Pandemic  This visit type was conducted due to national recommendations for restrictions regarding the COVID-19 Pandemic  in an effort to limit this patient's exposure and mitigate transmission of the corona virus.  Due to her co-morbid illnesses, [name] is at  moderate to high risk for complications without adequate follow up.  This format is felt to be most appropriate for her at this time.  I connected with this patient on 11/21/2018   by telephone and verified that I am speaking with the correct person using two identifiers. [name], [birth year]. she has verbally consented to this visit. All issues noted in this document were discussed and addressed. The format was not optimal for physical exam.    Subjective:    Patient ID: [name], female    DOB: [birth year].  [name] is engaged in telehealth via telephone for follow-up for the management of currently uncontrolled symptomatic 2 diabetes, hyperlipidemia, hypertension. PMD:   Elfredia NevinsFusco, [place], MD.   Past Medical History:  Diagnosis Date  . Asthma   . Chronic abdominal pain   . Chronic neck pain    C4-6 fusion  . CONSTIPATION 05/16/2009   Qualifier: Diagnosis of  By: Yetta BarreJones FNP-BC, Kandice L   . Diabetes mellitus without complication (HCC)   . Dysphagia 02/23/2012  . Fatty liver   . GERD (gastroesophageal reflux disease)   . Helicobacter pylori gastritis 2011  . Hyperlipidemia   . Hypertension   . LIVER FUNCTION TESTS, ABNORMAL, HX OF 05/14/2009   Qualifier: Diagnosis of  By: Ricard DillonUrshel, [place]    . Migraine headache   . MIGRAINE, COMMON 05/14/2009   Qualifier: Diagnosis of  By: Ricard DillonUrshel, [place]    . MS (multiple sclerosis) (HCC)   . Sleep apnea    Past Surgical  History:  Procedure Laterality Date  . ABDOMINAL HYSTERECTOMY    . APPENDECTOMY    . BACK SURGERY  03/07/2012   Nerve Stimulator  . CHOLECYSTECTOMY    . COLONOSCOPY  07/09/2009   RMR; normal rectum aside from anal canal hemorrhoids/scattered left-sided diverticula  . ESOPHAGOGASTRODUODENOSCOPY  05/22/2009   RMR; normal /small HH  . ESOPHAGOGASTRODUODENOSCOPY  2007   RMR: non-critical Schatzki's rin, non-maniuplated  . ESOPHAGOGASTRODUODENOSCOPY N/A 10/24/2013   Procedure: ESOPHAGOGASTRODUODENOSCOPY (EGD);  Surgeon: Corbin Adeobert M Rourk, MD;  Location: [facility];  Service: Endoscopy;  Laterality: N/A;  9:45  . ESOPHAGOGASTRODUODENOSCOPY (EGD) WITH ESOPHAGEAL DILATION  03/17/2012   ZOX:[id number]:Abnormal appearing esophageal mucosa of uncertain significance. Status post Elease HashimotoMaloney  dilation followed by esophageal bx  . MALONEY DILATION N/A 10/24/2013   Procedure: Elease HashimotoMALONEY DILATION;  Surgeon: Corbin Adeobert M Rourk, MD;  Location: [facility];  Service: Endoscopy;  Laterality: N/A;  . MANDIBLE SURGERY    . NECK SURGERY     X2, last time 2013   Social History   Socioeconomic History  . Marital status: Married    Spouse name: Not on file  . Number of children: Not on file  . Years of education: Not on file  . Highest education level: Not on file  Occupational History  . Not on file  Social Needs  . Financial resource strain: Not on file  . Food insecurity    Worry: Not on file    Inability: Not on file  . Transportation needs    Medical: Not on file    Non-medical: Not on file  Tobacco Use  . Smoking status: Never Smoker  . Smokeless tobacco: Never Used  Substance and Sexual Activity  . Alcohol use: No  . Drug use: No  . Sexual activity: Yes    Birth control/protection: Surgical  Lifestyle  . Physical activity    Days per week: Not on file    Minutes per session: Not on file  . Stress: Not on file  Relationships  . Social Musicianconnections    Talks on phone: Not on file    Gets together: Not on  file    Attends religious service: Not on file    Active member of club or organization: Not on file    Attends meetings of clubs or organizations: Not on file    Relationship status: Not on file  Other Topics Concern  . Not on file  Social History Narrative   5 kids, raising grandchild   Outpatient Encounter Medications as of 11/21/2018  Medication Sig  . aspirin EC 81 MG tablet Take 81 mg by mouth daily.  . B-D ULTRAFINE III SHORT PEN 31G X 8 MM MISC USE AS DIRECTED ONCE DAILY.  . baclofen (LIORESAL) 20 MG tablet Take 20 mg by mouth 3 (three) times daily.   . citalopram (CELEXA) 40 MG tablet Take 40 mg by mouth every evening.   . Continuous Blood Gluc Receiver (DEXCOM G6 RECEIVER) DEVI 1 each by Does not apply route as directed.  . Continuous Blood Gluc Sensor (DEXCOM G6 SENSOR) MISC 4 Pieces by Does not apply route once a week.  . Continuous Blood Gluc Transmit (DEXCOM G6 TRANSMITTER) MISC AS DIRECTED TO CHECK BLOOD SUGAR  . Dulaglutide (TRULICITY) 1.5 MG/0.5ML SOPN Inject 1.5 mg into the skin once a week.  . gabapentin (NEURONTIN) 300 MG capsule Take 600 mg by mouth 4 (four) times daily.   Marland Kitchen. glipiZIDE (GLUCOTROL XL) 5 MG 24 hr tablet Take 1 tablet (5 mg total) by mouth daily with breakfast.  . glucose blood (ACCU-CHEK GUIDE) test strip 1 each by Other route 4 (four) times daily. Use as instructed 4 x daily. E11.65  . insulin regular human CONCENTRATED (HUMULIN R U-500 KWIKPEN) 500 UNIT/ML kwikpen Inject 180 Units into the skin 3 (three) times daily with meals.  . Lancets MISC 1 each by Does not apply route as directed.  Marland Kitchen. lisinopril (PRINIVIL,ZESTRIL) 2.5 MG tablet Take 2.5 mg by mouth daily.  . metFORMIN (GLUCOPHAGE) 500 MG tablet Take 1,000 mg by mouth 2 (two) times daily.  . pravastatin (PRAVACHOL) 20 MG tablet TAKE 1 TABLET BY MOUTH EVERY EVENING.  . [DISCONTINUED] Continuous Blood Gluc Sensor (  DEXCOM G6 SENSOR) MISC APPLY 1 SENSOR TO SKIN ONCE A WEEK TO CHECK BLOOD SUGAR  .  [DISCONTINUED] insulin regular human CONCENTRATED (HUMULIN R U-500 KWIKPEN) 500 UNIT/ML kwikpen Inject 150 Units into the skin 3 (three) times daily with meals.   No facility-administered encounter medications on file as of 11/21/2018.     ALLERGIES: Allergies  Allergen Reactions  . Peanut-Containing Drug Products Anaphylaxis and Hives    Patient states she is allergic to all nuts  . Shellfish Allergy Anaphylaxis  . Gluten Meal   . Latex Other (See Comments)    Reaction: blistering  . Prednisone Hives and Other (See Comments)    Reaction: causes psychological issues   . Nexium [Esomeprazole Magnesium] Hives  . Tape Rash    Paper tape please    VACCINATION STATUS: Immunization History  Administered Date(s) Administered  . Influenza Split 04/17/2012  . Pneumococcal Polysaccharide-23 04/17/2012    Diabetes She presents for her follow-up diabetic visit. She has type 2 diabetes mellitus. Onset time: She was diagnosed at approximate age of 48 years. Her disease course has been worsening. There are no hypoglycemic associated symptoms. Pertinent negatives for hypoglycemia include no confusion, pallor or seizures. Associated symptoms include fatigue, polydipsia and polyuria. Pertinent negatives for diabetes include no polyphagia. There are no hypoglycemic complications. Symptoms are worsening. There are no diabetic complications. Risk factors for coronary artery disease include diabetes mellitus, hypertension, obesity, sedentary lifestyle and dyslipidemia. Current diabetic treatment includes insulin injections. She is compliant with treatment most of the time. Her weight is fluctuating minimally. She is following a generally healthy (she admits to consumption of large quantities of soda.) diet. When asked about meal planning, she reported none. She has not had a previous visit with a dietitian. She never participates in exercise. Her home blood glucose trend is increasing steadily. Her breakfast  blood glucose range is generally >200 mg/dl. Her lunch blood glucose range is generally >200 mg/dl. Her dinner blood glucose range is generally >200 mg/dl. Her bedtime blood glucose range is generally >200 mg/dl. Her overall blood glucose range is >200 mg/dl. (Despite home current regimen with high-dose insulin U500 she returns with persistently severe hyperglycemia and A1c of >14%.) An ACE inhibitor/angiotensin II receptor blocker is being taken.  Hyperlipidemia This is a chronic problem. The current episode started more than 1 year ago. The problem is uncontrolled. Exacerbating diseases include diabetes and obesity. Pertinent negatives include no myalgias. Current antihyperlipidemic treatment includes statins. Risk factors for coronary artery disease include dyslipidemia, diabetes mellitus, hypertension, obesity and a sedentary lifestyle.    Objective:    There were no vitals taken for this visit.  Wt Readings from Last 3 Encounters:  11/07/18 195 lb (88.5 kg)  11/04/18 194 lb (88 kg)  11/03/18 192 lb (87.1 kg)      Recent Results (from the past 2160 hour(s))  COMPLETE METABOLIC PANEL WITH GFR     Status: Abnormal   Collection Time: 11/01/18 12:00 PM  Result Value Ref Range   Glucose, Bld 573 (HH) 65 - 139 mg/dL    Comment: Verified by repeat analysis. . .        Non-fasting reference interval .    BUN 11 7 - 25 mg/dL   Creat 1.610.69 0.960.50 - 0.451.05 mg/dL    Comment: For patients >53 years of age, the reference limit for Creatinine is approximately 13% higher for people identified as African-American. .    GFR, Est Non African American 99 > OR =  60 mL/min/1.8m2   GFR, Est African American 115 > OR = 60 mL/min/1.32m2   BUN/Creatinine Ratio NOT APPLICABLE 6 - 22 (calc)   Sodium 132 (L) 135 - 146 mmol/L   Potassium 4.3 3.5 - 5.3 mmol/L   Chloride 95 (L) 98 - 110 mmol/L   CO2 26 20 - 32 mmol/L   Calcium 8.6 8.6 - 10.4 mg/dL   Total Protein 7.4 6.1 - 8.1 g/dL   Albumin 3.6 3.6 - 5.1  g/dL   Globulin 3.8 (H) 1.9 - 3.7 g/dL (calc)   AG Ratio 0.9 (L) 1.0 - 2.5 (calc)   Total Bilirubin 0.4 0.2 - 1.2 mg/dL   Alkaline phosphatase (APISO) 113 37 - 153 U/L   AST 52 (H) 10 - 35 U/L   ALT 52 (H) 6 - 29 U/L  Hemoglobin A1c     Status: Abnormal   Collection Time: 11/01/18 12:00 PM  Result Value Ref Range   Hgb A1c MFr Bld >14.0 (H) <5.7 % of total Hgb    Comment: Verified by repeat analysis. . For someone without known diabetes, a hemoglobin A1c value of 6.5% or greater indicates that they may have  diabetes and this should be confirmed with a follow-up  test. . For someone with known diabetes, a value <7% indicates  that their diabetes is well controlled and a value  greater than or equal to 7% indicates suboptimal  control. A1c targets should be individualized based on  duration of diabetes, age, comorbid conditions, and  other considerations. . Currently, no consensus exists regarding use of hemoglobin A1c for diagnosis of diabetes for children. .    Mean Plasma Glucose  (calc)    Comment: eAG cannot be calculated. Hemoglobin A1c result exceeds the linearity of the assay.   POCT glucose (manual entry)     Status: Abnormal   Collection Time: 11/07/18  3:18 PM  Result Value Ref Range   POC Glucose 515 (A) 70 - 99 mg/dl    Comment: 3.5hrs PC    Diabetic Labs (most recent): Lab Results  Component Value Date   HGBA1C >14.0 (H) 11/01/2018   HGBA1C 13.9 (H) 07/05/2018   HGBA1C 13.7 (H) 04/01/2018     Lipid Panel ( most recent) Lipid Panel     Component Value Date/Time   CHOL 200 (H) 04/28/2017 1101   TRIG 137 04/28/2017 1101   HDL 59 04/28/2017 1101   CHOLHDL 3.4 04/28/2017 1101   VLDL 28 04/17/2012 0644   LDLCALC 116 (H) 04/28/2017 1101      Lab Results  Component Value Date   TSH 0.31 (L) 04/28/2017   TSH 0.667 04/16/2012   FREET4 1.1 04/28/2017       Assessment & Plan:   1. Uncontrolled type 2 diabetes mellitus with hyperglycemia  ([place])  - [name] has currently uncontrolled symptomatic type 2 DM since  53 years of age. -This patient continues to have persistent hyperglycemia despite high-dose of  large dose of insulin utilizing insulin U500.   -Her previsit labs show A1c of  > 14%. -her diabetes is complicated by obesity/ sedentary life and [name] remains at extremely high risk for more acute and chronic complications which include CAD, CVA, CKD, retinopathy, and neuropathy. These are all discussed in detail with the patient.  - I have counseled her on diet management and weight loss, by adopting a carbohydrate restricted/protein rich diet.  She admits to continued consumption of large quantities of soda.  -  she  admits there is a room for improvement in her diet and drink choices. -  Suggestion is made for her to avoid simple carbohydrates  from her diet including Cakes, Sweet Desserts / Pastries, Ice Cream, Soda (diet and regular), Sweet Tea, Candies, Chips, Cookies, Sweet Pastries,  Store Bought Juices, Alcohol in Excess of  1-2 drinks a day, Artificial Sweeteners, Coffee Creamer, and "Sugar-free" Products. This will help patient to have stable blood glucose profile and potentially avoid unintended weight gain.   - I encouraged her to switch to  unprocessed or minimally processed complex starch and increased protein intake (animal or plant source), fruits, and vegetables.  - she is advised to stick to a routine mealtimes to eat 3 meals  a day and avoid unnecessary snacks ( to snack only to correct hypoglycemia).   - I have approached her with the following individualized plan to manage diabetes and patient agrees:    - Based on her consistently above target glycemic profile, she will continue to require higher dose of insulin.    -I discussed and increased her Humulin U500- to 180  units 3 times daily AC, 20 minutes before meals, associated with strict monitoring of blood glucose 4 times a day minimum.  She  wears the freestyle libre device for CGM, encouraged to use it properly.  -She is advised not to use any other kind of insulin while taking insulin U500.    -Once again, she is encouraged to call clinic for blood glucose levels less than 70 or above 300 mg /dl. -She is advised to continue metformin 1000 mg p.o. twice daily, therapeutically suitable for patient . -She also advised to continue Trulicity 1.5 mg subcutaneously weekly.    Side effects and precautions discussed with her. -I discussed and added glipizide 5 mg p.o. daily at breakfast.  2) Lipids/HPL: Her last lipid panel showed improving LDL of 116, improving from 141.  She is advised to continue atorvastatin 20 mg p.o. nightly.           Side effects and precautions discussed with her.  -She will be considered for high intensity statin, atorvastatin 80 mg nightly on subsequent visits.  3) hypertension:  Her blood pressure is controlled to target.she is advised to home monitor blood pressure and report if > 140/90 on 2 separate readings. She is advised to continue lisinopril 2.5 mg daily.  - I advised patient to maintain close follow up with Elfredia Nevins, MD for primary care needs.  - Patient Care Time Today:  25 min, of which >50% was spent in  counseling and the rest reviewing her  current and  previous labs/studies, previous treatments, her blood glucose readings, and medications' doses and developing a plan for long-term care based on the latest recommendations for standards of care.   [name] participated in the discussions, expressed understanding, and voiced agreement with the above plans.  All questions were answered to her satisfaction. she is encouraged to contact clinic should she have any questions or concerns prior to her return visit.  Follow up plan: - Return in about 10 days (around 12/01/2018), or phone, for Follow up with [facility] - no Labs.  Marquis Lunch, MD [facility] [address] [place], [place] [address] Phone: [phone]  Fax: [phone]    11/21/2018, 4:08 PM  This note was partially dictated with voice recognition software. Similar sounding words can be transcribed inadequately or may not  be corrected upon review.

## 2018-11-23 ENCOUNTER — Ambulatory Visit (INDEPENDENT_AMBULATORY_CARE_PROVIDER_SITE_OTHER): Payer: BC Managed Care – PPO

## 2018-11-23 DIAGNOSIS — R000 Tachycardia, unspecified: Secondary | ICD-10-CM

## 2018-11-30 ENCOUNTER — Telehealth (INDEPENDENT_AMBULATORY_CARE_PROVIDER_SITE_OTHER): Payer: BC Managed Care – PPO | Admitting: Student

## 2018-11-30 ENCOUNTER — Encounter: Payer: Self-pay | Admitting: Student

## 2018-11-30 VITALS — BP 112/78 | HR 100 | Ht 65.0 in | Wt 194.0 lb

## 2018-11-30 DIAGNOSIS — R002 Palpitations: Secondary | ICD-10-CM | POA: Diagnosis not present

## 2018-11-30 DIAGNOSIS — R000 Tachycardia, unspecified: Secondary | ICD-10-CM

## 2018-11-30 DIAGNOSIS — E785 Hyperlipidemia, unspecified: Secondary | ICD-10-CM

## 2018-11-30 DIAGNOSIS — I10 Essential (primary) hypertension: Secondary | ICD-10-CM

## 2018-11-30 DIAGNOSIS — E1165 Type 2 diabetes mellitus with hyperglycemia: Secondary | ICD-10-CM

## 2018-11-30 MED ORDER — METOPROLOL TARTRATE 25 MG PO TABS: ORAL_TABLET | ORAL | 3 refills | Status: DC

## 2018-11-30 NOTE — Progress Notes (Signed)
Virtual Visit via Telephone Note   This visit type was conducted due to national recommendations for restrictions regarding the COVID-19 Pandemic (e.g. social distancing) in an effort to limit this patient's exposure and mitigate transmission in our community.  Due to her co-morbid illnesses, this patient is at least at moderate risk for complications without adequate follow up.  This format is felt to be most appropriate for this patient at this time.  The patient did not have access to video technology/had technical difficulties with video requiring transitioning to audio format only (telephone).  All issues noted in this document were discussed and addressed.  No physical exam could be performed with this format.  Please refer to the patient's chart for her  consent to telehealth for [facility].   Date:  11/30/2018   ID:  [name], DOB 07/23/1965, MRN [id number]  Patient Location: Home Provider Location: Office  PCP:  Redmond School, MD  Cardiologist:  Carlyle Dolly, MD  Electrophysiologist:  None   Evaluation Performed:  Follow-Up Visit  Chief Complaint: Follow-up from Event Monitor  History of Present Illness:    [name] is a 53 y.o. female with past medical history of chest pain (negative stress echo in 2018), HTN, HLD, IDDM and palpitations who presents for a 3-week follow-up telehealth visit.   She most recent had a phone visit with Dr. Harl Bowie on 11/03/2018 and reported episodes of her heart racing for a few weeks with associated dyspnea. Rates had been in the 120's when checked at times. It was recommended she come for an EKG and consideration of event monitor. EKG showed sinus tachycardia, HR 102 with no acute ST changes. An event monitor was recommended for further assessment and showed less than 1% PVCs and less than 1% supraventricular ectopic beats. Average heart rate was 99 with a range from 63 bpm to 145 bpm. She did have episodes of sustained tachycardia with the  fastest rates being 123 bpm but no evidence of atrial fibrillation or atrial flutter. Official report of the monitor is pending.   In talking with the patient today, she reports still having palpitations occurring several days per week.  Symptoms can last from seconds to minutes at a time and she experiences associated dyspnea and dizziness when this occurs. She is unaware of any precipitating events. Says she only consumes a glass of wine every so many months and has significantly reduced her caffeine intake.  She denies any recent chest pain, orthopnea, PND, or lower extremity edema. BP has overall been well controlled when checked at home, at 112/78 on most recent check.  The patient does not have symptoms concerning for COVID-19 infection (fever, chills, cough, or new shortness of breath).    Past Medical History:  Diagnosis Date   Asthma    Chronic abdominal pain    Chronic neck pain    C4-6 fusion   CONSTIPATION 05/16/2009   Qualifier: Diagnosis of  By: Ronnald Ramp FNP-BC, Kandice L    Diabetes mellitus without complication ([place])    Dysphagia 02/23/2012   Fatty liver    GERD (gastroesophageal reflux disease)    Helicobacter pylori gastritis 2011   Hyperlipidemia    Hypertension    LIVER FUNCTION TESTS, ABNORMAL, HX OF 05/14/2009   Qualifier: Diagnosis of  By: Olena Heckle, [place]     Migraine headache    MIGRAINE, COMMON 05/14/2009   Qualifier: Diagnosis of  By: Stephan Minister     MS (multiple sclerosis) ([place])  Sleep apnea    Past Surgical History:  Procedure Laterality Date   ABDOMINAL HYSTERECTOMY     APPENDECTOMY     BACK SURGERY  03/07/2012   Nerve Stimulator   CHOLECYSTECTOMY     COLONOSCOPY  07/09/2009   RMR; normal rectum aside from anal canal hemorrhoids/scattered left-sided diverticula   ESOPHAGOGASTRODUODENOSCOPY  05/22/2009   RMR; normal /small HH   ESOPHAGOGASTRODUODENOSCOPY  2007   RMR: non-critical Schatzki's rin, non-maniuplated    ESOPHAGOGASTRODUODENOSCOPY N/A 10/24/2013   Procedure: ESOPHAGOGASTRODUODENOSCOPY (EGD);  Surgeon: Corbin Adeobert M Rourk, MD;  Location: [facility];  Service: Endoscopy;  Laterality: N/A;  9:45   ESOPHAGOGASTRODUODENOSCOPY (EGD) WITH ESOPHAGEAL DILATION  03/17/2012   ZDG:[id number]:Abnormal appearing esophageal mucosa of uncertain significance. Status post Elease HashimotoMaloney dilation followed by esophageal bx   MALONEY DILATION N/A 10/24/2013   Procedure: Elease HashimotoMALONEY DILATION;  Surgeon: Corbin Adeobert M Rourk, MD;  Location: [facility];  Service: Endoscopy;  Laterality: N/A;   MANDIBLE SURGERY     NECK SURGERY     X2, last time 2013     Current Meds  Medication Sig   aspirin EC 81 MG tablet Take 81 mg by mouth daily.   B-D ULTRAFINE III SHORT PEN 31G X 8 MM MISC USE AS DIRECTED ONCE DAILY.   baclofen (LIORESAL) 20 MG tablet Take 20 mg by mouth 3 (three) times daily.    citalopram (CELEXA) 40 MG tablet Take 40 mg by mouth every evening.    Continuous Blood Gluc Receiver (DEXCOM G6 RECEIVER) DEVI 1 each by Does not apply route as directed.   Continuous Blood Gluc Sensor (DEXCOM G6 SENSOR) MISC 4 Pieces by Does not apply route once a week.   Continuous Blood Gluc Transmit (DEXCOM G6 TRANSMITTER) MISC AS DIRECTED TO CHECK BLOOD SUGAR   Dulaglutide (TRULICITY) 1.5 MG/0.5ML SOPN Inject 1.5 mg into the skin once a week.   gabapentin (NEURONTIN) 300 MG capsule Take 600 mg by mouth 4 (four) times daily.    glipiZIDE (GLUCOTROL XL) 5 MG 24 hr tablet Take 1 tablet (5 mg total) by mouth daily with breakfast.   glucose blood (ACCU-CHEK GUIDE) test strip 1 each by Other route 4 (four) times daily. Use as instructed 4 x daily. E11.65   insulin regular human CONCENTRATED (HUMULIN R U-500 KWIKPEN) 500 UNIT/ML kwikpen Inject 180 Units into the skin 3 (three) times daily with meals.   Lancets MISC 1 each by Does not apply route as directed.   lisinopril (PRINIVIL,ZESTRIL) 2.5 MG tablet Take 2.5 mg by mouth daily.   metFORMIN  (GLUCOPHAGE) 500 MG tablet Take 1,000 mg by mouth 2 (two) times daily.   pravastatin (PRAVACHOL) 20 MG tablet TAKE 1 TABLET BY MOUTH EVERY EVENING.     Allergies:   Peanut-containing drug products, Shellfish allergy, Gluten meal, Latex, Prednisone, Nexium [esomeprazole magnesium], and Tape   Social History   Tobacco Use   Smoking status: Never Smoker   Smokeless tobacco: Never Used  Substance Use Topics   Alcohol use: No   Drug use: No     Family Hx: The patient's family history includes Cancer in her mother; Diabetes in her father and sister. There is no history of Colon cancer.  ROS:   Please see the history of present illness.     All other systems reviewed and are negative.   Prior CV studies:   The following studies were reviewed today:  Echocardiogram: 07/2016 Study Conclusions  - Left ventricle: The cavity size was normal. Wall thickness  was   increased in a pattern of mild LVH. Systolic function was normal.   The estimated ejection fraction was in the range of 55% to 60%.   Wall motion was normal; there were no regional wall motion   abnormalities. Doppler parameters are consistent with abnormal   left ventricular relaxation (grade 1 diastolic dysfunction). - Aortic valve: Valve area (VTI): 3.3 cm^2. Valve area (Vmax): 2.73   cm^2. - Technically adequate study.  Echo Stress Test: 08/2016 Study Conclusions  - Stress ECG conclusions: The stress ECG was normal. - Staged echo: Resting left ventricular systolic function was   hyperdynamic, LVEF 65-70%. With stress, there was augmented   contractility of all wall segments, LVEF 85%. No evidence of   stress induced ischemia. Normal echo stress  Impressions:  - No evidence of stress induced ischemia. Exercise tolerance was   reduced. Clinical correlation indicated.   Labs/Other Tests and Data Reviewed:    EKG:  An ECG dated 11/04/2018 was personally reviewed today and demonstrated:  sinus tachycardia,  HR 102 with no acute ST changes.  Recent Labs: 11/01/2018: ALT 52; BUN 11; Creat 0.69; Potassium 4.3; Sodium 132   Recent Lipid Panel Lab Results  Component Value Date/Time   CHOL 200 (H) 04/28/2017 11:01 AM   TRIG 137 04/28/2017 11:01 AM   HDL 59 04/28/2017 11:01 AM   CHOLHDL 3.4 04/28/2017 11:01 AM   LDLCALC 116 (H) 04/28/2017 11:01 AM    Wt Readings from Last 3 Encounters:  11/30/18 194 lb (88 kg)  11/07/18 195 lb (88.5 kg)  11/04/18 194 lb (88 kg)     Objective:    Vital Signs:  BP 112/78    Pulse 100    Ht 5\' 5"  (1.651 m)    Wt 194 lb (88 kg)    BMI 32.28 kg/m    General: Pleasant female sounding in NAD Psych: Normal affect. Neuro: Alert and oriented X 3.  Lungs:  Resp regular and unlabored while talking on the phone.   ASSESSMENT & PLAN:    1. Palpitations/ Tachycardia - she has baseline sinus tachycardia which dates back for several years by review of notes but she reports worsening palpitations over the past few months. The official monitor report is pending but shows by preliminary review less than 1% PVCs and less than 1% supraventricular ectopic beats. Average heart rate was 99 with a range from 63 bpm to 145 bpm. She did have episodes of sustained tachycardia with the fastest rates being 123 bpm but no evidence of atrial fibrillation or atrial flutter.  - labs recently obtained by her PCP and showed normal K+ but glucose was elevated to 573. She has labs with her PCP next week and I recommended a TSH be obtained at that time. She has already reduced her caffeine and alcohol consumption.  - reviewed options and given her symptoms, she wishes to try medication to see if this will help. Will start Lopressor 12.5mg  BID to take for two weeks and then can titrate to 25mg  BID if palpitations remain present and BP allows for this adjustment.   2. HTN - BP well-controlled at 112/78 when checked at home. Continue Lisinopril 2.5mg  daily. I have asked her to follow BP at home  given the addition of Lopressor.   3. HLD - followed by PCP. Remains on Pravastatin 20mg  daily.   4. Uncontrolled IDDM - Hgb A1c elevated to > 14.0 when checked last month. Followed by Endocrinology.    COVID-19 Education:  The signs and symptoms of COVID-19 were discussed with the patient and how to seek care for testing (follow up with PCP or arrange E-visit).  The importance of social distancing was discussed today.  Time:   Today, I have spent 15 minutes with the patient with telehealth technology discussing the above problems.     Medication Adjustments/Labs and Tests Ordered: Current medicines are reviewed at length with the patient today.  Concerns regarding medicines are outlined above.   Tests Ordered: No orders of the defined types were placed in this encounter.   Medication Changes: Meds ordered this encounter  Medications   metoprolol tartrate (LOPRESSOR) 25 MG tablet    Sig: Take 12.5 mg Two Times Daily for 2 weeks then increase to 25 mg 2 Times Daily    Dispense:  180 tablet    Refill:  3    Follow Up:  In Person in 6 month(s)  Signed, Ellsworth LennoxBrittany M Raisha Brabender, PA-C  11/30/2018 8:40 PM    [facility]

## 2018-11-30 NOTE — Patient Instructions (Signed)
Medication Instructions:  Your physician has recommended you make the following change in your medication:  Start Lopressor 12.5 mg Two Times Daily for 2 Weeks then increase to 25 mg two Times Daily.   If you need a refill on your cardiac medications before your next appointment, please call your pharmacy.   Lab work: NONE   If you have labs (blood work) drawn today and your tests are completely normal, you will receive your results only by: Marland Kitchen MyChart Message (if you have MyChart) OR . A paper copy in the mail If you have any lab test that is abnormal or we need to change your treatment, we will call you to review the results.  Testing/Procedures: NONE   Follow-Up: At [facility], you and your health needs are our priority.  As part of our continuing mission to provide you with exceptional heart care, we have created designated Provider Care Teams.  These Care Teams include your primary Cardiologist (physician) and Advanced Practice Providers (APPs -  Physician Assistants and Nurse Practitioners) who all work together to provide you with the care you need, when you need it. You will need a follow up appointment in 6 months.  Please call our office 2 months in advance to schedule this appointment.  You may see Carlyle Dolly, MD or one of the following Advanced Practice Providers on your designated Care Team:   Bernerd Pho, PA-C [facility]) . Ermalinda Barrios, PA-C ([place])  Any Other Special Instructions Will Be Listed Below (If Applicable). Thank you for choosing [facility]!

## 2018-12-02 ENCOUNTER — Encounter: Payer: Self-pay | Admitting: "Endocrinology

## 2018-12-02 ENCOUNTER — Other Ambulatory Visit: Payer: Self-pay

## 2018-12-02 ENCOUNTER — Ambulatory Visit (INDEPENDENT_AMBULATORY_CARE_PROVIDER_SITE_OTHER): Payer: BC Managed Care – PPO | Admitting: "Endocrinology

## 2018-12-02 DIAGNOSIS — I10 Essential (primary) hypertension: Secondary | ICD-10-CM

## 2018-12-02 DIAGNOSIS — E1165 Type 2 diabetes mellitus with hyperglycemia: Secondary | ICD-10-CM | POA: Diagnosis not present

## 2018-12-02 DIAGNOSIS — E782 Mixed hyperlipidemia: Secondary | ICD-10-CM | POA: Diagnosis not present

## 2018-12-02 MED ORDER — DEXCOM G6 TRANSMITTER MISC: 1.0000 | 0 refills | Status: DC

## 2018-12-02 MED ORDER — GLIPIZIDE ER 10 MG PO TB24: 10.0000 mg | ORAL_TABLET | Freq: Every day | ORAL | 3 refills | Status: DC

## 2018-12-02 NOTE — Progress Notes (Signed)
12/02/2018, 1:57 PM                                                      Endocrinology Telehealth Visit Follow up Note -During COVID -19 Pandemic  This visit type was conducted due to national recommendations for restrictions regarding the COVID-19 Pandemic  in an effort to limit this patient's exposure and mitigate transmission of the corona virus.  Due to her co-morbid illnesses, [name] is at  moderate to high risk for complications without adequate follow up.  This format is felt to be most appropriate for her at this time.  I connected with this patient on 12/02/2018   by telephone and verified that I am speaking with the correct person using two identifiers. [name], 1965/08/06. she has verbally consented to this visit. All issues noted in this document were discussed and addressed. The format was not optimal for physical exam.    Subjective:    Patient ID: [name], female    DOB: 12/27/1965.  [name] is engaged in telehealth via telephone for follow-up for the management of currently uncontrolled symptomatic 2 diabetes, hyperlipidemia, hypertension. PMD:   Elfredia Nevins, MD.   Past Medical History:  Diagnosis Date  . Asthma   . Chronic abdominal pain   . Chronic neck pain    C4-6 fusion  . CONSTIPATION 05/16/2009   Qualifier: Diagnosis of  By: Yetta Barre FNP-BC, Kandice L   . Diabetes mellitus without complication (HCC)   . Dysphagia 02/23/2012  . Fatty liver   . GERD (gastroesophageal reflux disease)   . Helicobacter pylori gastritis 2011  . Hyperlipidemia   . Hypertension   . LIVER FUNCTION TESTS, ABNORMAL, HX OF 05/14/2009   Qualifier: Diagnosis of  By: Ricard Dillon    . Migraine headache   . MIGRAINE, COMMON 05/14/2009   Qualifier: Diagnosis of  By: Ricard Dillon    . MS (multiple sclerosis) (HCC)   . Sleep apnea    Past Surgical  History:  Procedure Laterality Date  . ABDOMINAL HYSTERECTOMY    . APPENDECTOMY    . BACK SURGERY  03/07/2012   Nerve Stimulator  . CHOLECYSTECTOMY    . COLONOSCOPY  07/09/2009   RMR; normal rectum aside from anal canal hemorrhoids/scattered left-sided diverticula  . ESOPHAGOGASTRODUODENOSCOPY  05/22/2009   RMR; normal /small HH  . ESOPHAGOGASTRODUODENOSCOPY  2007   RMR: non-critical Schatzki's rin, non-maniuplated  . ESOPHAGOGASTRODUODENOSCOPY N/A 10/24/2013   Procedure: ESOPHAGOGASTRODUODENOSCOPY (EGD);  Surgeon: Corbin Ade, MD;  Location: [facility];  Service: Endoscopy;  Laterality: N/A;  9:45  . ESOPHAGOGASTRODUODENOSCOPY (EGD) WITH ESOPHAGEAL DILATION  03/17/2012   RUE:[id number] appearing esophageal mucosa of uncertain significance. Status post [name]  dilation followed by esophageal bx  . MALONEY DILATION N/A 10/24/2013   Procedure: Venia Minks DILATION;  Surgeon: Daneil Dolin, MD;  Location: [facility];  Service: Endoscopy;  Laterality: N/A;  . MANDIBLE SURGERY    . NECK SURGERY     X2, last time 2013   Social History   Socioeconomic History  . Marital status: Married    Spouse name: Not on file  . Number of children: Not on file  . Years of education: Not on file  . Highest education level: Not on file  Occupational History  . Not on file  Social Needs  . Financial resource strain: Not on file  . Food insecurity    Worry: Not on file    Inability: Not on file  . Transportation needs    Medical: Not on file    Non-medical: Not on file  Tobacco Use  . Smoking status: Never Smoker  . Smokeless tobacco: Never Used  Substance and Sexual Activity  . Alcohol use: No  . Drug use: No  . Sexual activity: Yes    Birth control/protection: Surgical  Lifestyle  . Physical activity    Days per week: Not on file    Minutes per session: Not on file  . Stress: Not on file  Relationships  . Social Herbalist on phone: Not on file    Gets together: Not on  file    Attends religious service: Not on file    Active member of club or organization: Not on file    Attends meetings of clubs or organizations: Not on file    Relationship status: Not on file  Other Topics Concern  . Not on file  Social History Narrative   5 kids, raising grandchild   Outpatient Encounter Medications as of 12/02/2018  Medication Sig  . aspirin EC 81 MG tablet Take 81 mg by mouth daily.  . B-D ULTRAFINE III SHORT PEN 31G X 8 MM MISC USE AS DIRECTED ONCE DAILY.  . baclofen (LIORESAL) 20 MG tablet Take 20 mg by mouth 3 (three) times daily.   . citalopram (CELEXA) 40 MG tablet Take 40 mg by mouth every evening.   . Continuous Blood Gluc Receiver ([place]) [place] 1 each by Does not apply route as directed.  . Continuous Blood Gluc Sensor (DEXCOM G6 SENSOR) MISC 4 Pieces by Does not apply route once a week.  . Continuous Blood Gluc Transmit (DEXCOM G6 TRANSMITTER) MISC 1 each by Combination route as directed.  . Dulaglutide (TRULICITY) 1.5 VZ/5.6LO SOPN Inject 1.5 mg into the skin once a week.  . gabapentin (NEURONTIN) 300 MG capsule Take 600 mg by mouth 4 (four) times daily.   Marland Kitchen glipiZIDE (GLUCOTROL XL) 10 MG 24 hr tablet Take 1 tablet (10 mg total) by mouth daily with breakfast.  . glucose blood (ACCU-CHEK GUIDE) test strip 1 each by Other route 4 (four) times daily. Use as instructed 4 x daily. E11.65  . insulin regular human CONCENTRATED (HUMULIN R U-500 KWIKPEN) 500 UNIT/ML kwikpen Inject 180 Units into the skin 3 (three) times daily with meals.  . Lancets MISC 1 each by Does not apply route as directed.  Marland Kitchen lisinopril (PRINIVIL,ZESTRIL) 2.5 MG tablet Take 2.5 mg by mouth daily.  . metFORMIN (GLUCOPHAGE) 500 MG tablet Take 1,000 mg by mouth 2 (two) times daily.  . metoprolol tartrate (LOPRESSOR) 25 MG tablet Take 12.5 mg Two Times Daily for 2 weeks then increase to  25 mg 2 Times Daily  . pravastatin (PRAVACHOL) 20 MG tablet TAKE 1 TABLET BY MOUTH EVERY EVENING.   . [DISCONTINUED] Continuous Blood Gluc Transmit (DEXCOM G6 TRANSMITTER) MISC AS DIRECTED TO CHECK BLOOD SUGAR  . [DISCONTINUED] glipiZIDE (GLUCOTROL XL) 5 MG 24 hr tablet Take 1 tablet (5 mg total) by mouth daily with breakfast.   No facility-administered encounter medications on file as of 12/02/2018.     ALLERGIES: Allergies  Allergen Reactions  . Peanut-Containing Drug Products Anaphylaxis and Hives    Patient states she is allergic to all nuts  . Shellfish Allergy Anaphylaxis  . Gluten Meal   . Latex Other (See Comments)    Reaction: blistering  . Prednisone Hives and Other (See Comments)    Reaction: causes psychological issues   . Nexium [Esomeprazole Magnesium] Hives  . Tape Rash    Paper tape please    VACCINATION STATUS: Immunization History  Administered Date(s) Administered  . Influenza Split 04/17/2012  . Pneumococcal Polysaccharide-23 04/17/2012    Diabetes She presents for her follow-up diabetic visit. She has type 2 diabetes mellitus. Onset time: She was diagnosed at approximate age of 48 years. Her disease course has been worsening. There are no hypoglycemic associated symptoms. Pertinent negatives for hypoglycemia include no confusion, pallor or seizures. Associated symptoms include fatigue, polydipsia and polyuria. Pertinent negatives for diabetes include no polyphagia. There are no hypoglycemic complications. Symptoms are improving. There are no diabetic complications. Risk factors for coronary artery disease include diabetes mellitus, hypertension, obesity, sedentary lifestyle and dyslipidemia. Current diabetic treatment includes insulin injections. She is compliant with treatment most of the time. Her weight is fluctuating minimally. She is following a generally healthy (she admits to consumption of large quantities of soda.) diet. When asked about meal planning, she reported none. She has not had a previous visit with a dietitian. She never participates in  exercise. Her home blood glucose trend is increasing steadily. Her overall blood glucose range is >200 mg/dl. (Despite high-dose insulin, she still has significantly above target average blood glucose  Profile.  Presenting with fasting blood glucose ranging between 90-260, prelunch between 300-400, presupper between 90-300, bedtime 336-400) An ACE inhibitor/angiotensin II receptor blocker is being taken.  Hyperlipidemia This is a chronic problem. The current episode started more than 1 year ago. The problem is uncontrolled. Exacerbating diseases include diabetes and obesity. Pertinent negatives include no myalgias. Current antihyperlipidemic treatment includes statins. Risk factors for coronary artery disease include dyslipidemia, diabetes mellitus, hypertension, obesity and a sedentary lifestyle.    Objective:    There were no vitals taken for this visit.  Wt Readings from Last 3 Encounters:  11/30/18 194 lb (88 kg)  11/07/18 195 lb (88.5 kg)  11/04/18 194 lb (88 kg)      Recent Results (from the past 2160 hour(s))  COMPLETE METABOLIC PANEL WITH GFR     Status: Abnormal   Collection Time: 11/01/18 12:00 PM  Result Value Ref Range   Glucose, Bld 573 (HH) 65 - 139 mg/dL    Comment: Verified by repeat analysis. . .        Non-fasting reference interval .    BUN 11 7 - 25 mg/dL   Creat 1.610.69 0.960.50 - 0.451.05 mg/dL    Comment: For patients >53 years of age, the reference limit for Creatinine is approximately 13% higher for people identified as African-American. .    GFR, Est Non African American 99 > OR = 60 mL/min/1.5373m2   GFR, Est African  American 115 > OR = 60 mL/min/1.1773m2   BUN/Creatinine Ratio NOT APPLICABLE 6 - 22 (calc)   Sodium 132 (L) 135 - 146 mmol/L   Potassium 4.3 3.5 - 5.3 mmol/L   Chloride 95 (L) 98 - 110 mmol/L   CO2 26 20 - 32 mmol/L   Calcium 8.6 8.6 - 10.4 mg/dL   Total Protein 7.4 6.1 - 8.1 g/dL   Albumin 3.6 3.6 - 5.1 g/dL   Globulin 3.8 (H) 1.9 - 3.7 g/dL (calc)    AG Ratio 0.9 (L) 1.0 - 2.5 (calc)   Total Bilirubin 0.4 0.2 - 1.2 mg/dL   Alkaline phosphatase (APISO) 113 37 - 153 U/L   AST 52 (H) 10 - 35 U/L   ALT 52 (H) 6 - 29 U/L  Hemoglobin A1c     Status: Abnormal   Collection Time: 11/01/18 12:00 PM  Result Value Ref Range   Hgb A1c MFr Bld >14.0 (H) <5.7 % of total Hgb    Comment: Verified by repeat analysis. . For someone without known diabetes, a hemoglobin A1c value of 6.5% or greater indicates that they may have  diabetes and this should be confirmed with a follow-up  test. . For someone with known diabetes, a value <7% indicates  that their diabetes is well controlled and a value  greater than or equal to 7% indicates suboptimal  control. A1c targets should be individualized based on  duration of diabetes, age, comorbid conditions, and  other considerations. . Currently, no consensus exists regarding use of hemoglobin A1c for diagnosis of diabetes for children. .    Mean Plasma Glucose  (calc)    Comment: eAG cannot be calculated. Hemoglobin A1c result exceeds the linearity of the assay.   POCT glucose (manual entry)     Status: Abnormal   Collection Time: 11/07/18  3:18 PM  Result Value Ref Range   POC Glucose 515 (A) 70 - 99 mg/dl    Comment: 1.6XWR3.5hrs PC    Diabetic Labs (most recent): Lab Results  Component Value Date   HGBA1C >14.0 (H) 11/01/2018   HGBA1C 13.9 (H) 07/05/2018   HGBA1C 13.7 (H) 04/01/2018     Lipid Panel ( most recent) Lipid Panel     Component Value Date/Time   CHOL 200 (H) 04/28/2017 1101   TRIG 137 04/28/2017 1101   HDL 59 04/28/2017 1101   CHOLHDL 3.4 04/28/2017 1101   VLDL 28 04/17/2012 0644   LDLCALC 116 (H) 04/28/2017 1101      Lab Results  Component Value Date   TSH 0.31 (L) 04/28/2017   TSH 0.667 04/16/2012   FREET4 1.1 04/28/2017       Assessment & Plan:   1. Uncontrolled type 2 diabetes mellitus with hyperglycemia (HCC)  - [name] has currently uncontrolled  symptomatic type 2 DM since  53 years of age. -This patient continues to have persistent hyperglycemia despite high-dose of  large dose of insulin utilizing insulin U500.   -Her previsit labs show A1c of  > 14%. -her diabetes is complicated by obesity/ sedentary life and [name] remains at extremely high risk for more acute and chronic complications which include CAD, CVA, CKD, retinopathy, and neuropathy. These are all discussed in detail with the patient.  - I have counseled her on diet management and weight loss, by adopting a carbohydrate restricted/protein rich diet.  She admits to continued consumption of large quantities of soda.  - she  admits there is a room  for improvement in her diet and drink choices. -  Suggestion is made for her to avoid simple carbohydrates  from her diet including Cakes, Sweet Desserts / Pastries, Ice Cream, Soda (diet and regular), Sweet Tea, Candies, Chips, Cookies, Sweet Pastries,  Store Bought Juices, Alcohol in Excess of  1-2 drinks a day, Artificial Sweeteners, Coffee Creamer, and "Sugar-free" Products. This will help patient to have stable blood glucose profile and potentially avoid unintended weight gain.  - I encouraged her to switch to  unprocessed or minimally processed complex starch and increased protein intake (animal or plant source), fruits, and vegetables.  - she is advised to stick to a routine mealtimes to eat 3 meals  a day and avoid unnecessary snacks ( to snack only to correct hypoglycemia).   - I have approached her with the following individualized plan to manage diabetes and patient agrees:    - Based on her consistently above target glycemic profile, she will continue to require higher dose of insulin.    -She is advised to continue  her Humulin U500  180  units 3 times daily AC, 20 minutes before meals, associated with strict monitoring of blood glucose 4 times a day minimum.  She wears the freestyle libre device for CGM, encouraged  to use it properly.  -She is advised not to use any other kind of insulin while taking insulin U500.    -Once again, she is encouraged to call clinic for blood glucose levels less than 70 or above 300 mg /dl. -She is advised to continue metformin 1000 mg p.o. twice daily, therapeutically suitable for patient . -She also advised to continue Trulicity 1.5 mg subcutaneously weekly.    Side effects and precautions discussed with her. -She is advised to increase her glipizide to 10 mg XL p.o. daily at breakfast.     2) Lipids/HPL: Her last lipid panel showed improving LDL of 116, improving from 141.  She is advised to continue atorvastatin 20 mg p.o. nightly.           Side effects and precautions discussed with her.  -She will be considered for high intensity statin, atorvastatin 80 mg nightly on subsequent visits.  3) hypertension:  she is advised to home monitor blood pressure and report if > 140/90 on 2 separate readings. She is advised to continue lisinopril 2.5 mg daily.  - I advised patient to maintain close follow up with Elfredia NevinsFusco, Lawrence, MD for primary care needs.  - Patient Care Time Today:  25 min, of which >50% was spent in  counseling and the rest reviewing her  current and  previous labs/studies, previous treatments, her blood glucose readings, and medications' doses and developing a plan for long-term care based on the latest recommendations for standards of care.   [name] participated in the discussions, expressed understanding, and voiced agreement with the above plans.  All questions were answered to her satisfaction. she is encouraged to contact clinic should she have any questions or concerns prior to her return visit.   Follow up plan: - Return in about 2 weeks (around 12/16/2018) for Follow up with [facility] Only - no Labs.  Marquis LunchGebre Leny Morozov, MD [facility] [address] [place], [place] [address] Phone:  [phone]  Fax: [phone]    12/02/2018, 1:57 PM  This note was partially dictated with voice recognition software. Similar sounding words can be transcribed inadequately or may not  be corrected upon review.

## 2018-12-20 ENCOUNTER — Ambulatory Visit (INDEPENDENT_AMBULATORY_CARE_PROVIDER_SITE_OTHER): Payer: BC Managed Care – PPO | Admitting: "Endocrinology

## 2018-12-20 ENCOUNTER — Other Ambulatory Visit: Payer: Self-pay

## 2018-12-20 ENCOUNTER — Encounter: Payer: Self-pay | Admitting: "Endocrinology

## 2018-12-20 DIAGNOSIS — I10 Essential (primary) hypertension: Secondary | ICD-10-CM

## 2018-12-20 DIAGNOSIS — E782 Mixed hyperlipidemia: Secondary | ICD-10-CM

## 2018-12-20 DIAGNOSIS — E1165 Type 2 diabetes mellitus with hyperglycemia: Secondary | ICD-10-CM

## 2018-12-20 MED ORDER — BD PEN NEEDLE SHORT U/F 31G X 8 MM MISC: 2 refills | Status: DC

## 2018-12-20 NOTE — Progress Notes (Signed)
12/20/2018, 6:24 PM                                                      Endocrinology Telehealth Visit Follow up Note -During COVID -19 Pandemic  This visit type was conducted due to national recommendations for restrictions regarding the COVID-19 Pandemic  in an effort to limit this patient's exposure and mitigate transmission of the corona virus.  Due to her co-morbid illnesses, [name] is at  moderate to high risk for complications without adequate follow up.  This format is felt to be most appropriate for her at this time.  I connected with this patient on 12/20/2018   by telephone and verified that I am speaking with the correct person using two identifiers. [name], 12/03/1965. she has verbally consented to this visit. All issues noted in this document were discussed and addressed. The format was not optimal for physical exam.    Subjective:    Patient ID: [name], female    DOB: 12/03/1965.  [name] is engaged in telehealth via telephone for follow-up for the management of currently uncontrolled symptomatic 2 diabetes, hyperlipidemia, hypertension. PMD:   Elfredia NevinsFusco, [place], MD.   Past Medical History:  Diagnosis Date  . Asthma   . Chronic abdominal pain   . Chronic neck pain    C4-6 fusion  . CONSTIPATION 05/16/2009   Qualifier: Diagnosis of  By: Yetta BarreJones FNP-BC, Kandice L   . Diabetes mellitus without complication (HCC)   . Dysphagia 02/23/2012  . Fatty liver   . GERD (gastroesophageal reflux disease)   . Helicobacter pylori gastritis 2011  . Hyperlipidemia   . Hypertension   . LIVER FUNCTION TESTS, ABNORMAL, HX OF 05/14/2009   Qualifier: Diagnosis of  By: Ricard DillonUrshel, [place]    . Migraine headache   . MIGRAINE, COMMON 05/14/2009   Qualifier: Diagnosis of  By: Ricard DillonUrshel, [place]    . MS (multiple sclerosis) (HCC)   . Sleep apnea    Past Surgical  History:  Procedure Laterality Date  . ABDOMINAL HYSTERECTOMY    . APPENDECTOMY    . BACK SURGERY  03/07/2012   Nerve Stimulator  . CHOLECYSTECTOMY    . COLONOSCOPY  07/09/2009   RMR; normal rectum aside from anal canal hemorrhoids/scattered left-sided diverticula  . ESOPHAGOGASTRODUODENOSCOPY  05/22/2009   RMR; normal /small HH  . ESOPHAGOGASTRODUODENOSCOPY  2007   RMR: non-critical Schatzki's rin, non-maniuplated  . ESOPHAGOGASTRODUODENOSCOPY N/A 10/24/2013   Procedure: ESOPHAGOGASTRODUODENOSCOPY (EGD);  Surgeon: Corbin Adeobert M Rourk, MD;  Location: [facility];  Service: Endoscopy;  Laterality: N/A;  9:45  . ESOPHAGOGASTRODUODENOSCOPY (EGD) WITH ESOPHAGEAL DILATION  03/17/2012   ZOX:[id number]:Abnormal appearing esophageal mucosa of uncertain significance. Status post Elease HashimotoMaloney  dilation followed by esophageal bx  . MALONEY DILATION N/A 10/24/2013   Procedure: [name] DILATION;  Surgeon: Corbin Ade, MD;  Location: [facility];  Service: Endoscopy;  Laterality: N/A;  . MANDIBLE SURGERY    . NECK SURGERY     X2, last time 2013   Social History   Socioeconomic History  . Marital status: Married    Spouse name: Not on file  . Number of children: Not on file  . Years of education: Not on file  . Highest education level: Not on file  Occupational History  . Not on file  Social Needs  . Financial resource strain: Not on file  . Food insecurity    Worry: Not on file    Inability: Not on file  . Transportation needs    Medical: Not on file    Non-medical: Not on file  Tobacco Use  . Smoking status: Never Smoker  . Smokeless tobacco: Never Used  Substance and Sexual Activity  . Alcohol use: No  . Drug use: No  . Sexual activity: Yes    Birth control/protection: Surgical  Lifestyle  . Physical activity    Days per week: Not on file    Minutes per session: Not on file  . Stress: Not on file  Relationships  . Social Musician on phone: Not on file    Gets together: Not on  file    Attends religious service: Not on file    Active member of club or organization: Not on file    Attends meetings of clubs or organizations: Not on file    Relationship status: Not on file  Other Topics Concern  . Not on file  Social History Narrative   5 kids, raising grandchild   Outpatient Encounter Medications as of 12/20/2018  Medication Sig  . aspirin EC 81 MG tablet Take 81 mg by mouth daily.  . baclofen (LIORESAL) 20 MG tablet Take 20 mg by mouth 3 (three) times daily.   . citalopram (CELEXA) 40 MG tablet Take 40 mg by mouth every evening.   . Continuous Blood Gluc Receiver (DEXCOM G6 RECEIVER) DEVI 1 each by Does not apply route as directed.  . Continuous Blood Gluc Sensor (DEXCOM G6 SENSOR) MISC 4 Pieces by Does not apply route once a week.  . Continuous Blood Gluc Transmit (DEXCOM G6 TRANSMITTER) MISC 1 each by Combination route as directed.  . Dulaglutide (TRULICITY) 1.5 MG/0.5ML SOPN Inject 1.5 mg into the skin once a week.  . gabapentin (NEURONTIN) 300 MG capsule Take 600 mg by mouth 4 (four) times daily.   Marland Kitchen glipiZIDE (GLUCOTROL XL) 10 MG 24 hr tablet Take 1 tablet (10 mg total) by mouth daily with breakfast.  . glucose blood (ACCU-CHEK GUIDE) test strip 1 each by Other route 4 (four) times daily. Use as instructed 4 x daily. E11.65  . Insulin Pen Needle (B-D ULTRAFINE III SHORT PEN) 31G X 8 MM MISC USE AS DIRECTED ONCE DAILY.  Marland Kitchen insulin regular human CONCENTRATED (HUMULIN R U-500 KWIKPEN) 500 UNIT/ML kwikpen Inject 180 Units into the skin 3 (three) times daily with meals.  . Lancets MISC 1 each by Does not apply route as directed.  Marland Kitchen lisinopril (PRINIVIL,ZESTRIL) 2.5 MG tablet Take 2.5 mg by mouth daily.  . metFORMIN (GLUCOPHAGE) 500 MG tablet Take 1,000 mg by mouth 2 (two) times daily.  . metoprolol tartrate (LOPRESSOR) 25 MG tablet Take 12.5 mg Two Times Daily for 2 weeks  then increase to 25 mg 2 Times Daily  . pravastatin (PRAVACHOL) 20 MG tablet TAKE 1 TABLET BY  MOUTH EVERY EVENING.  . [DISCONTINUED] B-D ULTRAFINE III SHORT PEN 31G X 8 MM MISC USE AS DIRECTED ONCE DAILY.   No facility-administered encounter medications on file as of 12/20/2018.     ALLERGIES: Allergies  Allergen Reactions  . Peanut-Containing Drug Products Anaphylaxis and Hives    Patient states she is allergic to all nuts  . Shellfish Allergy Anaphylaxis  . Gluten Meal   . Latex Other (See Comments)    Reaction: blistering  . Prednisone Hives and Other (See Comments)    Reaction: causes psychological issues   . Nexium [Esomeprazole Magnesium] Hives  . Tape Rash    Paper tape please    VACCINATION STATUS: Immunization History  Administered Date(s) Administered  . Influenza Split 04/17/2012  . Pneumococcal Polysaccharide-23 04/17/2012    Diabetes She presents for her follow-up diabetic visit. She has type 2 diabetes mellitus. Onset time: She was diagnosed at approximate age of 54 years. Her disease course has been worsening. There are no hypoglycemic associated symptoms. Pertinent negatives for hypoglycemia include no confusion, pallor or seizures. Associated symptoms include fatigue, polydipsia and polyuria. Pertinent negatives for diabetes include no polyphagia. There are no hypoglycemic complications. Symptoms are worsening. There are no diabetic complications. Risk factors for coronary artery disease include diabetes mellitus, hypertension, obesity, sedentary lifestyle and dyslipidemia. Current diabetic treatment includes insulin injections. She is compliant with treatment most of the time. She is following a generally healthy (she admits to consumption of large quantities of soda.) diet. When asked about meal planning, she reported none. She has not had a previous visit with a dietitian. She never participates in exercise. Her home blood glucose trend is decreasing steadily. (Despite high-dose insulin, she still has significantly above target average blood glucose  Profile.   Presenting with fasting blood glucose ranging between 90-330, prelunch between 200-400, presupper between 90-370, bedtime 286-400) An ACE inhibitor/angiotensin II receptor blocker is being taken.  Hyperlipidemia This is a chronic problem. The current episode started more than 1 year ago. The problem is uncontrolled. Exacerbating diseases include diabetes and obesity. Pertinent negatives include no myalgias. Current antihyperlipidemic treatment includes statins. Risk factors for coronary artery disease include dyslipidemia, diabetes mellitus, hypertension, obesity and a sedentary lifestyle.    Objective:    There were no vitals taken for this visit.  Wt Readings from Last 3 Encounters:  11/30/18 194 lb (88 kg)  11/07/18 195 lb (88.5 kg)  11/04/18 194 lb (88 kg)      Recent Results (from the past 2160 hour(s))  COMPLETE METABOLIC PANEL WITH GFR     Status: Abnormal   Collection Time: 11/01/18 12:00 PM  Result Value Ref Range   Glucose, Bld 573 (HH) 65 - 139 mg/dL    Comment: Verified by repeat analysis. . .        Non-fasting reference interval .    BUN 11 7 - 25 mg/dL   Creat 0.69 0.50 - 1.05 mg/dL    Comment: For patients >14 years of age, the reference limit for Creatinine is approximately 13% higher for people identified as African-American. .    GFR, Est Non African American 99 > OR = 60 mL/min/1.80m2   GFR, Est African American 115 > OR = 60 mL/min/1.4m2   BUN/Creatinine Ratio NOT APPLICABLE 6 - 22 (calc)   Sodium 132 (L) 135 - 146 mmol/L   Potassium 4.3 3.5 -  5.3 mmol/L   Chloride 95 (L) 98 - 110 mmol/L   CO2 26 20 - 32 mmol/L   Calcium 8.6 8.6 - 10.4 mg/dL   Total Protein 7.4 6.1 - 8.1 g/dL   Albumin 3.6 3.6 - 5.1 g/dL   Globulin 3.8 (H) 1.9 - 3.7 g/dL (calc)   AG Ratio 0.9 (L) 1.0 - 2.5 (calc)   Total Bilirubin 0.4 0.2 - 1.2 mg/dL   Alkaline phosphatase (APISO) 113 37 - 153 U/L   AST 52 (H) 10 - 35 U/L   ALT 52 (H) 6 - 29 U/L  Hemoglobin A1c     Status:  Abnormal   Collection Time: 11/01/18 12:00 PM  Result Value Ref Range   Hgb A1c MFr Bld >14.0 (H) <5.7 % of total Hgb    Comment: Verified by repeat analysis. . For someone without known diabetes, a hemoglobin A1c value of 6.5% or greater indicates that they may have  diabetes and this should be confirmed with a follow-up  test. . For someone with known diabetes, a value <7% indicates  that their diabetes is well controlled and a value  greater than or equal to 7% indicates suboptimal  control. A1c targets should be individualized based on  duration of diabetes, age, comorbid conditions, and  other considerations. . Currently, no consensus exists regarding use of hemoglobin A1c for diagnosis of diabetes for children. .    Mean Plasma Glucose  (calc)    Comment: eAG cannot be calculated. Hemoglobin A1c result exceeds the linearity of the assay.   POCT glucose (manual entry)     Status: Abnormal   Collection Time: 11/07/18  3:18 PM  Result Value Ref Range   POC Glucose 515 (A) 70 - 99 mg/dl    Comment: 9.6EAV3.5hrs PC    Diabetic Labs (most recent): Lab Results  Component Value Date   HGBA1C >14.0 (H) 11/01/2018   HGBA1C 13.9 (H) 07/05/2018   HGBA1C 13.7 (H) 04/01/2018     Lipid Panel ( most recent) Lipid Panel     Component Value Date/Time   CHOL 200 (H) 04/28/2017 1101   TRIG 137 04/28/2017 1101   HDL 59 04/28/2017 1101   CHOLHDL 3.4 04/28/2017 1101   VLDL 28 04/17/2012 0644   LDLCALC 116 (H) 04/28/2017 1101      Lab Results  Component Value Date   TSH 0.31 (L) 04/28/2017   TSH 0.667 04/16/2012   FREET4 1.1 04/28/2017       Assessment & Plan:   1. Uncontrolled type 2 diabetes mellitus with hyperglycemia (HCC)  - Shahed C Ernest Odom has currently uncontrolled symptomatic type 2 DM since  53 years of age. -This patient continues to have persistent hyperglycemia despite high-dose of  large dose of insulin utilizing insulin U500.   -Her previsit labs show A1c of  >  14%. -her diabetes is complicated by obesity/ sedentary life and Cloie C Ernest Odom remains at extremely high risk for more acute and chronic complications which include CAD, CVA, CKD, retinopathy, and neuropathy. These are all discussed in detail with the patient.  - I have counseled her on diet management and weight loss, by adopting a carbohydrate restricted/protein rich diet.  She admits to continued consumption of large quantities of soda. - she  admits there is a room for improvement in her diet and drink choices.  She continues to consume large quantities of sweetened beverages. -  Suggestion is made for her to avoid simple carbohydrates  from her diet  including Cakes, Sweet Desserts / Pastries, Ice Cream, Soda (diet and regular), Sweet Tea, Candies, Chips, Cookies, Sweet Pastries,  Store Bought Juices, Alcohol in Excess of  1-2 drinks a day, Artificial Sweeteners, Coffee Creamer, and "Sugar-free" Products. This will help patient to have stable blood glucose profile and potentially avoid unintended weight gain.   - I encouraged her to switch to  unprocessed or minimally processed complex starch and increased protein intake (animal or plant source), fruits, and vegetables.  - she is advised to stick to a routine mealtimes to eat 3 meals  a day and avoid unnecessary snacks ( to snack only to correct hypoglycemia).   - I have approached her with the following individualized plan to manage diabetes and patient agrees:    - Based on her consistently above target glycemic profile, she will continue to require higher dose of insulin.    -She is advised to continue  her Humulin U500 180 units 3 times a day  20 minutes before her 3  major meals, associated with strict monitoring of blood glucose 4 times a day minimum.  She wears the freestyle libre device for CGM, encouraged to use it properly.  -She is advised not to use any other kind of insulin while taking insulin U500.    -Once again, she is  encouraged to call clinic for blood glucose levels less than 70 or above 300 mg /dl. -She is advised to continue metformin 1000 mg p.o. twice daily , therapeutically suitable for patient . -She also advised to continue Trulicity  1.5 mg subcutaneously weekly.    Side effects and precautions discussed with her. -She is advised to continue glipizide  10 mg XL p.o. daily at breakfast.     2) Lipids/HPL: Her last lipid panel showed improving LDL of 116, improving from 141.  She is advised to continue atorvastatin 20 mg p.o. nightly.          Side effects and precautions discussed with her.    3) hypertension:  she is advised to home monitor blood pressure and report if > 140/90 on 2 separate readings. She is advised to continue lisinopril 2.5 mg daily.  - I advised patient to maintain close follow up with Elfredia Nevins, MD for primary care needs.  - Patient Care Time Today:  25 min, of which >50% was spent in  counseling and the rest reviewing her  current and  previous labs/studies, previous treatments, her blood glucose readings, and medications' doses and developing a plan for long-term care based on the latest recommendations for standards of care.   Jadia Rada Hay participated in the discussions, expressed understanding, and voiced agreement with the above plans.  All questions were answered to her satisfaction. she is encouraged to contact clinic should she have any questions or concerns prior to her return visit.   Follow up plan: - Return in about 3 months (around 03/21/2019) for Bring Meter and Logs- A1c in Office, Include 8 log sheets.  Marquis Lunch, MD [facility] [address] [place], [place] [address] Phone: [phone]  Fax: [phone]    12/20/2018, 6:24 PM  This note was partially dictated with voice recognition software. Similar sounding words can be transcribed inadequately or may not  be corrected upon review.

## 2019-01-18 ENCOUNTER — Emergency Department (HOSPITAL_COMMUNITY): Payer: BC Managed Care – PPO

## 2019-01-18 ENCOUNTER — Other Ambulatory Visit: Payer: Self-pay

## 2019-01-18 ENCOUNTER — Emergency Department (HOSPITAL_COMMUNITY)
Admission: EM | Admit: 2019-01-18 | Discharge: 2019-01-18 | Disposition: A | Discharge status: Home / Self Care | Payer: BC Managed Care – PPO | Attending: Emergency Medicine | Admitting: Emergency Medicine

## 2019-01-18 ENCOUNTER — Encounter (HOSPITAL_COMMUNITY): Payer: Self-pay | Admitting: Emergency Medicine

## 2019-01-18 DIAGNOSIS — Z91040 Latex allergy status: Secondary | ICD-10-CM | POA: Diagnosis not present

## 2019-01-18 DIAGNOSIS — G43909 Migraine, unspecified, not intractable, without status migrainosus: Secondary | ICD-10-CM | POA: Diagnosis not present

## 2019-01-18 DIAGNOSIS — M7918 Myalgia, other site: Secondary | ICD-10-CM

## 2019-01-18 DIAGNOSIS — Z79899 Other long term (current) drug therapy: Secondary | ICD-10-CM | POA: Diagnosis not present

## 2019-01-18 DIAGNOSIS — I10 Essential (primary) hypertension: Secondary | ICD-10-CM | POA: Diagnosis not present

## 2019-01-18 DIAGNOSIS — E1165 Type 2 diabetes mellitus with hyperglycemia: Secondary | ICD-10-CM | POA: Insufficient documentation

## 2019-01-18 DIAGNOSIS — R079 Chest pain, unspecified: Secondary | ICD-10-CM | POA: Insufficient documentation

## 2019-01-18 DIAGNOSIS — Z794 Long term (current) use of insulin: Secondary | ICD-10-CM | POA: Diagnosis not present

## 2019-01-18 DIAGNOSIS — R739 Hyperglycemia, unspecified: Secondary | ICD-10-CM

## 2019-01-18 DIAGNOSIS — Z7982 Long term (current) use of aspirin: Secondary | ICD-10-CM | POA: Diagnosis not present

## 2019-01-18 DIAGNOSIS — R109 Unspecified abdominal pain: Secondary | ICD-10-CM | POA: Insufficient documentation

## 2019-01-18 LAB — URINALYSIS, ROUTINE W REFLEX MICROSCOPIC
Bilirubin Urine: NEGATIVE
Glucose, UA: >=500 mg/dL — AB
Ketones, ur: NEGATIVE mg/dL
Leukocytes,Ua: NEGATIVE
Nitrite: NEGATIVE
Protein, ur: NEGATIVE mg/dL
Specific Gravity, Urine: 1.024 (ref 1.005–1.030)
pH: 7 (ref 5.0–8.0)

## 2019-01-18 LAB — I-STAT CHEM 8, ED
BUN: 9 mg/dL (ref 6–20)
Calcium, Ion: 1.18 mmol/L (ref 1.15–1.40)
Chloride: 98 mmol/L (ref 98–111)
Creatinine, Ser: 0.5 mg/dL (ref 0.44–1.00)
Glucose, Bld: 543 mg/dL (ref 70–99)
HCT: 39 % (ref 36.0–46.0)
Hemoglobin: 13.3 g/dL (ref 12.0–15.0)
Potassium: 3.9 mmol/L (ref 3.5–5.1)
Sodium: 136 mmol/L (ref 135–145)
TCO2: 25 mmol/L (ref 22–32)

## 2019-01-18 LAB — CBG MONITORING, ED
Glucose-Capillary: 522 mg/dL (ref 70–99)
Glucose-Capillary: 412 mg/dL — ABNORMAL HIGH (ref 70–99)
Glucose-Capillary: 359 mg/dL — ABNORMAL HIGH (ref 70–99)

## 2019-01-18 LAB — CBC WITH DIFFERENTIAL/PLATELET
Abs Immature Granulocytes: 0.01 10*3/uL (ref 0.00–0.07)
Basophils Absolute: 0 10*3/uL (ref 0.0–0.1)
Basophils Relative: 1 %
Eosinophils Absolute: 0.1 10*3/uL (ref 0.0–0.5)
Eosinophils Relative: 3 %
HCT: 38.8 % (ref 36.0–46.0)
Hemoglobin: 12.5 g/dL (ref 12.0–15.0)
Immature Granulocytes: 0 %
Lymphocytes Relative: 33 %
Lymphs Abs: 1.5 10*3/uL (ref 0.7–4.0)
MCH: 28.5 pg (ref 26.0–34.0)
MCHC: 32.2 g/dL (ref 30.0–36.0)
MCV: 88.6 fL (ref 80.0–100.0)
Monocytes Absolute: 0.3 10*3/uL (ref 0.1–1.0)
Monocytes Relative: 7 %
Neutro Abs: 2.5 10*3/uL (ref 1.7–7.7)
Neutrophils Relative %: 56 %
Platelets: 169 10*3/uL (ref 150–400)
RBC: 4.38 MIL/uL (ref 3.87–5.11)
RDW: 11.9 % (ref 11.5–15.5)
WBC: 4.4 10*3/uL (ref 4.0–10.5)
nRBC: 0 % (ref 0.0–0.2)

## 2019-01-18 LAB — BASIC METABOLIC PANEL
Anion gap: 9 (ref 5–15)
BUN: 10 mg/dL (ref 6–20)
CO2: 24 mmol/L (ref 22–32)
Calcium: 8.6 mg/dL — ABNORMAL LOW (ref 8.9–10.3)
Chloride: 98 mmol/L (ref 98–111)
Creatinine, Ser: 0.71 mg/dL (ref 0.44–1.00)
GFR calc Af Amer: >60 mL/min (ref >60)
GFR calc non Af Amer: >60 mL/min (ref >60)
Glucose, Bld: 543 mg/dL (ref 70–99)
Potassium: 3.7 mmol/L (ref 3.5–5.1)
Sodium: 131 mmol/L — ABNORMAL LOW (ref 135–145)

## 2019-01-18 MED ORDER — METHOCARBAMOL 500 MG PO TABS: 500.0000 mg | ORAL_TABLET | Freq: Two times a day (BID) | ORAL | 0 refills | Status: DC

## 2019-01-18 MED ORDER — MORPHINE SULFATE (PF) 4 MG/ML IV SOLN
4.0000 mg | Freq: Once | INTRAVENOUS | Status: AC
  Administered 2019-01-18: 4 mg via INTRAVENOUS
  Filled 2019-01-18: qty 1

## 2019-01-18 MED ORDER — MORPHINE SULFATE (PF) 2 MG/ML IV SOLN
4.0000 mg | Freq: Once | INTRAVENOUS | Status: AC
  Administered 2019-01-18: 4 mg via INTRAVENOUS
  Filled 2019-01-18: qty 2

## 2019-01-18 MED ORDER — INSULIN ASPART 100 UNIT/ML ~~LOC~~ SOLN
10.0000 [IU] | Freq: Once | SUBCUTANEOUS | Status: AC
  Administered 2019-01-18: 10 [IU] via SUBCUTANEOUS
  Filled 2019-01-18: qty 1

## 2019-01-18 MED ORDER — ONDANSETRON HCL 4 MG/5ML PO SOLN
4.0000 mg | Freq: Once | ORAL | Status: AC
  Administered 2019-01-18: 4 mg via ORAL
  Filled 2019-01-18: qty 1

## 2019-01-18 MED ORDER — IOHEXOL 300 MG/ML  SOLN
100.0000 mL | Freq: Once | INTRAMUSCULAR | Status: AC | PRN
  Administered 2019-01-18: 100 mL via INTRAVENOUS

## 2019-01-18 MED ORDER — SODIUM CHLORIDE 0.9 % IV BOLUS
1000.0000 mL | Freq: Once | INTRAVENOUS | Status: AC
  Administered 2019-01-18: 1000 mL via INTRAVENOUS
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
## 2019-01-18 NOTE — ED Notes (Signed)
Patient transported to CT 

## 2019-01-18 NOTE — ED Notes (Signed)
ED Provider at bedside. 
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
## 2019-01-18 NOTE — ED Notes (Signed)
CRITICAL VALUE STICKER  CRITICAL VALUE: Glucose  RECEIVER (on-site recipient of call): Jeanice Lim, RN  DATE & TIME NOTIFIED: 01/18/19 1800  MESSENGER (representative from lab): B. Lia Hopping  MD NOTIFIED: Nuala Alpha, PA  TIME OF NOTIFICATION: 1803  RESPONSE: No orders at time of notification
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
## 2019-01-18 NOTE — ED Notes (Signed)
Pt ambulatory to restroom with no difficulty or distress noted at this time. 

## 2019-01-21 LAB — URINE CULTURE: Culture: >=100000 — AB

## 2019-01-22 ENCOUNTER — Telehealth: Payer: Self-pay | Admitting: Emergency Medicine

## 2019-01-22 NOTE — Telephone Encounter (Signed)
Post ED Visit - Positive Culture Follow-up  Culture report reviewed by antimicrobial stewardship pharmacist: [place] Team []  Elenor Quinones, Pharm.D. []  Heide Guile, Pharm.D., BCPS AQ-ID []  Parks Neptune, Pharm.D., BCPS []  Alycia Rossetti, Pharm.D., BCPS []  [place], [place].D., BCPS, AAHIVP []  Legrand Como, Pharm.D., BCPS, AAHIVP []  Salome Arnt, PharmD, BCPS []  Johnnette Gourd, PharmD, BCPS []  Hughes Better, PharmD, BCPS [x]  [name], PharmD []  Laqueta Linden, PharmD, BCPS []  Albertina Parr, PharmD  [place] Team []  Leodis Sias, PharmD []  Lindell Spar, PharmD []  Royetta Asal, PharmD []  Graylin Shiver, Rph []  Rema Fendt) Glennon Mac, PharmD []  Arlyn Dunning, PharmD []  Netta Cedars, PharmD []  Dia Sitter, PharmD []  Leone Haven, PharmD []  Gretta Arab, PharmD []  Theodis Shove, PharmD []  Peggyann Juba, PharmD []  Reuel Boom, PharmD   Positive urine culture  No further patient follow-up is required at this time.  Sandi Raveling Duvan Mousel 01/22/2019, 2:12 PM

## 2019-01-22 NOTE — Progress Notes (Signed)
ED Antimicrobial Stewardship Positive Culture Follow Up   [name] is an 53 y.o. female who presented to [facility] on 01/18/2019 with a chief complaint of  Chief Complaint  Patient presents with  . Marine scientist    Recent Results (from the past 720 hour(s))  Urine culture     Status: Abnormal   Collection Time: 01/18/19  5:20 PM   Specimen: Urine, Random  Result Value Ref Range Status   Specimen Description   Final    URINE, RANDOM Performed at [facility], [address] [place], [place] [address]    Special Requests   Final    NONE Performed at [facility], [address], [place], [place] [address]    Culture >=100,000 COLONIES/mL ESCHERICHIA COLI (A)  Final   Report Status 01/21/2019 FINAL  Final   Organism ID, Bacteria ESCHERICHIA COLI (A)  Final      Susceptibility   Escherichia coli - MIC*    AMPICILLIN >=32 RESISTANT Resistant     CEFAZOLIN <=4 SENSITIVE Sensitive     CEFTRIAXONE <=1 SENSITIVE Sensitive     CIPROFLOXACIN <=0.25 SENSITIVE Sensitive     GENTAMICIN <=1 SENSITIVE Sensitive     IMIPENEM <=0.25 SENSITIVE Sensitive     NITROFURANTOIN <=16 SENSITIVE Sensitive     TRIMETH/SULFA <=20 SENSITIVE Sensitive     AMPICILLIN/SULBACTAM >=32 RESISTANT Resistant     PIP/TAZO <=4 SENSITIVE Sensitive     Extended ESBL NEGATIVE Sensitive     * >=100,000 COLONIES/mL ESCHERICHIA COLI    [x]  Patient discharged originally without antimicrobial agent  New antibiotic prescription: No further treatment indicated for asymptomatic bacteriuria.  ED Provider: Janetta Hora, PA-C   [name] P 01/22/2019, 12:01 PM Clinical Pharmacist Monday - Friday phone -  [phone] Saturday - Sunday phone - [phone]

## 2019-01-24 ENCOUNTER — Other Ambulatory Visit: Payer: Self-pay | Admitting: *Deleted

## 2019-01-24 DIAGNOSIS — Z20822 Contact with and (suspected) exposure to covid-19: Secondary | ICD-10-CM

## 2019-01-25 LAB — NOVEL CORONAVIRUS, NAA: SARS-CoV-2, NAA: NOT DETECTED

## 2019-02-01 ENCOUNTER — Ambulatory Visit: Payer: BC Managed Care – PPO | Admitting: Orthopedic Surgery

## 2019-02-02 ENCOUNTER — Encounter: Payer: Self-pay | Admitting: Orthopedic Surgery

## 2019-03-11 ENCOUNTER — Emergency Department (HOSPITAL_COMMUNITY): Payer: BC Managed Care – PPO

## 2019-03-11 ENCOUNTER — Inpatient Hospital Stay (HOSPITAL_COMMUNITY)
Admission: EM | Admit: 2019-03-11 | Discharge: 2019-03-15 | DRG: 066 | Disposition: A | Discharge status: Home Health | Payer: BC Managed Care – PPO | Attending: Internal Medicine | Admitting: Internal Medicine

## 2019-03-11 ENCOUNTER — Other Ambulatory Visit: Payer: Self-pay

## 2019-03-11 DIAGNOSIS — Z91040 Latex allergy status: Secondary | ICD-10-CM

## 2019-03-11 DIAGNOSIS — I639 Cerebral infarction, unspecified: Principal | ICD-10-CM | POA: Diagnosis present

## 2019-03-11 DIAGNOSIS — R2990 Unspecified symptoms and signs involving the nervous system: Secondary | ICD-10-CM

## 2019-03-11 DIAGNOSIS — Z833 Family history of diabetes mellitus: Secondary | ICD-10-CM

## 2019-03-11 DIAGNOSIS — Z794 Long term (current) use of insulin: Secondary | ICD-10-CM

## 2019-03-11 DIAGNOSIS — E1165 Type 2 diabetes mellitus with hyperglycemia: Secondary | ICD-10-CM | POA: Diagnosis present

## 2019-03-11 DIAGNOSIS — G459 Transient cerebral ischemic attack, unspecified: Secondary | ICD-10-CM | POA: Diagnosis not present

## 2019-03-11 DIAGNOSIS — H5347 Heteronymous bilateral field defects: Secondary | ICD-10-CM | POA: Diagnosis present

## 2019-03-11 DIAGNOSIS — G35 Multiple sclerosis: Secondary | ICD-10-CM | POA: Diagnosis present

## 2019-03-11 DIAGNOSIS — E785 Hyperlipidemia, unspecified: Secondary | ICD-10-CM | POA: Diagnosis present

## 2019-03-11 DIAGNOSIS — R29810 Facial weakness: Secondary | ICD-10-CM | POA: Diagnosis present

## 2019-03-11 DIAGNOSIS — F419 Anxiety disorder, unspecified: Secondary | ICD-10-CM | POA: Diagnosis present

## 2019-03-11 DIAGNOSIS — Z20828 Contact with and (suspected) exposure to other viral communicable diseases: Secondary | ICD-10-CM | POA: Diagnosis present

## 2019-03-11 DIAGNOSIS — F329 Major depressive disorder, single episode, unspecified: Secondary | ICD-10-CM | POA: Diagnosis present

## 2019-03-11 DIAGNOSIS — K760 Fatty (change of) liver, not elsewhere classified: Secondary | ICD-10-CM | POA: Diagnosis present

## 2019-03-11 DIAGNOSIS — J45909 Unspecified asthma, uncomplicated: Secondary | ICD-10-CM | POA: Diagnosis present

## 2019-03-11 DIAGNOSIS — Z6835 Body mass index (BMI) 35.0-35.9, adult: Secondary | ICD-10-CM

## 2019-03-11 DIAGNOSIS — K219 Gastro-esophageal reflux disease without esophagitis: Secondary | ICD-10-CM | POA: Diagnosis present

## 2019-03-11 DIAGNOSIS — E11649 Type 2 diabetes mellitus with hypoglycemia without coma: Secondary | ICD-10-CM | POA: Diagnosis not present

## 2019-03-11 DIAGNOSIS — G43009 Migraine without aura, not intractable, without status migrainosus: Secondary | ICD-10-CM | POA: Diagnosis present

## 2019-03-11 DIAGNOSIS — Z8673 Personal history of transient ischemic attack (TIA), and cerebral infarction without residual deficits: Secondary | ICD-10-CM

## 2019-03-11 DIAGNOSIS — Z91010 Allergy to peanuts: Secondary | ICD-10-CM

## 2019-03-11 DIAGNOSIS — R29706 NIHSS score 6: Secondary | ICD-10-CM | POA: Diagnosis present

## 2019-03-11 DIAGNOSIS — I10 Essential (primary) hypertension: Secondary | ICD-10-CM | POA: Diagnosis present

## 2019-03-11 DIAGNOSIS — Z91018 Allergy to other foods: Secondary | ICD-10-CM

## 2019-03-11 DIAGNOSIS — Z91048 Other nonmedicinal substance allergy status: Secondary | ICD-10-CM

## 2019-03-11 DIAGNOSIS — Z7982 Long term (current) use of aspirin: Secondary | ICD-10-CM

## 2019-03-11 DIAGNOSIS — Z91013 Allergy to seafood: Secondary | ICD-10-CM

## 2019-03-11 LAB — CBC
HCT: 37.9 % (ref 36.0–46.0)
Hemoglobin: 12.7 g/dL (ref 12.0–15.0)
MCH: 29 pg (ref 26.0–34.0)
MCHC: 33.5 g/dL (ref 30.0–36.0)
MCV: 86.5 fL (ref 80.0–100.0)
Platelets: 183 10*3/uL (ref 150–400)
RBC: 4.38 MIL/uL (ref 3.87–5.11)
RDW: 12.1 % (ref 11.5–15.5)
WBC: 5.7 10*3/uL (ref 4.0–10.5)
nRBC: 0 % (ref 0.0–0.2)

## 2019-03-11 LAB — COMPREHENSIVE METABOLIC PANEL
ALT: 61 U/L — ABNORMAL HIGH (ref 0–44)
AST: 51 U/L — ABNORMAL HIGH (ref 15–41)
Albumin: 3.4 g/dL — ABNORMAL LOW (ref 3.5–5.0)
Alkaline Phosphatase: 125 U/L (ref 38–126)
Anion gap: 9 (ref 5–15)
BUN: 10 mg/dL (ref 6–20)
CO2: 26 mmol/L (ref 22–32)
Calcium: 8.4 mg/dL — ABNORMAL LOW (ref 8.9–10.3)
Chloride: 99 mmol/L (ref 98–111)
Creatinine, Ser: 0.84 mg/dL (ref 0.44–1.00)
GFR calc Af Amer: >60 mL/min (ref >60)
GFR calc non Af Amer: >60 mL/min (ref >60)
Glucose, Bld: 437 mg/dL — ABNORMAL HIGH (ref 70–99)
Potassium: 3.7 mmol/L (ref 3.5–5.1)
Sodium: 134 mmol/L — ABNORMAL LOW (ref 135–145)
Total Bilirubin: 0.2 mg/dL — ABNORMAL LOW (ref 0.3–1.2)
Total Protein: 7.3 g/dL (ref 6.5–8.1)

## 2019-03-11 LAB — DIFFERENTIAL
Abs Immature Granulocytes: 0.01 10*3/uL (ref 0.00–0.07)
Basophils Absolute: 0 10*3/uL (ref 0.0–0.1)
Basophils Relative: 1 %
Eosinophils Absolute: 0.2 10*3/uL (ref 0.0–0.5)
Eosinophils Relative: 3 %
Immature Granulocytes: 0 %
Lymphocytes Relative: 37 %
Lymphs Abs: 2.1 10*3/uL (ref 0.7–4.0)
Monocytes Absolute: 0.4 10*3/uL (ref 0.1–1.0)
Monocytes Relative: 6 %
Neutro Abs: 3 10*3/uL (ref 1.7–7.7)
Neutrophils Relative %: 53 %

## 2019-03-11 LAB — PROTIME-INR
INR: 0.9 (ref 0.8–1.2)
Prothrombin Time: 11.7 seconds (ref 11.4–15.2)

## 2019-03-11 LAB — CBG MONITORING, ED: Glucose-Capillary: 415 mg/dL — ABNORMAL HIGH (ref 70–99)

## 2019-03-11 LAB — APTT: aPTT: 25 seconds (ref 24–36)

## 2019-03-11 LAB — ETHANOL: Alcohol, Ethyl (B): <10 mg/dL (ref <10)

## 2019-03-11 MED ORDER — KETOROLAC TROMETHAMINE 30 MG/ML IJ SOLN
30.0000 mg | Freq: Once | INTRAMUSCULAR | Status: AC
  Administered 2019-03-11: 23:00:00 30 mg via INTRAVENOUS
  Filled 2019-03-11: qty 1

## 2019-03-11 MED ORDER — IOHEXOL 350 MG/ML SOLN
115.0000 mL | Freq: Once | INTRAVENOUS | Status: AC | PRN
  Administered 2019-03-11: 115 mL via INTRAVENOUS

## 2019-03-11 NOTE — ED Provider Notes (Signed)
Emergency Department Provider Note   I have reviewed the triage vital signs and the nursing notes.   HISTORY  Chief Complaint Code Stroke   HPI [name] is a 53 y.o. female presents to the ED with acute onset left face weakness/numbness with blurred vision and upper extremity weakness.  Symptoms began 30 minutes prior to ED presentation.  She reports history of prior "mini stroke" and MS. she is not taking any medication for MS at this time.  She notes an associated headache with her symptoms but denies prior history of migraine or other headache disorder.  She denies fevers or chills.  She is not having chest pain or heart palpitations. She arrived by private vehicle.   Past Medical History:  Diagnosis Date  . Asthma   . Chronic abdominal pain   . Chronic neck pain    C4-6 fusion  . CONSTIPATION 05/16/2009   Qualifier: Diagnosis of  By: Yetta Barre FNP-BC, Kandice L   . Diabetes mellitus without complication (HCC)   . Dysphagia 02/23/2012  . Fatty liver   . GERD (gastroesophageal reflux disease)   . Helicobacter pylori gastritis 2011  . Hyperlipidemia   . Hypertension   . LIVER FUNCTION TESTS, ABNORMAL, HX OF 05/14/2009   Qualifier: Diagnosis of  By: Ricard Dillon    . Migraine headache   . MIGRAINE, COMMON 05/14/2009   Qualifier: Diagnosis of  By: Ricard Dillon    . MS (multiple sclerosis) (HCC)   . Sleep apnea     Patient Active Problem List   Diagnosis Date Noted  . Facial droop 03/12/2019  . Vitamin D deficiency 08/26/2017  . Uncontrolled type 2 diabetes mellitus with hyperglycemia (HCC) 03/15/2017  . Essential hypertension, benign 03/15/2017  . Class 2 severe obesity due to excess calories with serious comorbidity and body mass index (BMI) of 35.0 to 35.9 in adult (HCC) 03/15/2017  . Spondylosis of thoracic region without myelopathy or radiculopathy 07/02/2014  . Abdominal pain, epigastric 10/19/2013  . Unspecified constipation 10/19/2013  . Chest pain  04/16/2012  . Abnormal EKG 04/16/2012  . HTN (hypertension) 04/16/2012  . Mixed hyperlipidemia 04/16/2012  . Multiple sclerosis (HCC) 04/16/2012  . 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 Dysphagia 02/23/2012  . FATTY LIVER DISEASE 05/16/2009  . HELICOBACTER PYLORI GASTRITIS 05/14/2009  . ASTHMA 05/14/2009  . Esophageal reflux 05/14/2009  . NAUSEA 05/14/2009  . Diarrhea 05/14/2009  . DEPRESSION, HX OF 05/14/2009    Past Surgical History:  Procedure Laterality Date  . ABDOMINAL HYSTERECTOMY    . APPENDECTOMY    . BACK SURGERY  03/07/2012   Nerve Stimulator  . CHOLECYSTECTOMY    . COLONOSCOPY  07/09/2009   RMR; normal rectum aside from anal canal hemorrhoids/scattered left-sided diverticula  . ESOPHAGOGASTRODUODENOSCOPY  05/22/2009   RMR; normal /small HH  . ESOPHAGOGASTRODUODENOSCOPY  2007   RMR: non-critical Schatzki's rin, non-maniuplated  . ESOPHAGOGASTRODUODENOSCOPY N/A 10/24/2013   Procedure: ESOPHAGOGASTRODUODENOSCOPY (EGD);  Surgeon: Corbin Ade, MD;  [facility];  Service: Endoscopy;  Laterality: N/A;  9:45  . ESOPHAGOGASTRODUODENOSCOPY (EGD) WITH ESOPHAGEAL DILATION  03/17/2012   HKV:[id number] appearing esophageal mucosa of uncertain significance. Status post [name] dilation followed by esophageal bx  . MALONEY DILATION N/A 10/24/2013   Procedure: [name] DILATION;  Surgeon: Corbin Ade, MD;  Location: [facility];  Service: Endoscopy;  Laterality: N/A;  . MANDIBLE SURGERY    . NECK SURGERY     X2, last time 2013  Allergies Peanut-containing drug products, Shellfish allergy, Gluten meal, Latex, Prednisone, Nexium [esomeprazole magnesium], and Tape  Family History  Problem Relation Age of Onset  . Cancer Mother        unknown type  . Diabetes Father   . Diabetes Sister   . Colon cancer Neg Hx     Social History Social History   Tobacco Use  . Smoking status: Never Smoker  . Smokeless tobacco: Never Used    Substance Use Topics  . Alcohol use: No  . Drug use: No    Review of Systems  Constitutional: No fever/chills Eyes: Positive blurry vision.  ENT: No sore throat. Cardiovascular: Denies chest pain. Respiratory: Denies shortness of breath. Gastrointestinal: No abdominal pain.  No nausea, no vomiting.  No diarrhea.  No constipation. Genitourinary: Negative for dysuria. Musculoskeletal: Negative for back pain. Skin: Negative for rash. Neurological: Positive headache with left face weakness/numbness and the left upper extremity weakness.   10-point ROS otherwise negative.  ____________________________________________   PHYSICAL EXAM:  VITAL SIGNS: ED Triage Vitals  Enc Vitals Group     BP 03/11/19 2036 (!) 141/54     Pulse Rate 03/11/19 2036 96     Resp 03/11/19 2036 20     Temp --      Temp Source 03/11/19 2036 Oral     SpO2 --      Weight 03/11/19 2040 190 lb (86.2 kg)     Height 03/11/19 2040 5\' 5"  (1.651 m)   Constitutional: Alert and oriented. Well appearing and in no acute distress. Eyes: Conjunctivae are normal. PERRL. EOMI  Head: Atraumatic. Nose: No congestion/rhinnorhea. Mouth/Throat: Mucous membranes are moist.  Neck: No stridor.   Cardiovascular: Normal rate, regular rhythm. Good peripheral circulation. Grossly normal heart sounds.   Respiratory: Normal respiratory effort.  No retractions. Lungs CTAB. Gastrointestinal: Soft and nontender. No distention.  Musculoskeletal: No lower extremity tenderness nor edema. No gross deformities of extremities. Neurologic:  Normal speech and language.  Left facial droop noted with decreased sensation on the left.  No ptosis.  Patient with 4+ out of 5 strength in the left upper extremity without significant pronator drift.  Skin:  Skin is warm, dry and intact. No rash noted.   ____________________________________________   LABS (all labs ordered are listed, but only abnormal results are displayed)  Labs Reviewed   COMPREHENSIVE METABOLIC PANEL - Abnormal; Notable for the following components:      Result Value   Sodium 134 (*)    Glucose, Bld 437 (*)    Calcium 8.4 (*)    Albumin 3.4 (*)    AST 51 (*)    ALT 61 (*)    Total Bilirubin 0.2 (*)    All other components within normal limits  URINALYSIS, ROUTINE W REFLEX MICROSCOPIC - Abnormal; Notable for the following components:   APPearance HAZY (*)    Specific Gravity, Urine >1.046 (*)    Glucose, UA >=500 (*)    Hgb urine dipstick SMALL (*)    Nitrite POSITIVE (*)    Leukocytes,Ua MODERATE (*)    WBC, UA >50 (*)    Bacteria, UA RARE (*)    All other components within normal limits  CBG MONITORING, ED - Abnormal; Notable for the following components:   Glucose-Capillary 415 (*)    All other components within normal limits  SARS CORONAVIRUS 2 (TAT 6-24 HRS)  ETHANOL  PROTIME-INR  APTT  CBC  DIFFERENTIAL  RAPID URINE DRUG SCREEN, HOSP PERFORMED  LIPID  PANEL  HEMOGLOBIN A1C  HIV ANTIBODY (ROUTINE TESTING W REFLEX)   ____________________________________________  EKG   EKG Interpretation  Date/Time:  Saturday March 11 2019 21:02:19 EST Ventricular Rate:  93 PR Interval:    QRS Duration: 97 QT Interval:  390 QTC Calculation: 486 R Axis:   -37 Text Interpretation: Sinus rhythm Left axis deviation Abnormal R-wave progression, early transition Borderline prolonged QT interval No STEMI Confirmed by Alona Bene [phone]) on 03/11/2019 9:03:46 PM       ____________________________________________  RADIOLOGY  CT ANGIO HEAD W OR WO CONTRAST  Result Date: 03/11/2019 CLINICAL DATA:  Focal neuro deficit. EXAM: CT ANGIOGRAPHY HEAD AND NECK CT PERFUSION BRAIN TECHNIQUE: Multidetector CT imaging of the head and neck was performed using the standard protocol during bolus administration of intravenous contrast. Multiplanar CT image reconstructions and MIPs were obtained to evaluate the vascular anatomy. Carotid stenosis measurements  (when applicable) are obtained utilizing NASCET criteria, using the distal internal carotid diameter as the denominator. Multiphase CT imaging of the brain was performed following IV bolus contrast injection. Subsequent parametric perfusion maps were calculated using RAPID software. CONTRAST:  OMNIPAQUE IOHEXOL 350 MG/ML SOLN COMPARISON:  CT head 03/11/2019 FINDINGS: CTA NECK FINDINGS Aortic arch: Standard branching. Imaged portion shows no evidence of aneurysm or dissection. No significant stenosis of the major arch vessel origins. Right carotid system: Normal right carotid. Negative for atherosclerotic disease or stenosis Left carotid system: Normal left carotid. Negative for atherosclerotic disease or stenosis. Vertebral arteries: Both vertebral arteries patent to the basilar without stenosis Skeleton: ACDF with pseudarthrosis at C4-5. ACDF with solid fusion C5-6 and C6-7. No acute skeletal abnormality. Other neck: 1 cm right thyroid nodule. Negative for mass or adenopathy. Upper chest: Lung apices clear bilaterally. Review of the MIP images confirms the above findings CTA HEAD FINDINGS Anterior circulation: Cavernous carotid widely patent bilaterally. Anterior and middle cerebral arteries widely patent bilaterally. Posterior circulation: Both vertebral arteries patent to the basilar. Basilar widely patent. PICA, AICA widely patent. Superior cerebellar and posterior cerebral arteries patent bilaterally without stenosis. Venous sinuses: Normal venous enhancement Anatomic variants: None Review of the MIP images confirms the above findings CT Brain Perfusion Findings: ASPECTS: 10 CBF (<30%) Volume: 0mL Perfusion (Tmax>6.0s) volume: 0mL Mismatch Volume: 0mL Infarction Location:None IMPRESSION: 1. CT perfusion negative for acute infarct or ischemia 2. Negative CTA head and neck. Electronically Signed   By: Marlan Palau M.D.   On: 03/11/2019 21:52   DG Chest 2 View  Result Date: 03/12/2019 CLINICAL DATA:   Left-sided weakness EXAM: CHEST - 2 VIEW COMPARISON:  01/18/2019 FINDINGS: Cardiac shadow is stable. Spinal stimulator and cervical spine surgery are seen. The lungs are clear. No sizable effusion or infiltrate is noted. No bony abnormality is seen. IMPRESSION: No active cardiopulmonary disease. Electronically Signed   By: Alcide Clever M.D.   On: 03/12/2019 01:42   CT ANGIO NECK W OR WO CONTRAST  Result Date: 03/11/2019 CLINICAL DATA:  Focal neuro deficit. EXAM: CT ANGIOGRAPHY HEAD AND NECK CT PERFUSION BRAIN TECHNIQUE: Multidetector CT imaging of the head and neck was performed using the standard protocol during bolus administration of intravenous contrast. Multiplanar CT image reconstructions and MIPs were obtained to evaluate the vascular anatomy. Carotid stenosis measurements (when applicable) are obtained utilizing NASCET criteria, using the distal internal carotid diameter as the denominator. Multiphase CT imaging of the brain was performed following IV bolus contrast injection. Subsequent parametric perfusion maps were calculated using RAPID software. CONTRAST:  OMNIPAQUE IOHEXOL  350 MG/ML SOLN COMPARISON:  CT head 03/11/2019 FINDINGS: CTA NECK FINDINGS Aortic arch: Standard branching. Imaged portion shows no evidence of aneurysm or dissection. No significant stenosis of the major arch vessel origins. Right carotid system: Normal right carotid. Negative for atherosclerotic disease or stenosis Left carotid system: Normal left carotid. Negative for atherosclerotic disease or stenosis. Vertebral arteries: Both vertebral arteries patent to the basilar without stenosis Skeleton: ACDF with pseudarthrosis at C4-5. ACDF with solid fusion C5-6 and C6-7. No acute skeletal abnormality. Other neck: 1 cm right thyroid nodule. Negative for mass or adenopathy. Upper chest: Lung apices clear bilaterally. Review of the MIP images confirms the above findings CTA HEAD FINDINGS Anterior circulation: Cavernous carotid  widely patent bilaterally. Anterior and middle cerebral arteries widely patent bilaterally. Posterior circulation: Both vertebral arteries patent to the basilar. Basilar widely patent. PICA, AICA widely patent. Superior cerebellar and posterior cerebral arteries patent bilaterally without stenosis. Venous sinuses: Normal venous enhancement Anatomic variants: None Review of the MIP images confirms the above findings CT Brain Perfusion Findings: ASPECTS: 10 CBF (<30%) Volume: 0mL Perfusion (Tmax>6.0s) volume: 0mL Mismatch Volume: 0mL Infarction Location:None IMPRESSION: 1. CT perfusion negative for acute infarct or ischemia 2. Negative CTA head and neck. Electronically Signed   By: Marlan Palauharles  Clark M.D.   On: 03/11/2019 21:52   CT CEREBRAL PERFUSION W CONTRAST  Result Date: 03/11/2019 CLINICAL DATA:  Focal neuro deficit. EXAM: CT ANGIOGRAPHY HEAD AND NECK CT PERFUSION BRAIN TECHNIQUE: Multidetector CT imaging of the head and neck was performed using the standard protocol during bolus administration of intravenous contrast. Multiplanar CT image reconstructions and MIPs were obtained to evaluate the vascular anatomy. Carotid stenosis measurements (when applicable) are obtained utilizing NASCET criteria, using the distal internal carotid diameter as the denominator. Multiphase CT imaging of the brain was performed following IV bolus contrast injection. Subsequent parametric perfusion maps were calculated using RAPID software. CONTRAST:  115mL OMNIPAQUE IOHEXOL 350 MG/ML SOLN COMPARISON:  CT head 03/11/2019 FINDINGS: CTA NECK FINDINGS Aortic arch: Standard branching. Imaged portion shows no evidence of aneurysm or dissection. No significant stenosis of the major arch vessel origins. Right carotid system: Normal right carotid. Negative for atherosclerotic disease or stenosis Left carotid system: Normal left carotid. Negative for atherosclerotic disease or stenosis. Vertebral arteries: Both vertebral arteries patent to  the basilar without stenosis Skeleton: ACDF with pseudarthrosis at C4-5. ACDF with solid fusion C5-6 and C6-7. No acute skeletal abnormality. Other neck: 1 cm right thyroid nodule. Negative for mass or adenopathy. Upper chest: Lung apices clear bilaterally. Review of the MIP images confirms the above findings CTA HEAD FINDINGS Anterior circulation: Cavernous carotid widely patent bilaterally. Anterior and middle cerebral arteries widely patent bilaterally. Posterior circulation: Both vertebral arteries patent to the basilar. Basilar widely patent. PICA, AICA widely patent. Superior cerebellar and posterior cerebral arteries patent bilaterally without stenosis. Venous sinuses: Normal venous enhancement Anatomic variants: None Review of the MIP images confirms the above findings CT Brain Perfusion Findings: ASPECTS: 10 CBF (<30%) Volume: 0mL Perfusion (Tmax>6.0s) volume: 0mL Mismatch Volume: 0mL Infarction Location:None IMPRESSION: 1. CT perfusion negative for acute infarct or ischemia 2. Negative CTA head and neck. Electronically Signed   By: Marlan Palauharles  Clark M.D.   On: 03/11/2019 21:52   CT HEAD CODE STROKE WO CONTRAST  Result Date: 03/11/2019 CLINICAL DATA:  Code stroke. Ataxia. Left facial numbness. Blurred vision. Right-sided weakness. EXAM: CT HEAD WITHOUT CONTRAST TECHNIQUE: Contiguous axial images were obtained from the base of the skull through the vertex without  intravenous contrast. COMPARISON:  CT head 01/18/2019 FINDINGS: Brain: No evidence of acute infarction, hemorrhage, hydrocephalus, extra-axial collection or mass lesion/mass effect. Vascular: Negative for hyperdense vessel Skull: Negative Sinuses/Orbits: Negative Other: None ASPECTS (Alberta Stroke Program Early CT Score) - Ganglionic level infarction (caudate, lentiform nuclei, internal capsule, insula, M1-M3 cortex): 7 - Supraganglionic infarction (M4-M6 cortex): 3 Total score (0-10 with 10 being normal): 10 IMPRESSION: 1. Negative CT head 2.  ASPECTS is 10 3. These results were called by telephone at the time of interpretation on 03/11/2019 at 9:00 pm to provider Severina Sykora , who verbally acknowledged these results. Electronically Signed   By: Marlan Palauharles  Clark M.D.   On: 03/11/2019 21:00    ____________________________________________   PROCEDURES  Procedure(s) performed:   Procedures  CRITICAL CARE Performed by: Maia PlanJoshua G Lanice Folden Total critical care time: 35 minutes Critical care time was exclusive of separately billable procedures and treating other patients. Critical care was necessary to treat or prevent imminent or life-threatening deterioration. Critical care was time spent personally by me on the following activities: development of treatment plan with patient and/or surrogate as well as nursing, discussions with consultants, evaluation of patient's response to treatment, examination of patient, obtaining history from patient or surrogate, ordering and performing treatments and interventions, ordering and review of laboratory studies, ordering and review of radiographic studies, pulse oximetry and re-evaluation of patient's condition.  Alona BeneJoshua Tonya Carlile, MD Emergency Medicine  ____________________________________________   INITIAL IMPRESSION / ASSESSMENT AND PLAN / ED COURSE  Pertinent labs & imaging results that were available during my care of the patient were reviewed by me and considered in my medical decision making (see chart for details).   Patient presents to the emergency department for evaluation of acute onset left face weakness.  Patient describing some perceived weakness in the right upper extremity I feel she has some if anything on the left.  Exam is somewhat inconsistent but given her acute onset of symptoms I have activated a code stroke.  Complex migraine also on the differential although does not have a history of this.  Spoke with radiology read the CT scan of the head is normal.   CTA reviewed along with  labs. Plan for CVA w/u.   Discussed patient's case with [facility], Dr. Sharl MaLama to request admission. Patient and family (if present) updated with plan. [facility]  I reviewed all nursing notes, vitals, pertinent old records, EKGs, labs, imaging (as available).  ____________________________________________  FINAL CLINICAL IMPRESSION(S) / ED DIAGNOSES  Final diagnoses:  Stroke-like symptoms  TIA (transient ischemic attack)     MEDICATIONS GIVEN DURING THIS VISIT:  Medications  oxyCODONE (Oxy IR/ROXICODONE) immediate release tablet 10 mg (10 mg Oral Given 03/12/19 0832)  citalopram (CELEXA) tablet 40 mg (has no administration in time range)  gabapentin (NEURONTIN) capsule 800 mg (has no administration in time range)  baclofen (LIORESAL) tablet 20 mg (has no administration in time range)  methocarbamol (ROBAXIN) tablet 500 mg (500 mg Oral Given 03/12/19 0216)  albuterol (VENTOLIN HFA) 108 (90 Base) MCG/ACT inhaler 1-2 puff (has no administration in time range)   stroke: mapping our early stages of recovery book (0 each Does not apply Hold 03/12/19 0401)  0.9 %  sodium chloride infusion ( Intravenous New Bag/Given 03/12/19 0217)  acetaminophen (TYLENOL) tablet 650 mg (650 mg Oral Given 03/12/19 0216)    Or  acetaminophen (TYLENOL) 160 MG/5ML solution 650 mg ( Per Tube See Alternative 03/12/19 0216)    Or  acetaminophen (  TYLENOL) suppository 650 mg ( Rectal See Alternative 03/12/19 0216)  senna-docusate (Senokot-S) tablet 1 tablet (has no administration in time range)  enoxaparin (LOVENOX) injection 40 mg (has no administration in time range)  insulin regular human CONCENTRATED (HUMULIN R) 500 UNIT/ML kwikpen 180 Units (180 Units Subcutaneous Given 03/12/19 0913)  insulin aspart (novoLOG) injection 0-9 Units (7 Units Subcutaneous Given 03/12/19 0856)  aspirin EC tablet 325 mg (has no administration in time range)  pravastatin (PRAVACHOL) tablet 40 mg (has no administration  in time range)  famotidine (PEPCID) tablet 20 mg (has no administration in time range)  iohexol (OMNIPAQUE) 350 MG/ML injection 115 mL (115 mLs Intravenous Contrast Given 03/11/19 2129)  ketorolac (TORADOL) 30 MG/ML injection 30 mg (30 mg Intravenous Given 03/11/19 2238)    Note:  This document was prepared using Dragon voice recognition software and may include unintentional dictation errors.  Alona Bene, MD, [facility] Emergency Medicine    Keaunna Skipper, Arlyss Repress, MD 03/12/19 [phone]

## 2019-03-11 NOTE — ED Notes (Signed)
Pt hasn't been on MS meds in a while. Said that she is waiting to see Dr Shelia Media

## 2019-03-11 NOTE — ED Notes (Signed)
Pt return from CT.

## 2019-03-11 NOTE — Progress Notes (Signed)
2037 call time 2038 beeper time 2045 exam started 2046 exam finished 2046 images sent to [facility] 2047 exam completed in Epic 2047 [facility] radiology called

## 2019-03-11 NOTE — ED Triage Notes (Signed)
Patient states blurred vision, weakness on the right side and has facial droop that started about 30 minutes ago.

## 2019-03-11 NOTE — Consult Note (Addendum)
CTA H/N, CTP neg for LVO, no role for NIR.  Consider inpatient workup.  [facility] Tele[facility]urology Consult Services   Date of Service:   03/11/2019 20:51:39  Impression:     .  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 Pt not on any MS medications, states she has been compliant w DM and antihypertensive medications lately.  She has taken MS meds Rebiff and Copaxone in the past, nothing recently, pt falls frequently, but does not use cane/walker.  Metrics: Last Known Well: 03/11/2019 15:00:00 [facility] Notification Time: 03/11/2019 20:51:39 Arrival Time: 03/11/2019 20:21:00 Stamp Time: 03/11/2019 20:51:39 Time First Login Attempt: 03/11/2019 21:01:10 Video Start Time: 03/11/2019 21:01:10  Symptoms: blurry vision, headache, R sided weakness NIHSS Start Assessment Time: 03/11/2019 21:13:16 Patient is not a candidate for Alteplase/Activase. Patient was not deemed candidate for Alteplase/Activase thrombolytics because of Last Well Known Above 4.5 Hours.  CT head showed no acute hemorrhage or acute core infarct.  Lower Likelihood of Large Vessel Occlusion but Following Stat Studies are Recommended  CTA Head and Neck. CT Perfusion.   Sign Out: Stroke Workup if appropriate     .  Discussed with Emergency Department Provider    ------------------------------------------------------------------------------  History of Present Illness: Patient is a 53 year old Female.  Patient was brought by private transportation with symptoms of blurry vision, headache, R sided weakness  53 yo F h/o HTN, DM, MS, HL, previous TIA, migraine headaches p/w episode of R sided weakness, blurry vision, headache, L facial droop, last known well 1500. Pt not on any MS medications, states she  has been compliant w DM and antihypertensive medications lately.  She has taken MS meds Rebiff and Copaxone in the past, nothing recently, pt falls frequently, but does not use cane/walker.  Last seen normal was beyond 4.5 hours of presentation.  Past Medical History:     . Hypertension     . Diabetes Mellitus   Antiplatelet use: aspirin    Examination: BP(135/77), Pulse(96), Blood Glucose(415) 1A: Level of Consciousness - Alert; keenly responsive + 0 1B: Ask Month and Age - Both Questions Right + 0 1C: Blink Eyes & Squeeze Hands - Performs Both Tasks + 0 2: Test Horizontal Extraocular Movements - Normal + 0 3: Test Visual Fields - Partial Hemianopia + 1 4: Test Facial Palsy (Use Grimace if Obtunded) - Minor paralysis (flat nasolabial fold, smile asymmetry) + 1 5A: Test Left Arm Motor Drift - No Drift for 10 Seconds + 0 5B: Test Right Arm Motor Drift - Drift, but doesn't hit bed + 1 6A: Test Left Leg Motor Drift - Drift, but doesn't hit bed + 1 6B: Test Right Leg Motor Drift - Drift, but doesn't hit bed + 1 7: Test Limb Ataxia (FNF/Heel-Shin) - No Ataxia + 0 8: Test Sensation - Mild-Moderate Loss: Less Sharp/More Dull + 1 9: Test Language/Aphasia - Normal; No aphasia + 0 10: Test Dysarthria - Normal + 0 11: Test Extinction/Inattention - No abnormality + 0  NIHSS Score: 6  Pre-Morbid Modified Ranking Scale: 2 Points = Slight disability; unable to carry out all previous activities, but able to look after own affairs without assistance   Patient/Family was informed the Neurology Consult would happen via TeleHealth consult by way of interactive audio and video telecommunications and consented to receiving care  in this manner.   Due to the immediate potential for life-threatening deterioration due to underlying acute neurologic illness, I spent 30 minutes providing critical care. This time includes time for face to face visit via telemedicine, review of medical records, imaging  studies and discussion of findings with providers, the patient and/or family.   Dr Jordan Likes   [facility] [phone]   Case [id number]

## 2019-03-12 ENCOUNTER — Inpatient Hospital Stay (HOSPITAL_COMMUNITY): Payer: BC Managed Care – PPO

## 2019-03-12 DIAGNOSIS — I639 Cerebral infarction, unspecified: Secondary | ICD-10-CM | POA: Diagnosis present

## 2019-03-12 DIAGNOSIS — I10 Essential (primary) hypertension: Secondary | ICD-10-CM | POA: Diagnosis present

## 2019-03-12 DIAGNOSIS — G459 Transient cerebral ischemic attack, unspecified: Secondary | ICD-10-CM

## 2019-03-12 DIAGNOSIS — Z91040 Latex allergy status: Secondary | ICD-10-CM | POA: Diagnosis not present

## 2019-03-12 DIAGNOSIS — G43009 Migraine without aura, not intractable, without status migrainosus: Secondary | ICD-10-CM | POA: Diagnosis present

## 2019-03-12 DIAGNOSIS — K760 Fatty (change of) liver, not elsewhere classified: Secondary | ICD-10-CM | POA: Diagnosis present

## 2019-03-12 DIAGNOSIS — G35 Multiple sclerosis: Secondary | ICD-10-CM | POA: Diagnosis present

## 2019-03-12 DIAGNOSIS — R2990 Unspecified symptoms and signs involving the nervous system: Secondary | ICD-10-CM

## 2019-03-12 DIAGNOSIS — R29810 Facial weakness: Secondary | ICD-10-CM | POA: Diagnosis present

## 2019-03-12 DIAGNOSIS — Z8673 Personal history of transient ischemic attack (TIA), and cerebral infarction without residual deficits: Secondary | ICD-10-CM | POA: Diagnosis not present

## 2019-03-12 DIAGNOSIS — E11649 Type 2 diabetes mellitus with hypoglycemia without coma: Secondary | ICD-10-CM | POA: Diagnosis not present

## 2019-03-12 DIAGNOSIS — Z91010 Allergy to peanuts: Secondary | ICD-10-CM | POA: Diagnosis not present

## 2019-03-12 DIAGNOSIS — K219 Gastro-esophageal reflux disease without esophagitis: Secondary | ICD-10-CM | POA: Diagnosis present

## 2019-03-12 DIAGNOSIS — F329 Major depressive disorder, single episode, unspecified: Secondary | ICD-10-CM | POA: Diagnosis present

## 2019-03-12 DIAGNOSIS — Z794 Long term (current) use of insulin: Secondary | ICD-10-CM | POA: Diagnosis not present

## 2019-03-12 DIAGNOSIS — Z91018 Allergy to other foods: Secondary | ICD-10-CM | POA: Diagnosis not present

## 2019-03-12 DIAGNOSIS — J45909 Unspecified asthma, uncomplicated: Secondary | ICD-10-CM | POA: Diagnosis present

## 2019-03-12 DIAGNOSIS — E1165 Type 2 diabetes mellitus with hyperglycemia: Secondary | ICD-10-CM | POA: Diagnosis present

## 2019-03-12 DIAGNOSIS — Z833 Family history of diabetes mellitus: Secondary | ICD-10-CM | POA: Diagnosis not present

## 2019-03-12 DIAGNOSIS — Z91013 Allergy to seafood: Secondary | ICD-10-CM | POA: Diagnosis not present

## 2019-03-12 DIAGNOSIS — Z7982 Long term (current) use of aspirin: Secondary | ICD-10-CM | POA: Diagnosis not present

## 2019-03-12 DIAGNOSIS — E785 Hyperlipidemia, unspecified: Secondary | ICD-10-CM | POA: Diagnosis present

## 2019-03-12 DIAGNOSIS — F419 Anxiety disorder, unspecified: Secondary | ICD-10-CM | POA: Diagnosis present

## 2019-03-12 DIAGNOSIS — Z20828 Contact with and (suspected) exposure to other viral communicable diseases: Secondary | ICD-10-CM | POA: Diagnosis present

## 2019-03-12 DIAGNOSIS — Z91048 Other nonmedicinal substance allergy status: Secondary | ICD-10-CM | POA: Diagnosis not present

## 2019-03-12 LAB — LIPID PANEL
Cholesterol: 146 mg/dL (ref 0–200)
HDL: 44 mg/dL (ref >40)
LDL Cholesterol: 81 mg/dL (ref 0–99)
Total CHOL/HDL Ratio: 3.3 RATIO
Triglycerides: 105 mg/dL (ref <150)
VLDL: 21 mg/dL (ref 0–40)

## 2019-03-12 LAB — URINALYSIS, ROUTINE W REFLEX MICROSCOPIC
Bilirubin Urine: NEGATIVE
Glucose, UA: >=500 mg/dL — AB
Ketones, ur: NEGATIVE mg/dL
Nitrite: POSITIVE — AB
Protein, ur: NEGATIVE mg/dL
Specific Gravity, Urine: >1.046 — ABNORMAL HIGH (ref 1.005–1.030)
WBC, UA: >50 WBC/hpf — ABNORMAL HIGH (ref 0–5)
pH: 5 (ref 5.0–8.0)

## 2019-03-12 LAB — HEMOGLOBIN A1C
Hgb A1c MFr Bld: 14.7 % — ABNORMAL HIGH (ref 4.8–5.6)
Mean Plasma Glucose: 375.19 mg/dL

## 2019-03-12 LAB — CBG MONITORING, ED
Glucose-Capillary: 323 mg/dL — ABNORMAL HIGH (ref 70–99)
Glucose-Capillary: 189 mg/dL — ABNORMAL HIGH (ref 70–99)
Glucose-Capillary: 59 mg/dL — ABNORMAL LOW (ref 70–99)
Glucose-Capillary: 191 mg/dL — ABNORMAL HIGH (ref 70–99)
Glucose-Capillary: 111 mg/dL — ABNORMAL HIGH (ref 70–99)

## 2019-03-12 LAB — RAPID URINE DRUG SCREEN, HOSP PERFORMED
Amphetamines: NOT DETECTED
Barbiturates: NOT DETECTED
Benzodiazepines: NOT DETECTED
Cocaine: NOT DETECTED
Opiates: NOT DETECTED
Tetrahydrocannabinol: NOT DETECTED

## 2019-03-12 LAB — ECHOCARDIOGRAM COMPLETE
Height: 65 in
Weight: 3040 oz

## 2019-03-12 LAB — GLUCOSE, CAPILLARY: Glucose-Capillary: 84 mg/dL (ref 70–99)

## 2019-03-12 LAB — SARS CORONAVIRUS 2 (TAT 6-24 HRS): SARS Coronavirus 2: NEGATIVE

## 2019-03-12 MED ORDER — SENNOSIDES-DOCUSATE SODIUM 8.6-50 MG PO TABS
1.0000 | ORAL_TABLET | Freq: Every evening | ORAL | Status: DC | PRN
  Filled 2019-03-12: qty 1

## 2019-03-12 MED ORDER — CITALOPRAM HYDROBROMIDE 20 MG PO TABS
40.0000 mg | ORAL_TABLET | Freq: Every evening | ORAL | Status: DC
  Administered 2019-03-12 – 2019-03-14 (×3): 40 mg via ORAL
  Filled 2019-03-12: qty 4
  Filled 2019-03-12 (×2): qty 2
  Filled 2019-03-12: qty 4
  Filled 2019-03-12 (×2): qty 2
  Filled 2019-03-12: qty 4

## 2019-03-12 MED ORDER — GABAPENTIN 400 MG PO CAPS
800.0000 mg | ORAL_CAPSULE | Freq: Four times a day (QID) | ORAL | Status: DC
  Administered 2019-03-12 – 2019-03-15 (×13): 800 mg via ORAL
  Filled 2019-03-12 (×10): qty 2
  Filled 2019-03-12: qty 8
  Filled 2019-03-12 (×2): qty 2

## 2019-03-12 MED ORDER — ASPIRIN EC 325 MG PO TBEC
325.0000 mg | DELAYED_RELEASE_TABLET | Freq: Every evening | ORAL | Status: DC
  Administered 2019-03-12 – 2019-03-14 (×3): 325 mg via ORAL
  Filled 2019-03-12 (×3): qty 1

## 2019-03-12 MED ORDER — INSULIN ASPART 100 UNIT/ML ~~LOC~~ SOLN
0.0000 [IU] | Freq: Three times a day (TID) | SUBCUTANEOUS | Status: DC
  Administered 2019-03-12: 7 [IU] via SUBCUTANEOUS
  Administered 2019-03-13: 18:00:00 2 [IU] via SUBCUTANEOUS
  Administered 2019-03-13: 13:00:00 3 [IU] via SUBCUTANEOUS
  Administered 2019-03-13: 1 [IU] via SUBCUTANEOUS
  Administered 2019-03-14: 13:00:00 5 [IU] via SUBCUTANEOUS
  Administered 2019-03-15 (×2): 2 [IU] via SUBCUTANEOUS
  Filled 2019-03-12 (×2): qty 1

## 2019-03-12 MED ORDER — IPRATROPIUM-ALBUTEROL 0.5-2.5 (3) MG/3ML IN SOLN: 3.0000 mL | Freq: Four times a day (QID) | RESPIRATORY_TRACT | Status: DC | PRN

## 2019-03-12 MED ORDER — STROKE: EARLY STAGES OF RECOVERY BOOK
Freq: Once | Status: DC
  Filled 2019-03-12: qty 1

## 2019-03-12 MED ORDER — ACETAMINOPHEN 325 MG PO TABS
650.0000 mg | ORAL_TABLET | ORAL | Status: DC | PRN
  Administered 2019-03-12: 02:00:00 650 mg via ORAL
  Filled 2019-03-12: qty 2

## 2019-03-12 MED ORDER — SODIUM CHLORIDE 0.9 % IV SOLN
INTRAVENOUS | Status: DC
  Administered 2019-03-12 – 2019-03-13 (×2): via INTRAVENOUS

## 2019-03-12 MED ORDER — METHOCARBAMOL 500 MG PO TABS
500.0000 mg | ORAL_TABLET | Freq: Two times a day (BID) | ORAL | Status: DC
  Administered 2019-03-12 – 2019-03-15 (×8): 500 mg via ORAL
  Filled 2019-03-12 (×8): qty 1

## 2019-03-12 MED ORDER — ACETAMINOPHEN 160 MG/5ML PO SOLN: 650.0000 mg | ORAL | Status: DC | PRN

## 2019-03-12 MED ORDER — ENOXAPARIN SODIUM 40 MG/0.4ML ~~LOC~~ SOLN
40.0000 mg | SUBCUTANEOUS | Status: DC
  Administered 2019-03-12 – 2019-03-15 (×4): 40 mg via SUBCUTANEOUS
  Filled 2019-03-12 (×4): qty 0.4

## 2019-03-12 MED ORDER — INSULIN REGULAR HUMAN (CONC) 500 UNIT/ML ~~LOC~~ SOPN
180.0000 [IU] | PEN_INJECTOR | Freq: Three times a day (TID) | SUBCUTANEOUS | Status: DC
  Administered 2019-03-12 – 2019-03-13 (×2): 180 [IU] via SUBCUTANEOUS
  Filled 2019-03-12 (×2): qty 3

## 2019-03-12 MED ORDER — PRAVASTATIN SODIUM 20 MG PO TABS: 20.0000 mg | ORAL_TABLET | Freq: Every evening | ORAL | Status: DC

## 2019-03-12 MED ORDER — PRAVASTATIN SODIUM 40 MG PO TABS
40.0000 mg | ORAL_TABLET | Freq: Every evening | ORAL | Status: DC
  Administered 2019-03-12 – 2019-03-14 (×3): 40 mg via ORAL
  Filled 2019-03-12 (×4): qty 1

## 2019-03-12 MED ORDER — ACETAMINOPHEN 650 MG RE SUPP: 650.0000 mg | RECTAL | Status: DC | PRN

## 2019-03-12 MED ORDER — FAMOTIDINE 20 MG PO TABS
20.0000 mg | ORAL_TABLET | Freq: Two times a day (BID) | ORAL | Status: DC
  Administered 2019-03-12 – 2019-03-15 (×7): 20 mg via ORAL
  Filled 2019-03-12: qty 1
  Filled 2019-03-12: qty 2
  Filled 2019-03-12 (×5): qty 1

## 2019-03-12 MED ORDER — BACLOFEN 10 MG PO TABS
20.0000 mg | ORAL_TABLET | Freq: Three times a day (TID) | ORAL | Status: DC
  Administered 2019-03-12 – 2019-03-15 (×10): 20 mg via ORAL
  Filled 2019-03-12 (×10): qty 2

## 2019-03-12 MED ORDER — ALBUTEROL SULFATE HFA 108 (90 BASE) MCG/ACT IN AERS
1.0000 | INHALATION_SPRAY | Freq: Four times a day (QID) | RESPIRATORY_TRACT | Status: DC | PRN
  Filled 2019-03-12: qty 6.7

## 2019-03-12 MED ORDER — OXYCODONE HCL 5 MG PO TABS
10.0000 mg | ORAL_TABLET | ORAL | Status: DC | PRN
  Administered 2019-03-12 – 2019-03-15 (×8): 10 mg via ORAL
  Filled 2019-03-12 (×8): qty 2

## 2019-03-12 MED ORDER — ASPIRIN EC 81 MG PO TBEC: 81.0000 mg | DELAYED_RELEASE_TABLET | Freq: Every evening | ORAL | Status: DC

## 2019-03-12 MED ORDER — INSULIN REGULAR HUMAN (CONC) 500 UNIT/ML ~~LOC~~ SOPN
180.0000 [IU] | PEN_INJECTOR | Freq: Three times a day (TID) | SUBCUTANEOUS | Status: DC
  Filled 2019-03-12: qty 3

## 2019-03-12 NOTE — Progress Notes (Signed)
*  PRELIMINARY RESULTS* Echocardiogram 2D Echocardiogram has been performed.  [name] 03/12/2019, 11:47 AM

## 2019-03-12 NOTE — Progress Notes (Signed)
Called [employer] at [phone] and spoke to [place] so they could verify the model of the lead that is implanted in the patient.  Was told the lead is a [place], model [id number]. [place] shows [id number].  Can contact [phone] to have info faxed.

## 2019-03-12 NOTE — ED Notes (Signed)
Spoke with [facility] will send truck to transfer patient to [facility] at this time.

## 2019-03-12 NOTE — ED Notes (Signed)
Pt given food tray for low glucose.

## 2019-03-12 NOTE — Progress Notes (Signed)
Called to speak to RN who was unavailable at the time. I left a message with the charge RN Estill Bamberg, the pt has a spinal cord stimulator and to complete MRI the pt needs to have a fully charged remote.

## 2019-03-12 NOTE — Progress Notes (Signed)
Patient seen and examined.  Admitted after midnight secondary to left-sided weakness, numbness, blurred vision and facial droop.  No slurred speech.  Vital signs stable.  Risk factors including hypertension, hyperlipidemia, diabetes and prior history of TIA.  Teleneurology consulted recommended admission for TIA/stroke rule out.  Please refer to H&P written by Dr. Darrick Meigs on 03/12/2019 for further info/details.  Plan: -Complete a stroke work-up -Follow lipid panel and A1c -Continue statins and full dose aspirin for now. -Prior to admission patient was using aspirin for secondary prevention; will discuss with neurology the need to transition to Plavix instead versus any other recommendations. -Continue supportive care. -Given lack of neurology or MRI at [facility] during the weekend, patient will be transferred to [facility] for further evaluation and management.  Barton Dubois MD [phone]

## 2019-03-12 NOTE — H&P (Signed)
TRH H&P    Patient Demographics:    [name], is a 53 y.o. female  MRN: [id number]  DOB - 09/01/1965  Admit Date - 03/11/2019  Referring MD/NP/PA: Nanda Quinton  Outpatient Primary MD for the patient is Redmond School, MD  Patient coming from: Home  Chief complaint-facial droop   HPI:    [name]  is a 53 y.o. female, with history of TIA, migraine, multiple sclerosis, hypertension, hyperlipidemia, asthma who came to ED with complaints of left-sided weakness, numbness and blurred vision and left upper extremity weakness.  Patient says the symptoms began around 7:30 PM.  She denies slurred speech, denies chest pain or shortness of breath.  Denies fever or chills. In the ED tele neurology evaluated the patient, she underwent CT head, CTA head and neck which was all unremarkable.  Recommended admission for stroke work-up.    Review of systems:    In addition to the HPI above,   All other systems reviewed and are negative.    Past History of the following :    Past Medical History:  Diagnosis Date  . Asthma   . Chronic abdominal pain   . Chronic neck pain    C4-6 fusion  . CONSTIPATION 05/16/2009   Qualifier: Diagnosis of  By: Ronnald Ramp FNP-BC, Kandice L   . Diabetes mellitus without complication ([place])   . Dysphagia 02/23/2012  . Fatty liver   . GERD (gastroesophageal reflux disease)   . Helicobacter pylori gastritis 2011  . Hyperlipidemia   . Hypertension   . LIVER FUNCTION TESTS, ABNORMAL, HX OF 05/14/2009   Qualifier: Diagnosis of  By: Stephan Minister    . Migraine headache   . MIGRAINE, COMMON 05/14/2009   Qualifier: Diagnosis of  By: Stephan Minister    . MS (multiple sclerosis) ([place])   . Sleep apnea       Past Surgical History:  Procedure Laterality Date  . ABDOMINAL HYSTERECTOMY    . APPENDECTOMY    . BACK SURGERY  03/07/2012   Nerve Stimulator  . CHOLECYSTECTOMY    . COLONOSCOPY   07/09/2009   RMR; normal rectum aside from anal canal hemorrhoids/scattered left-sided diverticula  . ESOPHAGOGASTRODUODENOSCOPY  05/22/2009   RMR; normal /small HH  . ESOPHAGOGASTRODUODENOSCOPY  2007   RMR: non-critical Schatzki's rin, non-maniuplated  . ESOPHAGOGASTRODUODENOSCOPY N/A 10/24/2013   Procedure: ESOPHAGOGASTRODUODENOSCOPY (EGD);  Surgeon: Daneil Dolin, MD;  [facility];  Service: Endoscopy;  Laterality: N/A;  9:45  . ESOPHAGOGASTRODUODENOSCOPY (EGD) WITH ESOPHAGEAL DILATION  03/17/2012   UDJ:SHFWYOVZ appearing esophageal mucosa of uncertain significance. Status post Venia Minks dilation followed by esophageal bx  . MALONEY DILATION N/A 10/24/2013   Procedure: Venia Minks DILATION;  Surgeon: Daneil Dolin, MD;  Location: [facility];  Service: Endoscopy;  Laterality: N/A;  . MANDIBLE SURGERY    . NECK SURGERY     X2, last time 2013      Social History:      Social History   Tobacco Use  . Smoking status: Never Smoker  . Smokeless  tobacco: Never Used  Substance Use Topics  . Alcohol use: No       Family History :     Family History  Problem Relation Age of Onset  . Cancer Mother        unknown type  . Diabetes Father   . Diabetes Sister   . Colon cancer Neg Hx       Home Medications:   Prior to Admission medications   Medication Sig Start Date End Date Taking? Authorizing Provider  albuterol (VENTOLIN HFA) 108 (90 Base) MCG/ACT inhaler Inhale 1-2 puffs into the lungs every 6 (six) hours as needed for wheezing or shortness of breath.  01/02/19  Yes [provider]  aspirin EC 81 MG tablet Take 81 mg by mouth every evening.    Yes [provider]  baclofen (LIORESAL) 20 MG tablet Take 20 mg by mouth 3 (three) times daily.  05/08/14  Yes [provider]  busPIRone (BUSPAR) 10 MG tablet Take 10 mg by mouth daily as needed. For anxiety 08/29/18  Yes [provider]  citalopram (CELEXA) 40 MG tablet Take 40 mg by mouth  every evening.    Yes [provider]  gabapentin (NEURONTIN) 800 MG tablet Take 800 mg by mouth 4 (four) times daily. 01/02/19  Yes [provider]  glipiZIDE (GLUCOTROL XL) 10 MG 24 hr tablet Take 1 tablet (10 mg total) by mouth daily with breakfast. 12/02/18  Yes Nida, Denman George, MD  insulin regular human CONCENTRATED (HUMULIN R U-500 KWIKPEN) 500 UNIT/ML kwikpen Inject 180 Units into the skin 3 (three) times daily with meals. 11/21/18  Yes Nida, Denman George, MD  lisinopril (PRINIVIL,ZESTRIL) 2.5 MG tablet Take 2.5 mg by mouth every evening.    Yes [provider]  metFORMIN (GLUCOPHAGE) 500 MG tablet Take 1,000 mg by mouth 2 (two) times daily.   Yes [provider]  methocarbamol (ROBAXIN) 500 MG tablet Take 1 tablet (500 mg total) by mouth 2 (two) times daily. 01/18/19  Yes Harlene Salts A, PA-C  metoprolol succinate (TOPROL-XL) 25 MG 24 hr tablet Take 1 tablet by mouth 2 (two) times daily. 01/11/19  Yes [provider]  ondansetron (ZOFRAN) 4 MG tablet Take 4 mg by mouth 4 (four) times daily as needed. 03/06/19  Yes [provider]  Oxycodone HCl 10 MG TABS Take 10 mg by mouth every 4 (four) hours as needed (for pain).  01/03/19  Yes [provider]  pravastatin (PRAVACHOL) 20 MG tablet TAKE 1 TABLET BY MOUTH EVERY EVENING. Patient taking differently: Take 20 mg by mouth every evening.  04/26/18  Yes Branch, Dorothe Pea, MD     Allergies:     Allergies  Allergen Reactions  . Peanut-Containing Drug Products Anaphylaxis and Hives    Patient states she is allergic to all nuts  . Shellfish Allergy Anaphylaxis  . Gluten Meal   . Latex Other (See Comments)    Reaction: blistering  . Prednisone Hives and Other (See Comments)    Reaction: causes psychological issues   . Nexium [Esomeprazole Magnesium] Hives  . Tape Rash    Paper tape please     Physical Exam:   Vitals  Blood pressure 135/62, pulse 91, temperature  98.7 F (37.1 C), resp. rate 14, height  (1.651 m), weight 86.2 kg, SpO2 100 %.  1.  General: Appears in no acute distress  2. Psychiatric: Alert, oriented x3, intact insight and judgment  3. Neurologic: Left-sided facial droop,  aggressive cranial nerves grossly intact, motor strength 5/5 in all extremities, sensations reduced on left side of the face, DTRs 2+ bilaterally  4. HEENMT:  Atraumatic normocephalic, extraocular muscles are intact  5. Respiratory : Clear to auscultation bilaterally, no wheezing or crackles auscultated  6. Cardiovascular : S1-S2, regular, no murmur auscultated  7. Gastrointestinal:  Abdominal soft, nontender, no organomegaly      Data Review:    CBC Recent Labs  Lab 03/11/19 2046  WBC 5.7  HGB 12.7  HCT 37.9  PLT 183  MCV 86.5  MCH 29.0  MCHC 33.5  RDW 12.1  LYMPHSABS 2.1  MONOABS 0.4  EOSABS 0.2  BASOSABS 0.0   ------------------------------------------------------------------------------------------------------------------  Results for orders placed or performed during the hospital encounter of 03/11/19 (from the past 48 hour(s))  Ethanol     Status: None   Collection Time: 03/11/19  8:45 PM  Result Value Ref Range   Alcohol, Ethyl (B) <10 <10 mg/dL    Comment: (NOTE) Lowest detectable limit for serum alcohol is 10 mg/dL. For medical purposes only. Performed at [facility], [address] [place], [place] [address]   Protime-INR     Status: None   Collection Time: 03/11/19  8:46 PM  Result Value Ref Range   Prothrombin Time 11.7 11.4 - 15.2 seconds   INR 0.9 0.8 - 1.2    Comment: (NOTE) INR goal varies based on device and disease states. Performed at [facility], [address] [place], [place] [address]   APTT     Status: None   Collection Time: 03/11/19  8:46 PM  Result Value Ref Range   aPTT 25 24 - 36 seconds    Comment: Performed at [facility], [address] [place], [place] [address]  CBC     Status:  None   Collection Time: 03/11/19  8:46 PM  Result Value Ref Range   WBC 5.7 4.0 - 10.5 K/uL   RBC 4.38 3.87 - 5.11 MIL/uL   Hemoglobin 12.7 12.0 - 15.0 g/dL   HCT 86.537.9 78.436.0 - 69.646.0 %   MCV 86.5 80.0 - 100.0 fL   MCH 29.0 26.0 - 34.0 pg   MCHC 33.5 30.0 - 36.0 g/dL   RDW 29.512.1 28.411.5 - 13.215.5 %   Platelets 183 150 - 400 K/uL   nRBC 0.0 0.0 - 0.2 %    Comment: Performed at [facility], [address] [place], [place] [address]  Differential     Status: None   Collection Time: 03/11/19  8:46 PM  Result Value Ref Range   Neutrophils Relative % 53 %   Neutro Abs 3.0 1.7 - 7.7 K/uL   Lymphocytes Relative 37 %   Lymphs Abs 2.1 0.7 - 4.0 K/uL   Monocytes Relative 6 %   Monocytes Absolute 0.4 0.1 - 1.0 K/uL   Eosinophils Relative 3 %   Eosinophils Absolute 0.2 0.0 - 0.5 K/uL   Basophils Relative 1 %   Basophils Absolute 0.0 0.0 - 0.1 K/uL   Immature Granulocytes 0 %   Abs Immature Granulocytes 0.01 0.00 - 0.07 K/uL    Comment: Performed at [facility], [address] [place], [place] [address]  Comprehensive metabolic panel     Status: Abnormal   Collection Time: 03/11/19  8:46 PM  Result Value Ref Range   Sodium 134 (L) 135 - 145 mmol/L   Potassium 3.7 3.5 - 5.1 mmol/L   Chloride 99 98 - 111 mmol/L   CO2 26  22 - 32 mmol/L   Glucose, Bld 437 (H) 70 - 99 mg/dL   BUN 10 6 - 20 mg/dL   Creatinine, Ser 6.21 0.44 - 1.00 mg/dL   Calcium 8.4 (L) 8.9 - 10.3 mg/dL   Total Protein 7.3 6.5 - 8.1 g/dL   Albumin 3.4 (L) 3.5 - 5.0 g/dL   AST 51 (H) 15 - 41 U/L   ALT 61 (H) 0 - 44 U/L   Alkaline Phosphatase 125 38 - 126 U/L   Total Bilirubin 0.2 (L) 0.3 - 1.2 mg/dL   GFR calc non Af Amer >60 >60 mL/min   GFR calc Af Amer >60 >60 mL/min   Anion gap 9 5 - 15    Comment: Performed at [facility], [address], [place], [place] [address]  CBG monitoring, ED     Status: Abnormal   Collection Time: 03/11/19  9:00 PM  Result Value Ref Range   Glucose-Capillary 415 (H) 70 - 99 mg/dL     Chemistries  Recent Labs  Lab 03/11/19 2046  NA 134*  K 3.7  CL 99  CO2 26  GLUCOSE 437*  BUN 10  CREATININE 0.84  CALCIUM 8.4*  AST 51*  ALT 61*  ALKPHOS 125  BILITOT 0.2*   ------------------------------------------------------------------------------------------------------------------  ------------------------------------------------------------------------------------------------------------------ GFR: Estimated Creatinine Clearance: 84 mL/min (by C-G formula based on SCr of 0.84 mg/dL). Liver Function Tests: Recent Labs  Lab 03/11/19 2046  AST 51*  ALT 61*  ALKPHOS 125  BILITOT 0.2*  PROT 7.3  ALBUMIN 3.4*   No results for input(s): LIPASE, AMYLASE in the last 168 hours. No results for input(s): AMMONIA in the last 168 hours. Coagulation Profile: Recent Labs  Lab 03/11/19 2046  INR 0.9   Cardiac Enzymes: No results for input(s): CKTOTAL, CKMB, CKMBINDEX, TROPONINI in the last 168 hours. BNP (last 3 results) No results for input(s): PROBNP in the last 8760 hours. HbA1C: No results for input(s): HGBA1C in the last 72 hours. CBG: Recent Labs  Lab 03/11/19 2100  GLUCAP 415*   Lipid Profile: No results for input(s): CHOL, HDL, LDLCALC, TRIG, CHOLHDL, LDLDIRECT in the last 72 hours. Thyroid Function Tests: No results for input(s): TSH, T4TOTAL, FREET4, T3FREE, THYROIDAB in the last 72 hours. Anemia Panel: No results for input(s): VITAMINB12, FOLATE, FERRITIN, TIBC, IRON, RETICCTPCT in the last 72 hours.  --------------------------------------------------------------------------------------------------------------- Urine analysis:    Component Value Date/Time   COLORURINE STRAW (A) 01/18/2019 1720   APPEARANCEUR CLEAR 01/18/2019 1720   LABSPEC 1.024 01/18/2019 1720   PHURINE 7.0 01/18/2019 1720   GLUCOSEU >=500 (A) 01/18/2019 1720   HGBUR SMALL (A) 01/18/2019 1720   BILIRUBINUR NEGATIVE 01/18/2019 1720   KETONESUR NEGATIVE 01/18/2019 1720    PROTEINUR NEGATIVE 01/18/2019 1720   UROBILINOGEN 1.0 06/26/2014 1720   NITRITE NEGATIVE 01/18/2019 1720   LEUKOCYTESUR NEGATIVE 01/18/2019 1720      Imaging Results:    CT ANGIO HEAD W OR WO CONTRAST  Result Date: 03/11/2019 CLINICAL DATA:  Focal neuro deficit. EXAM: CT ANGIOGRAPHY HEAD AND NECK CT PERFUSION BRAIN TECHNIQUE: Multidetector CT imaging of the head and neck was performed using the standard protocol during bolus administration of intravenous contrast. Multiplanar CT image reconstructions and MIPs were obtained to evaluate the vascular anatomy. Carotid stenosis measurements (when applicable) are obtained utilizing NASCET criteria, using the distal internal carotid diameter as the denominator. Multiphase CT imaging of the brain was performed following IV bolus contrast injection. Subsequent parametric perfusion maps were calculated using RAPID software.  CONTRAST:  115mL OMNIPAQUE IOHEXOL 350 MG/ML SOLN COMPARISON:  CT head 03/11/2019 FINDINGS: CTA NECK FINDINGS Aortic arch: Standard branching. Imaged portion shows no evidence of aneurysm or dissection. No significant stenosis of the major arch vessel origins. Right carotid system: Normal right carotid. Negative for atherosclerotic disease or stenosis Left carotid system: Normal left carotid. Negative for atherosclerotic disease or stenosis. Vertebral arteries: Both vertebral arteries patent to the basilar without stenosis Skeleton: ACDF with pseudarthrosis at C4-5. ACDF with solid fusion C5-6 and C6-7. No acute skeletal abnormality. Other neck: 1 cm right thyroid nodule. Negative for mass or adenopathy. Upper chest: Lung apices clear bilaterally. Review of the MIP images confirms the above findings CTA HEAD FINDINGS Anterior circulation: Cavernous carotid widely patent bilaterally. Anterior and middle cerebral arteries widely patent bilaterally. Posterior circulation: Both vertebral arteries patent to the basilar. Basilar widely patent. PICA,  AICA widely patent. Superior cerebellar and posterior cerebral arteries patent bilaterally without stenosis. Venous sinuses: Normal venous enhancement Anatomic variants: None Review of the MIP images confirms the above findings CT Brain Perfusion Findings: ASPECTS: 10 CBF (<30%) Volume: 0mL Perfusion (Tmax>6.0s) volume: 0mL Mismatch Volume: 0mL Infarction Location:None IMPRESSION: 1. CT perfusion negative for acute infarct or ischemia 2. Negative CTA head and neck. Electronically Signed   By: Marlan Palauharles  Clark M.D.   On: 03/11/2019 21:52   CT ANGIO NECK W OR WO CONTRAST  Result Date: 03/11/2019 CLINICAL DATA:  Focal neuro deficit. EXAM: CT ANGIOGRAPHY HEAD AND NECK CT PERFUSION BRAIN TECHNIQUE: Multidetector CT imaging of the head and neck was performed using the standard protocol during bolus administration of intravenous contrast. Multiplanar CT image reconstructions and MIPs were obtained to evaluate the vascular anatomy. Carotid stenosis measurements (when applicable) are obtained utilizing NASCET criteria, using the distal internal carotid diameter as the denominator. Multiphase CT imaging of the brain was performed following IV bolus contrast injection. Subsequent parametric perfusion maps were calculated using RAPID software. CONTRAST:  115mL OMNIPAQUE IOHEXOL 350 MG/ML SOLN COMPARISON:  CT head 03/11/2019 FINDINGS: CTA NECK FINDINGS Aortic arch: Standard branching. Imaged portion shows no evidence of aneurysm or dissection. No significant stenosis of the major arch vessel origins. Right carotid system: Normal right carotid. Negative for atherosclerotic disease or stenosis Left carotid system: Normal left carotid. Negative for atherosclerotic disease or stenosis. Vertebral arteries: Both vertebral arteries patent to the basilar without stenosis Skeleton: ACDF with pseudarthrosis at C4-5. ACDF with solid fusion C5-6 and C6-7. No acute skeletal abnormality. Other neck: 1 cm right thyroid nodule. Negative for  mass or adenopathy. Upper chest: Lung apices clear bilaterally. Review of the MIP images confirms the above findings CTA HEAD FINDINGS Anterior circulation: Cavernous carotid widely patent bilaterally. Anterior and middle cerebral arteries widely patent bilaterally. Posterior circulation: Both vertebral arteries patent to the basilar. Basilar widely patent. PICA, AICA widely patent. Superior cerebellar and posterior cerebral arteries patent bilaterally without stenosis. Venous sinuses: Normal venous enhancement Anatomic variants: None Review of the MIP images confirms the above findings CT Brain Perfusion Findings: ASPECTS: 10 CBF (<30%) Volume: 0mL Perfusion (Tmax>6.0s) volume: 0mL Mismatch Volume: 0mL Infarction Location:None IMPRESSION: 1. CT perfusion negative for acute infarct or ischemia 2. Negative CTA head and neck. Electronically Signed   By: Marlan Palauharles  Clark M.D.   On: 03/11/2019 21:52   CT CEREBRAL PERFUSION W CONTRAST  Result Date: 03/11/2019 CLINICAL DATA:  Focal neuro deficit. EXAM: CT ANGIOGRAPHY HEAD AND NECK CT PERFUSION BRAIN TECHNIQUE: Multidetector CT imaging of the head and neck was performed using the  standard protocol during bolus administration of intravenous contrast. Multiplanar CT image reconstructions and MIPs were obtained to evaluate the vascular anatomy. Carotid stenosis measurements (when applicable) are obtained utilizing NASCET criteria, using the distal internal carotid diameter as the denominator. Multiphase CT imaging of the brain was performed following IV bolus contrast injection. Subsequent parametric perfusion maps were calculated using RAPID software. CONTRAST:  OMNIPAQUE IOHEXOL 350 MG/ML SOLN COMPARISON:  CT head 03/11/2019 FINDINGS: CTA NECK FINDINGS Aortic arch: Standard branching. Imaged portion shows no evidence of aneurysm or dissection. No significant stenosis of the major arch vessel origins. Right carotid system: Normal right carotid. Negative for  atherosclerotic disease or stenosis Left carotid system: Normal left carotid. Negative for atherosclerotic disease or stenosis. Vertebral arteries: Both vertebral arteries patent to the basilar without stenosis Skeleton: ACDF with pseudarthrosis at C4-5. ACDF with solid fusion C5-6 and C6-7. No acute skeletal abnormality. Other neck: 1 cm right thyroid nodule. Negative for mass or adenopathy. Upper chest: Lung apices clear bilaterally. Review of the MIP images confirms the above findings CTA HEAD FINDINGS Anterior circulation: Cavernous carotid widely patent bilaterally. Anterior and middle cerebral arteries widely patent bilaterally. Posterior circulation: Both vertebral arteries patent to the basilar. Basilar widely patent. PICA, AICA widely patent. Superior cerebellar and posterior cerebral arteries patent bilaterally without stenosis. Venous sinuses: Normal venous enhancement Anatomic variants: None Review of the MIP images confirms the above findings CT Brain Perfusion Findings: ASPECTS: 10 CBF (<30%) Volume: 0mL Perfusion (Tmax>6.0s) volume: 0mL Mismatch Volume: 0mL Infarction Location:None IMPRESSION: 1. CT perfusion negative for acute infarct or ischemia 2. Negative CTA head and neck. Electronically Signed   By: Marlan Palau M.D.   On: 03/11/2019 21:52   CT HEAD CODE STROKE WO CONTRAST  Result Date: 03/11/2019 CLINICAL DATA:  Code stroke. Ataxia. Left facial numbness. Blurred vision. Right-sided weakness. EXAM: CT HEAD WITHOUT CONTRAST TECHNIQUE: Contiguous axial images were obtained from the base of the skull through the vertex without intravenous contrast. COMPARISON:  CT head 01/18/2019 FINDINGS: Brain: No evidence of acute infarction, hemorrhage, hydrocephalus, extra-axial collection or mass lesion/mass effect. Vascular: Negative for hyperdense vessel Skull: Negative Sinuses/Orbits: Negative Other: None ASPECTS (Alberta Stroke Program Early CT Score) - Ganglionic level infarction (caudate,  lentiform nuclei, internal capsule, insula, M1-M3 cortex): 7 - Supraganglionic infarction (M4-M6 cortex): 3 Total score (0-10 with 10 being normal): 10 IMPRESSION: 1. Negative CT head 2. ASPECTS is 10 3. These results were called by telephone at the time of interpretation on 03/11/2019 at 9:00 pm to provider JOSHUA LONG , who verbally acknowledged these results. Electronically Signed   By: Marlan Palau M.D.   On: 03/11/2019 21:00    My personal review of EKG: Rhythm NSR, left axis deviation   Assessment & Plan:    Active Problems:   Facial droop   1. Facial droop-patient has left-sided facial droop, concern for TIA versus stroke.  Initial CT head is negative.  CT head and neck is unremarkable.  Patient will need MRI of brain.  Will transfer patient to [facility].  Also obtain echocardiogram, lipid profile, hemoglobin A1c.  Continue aspirin 81 mg daily, Lipitor 20 mg daily.  2. Diabetes mellitus type 2-patient is on concentrated human regular insulin 180 units 3 times a day with meals.  We will also add sliding scale insulin with NovoLog.  3. Hypertension-we will hold antihypertensive medications including lisinopril, metoprolol for permissive hypertension.  4. Anxiety/depression-continue Celexa  5. History of multiple sclerosis-continue Robaxin, oxycodone as needed, Neurontin,  baclofen.    DVT Prophylaxis-   Lovenox  AM Labs Ordered, also please review Full Orders  Family Communication: Admission, patients condition and plan of care including tests being ordered have been discussed with the patient who indicate understanding and agree with the plan and Code Status.  Code Status: Full code  Admission status: Inpatient: Based on patients clinical presentation and evaluation of above clinical data, I have made determination that patient meets Inpatient criteria at this time.  Time spent in minutes : 60 minutes   Shawnta Zimbelman S Apolonia Ellwood M.D

## 2019-03-12 NOTE — Progress Notes (Signed)
New Admission Note:  Arrival Method: [facility] via stretcher from [facility] ED Mental Orientation: Alert and Oriented Telemetry: Box 30 Assessment: Completed Skin: No skin conditions, clean and dry IV: Right AC infusing NS @75ml /hr Pain: No complaints of pain Safety Measures: Safety Fall Prevention Plan was given, discussed. Admission: Completed 3W: Patient has been orientated to the room, unit and the staff. Family: No family present at bedside at time of admission  Orders have been reviewed and implemented. Will continue to monitor the patient. Call light has been placed within reach and bed alarm has been activated.   Janus Molder ,RN

## 2019-03-13 DIAGNOSIS — I10 Essential (primary) hypertension: Secondary | ICD-10-CM

## 2019-03-13 DIAGNOSIS — G35 Multiple sclerosis: Secondary | ICD-10-CM

## 2019-03-13 DIAGNOSIS — E1165 Type 2 diabetes mellitus with hyperglycemia: Secondary | ICD-10-CM

## 2019-03-13 DIAGNOSIS — Z6835 Body mass index (BMI) 35.0-35.9, adult: Secondary | ICD-10-CM

## 2019-03-13 LAB — GLUCOSE, CAPILLARY
Glucose-Capillary: 127 mg/dL — ABNORMAL HIGH (ref 70–99)
Glucose-Capillary: 209 mg/dL — ABNORMAL HIGH (ref 70–99)
Glucose-Capillary: 190 mg/dL — ABNORMAL HIGH (ref 70–99)
Glucose-Capillary: 59 mg/dL — ABNORMAL LOW (ref 70–99)
Glucose-Capillary: 82 mg/dL (ref 70–99)

## 2019-03-13 LAB — HIV ANTIBODY (ROUTINE TESTING W REFLEX): HIV Screen 4th Generation wRfx: NONREACTIVE

## 2019-03-13 MED ORDER — INSULIN REGULAR HUMAN (CONC) 500 UNIT/ML ~~LOC~~ SOPN
90.0000 [IU] | PEN_INJECTOR | Freq: Three times a day (TID) | SUBCUTANEOUS | Status: DC
  Administered 2019-03-13 – 2019-03-14 (×3): 90 [IU] via SUBCUTANEOUS
  Filled 2019-03-13: qty 3

## 2019-03-13 NOTE — Consult Note (Addendum)
NEURO HOSPITALIST CONSULT NOTE   Requestig physician: Dr. Candelaria Celeste   Reason for Consult: Possible MS exacerbation vs Stroke   History obtained from:  Patient and Chart   HPI:                                                                                                                                          [name] is an 53 y.o. female with a history of TIA, migraine, multiple sclerosis, hypertension, hyperlipidemia, diabetes mellitus type 2, anxiety and asthma. She has a medtronic spinal cord stimulator that is inactive. She lives at home with her husband and was independent in activities of daily living. She was also able to drive. She is not currently on any medications for her Multiple Sclerosis, but has taken Rebiff and Copaxone in the past. She reports that she does have a neurologist in [facility], Dr. Gavin Potters. She said that her neurologist is aware that she is no longer taking copaxone, and that they plan to reevaluate options at her next visit, which has been pushed due to COVID-19. She has been off Copaxone for nearly a year.   She presented to [facility] with complaints acute left face weakness, blurred vision and right upper extremity weakness. Code Stroke tele consult was called. Initial NIHSS 6 for partial hemanopia, facial asymmetry, bilateral upper extremity and left lower extremity drift, and decreased sensation. Head CT was negative. Last known well 1500 on 03/11/19 per teleneurology note. She was outside of the tPA window. CTA no large vessel occlusion.   She was admitted for further stroke work-up and transferred to [facility] for MRI. MRI has not been completed related to concerns regarding MRI and spinal cord stimulator. The patient reports the stimulator is not active after she fell on it several years ago. She has been asked to find the remote that is needed to complete MRI and she has family looking.  Echocardiogram has been  completed. She has also been seen by the Diabetes RN Coordinator. Patient has been assessed by PT/OT and a nurse case manager, recommendations are being made for home health services.   Past Medical History:  Diagnosis Date  . Asthma   . Chronic abdominal pain   . Chronic neck pain    C4-6 fusion  . CONSTIPATION 05/16/2009   Qualifier: Diagnosis of  By: Yetta Barre FNP-BC, Kandice L   . Diabetes mellitus without complication (HCC)   . Dysphagia 02/23/2012  . Fatty liver   . GERD (gastroesophageal reflux disease)   . Helicobacter pylori gastritis 2011  . Hyperlipidemia   . Hypertension   . LIVER FUNCTION TESTS, ABNORMAL, HX OF 05/14/2009   Qualifier: Diagnosis of  By: Ricard Dillon    . Migraine headache   .  MIGRAINE, COMMON 05/14/2009   Qualifier: Diagnosis of  By: Stephan Minister    . MS (multiple sclerosis) ([place])   . Sleep apnea     Past Surgical History:  Procedure Laterality Date  . ABDOMINAL HYSTERECTOMY    . APPENDECTOMY    . BACK SURGERY  03/07/2012   Nerve Stimulator  . CHOLECYSTECTOMY    . COLONOSCOPY  07/09/2009   RMR; normal rectum aside from anal canal hemorrhoids/scattered left-sided diverticula  . ESOPHAGOGASTRODUODENOSCOPY  05/22/2009   RMR; normal /small HH  . ESOPHAGOGASTRODUODENOSCOPY  2007   RMR: non-critical Schatzki's rin, non-maniuplated  . ESOPHAGOGASTRODUODENOSCOPY N/A 10/24/2013   Procedure: ESOPHAGOGASTRODUODENOSCOPY (EGD);  Surgeon: Daneil Dolin, MD;  [facility]: [facility];  Service: Endoscopy;  Laterality: N/A;  9:45  . ESOPHAGOGASTRODUODENOSCOPY (EGD) WITH ESOPHAGEAL DILATION  03/17/2012   TIW:[id number] appearing esophageal mucosa of uncertain significance. Status post Venia Minks dilation followed by esophageal bx  . MALONEY DILATION N/A 10/24/2013   Procedure: Venia Minks DILATION;  Surgeon: Daneil Dolin, MD;  Location: [facility];  Service: Endoscopy;  Laterality: N/A;  . MANDIBLE SURGERY    . NECK SURGERY     X2, last time 2013    Family  History  Problem Relation Age of Onset  . Cancer Mother        unknown type  . Diabetes Father   . Diabetes Sister   . Colon cancer Neg Hx        Social History:  reports that she has never smoked. She has never used smokeless tobacco. She reports that she does not drink alcohol or use drugs.  Allergies  Allergen Reactions  . Peanut-Containing Drug Products Anaphylaxis and Hives    Patient states she is allergic to all nuts  . Shellfish Allergy Anaphylaxis  . Gluten Meal   . Latex Other (See Comments)    Reaction: blistering  . Prednisone Hives and Other (See Comments)    Reaction: causes psychological issues   . Nexium [Esomeprazole Magnesium] Hives  . Tape Rash    Paper tape please    MEDICATIONS:                                                                                                                     I have reviewed the patient's current medications.   ROS:  History obtained from the patient  General ROS: negative for - chills, fatigue, fever, night sweats, weight gain or weight loss Psychological ROS: negative for - behavioral disorder, hallucinations, memory difficulties, mood swings or suicidal ideation Ophthalmic ROS: negative for - blurry vision, double vision, eye pain or loss of vision, prior to this episode and admisson ENT ROS: negative for - epistaxis, nasal discharge, oral lesions, sore throat, tinnitus or vertigo Allergy and Immunology ROS: negative for - hives or itchy/watery eyes Hematological and Lymphatic ROS: negative for - bleeding problems, bruising or swollen lymph nodes Respiratory ROS: negative for - cough, hemoptysis, shortness of breath or wheezing. She reports some asthma with weather changes, but none recently.  Cardiovascular ROS: negative for - chest pain, dyspnea on exertion, edema or irregular  heartbeat Gastrointestinal ROS: negative for - abdominal pain, diarrhea, hematemesis, nausea/vomiting or stool incontinence Genito-Urinary ROS: negative for - dysuria, hematuria, incontinence or urinary frequency/urgency Musculoskeletal ROS: negative for - joint swelling or muscular weakness Neurological ROS: as noted in HPI Dermatological ROS: negative for rash and skin lesion changes  Blood pressure 136/63, pulse 85, temperature 98 F (36.7 C), temperature source Oral, resp. rate 16, height 5\' 5"  (1.651 m), weight 86.2 kg, SpO2 96 %.   General Examination:                                                                                                       Physical Exam  HEENT-  Normocephalic, no lesions, without obvious abnormality.  Normal external eye and conjunctiva.    Lungs-no excessive working breathing.  Saturations within normal limits Abdomen- All 4 quadrants palpated and nontender Extremities- Warm, dry and intact Musculoskeletal-no joint tenderness, deformity or swelling Skin-warm and dry  Neurological Examination Mental Status: Alert, oriented, thought content appropriate.  Speech fluency fluctuated during conversation. No difficulty with naming.  Able to follow 3 step commands without difficulty. Cranial Nerves: II: decreased left peripheral vision. Reported blurriness out of left eye only.  III,IV, VI: ptosis not present, extra-ocular motions intact bilaterally pupils equal, round, reactive to light and accommodation V,VII: left facial weakness, reports decreased sensation to light touch on left face VIII: hearing normal bilaterally IX,X: uvula rises symmetrically XI: bilateral shoulder shrug XII: midline tongue extension Motor: Bilateral upper extremities 5/5. Slight drift to right upper extremity.  Left lower extremity 4/5. Right lower extremity 5/5 Tone and bulk:normal tone throughout; no atrophy noted Sensory: light touch decreased left arm and  leg Cerebellar: normal finger-to-nose, difficulty with heel to shin when using left leg   Lab Results: Basic Metabolic Panel: Recent Labs  Lab 03/11/19 2046  NA 134*  K 3.7  CL 99  CO2 26  GLUCOSE 437*  BUN 10  CREATININE 0.84  CALCIUM 8.4*    CBC: Recent Labs  Lab 03/11/19 2046  WBC 5.7  NEUTROABS 3.0  HGB 12.7  HCT 37.9  MCV 86.5  PLT 183    Cardiac Enzymes: No results for input(s): CKTOTAL, CKMB, CKMBINDEX, TROPONINI in the last 168 hours.  Lipid Panel: Recent Labs  Lab 03/12/19 [phone]  CHOL 146  TRIG 105  HDL 44  CHOLHDL 3.3  VLDL 21  LDLCALC 81    Imaging: CT ANGIO HEAD W OR WO CONTRAST  Result Date: 03/11/2019 CLINICAL DATA:  Focal neuro deficit. EXAM: CT ANGIOGRAPHY HEAD AND NECK CT PERFUSION BRAIN TECHNIQUE: Multidetector CT imaging of the head and neck was performed using the standard protocol during bolus administration of intravenous contrast. Multiplanar CT image reconstructions and MIPs were obtained to evaluate the vascular anatomy. Carotid stenosis measurements (when applicable) are obtained utilizing NASCET criteria, using the distal internal carotid diameter as the denominator. Multiphase CT imaging of the brain was performed following IV bolus contrast injection. Subsequent parametric perfusion maps were calculated using RAPID software. CONTRAST:  115mL OMNIPAQUE IOHEXOL 350 MG/ML SOLN COMPARISON:  CT head 03/11/2019 FINDINGS: CTA NECK FINDINGS Aortic arch: Standard branching. Imaged portion shows no evidence of aneurysm or dissection. No significant stenosis of the major arch vessel origins. Right carotid system: Normal right carotid. Negative for atherosclerotic disease or stenosis Left carotid system: Normal left carotid. Negative for atherosclerotic disease or stenosis. Vertebral arteries: Both vertebral arteries patent to the basilar without stenosis Skeleton: ACDF with pseudarthrosis at C4-5. ACDF with solid fusion C5-6 and C6-7. No acute  skeletal abnormality. Other neck: 1 cm right thyroid nodule. Negative for mass or adenopathy. Upper chest: Lung apices clear bilaterally. Review of the MIP images confirms the above findings CTA HEAD FINDINGS Anterior circulation: Cavernous carotid widely patent bilaterally. Anterior and middle cerebral arteries widely patent bilaterally. Posterior circulation: Both vertebral arteries patent to the basilar. Basilar widely patent. PICA, AICA widely patent. Superior cerebellar and posterior cerebral arteries patent bilaterally without stenosis. Venous sinuses: Normal venous enhancement Anatomic variants: None Review of the MIP images confirms the above findings CT Brain Perfusion Findings: ASPECTS: 10 CBF (<30%) Volume: 0mL Perfusion (Tmax>6.0s) volume: 0mL Mismatch Volume: 0mL Infarction Location:None IMPRESSION: 1. CT perfusion negative for acute infarct or ischemia 2. Negative CTA head and neck. Electronically Signed   By: Marlan Palauharles  Clark M.D.   On: 03/11/2019 21:52   DG Chest 2 View  Result Date: 03/12/2019 CLINICAL DATA:  Left-sided weakness EXAM: CHEST - 2 VIEW COMPARISON:  01/18/2019 FINDINGS: Cardiac shadow is stable. Spinal stimulator and cervical spine surgery are seen. The lungs are clear. No sizable effusion or infiltrate is noted. No bony abnormality is seen. IMPRESSION: No active cardiopulmonary disease. Electronically Signed   By: Alcide CleverMark  Lukens M.D.   On: 03/12/2019 01:42   CT ANGIO NECK W OR WO CONTRAST  Result Date: 03/11/2019 CLINICAL DATA:  Focal neuro deficit. EXAM: CT ANGIOGRAPHY HEAD AND NECK CT PERFUSION BRAIN TECHNIQUE: Multidetector CT imaging of the head and neck was performed using the standard protocol during bolus administration of intravenous contrast. Multiplanar CT image reconstructions and MIPs were obtained to evaluate the vascular anatomy. Carotid stenosis measurements (when applicable) are obtained utilizing NASCET criteria, using the distal internal carotid diameter as the  denominator. Multiphase CT imaging of the brain was performed following IV bolus contrast injection. Subsequent parametric perfusion maps were calculated using RAPID software. CONTRAST:  115mL OMNIPAQUE IOHEXOL 350 MG/ML SOLN COMPARISON:  CT head 03/11/2019 FINDINGS: CTA NECK FINDINGS Aortic arch: Standard branching. Imaged portion shows no evidence of aneurysm or dissection. No significant stenosis of the major arch vessel origins. Right carotid system: Normal right carotid. Negative for atherosclerotic disease or stenosis Left carotid system: Normal left carotid. Negative for atherosclerotic disease or stenosis. Vertebral arteries: Both vertebral arteries patent to the basilar without stenosis  Skeleton: ACDF with pseudarthrosis at C4-5. ACDF with solid fusion C5-6 and C6-7. No acute skeletal abnormality. Other neck: 1 cm right thyroid nodule. Negative for mass or adenopathy. Upper chest: Lung apices clear bilaterally. Review of the MIP images confirms the above findings CTA HEAD FINDINGS Anterior circulation: Cavernous carotid widely patent bilaterally. Anterior and middle cerebral arteries widely patent bilaterally. Posterior circulation: Both vertebral arteries patent to the basilar. Basilar widely patent. PICA, AICA widely patent. Superior cerebellar and posterior cerebral arteries patent bilaterally without stenosis. Venous sinuses: Normal venous enhancement Anatomic variants: None Review of the MIP images confirms the above findings CT Brain Perfusion Findings: ASPECTS: 10 CBF (<30%) Volume: 0mL Perfusion (Tmax>6.0s) volume: 0mL Mismatch Volume: 0mL Infarction Location:None IMPRESSION: 1. CT perfusion negative for acute infarct or ischemia 2. Negative CTA head and neck. Electronically Signed   By: Marlan Palau M.D.   On: 03/11/2019 21:52   CT CEREBRAL PERFUSION W CONTRAST  Result Date: 03/11/2019 CLINICAL DATA:  Focal neuro deficit. EXAM: CT ANGIOGRAPHY HEAD AND NECK CT PERFUSION BRAIN TECHNIQUE:  Multidetector CT imaging of the head and neck was performed using the standard protocol during bolus administration of intravenous contrast. Multiplanar CT image reconstructions and MIPs were obtained to evaluate the vascular anatomy. Carotid stenosis measurements (when applicable) are obtained utilizing NASCET criteria, using the distal internal carotid diameter as the denominator. Multiphase CT imaging of the brain was performed following IV bolus contrast injection. Subsequent parametric perfusion maps were calculated using RAPID software. CONTRAST:  OMNIPAQUE IOHEXOL 350 MG/ML SOLN COMPARISON:  CT head 03/11/2019 FINDINGS: CTA NECK FINDINGS Aortic arch: Standard branching. Imaged portion shows no evidence of aneurysm or dissection. No significant stenosis of the major arch vessel origins. Right carotid system: Normal right carotid. Negative for atherosclerotic disease or stenosis Left carotid system: Normal left carotid. Negative for atherosclerotic disease or stenosis. Vertebral arteries: Both vertebral arteries patent to the basilar without stenosis Skeleton: ACDF with pseudarthrosis at C4-5. ACDF with solid fusion C5-6 and C6-7. No acute skeletal abnormality. Other neck: 1 cm right thyroid nodule. Negative for mass or adenopathy. Upper chest: Lung apices clear bilaterally. Review of the MIP images confirms the above findings CTA HEAD FINDINGS Anterior circulation: Cavernous carotid widely patent bilaterally. Anterior and middle cerebral arteries widely patent bilaterally. Posterior circulation: Both vertebral arteries patent to the basilar. Basilar widely patent. PICA, AICA widely patent. Superior cerebellar and posterior cerebral arteries patent bilaterally without stenosis. Venous sinuses: Normal venous enhancement Anatomic variants: None Review of the MIP images confirms the above findings CT Brain Perfusion Findings: ASPECTS: 10 CBF (<30%) Volume: 0mL Perfusion (Tmax>6.0s) volume: 0mL Mismatch  Volume: 0mL Infarction Location:None IMPRESSION: 1. CT perfusion negative for acute infarct or ischemia 2. Negative CTA head and neck. Electronically Signed   By: Marlan Palau M.D.   On: 03/11/2019 21:52   ECHOCARDIOGRAM COMPLETE  Result Date: 03/12/2019   ECHOCARDIOGRAM REPORT   Patient Name:   [name] Date of Exam: 03/12/2019 Medical Rec #:  [id number]   Height:       65.0 in Accession #:    [id number]  Weight:       190.0 lb Date of Birth:  Sep 18, 1965   BSA:          1.94 m Patient Age:    53 years    BP:           136/76 mmHg Patient Gender: F           HR:  80 bpm. [facility] Procedure: 2D Echo Indications:    TIA 435.9 / G45.9  History:        Patient has prior history of Echocardiogram examinations, most                 recent 08/26/2016. Abnormal ECG, Signs/Symptoms:Chest Pain; Risk                 Factors:Hypertension, Dyslipidemia and Diabetes. Facial droop,                 multiple sclerosis.  Sonographer:    [name] RDCS (AE) Referring Phys: Gomez Cleverly LAMA IMPRESSIONS  1. Left ventricular ejection fraction, by visual estimation, is 60 to 65%. The left ventricle has normal function. There is mildly increased left ventricular hypertrophy.  2. Left ventricular diastolic parameters are consistent with Grade I diastolic dysfunction (impaired relaxation).  3. The left ventricle has no regional wall motion abnormalities.  4. Global right ventricle has normal systolic function.The right ventricular size is normal. No increase in right ventricular wall thickness.  5. Left atrial size was normal.  6. Right atrial size was normal.  7. The mitral valve is normal in structure. No evidence of mitral valve regurgitation. No evidence of mitral stenosis.  8. The tricuspid valve is normal in structure. Tricuspid valve regurgitation is not demonstrated.  9. The aortic valve is tricuspid. Aortic valve regurgitation is not visualized. No evidence of aortic valve sclerosis or  stenosis. 10. The pulmonic valve was not well visualized. Pulmonic valve regurgitation is not visualized. 11. The inferior vena cava is normal in size with greater than 50% respiratory variability, suggesting right atrial pressure of 3 mmHg. FINDINGS  Left Ventricle: Left ventricular ejection fraction, by visual estimation, is 60 to 65%. The left ventricle has normal function. The left ventricle has no regional wall motion abnormalities. There is mildly increased left ventricular hypertrophy. Left ventricular diastolic parameters are consistent with Grade I diastolic dysfunction (impaired relaxation). Normal left atrial pressure. Right Ventricle: The right ventricular size is normal. No increase in right ventricular wall thickness. Global RV systolic function is has normal systolic function. Left Atrium: Left atrial size was normal in size. Right Atrium: Right atrial size was normal in size Pericardium: There is no evidence of pericardial effusion. Mitral Valve: The mitral valve is normal in structure. No evidence of mitral valve regurgitation. No evidence of mitral valve stenosis by observation. Tricuspid Valve: The tricuspid valve is normal in structure. Tricuspid valve regurgitation is not demonstrated. Aortic Valve: The aortic valve is tricuspid. Aortic valve regurgitation is not visualized. The aortic valve is structurally normal, with no evidence of sclerosis or stenosis. Aortic valve mean gradient measures 5.0 mmHg. Aortic valve peak gradient measures 7.8 mmHg. Aortic valve area, by VTI measures 2.63 cm. Pulmonic Valve: The pulmonic valve was not well visualized. Pulmonic valve regurgitation is not visualized. Pulmonic regurgitation is not visualized. No evidence of pulmonic stenosis. Aorta: The aortic root is normal in size and structure. Pulmonary Artery: Indeterminate PASP, inadequate TR jet. Venous: The inferior vena cava is normal in size with greater than 50% respiratory variability, suggesting right  atrial pressure of 3 mmHg. IAS/Shunts: No atrial level shunt detected by color flow Doppler.  LEFT VENTRICLE PLAX 2D LVIDd:         4.14 cm  Diastology LVIDs:         2.60 cm  LV e' lateral:   7.29 cm/s LV PW:  1.25 cm  LV E/e' lateral: 8.1 LV IVS:        1.31 cm  LV e' medial:    6.31 cm/s LVOT diam:     2.25 cm  LV E/e' medial:  9.3 LV SV:         51 ml LV SV Index:   25.44 LVOT Area:     3.98 cm  RIGHT VENTRICLE RV S prime:     16.20 cm/s TAPSE (M-mode): 2.3 cm LEFT ATRIUM             Index       RIGHT ATRIUM           Index LA diam:        3.00 cm 1.55 cm/m  RA Area:     13.70 cm LA Vol (A2C):   35.7 ml 18.45 ml/m RA Volume:   34.30 ml  17.72 ml/m LA Vol (A4C):   53.3 ml 27.54 ml/m LA Biplane Vol: 44.8 ml 23.15 ml/m  AORTIC VALVE AV Area (Vmax):    2.77 cm AV Area (Vmean):   2.36 cm AV Area (VTI):     2.63 cm AV Vmax:           140.08 cm/s AV Vmean:          108.645 cm/s AV VTI:            0.264 m AV Peak Grad:      7.8 mmHg AV Mean Grad:      5.0 mmHg LVOT Vmax:         97.50 cm/s LVOT Vmean:        64.402 cm/s LVOT VTI:          0.175 m LVOT/AV VTI ratio: 0.66  AORTA Ao Root diam: 2.80 cm MITRAL VALVE MV Area (PHT): 3.27 cm             SHUNTS MV PHT:        67.28 msec           Systemic VTI:  0.17 m MV Decel Time: 232 msec             Systemic Diam: 2.25 cm MV E velocity: 58.70 cm/s 103 cm/s MV A velocity: 67.40 cm/s 70.3 cm/s MV E/A ratio:  0.87       1.5  Dina RichJonathan Branch MD Electronically signed by Dina RichJonathan Branch MD Signature Date/Time: 03/12/2019/11:54:23 AM    Final    CT HEAD CODE STROKE WO CONTRAST  Result Date: 03/11/2019 CLINICAL DATA:  Code stroke. Ataxia. Left facial numbness. Blurred vision. Right-sided weakness. EXAM: CT HEAD WITHOUT CONTRAST TECHNIQUE: Contiguous axial images were obtained from the base of the skull through the vertex without intravenous contrast. COMPARISON:  CT head 01/18/2019 FINDINGS: Brain: No evidence of acute infarction, hemorrhage, hydrocephalus,  extra-axial collection or mass lesion/mass effect. Vascular: Negative for hyperdense vessel Skull: Negative Sinuses/Orbits: Negative Other: None ASPECTS (Alberta Stroke Program Early CT Score) - Ganglionic level infarction (caudate, lentiform nuclei, internal capsule, insula, M1-M3 cortex): 7 - Supraganglionic infarction (M4-M6 cortex): 3 Total score (0-10 with 10 being normal): 10 IMPRESSION: 1. Negative CT head 2. ASPECTS is 10 3. These results were called by telephone at the time of interpretation on 03/11/2019 at 9:00 pm to provider JOSHUA LONG , who verbally acknowledged these results. Electronically Signed   By: Marlan Palauharles  Clark M.D.   On: 03/11/2019 21:00    03/13/2019, 3:59 PM   Assessment 53 y.o. female with a history  of TIA, migraine, multiple sclerosis, hypertension, hyperlipidemia, diabetes mellitus type 2, anxiety, inactive spinal cord stimulator and asthma. Not currently on medications for multiple sclerosis.   Presented to [facility] on 03/11[facility]19 with acute left facial weakness, right arm weakness and blurred vision. CT Head, CTA Head/Neck and CTP were negative for acute stroke and large vessel occlusion. She was transferred to [facility] for MRI. Her multiple stroke risk factors are being managed by the hospitalists. PT/OT are recommended home health therapy. Brain MRI is still pending.   03/11/19 CT Head: Negative, no acute findings CTA Head and Neck: No occlusion or stenosis. ASPECTS 10 CTP: Negative for acute infarct.  03/12/19 Echo: EF 60-65%, no cardiac source of embolism identified  LDL 81 o HBA1C 14.7   Impression: Multiple Sclerosis Flare versus Stroke: MRI Pending Stroke Risk Factors: Hypertension, Hyperlipidemia, Diabetes Mellitus Type   Recommendations: -- Brain MRI with and without contrast : Pending to evaluate for acute stroke versus multiple sclerosis flare.  -- Uncontrolled Type 2 Diabetes being managed by hospitalists and acute diabetes RN  Coordinator also provided recommendations. Currently on novolog and humulin R.  -- Dyslipidemia - currently on Pravastatin  daily -- Currently Aspirin  daily is ordered. Patient reports she was taking aspirin  daily prior to admission, unclear why dose was increased.  -- Hypertension: Currently permissive hypertension and medications on hold. -- Multiple Sclerosis - not currently on any medications. Patient is planning to follow-up with her neurologist in [facility], Dr. Gavin Potters after the new year to discuss MS treatment options.    Cathrine Muster DNP, FNP-C [facility]   Further recommendations to follow from attending neurologist.    NEUROHOSPITALIST ADDENDUM Performed a face to face diagnostic evaluation.   I have reviewed the contents of history and physical exam as documented by PA/ARNP/Resident and agree with above documentation.  I have discussed and formulated the above plan as documented. Edits to the note have been made as needed.  Patient presents to [facility] due to sudden onset left facial droop, as well as left side numbness.  Also feels weak on the left as well as the right. Transferred to [facility] for possible stroke.  Given a history of MS diagnosis not compliant with medication, differential also includes MS exacerbation.  She also has a history of migraines and states that when symptoms occur she did have associated headache and so that could also be on the differential.  Patient states that the reason why she is not on Copaxone was she was supposed to be switched but due to COVID-19 her appointment got postponed. On examination, she has subjective left-sided numbness of the face arm and leg.  Has slight weakness in the left lower extremity.  Patient complains of the right extremity weakness however not apparent on my exam.  Overall impression likely small lacunar infarct given patient's uncontrolled stroke risk factors such as  diabetes, obesity, hypertension hyperlipidemia vs MS exacerbation due to noncompliance vs complicated migraine.  Plan MRI brain with and without contrast, however having issues due to spinal cord stimulator. If we cannot obtain MRI brain by tomorrow, I still think we should continue aspirin and presume this as a stroke, and advised expedited follow-up to resume MS medications as this could be MS exacerbation.  Her symptoms are mild and I do not think she requires IV steroids.  She currently is not complaining of a headache and therefore we did not give need to give a migraine  cocktail.   Of note, regarding her diagnosis of MS, per Dr. Marylyn Ishihara note   Even though brain imaging is not significantly compelling, the changes were likely consistent with demyelination in the context of her clinical course. She had a lumbar puncture at the time of her diagnosis many years ago but is unsure of the result. But she recalls "failing" Evoked Potentials.   She underwent surveillance brain MRI (01/2012) which revealed: "non specific evidence of multiple sclerosis. There is a scattering of punctate T2 hyperintensities within the periventricular and subcortical white matter unusual for the patient's age but entirely nonspecific in appearance. No enhancement or restriction of diffusion. Abnormal vascular structures on the medial aspect of the left occipital horn most likely conform to the expected appearance of an anomaly of venous drainage, but the presence of a tangle of other vessels associated with this raises the question of a tiny micro-AVM. This could be pursued further with a CT angiogram."      Kizzi Overbey MD [facility] [phone]   If 7pm to 7am, please call on call as listed on AMION.

## 2019-03-13 NOTE — Progress Notes (Signed)
Per RN Estill Bamberg, patient does not know where her remote for SCS is. [employer] SCS model [phone] and lead model # [id number] implanted, confirmed per [facility] with [employer], requires a remote to ensure settings of stimulator prior to MRI. MRI cannot be performed without remote.

## 2019-03-13 NOTE — Evaluation (Signed)
Physical Therapy Evaluation Patient Details Name: [name] MRN: [id number] DOB: [birth year] Today's Date: 03/13/2019   History of Present Illness  [name]  is a 53 y.o. female, with history of TIA, migraine, multiple sclerosis, hypertension, hyperlipidemia, asthma who came to ED with complaints of left-sided weakness, numbness and blurred vision and left upper extremity weakness. Initial head CT neg, has been unable to have an MRI.   Clinical Impression  Pt admitted with above diagnosis. Pt presents with word finding deficits as well as L visual field cut. Pt is independent with bed mobility. She needed min A for transfers and min-guard A with RW for gait due to LLE weakness.  Pt currently with functional limitations due to the deficits listed below (see PT Problem List). Pt will benefit from skilled PT to increase their independence and safety with mobility to allow discharge to the venue listed below.       Follow Up Recommendations Home health PT;Supervision for mobility/OOB    Equipment Recommendations  Rolling walker with 5" wheels    Recommendations for Other Services       Precautions / Restrictions Precautions Precautions: None Restrictions Weight Bearing Restrictions: No      Mobility  Bed Mobility Overal bed mobility: Modified Independent             General bed mobility comments: pt sitting cross legged in bed, able to reposition and come to EOB as she desired  Transfers Overall transfer level: Needs assistance Equipment used: Rolling walker (2 wheeled) Transfers: Sit to/from Stand Sit to Stand: Min guard;+2 safety/equipment         General transfer comment: min-guard for safety as pt felt that L knee was mildly buckling  Ambulation/Gait Ambulation/Gait assistance: Min guard;+2 safety/equipment;Min assist Gait Distance (Feet): 75 Feet Assistive device: Rolling walker (2 wheeled);1 person hand held assist Gait Pattern/deviations: Step-through  pattern;Decreased weight shift to left Gait velocity: decreased Gait velocity interpretation: <1.8 ft/sec, indicate of risk for recurrent falls General Gait Details: pt with safe use of RW, equal foot clearance R and L. Practiced ambulation without RW and needed min A (25%) on L side, much more steady and confident with use of RW  Stairs            Wheelchair Mobility    Modified Rankin (Stroke Patients Only) Modified Rankin (Stroke Patients Only) Pre-Morbid Rankin Score: No symptoms Modified Rankin: Moderately severe disability     Balance Overall balance assessment: Needs assistance Sitting-balance support: No upper extremity supported Sitting balance-Leahy Scale: Normal     Standing balance support: Single extremity supported Standing balance-Leahy Scale: Poor Standing balance comment: reliant on UE support                             Pertinent Vitals/Pain Pain Assessment: 0-10 Pain Score: 10-Worst pain ever Pain Location: headache Pain Descriptors / Indicators: Headache Pain Intervention(s): Patient requesting pain meds-RN notified;RN gave pain meds during session    Home Living Family/patient expects to be discharged to:: Private residence Living Arrangements: Spouse/significant other Available Help at Discharge: Family Type of Home: House Home Access: Stairs to enter Entrance Stairs-Rails: None Technical brewer of Steps: 2 Home Layout: One level Home Equipment: None      Prior Function Level of Independence: Independent         Comments: husband does grocery shopping;she was driving     Hand Dominance   Dominant Hand: Right  Extremity/Trunk Assessment   Upper Extremity Assessment Upper Extremity Assessment: Defer to OT evaluation    Lower Extremity Assessment Lower Extremity Assessment: LLE deficits/detail;RLE deficits/detail RLE Deficits / Details: hip flex 3+/5, knee ext 3+/5, knee flex 3+/5 RLE Sensation: WNL RLE  Coordination: WNL LLE Deficits / Details: hip flex 2/5, knee ext 3-/5, knee flex 3-/5 LLE Sensation: WNL LLE Coordination: decreased gross motor    Cervical / Trunk Assessment Cervical / Trunk Assessment: Normal  Communication   Communication: Expressive difficulties  Cognition Arousal/Alertness: Awake/alert Behavior During Therapy: WFL for tasks assessed/performed Overall Cognitive Status: Impaired/Different from baseline Area of Impairment: Problem solving                             Problem Solving: Slow processing;Requires verbal cues General Comments: word finding difficulties, needed extra time to follow multistep cues      General Comments General comments (skin integrity, edema, etc.): L visual field cut, pt compensated well during gait with head turn    Exercises     Assessment/Plan    PT Assessment Patient needs continued PT services  PT Problem List Decreased strength;Decreased range of motion;Decreased activity tolerance;Decreased balance;Decreased mobility;Decreased coordination;Decreased knowledge of use of DME;Decreased knowledge of precautions;Pain       PT Treatment Interventions DME instruction;Gait training;Stair training;Functional mobility training;Therapeutic activities;Therapeutic exercise;Balance training;Neuromuscular re-education;Cognitive remediation;Patient/family education    PT Goals (Current goals can be found in the Care Plan section)  Acute Rehab PT Goals Patient Stated Goal: return home PT Goal Formulation: With patient Time For Goal Achievement: 03/27/19 Potential to Achieve Goals: Good    Frequency Min 4X/week   Barriers to discharge        Co-evaluation PT/OT/SLP Co-Evaluation/Treatment: Yes Reason for Co-Treatment: Complexity of the patient's impairments (multi-system involvement);Necessary to address cognition/behavior during functional activity PT goals addressed during session: Mobility/safety with  mobility;Balance;Proper use of DME;Strengthening/ROM         AM-PAC PT "6 Clicks" Mobility  Outcome Measure Help needed turning from your back to your side while in a flat bed without using bedrails?: None Help needed moving from lying on your back to sitting on the side of a flat bed without using bedrails?: None Help needed moving to and from a bed to a chair (including a wheelchair)?: A Little Help needed standing up from a chair using your arms (e.g., wheelchair or bedside chair)?: A Little Help needed to walk in hospital room?: A Little Help needed climbing 3-5 steps with a railing? : A Little 6 Click Score: 20    End of Session Equipment Utilized During Treatment: Gait belt Activity Tolerance: Patient tolerated treatment well Patient left: in bed;with call bell/phone within reach Nurse Communication: Mobility status PT Visit Diagnosis: Unsteadiness on feet (R26.81);Difficulty in walking, not elsewhere classified (R26.2);Pain Pain - part of body: (head)    Time: 4709-6283 PT Time Calculation (min) (ACUTE ONLY): 23 min   Charges:   PT Evaluation $PT Eval Moderate Complexity: 1 Mod          Lyanne Co, PT  Acute Rehab Services  Pager [phone] Office [phone]   Lawana Chambers Tabia Landowski 03/13/2019, 2:06 PM

## 2019-03-13 NOTE — Progress Notes (Signed)
Inpatient Diabetes Program Recommendations  AACE/ADA: New Consensus Statement on Inpatient Glycemic Control (2015)  Target Ranges:  Prepandial:   less than 140 mg/dL      Peak postprandial:   less than 180 mg/dL (1-2 hours)      Critically ill patients:  140 - 180 mg/dL   Lab Results  Component Value Date   GLUCAP 127 (H) 03/13/2019   HGBA1C 14.7 (H) 03/12/2019    Review of Glycemic Control Results for Debbie, [name] (MRN [id number]) as of 03/13/2019 11:24  Ref. Range 03/12/2019 14:12 03/12/2019 15:03 03/12/2019 17:49 03/12/2019 21:13 03/13/2019 06:19  Glucose-Capillary Latest Ref Range: 70 - 99 mg/dL 59 (L) 191 (H) 111 (H) 84 127 (H)   Diabetes history: DM2 Outpatient Diabetes medications: U 500 insulin 180 units tid + Glucotrol 10 mg qd + Metformin 1 gm bid Current orders for Inpatient glycemic control: U 500 180 units tid + Novolog sensitive tid  Inpatient Diabetes Program Recommendations:   Patient had office visit with Dr. Dorris Fetch (endocrinologist) on 12/20/18 and chart states patient continued to drink a large amount of sweetened drinks @ home. -While in the hospital, decrease U 500 to 50% home dose= 90 units tid   Patient received lunch time dose of U 500 yesterday and other doses were held. Will follow while in the hospital.  Thank you, Nani Gasser. Lashaya Kienitz, RN, MSN, CDE  Diabetes Coordinator Inpatient Glycemic Control Team Team Pager [phone] (8am-5pm) 03/13/2019 11:28 AM

## 2019-03-13 NOTE — Evaluation (Signed)
Speech Language Pathology Evaluation Patient Details Name: [name] MRN: [id number] DOB: [birth year] Today's Date: 03/13/2019 Time: 1287-8676 SLP Time Calculation (min) (ACUTE ONLY): 15 min  Problem List:  Patient Active Problem List   Diagnosis Date Noted  . Facial droop 03/12/2019  . Vitamin D deficiency 08/26/2017  . Uncontrolled type 2 diabetes mellitus with hyperglycemia (HCC) 03/15/2017  . Essential hypertension, benign 03/15/2017  . Class 2 severe obesity due to excess calories with serious comorbidity and body mass index (BMI) of 35.0 to 35.9 in adult (HCC) 03/15/2017  . Spondylosis of thoracic region without myelopathy or radiculopathy 07/02/2014  . Abdominal pain, epigastric 10/19/2013  . Unspecified constipation 10/19/2013  . Chest pain 04/16/2012  . Abnormal EKG 04/16/2012  . HTN (hypertension) 04/16/2012  . Mixed hyperlipidemia 04/16/2012  . Multiple sclerosis (HCC) 04/16/2012  . 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 Dysphagia 02/23/2012  . FATTY LIVER DISEASE 05/16/2009  . HELICOBACTER PYLORI GASTRITIS 05/14/2009  . ASTHMA 05/14/2009  . Esophageal reflux 05/14/2009  . NAUSEA 05/14/2009  . Diarrhea 05/14/2009  . DEPRESSION, HX OF 05/14/2009   Past Medical History:  Past Medical History:  Diagnosis Date  . Asthma   . Chronic abdominal pain   . Chronic neck pain    C4-6 fusion  . CONSTIPATION 05/16/2009   Qualifier: Diagnosis of  By: Yetta Barre FNP-BC, Kandice L   . Diabetes mellitus without complication (HCC)   . Dysphagia 02/23/2012  . Fatty liver   . GERD (gastroesophageal reflux disease)   . Helicobacter pylori gastritis 2011  . Hyperlipidemia   . Hypertension   . LIVER FUNCTION TESTS, ABNORMAL, HX OF 05/14/2009   Qualifier: Diagnosis of  By: Ricard Dillon    . Migraine headache   . MIGRAINE, COMMON 05/14/2009   Qualifier: Diagnosis of  By: Ricard Dillon    . MS (multiple sclerosis) (HCC)   . Sleep apnea    Past  Surgical History:  Past Surgical History:  Procedure Laterality Date  . ABDOMINAL HYSTERECTOMY    . APPENDECTOMY    . BACK SURGERY  03/07/2012   Nerve Stimulator  . CHOLECYSTECTOMY    . COLONOSCOPY  07/09/2009   RMR; normal rectum aside from anal canal hemorrhoids/scattered left-sided diverticula  . ESOPHAGOGASTRODUODENOSCOPY  05/22/2009   RMR; normal /small HH  . ESOPHAGOGASTRODUODENOSCOPY  2007   RMR: non-critical Schatzki's rin, non-maniuplated  . ESOPHAGOGASTRODUODENOSCOPY N/A 10/24/2013   Procedure: ESOPHAGOGASTRODUODENOSCOPY (EGD);  Surgeon: Corbin Ade, MD;  Location: [facility];  Service: Endoscopy;  Laterality: N/A;  9:45  . ESOPHAGOGASTRODUODENOSCOPY (EGD) WITH ESOPHAGEAL DILATION  03/17/2012   [facility]:NOBSJGGE appearing esophageal mucosa of uncertain significance. Status post [name] dilation followed by esophageal bx  . MALONEY DILATION N/A 10/24/2013   Procedure: [name] DILATION;  Surgeon: Corbin Ade, MD;  Location: [facility];  Service: Endoscopy;  Laterality: N/A;  . MANDIBLE SURGERY    . NECK SURGERY     X2, last time 2013   HPI:  [name]  is a 53 y.o. female, with history of TIA, migraine, multiple sclerosis, hypertension, hyperlipidemia, asthma who came to ED with complaints of left-sided weakness, numbness and blurred vision and left upper extremity weakness.  Patient says the symptoms began around 7:30 PM.  She denies slurred speech, denies chest pain or shortness of breath.  Denies fever or chills.  Pt not on any MS medications, states she has been compliant w DM and antihypertensive medications lately.  She has taken MS  meds Rebiff and Copaxone in the past, nothing recently, pt falls frequently, but does not use cane/walker. She underwent CT head, CTA head and neck and CT perfusion which were all unremarkable.  She was transferred to [facility] for further work-up of possible stroke.    Assessment / Plan / Recommendation Clinical Impression  Pt presents with  functional cognitive abilities. She is oriented x 4, able to demonstrate selective attention to tasks in moderately distracting environment, demonstrate good recall of immediate information and recall of medical history as well as recall of current medicines. She is able to demosntrate complex problem sovling and displays good anticipatory awareness. Per pt report, she has mild deficits in memory and she reports a very chaotic home environment. These deficits were present during today's assessment but this writer provided pt with handout on compensatory memory strategies that might be helpful within home environment. She was able to read the strategies aloud and also able to provide ways to execute these strategies within her home. Should any memory deficits continue at discharge, would recommend HHST to target within home and community environment. ST to sign off in the acute setting.     SLP Assessment  SLP Recommendation/Assessment: Patient does not need any further Speech Lanaguage Pathology Services SLP Visit Diagnosis: Cognitive communication deficit (R41.841)    Follow Up Recommendations  Home health SLP(if memory deficits persist)    Frequency and Duration   N/A        SLP Evaluation Cognition  Overall Cognitive Status: Within Functional Limits for tasks assessed Arousal/Alertness: Awake/alert Orientation Level: Oriented X4       Comprehension  Auditory Comprehension Overall Auditory Comprehension: Appears within functional limits for tasks assessed Visual Recognition/Discrimination Discrimination: Within Function Limits Reading Comprehension Reading Status: Within funtional limits    Expression Expression Primary Mode of Expression: Verbal Verbal Expression Overall Verbal Expression: Appears within functional limits for tasks assessed Written Expression Dominant Hand: Right Written Expression: Not tested   Oral / Motor  Oral Motor/Sensory Function Overall Oral  Motor/Sensory Function: Within functional limits Motor Speech Overall Motor Speech: Appears within functional limits for tasks assessed Respiration: Within functional limits Phonation: Normal   GO                    Kawon Willcutt 03/13/2019, 11:43 AM

## 2019-03-13 NOTE — Progress Notes (Signed)
[facility] PROGRESS NOTE  TIANE SZYDLOWSKI EAV:[id number] DOB: 24-Jun-1965 DOA: 03/11/2019 PCP: Elfredia Nevins, MD  Brief Summary: [name]  is a 53 y.o. female, with history of TIA, migraine, multiple sclerosis, hypertension, hyperlipidemia, asthma who came to ED with complaints of left-sided weakness, numbness and blurred vision and left upper extremity weakness.  Patient says the symptoms began around 7:30 PM.  She denies slurred speech, denies chest pain or shortness of breath.  Denies fever or chills. In the ED tele neurology evaluated the patient, she underwent CT head, CTA head and neck which was all unremarkable.  Recommended admission for stroke work-up.  Assessment/Plan: Active Problems:   HTN (hypertension)   Multiple sclerosis (HCC)   Uncontrolled type 2 diabetes mellitus with hyperglycemia (HCC)   Essential hypertension, benign   Class 2 severe obesity due to excess calories with serious comorbidity and body mass index (BMI) of 35.0 to 35.9 in adult [facility])   Facial droop   Facial Droop, RUE, LLE weakness  CT negative  PT and OT evaluated pt - persistent weakness.  Consult neurology - recommended MRI w&wo contrast to help r/o MS flare  Patient does have spinal cord stimulator, although she reports that this is not active after she fell on it several years ago.  Will check with radiology to see if we can do the MRI.  Permissive hypertension  Echocardiogram done -normal (other than grade 1 diastolic dysfunction)  Lipid panel done (LDL 81)  Hemoglobin A1c done: 14.7  Poorly controlled diabetes with hyperglycemia  Patient had episode of hypoglycemia yesterday  We will decrease U5 100 insulin to 90 units nightly  Continue sliding scale and CBGs  HTN  Permissive hypertension  Holding antihypertensives.  MS  Not currently on treatment  Class 2 Obesity  Asthma  Stable  GERD with h/o h Pylori gastritis.  Continue PPI    Code Status: Full  code Family Communication:  Disposition Plan: Per PT/OT patient should be able to return home with rehab.   Consultants:  neuro  Procedures:  none  Antibiotics: none  HPI/Subjective: Patient still feels weak, although feels somewhat improved.  She thinks that this is different than her MS flare.  Objective: Vitals:   03/13/19 0813 03/13/19 1210  BP: (!) 139/58 136/63  Pulse: 88 85  Resp: 16 16  Temp: 97.8 F (36.6 C) 98 F (36.7 C)  SpO2: 97% 96%    Intake/Output Summary (Last 24 hours) at 03/13/2019 1236 Last data filed at 03/13/2019 0906 Gross per 24 hour  Intake 1721.25 ml  Output --  Net 1721.25 ml   Filed Weights   03/11/19 2040  Weight: 86.2 kg    Exam:   General: A&O x3, no acute distress  Cardiovascular: Rate, normal S1-S2 sounds.  No murmurs.  Respiratory: Clear to auscultation bilaterally with no wheezes rales or rhonchi.  Abdomen: Soft, nontender, nondistended with appropriate bowel sounds  Musculoskeletal: Nontender joints. No contractures  Extremity: Warm, 2+ dorsalis pedis and radial pulses.  Data Reviewed: Basic Metabolic Panel: Recent Labs  Lab 03/11/19 2046  NA 134*  K 3.7  CL 99  CO2 26  GLUCOSE 437*  BUN 10  CREATININE 0.84  CALCIUM 8.4*   Liver Function Tests: Recent Labs  Lab 03/11/19 2046  AST 51*  ALT 61*  ALKPHOS 125  BILITOT 0.2*  PROT 7.3  ALBUMIN 3.4*   No results for input(s): LIPASE, AMYLASE in the last 168 hours. No results for input(s): AMMONIA in the last  168 hours. CBC: Recent Labs  Lab 03/11/19 2046  WBC 5.7  NEUTROABS 3.0  HGB 12.7  HCT 37.9  MCV 86.5  PLT 183   Cardiac Enzymes: No results for input(s): CKTOTAL, CKMB, CKMBINDEX, TROPONINI in the last 168 hours. BNP (last 3 results) No results for input(s): BNP in the last 8760 hours.  ProBNP (last 3 results) No results for input(s): PROBNP in the last 8760 hours.  CBG: Recent Labs  Lab 03/12/19 1503 03/12/19 1749  03/12/19 2113 03/13/19 0619 03/13/19 1209  GLUCAP 191* 111* 84 127* 209*    Recent Results (from the past 240 hour(s))  SARS CORONAVIRUS 2 (TAT 6-24 HRS) Nasopharyngeal Nasopharyngeal Swab     Status: None   Collection Time: 03/11/19 10:32 PM   Specimen: Nasopharyngeal Swab  Result Value Ref Range Status   SARS Coronavirus 2 NEGATIVE NEGATIVE Final    Comment: (NOTE) SARS-CoV-2 target nucleic acids are NOT DETECTED. The SARS-CoV-2 RNA is generally detectable in upper and lower respiratory specimens during the acute phase of infection. Negative results do not preclude SARS-CoV-2 infection, do not rule out co-infections with other pathogens, and should not be used as the sole basis for treatment or other patient management decisions. Negative results must be combined with clinical observations, patient history, and epidemiological information. The expected result is Negative. Fact Sheet for Patients: HairSlick.nohttps://www.fda.gov/media/138098/download Fact Sheet for Healthcare Providers: quierodirigir.comhttps://www.fda.gov/media/138095/download This test is not yet approved or cleared by the [place] FDA and  has been authorized for detection and/or diagnosis of SARS-CoV-2 by FDA under an Emergency Use Authorization (EUA). This EUA will remain  in effect (meaning this test can be used) for the duration of the COVID-19 declaration under Section 56 4(b)(1) of the Act, 21 U.S.C. section 360bbb-3(b)(1), unless the authorization is terminated or revoked sooner. Performed at [facility], [address] [place], [place] [address]      Studies: CT ANGIO HEAD W OR WO CONTRAST  Result Date: 03/11/2019 CLINICAL DATA:  Focal neuro deficit. EXAM: CT ANGIOGRAPHY HEAD AND NECK CT PERFUSION BRAIN TECHNIQUE: Multidetector CT imaging of the head and neck was performed using the standard protocol during bolus administration of intravenous contrast. Multiplanar CT image reconstructions and MIPs were obtained  to evaluate the vascular anatomy. Carotid stenosis measurements (when applicable) are obtained utilizing NASCET criteria, using the distal internal carotid diameter as the denominator. Multiphase CT imaging of the brain was performed following IV bolus contrast injection. Subsequent parametric perfusion maps were calculated using RAPID software. CONTRAST:  115mL OMNIPAQUE IOHEXOL 350 MG/ML SOLN COMPARISON:  CT head 03/11/2019 FINDINGS: CTA NECK FINDINGS Aortic arch: Standard branching. Imaged portion shows no evidence of aneurysm or dissection. No significant stenosis of the major arch vessel origins. Right carotid system: Normal right carotid. Negative for atherosclerotic disease or stenosis Left carotid system: Normal left carotid. Negative for atherosclerotic disease or stenosis. Vertebral arteries: Both vertebral arteries patent to the basilar without stenosis Skeleton: ACDF with pseudarthrosis at C4-5. ACDF with solid fusion C5-6 and C6-7. No acute skeletal abnormality. Other neck: 1 cm right thyroid nodule. Negative for mass or adenopathy. Upper chest: Lung apices clear bilaterally. Review of the MIP images confirms the above findings CTA HEAD FINDINGS Anterior circulation: Cavernous carotid widely patent bilaterally. Anterior and middle cerebral arteries widely patent bilaterally. Posterior circulation: Both vertebral arteries patent to the basilar. Basilar widely patent. PICA, AICA widely patent. Superior cerebellar and posterior cerebral arteries patent bilaterally without stenosis. Venous sinuses: Normal venous enhancement Anatomic variants:  None Review of the MIP images confirms the above findings CT Brain Perfusion Findings: ASPECTS: 10 CBF (<30%) Volume: 0mL Perfusion (Tmax>6.0s) volume: 0mL Mismatch Volume: 0mL Infarction Location:None IMPRESSION: 1. CT perfusion negative for acute infarct or ischemia 2. Negative CTA head and neck. Electronically Signed   By: Marlan Palauharles  Clark M.D.   On: 03/11/2019 21:52    DG Chest 2 View  Result Date: 03/12/2019 CLINICAL DATA:  Left-sided weakness EXAM: CHEST - 2 VIEW COMPARISON:  01/18/2019 FINDINGS: Cardiac shadow is stable. Spinal stimulator and cervical spine surgery are seen. The lungs are clear. No sizable effusion or infiltrate is noted. No bony abnormality is seen. IMPRESSION: No active cardiopulmonary disease. Electronically Signed   By: Alcide CleverMark  Lukens M.D.   On: 03/12/2019 01:42   CT ANGIO NECK W OR WO CONTRAST  Result Date: 03/11/2019 CLINICAL DATA:  Focal neuro deficit. EXAM: CT ANGIOGRAPHY HEAD AND NECK CT PERFUSION BRAIN TECHNIQUE: Multidetector CT imaging of the head and neck was performed using the standard protocol during bolus administration of intravenous contrast. Multiplanar CT image reconstructions and MIPs were obtained to evaluate the vascular anatomy. Carotid stenosis measurements (when applicable) are obtained utilizing NASCET criteria, using the distal internal carotid diameter as the denominator. Multiphase CT imaging of the brain was performed following IV bolus contrast injection. Subsequent parametric perfusion maps were calculated using RAPID software. CONTRAST:  115mL OMNIPAQUE IOHEXOL 350 MG/ML SOLN COMPARISON:  CT head 03/11/2019 FINDINGS: CTA NECK FINDINGS Aortic arch: Standard branching. Imaged portion shows no evidence of aneurysm or dissection. No significant stenosis of the major arch vessel origins. Right carotid system: Normal right carotid. Negative for atherosclerotic disease or stenosis Left carotid system: Normal left carotid. Negative for atherosclerotic disease or stenosis. Vertebral arteries: Both vertebral arteries patent to the basilar without stenosis Skeleton: ACDF with pseudarthrosis at C4-5. ACDF with solid fusion C5-6 and C6-7. No acute skeletal abnormality. Other neck: 1 cm right thyroid nodule. Negative for mass or adenopathy. Upper chest: Lung apices clear bilaterally. Review of the MIP images confirms the above  findings CTA HEAD FINDINGS Anterior circulation: Cavernous carotid widely patent bilaterally. Anterior and middle cerebral arteries widely patent bilaterally. Posterior circulation: Both vertebral arteries patent to the basilar. Basilar widely patent. PICA, AICA widely patent. Superior cerebellar and posterior cerebral arteries patent bilaterally without stenosis. Venous sinuses: Normal venous enhancement Anatomic variants: None Review of the MIP images confirms the above findings CT Brain Perfusion Findings: ASPECTS: 10 CBF (<30%) Volume: 0mL Perfusion (Tmax>6.0s) volume: 0mL Mismatch Volume: 0mL Infarction Location:None IMPRESSION: 1. CT perfusion negative for acute infarct or ischemia 2. Negative CTA head and neck. Electronically Signed   By: Marlan Palauharles  Clark M.D.   On: 03/11/2019 21:52   CT CEREBRAL PERFUSION W CONTRAST  Result Date: 03/11/2019 CLINICAL DATA:  Focal neuro deficit. EXAM: CT ANGIOGRAPHY HEAD AND NECK CT PERFUSION BRAIN TECHNIQUE: Multidetector CT imaging of the head and neck was performed using the standard protocol during bolus administration of intravenous contrast. Multiplanar CT image reconstructions and MIPs were obtained to evaluate the vascular anatomy. Carotid stenosis measurements (when applicable) are obtained utilizing NASCET criteria, using the distal internal carotid diameter as the denominator. Multiphase CT imaging of the brain was performed following IV bolus contrast injection. Subsequent parametric perfusion maps were calculated using RAPID software. CONTRAST:  115mL OMNIPAQUE IOHEXOL 350 MG/ML SOLN COMPARISON:  CT head 03/11/2019 FINDINGS: CTA NECK FINDINGS Aortic arch: Standard branching. Imaged portion shows no evidence of aneurysm or dissection. No significant stenosis of the major  arch vessel origins. Right carotid system: Normal right carotid. Negative for atherosclerotic disease or stenosis Left carotid system: Normal left carotid. Negative for atherosclerotic disease  or stenosis. Vertebral arteries: Both vertebral arteries patent to the basilar without stenosis Skeleton: ACDF with pseudarthrosis at C4-5. ACDF with solid fusion C5-6 and C6-7. No acute skeletal abnormality. Other neck: 1 cm right thyroid nodule. Negative for mass or adenopathy. Upper chest: Lung apices clear bilaterally. Review of the MIP images confirms the above findings CTA HEAD FINDINGS Anterior circulation: Cavernous carotid widely patent bilaterally. Anterior and middle cerebral arteries widely patent bilaterally. Posterior circulation: Both vertebral arteries patent to the basilar. Basilar widely patent. PICA, AICA widely patent. Superior cerebellar and posterior cerebral arteries patent bilaterally without stenosis. Venous sinuses: Normal venous enhancement Anatomic variants: None Review of the MIP images confirms the above findings CT Brain Perfusion Findings: ASPECTS: 10 CBF (<30%) Volume: 43mL Perfusion (Tmax>6.0s) volume: 38mL Mismatch Volume: 50mL Infarction Location:None IMPRESSION: 1. CT perfusion negative for acute infarct or ischemia 2. Negative CTA head and neck. Electronically Signed   By: Marlan Palau M.D.   On: 03/11/2019 21:52   ECHOCARDIOGRAM COMPLETE  Result Date: 03/12/2019   ECHOCARDIOGRAM REPORT   Patient Name:   LAQUIESHA PIACENTE Date of Exam: 03/12/2019 Medical Rec #:  [id number]   Height:       65.0 in Accession #:    [id number]  Weight:       190.0 lb Date of Birth:  13-Dec-1965   BSA:          1.94 m Patient Age:    53 years    BP:           136/76 mmHg Patient Gender: F           HR:           80 bpm. [facility]am [facility] Procedure: 2D Echo Indications:    TIA 435.9 / G45.9  History:        Patient has prior history of Echocardiogram examinations, most                 recent 08/26/2016. Abnormal ECG, Signs/Symptoms:Chest Pain; Risk                 Factors:Hypertension, Dyslipidemia and Diabetes. Facial droop,                 multiple sclerosis.  Sonographer:    [name]  RDCS (AE) Referring Phys: Gomez Cleverly LAMA IMPRESSIONS  1. Left ventricular ejection fraction, by visual estimation, is 60 to 65%. The left ventricle has normal function. There is mildly increased left ventricular hypertrophy.  2. Left ventricular diastolic parameters are consistent with Grade I diastolic dysfunction (impaired relaxation).  3. The left ventricle has no regional wall motion abnormalities.  4. Global right ventricle has normal systolic function.The right ventricular size is normal. No increase in right ventricular wall thickness.  5. Left atrial size was normal.  6. Right atrial size was normal.  7. The mitral valve is normal in structure. No evidence of mitral valve regurgitation. No evidence of mitral stenosis.  8. The tricuspid valve is normal in structure. Tricuspid valve regurgitation is not demonstrated.  9. The aortic valve is tricuspid. Aortic valve regurgitation is not visualized. No evidence of aortic valve sclerosis or stenosis. 10. The pulmonic valve was not well visualized. Pulmonic valve regurgitation is not visualized. 11. The inferior vena cava is normal in size with greater than  50% respiratory variability, suggesting right atrial pressure of 3 mmHg. FINDINGS  Left Ventricle: Left ventricular ejection fraction, by visual estimation, is 60 to 65%. The left ventricle has normal function. The left ventricle has no regional wall motion abnormalities. There is mildly increased left ventricular hypertrophy. Left ventricular diastolic parameters are consistent with Grade I diastolic dysfunction (impaired relaxation). Normal left atrial pressure. Right Ventricle: The right ventricular size is normal. No increase in right ventricular wall thickness. Global RV systolic function is has normal systolic function. Left Atrium: Left atrial size was normal in size. Right Atrium: Right atrial size was normal in size Pericardium: There is no evidence of pericardial effusion. Mitral Valve: The mitral  valve is normal in structure. No evidence of mitral valve regurgitation. No evidence of mitral valve stenosis by observation. Tricuspid Valve: The tricuspid valve is normal in structure. Tricuspid valve regurgitation is not demonstrated. Aortic Valve: The aortic valve is tricuspid. Aortic valve regurgitation is not visualized. The aortic valve is structurally normal, with no evidence of sclerosis or stenosis. Aortic valve mean gradient measures 5.0 mmHg. Aortic valve peak gradient measures 7.8 mmHg. Aortic valve area, by VTI measures 2.63 cm. Pulmonic Valve: The pulmonic valve was not well visualized. Pulmonic valve regurgitation is not visualized. Pulmonic regurgitation is not visualized. No evidence of pulmonic stenosis. Aorta: The aortic root is normal in size and structure. Pulmonary Artery: Indeterminate PASP, inadequate TR jet. Venous: The inferior vena cava is normal in size with greater than 50% respiratory variability, suggesting right atrial pressure of 3 mmHg. IAS/Shunts: No atrial level shunt detected by color flow Doppler.  LEFT VENTRICLE PLAX 2D LVIDd:         4.14 cm  Diastology LVIDs:         2.60 cm  LV e' lateral:   7.29 cm/s LV PW:         1.25 cm  LV E/e' lateral: 8.1 LV IVS:        1.31 cm  LV e' medial:    6.31 cm/s LVOT diam:     2.25 cm  LV E/e' medial:  9.3 LV SV:         51 ml LV SV Index:   25.44 LVOT Area:     3.98 cm  RIGHT VENTRICLE RV S prime:     16.20 cm/s TAPSE (M-mode): 2.3 cm LEFT ATRIUM             Index       RIGHT ATRIUM           Index LA diam:        3.00 cm 1.55 cm/m  RA Area:     13.70 cm LA Vol (A2C):   35.7 ml 18.45 ml/m RA Volume:   34.30 ml  17.72 ml/m LA Vol (A4C):   53.3 ml 27.54 ml/m LA Biplane Vol: 44.8 ml 23.15 ml/m  AORTIC VALVE AV Area (Vmax):    2.77 cm AV Area (Vmean):   2.36 cm AV Area (VTI):     2.63 cm AV Vmax:           140.08 cm/s AV Vmean:          108.645 cm/s AV VTI:            0.264 m AV Peak Grad:      7.8 mmHg AV Mean Grad:      5.0 mmHg  LVOT Vmax:         97.50 cm/s LVOT Vmean:  64.402 cm/s LVOT VTI:          0.175 m LVOT/AV VTI ratio: 0.66  AORTA Ao Root diam: 2.80 cm MITRAL VALVE MV Area (PHT): 3.27 cm             SHUNTS MV PHT:        67.28 msec           Systemic VTI:  0.17 m MV Decel Time: 232 msec             Systemic Diam: 2.25 cm MV E velocity: 58.70 cm/s 103 cm/s MV A velocity: 67.40 cm/s 70.3 cm/s MV E/A ratio:  0.87       1.5  Dina Rich MD Electronically signed by Dina Rich MD Signature Date/Time: 03/12/2019/11:54:23 AM    Final    CT HEAD CODE STROKE WO CONTRAST  Result Date: 03/11/2019 CLINICAL DATA:  Code stroke. Ataxia. Left facial numbness. Blurred vision. Right-sided weakness. EXAM: CT HEAD WITHOUT CONTRAST TECHNIQUE: Contiguous axial images were obtained from the base of the skull through the vertex without intravenous contrast. COMPARISON:  CT head 01/18/2019 FINDINGS: Brain: No evidence of acute infarction, hemorrhage, hydrocephalus, extra-axial collection or mass lesion/mass effect. Vascular: Negative for hyperdense vessel Skull: Negative Sinuses/Orbits: Negative Other: None ASPECTS (Alberta Stroke Program Early CT Score) - Ganglionic level infarction (caudate, lentiform nuclei, internal capsule, insula, M1-M3 cortex): 7 - Supraganglionic infarction (M4-M6 cortex): 3 Total score (0-10 with 10 being normal): 10 IMPRESSION: 1. Negative CT head 2. ASPECTS is 10 3. These results were called by telephone at the time of interpretation on 03/11/2019 at 9:00 pm to provider JOSHUA LONG , who verbally acknowledged these results. Electronically Signed   By: Marlan Palau M.D.   On: 03/11/2019 21:00    Scheduled Meds: .  stroke: mapping our early stages of recovery book   Does not apply Once  . aspirin EC  325 mg Oral QPM  . baclofen  20 mg Oral TID  . citalopram  40 mg Oral QPM  . enoxaparin (LOVENOX) injection  40 mg Subcutaneous Q24H  . famotidine  20 mg Oral BID  . gabapentin  800 mg Oral QID  .  insulin aspart  0-9 Units Subcutaneous TID WC  . insulin regular human CONCENTRATED  180 Units Subcutaneous TID WC  . methocarbamol  500 mg Oral BID  . pravastatin  40 mg Oral QPM   Continuous Infusions: . sodium chloride 75 mL/hr at 03/12/19 0217      Time spent: 25    Levie Heritage   03/13/2019, 12:36 PM  LOS: 1 day

## 2019-03-13 NOTE — TOC Initial Note (Signed)
Transition of Care [facility]) - Initial/Assessment Note    Patient Details  Name: [name] MRN: [id number] Date of Birth: 08/08/1965  [facility] CM/SW Contact:    Pollie Friar, RN Phone Number: 03/13/2019, 3:34 PM  Clinical Narrative:                 Awaiting MRI result. Recommendations are for [facility] services. CM will provide choice. CM following.  Expected Discharge Plan: [place] Barriers to Discharge: Continued Medical Work up   Patient Goals and CMS Choice   CMS Medicare.gov Compare Post Acute Care list provided to:: Patient Choice offered to / list presented to : Patient  Expected Discharge Plan and Services Expected Discharge Plan: [place]   Discharge Planning Services: CM Consult Post Acute Care Choice: [place] arrangements for the past 2 months: Single Family Home                                      Prior Living Arrangements/Services Living arrangements for the past 2 months: Single Family Home Lives with:: Adult Children, Spouse Patient language and need for interpreter reviewed:: Yes Do you feel safe going back to the place where you live?: Yes      Need for Family Participation in Patient Care: Yes (Comment)(intermittent supervision) Care giver support system in place?: Yes (comment)(spouse and son)   Criminal Activity/Legal Involvement Pertinent to Current Situation/Hospitalization: No - Comment as needed  Activities of Daily Living      Permission Sought/Granted                  Emotional Assessment Appearance:: Appears stated age Attitude/Demeanor/Rapport: Engaged Affect (typically observed): Accepting, Pleasant Orientation: : Oriented to Self, Oriented to Place, Oriented to  Time, Oriented to Situation   Psych Involvement: No (comment)  Admission diagnosis:  TIA (transient ischemic attack) [G45.9] Facial droop [R29.810] Stroke-like symptoms [R29.90] Patient Active  Problem List   Diagnosis Date Noted  . Facial droop 03/12/2019  . Vitamin D deficiency 08/26/2017  . Uncontrolled type 2 diabetes mellitus with hyperglycemia ([place]) 03/15/2017  . Essential hypertension, benign 03/15/2017  . Class 2 severe obesity due to excess calories with serious comorbidity and body mass index (BMI) of 35.0 to 35.9 in adult ([place]) 03/15/2017  . Spondylosis of thoracic region without myelopathy or radiculopathy 07/02/2014  . Abdominal pain, epigastric 10/19/2013  . Unspecified constipation 10/19/2013  . Chest pain 04/16/2012  . Abnormal EKG 04/16/2012  . HTN (hypertension) 04/16/2012  . Mixed hyperlipidemia 04/16/2012  . Multiple sclerosis ([place]) 04/16/2012  . 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 Dysphagia 02/23/2012  . FATTY LIVER DISEASE 05/16/2009  . HELICOBACTER PYLORI GASTRITIS 05/14/2009  . ASTHMA 05/14/2009  . Esophageal reflux 05/14/2009  . NAUSEA 05/14/2009  . Diarrhea 05/14/2009  . DEPRESSION, HX OF 05/14/2009   PCP:  Redmond School, MD Pharmacy:   [facility] [place], [address] [place] [address] Phone: [phone] Fax: [phone]  [id number] [place], [address] [place] [address] Phone: [phone] Fax: [place], [address] [place] [address] Phone: [phone] Fax: [phone]  Social Determinants of Health (SDOH) Interventions    Readmission Risk Interventions No flowsheet data found.

## 2019-03-13 NOTE — Progress Notes (Signed)
Occupational Therapy Evaluation Patient Details Name: [name] MRN: [id number] DOB: 1965-05-01 Today's Date: 03/13/2019    History of Present Illness [name]  is a 53 y.o. female, with history of TIA, migraine, multiple sclerosis, hypertension, hyperlipidemia, asthma who came to ED with complaints of left-sided weakness, numbness and blurred vision and left upper extremity weakness. Initial head CT neg, has been unable to have an MRI.    Clinical Impression   PTA, pt was living at home with her husband, and reports she was independent with ADL/IADL and functional mobility, pt was driving, she does not work. Pt currently requires minguard+2 for safety during functional mobility at RW level. Pt required minguard during ADL completion. She demonstrates word finding difficulties and a left visual field deficit. She also demonstrates RUE weakness. Due to decline in current level of function, pt would benefit from acute OT to address established goals to facilitate safe D/C to venue listed below. At this time, recommend HHOT follow-up. Will continue to follow acutely.     Follow Up Recommendations  Home health OT;Supervision - Intermittent    Equipment Recommendations  None recommended by OT    Recommendations for Other Services       Precautions / Restrictions Precautions Precautions: None Restrictions Weight Bearing Restrictions: No      Mobility Bed Mobility Overal bed mobility: Modified Independent             General bed mobility comments: pt sitting cross legged in bed, able to reposition and come to EOB as she desired  Transfers Overall transfer level: Needs assistance Equipment used: Rolling walker (2 wheeled) Transfers: Sit to/from Stand Sit to Stand: Min guard;+2 safety/equipment         General transfer comment: min-guard for safety as pt felt that L knee was mildly buckling    Balance Overall balance assessment: Needs assistance Sitting-balance  support: No upper extremity supported Sitting balance-Leahy Scale: Normal     Standing balance support: Single extremity supported Standing balance-Leahy Scale: Poor Standing balance comment: reliant on UE support                           ADL either performed or assessed with clinical judgement   ADL Overall ADL's : Needs assistance/impaired Eating/Feeding: Set up;Sitting   Grooming: Min guard;Standing   Upper Body Bathing: Set up;Sitting   Lower Body Bathing: Min guard;Sit to/from stand   Upper Body Dressing : Set up;Sitting   Lower Body Dressing: Min guard;Sit to/from stand Lower Body Dressing Details (indicate cue type and reason): able to figure-4 to access BLe Toilet Transfer: Min Marine scientist Details (indicate cue type and reason): simulated Toileting- Clothing Manipulation and Hygiene: Min guard;Sit to/from stand       Functional mobility during ADLs: Min guard;Rolling walker       Vision Baseline Vision/History: Wears glasses Wears Glasses: At all times Patient Visual Report: Blurring of vision Vision Assessment?: Yes Eye Alignment: Within Functional Limits Ocular Range of Motion: Within Functional Limits Alignment/Gaze Preference: Within Defined Limits Tracking/Visual Pursuits: Able to track stimulus in all quads without difficulty Saccades: Undershoots;Additional eye shifts occurred during testing;Decreased speed of saccadic movement Convergence: Within functional limits Visual Fields: Left visual field deficit;Impaired-to be further tested in functional context Additional Comments: appears to demonstrate Left visual field deficit, continue to further assess;demonstrates good compensatory strategies during functional mobiltiy with increased head turns     Perception     Praxis  Pertinent Vitals/Pain Pain Assessment: 0-10 Pain Score: 10-Worst pain ever Pain Location: headache Pain Descriptors / Indicators:  Headache Pain Intervention(s): Limited activity within patient's tolerance;Monitored during session;Patient requesting pain meds-RN notified;RN gave pain meds during session     Hand Dominance Right   Extremity/Trunk Assessment Upper Extremity Assessment Upper Extremity Assessment: RUE deficits/detail;LUE deficits/detail RUE Deficits / Details: grossly 3/5;able to maintain grasp on cup to take sip of water;able to perform thumb to finger opposition;decreased speed of rapid alternating movements RUE Sensation: WNL RUE Coordination: decreased fine motor LUE Deficits / Details: grossly 4/5  LUE Sensation: WNL LUE Coordination: WNL   Lower Extremity Assessment Lower Extremity Assessment: Defer to PT evaluation RLE Deficits / Details: hip flex 3+/5, knee ext 3+/5, knee flex 3+/5 RLE Sensation: WNL RLE Coordination: WNL LLE Deficits / Details: hip flex 2/5, knee ext 3-/5, knee flex 3-/5 LLE Sensation: WNL LLE Coordination: decreased gross motor   Cervical / Trunk Assessment Cervical / Trunk Assessment: Normal   Communication Communication Communication: Expressive difficulties   Cognition Arousal/Alertness: Awake/alert Behavior During Therapy: WFL for tasks assessed/performed Overall Cognitive Status: Impaired/Different from baseline Area of Impairment: Problem solving                             Problem Solving: Slow processing;Requires verbal cues General Comments: word finding difficulties, needed extra time to follow multistep cues   General Comments  L visual field cut, pt compensated well during gait with head turn    Exercises     Shoulder Instructions      Home Living Family/patient expects to be discharged to:: Private residence Living Arrangements: Spouse/significant other Available Help at Discharge: Family Type of Home: House Home Access: Stairs to enter Secretary/administrator of Steps: 2 Entrance Stairs-Rails: None Home Layout: One level      Bathroom Shower/Tub: Chief Strategy Officer: Standard Bathroom Accessibility: Yes How Accessible: Accessible via walker Home Equipment: None      Lives With: Spouse;Family    Prior Functioning/Environment Level of Independence: Independent        Comments: husband does grocery shopping;she was driving        OT Problem List: Decreased strength;Decreased range of motion;Decreased activity tolerance;Impaired balance (sitting and/or standing);Impaired vision/perception;Decreased cognition;Decreased safety awareness;Decreased coordination;Decreased knowledge of precautions;Impaired UE functional use      OT Treatment/Interventions: Self-care/ADL training;Therapeutic exercise;Energy conservation;DME and/or AE instruction;Therapeutic activities;Visual/perceptual remediation/compensation;Patient/family education;Balance training    OT Goals(Current goals can be found in the care plan section) Acute Rehab OT Goals Patient Stated Goal: return home OT Goal Formulation: With patient Time For Goal Achievement: 03/27/19 Potential to Achieve Goals: Good  OT Frequency: Min 2X/week   Barriers to D/C: Decreased caregiver support  pt lives with her husband       Co-evaluation PT/OT/SLP Co-Evaluation/Treatment: Yes Reason for Co-Treatment: Complexity of the patient's impairments (multi-system involvement);For patient/therapist safety;To address functional/ADL transfers PT goals addressed during session: Mobility/safety with mobility;Balance;Proper use of DME;Strengthening/ROM OT goals addressed during session: ADL's and self-care      AM-PAC OT "6 Clicks" Daily Activity     Outcome Measure Help from another person eating meals?: A Little Help from another person taking care of personal grooming?: A Little Help from another person toileting, which includes using toliet, bedpan, or urinal?: A Little Help from another person bathing (including washing, rinsing, drying)?: A  Little Help from another person to put on and taking off regular upper body clothing?: A  Little Help from another person to put on and taking off regular lower body clothing?: A Little 6 Click Score: 18   End of Session Equipment Utilized During Treatment: Gait belt;Rolling walker Nurse Communication: Mobility status  Activity Tolerance: Patient tolerated treatment well Patient left: in bed;with call bell/phone within reach;with nursing/sitter in room  OT Visit Diagnosis: Unsteadiness on feet (R26.81);Other abnormalities of gait and mobility (R26.89);Muscle weakness (generalized) (M62.81);Low vision, both eyes (H54.2);Other symptoms and signs involving cognitive function;Pain Pain - part of body: (headache)                Time: 6834-1962 OT Time Calculation (min): 25 min Charges:  OT General Charges $OT Visit: 1 Visit OT Evaluation $OT Eval Moderate Complexity: 1 Mod  Diona Browner OTR/L Acute Rehabilitation Services Office: [phone]   Rebeca Alert 03/13/2019, 2:21 PM

## 2019-03-14 ENCOUNTER — Encounter (HOSPITAL_COMMUNITY): Payer: Self-pay | Admitting: Family Medicine

## 2019-03-14 LAB — GLUCOSE, CAPILLARY
Glucose-Capillary: 98 mg/dL (ref 70–99)
Glucose-Capillary: 252 mg/dL — ABNORMAL HIGH (ref 70–99)
Glucose-Capillary: 67 mg/dL — ABNORMAL LOW (ref 70–99)
Glucose-Capillary: 84 mg/dL (ref 70–99)
Glucose-Capillary: 97 mg/dL (ref 70–99)

## 2019-03-14 MED ORDER — INSULIN REGULAR HUMAN (CONC) 500 UNIT/ML ~~LOC~~ SOPN
50.0000 [IU] | PEN_INJECTOR | Freq: Two times a day (BID) | SUBCUTANEOUS | Status: DC
  Administered 2019-03-15: 50 [IU] via SUBCUTANEOUS
  Filled 2019-03-14: qty 3

## 2019-03-14 MED ORDER — LIVING WELL WITH DIABETES BOOK
Freq: Once | Status: AC
  Filled 2019-03-14: qty 1

## 2019-03-14 MED ORDER — HYDROMORPHONE HCL 1 MG/ML IJ SOLN
0.5000 mg | Freq: Once | INTRAMUSCULAR | Status: AC
  Administered 2019-03-14: 0.5 mg via INTRAVENOUS
  Filled 2019-03-14: qty 0.5

## 2019-03-14 NOTE — Progress Notes (Signed)
Inpatient Diabetes Program Recommendations  AACE/ADA: New Consensus Statement on Inpatient Glycemic Control (2015)  Target Ranges:  Prepandial:   less than 140 mg/dL      Peak postprandial:   less than 180 mg/dL (1-2 hours)      Critically ill patients:  140 - 180 mg/dL   Lab Results  Component Value Date   GLUCAP 67 (L) 03/14/2019   HGBA1C 14.7 (H) 03/12/2019   Diabetes history: DM2 Outpatient Diabetes medications: U 500 insulin 180 units tid + Glucotrol 10 mg qd + Metformin 1 gm bid Current orders for Inpatient glycemic control: U 500 90 units tid + Novolog sensitive tid Inpatient Diabetes Program Recommendations:    Call received regarding patient's blood sugar dropping this afternoon to 67 mg/dL.  Consider holding PM dose of U500 and change U500 to bid with breakfast 0800 and supper 1700 for 03/15/19.  A1C indicates poor control of blood sugars at home.   Thanks,  Adah Perl, RN, BC-ADM Inpatient Diabetes Coordinator Pager [phone] (8a-5p)

## 2019-03-14 NOTE — Progress Notes (Signed)
Hypoglycemic Event  CBG: 67  Treatment: 8 oz of orange juice  Symptoms: none  Follow-up CBG: Time 1656  CBG Result: 84  Possible Reasons for Event:   Comments/MD notified: [place], DO and Diabetes Coordinator     [employer]

## 2019-03-14 NOTE — Progress Notes (Signed)
Patient's husband delivered battery and remote to spinal cord stimulator. Dr. Nehemiah Settle notified. Orders to charge stimulator. Patient also complains to 10/10 back pain with muscle spasms. Orders for one time dilaudid. Nurse will continue to monitor. [place]

## 2019-03-14 NOTE — Progress Notes (Signed)
[facility] PROGRESS NOTE  [name] JJH:[id number] DOB: [birth year] DOA: 03/11/2019 PCP: Elfredia Nevins, MD  Brief Summary: [name]  is a 53 y.o. female, with history of TIA, migraine, multiple sclerosis, hypertension, hyperlipidemia, asthma who came to ED with complaints of left-sided weakness, numbness and blurred vision and left upper extremity weakness.  Patient says the symptoms began around 7:30 PM.  She denies slurred speech, denies chest pain or shortness of breath.  Denies fever or chills. In the ED tele neurology evaluated the patient, she underwent CT head, CTA head and neck which was all unremarkable.  Recommended admission for stroke work-up.  Assessment/Plan: Active Problems:   HTN (hypertension)   Multiple sclerosis (HCC)   Uncontrolled type 2 diabetes mellitus with hyperglycemia (HCC)   Essential hypertension, benign   Class 2 severe obesity due to excess calories with serious comorbidity and body mass index (BMI) of 35.0 to 35.9 in adult [facility])   Facial droop   Facial Droop, RUE, LLE weakness  CT negative  PT and OT evaluated pt - persistent weakness.  Consult neurology - recommended MRI w&wo contrast to help r/o MS flare  Permissive hypertension  Echocardiogram done -normal (other than grade 1 diastolic dysfunction)  Lipid panel done (LDL 81)  Hemoglobin A1c done: 14.7  Patient has spinal cord stimulator, which hasn't been functional for a few years. Discussed with radiologist and MRI yesterday - need charger and remote. If doesn't have remote, then may need Neurosurgery to enter order to have rep evaluate the device. This needs to be done prior to the MRI. Patient didn't know where charger was, but patient's husband, who was in the room, states that he knows where it is. He will retreive it and see if charger and remote are available.  Poorly controlled diabetes with hyperglycemia  Patient had episode of hypoglycemia yesterday  We will decrease  U5 100 insulin to 90 units nightly  Continue sliding scale and CBGs  HTN  Permissive hypertension  Holding antihypertensives.  MS  Not currently on treatment  Class 2 Obesity  Asthma  Stable  GERD with h/o h Pylori gastritis.  Continue PPI    Code Status: Full code Family Communication:  Disposition Plan: Per PT/OT patient should be able to return home with rehab.   Consultants:  neuro  Procedures:  none  Antibiotics: none  HPI/Subjective: Patient still feels weak, although feels somewhat improved.  She thinks that this is different than her MS flare.  Objective: Vitals:   03/14/19 0429 03/14/19 0850  BP: 119/70 (!) 148/80  Pulse: 89 91  Resp: 18 16  Temp: 98 F (36.7 C) 97.9 F (36.6 C)  SpO2: 99% 97%    Intake/Output Summary (Last 24 hours) at 03/14/2019 1127 Last data filed at 03/14/2019 0830 Gross per 24 hour  Intake 2003.75 ml  Output --  Net 2003.75 ml   Filed Weights   03/11/19 2040  Weight: 86.2 kg    Exam:   General: A&O x3, no acute distress  Cardiovascular: Rate, normal S1-S2 sounds.  No murmurs.  Respiratory: Clear to auscultation bilaterally with no wheezes rales or rhonchi.  Abdomen: Soft, nontender, nondistended with appropriate bowel sounds  Musculoskeletal: Nontender joints. No contractures  Extremity: Warm, 2+ dorsalis pedis and radial pulses.  Neuro: LLE weakness, both flexors and extensors. RUE flexors weakness.  Data Reviewed: Basic Metabolic Panel: Recent Labs  Lab 03/11/19 2046  NA 134*  K 3.7  CL 99  CO2 26  GLUCOSE 437*  BUN 10  CREATININE 0.84  CALCIUM 8.4*   Liver Function Tests: Recent Labs  Lab 03/11/19 2046  AST 51*  ALT 61*  ALKPHOS 125  BILITOT 0.2*  PROT 7.3  ALBUMIN 3.4*   No results for input(s): LIPASE, AMYLASE in the last 168 hours. No results for input(s): AMMONIA in the last 168 hours. CBC: Recent Labs  Lab 03/11/19 2046  WBC 5.7  NEUTROABS 3.0  HGB 12.7  HCT  37.9  MCV 86.5  PLT 183   Cardiac Enzymes: No results for input(s): CKTOTAL, CKMB, CKMBINDEX, TROPONINI in the last 168 hours. BNP (last 3 results) No results for input(s): BNP in the last 8760 hours.  ProBNP (last 3 results) No results for input(s): PROBNP in the last 8760 hours.  CBG: Recent Labs  Lab 03/13/19 1209 03/13/19 1601 03/13/19 2109 03/13/19 2206 03/14/19 0625  GLUCAP 209* 190* 59* 82 98    Recent Results (from the past 240 hour(s))  SARS CORONAVIRUS 2 (TAT 6-24 HRS) Nasopharyngeal Nasopharyngeal Swab     Status: None   Collection Time: 03/11/19 10:32 PM   Specimen: Nasopharyngeal Swab  Result Value Ref Range Status   SARS Coronavirus 2 NEGATIVE NEGATIVE Final    Comment: (NOTE) SARS-CoV-2 target nucleic acids are NOT DETECTED. The SARS-CoV-2 RNA is generally detectable in upper and lower respiratory specimens during the acute phase of infection. Negative results do not preclude SARS-CoV-2 infection, do not rule out co-infections with other pathogens, and should not be used as the sole basis for treatment or other patient management decisions. Negative results must be combined with clinical observations, patient history, and epidemiological information. The expected result is Negative. Fact Sheet for Patients: SugarRoll.be Fact Sheet for Healthcare Providers: https://www.woods-mathews.com/ This test is not yet approved or cleared by the [place] FDA and  has been authorized for detection and/or diagnosis of SARS-CoV-2 by FDA under an Emergency Use Authorization (EUA). This EUA will remain  in effect (meaning this test can be used) for the duration of the COVID-19 declaration under Section 56 4(b)(1) of the Act, 21 U.S.C. section 360bbb-3(b)(1), unless the authorization is terminated or revoked sooner. Performed at [facility], [place] [address] [place], [place] [address]      Studies: ECHOCARDIOGRAM  COMPLETE  Result Date: 03/12/2019   ECHOCARDIOGRAM REPORT   Patient Name:   [name] [name] Date of Exam: 03/12/2019 Medical Rec #:  [id number]   Height:       65.0 in Accession #:    [id number]  Weight:       190.0 lb Date of Birth:  08-Aug-1965   BSA:          1.94 m Patient Age:    53 years    BP:           136/76 mmHg Patient Gender: F           HR:           80 bpm. Exam Location:  [facility] Procedure: 2D Echo Indications:    TIA 435.9 / G45.9  History:        Patient has prior history of Echocardiogram examinations, most                 recent 08/26/2016. Abnormal ECG, Signs/Symptoms:Chest Pain; Risk                 Factors:Hypertension, Dyslipidemia and Diabetes. Facial droop,  multiple sclerosis.  Sonographer:    [name] RDCS (AE) Referring Phys: Gomez Cleverly LAMA IMPRESSIONS  1. Left ventricular ejection fraction, by visual estimation, is 60 to 65%. The left ventricle has normal function. There is mildly increased left ventricular hypertrophy.  2. Left ventricular diastolic parameters are consistent with Grade I diastolic dysfunction (impaired relaxation).  3. The left ventricle has no regional wall motion abnormalities.  4. Global right ventricle has normal systolic function.The right ventricular size is normal. No increase in right ventricular wall thickness.  5. Left atrial size was normal.  6. Right atrial size was normal.  7. The mitral valve is normal in structure. No evidence of mitral valve regurgitation. No evidence of mitral stenosis.  8. The tricuspid valve is normal in structure. Tricuspid valve regurgitation is not demonstrated.  9. The aortic valve is tricuspid. Aortic valve regurgitation is not visualized. No evidence of aortic valve sclerosis or stenosis. 10. The pulmonic valve was not well visualized. Pulmonic valve regurgitation is not visualized. 11. The inferior vena cava is normal in size with greater than 50% respiratory variability, suggesting right atrial pressure  of 3 mmHg. FINDINGS  Left Ventricle: Left ventricular ejection fraction, by visual estimation, is 60 to 65%. The left ventricle has normal function. The left ventricle has no regional wall motion abnormalities. There is mildly increased left ventricular hypertrophy. Left ventricular diastolic parameters are consistent with Grade I diastolic dysfunction (impaired relaxation). Normal left atrial pressure. Right Ventricle: The right ventricular size is normal. No increase in right ventricular wall thickness. Global RV systolic function is has normal systolic function. Left Atrium: Left atrial size was normal in size. Right Atrium: Right atrial size was normal in size Pericardium: There is no evidence of pericardial effusion. Mitral Valve: The mitral valve is normal in structure. No evidence of mitral valve regurgitation. No evidence of mitral valve stenosis by observation. Tricuspid Valve: The tricuspid valve is normal in structure. Tricuspid valve regurgitation is not demonstrated. Aortic Valve: The aortic valve is tricuspid. Aortic valve regurgitation is not visualized. The aortic valve is structurally normal, with no evidence of sclerosis or stenosis. Aortic valve mean gradient measures 5.0 mmHg. Aortic valve peak gradient measures 7.8 mmHg. Aortic valve area, by VTI measures 2.63 cm. Pulmonic Valve: The pulmonic valve was not well visualized. Pulmonic valve regurgitation is not visualized. Pulmonic regurgitation is not visualized. No evidence of pulmonic stenosis. Aorta: The aortic root is normal in size and structure. Pulmonary Artery: Indeterminate PASP, inadequate TR jet. Venous: The inferior vena cava is normal in size with greater than 50% respiratory variability, suggesting right atrial pressure of 3 mmHg. IAS/Shunts: No atrial level shunt detected by color flow Doppler.  LEFT VENTRICLE PLAX 2D LVIDd:         4.14 cm  Diastology LVIDs:         2.60 cm  LV e' lateral:   7.29 cm/s LV PW:         1.25 cm  LV  E/e' lateral: 8.1 LV IVS:        1.31 cm  LV e' medial:    6.31 cm/s LVOT diam:     2.25 cm  LV E/e' medial:  9.3 LV SV:         51 ml LV SV Index:   25.44 LVOT Area:     3.98 cm  RIGHT VENTRICLE RV S prime:     16.20 cm/s TAPSE (M-mode): 2.3 cm LEFT ATRIUM  Index       RIGHT ATRIUM           Index LA diam:        3.00 cm 1.55 cm/m  RA Area:     13.70 cm LA Vol (A2C):   35.7 ml 18.45 ml/m RA Volume:   34.30 ml  17.72 ml/m LA Vol (A4C):   53.3 ml 27.54 ml/m LA Biplane Vol: 44.8 ml 23.15 ml/m  AORTIC VALVE AV Area (Vmax):    2.77 cm AV Area (Vmean):   2.36 cm AV Area (VTI):     2.63 cm AV Vmax:           140.08 cm/s AV Vmean:          108.645 cm/s AV VTI:            0.264 m AV Peak Grad:      7.8 mmHg AV Mean Grad:      5.0 mmHg LVOT Vmax:         97.50 cm/s LVOT Vmean:        64.402 cm/s LVOT VTI:          0.175 m LVOT/AV VTI ratio: 0.66  AORTA Ao Root diam: 2.80 cm MITRAL VALVE MV Area (PHT): 3.27 cm             SHUNTS MV PHT:        67.28 msec           Systemic VTI:  0.17 m MV Decel Time: 232 msec             Systemic Diam: 2.25 cm MV E velocity: 58.70 cm/s 103 cm/s MV A velocity: 67.40 cm/s 70.3 cm/s MV E/A ratio:  0.87       1.5  Dina RichJonathan Branch MD Electronically signed by Dina RichJonathan Branch MD Signature Date/Time: 03/12/2019/11:54:23 AM    Final     Scheduled Meds: .  stroke: mapping our early stages of recovery book   Does not apply Once  . aspirin EC  325 mg Oral QPM  . baclofen  20 mg Oral TID  . citalopram  40 mg Oral QPM  . enoxaparin (LOVENOX) injection  40 mg Subcutaneous Q24H  . famotidine  20 mg Oral BID  . gabapentin  800 mg Oral QID  . insulin aspart  0-9 Units Subcutaneous TID WC  . insulin regular human CONCENTRATED  90 Units Subcutaneous TID WC  . methocarbamol  500 mg Oral BID  . pravastatin  40 mg Oral QPM   Continuous Infusions: . sodium chloride 75 mL/hr at 03/13/19 1537      Time spent: 25    Levie HeritageJacob J Iysha Mishkin   03/14/2019, 11:27 AM  LOS: 2 days

## 2019-03-14 NOTE — Progress Notes (Signed)
PT Cancellation Note  Patient Details Name: MORGANN WOODBURN MRN: [id number] DOB: 1965-11-11   Cancelled Treatment:    Reason Eval/Treat Not Completed: Other (comment);Pain limiting ability to participate(trying to charge pain modulator). Will check back tomorrow.   Leighton Roach, [place]  Pager [phone] Office [place] 03/14/2019, 3:24 PM

## 2019-03-14 NOTE — Progress Notes (Signed)
Inpatient Diabetes Program Recommendations  AACE/ADA: New Consensus Statement on Inpatient Glycemic Control (2015)  Target Ranges:  Prepandial:   less than 140 mg/dL      Peak postprandial:   less than 180 mg/dL (1-2 hours)      Critically ill patients:  140 - 180 mg/dL   Lab Results  Component Value Date   GLUCAP 84 03/14/2019   HGBA1C 14.7 (H) 03/12/2019    Review of Glycemic Control  Inpatient Diabetes Program Recommendations:   Spoke with patient by phone regarding diabetes management. Discussed A1C results 14.7 (average blood glucose 375 over the past 2-3 months) and explained what an A1C is, basic pathophysiology of DM Type 2, basic home care, basic diabetes diet nutrition principles, importance of checking CBGs and maintaining good CBG control to prevent long-term and short-term complications. Reviewed signs and symptoms of hyperglycemia and hypoglycemia and how to treat hypoglycemia at home. Also reviewed blood sugar goals at home.  RNs to provide ongoing basic DM education at bedside with this patient. Have ordered educational booklet. Patient shared that Dr. Dorris Fetch spoke with her concerning amounts of apple juice she has been drinking and not following diabetes guidelines. Explained to pt. She is receiving much lower doses of insulin and her blood glucose is running much lower. Reviewed need to decrease A1c and decrease risks of elevated CBGs. Patient states understanding and stated "I need to just cook and take care of myself". Reviewed basic plate method with patient and patient plans to plan meals and snacks ahead of time.   Thank you, Nani Gasser. Vanden Fawaz, RN, MSN, CDE  Diabetes Coordinator Inpatient Glycemic Control Team Team Pager [phone] (8am-5pm) 03/14/2019 7:49 PM

## 2019-03-15 LAB — GLUCOSE, CAPILLARY
Glucose-Capillary: 168 mg/dL — ABNORMAL HIGH (ref 70–99)
Glucose-Capillary: 187 mg/dL — ABNORMAL HIGH (ref 70–99)

## 2019-03-15 MED ORDER — BLOOD GLUCOSE MONITOR KIT: PACK | 0 refills | Status: AC

## 2019-03-15 MED ORDER — SENNOSIDES-DOCUSATE SODIUM 8.6-50 MG PO TABS: 1.0000 | ORAL_TABLET | Freq: Every evening | ORAL | Status: DC | PRN

## 2019-03-15 MED ORDER — ASPIRIN EC 81 MG PO TBEC: 81.0000 mg | DELAYED_RELEASE_TABLET | Freq: Every evening | ORAL | 0 refills | Status: AC

## 2019-03-15 MED ORDER — FAMOTIDINE 20 MG PO TABS: 20.0000 mg | ORAL_TABLET | Freq: Two times a day (BID) | ORAL | Status: DC

## 2019-03-15 MED ORDER — HUMULIN R U-500 KWIKPEN 500 UNIT/ML ~~LOC~~ SOPN: 50.0000 [IU] | PEN_INJECTOR | Freq: Two times a day (BID) | SUBCUTANEOUS | Status: DC

## 2019-03-15 MED ORDER — CLOPIDOGREL BISULFATE 75 MG PO TABS: 75.0000 mg | ORAL_TABLET | Freq: Every day | ORAL | 1 refills | Status: AC

## 2019-03-15 MED ORDER — PRAVASTATIN SODIUM 40 MG PO TABS: 40.0000 mg | ORAL_TABLET | Freq: Every evening | ORAL | 1 refills | Status: AC

## 2019-03-15 NOTE — Progress Notes (Signed)
Patient being discharged home with home health.  Patient to be transported by her husband.  IV removed with the catheter intact.  Discharge instructions and prescription information given to the patient who verbalized understanding.

## 2019-03-15 NOTE — TOC Transition Note (Signed)
Transition of Care [facility]) - CM/SW Discharge Note   Patient Details  Name: [name] MRN: [id number] Date of Birth: 10/07/1965  Transition of Care [facility]) CM/SW Contact:  Pollie Friar, RN Phone Number: 03/15/2019, 12:22 PM   Clinical Narrative:    Pt discharging home with [facility] services through [facility]. Butch Penny with [facility] accepted the referral.  Pt has walker for home at the bedside.  Pt requesting prescription for glucose meter at home. CM updated Md and pt provided with prescription.  Spouse to provide transport home.    Final next level of care: Home w Home Health Services Barriers to Discharge: No Barriers Identified   Patient Goals and CMS Choice   CMS Medicare.gov Compare Post Acute Care list provided to:: Patient Choice offered to / list presented to : Patient  Discharge Placement                       Discharge Plan and Services   Discharge Planning Services: CM Consult Post Acute Care Choice: Home Health          DME Arranged: Walker rolling DME Agency: [employer] Date DME Agency Contacted: 03/15/19   Representative spoke with at DME Agency: [place]: PT, OT [place] Agency: [place] ([place]) Date [place]: 03/15/19   Representative spoke with at [place]: [place] ([place]) Interventions     Readmission Risk Interventions No flowsheet data found.

## 2019-03-15 NOTE — Progress Notes (Signed)
Physical Therapy Treatment Patient Details Name: [name] MRN: [id number] DOB: 1965/07/18 Today's Date: 03/15/2019    History of Present Illness Chrysa Patchen  is a 53 y.o. female, with history of TIA, migraine, multiple sclerosis, hypertension, hyperlipidemia, asthma who came to ED with complaints of left-sided weakness, numbness and blurred vision and left upper extremity weakness. Initial head CT neg, has been unable to have an MRI.     PT Comments    Focused session on safety with gait due to decreased vision on the L.  Also discussed safety with her animals when she arrives home.  Educated on stairs with husband's A. Recommend HHPT.  Her RW has already been delivered to her.   Follow Up Recommendations  Home health PT;Supervision for mobility/OOB     Equipment Recommendations  Rolling walker with 5" wheels(already delivered to the room)    Recommendations for Other Services       Precautions / Restrictions Restrictions Weight Bearing Restrictions: No    Mobility  Bed Mobility Overal bed mobility: Modified Independent                Transfers Overall transfer level: Modified independent   Transfers: Sit to/from Stand Sit to Stand: Modified independent (Device/Increase time)         General transfer comment: stood from toilet without A. Cues for hand placement from bed and demonstrated proper form after instruction.  Ambulation/Gait Ambulation/Gait assistance: Min guard Gait Distance (Feet): 250 Feet Assistive device: Rolling walker (2 wheeled) Gait Pattern/deviations: Step-through pattern;Decreased weight shift to left Gait velocity: decreased   General Gait Details: Facilitated increase attention to the L and need to scan environment due to decreased L vision.  Instructed in home safety to decrease fall risk. Pt verbalized understanding for needing the RW at this time.   Stairs Stairs: (Verbally reviewed entry stairs and how to manage with A)           Wheelchair Mobility    Modified Rankin (Stroke Patients Only) Modified Rankin (Stroke Patients Only) Pre-Morbid Rankin Score: No symptoms Modified Rankin: Moderate disability     Balance Overall balance assessment: Needs assistance         Standing balance support: During functional activity Standing balance-Leahy Scale: Fair Standing balance comment: Fair static, Fair- dynamic                            Cognition Arousal/Alertness: Awake/alert Behavior During Therapy: WFL for tasks assessed/performed Overall Cognitive Status: Impaired/Different from baseline Area of Impairment: Problem solving                             Problem Solving: Slow processing;Requires verbal cues        Exercises      General Comments        Pertinent Vitals/Pain Pain Assessment: No/denies pain    Home Living                      Prior Function            PT Goals (current goals can now be found in the care plan section) Acute Rehab PT Goals Potential to Achieve Goals: Good Progress towards PT goals: Progressing toward goals    Frequency    Min 4X/week      PT Plan Current plan remains appropriate    Co-evaluation  AM-PAC PT "6 Clicks" Mobility   Outcome Measure  Help needed turning from your back to your side while in a flat bed without using bedrails?: None Help needed moving from lying on your back to sitting on the side of a flat bed without using bedrails?: None Help needed moving to and from a bed to a chair (including a wheelchair)?: A Little Help needed standing up from a chair using your arms (e.g., wheelchair or bedside chair)?: A Little Help needed to walk in hospital room?: A Little Help needed climbing 3-5 steps with a railing? : A Little 6 Click Score: 20    End of Session Equipment Utilized During Treatment: Gait belt Activity Tolerance: Patient tolerated treatment well Patient left: in  bed;with call bell/phone within reach;with bed alarm set   PT Visit Diagnosis: Unsteadiness on feet (R26.81);Difficulty in walking, not elsewhere classified (R26.2)     Time: 7703-4035 PT Time Calculation (min) (ACUTE ONLY): 22 min  Charges:  $Gait Training: 8-22 mins                     Jamiel Goncalves L. Tamala Julian, [place] Pager [phone] 03/15/2019    Galen Manila 03/15/2019, 1:15 PM

## 2019-03-15 NOTE — Plan of Care (Signed)
Plan of care adequate for discharge.

## 2019-03-15 NOTE — Plan of Care (Signed)
Patient progressing towards plan of care goals. 

## 2019-03-15 NOTE — Discharge Summary (Addendum)
Physician Discharge Summary  [name] JYN:[id number] DOB: 1965-08-02 DOA: 03/11/2019  PCP: Elfredia Nevins, MD  Admit date: 03/11/2019 Discharge date: 03/15/2019  Time spent: 45 minutes  Recommendations for Outpatient Follow-up:  1. Follow up with neurologist in 1-2 weeks for evaluation of symptoms and possible MS exacerbation vs possible CVA 2. Home health PT with rolling walker 3. Follow up with PCP 1-2 weeks for evaluation of diabetes control-- follow LFTs as well 4. Asa plus plavix x 3 weeks then plavix alone  Discharge Diagnoses:  Principal Problem:   Facial droop from presume CVA Active Problems:   HTN (hypertension)   Multiple sclerosis (HCC)   Uncontrolled type 2 diabetes mellitus with hyperglycemia (HCC)   Class 2 severe obesity due to excess calories with serious comorbidity and body mass index (BMI) of 35.0 to 35.9 in adult [facility])  Discharge Condition: stable  Diet recommendation: heart healthy carb modified  Filed Weights   03/11/19 2040  Weight: 86.2 kg    History of present illness:   Patient with hx of tia, migraine, MS, htn, hyperlipidemia, dm, anxiety, asthma and non-compliance sented to [facility] 12/13 with complaints acute left face weakness, blurred vision and right upper extremity weakness. Code Stroke tele consult was called. Initial NIHSS 6 for partial hemanopia, facial asymmetry, bilateral upper extremity and left lower extremity drift, and decreased sensation. Head CT was negative. Last known well 1500 on 03/11/19 per teleneurology note. She was outside of the tPA window. CTA no large vessel occlusion.   She has a medtronic spinal cord stimulator that is inactive. She lives at home with her husband and is independent in activities of daily living. She is not currently on any medications for her Multiple Sclerosis, but has taken Rebiff and Copaxone in the past. She reported that she does have a neurologist in [facility], Dr. Gavin Potters. She said  that her neurologist is aware that she is no longer taking copaxone, and that they plan to reevaluate options at her next visit, which has been pushed due to COVID-19. She has been off Copaxone for nearly a year.   She was transferred to [facility] for stroke work up  Hospital Course:  Facial Droop, RUE, LLE weakness. CT negative (MS vs possible CVA- unable to get MRI) Evaluated by neurology who recommended MRI w&wo contrast to help r/o MS flare. Unable to get MRI due to [facility] stimulator.  -Neuro thinks possibly a CVA from small vessel disease and  Recommends DAPT for 3 weeks then plavix alone along with risk factor modification (better blood sugar control as well as LDL), increasing lipitor and close OP f/u with primary neuro for evaluation of MS flare. Eocardiogram normal (other than grade 1 diastolic dysfunction) Lipid panel done (LDL 81)Hemoglobin A1c done: 14.7.  -Home health PT   Poorly controlled diabetes with hyperglycemia HgA1c 14. Patient had episode of hypoglycemia 12/15. Home insulin decreased per diabetes coordinator recommendation. Recommend close OP follow up for optimal control.   HTN. Fair control. Home meds include lisinopril, toprol xl. Initially held. Resume home meds at discharge. .  MS Not currently on treatment  See #1.   Class 2 Obesity BMI 31.6.   Asthma  Stable  GERD with h/o h Pylori gastritis.  Continue PPI     Consultations:  [facility] neurology  Discharge Exam: Vitals:   03/15/19 0348 03/15/19 0754  BP: 130/80 139/87  Pulse: 74 88  Resp: 18   Temp: 97.7 F (36.5 C) 97.8 F (36.6 C)  SpO2: 96% 95%    General: awake alert no acute distress Cardiovascular: rrr no mgr no LE edema Respiratory: normal effort BS clear bilaterally no wheeze Neuro: speech clear, facial symmetry. Tongue midline  Discharge Instructions   Discharge Instructions    Call MD for:  persistant dizziness or light-headedness   Complete by: As directed    Call MD for:   temperature >100.4   Complete by: As directed    Diet - low sodium heart healthy   Complete by: As directed    Discharge instructions   Complete by: As directed    Follow up with primary neurology in 1-2 weeks for evaluation of symptoms and possibility of MS exacerbation Take medications as prescribed.  Home Health PT Asa plus plavix x 3 weeks then plavix alone   Increase activity slowly   Complete by: As directed      Allergies as of 03/15/2019      Reactions   Peanut-containing Drug Products Anaphylaxis, Hives   Patient states she is allergic to all nuts   Shellfish Allergy Anaphylaxis   Gluten Meal    Latex Other (See Comments)   Reaction: blistering   Prednisone Hives, Other (See Comments)   Reaction: causes psychological issues    Nexium [esomeprazole Magnesium] Hives   Tape Rash   Paper tape please      Medication List    TAKE these medications   albuterol 108 (90 Base) MCG/ACT inhaler Commonly known as: VENTOLIN HFA Inhale 1-2 puffs into the lungs every 6 (six) hours as needed for wheezing or shortness of breath.   aspirin EC 81 MG tablet Take 1 tablet (81 mg total) by mouth every evening for 21 days.   baclofen 20 MG tablet Commonly known as: LIORESAL Take 20 mg by mouth 3 (three) times daily.   busPIRone 10 MG tablet Commonly known as: BUSPAR Take 10 mg by mouth daily as needed. For anxiety   citalopram 40 MG tablet Commonly known as: CELEXA Take 40 mg by mouth every evening.   clopidogrel 75 MG tablet Commonly known as: Plavix Take 1 tablet (75 mg total) by mouth daily.   famotidine 20 MG tablet Commonly known as: PEPCID Take 1 tablet (20 mg total) by mouth 2 (two) times daily.   gabapentin 800 MG tablet Commonly known as: NEURONTIN Take 800 mg by mouth 4 (four) times daily.   glipiZIDE 10 MG 24 hr tablet Commonly known as: GLUCOTROL XL Take 1 tablet (10 mg total) by mouth daily with breakfast.   HumuLIN R U-500 KwikPen 500 UNIT/ML  kwikpen Generic drug: insulin regular human CONCENTRATED Inject 50 Units into the skin 2 (two) times daily with a meal. What changed:   how much to take  when to take this   lisinopril 2.5 MG tablet Commonly known as: ZESTRIL Take 2.5 mg by mouth every evening.   metFORMIN 500 MG tablet Commonly known as: GLUCOPHAGE Take 1,000 mg by mouth 2 (two) times daily.   methocarbamol 500 MG tablet Commonly known as: ROBAXIN Take 1 tablet (500 mg total) by mouth 2 (two) times daily.   metoprolol succinate 25 MG 24 hr tablet Commonly known as: TOPROL-XL Take 1 tablet by mouth 2 (two) times daily.   ondansetron 4 MG tablet Commonly known as: ZOFRAN Take 4 mg by mouth 4 (four) times daily as needed.   Oxycodone HCl 10 MG Tabs Take 10 mg by mouth every 4 (four) hours as needed (for pain).   pravastatin 40  MG tablet Commonly known as: PRAVACHOL Take 1 tablet (40 mg total) by mouth every evening. What changed:   medication strength  how much to take   senna-docusate 8.6-50 MG tablet Commonly known as: Senokot-S Take 1 tablet by mouth at bedtime as needed for moderate constipation.            Durable Medical Equipment  (From admission, onward)         Start     Ordered   03/15/19 0906  For home use only DME Walker rolling  Once    Question:  Patient needs a walker to treat with the following condition  Answer:  MS (multiple sclerosis) (HCC)   03/15/19 0905         Allergies  Allergen Reactions  . Peanut-Containing Drug Products Anaphylaxis and Hives    Patient states she is allergic to all nuts  . Shellfish Allergy Anaphylaxis  . Gluten Meal   . Latex Other (See Comments)    Reaction: blistering  . Prednisone Hives and Other (See Comments)    Reaction: causes psychological issues   . Nexium [Esomeprazole Magnesium] Hives  . Tape Rash    Paper tape please      The results of significant diagnostics from this hospitalization (including imaging,  microbiology, ancillary and laboratory) are listed below for reference.    Significant Diagnostic Studies: CT ANGIO HEAD W OR WO CONTRAST  Result Date: 03/11/2019 CLINICAL DATA:  Focal neuro deficit. EXAM: CT ANGIOGRAPHY HEAD AND NECK CT PERFUSION BRAIN TECHNIQUE: Multidetector CT imaging of the head and neck was performed using the standard protocol during bolus administration of intravenous contrast. Multiplanar CT image reconstructions and MIPs were obtained to evaluate the vascular anatomy. Carotid stenosis measurements (when applicable) are obtained utilizing NASCET criteria, using the distal internal carotid diameter as the denominator. Multiphase CT imaging of the brain was performed following IV bolus contrast injection. Subsequent parametric perfusion maps were calculated using RAPID software. CONTRAST:  OMNIPAQUE IOHEXOL 350 MG/ML SOLN COMPARISON:  CT head 03/11/2019 FINDINGS: CTA NECK FINDINGS Aortic arch: Standard branching. Imaged portion shows no evidence of aneurysm or dissection. No significant stenosis of the major arch vessel origins. Right carotid system: Normal right carotid. Negative for atherosclerotic disease or stenosis Left carotid system: Normal left carotid. Negative for atherosclerotic disease or stenosis. Vertebral arteries: Both vertebral arteries patent to the basilar without stenosis Skeleton: ACDF with pseudarthrosis at C4-5. ACDF with solid fusion C5-6 and C6-7. No acute skeletal abnormality. Other neck: 1 cm right thyroid nodule. Negative for mass or adenopathy. Upper chest: Lung apices clear bilaterally. Review of the MIP images confirms the above findings CTA HEAD FINDINGS Anterior circulation: Cavernous carotid widely patent bilaterally. Anterior and middle cerebral arteries widely patent bilaterally. Posterior circulation: Both vertebral arteries patent to the basilar. Basilar widely patent. PICA, AICA widely patent. Superior cerebellar and posterior cerebral  arteries patent bilaterally without stenosis. Venous sinuses: Normal venous enhancement Anatomic variants: None Review of the MIP images confirms the above findings CT Brain Perfusion Findings: ASPECTS: 10 CBF (<30%) Volume: 0mL Perfusion (Tmax>6.0s) volume: 0mL Mismatch Volume: 0mL Infarction Location:None IMPRESSION: 1. CT perfusion negative for acute infarct or ischemia 2. Negative CTA head and neck. Electronically Signed   By: Marlan Palau M.D.   On: 03/11/2019 21:52   DG Chest 2 View  Result Date: 03/12/2019 CLINICAL DATA:  Left-sided weakness EXAM: CHEST - 2 VIEW COMPARISON:  01/18/2019 FINDINGS: Cardiac shadow is stable. Spinal stimulator and cervical spine surgery  are seen. The lungs are clear. No sizable effusion or infiltrate is noted. No bony abnormality is seen. IMPRESSION: No active cardiopulmonary disease. Electronically Signed   By: Alcide CleverMark  Lukens M.D.   On: 03/12/2019 01:42   CT ANGIO NECK W OR WO CONTRAST  Result Date: 03/11/2019 CLINICAL DATA:  Focal neuro deficit. EXAM: CT ANGIOGRAPHY HEAD AND NECK CT PERFUSION BRAIN TECHNIQUE: Multidetector CT imaging of the head and neck was performed using the standard protocol during bolus administration of intravenous contrast. Multiplanar CT image reconstructions and MIPs were obtained to evaluate the vascular anatomy. Carotid stenosis measurements (when applicable) are obtained utilizing NASCET criteria, using the distal internal carotid diameter as the denominator. Multiphase CT imaging of the brain was performed following IV bolus contrast injection. Subsequent parametric perfusion maps were calculated using RAPID software. CONTRAST:  115mL OMNIPAQUE IOHEXOL 350 MG/ML SOLN COMPARISON:  CT head 03/11/2019 FINDINGS: CTA NECK FINDINGS Aortic arch: Standard branching. Imaged portion shows no evidence of aneurysm or dissection. No significant stenosis of the major arch vessel origins. Right carotid system: Normal right carotid. Negative for  atherosclerotic disease or stenosis Left carotid system: Normal left carotid. Negative for atherosclerotic disease or stenosis. Vertebral arteries: Both vertebral arteries patent to the basilar without stenosis Skeleton: ACDF with pseudarthrosis at C4-5. ACDF with solid fusion C5-6 and C6-7. No acute skeletal abnormality. Other neck: 1 cm right thyroid nodule. Negative for mass or adenopathy. Upper chest: Lung apices clear bilaterally. Review of the MIP images confirms the above findings CTA HEAD FINDINGS Anterior circulation: Cavernous carotid widely patent bilaterally. Anterior and middle cerebral arteries widely patent bilaterally. Posterior circulation: Both vertebral arteries patent to the basilar. Basilar widely patent. PICA, AICA widely patent. Superior cerebellar and posterior cerebral arteries patent bilaterally without stenosis. Venous sinuses: Normal venous enhancement Anatomic variants: None Review of the MIP images confirms the above findings CT Brain Perfusion Findings: ASPECTS: 10 CBF (<30%) Volume: 0mL Perfusion (Tmax>6.0s) volume: 0mL Mismatch Volume: 0mL Infarction Location:None IMPRESSION: 1. CT perfusion negative for acute infarct or ischemia 2. Negative CTA head and neck. Electronically Signed   By: Marlan Palauharles  Clark M.D.   On: 03/11/2019 21:52   CT CEREBRAL PERFUSION W CONTRAST  Result Date: 03/11/2019 CLINICAL DATA:  Focal neuro deficit. EXAM: CT ANGIOGRAPHY HEAD AND NECK CT PERFUSION BRAIN TECHNIQUE: Multidetector CT imaging of the head and neck was performed using the standard protocol during bolus administration of intravenous contrast. Multiplanar CT image reconstructions and MIPs were obtained to evaluate the vascular anatomy. Carotid stenosis measurements (when applicable) are obtained utilizing NASCET criteria, using the distal internal carotid diameter as the denominator. Multiphase CT imaging of the brain was performed following IV bolus contrast injection. Subsequent parametric  perfusion maps were calculated using RAPID software. CONTRAST:  115mL OMNIPAQUE IOHEXOL 350 MG/ML SOLN COMPARISON:  CT head 03/11/2019 FINDINGS: CTA NECK FINDINGS Aortic arch: Standard branching. Imaged portion shows no evidence of aneurysm or dissection. No significant stenosis of the major arch vessel origins. Right carotid system: Normal right carotid. Negative for atherosclerotic disease or stenosis Left carotid system: Normal left carotid. Negative for atherosclerotic disease or stenosis. Vertebral arteries: Both vertebral arteries patent to the basilar without stenosis Skeleton: ACDF with pseudarthrosis at C4-5. ACDF with solid fusion C5-6 and C6-7. No acute skeletal abnormality. Other neck: 1 cm right thyroid nodule. Negative for mass or adenopathy. Upper chest: Lung apices clear bilaterally. Review of the MIP images confirms the above findings CTA HEAD FINDINGS Anterior circulation: Cavernous carotid widely patent bilaterally. Anterior  and middle cerebral arteries widely patent bilaterally. Posterior circulation: Both vertebral arteries patent to the basilar. Basilar widely patent. PICA, AICA widely patent. Superior cerebellar and posterior cerebral arteries patent bilaterally without stenosis. Venous sinuses: Normal venous enhancement Anatomic variants: None Review of the MIP images confirms the above findings CT Brain Perfusion Findings: ASPECTS: 10 CBF (<30%) Volume: 0mL Perfusion (Tmax>6.0s) volume: 0mL Mismatch Volume: 0mL Infarction Location:None IMPRESSION: 1. CT perfusion negative for acute infarct or ischemia 2. Negative CTA head and neck. Electronically Signed   By: Marlan Palauharles  Clark M.D.   On: 03/11/2019 21:52   ECHOCARDIOGRAM COMPLETE  Result Date: 03/12/2019   ECHOCARDIOGRAM REPORT   Patient Name:   Cameron ProudMY C Wainright Date of Exam: 03/12/2019 Medical Rec #:  [id number]   Height:       65.0 in Accession #:    [id number]  Weight:       190.0 lb Date of Birth:  1965/04/30   BSA:          1.94 m Patient  Age:    53 years    BP:           136/76 mmHg Patient Gender: F           HR:           80 bpm. [facility] Procedure: 2D Echo Indications:    TIA 435.9 / G45.9  History:        Patient has prior history of Echocardiogram examinations, most                 recent 08/26/2016. Abnormal ECG, Signs/Symptoms:Chest Pain; Risk                 Factors:Hypertension, Dyslipidemia and Diabetes. Facial droop,                 multiple sclerosis.  Sonographer:    Jeryl ColumbiaJohanna Elliott RDCS (AE) Referring Phys: [id number] GAGAN S LAMA IMPRESSIONS  1. Left ventricular ejection fraction, by visual estimation, is 60 to 65%. The left ventricle has normal function. There is mildly increased left ventricular hypertrophy.  2. Left ventricular diastolic parameters are consistent with Grade I diastolic dysfunction (impaired relaxation).  3. The left ventricle has no regional wall motion abnormalities.  4. Global right ventricle has normal systolic function.The right ventricular size is normal. No increase in right ventricular wall thickness.  5. Left atrial size was normal.  6. Right atrial size was normal.  7. The mitral valve is normal in structure. No evidence of mitral valve regurgitation. No evidence of mitral stenosis.  8. The tricuspid valve is normal in structure. Tricuspid valve regurgitation is not demonstrated.  9. The aortic valve is tricuspid. Aortic valve regurgitation is not visualized. No evidence of aortic valve sclerosis or stenosis. 10. The pulmonic valve was not well visualized. Pulmonic valve regurgitation is not visualized. 11. The inferior vena cava is normal in size with greater than 50% respiratory variability, suggesting right atrial pressure of 3 mmHg. FINDINGS  Left Ventricle: Left ventricular ejection fraction, by visual estimation, is 60 to 65%. The left ventricle has normal function. The left ventricle has no regional wall motion abnormalities. There is mildly increased left ventricular hypertrophy. Left  ventricular diastolic parameters are consistent with Grade I diastolic dysfunction (impaired relaxation). Normal left atrial pressure. Right Ventricle: The right ventricular size is normal. No increase in right ventricular wall thickness. Global RV systolic function is has normal systolic function. Left Atrium: Left atrial size  was normal in size. Right Atrium: Right atrial size was normal in size Pericardium: There is no evidence of pericardial effusion. Mitral Valve: The mitral valve is normal in structure. No evidence of mitral valve regurgitation. No evidence of mitral valve stenosis by observation. Tricuspid Valve: The tricuspid valve is normal in structure. Tricuspid valve regurgitation is not demonstrated. Aortic Valve: The aortic valve is tricuspid. Aortic valve regurgitation is not visualized. The aortic valve is structurally normal, with no evidence of sclerosis or stenosis. Aortic valve mean gradient measures 5.0 mmHg. Aortic valve peak gradient measures 7.8 mmHg. Aortic valve area, by VTI measures 2.63 cm. Pulmonic Valve: The pulmonic valve was not well visualized. Pulmonic valve regurgitation is not visualized. Pulmonic regurgitation is not visualized. No evidence of pulmonic stenosis. Aorta: The aortic root is normal in size and structure. Pulmonary Artery: Indeterminate PASP, inadequate TR jet. Venous: The inferior vena cava is normal in size with greater than 50% respiratory variability, suggesting right atrial pressure of 3 mmHg. IAS/Shunts: No atrial level shunt detected by color flow Doppler.  LEFT VENTRICLE PLAX 2D LVIDd:         4.14 cm  Diastology LVIDs:         2.60 cm  LV e' lateral:   7.29 cm/s LV PW:         1.25 cm  LV E/e' lateral: 8.1 LV IVS:        1.31 cm  LV e' medial:    6.31 cm/s LVOT diam:     2.25 cm  LV E/e' medial:  9.3 LV SV:         51 ml LV SV Index:   25.44 LVOT Area:     3.98 cm  RIGHT VENTRICLE RV S prime:     16.20 cm/s TAPSE (M-mode): 2.3 cm LEFT ATRIUM              Index       RIGHT ATRIUM           Index LA diam:        3.00 cm 1.55 cm/m  RA Area:     13.70 cm LA Vol (A2C):   35.7 ml 18.45 ml/m RA Volume:   34.30 ml  17.72 ml/m LA Vol (A4C):   53.3 ml 27.54 ml/m LA Biplane Vol: 44.8 ml 23.15 ml/m  AORTIC VALVE AV Area (Vmax):    2.77 cm AV Area (Vmean):   2.36 cm AV Area (VTI):     2.63 cm AV Vmax:           140.08 cm/s AV Vmean:          108.645 cm/s AV VTI:            0.264 m AV Peak Grad:      7.8 mmHg AV Mean Grad:      5.0 mmHg LVOT Vmax:         97.50 cm/s LVOT Vmean:        64.402 cm/s LVOT VTI:          0.175 m LVOT/AV VTI ratio: 0.66  AORTA Ao Root diam: 2.80 cm MITRAL VALVE MV Area (PHT): 3.27 cm             SHUNTS MV PHT:        67.28 msec           Systemic VTI:  0.17 m MV Decel Time: 232 msec  Systemic Diam: 2.25 cm MV E velocity: 58.70 cm/s 103 cm/s MV A velocity: 67.40 cm/s 70.3 cm/s MV E/A ratio:  0.87       1.5  Dina Rich MD Electronically signed by Dina Rich MD Signature Date/Time: 03/12/2019/11:54:23 AM    Final    CT HEAD CODE STROKE WO CONTRAST  Result Date: 03/11/2019 CLINICAL DATA:  Code stroke. Ataxia. Left facial numbness. Blurred vision. Right-sided weakness. EXAM: CT HEAD WITHOUT CONTRAST TECHNIQUE: Contiguous axial images were obtained from the base of the skull through the vertex without intravenous contrast. COMPARISON:  CT head 01/18/2019 FINDINGS: Brain: No evidence of acute infarction, hemorrhage, hydrocephalus, extra-axial collection or mass lesion/mass effect. Vascular: Negative for hyperdense vessel Skull: Negative Sinuses/Orbits: Negative Other: None ASPECTS (Alberta Stroke Program Early CT Score) - Ganglionic level infarction (caudate, lentiform nuclei, internal capsule, insula, M1-M3 cortex): 7 - Supraganglionic infarction (M4-M6 cortex): 3 Total score (0-10 with 10 being normal): 10 IMPRESSION: 1. Negative CT head 2. ASPECTS is 10 3. These results were called by telephone at the time of  interpretation on 03/11/2019 at 9:00 pm to provider JOSHUA LONG , who verbally acknowledged these results. Electronically Signed   By: Marlan Palau M.D.   On: 03/11/2019 21:00    Microbiology: Recent Results (from the past 240 hour(s))  SARS CORONAVIRUS 2 (TAT 6-24 HRS) Nasopharyngeal Nasopharyngeal Swab     Status: None   Collection Time: 03/11/19 10:32 PM   Specimen: Nasopharyngeal Swab  Result Value Ref Range Status   SARS Coronavirus 2 NEGATIVE NEGATIVE Final    Comment: (NOTE) SARS-CoV-2 target nucleic acids are NOT DETECTED. The SARS-CoV-2 RNA is generally detectable in upper and lower respiratory specimens during the acute phase of infection. Negative results do not preclude SARS-CoV-2 infection, do not rule out co-infections with other pathogens, and should not be used as the sole basis for treatment or other patient management decisions. Negative results must be combined with clinical observations, patient history, and epidemiological information. The expected result is Negative. Fact Sheet for Patients: HairSlick.no Fact Sheet for Healthcare Providers: quierodirigir.com This test is not yet approved or cleared by the [place] FDA and  has been authorized for detection and/or diagnosis of SARS-CoV-2 by FDA under an Emergency Use Authorization (EUA). This EUA will remain  in effect (meaning this test can be used) for the duration of the COVID-19 declaration under Section 56 4(b)(1) of the Act, 21 U.S.C. section 360bbb-3(b)(1), unless the authorization is terminated or revoked sooner. Performed at [facility], [address] [place], [place] [address]      Labs: Basic Metabolic Panel: Recent Labs  Lab 03/11/19 2046  NA 134*  K 3.7  CL 99  CO2 26  GLUCOSE 437*  BUN 10  CREATININE 0.84  CALCIUM 8.4*   Liver Function Tests: Recent Labs  Lab 03/11/19 2046  AST 51*  ALT 61*  ALKPHOS 125   BILITOT 0.2*  PROT 7.3  ALBUMIN 3.4*   No results for input(s): LIPASE, AMYLASE in the last 168 hours. No results for input(s): AMMONIA in the last 168 hours. CBC: Recent Labs  Lab 03/11/19 2046  WBC 5.7  NEUTROABS 3.0  HGB 12.7  HCT 37.9  MCV 86.5  PLT 183   Cardiac Enzymes: No results for input(s): CKTOTAL, CKMB, CKMBINDEX, TROPONINI in the last 168 hours. BNP: BNP (last 3 results) No results for input(s): BNP in the last 8760 hours.  ProBNP (last 3 results) No results for input(s): PROBNP in  the last 8760 hours.  CBG: Recent Labs  Lab 03/14/19 1204 03/14/19 1608 03/14/19 1656 03/14/19 2114 03/15/19 0634  GLUCAP 252* 67* 84 97 168*       Signed:  Joseph Art DO [facility] 03/15/2019, 11:51 AM

## 2019-03-27 ENCOUNTER — Other Ambulatory Visit: Payer: Self-pay

## 2019-03-27 ENCOUNTER — Encounter: Payer: Self-pay | Admitting: "Endocrinology

## 2019-03-27 ENCOUNTER — Ambulatory Visit: Payer: BC Managed Care – PPO | Admitting: "Endocrinology

## 2019-03-27 VITALS — BP 129/80 | HR 118 | Ht 65.0 in | Wt 197.4 lb

## 2019-03-27 DIAGNOSIS — I10 Essential (primary) hypertension: Secondary | ICD-10-CM

## 2019-03-27 DIAGNOSIS — E782 Mixed hyperlipidemia: Secondary | ICD-10-CM | POA: Diagnosis not present

## 2019-03-27 DIAGNOSIS — E1159 Type 2 diabetes mellitus with other circulatory complications: Secondary | ICD-10-CM | POA: Diagnosis not present

## 2019-03-27 MED ORDER — HUMULIN R U-500 KWIKPEN 500 UNIT/ML ~~LOC~~ SOPN: 180.0000 [IU] | PEN_INJECTOR | Freq: Three times a day (TID) | SUBCUTANEOUS | Status: DC

## 2019-03-27 MED ORDER — DEXCOM G6 TRANSMITTER MISC: 1.0000 | 1 refills | Status: DC

## 2019-03-27 MED ORDER — DEXCOM G6 SENSOR MISC: 4.0000 | 2 refills | Status: DC

## 2019-03-27 NOTE — Progress Notes (Signed)
03/27/2019, 8:21 PM   Endocrinology follow-up note  Subjective:    Patient ID: [name], female    DOB: Sep 13, 1965.  [name] is being seen in follow-up  for the management of currently uncontrolled symptomatic 2 diabetes, hyperlipidemia, hypertension. PMD:   Redmond School, MD.   Past Medical History:  Diagnosis Date  . Asthma   . Chronic abdominal pain   . Chronic neck pain    C4-6 fusion  . CONSTIPATION 05/16/2009   Qualifier: Diagnosis of  By: Ronnald Ramp FNP-BC, Kandice L   . Diabetes mellitus without complication ([place])   . Dysphagia 02/23/2012  . Fatty liver   . GERD (gastroesophageal reflux disease)   . Helicobacter pylori gastritis 2011  . Hyperlipidemia   . Hypertension   . LIVER FUNCTION TESTS, ABNORMAL, HX OF 05/14/2009   Qualifier: Diagnosis of  By: Stephan Minister    . Migraine headache   . MIGRAINE, COMMON 05/14/2009   Qualifier: Diagnosis of  By: Stephan Minister    . MS (multiple sclerosis) ([place])   . Sleep apnea    Past Surgical History:  Procedure Laterality Date  . ABDOMINAL HYSTERECTOMY    . APPENDECTOMY    . BACK SURGERY  03/07/2012   Nerve Stimulator  . CHOLECYSTECTOMY    . COLONOSCOPY  07/09/2009   RMR; normal rectum aside from anal canal hemorrhoids/scattered left-sided diverticula  . ESOPHAGOGASTRODUODENOSCOPY  05/22/2009   RMR; normal /small HH  . ESOPHAGOGASTRODUODENOSCOPY  2007   RMR: non-critical Schatzki's rin, non-maniuplated  . ESOPHAGOGASTRODUODENOSCOPY N/A 10/24/2013   Procedure: ESOPHAGOGASTRODUODENOSCOPY (EGD);  Surgeon: Daneil Dolin, MD;  Location: [facility];  Service: Endoscopy;  Laterality: N/A;  9:45  . ESOPHAGOGASTRODUODENOSCOPY (EGD) WITH ESOPHAGEAL DILATION  03/17/2012   STM:[id number] appearing esophageal mucosa of uncertain significance. Status post Venia Minks dilation followed by esophageal bx  .  MALONEY DILATION N/A 10/24/2013   Procedure: Venia Minks DILATION;  Surgeon: Daneil Dolin, MD;  Location: [facility];  Service: Endoscopy;  Laterality: N/A;  . MANDIBLE SURGERY    . NECK SURGERY     X2, last time 2013   Social History   Socioeconomic History  . Marital status: Married    Spouse name: Not on file  . Number of children: Not on file  . Years of education: Not on file  . Highest education level: Not on file  Occupational History  . Not on file  Tobacco Use  . Smoking status: Never Smoker  . Smokeless tobacco: Never Used  Substance and Sexual Activity  . Alcohol use: No  . Drug use: No  . Sexual activity: Yes    Birth control/protection: Surgical  Other Topics Concern  . Not on file  Social History Narrative   5 kids, raising grandchild   Social Determinants of Health   Financial Resource Strain:   . Difficulty of Paying Living Expenses: Not  on file  Food Insecurity:   . Worried About Charity fundraiser in the Last Year: Not on file  . Ran Out of Food in the Last Year: Not on file  Transportation Needs:   . Lack of Transportation (Medical): Not on file  . Lack of Transportation (Non-Medical): Not on file  Physical Activity:   . Days of Exercise per Week: Not on file  . Minutes of Exercise per Session: Not on file  Stress:   . Feeling of Stress : Not on file  Social Connections:   . Frequency of Communication with Friends and Family: Not on file  . Frequency of Social Gatherings with Friends and Family: Not on file  . Attends Religious Services: Not on file  . Active Member of Clubs or Organizations: Not on file  . Attends Archivist Meetings: Not on file  . Marital Status: Not on file   Outpatient Encounter Medications as of 03/27/2019  Medication Sig  . albuterol (VENTOLIN HFA) 108 (90 Base) MCG/ACT inhaler Inhale 1-2 puffs into the lungs every 6 (six) hours as needed for wheezing or shortness of breath.   Marland Kitchen aspirin EC 81 MG tablet Take 1  tablet (81 mg total) by mouth every evening for 21 days.  . baclofen (LIORESAL) 20 MG tablet Take 20 mg by mouth 3 (three) times daily.   . blood glucose meter kit and supplies KIT Dispense based on patient and insurance preference. Use up to four times daily as directed. (FOR ICD-9 250.00, 250.01).  . busPIRone (BUSPAR) 10 MG tablet Take 10 mg by mouth daily as needed. For anxiety  . citalopram (CELEXA) 40 MG tablet Take 40 mg by mouth every evening.   . clopidogrel (PLAVIX) 75 MG tablet Take 1 tablet (75 mg total) by mouth daily.  . Continuous Blood Gluc Sensor (DEXCOM G6 SENSOR) MISC 4 Pieces by Does not apply route once a week.  . Continuous Blood Gluc Transmit (DEXCOM G6 TRANSMITTER) MISC 1 Piece by Does not apply route as directed.  . gabapentin (NEURONTIN) 800 MG tablet Take 800 mg by mouth 4 (four) times daily.  Marland Kitchen glipiZIDE (GLUCOTROL XL) 10 MG 24 hr tablet Take 1 tablet (10 mg total) by mouth daily with breakfast.  . insulin regular human CONCENTRATED (HUMULIN R U-500 KWIKPEN) 500 UNIT/ML kwikpen Inject 180 Units into the skin 3 (three) times daily with meals.  Marland Kitchen lisinopril (PRINIVIL,ZESTRIL) 2.5 MG tablet Take 2.5 mg by mouth every evening.   . metFORMIN (GLUCOPHAGE) 500 MG tablet Take 1,000 mg by mouth 2 (two) times daily.  . metoprolol succinate (TOPROL-XL) 25 MG 24 hr tablet Take 1 tablet by mouth 2 (two) times daily.  . ondansetron (ZOFRAN) 4 MG tablet Take 4 mg by mouth 4 (four) times daily as needed.  . Oxycodone HCl 10 MG TABS Take 10 mg by mouth every 4 (four) hours as needed (for pain).   . pravastatin (PRAVACHOL) 40 MG tablet Take 1 tablet (40 mg total) by mouth every evening.  . senna-docusate (SENOKOT-S) 8.6-50 MG tablet Take 1 tablet by mouth at bedtime as needed for moderate constipation.  . [DISCONTINUED] famotidine (PEPCID) 20 MG tablet Take 1 tablet (20 mg total) by mouth 2 (two) times daily.  . [DISCONTINUED] insulin regular human CONCENTRATED (HUMULIN R U-500 KWIKPEN)  500 UNIT/ML kwikpen Inject 50 Units into the skin 2 (two) times daily with a meal.  . [DISCONTINUED] methocarbamol (ROBAXIN) 500 MG tablet Take 1 tablet (500 mg total) by  mouth 2 (two) times daily.   No facility-administered encounter medications on file as of 03/27/2019.    ALLERGIES: Allergies  Allergen Reactions  . Peanut-Containing Drug Products Anaphylaxis and Hives    Patient states she is allergic to all nuts  . Shellfish Allergy Anaphylaxis  . Gluten Meal   . Latex Other (See Comments)    Reaction: blistering  . Prednisone Hives and Other (See Comments)    Reaction: causes psychological issues   . Nexium [Esomeprazole Magnesium] Hives  . Tape Rash    Paper tape please    VACCINATION STATUS: Immunization History  Administered Date(s) Administered  . Influenza Split 04/17/2012  . Pneumococcal Polysaccharide-23 04/17/2012    Diabetes She presents for her follow-up diabetic visit. She has type 2 diabetes mellitus. Onset time: She was diagnosed at approximate age of 10 years. Her disease course has been worsening. There are no hypoglycemic associated symptoms. Pertinent negatives for hypoglycemia include no confusion, pallor or seizures. Associated symptoms include fatigue, polydipsia and polyuria. Pertinent negatives for diabetes include no polyphagia. There are no hypoglycemic complications. Symptoms are worsening. There are no diabetic complications. Risk factors for coronary artery disease include diabetes mellitus, hypertension, obesity, sedentary lifestyle and dyslipidemia. Current diabetic treatment includes insulin injections. She is compliant with treatment most of the time. She is following a generally healthy (she admits to consumption of large quantities of soda.) diet. When asked about meal planning, she reported none. She has not had a previous visit with a dietitian. She never participates in exercise. Her home blood glucose trend is decreasing steadily. (Despite  high-dose insulin, she still has significantly above target average blood glucose  Profile.  Presenting with fasting blood glucose ranging between 90-330, prelunch between 200-400, presupper between 90-370, bedtime 286-400) An ACE inhibitor/angiotensin II receptor blocker is being taken.  Hyperlipidemia This is a chronic problem. The current episode started more than 1 year ago. The problem is uncontrolled. Exacerbating diseases include diabetes and obesity. Pertinent negatives include no myalgias. Current antihyperlipidemic treatment includes statins. Risk factors for coronary artery disease include dyslipidemia, diabetes mellitus, hypertension, obesity and a sedentary lifestyle.    Review of systems: Constitutional: no weight gain/loss, no fatigue, no subjective hyperthermia, no subjective hypothermia Eyes: no blurry vision, no xerophthalmia ENT: no sore throat, no nodules palpated in throat, no dysphagia/odynophagia, no hoarseness Cardiovascular: no Chest Pain, no Shortness of Breath, no palpitations, no leg swelling Respiratory: no cough, no SOB Gastrointestinal: no Nausea/Vomiting/Diarhhea Musculoskeletal: no muscle/joint aches Skin: no rashes Neurological: no tremors, no numbness, no tingling, no dizziness Psychiatric: no depression, no anxiety  Objective:    BP 129/80   Pulse (!) 118   Ht '5\' 5"'  (1.651 m)   Wt 197 lb 6.4 oz (89.5 kg)   BMI 32.85 kg/m   Wt Readings from Last 3 Encounters:  03/27/19 197 lb 6.4 oz (89.5 kg)  03/11/19 190 lb (86.2 kg)  11/30/18 194 lb (88 kg)     Physical Exam- Limited  Constitutional:  Body mass index is 32.85 kg/m. , not in acute distress, normal state of mind Eyes:  EOMI, no exophthalmos Neck: Supple Respiratory: Adequate breathing efforts CVS: Tachycardic. Musculoskeletal: no gross deformities, strength intact in all four extremities, no gross restriction of joint movements Skin:  no rashes, no hyperemia, + extensive  tattoos. Neurological: no tremor with outstretched hands.     Diabetic Labs (most recent): Lab Results  Component Value Date   HGBA1C 14.7 (H) 03/12/2019   HGBA1C >14.0 (H) 11/01/2018  HGBA1C 13.9 (H) 07/05/2018     Lipid Panel ( most recent) Lipid Panel     Component Value Date/Time   CHOL 146 03/12/2019 0432   TRIG 105 03/12/2019 0432   HDL 44 03/12/2019 0432   CHOLHDL 3.3 03/12/2019 0432   VLDL 21 03/12/2019 0432   LDLCALC 81 03/12/2019 0432   LDLCALC 116 (H) 04/28/2017 1101      Lab Results  Component Value Date   TSH 0.31 (L) 04/28/2017   TSH 0.667 04/16/2012   FREET4 1.1 04/28/2017       Assessment & Plan:   1. Uncontrolled type 2 diabetes mellitus with hyperglycemia ([place])  - Cattie C Zachow has currently uncontrolled symptomatic type 2 DM since  53 years of age. -This patient continues to have persistent hyperglycemia despite high-dose of  large dose of insulin utilizing insulin U500. While her recent hospitalization for possible CVA, she responded to a significantly lower dose of insulin U500, 50 units twice daily.   She was discharged on March 16, 2019 and she has resumed her previous dose of insulin U500 180 units 3 times daily AC, continue to have average blood glucose of 274 over the last 11 days.     -Her previsit labs show A1c of  > 14%. -her diabetes is complicated by obesity/ sedentary life and Eiliana C Katayama remains at extremely high risk for more acute and chronic complications which include CAD, CVA, CKD, retinopathy, and neuropathy. These are all discussed in detail with the patient.  - I have counseled her on diet management and weight loss, by adopting a carbohydrate restricted/protein rich diet.  She admits to continued consumption of large quantities of soda, which explains the extremely high insulin resistance in her case.  - she  admits there is a room for improvement in her diet and drink choices. -  Suggestion is made for her to avoid  simple carbohydrates  from her diet including Cakes, Sweet Desserts / Pastries, Ice Cream, Soda (diet and regular), Sweet Tea, Candies, Chips, Cookies, Sweet Pastries,  Store Bought Juices, Alcohol in Excess of  1-2 drinks a day, Artificial Sweeteners, Coffee Creamer, and "Sugar-free" Products. This will help patient to have stable blood glucose profile and potentially avoid unintended weight gain.  - I encouraged her to switch to  unprocessed or minimally processed complex starch and increased protein intake (animal or plant source), fruits, and vegetables.  - she is advised to stick to a routine mealtimes to eat 3 meals  a day and avoid unnecessary snacks ( to snack only to correct hypoglycemia).   - I have approached her with the following individualized plan to manage diabetes and patient agrees:    -During her recent hospitalization, she has responded to a much lower dose of insulin U500--50 units twice daily.   -Her extremities during stance is driven by her consumption of concentrated and sweetened beverages at home, based on her consistently above target glycemic profile, she will continue to require higher dose of insulin.  However, it would be unsafe to further escalate her insulin.  She is at extremely low a high risk for hypoglycemia from inadvertent use of insulin, especially if she decides to decrease her carbs consumption.  -She is advised to continue  her Humulin U500 180 units 3 times a day  20 minutes before her 3  major meals, associated with strict monitoring of blood glucose 4 times a day minimum.  I advised her to rotate her injection site.  She wears a Dexcom CGM device, advised to wear it at all times.     -Once again, she is encouraged to call clinic for blood glucose levels less than 70 or above 300 mg /dl. -She is advised to continue metformin 1000 mg p.o. twice daily , therapeutically suitable for patient . -She is also advised to continue Trulicity  1.5 mg subcutaneously  weekly.    Side effects and precautions discussed with her. -She is advised to continue glipizide  10 mg XL p.o. daily at breakfast.     2) Lipids/HPL: Her last lipid panel showed improving LDL of 116, improving from 141.  She is advised to continue atorvastatin 20 mg p.o. nightly.           Side effects and precautions discussed with her.    3) hypertension: Her blood pressure is controlled to target. She is advised to continue lisinopril 2.5 mg p.o. daily.     - I advised patient to maintain close follow up with Redmond School, MD for primary care needs.  - Patient Care Time Today:  25 min, of which >50% was spent in  counseling and the rest reviewing her  current and  previous labs/studies, previous treatments, her blood glucose readings, and medications' doses and developing a plan for long-term care based on the latest recommendations for standards of care.   Shantrice Kirt Boys participated in the discussions, expressed understanding, and voiced agreement with the above plans.  All questions were answered to her satisfaction. she is encouraged to contact clinic should she have any questions or concerns prior to her return visit.   Follow up plan: - Return in about 10 days (around 04/06/2019), or phone, for Follow up with [facility] - no Labs.  Glade Lloyd, MD [facility] [address] [place], [place] [address] Phone: [phone]  Fax: [phone]    03/27/2019, 8:21 PM  This note was partially dictated with voice recognition software. Similar sounding words can be transcribed inadequately or may not  be corrected upon review.

## 2019-03-27 NOTE — Patient Instructions (Signed)

## 2019-03-28 ENCOUNTER — Other Ambulatory Visit: Payer: Self-pay | Admitting: Internal Medicine

## 2019-03-28 ENCOUNTER — Other Ambulatory Visit (HOSPITAL_COMMUNITY): Payer: Self-pay | Admitting: Internal Medicine

## 2019-03-28 DIAGNOSIS — I639 Cerebral infarction, unspecified: Secondary | ICD-10-CM

## 2019-04-06 ENCOUNTER — Encounter (HOSPITAL_COMMUNITY): Payer: Self-pay

## 2019-04-06 ENCOUNTER — Ambulatory Visit (HOSPITAL_COMMUNITY): Payer: BC Managed Care – PPO

## 2019-04-11 ENCOUNTER — Ambulatory Visit (INDEPENDENT_AMBULATORY_CARE_PROVIDER_SITE_OTHER): Payer: BC Managed Care – PPO | Admitting: "Endocrinology

## 2019-04-11 ENCOUNTER — Encounter: Payer: Self-pay | Admitting: "Endocrinology

## 2019-04-11 DIAGNOSIS — E782 Mixed hyperlipidemia: Secondary | ICD-10-CM

## 2019-04-11 DIAGNOSIS — E1159 Type 2 diabetes mellitus with other circulatory complications: Secondary | ICD-10-CM | POA: Diagnosis not present

## 2019-04-11 DIAGNOSIS — I10 Essential (primary) hypertension: Secondary | ICD-10-CM | POA: Diagnosis not present

## 2019-04-11 DIAGNOSIS — Z6835 Body mass index (BMI) 35.0-35.9, adult: Secondary | ICD-10-CM

## 2019-04-11 MED ORDER — DEXCOM G6 TRANSMITTER MISC: 1.0000 | 1 refills | Status: DC

## 2019-04-11 NOTE — Progress Notes (Signed)
04/11/2019, 12:30 PM                                                      Endocrinology Telehealth Visit Follow up Note -During COVID -19 Pandemic  This visit type was conducted due to national recommendations for restrictions regarding the COVID-19 Pandemic  in an effort to limit this patient's exposure and mitigate transmission of the corona virus.  Due to her co-morbid illnesses, [name] is at  moderate to high risk for complications without adequate follow up.  This format is felt to be most appropriate for her at this time.  I connected with this patient on 04/11/2019   by telephone and verified that I am speaking with the correct person using two identifiers. MALYAH OHLRICH, 02/04/1966. she has verbally consented to this visit. All issues noted in this document were discussed and addressed. The format was not optimal for physical exam.    Subjective:    Patient ID: [name], female    DOB: 1965-08-25.  [name] is being engaged in a telehealth via telephone in follow-up  for the management of currently uncontrolled symptomatic 2 diabetes, hyperlipidemia, hypertension. PMD:   [employer], MD.   Past Medical History:  Diagnosis Date  . Asthma   . Chronic abdominal pain   . Chronic neck pain    C4-6 fusion  . CONSTIPATION 05/16/2009   Qualifier: Diagnosis of  By: Ronnald Ramp FNP-BC, Kandice L   . Diabetes mellitus without complication ([place])   . Dysphagia 02/23/2012  . Fatty liver   . GERD (gastroesophageal reflux disease)   . Helicobacter pylori gastritis 2011  . Hyperlipidemia   . Hypertension   . LIVER FUNCTION TESTS, ABNORMAL, HX OF 05/14/2009   Qualifier: Diagnosis of  By: Stephan Minister    . Migraine headache   . MIGRAINE, COMMON 05/14/2009   Qualifier: Diagnosis of  By: Stephan Minister    . MS (multiple sclerosis) ([place])   . Sleep apnea    Past  Surgical History:  Procedure Laterality Date  . ABDOMINAL HYSTERECTOMY    . APPENDECTOMY    . BACK SURGERY  03/07/2012   Nerve Stimulator  . CHOLECYSTECTOMY    . COLONOSCOPY  07/09/2009   RMR; normal rectum aside from anal canal hemorrhoids/scattered left-sided diverticula  . ESOPHAGOGASTRODUODENOSCOPY  05/22/2009   RMR; normal /small HH  . ESOPHAGOGASTRODUODENOSCOPY  2007   RMR: non-critical Schatzki's rin, non-maniuplated  . ESOPHAGOGASTRODUODENOSCOPY N/A 10/24/2013   Procedure: ESOPHAGOGASTRODUODENOSCOPY (EGD);  Surgeon: Daneil Dolin, MD;  [facility];  Service: Endoscopy;  Laterality: N/A;  9:45  . ESOPHAGOGASTRODUODENOSCOPY (EGD) WITH ESOPHAGEAL DILATION  03/17/2012   TMH:DQQIWLNL appearing esophageal mucosa of uncertain significance.  Status post Venia Minks dilation followed by esophageal bx  . MALONEY DILATION N/A 10/24/2013   Procedure: Venia Minks DILATION;  Surgeon: Daneil Dolin, MD;  Location: [facility];  Service: Endoscopy;  Laterality: N/A;  . MANDIBLE SURGERY    . NECK SURGERY     X2, last time 2013   Social History   Socioeconomic History  . Marital status: Married    Spouse name: Not on file  . Number of children: Not on file  . Years of education: Not on file  . Highest education level: Not on file  Occupational History  . Not on file  Tobacco Use  . Smoking status: Never Smoker  . Smokeless tobacco: Never Used  Substance and Sexual Activity  . Alcohol use: No  . Drug use: No  . Sexual activity: Yes    Birth control/protection: Surgical  Other Topics Concern  . Not on file  Social History Narrative   5 kids, raising grandchild   Social Determinants of Health   Financial Resource Strain:   . Difficulty of Paying Living Expenses: Not on file  Food Insecurity:   . Worried About Charity fundraiser in the Last Year: Not on file  . Ran Out of Food in the Last Year: Not on file  Transportation Needs:   . Lack of Transportation (Medical): Not  on file  . Lack of Transportation (Non-Medical): Not on file  Physical Activity:   . Days of Exercise per Week: Not on file  . Minutes of Exercise per Session: Not on file  Stress:   . Feeling of Stress : Not on file  Social Connections:   . Frequency of Communication with Friends and Family: Not on file  . Frequency of Social Gatherings with Friends and Family: Not on file  . Attends Religious Services: Not on file  . Active Member of Clubs or Organizations: Not on file  . Attends Archivist Meetings: Not on file  . Marital Status: Not on file   Outpatient Encounter Medications as of 04/11/2019  Medication Sig  . albuterol (VENTOLIN HFA) 108 (90 Base) MCG/ACT inhaler Inhale 1-2 puffs into the lungs every 6 (six) hours as needed for wheezing or shortness of breath.   . baclofen (LIORESAL) 20 MG tablet Take 20 mg by mouth 3 (three) times daily.   . blood glucose meter kit and supplies KIT Dispense based on patient and insurance preference. Use up to four times daily as directed. (FOR ICD-9 250.00, 250.01).  . busPIRone (BUSPAR) 10 MG tablet Take 10 mg by mouth daily as needed. For anxiety  . citalopram (CELEXA) 40 MG tablet Take 40 mg by mouth every evening.   . clopidogrel (PLAVIX) 75 MG tablet Take 1 tablet (75 mg total) by mouth daily.  . Continuous Blood Gluc Sensor (DEXCOM G6 SENSOR) MISC 4 Pieces by Does not apply route once a week.  . Continuous Blood Gluc Transmit (DEXCOM G6 TRANSMITTER) MISC 1 Piece by Does not apply route as directed.  . gabapentin (NEURONTIN) 800 MG tablet Take 800 mg by mouth 4 (four) times daily.  Marland Kitchen glipiZIDE (GLUCOTROL XL) 10 MG 24 hr tablet Take 1 tablet (10 mg total) by mouth daily with breakfast.  . insulin regular human CONCENTRATED (HUMULIN R U-500 KWIKPEN) 500 UNIT/ML kwikpen Inject 180 Units into the skin 3 (three) times daily with meals.  Marland Kitchen lisinopril (PRINIVIL,ZESTRIL) 2.5 MG tablet Take 2.5 mg by mouth every evening.   . metFORMIN  (GLUCOPHAGE) 500  MG tablet Take 1,000 mg by mouth 2 (two) times daily.  . metoprolol succinate (TOPROL-XL) 25 MG 24 hr tablet Take 1 tablet by mouth 2 (two) times daily.  . ondansetron (ZOFRAN) 4 MG tablet Take 4 mg by mouth 4 (four) times daily as needed.  . Oxycodone HCl 10 MG TABS Take 10 mg by mouth every 4 (four) hours as needed (for pain).   . pravastatin (PRAVACHOL) 40 MG tablet Take 1 tablet (40 mg total) by mouth every evening.  . senna-docusate (SENOKOT-S) 8.6-50 MG tablet Take 1 tablet by mouth at bedtime as needed for moderate constipation.  . [DISCONTINUED] Continuous Blood Gluc Transmit (DEXCOM G6 TRANSMITTER) MISC 1 Piece by Does not apply route as directed.   No facility-administered encounter medications on file as of 04/11/2019.    ALLERGIES: Allergies  Allergen Reactions  . Peanut-Containing Drug Products Anaphylaxis and Hives    Patient states she is allergic to all nuts  . Shellfish Allergy Anaphylaxis  . Gluten Meal   . Latex Other (See Comments)    Reaction: blistering  . Prednisone Hives and Other (See Comments)    Reaction: causes psychological issues   . Nexium [Esomeprazole Magnesium] Hives  . Tape Rash    Paper tape please    VACCINATION STATUS: Immunization History  Administered Date(s) Administered  . Influenza Split 04/17/2012  . Pneumococcal Polysaccharide-23 04/17/2012    Diabetes She presents for her follow-up diabetic visit. She has type 2 diabetes mellitus. Onset time: She was diagnosed at approximate age of 59 years. Her disease course has been worsening. There are no hypoglycemic associated symptoms. Pertinent negatives for hypoglycemia include no confusion, pallor or seizures. Associated symptoms include fatigue, polydipsia and polyuria. Pertinent negatives for diabetes include no polyphagia. There are no hypoglycemic complications. Symptoms are worsening. There are no diabetic complications. Risk factors for coronary artery disease include  diabetes mellitus, hypertension, obesity, sedentary lifestyle and dyslipidemia. Current diabetic treatment includes insulin injections. She is compliant with treatment most of the time. She is following a generally healthy (she admits to consumption of large quantities of soda.) diet. When asked about meal planning, she reported none. She has not had a previous visit with a dietitian. She never participates in exercise. Her home blood glucose trend is decreasing steadily. Her breakfast blood glucose range is generally 180-200 mg/dl. Her lunch blood glucose range is generally >200 mg/dl. Her dinner blood glucose range is generally >200 mg/dl. Her bedtime blood glucose range is generally >200 mg/dl. Her overall blood glucose range is >200 mg/dl. (Despite high-dose insulin, she still has significantly above target glycemic profile.  ) An ACE inhibitor/angiotensin II receptor blocker is being taken.  Hyperlipidemia This is a chronic problem. The current episode started more than 1 year ago. The problem is uncontrolled. Exacerbating diseases include diabetes and obesity. Pertinent negatives include no myalgias. Current antihyperlipidemic treatment includes statins. Risk factors for coronary artery disease include dyslipidemia, diabetes mellitus, hypertension, obesity and a sedentary lifestyle.    Review of systems: Limited as above   Objective:    There were no vitals taken for this visit.  Wt Readings from Last 3 Encounters:  03/27/19 197 lb 6.4 oz (89.5 kg)  03/11/19 190 lb (86.2 kg)  11/30/18 194 lb (88 kg)     Diabetic Labs (most recent): Lab Results  Component Value Date   HGBA1C 14.7 (H) 03/12/2019   HGBA1C >14.0 (H) 11/01/2018   HGBA1C 13.9 (H) 07/05/2018     Lipid Panel ( most  recent) Lipid Panel     Component Value Date/Time   CHOL 146 03/12/2019 0432   TRIG 105 03/12/2019 0432   HDL 44 03/12/2019 0432   CHOLHDL 3.3 03/12/2019 0432   VLDL 21 03/12/2019 0432   LDLCALC 81  03/12/2019 0432   LDLCALC 116 (H) 04/28/2017 1101     Lab Results  Component Value Date   TSH 0.31 (L) 04/28/2017   TSH 0.667 04/16/2012   FREET4 1.1 04/28/2017       Assessment & Plan:   1. 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 -Her recent A1c of 14.7%.   -her diabetes is complicated by CVA, obesity/ sedentary life and Tahiri C Shepheard remains at extremely high risk for more acute and chronic complications which include CAD, CVA, CKD, retinopathy, and neuropathy. These are all discussed in detail with the patient.  - I have counseled her on diet management and weight loss, by adopting a carbohydrate restricted/protein rich diet.  She admits to continued consumption of large quantities of soda, which explains the extremely high insulin resistance in her case.  - she  admits there is a room for improvement in her diet and drink choices. -  Suggestion is made for her to avoid simple carbohydrates  from her diet including Cakes, Sweet Desserts / Pastries, Ice Cream, Soda (diet and regular), Sweet Tea, Candies, Chips, Cookies, Sweet Pastries,  Store Bought Juices, Alcohol in Excess of  1-2 drinks a day, Artificial Sweeteners, Coffee Creamer, and "Sugar-free" Products. This will help patient to have stable blood glucose profile and potentially avoid unintended weight gain.   - I encouraged her to switch to  unprocessed or minimally processed complex starch and increased protein intake (animal or plant source), fruits, and vegetables.  - she is advised to stick to a routine mealtimes to eat 3 meals  a day and avoid unnecessary snacks ( to snack only to correct hypoglycemia).   - I have approached her with the following individualized plan to manage diabetes and patient agrees:     She is at extremely  high risk for hypoglycemia from inadvertent use of insulin, especially if she decides to  decrease her carbs consumption.  -Based on her current glycemic profile, she will continue to need high-dose insulin in order for her to achieve and maintain control of diabetes to target. -She is advised to continue  Humulin U500 180 units 3 times a day  20 minutes before her 3  major meals, associated with strict monitoring of blood glucose 4 times a day minimum.  I advised her to rotate her injection site.  She wears a Dexcom CGM device, advised to wear it at all times.     -Once again, she is encouraged to call clinic for blood glucose levels less than 70 or above 300 mg /dl. -She is advised to continue metformin 1000 mg p.o. twice daily , therapeutically suitable for patient . -She is also advised to continue Trulicity 1.5 mg subcutaneously weekly.    -  Side effects and precautions discussed with her. -She is advised to continue glipizide  10 mg XL p.o. daily at breakfast.     2) Lipids/HPL: Her last lipid panel showed improving LDL of 116, improving from 141.  She is advised to continue atorvastatin 20 mg p.o. nightly.           Side effects and precautions discussed with her.  3) hypertension: Her blood pressure is controlled to target. She is advised to continue lisinopril 2.5 mg p.o. daily.     - I advised patient to maintain close follow up with Redmond [employer], MD for primary care needs.  - Time spent on this patient care encounter:  35 min, of which >50% was spent in  counseling and the rest reviewing her  current and  previous labs/studies ( including abstraction from other facilities),  previous treatments, her blood glucose readings, and medications' doses and developing a plan for long-term care based on the latest recommendations for standards of care; and documenting her care.  Zanayah Kirt Boys participated in the discussions, expressed understanding, and voiced agreement with the above plans.  All questions were answered to her satisfaction. she is encouraged to contact  clinic should she have any questions or concerns prior to her return visit.   Follow up plan: - Return in about 3 months (around 07/10/2019) for Bring Meter and Logs- A1c in Office, Include 8 log sheets.  Glade Lloyd, MD [facility] [address] [place], [place] [address] Phone: [phone]  Fax: [phone]    04/11/2019, 12:30 PM  This note was partially dictated with voice recognition software. Similar sounding words can be transcribed inadequately or may not  be corrected upon review.

## 2019-04-14 ENCOUNTER — Other Ambulatory Visit: Payer: Self-pay | Admitting: "Endocrinology

## 2019-04-14 ENCOUNTER — Telehealth: Payer: Self-pay | Admitting: "Endocrinology

## 2019-04-14 MED ORDER — DEXCOM G6 SENSOR MISC: 4.0000 | 2 refills | Status: DC

## 2019-04-14 NOTE — Telephone Encounter (Signed)
done

## 2019-04-14 NOTE — Telephone Encounter (Signed)
Pt requesting Continuous Blood Gluc Sensor (DEXCOM G6 SENSOR) MISC. [facility] [employer]. Last sent to [facility] in December, they do not carry these

## 2019-04-29 ENCOUNTER — Other Ambulatory Visit: Payer: Self-pay | Admitting: Internal Medicine

## 2019-04-29 DIAGNOSIS — Z1231 Encounter for screening mammogram for malignant neoplasm of breast: Secondary | ICD-10-CM

## 2019-05-04 ENCOUNTER — Telehealth: Payer: Self-pay

## 2019-05-04 NOTE — Telephone Encounter (Signed)
Virtual Visit Pre-Appointment Phone Call  "(Name), I am calling you today to discuss your upcoming appointment. We are currently trying to limit exposure to the virus that causes COVID-19 by seeing patients at home rather than in the office."  1. "What is the BEST phone number to call the day of the visit?" - include this in appointment notes  2. "Do you have or have access to (through a family member/friend) a smartphone with video capability that we can use for your visit?" a. If yes - list this number in appt notes as "cell" (if different from BEST phone #) and list the appointment type as a VIDEO visit in appointment notes b. If no - list the appointment type as a PHONE visit in appointment notes  Confirm consent - "In the setting of the current Covid19 crisis, you are scheduled for a (phone or video) visit with your provider on (date) at (time).  Just as we do with many in-office visits, in order for you to participate in this visit, we must obtain consent.  If you'd like, I can send this to your mychart (if signed up) or email for you to review.  Otherwise, I can obtain your verbal consent now.  All virtual visits are billed to your insurance company just like a normal visit would be.  By agreeing to a virtual visit, we'd like you to understand that the technology does not allow for your provider to perform an examination, and thus may limit your provider's ability to fully assess your condition. If your provider identifies any concerns that need to be evaluated in person, we will make arrangements to do so.  Finally, though the technology is pretty good, we cannot assure that it will always work on either your or our end, and in the setting of a video visit, we may have to convert it to a phone-only visit.  In either situation, we cannot ensure that we have a secure connection.  Are you willing to proceed?" STAFF: Did the patient verbally acknowledge consent to telehealth visit? Document  YES/NO here:  3. Advise patient to be prepared - "Two hours prior to your appointment, go ahead and check your blood pressure, pulse, oxygen saturation, and your weight (if you have the equipment to check those) and write them all down. When your visit starts, your provider will ask you for this information. If you have an Apple Watch or Kardia device, please plan to have heart rate information ready on the day of your appointment. Please have a pen and paper handy nearby the day of the visit as well."  4. Give patient instructions for MyChart download to smartphone OR Doximity/Doxy.me as below if video visit (depending on what platform provider is using)  5. Inform patient they will receive a phone call 15 minutes prior to their appointment time (may be from unknown caller ID) so they should be prepared to answer    TELEPHONE CALL NOTE  [name] has been deemed a candidate for a follow-up tele-health visit to limit community exposure during the Covid-19 pandemic. I spoke with the patient via phone to ensure availability of phone/video source, confirm preferred email & phone number, and discuss instructions and expectations.  I reminded [name] to be prepared with any vital sign and/or heart rhythm information that could potentially be obtained via home monitoring, at the time of her visit. I reminded [name] to expect a phone call prior to her visit.  [name] 05/04/2019 11:27 AM   INSTRUCTIONS FOR DOWNLOADING THE MYCHART APP TO SMARTPHONE  - The patient must first make sure to have activated MyChart and know their login information - If Apple, go to [employer] and type in MyChart in the search bar and download the app. If Android, ask patient to go to [employer] and type in [place] in the search bar and download the app. The app is free but as with any other app downloads, their phone may require them to verify saved payment information or Apple/Android password.  -  The patient will need to then log into the app with their MyChart username and password, and select [facility] as their healthcare provider to link the account. When it is time for your visit, go to the MyChart app, find appointments, and click Begin Video Visit. Be sure to Select Allow for your device to access the Microphone and Camera for your visit. You will then be connected, and your provider will be with you shortly.  **If they have any issues connecting, or need assistance please contact MyChart service desk [phone])**  **If using a computer, in order to ensure the best quality for their visit they will need to use either of the following Internet Browsers: [employer], or Google Chrome**  IF USING DOXIMITY or DOXY.ME - The patient will receive a link just prior to their visit by text.     FULL LENGTH CONSENT FOR TELE-HEALTH VISIT   I hereby voluntarily request, consent and authorize [facility] and its employed or contracted physicians, physician assistants, nurse practitioners or other licensed health care professionals (the Practitioner), to provide me with telemedicine health care services (the "Services") as deemed necessary by the treating Practitioner. I acknowledge and consent to receive the Services by the Practitioner via telemedicine. I understand that the telemedicine visit will involve communicating with the Practitioner through live audiovisual communication technology and the disclosure of certain medical information by electronic transmission. I acknowledge that I have been given the opportunity to request an in-person assessment or other available alternative prior to the telemedicine visit and am voluntarily participating in the telemedicine visit.  I understand that I have the right to withhold or withdraw my consent to the use of telemedicine in the course of my care at any time, without affecting my right to future care or treatment, and that the  Practitioner or I may terminate the telemedicine visit at any time. I understand that I have the right to inspect all information obtained and/or recorded in the course of the telemedicine visit and may receive copies of available information for a reasonable fee.  I understand that some of the potential risks of receiving the Services via telemedicine include:  Marland Kitchen Delay or interruption in medical evaluation due to technological equipment failure or disruption; . Information transmitted may not be sufficient (e.g. poor resolution of images) to allow for appropriate medical decision making by the Practitioner; and/or  . In rare instances, security protocols could fail, causing a breach of personal health information.  Furthermore, I acknowledge that it is my responsibility to provide information about my medical history, conditions and care that is complete and accurate to the best of my ability. I acknowledge that Practitioner's advice, recommendations, and/or decision may be based on factors not within their control, such as incomplete or inaccurate data provided by me or distortions of diagnostic images or specimens that may result from electronic transmissions. I understand that the practice  of medicine is not an Chief Strategy Officer and that Practitioner makes no warranties or guarantees regarding treatment outcomes. I acknowledge that I will receive a copy of this consent concurrently upon execution via email to the email address I last provided but may also request a printed copy by calling the office of [facility].    I understand that my insurance will be billed for this visit.   I have read or had this consent read to me. . I understand the contents of this consent, which adequately explains the benefits and risks of the Services being provided via telemedicine.  . I have been provided ample opportunity to ask questions regarding this consent and the Services and have had my questions answered to my  satisfaction. . I give my informed consent for the services to be provided through the use of telemedicine in my medical care  By participating in this telemedicine visit I agree to the above.

## 2019-05-05 ENCOUNTER — Encounter: Payer: Self-pay | Admitting: Student

## 2019-05-05 ENCOUNTER — Telehealth (INDEPENDENT_AMBULATORY_CARE_PROVIDER_SITE_OTHER): Payer: BC Managed Care – PPO | Admitting: Student

## 2019-05-05 VITALS — BP 130/90 | HR 100 | Ht 65.0 in | Wt 195.0 lb

## 2019-05-05 DIAGNOSIS — I10 Essential (primary) hypertension: Secondary | ICD-10-CM

## 2019-05-05 DIAGNOSIS — R002 Palpitations: Secondary | ICD-10-CM

## 2019-05-05 DIAGNOSIS — E1165 Type 2 diabetes mellitus with hyperglycemia: Secondary | ICD-10-CM

## 2019-05-05 DIAGNOSIS — Z8673 Personal history of transient ischemic attack (TIA), and cerebral infarction without residual deficits: Secondary | ICD-10-CM

## 2019-05-05 MED ORDER — METOPROLOL TARTRATE 25 MG PO TABS: 25.0000 mg | ORAL_TABLET | Freq: Two times a day (BID) | ORAL | 3 refills | Status: DC

## 2019-05-05 NOTE — Patient Instructions (Signed)
Medication Instruction: Your physician has recommended you make the following change in your medication:  Restart Lopressor 25 mg Two Times Daily    Labwork: NONE  Testing/Procedures:. Your physician has requested that you have a carotid duplex. This test is an ultrasound of the carotid arteries in your neck. It looks at blood flow through these arteries that supply the brain with blood. Allow one hour for this exam. There are no restrictions or special instructions.   Follow-Up: Your physician wants you to follow-up in:6 Months with Dr. Lurena Joiner will receive a reminder letter in the mail two months in advance. If you don't receive a letter, please call our office to schedule the follow-up appointment.   Any Other Special Instructions Will Be Listed Below (If Applicable).     If you need a refill on your cardiac medications before your next appointment, please call your pharmacy.  Thank you for choosing [facility]!

## 2019-05-05 NOTE — Progress Notes (Signed)
Virtual Visit via Telephone Note   This visit type was conducted due to national recommendations for restrictions regarding the COVID-19 Pandemic (e.g. social distancing) in an effort to limit this patient's exposure and mitigate transmission in our community.  Due to her co-morbid illnesses, this patient is at least at moderate risk for complications without adequate follow up.  This format is felt to be most appropriate for this patient at this time.  The patient did not have access to video technology/had technical difficulties with video requiring transitioning to audio format only (telephone).  All issues noted in this document were discussed and addressed.  No physical exam could be performed with this format.  Please refer to the patient's chart for her  consent to telehealth for [facility].   Date:  05/05/2019   ID:  [name], DOB 1966-02-28, MRN [id number]  Patient Location: Home Provider Location: Office  PCP:  Redmond School, MD  Cardiologist:  Carlyle Dolly, MD Electrophysiologist:  None   Evaluation Performed:  Follow-Up Visit  Chief Complaint: 31-monthvisit  History of Present Illness:    Debbie VANDUZERis a 54y.o. female with past medical history of prior chest pain (negative stress echocardiogram in 2018), HTN, HLD, IDDM, MS and palpitations (monitor in 10/2018 showed PAC's and PVC's) who presents for a 647-monthollow-up telehealth visit.  She most recently had a phone visit with myself in 11/2018 and reported having palpitations occurring several days per week which could last from seconds to minutes at a time. She was started on Lopressor 12.5 mg twice daily to take for 2 weeks and then could titrate to 25 mg twice daily if symptoms persisted.  In the interim, [facility] for evaluation of acute left-sided facial weakness and blurred vision. CT Head showed no acute intracranial abnormalities and MRI was unable to be  performed due to spinal cord stimulator. 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 Hemoglobin A1c remained elevated to 14.7 and close outpatient follow-up with Endocrinology was recommended.  In talking with the patient today, she reports still having some residual dysphasia ever since her CVA in 02/2019. She also experiences intermittent blurred vision at times and has follow-up with Ophthalmology scheduled for next week. She was supposed to have carotid dopplers performed as an outpatient but these have not yet been scheduled.  She denies any recent chest pain or dyspnea on exertion. She reports her palpitations significantly improved with Lopressor but she ran out of the medication and did not have refills. She has been trying to monitor her carbohydrates and blood sugar closely. Limits caffeine intake.   The patient does not have symptoms concerning for COVID-19 infection (fever, chills, cough, or new shortness of breath).    Past Medical History:  Diagnosis Date  . Asthma   . Chronic abdominal pain   . Chronic neck pain    C4-6 fusion  . CONSTIPATION 05/16/2009   Qualifier: Diagnosis of  By: JoRonnald RampNP-BC, Kandice L   . Diabetes mellitus without complication ([facility]  . Dysphagia 02/23/2012  . Fatty liver   . GERD (gastroesophageal reflux disease)   . Helicobacter pylori gastritis 2011  . Hyperlipidemia   . Hypertension   . LIVER FUNCTION TESTS, ABNORMAL, HX OF 05/14/2009  Qualifier: Diagnosis of  By: Stephan Minister    . Migraine headache   . MIGRAINE, COMMON 05/14/2009   Qualifier: Diagnosis of  By: Stephan Minister    . MS (multiple sclerosis) ([place])   . Sleep apnea    Past Surgical History:  Procedure Laterality Date  . ABDOMINAL HYSTERECTOMY    . APPENDECTOMY    . BACK SURGERY  03/07/2012   Nerve Stimulator  .  CHOLECYSTECTOMY    . COLONOSCOPY  07/09/2009   RMR; normal rectum aside from anal canal hemorrhoids/scattered left-sided diverticula  . ESOPHAGOGASTRODUODENOSCOPY  05/22/2009   RMR; normal /small HH  . ESOPHAGOGASTRODUODENOSCOPY  2007   RMR: non-critical Schatzki's rin, non-maniuplated  . ESOPHAGOGASTRODUODENOSCOPY N/A 10/24/2013   Procedure: ESOPHAGOGASTRODUODENOSCOPY (EGD);  Surgeon: Daneil Dolin, MD;  [facility];  Service: Endoscopy;  Laterality: N/A;  9:45  . ESOPHAGOGASTRODUODENOSCOPY (EGD) WITH ESOPHAGEAL DILATION  03/17/2012   DJT:[id number] appearing esophageal mucosa of uncertain significance. Status post Venia Minks dilation followed by esophageal bx  . MALONEY DILATION N/A 10/24/2013   Procedure: Venia Minks DILATION;  Surgeon: Daneil Dolin, MD;  Location: [facility];  Service: Endoscopy;  Laterality: N/A;  . MANDIBLE SURGERY    . NECK SURGERY     X2, last time 2013     Current Meds  Medication Sig  . albuterol (VENTOLIN HFA) 108 (90 Base) MCG/ACT inhaler Inhale 1-2 puffs into the lungs every 6 (six) hours as needed for wheezing or shortness of breath.   . baclofen (LIORESAL) 20 MG tablet Take 20 mg by mouth 3 (three) times daily.   . blood glucose meter kit and supplies KIT Dispense based on patient and insurance preference. Use up to four times daily as directed. (FOR ICD-9 250.00, 250.01).  . busPIRone (BUSPAR) 10 MG tablet Take 10 mg by mouth daily as needed. For anxiety  . citalopram (CELEXA) 40 MG tablet Take 40 mg by mouth every evening.   . clopidogrel (PLAVIX) 75 MG tablet Take 1 tablet (75 mg total) by mouth daily.  . Continuous Blood Gluc Sensor (DEXCOM G6 SENSOR) MISC 4 Pieces by Does not apply route once a week.  . Continuous Blood Gluc Transmit (DEXCOM G6 TRANSMITTER) MISC 1 Piece by Does not apply route as directed.  . gabapentin (NEURONTIN) 800 MG tablet Take 800 mg by mouth 4 (four) times daily.  Marland Kitchen glipiZIDE (GLUCOTROL XL) 10 MG 24 hr tablet Take 1  tablet (10 mg total) by mouth daily with breakfast.  . insulin regular human CONCENTRATED (HUMULIN R U-500 KWIKPEN) 500 UNIT/ML kwikpen Inject 180 Units into the skin 3 (three) times daily with meals.  Marland Kitchen lisinopril (PRINIVIL,ZESTRIL) 2.5 MG tablet Take 2.5 mg by mouth every evening.   . metFORMIN (GLUCOPHAGE) 500 MG tablet Take 1,000 mg by mouth 2 (two) times daily.  . ondansetron (ZOFRAN) 4 MG tablet Take 4 mg by mouth 4 (four) times daily as needed.  . Oxycodone HCl 10 MG TABS Take 10 mg by mouth every 4 (four) hours as needed (for pain).   . pravastatin (PRAVACHOL) 40 MG tablet Take 1 tablet (40 mg total) by mouth every evening.     Allergies:   Peanut-containing drug products, Shellfish allergy, Gluten meal, Latex, Prednisone, Nexium [esomeprazole magnesium], and Tape   Social History   Tobacco Use  . Smoking status: Never Smoker  . Smokeless tobacco: Never Used  Substance Use Topics  . Alcohol use: No  . Drug use: No     Family Hx:  The patient's family history includes Cancer in her mother; Diabetes in her father and sister. There is no history of Colon cancer.  ROS:   Please see the history of present illness.     All other systems reviewed and are negative.   Prior CV studies:   The following studies were reviewed today:  Event Monitor: 10/2018  7 day holter monitor  Min HR 63, Max HR 145, Avg HR 99  Rare ventricular ectopy, isolated PVC  Rare supraventricualr ectopy in the form of isolated PACs and couplets  No diary submitted  No significant arrhythmias  Echocardiogram: 02/2019 IMPRESSIONS    1. Left ventricular ejection fraction, by visual estimation, is 60 to  65%. The left ventricle has normal function. There is mildly increased  left ventricular hypertrophy.  2. Left ventricular diastolic parameters are consistent with Grade I  diastolic dysfunction (impaired relaxation).  3. The left ventricle has no regional wall motion abnormalities.  4.  Global right ventricle has normal systolic function.The right  ventricular size is normal. No increase in right ventricular wall  thickness.  5. Left atrial size was normal.  6. Right atrial size was normal.  7. The mitral valve is normal in structure. No evidence of mitral valve  regurgitation. No evidence of mitral stenosis.  8. The tricuspid valve is normal in structure. Tricuspid valve  regurgitation is not demonstrated.  9. The aortic valve is tricuspid. Aortic valve regurgitation is not  visualized. No evidence of aortic valve sclerosis or stenosis.  10. The pulmonic valve was not well visualized. Pulmonic valve  regurgitation is not visualized.  11. The inferior vena cava is normal in size with greater than 50%  respiratory variability, suggesting right atrial pressure of 3 mmHg.   Labs/Other Tests and Data Reviewed:    EKG:  An ECG dated 03/11/2019 was personally reviewed today and demonstrated:  NSR, HR 93 with LAD. No acute ST abnormalities.   Recent Labs: 03/11/2019: ALT 61; BUN 10; Creatinine, Ser 0.84; Hemoglobin 12.7; Platelets 183; Potassium 3.7; Sodium 134   Recent Lipid Panel Lab Results  Component Value Date/Time   CHOL 146 03/12/2019 04:32 AM   TRIG 105 03/12/2019 04:32 AM   HDL 44 03/12/2019 04:32 AM   CHOLHDL 3.3 03/12/2019 04:32 AM   LDLCALC 81 03/12/2019 04:32 AM   LDLCALC 116 (H) 04/28/2017 11:01 AM    Wt Readings from Last 3 Encounters:  05/05/19 195 lb (88.5 kg)  03/27/19 197 lb 6.4 oz (89.5 kg)  03/11/19 190 lb (86.2 kg)     Objective:    Vital Signs:  BP 130/90   Pulse 100   Ht '5\' 5"'  (1.651 m)   Wt 195 lb (88.5 kg)   BMI 32.45 kg/m    General: Pleasant female sounding in NAD Psych: Normal affect. Neuro: Alert and oriented X 3. Lungs:  Resp regular and unlabored while talking on the phone.   ASSESSMENT & PLAN:    1. Palpitations - Prior monitor last year showed occasional PAC's and PVC's but no significant arrhythmias. Symptoms  mostly resolved while on Lopressor but she reports having run out of the medication and did not have refills. Will plan to restart Lopressor at her prior dosing of 25 mg twice daily. She was encouraged to continue to reduce caffeine intake.  2. HTN - BP was at 130/90 on most recent check. Will continue Lisinopril 2.5 mg daily and plan to restart Lopressor 25 mg twice daily as outlined above.  3.  Uncontrolled IDDM - Hgb A1c elevated to 14.7 on most recent check. She is followed by Endocrinology.   4. History of CVA - Her episode in 02/2019 was thought to be most consistent with a CVA but an MRI was unable to be performed given her spinal stimulator. She was evaluated by Neurology and was on ASA and Plavix for 3 weeks and now on Plavix alone. - She was supposed to have carotid dopplers as an outpatient and these were ordered by her PCP but never scheduled. Will place the order again for carotid dopplers today.   COVID-19 Education: The signs and symptoms of COVID-19 were discussed with the patient and how to seek care for testing (follow up with PCP or arrange E-visit).  The importance of social distancing was discussed today.  Time:   Today, I have spent 16 minutes with the patient with telehealth technology discussing the above problems.     Medication Adjustments/Labs and Tests Ordered: Current medicines are reviewed at length with the patient today.  Concerns regarding medicines are outlined above.   Tests Ordered: No orders of the defined types were placed in this encounter.   Medication Changes: No orders of the defined types were placed in this encounter.   Follow Up:  In Person in 6 month(s)  Signed, Erma Heritage, PA-C  05/05/2019 11:55 AM    [place]

## 2019-05-11 ENCOUNTER — Other Ambulatory Visit: Payer: Self-pay

## 2019-05-11 ENCOUNTER — Ambulatory Visit (HOSPITAL_COMMUNITY)
Admission: RE | Admit: 2019-05-11 | Discharge: 2019-05-11 | Disposition: A | Discharge status: Home / Self Care | Payer: BC Managed Care – PPO | Source: Ambulatory Visit | Attending: Student | Admitting: Student

## 2019-05-11 DIAGNOSIS — Z8673 Personal history of transient ischemic attack (TIA), and cerebral infarction without residual deficits: Secondary | ICD-10-CM | POA: Insufficient documentation

## 2019-06-23 ENCOUNTER — Ambulatory Visit: Payer: BC Managed Care – PPO

## 2019-07-11 ENCOUNTER — Ambulatory Visit: Payer: BC Managed Care – PPO | Admitting: "Endocrinology

## 2019-11-14 NOTE — Progress Notes (Deleted)
Cardiology Office Note    Date:  11/14/2019   ID:  [name], DOB 03/16/1966, MRN [id number]  PCP:  Elfredia Nevins, MD  Cardiologist: Dina Rich, MD EPS: None  No chief complaint on file.   History of Present Illness:  [name] is a 54 y.o. female with history of hypertension, palpitations with PACs and PVCs on Holter 10/2018 treated with metoprolol, history of CVA 02/2019 on Plavix pain with negative stress echo 2018  Patient saw Randall An, PA-C 05/05/2019 via telemedicine at which time she had run out of her metoprolol and this was restarted.   Past Medical History:  Diagnosis Date  . Asthma   . Chronic abdominal pain   . Chronic neck pain    C4-6 fusion  . CONSTIPATION 05/16/2009   Qualifier: Diagnosis of  By: Yetta Barre FNP-BC, Kandice L   . Diabetes mellitus without complication (H[facility])   . Dysphagia 02/23/2012  . Fatty liver   . GERD (gastroesophageal reflux disease)   . Helicobacter pylori gastritis 2011  . Hyperlipidemia   . Hypertension   . LIVER FUNCTION TESTS, ABNORMAL, HX OF 05/14/2009   Qualifier: Diagnosis of  By: Ricard Dillon    . Migraine headache   . MIGRAINE, COMMON 05/14/2009   Qualifier: Diagnosis of  By: Ricard Dillon    . MS (multiple sclerosis) (H[facility])   . Sleep apnea     Past Surgical History:  Procedure Laterality Date  . ABDOMINAL HYSTERECTOMY    . APPENDECTOMY    . BACK SURGERY  03/07/2012   Nerve Stimulator  . CHOLECYSTECTOMY    . COLONOSCOPY  07/09/2009   RMR; normal rectum aside from anal canal hemorrhoids/scattered left-sided diverticula  . ESOPHAGOGASTRODUODENOSCOPY  05/22/2009   RMR; normal /small HH  . ESOPHAGOGASTRODUODENOSCOPY  2007   RMR: non-critical Schatzki's rin, non-maniuplated  . ESOPHAGOGASTRODUODENOSCOPY N/A 10/24/2013   Procedure: ESOPHAGOGASTRODUODENOSCOPY (EGD);  Surgeon: Corbin Ade, MD;  [facility];  Service: Endoscopy;  Laterality: N/A;  9:45  . ESOPHAGOGASTRODUODENOSCOPY (EGD) WITH  ESOPHAGEAL DILATION  03/17/2012   ONG:[id number] appearing esophageal mucosa of uncertain significance. Status post [name] dilation followed by esophageal bx  . MALONEY DILATION N/A 10/24/2013   Procedure: [name] DILATION;  Surgeon: Corbin Ade, MD;  [facility];  Service: Endoscopy;  Laterality: N/A;  . MANDIBLE SURGERY    . NECK SURGERY     X2, last time 2013    Current Medications: No outpatient medications have been marked as taking for the 11/20/19 encounter (Appointment) with Dyann Kief, PA-C.     Allergies:   Peanut-containing drug products, Shellfish allergy, Gluten meal, Latex, Prednisone, Nexium [esomeprazole magnesium], and Tape   Social History   Socioeconomic History  . Marital status: Married    Spouse name: Not on file  . Number of children: Not on file  . Years of education: Not on file  . Highest education level: Not on file  Occupational History  . Not on file  Tobacco Use  . Smoking status: Never Smoker  . Smokeless tobacco: Never Used  Vaping Use  . Vaping Use: Never used  Substance and Sexual Activity  . Alcohol use: No  . Drug use: No  . Sexual activity: Yes    Birth control/protection: Surgical  Other Topics Concern  . Not on file  Social History Narrative   5 kids, raising grandchild   Social Determinants of Health   Financial Resource Strain:   . Difficulty of  Paying Living Expenses:   Food Insecurity:   . Worried About Programme researcher, broadcasting/film/video in the Last Year:   . Barista in the Last Year:   Transportation Needs:   . Freight forwarder (Medical):   Marland Kitchen Lack of Transportation (Non-Medical):   Physical Activity:   . Days of Exercise per Week:   . Minutes of Exercise per Session:   Stress:   . Feeling of Stress :   Social Connections:   . Frequency of Communication with Friends and Family:   . Frequency of Social Gatherings with Friends and Family:   . Attends Religious Services:   . Active Member of Clubs or  Organizations:   . Attends Banker Meetings:   Marland Kitchen Marital Status:      Family History:  The patient's ***family history includes Cancer in her mother; Diabetes in her father and sister.   ROS:   Please see the history of present illness.    ROS All other systems reviewed and are negative.   PHYSICAL EXAM:   VS:  There were no vitals taken for this visit.  Physical Exam  GEN: Well nourished, well developed, in no acute distress  HEENT: normal  Neck: no JVD, carotid bruits, or masses Cardiac:RRR; no murmurs, rubs, or gallops  Respiratory:  clear to auscultation bilaterally, normal work of breathing GI: soft, nontender, nondistended, + BS Ext: without cyanosis, clubbing, or edema, Good distal pulses bilaterally MS: no deformity or atrophy  Skin: warm and dry, no rash Neuro:  Alert and Oriented x 3, Strength and sensation are intact Psych: euthymic mood, full affect  Wt Readings from Last 3 Encounters:  05/05/19 195 lb (88.5 kg)  03/27/19 197 lb 6.4 oz (89.5 kg)  03/11/19 190 lb (86.2 kg)      Studies/Labs Reviewed:   EKG:  EKG is*** ordered today.  The ekg ordered today demonstrates ***  Recent Labs: 03/11/2019: ALT 61; BUN 10; Creatinine, Ser 0.84; Hemoglobin 12.7; Platelets 183; Potassium 3.7; Sodium 134   Lipid Panel    Component Value Date/Time   CHOL 146 03/12/2019 0432   TRIG 105 03/12/2019 0432   HDL 44 03/12/2019 0432   CHOLHDL 3.3 03/12/2019 0432   VLDL 21 03/12/2019 0432   LDLCALC 81 03/12/2019 0432   LDLCALC 116 (H) 04/28/2017 1101    Additional studies/ records that were reviewed today include:    Event Monitor: 10/2018  7 day holter monitor  Min HR 63, Max HR 145, Avg HR 99  Rare ventricular ectopy, isolated PVC  Rare supraventricualr ectopy in the form of isolated PACs and couplets  No diary submitted  No significant arrhythmias   Echocardiogram: 02/2019 IMPRESSIONS     1. Left ventricular ejection fraction, by visual  estimation, is 60 to  65%. The left ventricle has normal function. There is mildly increased  left ventricular hypertrophy.   2. Left ventricular diastolic parameters are consistent with Grade I  diastolic dysfunction (impaired relaxation).   3. The left ventricle has no regional wall motion abnormalities.   4. Global right ventricle has normal systolic function.The right  ventricular size is normal. No increase in right ventricular wall  thickness.   5. Left atrial size was normal.   6. Right atrial size was normal.   7. The mitral valve is normal in structure. No evidence of mitral valve  regurgitation. No evidence of mitral stenosis.   8. The tricuspid valve is normal in structure. Tricuspid valve  regurgitation is not demonstrated.   9. The aortic valve is tricuspid. Aortic valve regurgitation is not  visualized. No evidence of aortic valve sclerosis or stenosis.  10. The pulmonic valve was not well visualized. Pulmonic valve  regurgitation is not visualized.  11. The inferior vena cava is normal in size with greater than 50%  respiratory variability, suggesting right atrial pressure of 3 mmHg.        ASSESSMENT:    No diagnosis found.   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  Concerns regarding medicines are outlined above.  Medication changes, Labs and Tests ordered today are listed in the Patient Instructions below. There are no Patient Instructions on file for this visit.   Signed, Jacolyn Reedy, PA-C  11/14/2019 2:32 PM    [facility] [address] [place], [place], [place]  [address] Phone: [phone]; Fax: [phone]

## 2019-11-20 ENCOUNTER — Ambulatory Visit: Payer: BC Managed Care – PPO | Admitting: Physician Assistant

## 2019-11-20 DIAGNOSIS — R002 Palpitations: Secondary | ICD-10-CM

## 2019-11-20 DIAGNOSIS — I10 Essential (primary) hypertension: Secondary | ICD-10-CM

## 2019-11-20 DIAGNOSIS — Z8673 Personal history of transient ischemic attack (TIA), and cerebral infarction without residual deficits: Secondary | ICD-10-CM

## 2019-11-20 DIAGNOSIS — E785 Hyperlipidemia, unspecified: Secondary | ICD-10-CM

## 2019-11-20 DIAGNOSIS — Z87898 Personal history of other specified conditions: Secondary | ICD-10-CM

## 2019-12-05 ENCOUNTER — Other Ambulatory Visit: Payer: Self-pay

## 2019-12-05 ENCOUNTER — Other Ambulatory Visit (HOSPITAL_COMMUNITY): Payer: Self-pay | Admitting: Internal Medicine

## 2019-12-05 ENCOUNTER — Ambulatory Visit (HOSPITAL_COMMUNITY)
Admission: RE | Admit: 2019-12-05 | Discharge: 2019-12-05 | Disposition: A | Discharge status: Home / Self Care | Payer: BC Managed Care – PPO | Source: Ambulatory Visit | Attending: Internal Medicine | Admitting: Internal Medicine

## 2019-12-05 DIAGNOSIS — S99921A Unspecified injury of right foot, initial encounter: Secondary | ICD-10-CM

## 2019-12-07 ENCOUNTER — Ambulatory Visit (INDEPENDENT_AMBULATORY_CARE_PROVIDER_SITE_OTHER): Payer: BC Managed Care – PPO | Admitting: Orthopedic Surgery

## 2019-12-07 ENCOUNTER — Other Ambulatory Visit: Payer: Self-pay

## 2019-12-07 VITALS — BP 131/86 | HR 108 | Ht 65.0 in | Wt 185.0 lb

## 2019-12-07 DIAGNOSIS — S92514A Nondisplaced fracture of proximal phalanx of right lesser toe(s), initial encounter for closed fracture: Secondary | ICD-10-CM | POA: Diagnosis not present

## 2019-12-07 DIAGNOSIS — K760 Fatty (change of) liver, not elsewhere classified: Secondary | ICD-10-CM | POA: Insufficient documentation

## 2019-12-07 NOTE — Progress Notes (Signed)
NEW PROBLEM//OFFICE VISIT  Chief Complaint  Patient presents with  . Foot Injury    11/17/19 right foot     54 YO FEMALE PRESENTS FOR EVAL AND TX RIGHT SMALL TOE FX  Injury date was August 20 x-ray date was September 7 so 18 days and she still complaining of pain when she is walking says it is not that much better  He is a diabetic but has no open wounds around the fracture     Review of Systems  Skin: Negative.   Neurological: Negative for tingling.     Past Medical History:  Diagnosis Date  . Asthma   . Chronic abdominal pain   . Chronic neck pain    C4-6 fusion  . CONSTIPATION 05/16/2009   Qualifier: Diagnosis of  By: Ronnald Ramp FNP-BC, Kandice L   . Diabetes mellitus without complication ([place])   . Dysphagia 02/23/2012  . Fatty liver   . GERD (gastroesophageal reflux disease)   . Helicobacter pylori gastritis 2011  . Hyperlipidemia   . Hypertension   . LIVER FUNCTION TESTS, ABNORMAL, HX OF 05/14/2009   Qualifier: Diagnosis of  By: Stephan Minister    . Migraine headache   . MIGRAINE, COMMON 05/14/2009   Qualifier: Diagnosis of  By: Stephan Minister    . MS (multiple sclerosis) ([place])   . Sleep apnea     Past Surgical History:  Procedure Laterality Date  . ABDOMINAL HYSTERECTOMY    . APPENDECTOMY    . BACK SURGERY  03/07/2012   Nerve Stimulator  . CHOLECYSTECTOMY    . COLONOSCOPY  07/09/2009   RMR; normal rectum aside from anal canal hemorrhoids/scattered left-sided diverticula  . ESOPHAGOGASTRODUODENOSCOPY  05/22/2009   RMR; normal /small HH  . ESOPHAGOGASTRODUODENOSCOPY  2007   RMR: non-critical Schatzki's rin, non-maniuplated  . ESOPHAGOGASTRODUODENOSCOPY N/A 10/24/2013   Procedure: ESOPHAGOGASTRODUODENOSCOPY (EGD);  Surgeon: Daneil Dolin, MD;  [facility];  Service: Endoscopy;  Laterality: N/A;  9:45  . ESOPHAGOGASTRODUODENOSCOPY (EGD) WITH ESOPHAGEAL DILATION  03/17/2012   GUR:KYHCWCBJ appearing esophageal mucosa of uncertain significance.  Status post Venia Minks dilation followed by esophageal bx  . MALONEY DILATION N/A 10/24/2013   Procedure: Venia Minks DILATION;  Surgeon: Daneil Dolin, MD;  [facility];  Service: Endoscopy;  Laterality: N/A;  . MANDIBLE SURGERY    . NECK SURGERY     X2, last time 2013    Family History  Problem Relation Age of Onset  . Cancer Mother        unknown type  . Diabetes Father   . Diabetes Sister   . Colon cancer Neg Hx    Social History   Tobacco Use  . Smoking status: Never Smoker  . Smokeless tobacco: Never Used  Vaping Use  . Vaping Use: Never used  Substance Use Topics  . Alcohol use: No  . Drug use: No    Allergies  Allergen Reactions  . Peanut-Containing Drug Products Anaphylaxis and Hives    Patient states she is allergic to all nuts  . Shellfish Allergy Anaphylaxis  . Gluten Meal   . Latex Other (See Comments)    Reaction: blistering  . Prednisone Hives and Other (See Comments)    Reaction: causes psychological issues   . Nexium [Esomeprazole Magnesium] Hives  . Tape Rash    Paper tape please    Current Meds  Medication Sig  . albuterol (VENTOLIN HFA) 108 (90 Base) MCG/ACT inhaler Inhale 1-2 puffs into the lungs every 6 (  six) hours as needed for wheezing or shortness of breath.   . baclofen (LIORESAL) 20 MG tablet Take 20 mg by mouth 3 (three) times daily.   . blood glucose meter kit and supplies KIT Dispense based on patient and insurance preference. Use up to four times daily as directed. (FOR ICD-9 250.00, 250.01).  . busPIRone (BUSPAR) 10 MG tablet Take 10 mg by mouth daily as needed. For anxiety  . citalopram (CELEXA) 40 MG tablet Take 40 mg by mouth every evening.   . clopidogrel (PLAVIX) 75 MG tablet Take 1 tablet (75 mg total) by mouth daily.  . Continuous Blood Gluc Sensor (DEXCOM G6 SENSOR) MISC 4 Pieces by Does not apply route once a week.  . Continuous Blood Gluc Transmit (DEXCOM G6 TRANSMITTER) MISC 1 Piece by Does not apply route as  directed.  . gabapentin (NEURONTIN) 800 MG tablet Take 800 mg by mouth 4 (four) times daily.  Marland Kitchen glipiZIDE (GLUCOTROL XL) 10 MG 24 hr tablet Take 1 tablet (10 mg total) by mouth daily with breakfast.  . insulin regular human CONCENTRATED (HUMULIN R U-500 KWIKPEN) 500 UNIT/ML kwikpen Inject 180 Units into the skin 3 (three) times daily with meals.  Marland Kitchen lisinopril (PRINIVIL,ZESTRIL) 2.5 MG tablet Take 2.5 mg by mouth every evening.   . metFORMIN (GLUCOPHAGE) 500 MG tablet Take 1,000 mg by mouth 2 (two) times daily.  . metoprolol tartrate (LOPRESSOR) 25 MG tablet Take 1 tablet (25 mg total) by mouth 2 (two) times daily.  . ondansetron (ZOFRAN) 4 MG tablet Take 4 mg by mouth 4 (four) times daily as needed.  . Oxycodone HCl 10 MG TABS Take 10 mg by mouth every 4 (four) hours as needed (for pain).   . pravastatin (PRAVACHOL) 40 MG tablet Take 1 tablet (40 mg total) by mouth every evening.    BP 131/86   Pulse (!) 108   Ht '5\' 5"'  (1.651 m)   Wt 185 lb (83.9 kg)   BMI 30.79 kg/m   Physical Exam Constitutional:      General: She is not in acute distress.    Appearance: She is well-developed.  Cardiovascular:     Comments: No peripheral edema Skin:    General: Skin is warm and dry.  Neurological:     Mental Status: She is alert and oriented to person, place, and time.     Sensory: No sensory deficit.     Coordination: Coordination normal.     Gait: Gait abnormal.     Deep Tendon Reflexes: Reflexes are normal and symmetric.     Ortho Exam RIGHT FOOT SMALL TOE S TENDER YES, SWOLLEN YES ROM LIMITED STABLE MOTOR NORMAL  SKIN normal  MEDICAL DECISION MAKING  A.  Encounter Diagnosis  Name Primary?  . Closed nondisplaced fracture of proximal phalanx of lesser toe of right foot, initial encounter SMALL TOE Yes    B. DATA ANALYSED:   IMAGING: MY Interpretation of outside images: I have an x-ray dated December 05, 2018 monitor right foot it is the proximal phalanx that is fractured is  at the base of the phalanx there is slight angulation of the fracture.    C. MANAGEMENT   We placed her in a postop shoe to help with pain and ambulation  X-ray in 3 weeks No orders of the defined types were placed in this encounter.     Arther Abbott, MD  12/07/2019 10:22 AM

## 2019-12-20 ENCOUNTER — Other Ambulatory Visit: Payer: Self-pay

## 2019-12-20 ENCOUNTER — Encounter: Payer: Self-pay | Admitting: "Endocrinology

## 2019-12-20 ENCOUNTER — Ambulatory Visit: Payer: BC Managed Care – PPO | Admitting: Orthopedic Surgery

## 2019-12-20 ENCOUNTER — Ambulatory Visit (INDEPENDENT_AMBULATORY_CARE_PROVIDER_SITE_OTHER): Payer: BC Managed Care – PPO | Admitting: "Endocrinology

## 2019-12-20 VITALS — BP 104/70 | HR 92 | Ht 65.5 in | Wt 187.8 lb

## 2019-12-20 DIAGNOSIS — E782 Mixed hyperlipidemia: Secondary | ICD-10-CM

## 2019-12-20 DIAGNOSIS — I10 Essential (primary) hypertension: Secondary | ICD-10-CM

## 2019-12-20 DIAGNOSIS — Z6835 Body mass index (BMI) 35.0-35.9, adult: Secondary | ICD-10-CM

## 2019-12-20 DIAGNOSIS — E1159 Type 2 diabetes mellitus with other circulatory complications: Secondary | ICD-10-CM

## 2019-12-20 DIAGNOSIS — Z91199 Patient's noncompliance with other medical treatment and regimen due to unspecified reason: Secondary | ICD-10-CM

## 2019-12-20 DIAGNOSIS — Z9119 Patient's noncompliance with other medical treatment and regimen: Secondary | ICD-10-CM

## 2019-12-20 LAB — HEMOGLOBIN A1C: Hemoglobin A1C: 13.9

## 2019-12-20 MED ORDER — METFORMIN HCL 500 MG PO TABS
1000.0000 mg | ORAL_TABLET | Freq: Two times a day (BID) | ORAL | 1 refills | Status: DC
Start: 2019-12-20 — End: 2021-04-16

## 2019-12-20 MED ORDER — DEXCOM G6 SENSOR MISC
4.0000 | 2 refills | Status: DC
Start: 2019-12-20 — End: 2020-06-26

## 2019-12-20 MED ORDER — GLIPIZIDE ER 10 MG PO TB24
10.0000 mg | ORAL_TABLET | Freq: Every day | ORAL | 3 refills | Status: DC
Start: 2019-12-20 — End: 2021-04-16

## 2019-12-20 MED ORDER — TRULICITY 1.5 MG/0.5ML ~~LOC~~ SOAJ: 1.5000 mg | SUBCUTANEOUS | 2 refills | Status: DC

## 2019-12-20 MED ORDER — HUMULIN R U-500 KWIKPEN 500 UNIT/ML ~~LOC~~ SOPN: 60.0000 [IU] | PEN_INJECTOR | Freq: Three times a day (TID) | SUBCUTANEOUS | 2 refills | Status: DC

## 2019-12-20 MED ORDER — DEXCOM G6 TRANSMITTER MISC
1.0000 | 1 refills | Status: DC
Start: 2019-12-20 — End: 2020-06-26

## 2019-12-20 NOTE — Progress Notes (Signed)
12/20/2019, 2:21 PM    Endocrinology follow-up note    Subjective:    Patient ID: [name], female    DOB: 06-10-1965.  [name] is being seen in follow-up for management of currently uncontrolled symptomatic 2 diabetes, hyperlipidemia, hypertension. PMD:   Redmond School, MD.   Past Medical History:  Diagnosis Date  . Asthma   . Chronic abdominal pain   . Chronic neck pain    C4-6 fusion  . CONSTIPATION 05/16/2009   Qualifier: Diagnosis of  By: Ronnald Ramp FNP-BC, Kandice L   . Diabetes mellitus without complication ([place])   . Dysphagia 02/23/2012  . Fatty liver   . GERD (gastroesophageal reflux disease)   . Helicobacter pylori gastritis 2011  . Hyperlipidemia   . Hypertension   . LIVER FUNCTION TESTS, ABNORMAL, HX OF 05/14/2009   Qualifier: Diagnosis of  By: Stephan Minister    . Migraine headache   . MIGRAINE, COMMON 05/14/2009   Qualifier: Diagnosis of  By: Stephan Minister    . MS (multiple sclerosis) ([place])   . Sleep apnea    Past Surgical History:  Procedure Laterality Date  . ABDOMINAL HYSTERECTOMY    . APPENDECTOMY    . BACK SURGERY  03/07/2012   Nerve Stimulator  . CHOLECYSTECTOMY    . COLONOSCOPY  07/09/2009   RMR; normal rectum aside from anal canal hemorrhoids/scattered left-sided diverticula  . ESOPHAGOGASTRODUODENOSCOPY  05/22/2009   RMR; normal /small HH  . ESOPHAGOGASTRODUODENOSCOPY  2007   RMR: non-critical Schatzki's rin, non-maniuplated  . ESOPHAGOGASTRODUODENOSCOPY N/A 10/24/2013   Procedure: ESOPHAGOGASTRODUODENOSCOPY (EGD);  Surgeon: Daneil Dolin, MD;  [facility];  Service: Endoscopy;  Laterality: N/A;  9:45  . ESOPHAGOGASTRODUODENOSCOPY (EGD) WITH ESOPHAGEAL DILATION  03/17/2012   BTC:YELYHTMB appearing esophageal mucosa of uncertain significance. Status post Venia Minks dilation followed by esophageal bx  .  MALONEY DILATION N/A 10/24/2013   Procedure: Venia Minks DILATION;  Surgeon: Daneil Dolin, MD;  [facility];  Service: Endoscopy;  Laterality: N/A;  . MANDIBLE SURGERY    . NECK SURGERY     X2, last time 2013   Social History   Socioeconomic History  . Marital status: Married    Spouse name: Not on file  . Number of children: Not on file  . Years of education: Not on file  . Highest education level: Not on file  Occupational History  . Not on file  Tobacco Use  . Smoking status: Never Smoker  . Smokeless tobacco: Never Used  Vaping Use  . Vaping Use: Never used  Substance and Sexual Activity  . Alcohol use: No  . Drug use: No  . Sexual activity: Yes    Birth control/protection: Surgical  Other Topics Concern  . Not on file  Social History Narrative   5 kids, raising grandchild   Social Determinants of Radio broadcast assistant  Strain:   . Difficulty of Paying Living Expenses: Not on file  Food Insecurity:   . Worried About Charity fundraiser in the Last Year: Not on file  . Ran Out of Food in the Last Year: Not on file  Transportation Needs:   . Lack of Transportation (Medical): Not on file  . Lack of Transportation (Non-Medical): Not on file  Physical Activity:   . Days of Exercise per Week: Not on file  . Minutes of Exercise per Session: Not on file  Stress:   . Feeling of Stress : Not on file  Social Connections:   . Frequency of Communication with Friends and Family: Not on file  . Frequency of Social Gatherings with Friends and Family: Not on file  . Attends Religious Services: Not on file  . Active Member of Clubs or Organizations: Not on file  . Attends Archivist Meetings: Not on file  . Marital Status: Not on file   Outpatient Encounter Medications as of 12/20/2019  Medication Sig  . albuterol (VENTOLIN HFA) 108 (90 Base) MCG/ACT inhaler Inhale 1-2 puffs into the lungs every 6 (six) hours as needed for wheezing or shortness of  breath.   . baclofen (LIORESAL) 20 MG tablet Take 20 mg by mouth 3 (three) times daily.   . blood glucose meter kit and supplies KIT Dispense based on patient and insurance preference. Use up to four times daily as directed. (FOR ICD-9 250.00, 250.01).  . busPIRone (BUSPAR) 10 MG tablet Take 10 mg by mouth daily as needed. For anxiety  . citalopram (CELEXA) 40 MG tablet Take 40 mg by mouth every evening.   . clopidogrel (PLAVIX) 75 MG tablet Take 1 tablet (75 mg total) by mouth daily.  . Continuous Blood Gluc Sensor (DEXCOM G6 SENSOR) MISC 4 Pieces by Does not apply route once a week.  . Continuous Blood Gluc Transmit (DEXCOM G6 TRANSMITTER) MISC 1 Piece by Does not apply route as directed.  . Dulaglutide (TRULICITY) 1.5 HW/3.8UE SOPN Inject 1.5 mg into the skin once a week.  . gabapentin (NEURONTIN) 800 MG tablet Take 800 mg by mouth 4 (four) times daily.  Marland Kitchen glipiZIDE (GLUCOTROL XL) 10 MG 24 hr tablet Take 1 tablet (10 mg total) by mouth daily with breakfast.  . insulin regular human CONCENTRATED (HUMULIN R U-500 KWIKPEN) 500 UNIT/ML kwikpen Inject 60 Units into the skin 3 (three) times daily with meals.  Marland Kitchen lisinopril (PRINIVIL,ZESTRIL) 2.5 MG tablet Take 2.5 mg by mouth every evening.   . metFORMIN (GLUCOPHAGE) 500 MG tablet Take 2 tablets (1,000 mg total) by mouth 2 (two) times daily.  . metoprolol tartrate (LOPRESSOR) 25 MG tablet Take 1 tablet (25 mg total) by mouth 2 (two) times daily.  . ondansetron (ZOFRAN) 4 MG tablet Take 4 mg by mouth 4 (four) times daily as needed.  . Oxycodone HCl 10 MG TABS Take 10 mg by mouth every 4 (four) hours as needed (for pain).   . pravastatin (PRAVACHOL) 40 MG tablet Take 1 tablet (40 mg total) by mouth every evening.  . [DISCONTINUED] Continuous Blood Gluc Sensor (DEXCOM G6 SENSOR) MISC 4 Pieces by Does not apply route once a week.  . [DISCONTINUED] Continuous Blood Gluc Transmit (DEXCOM G6 TRANSMITTER) MISC 1 Piece by Does not apply route as directed.  .  [DISCONTINUED] glipiZIDE (GLUCOTROL XL) 10 MG 24 hr tablet Take 1 tablet (10 mg total) by mouth daily with breakfast.  . [DISCONTINUED] insulin regular human CONCENTRATED (HUMULIN  R U-500 KWIKPEN) 500 UNIT/ML kwikpen Inject 180 Units into the skin 3 (three) times daily with meals.  . [DISCONTINUED] metFORMIN (GLUCOPHAGE) 500 MG tablet Take 1,000 mg by mouth 2 (two) times daily.   No facility-administered encounter medications on file as of 12/20/2019.    ALLERGIES: Allergies  Allergen Reactions  . Peanut-Containing Drug Products Anaphylaxis and Hives    Patient states she is allergic to all nuts  . Shellfish Allergy Anaphylaxis  . Gluten Meal   . Latex Other (See Comments)    Reaction: blistering  . Prednisone Hives and Other (See Comments)    Reaction: causes psychological issues   . Nexium [Esomeprazole Magnesium] Hives  . Tape Rash    Paper tape please    VACCINATION STATUS: Immunization History  Administered Date(s) Administered  . Influenza Split 04/17/2012  . Pneumococcal Polysaccharide-23 04/17/2012    Diabetes She presents for her follow-up diabetic visit. She has type 2 diabetes mellitus. Onset time: She was diagnosed at approximate age of 53 years. Her disease course has been worsening. There are no hypoglycemic associated symptoms. Pertinent negatives for hypoglycemia include no confusion, pallor or seizures. Associated symptoms include fatigue, polydipsia and polyuria. Pertinent negatives for diabetes include no polyphagia. There are no hypoglycemic complications. Symptoms are worsening. There are no diabetic complications. Risk factors for coronary artery disease include diabetes mellitus, hypertension, obesity, sedentary lifestyle and dyslipidemia. Current diabetic treatment includes insulin injections. She is compliant with treatment most of the time. Her weight is fluctuating minimally. She is following a generally healthy (she admits to consumption of large quantities  of soda.) diet. When asked about meal planning, she reported none. She has not had a previous visit with a dietitian. She never participates in exercise. (She admittedly stopped caring for self for several weeks.  She ran out of her testing supplies, insulin, and wants to start over.  Her point-of-care A1c is 13.9%.   She brought in meter which has no readings the last 30 days.) An ACE inhibitor/angiotensin II receptor blocker is being taken.  Hyperlipidemia This is a chronic problem. The current episode started more than 1 year ago. The problem is uncontrolled. Exacerbating diseases include diabetes and obesity. Pertinent negatives include no myalgias. Current antihyperlipidemic treatment includes statins. Risk factors for coronary artery disease include dyslipidemia, diabetes mellitus, hypertension, obesity and a sedentary lifestyle.    Review of systems: Limited as above   Objective:    BP 104/70   Pulse 92   Ht 5' 5.5" (1.664 m)   Wt 187 lb 12.8 oz (85.2 kg)   BMI 30.78 kg/m   Wt Readings from Last 3 Encounters:  12/20/19 187 lb 12.8 oz (85.2 kg)  12/07/19 185 lb (83.9 kg)  05/05/19 195 lb (88.5 kg)     Diabetic Labs (most recent): Lab Results  Component Value Date   HGBA1C 14.7 (H) 03/12/2019   HGBA1C >14.0 (H) 11/01/2018   HGBA1C 13.9 (H) 07/05/2018     Lipid Panel ( most recent) Lipid Panel     Component Value Date/Time   CHOL 146 03/12/2019 0432   TRIG 105 03/12/2019 0432   HDL 44 03/12/2019 0432   CHOLHDL 3.3 03/12/2019 0432   VLDL 21 03/12/2019 0432   LDLCALC 81 03/12/2019 0432   LDLCALC 116 (H) 04/28/2017 1101     Lab Results  Component Value Date   TSH 0.31 (L) 04/28/2017   TSH 0.667 04/16/2012   FREET4 1.1 04/28/2017       Assessment &  Plan:   1. 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 She returns after missing her appointment since January 2021. She admittedly stopped caring for self for several weeks.  She ran  out of her testing supplies, insulin, and wants to start over.  Her point-of-care A1c is 13.9%.   She brought in meter which has no readings the last 30 days.  -her diabetes is complicated by CVA, obesity/ sedentary life and [name] remains at extremely high risk for more acute and chronic complications which include CAD, CVA, CKD, retinopathy, and neuropathy. These are all discussed in detail with the patient.  - I have counseled her on diet management and weight loss, by adopting a carbohydrate restricted/protein rich diet.  She admits to continued consumption of large quantities of soda, which explains the extremely high insulin resistance in her case.  - she  admits there is a room for improvement in her diet and drink choices. -  Suggestion is made for her to avoid simple carbohydrates  from her diet including Cakes, Sweet Desserts / Pastries, Ice Cream, Soda (diet and regular), Sweet Tea, Candies, Chips, Cookies, Sweet Pastries,  Store Bought Juices, Alcohol in Excess of  1-2 drinks a day, Artificial Sweeteners, Coffee Creamer, and "Sugar-free" Products. This will help patient to have stable blood glucose profile and potentially avoid unintended weight gain.  - I encouraged her to switch to  unprocessed or minimally processed complex starch and increased protein intake (animal or plant source), fruits, and vegetables.  - she is advised to stick to a routine mealtimes to eat 3 meals  a day and avoid unnecessary snacks ( to snack only to correct hypoglycemia).   - I have approached her with the following individualized plan to manage diabetes and patient agrees:     She is at extremely  high risk for hypoglycemia from inadvertent use of insulin, especially if she decides to decrease her carbs consumption.  -I had a long discussion about the need for reengagement for proper monitoring for safe use of insulin. -I discussed and prescribed all her supplies.  Due to the fact that she was not  taking her previously prescribed amount of insulin, I discussed and prescribed Humulin U500 at 60 units 3 times a day   20 minutes before her 3  major meals, associated with strict monitoring of blood glucose 4 times a day minimum.  I advised her to rotate her injection site.  She wore a Dexcom CGM device previously, will proceed to refill her supplies.    -Once again, she is encouraged to call clinic for blood glucose levels less than 70 or above 300 mg /dl. -She is advised to continue Metformin 1000 mg p.o. twice daily, therapeutically suitable for patient . -She is also advised to continue Trulicity 1.5 mg subcutaneously weekly.    -  Side effects and precautions discussed with her. -She is advised to continue glipizide  10 mg XL p.o. daily at breakfast.     2) Lipids/HPL: Her last lipid panel showed improving LDL of 116, improving from 141.  She is advised to continue atorvastatin 20 mg p.o. nightly.         Side effects and precautions discussed with her.    3) hypertension:  Her blood pressure is controlled to target. She is advised to continue lisinopril 2.5 mg p.o. daily.    4) weight management: Her BMI 30.7-a candidate for modest weight loss. Carbohydrate restrictions and exercise regimen discussed  in detail with her.  - I advised patient to maintain close follow up with Redmond School, MD for primary care needs.  - Time spent on this patient care encounter:  40 min, of which > 50% was spent in  counseling and the rest reviewing her blood glucose logs , discussing her hypoglycemia and hyperglycemia episodes, reviewing her current and  previous labs / studies  ( including abstraction from other facilities) and medications  doses and developing a  long term treatment plan and documenting her care.   Please refer to Patient Instructions for Blood Glucose Monitoring and Insulin/Medications Dosing Guide"  in media tab for additional information. Please  also refer to " Patient Self  Inventory" in the Media  tab for reviewed elements of pertinent patient history.  Georgianne Kirt Boys participated in the discussions, expressed understanding, and voiced agreement with the above plans.  All questions were answered to her satisfaction. she is encouraged to contact clinic should she have any questions or concerns prior to her return visit.   Follow up plan: - Return in about 1 week (around 12/27/2019) for Labs Today- Non-Fasting Ok.  Glade Lloyd, MD [facility] [address] [place], [place] [address] Phone: [phone]  Fax: [phone]    12/20/2019, 2:21 PM  This note was partially dictated with voice recognition software. Similar sounding words can be transcribed inadequately or may not  be corrected upon review.

## 2019-12-20 NOTE — Patient Instructions (Signed)

## 2019-12-21 LAB — COMPREHENSIVE METABOLIC PANEL
ALT: 42 IU/L — ABNORMAL HIGH (ref 0–32)
AST: 38 IU/L (ref 0–40)
Albumin/Globulin Ratio: 1.1 — ABNORMAL LOW (ref 1.2–2.2)
Albumin: 4 g/dL (ref 3.8–4.9)
Alkaline Phosphatase: 155 IU/L — ABNORMAL HIGH (ref 44–121)
BUN/Creatinine Ratio: 14 (ref 9–23)
BUN: 11 mg/dL (ref 6–24)
Bilirubin Total: 0.4 mg/dL (ref 0.0–1.2)
CO2: 23 mmol/L (ref 20–29)
Calcium: 8.9 mg/dL (ref 8.7–10.2)
Chloride: 98 mmol/L (ref 96–106)
Creatinine, Ser: 0.77 mg/dL (ref 0.57–1.00)
GFR calc Af Amer: 101 mL/min/{1.73_m2} (ref >59)
GFR calc non Af Amer: 88 mL/min/{1.73_m2} (ref >59)
Globulin, Total: 3.8 g/dL (ref 1.5–4.5)
Glucose: 361 mg/dL — ABNORMAL HIGH (ref 65–99)
Potassium: 4.4 mmol/L (ref 3.5–5.2)
Sodium: 136 mmol/L (ref 134–144)
Total Protein: 7.8 g/dL (ref 6.0–8.5)

## 2019-12-21 LAB — T4, FREE: Free T4: 1.34 ng/dL (ref 0.82–1.77)

## 2019-12-21 LAB — TSH: TSH: 0.267 u[IU]/mL — ABNORMAL LOW (ref 0.450–4.500)

## 2019-12-27 ENCOUNTER — Encounter: Payer: Self-pay | Admitting: "Endocrinology

## 2019-12-27 ENCOUNTER — Other Ambulatory Visit: Payer: Self-pay

## 2019-12-27 ENCOUNTER — Ambulatory Visit (INDEPENDENT_AMBULATORY_CARE_PROVIDER_SITE_OTHER): Payer: BC Managed Care – PPO | Admitting: "Endocrinology

## 2019-12-27 VITALS — BP 96/64 | HR 84 | Ht 65.5 in | Wt 193.4 lb

## 2019-12-27 DIAGNOSIS — E0590 Thyrotoxicosis, unspecified without thyrotoxic crisis or storm: Secondary | ICD-10-CM | POA: Diagnosis not present

## 2019-12-27 DIAGNOSIS — I10 Essential (primary) hypertension: Secondary | ICD-10-CM | POA: Diagnosis not present

## 2019-12-27 DIAGNOSIS — E1159 Type 2 diabetes mellitus with other circulatory complications: Secondary | ICD-10-CM

## 2019-12-27 DIAGNOSIS — E782 Mixed hyperlipidemia: Secondary | ICD-10-CM | POA: Diagnosis not present

## 2019-12-27 DIAGNOSIS — S92514A Nondisplaced fracture of proximal phalanx of right lesser toe(s), initial encounter for closed fracture: Secondary | ICD-10-CM | POA: Insufficient documentation

## 2019-12-27 MED ORDER — HUMULIN R U-500 KWIKPEN 500 UNIT/ML ~~LOC~~ SOPN
70.0000 [IU] | PEN_INJECTOR | Freq: Three times a day (TID) | SUBCUTANEOUS | 2 refills | Status: DC
Start: 2019-12-27 — End: 2020-01-03

## 2019-12-27 NOTE — Patient Instructions (Signed)

## 2019-12-27 NOTE — Progress Notes (Signed)
12/27/2019, 1:13 PM    Endocrinology follow-up note    Subjective:    Patient ID: [name], female    DOB: 1966-03-19.  [name] is being seen in follow-up for management of currently uncontrolled symptomatic 2 diabetes, hyperlipidemia, hypertension. PMD:   [employer], MD.   Past Medical History:  Diagnosis Date  . Asthma   . Chronic abdominal pain   . Chronic neck pain    C4-6 fusion  . CONSTIPATION 05/16/2009   Qualifier: Diagnosis of  By: Ronnald Ramp FNP-BC, Kandice L   . Diabetes mellitus without complication ([place])   . Dysphagia 02/23/2012  . Fatty liver   . GERD (gastroesophageal reflux disease)   . Helicobacter pylori gastritis 2011  . Hyperlipidemia   . Hypertension   . LIVER FUNCTION TESTS, ABNORMAL, HX OF 05/14/2009   Qualifier: Diagnosis of  By: Stephan Minister    . Migraine headache   . MIGRAINE, COMMON 05/14/2009   Qualifier: Diagnosis of  By: Stephan Minister    . MS (multiple sclerosis) ([place])   . Sleep apnea    Past Surgical History:  Procedure Laterality Date  . ABDOMINAL HYSTERECTOMY    . APPENDECTOMY    . BACK SURGERY  03/07/2012   Nerve Stimulator  . CHOLECYSTECTOMY    . COLONOSCOPY  07/09/2009   RMR; normal rectum aside from anal canal hemorrhoids/scattered left-sided diverticula  . ESOPHAGOGASTRODUODENOSCOPY  05/22/2009   RMR; normal /small HH  . ESOPHAGOGASTRODUODENOSCOPY  2007   RMR: non-critical Schatzki's rin, non-maniuplated  . ESOPHAGOGASTRODUODENOSCOPY N/A 10/24/2013   Procedure: ESOPHAGOGASTRODUODENOSCOPY (EGD);  Surgeon: Daneil Dolin, MD;  Location: [facility];  Service: Endoscopy;  Laterality: N/A;  9:45  . ESOPHAGOGASTRODUODENOSCOPY (EGD) WITH ESOPHAGEAL DILATION  03/17/2012   UYQ:[id number] appearing esophageal mucosa of uncertain significance. Status post Venia Minks dilation followed by esophageal bx  .  MALONEY DILATION N/A 10/24/2013   Procedure: Venia Minks DILATION;  Surgeon: Daneil Dolin, MD;  Location: [facility];  Service: Endoscopy;  Laterality: N/A;  . MANDIBLE SURGERY    . NECK SURGERY     X2, last time 2013   Social History   Socioeconomic History  . Marital status: Married    Spouse name: Not on file  . Number of children: Not on file  . Years of education: Not on file  . Highest education level: Not on file  Occupational History  . Not on file  Tobacco Use  . Smoking status: Never Smoker  . Smokeless tobacco: Never Used  Vaping Use  . Vaping Use: Never used  Substance and Sexual Activity  . Alcohol use: No  . Drug use: No  . Sexual activity: Yes    Birth control/protection: Surgical  Other Topics Concern  . Not on file  Social History Narrative   5 kids, raising grandchild   Social Determinants of Radio broadcast assistant  Strain:   . Difficulty of Paying Living Expenses: Not on file  Food Insecurity:   . Worried About Charity fundraiser in the Last Year: Not on file  . Ran Out of Food in the Last Year: Not on file  Transportation Needs:   . Lack of Transportation (Medical): Not on file  . Lack of Transportation (Non-Medical): Not on file  Physical Activity:   . Days of Exercise per Week: Not on file  . Minutes of Exercise per Session: Not on file  Stress:   . Feeling of Stress : Not on file  Social Connections:   . Frequency of Communication with Friends and Family: Not on file  . Frequency of Social Gatherings with Friends and Family: Not on file  . Attends Religious Services: Not on file  . Active Member of Clubs or Organizations: Not on file  . Attends Archivist Meetings: Not on file  . Marital Status: Not on file   Outpatient Encounter Medications as of 12/27/2019  Medication Sig  . albuterol (VENTOLIN HFA) 108 (90 Base) MCG/ACT inhaler Inhale 1-2 puffs into the lungs every 6 (six) hours as needed for wheezing or shortness of  breath.   . baclofen (LIORESAL) 20 MG tablet Take 20 mg by mouth 3 (three) times daily.   . blood glucose meter kit and supplies KIT Dispense based on patient and insurance preference. Use up to four times daily as directed. (FOR ICD-9 250.00, 250.01).  . busPIRone (BUSPAR) 10 MG tablet Take 10 mg by mouth daily as needed. For anxiety  . citalopram (CELEXA) 40 MG tablet Take 40 mg by mouth every evening.   . clopidogrel (PLAVIX) 75 MG tablet Take 1 tablet (75 mg total) by mouth daily.  . Continuous Blood Gluc Sensor (DEXCOM G6 SENSOR) MISC 4 Pieces by Does not apply route once a week.  . Continuous Blood Gluc Transmit (DEXCOM G6 TRANSMITTER) MISC 1 Piece by Does not apply route as directed.  . Dulaglutide (TRULICITY) 1.5 XT/0.2IO SOPN Inject 1.5 mg into the skin once a week.  . gabapentin (NEURONTIN) 800 MG tablet Take 800 mg by mouth 4 (four) times daily.  Marland Kitchen glipiZIDE (GLUCOTROL XL) 10 MG 24 hr tablet Take 1 tablet (10 mg total) by mouth daily with breakfast.  . insulin regular human CONCENTRATED (HUMULIN R U-500 KWIKPEN) 500 UNIT/ML kwikpen Inject 70 Units into the skin 3 (three) times daily with meals.  Marland Kitchen lisinopril (PRINIVIL,ZESTRIL) 2.5 MG tablet Take 2.5 mg by mouth every evening.   . metFORMIN (GLUCOPHAGE) 500 MG tablet Take 2 tablets (1,000 mg total) by mouth 2 (two) times daily.  . metoprolol tartrate (LOPRESSOR) 25 MG tablet Take 1 tablet (25 mg total) by mouth 2 (two) times daily.  . ondansetron (ZOFRAN) 4 MG tablet Take 4 mg by mouth 4 (four) times daily as needed.  . Oxycodone HCl 10 MG TABS Take 10 mg by mouth every 4 (four) hours as needed (for pain).   . pravastatin (PRAVACHOL) 40 MG tablet Take 1 tablet (40 mg total) by mouth every evening.  . [DISCONTINUED] insulin regular human CONCENTRATED (HUMULIN R U-500 KWIKPEN) 500 UNIT/ML kwikpen Inject 60 Units into the skin 3 (three) times daily with meals.   No facility-administered encounter medications on file as of 12/27/2019.     ALLERGIES: Allergies  Allergen Reactions  . Peanut-Containing Drug Products Anaphylaxis and Hives    Patient states she is allergic to all nuts  . Shellfish Allergy Anaphylaxis  .  Gluten Meal   . Latex Other (See Comments)    Reaction: blistering  . Prednisone Hives and Other (See Comments)    Reaction: causes psychological issues   . Nexium [Esomeprazole Magnesium] Hives  . Tape Rash    Paper tape please    VACCINATION STATUS: Immunization History  Administered Date(s) Administered  . Influenza Split 04/17/2012  . Pneumococcal Polysaccharide-23 04/17/2012    Diabetes She presents for her follow-up diabetic visit. She has type 2 diabetes mellitus. Onset time: She was diagnosed at approximate age of 69 years. Her disease course has been worsening. There are no hypoglycemic associated symptoms. Pertinent negatives for hypoglycemia include no confusion, pallor or seizures. Associated symptoms include fatigue, polydipsia and polyuria. Pertinent negatives for diabetes include no polyphagia. There are no hypoglycemic complications. Symptoms are improving. There are no diabetic complications. Risk factors for coronary artery disease include diabetes mellitus, hypertension, obesity, sedentary lifestyle, dyslipidemia, post-menopausal and family history. Current diabetic treatment includes insulin injections. She is compliant with treatment most of the time. Her weight is increasing steadily. She is following a generally healthy (she admits to consumption of large quantities of soda.) diet. When asked about meal planning, she reported none. She has not had a previous visit with a dietitian. She never participates in exercise. Her home blood glucose trend is decreasing steadily. Her breakfast blood glucose range is generally >200 mg/dl. Her lunch blood glucose range is generally >200 mg/dl. Her dinner blood glucose range is generally >200 mg/dl. Her bedtime blood glucose range is generally 180-200  mg/dl. Her overall blood glucose range is >200 mg/dl. (She presents with improving, however still significantly above target glycemic profile.  She has started taking her U500 50 units 3 times daily AC.  Her recent point-of-care A1c was 13.9%.   Her CGM analysis shows 90% above target readings, 10% time in range.) An ACE inhibitor/angiotensin II receptor blocker is being taken.  Hyperlipidemia This is a chronic problem. The current episode started more than 1 year ago. The problem is uncontrolled. Exacerbating diseases include diabetes and obesity. Pertinent negatives include no myalgias. Current antihyperlipidemic treatment includes statins. Risk factors for coronary artery disease include dyslipidemia, diabetes mellitus, hypertension, obesity and a sedentary lifestyle.    Review of systems: Limited as above   Objective:    BP 96/64   Pulse 84   Ht 5' 5.5" (1.664 m)   Wt 193 lb 6.4 oz (87.7 kg)   BMI 31.69 kg/m   Wt Readings from Last 3 Encounters:  12/27/19 193 lb 6.4 oz (87.7 kg)  12/20/19 187 lb 12.8 oz (85.2 kg)  12/07/19 185 lb (83.9 kg)     Diabetic Labs (most recent): Lab Results  Component Value Date   HGBA1C 13.9 12/20/2019   HGBA1C 14.7 (H) 03/12/2019   HGBA1C >14.0 (H) 11/01/2018     Lipid Panel ( most recent) Lipid Panel     Component Value Date/Time   CHOL 146 03/12/2019 0432   TRIG 105 03/12/2019 0432   HDL 44 03/12/2019 0432   CHOLHDL 3.3 03/12/2019 0432   VLDL 21 03/12/2019 0432   LDLCALC 81 03/12/2019 0432   LDLCALC 116 (H) 04/28/2017 1101     Lab Results  Component Value Date   TSH 0.267 (L) 12/20/2019   TSH 0.31 (L) 04/28/2017   TSH 0.667 04/16/2012   FREET4 1.34 12/20/2019   FREET4 1.1 04/28/2017       Assessment & Plan:   1. Uncontrolled type 2 diabetes mellitus with hyperglycemia  complicated by CVA. She presents with improving, however still significantly above target glycemic profile.  She has started taking her U500 50 units 3 times  daily AC.  Her recent point-of-care A1c was 13.9%.   Her CGM analysis shows 90% above target readings, 10% time in range. -her diabetes is complicated by CVA, obesity/ sedentary life and [name] remains at extremely high risk for more acute and chronic complications which include CAD, CVA, CKD, retinopathy, and neuropathy. These are all discussed in detail with the patient.  - I have counseled her on diet management and weight loss, by adopting a carbohydrate restricted/protein rich diet.  She admits to continued consumption of large quantities of soda, which explains the extremely high insulin resistance in her case.  - she  admits there is a room for improvement in her diet and drink choices. -  Suggestion is made for her to avoid simple carbohydrates  from her diet including Cakes, Sweet Desserts / Pastries, Ice Cream, Soda (diet and regular), Sweet Tea, Candies, Chips, Cookies, Sweet Pastries,  Store Bought Juices, Alcohol in Excess of  1-2 drinks a day, Artificial Sweeteners, Coffee Creamer, and "Sugar-free" Products. This will help patient to have stable blood glucose profile and potentially avoid unintended weight gain.   - I encouraged her to switch to  unprocessed or minimally processed complex starch and increased protein intake (animal or plant source), fruits, and vegetables.  - she is advised to stick to a routine mealtimes to eat 3 meals  a day and avoid unnecessary snacks ( to snack only to correct hypoglycemia).   - I have approached her with the following individualized plan to manage diabetes and patient agrees:     She is at extremely  high risk for hypoglycemia from inadvertent use of insulin, especially if she decides to decrease her carbs consumption.  -I had a long discussion about the need for stay engaged for proper monitoring and safe use of insulin.  She has now her Dexcom CGM device back.   She is advised to use this tool all the time.  -She is advised to  increase her Humulin U500 70  units 3 times a day   20 minutes before her 3  major meals, associated with strict monitoring of blood glucose 4 times a day minimum.  I advised her to rotate her injection site.    -Once again, she is encouraged to call clinic for blood glucose levels less than 70 or above 300 mg /dl. -She is advised to continue Metformin 1000 mg p.o. twice daily,  therapeutically suitable for patient . -She is also advised to continue Trulicity 1.5 mg subcutaneously weekly.    -  Side effects and precautions discussed with her. -She is advised to continue glipizide  10 mg XL p.o. daily at breakfast.     2) Lipids/HPL: Her last lipid panel showed improving LDL of 116, improving from 141.  She is advised to continue atorvastatin 20 mg p.o. nightly.         Side effects and precautions discussed with her.    3) hypertension:  Her blood pressure is controlled to target. She is advised to continue lisinopril 2.5 mg p.o. daily.    4) weight management: Her BMI 31.6 -a candidate for modest weight loss. Carbohydrate restrictions and exercise regimen discussed in detail with her.  - I advised patient to maintain close follow up with Redmond School, MD for primary care needs.  - Time spent on this  patient care encounter:  35 min, of which > 50% was spent in  counseling and the rest reviewing her blood glucose logs , discussing her hypoglycemia and hyperglycemia episodes, reviewing her current and  previous labs / studies  ( including abstraction from other facilities) and medications  doses and developing a  long term treatment plan and documenting her care.   Please refer to Patient Instructions for Blood Glucose Monitoring and Insulin/Medications Dosing Guide"  in media tab for additional information. Please  also refer to " Patient Self Inventory" in the Media  tab for reviewed elements of pertinent patient history.  Deann Kirt Boys participated in the discussions, expressed  understanding, and voiced agreement with the above plans.  All questions were answered to her satisfaction. she is encouraged to contact clinic should she have any questions or concerns prior to her return visit.   Follow up plan: - Return in about 3 months (around 03/27/2020) for F/U with Pre-visit Labs, Meter, Logs, A1c here.Glade Lloyd, MD [facility] [address] [place], [place] [address] Phone: [phone]  Fax: [phone]    12/27/2019, 1:13 PM  This note was partially dictated with voice recognition software. Similar sounding words can be transcribed inadequately or may not  be corrected upon review.

## 2019-12-28 ENCOUNTER — Encounter: Payer: Self-pay | Admitting: Orthopedic Surgery

## 2019-12-28 ENCOUNTER — Ambulatory Visit: Payer: BC Managed Care – PPO

## 2019-12-28 ENCOUNTER — Ambulatory Visit (INDEPENDENT_AMBULATORY_CARE_PROVIDER_SITE_OTHER): Payer: BC Managed Care – PPO | Admitting: Orthopedic Surgery

## 2019-12-28 DIAGNOSIS — S92514D Nondisplaced fracture of proximal phalanx of right lesser toe(s), subsequent encounter for fracture with routine healing: Secondary | ICD-10-CM | POA: Diagnosis not present

## 2019-12-28 NOTE — Patient Instructions (Signed)
Regular shoes 

## 2019-12-28 NOTE — Progress Notes (Signed)
GLOBAL PERIOD POST OP APPT    INJURY OR POST OP DAY: aug 20 , 41  There were no vitals taken for this visit.    Encounter Diagnosis  Name Primary?  . Closed nondisplaced fracture of proximal phalanx of lesser toe of right foot with routine healing, subsequent encounter 11/17/19 Yes    Chief Complaint  Patient presents with  . Toe Injury    11/17/19 toe right   Physical Exam  The rt 5th toe is normally aligned   xr fracture healed  Plan: released

## 2020-01-03 ENCOUNTER — Other Ambulatory Visit: Payer: Self-pay | Admitting: "Endocrinology

## 2020-01-14 ENCOUNTER — Encounter (HOSPITAL_COMMUNITY): Payer: Self-pay

## 2020-01-14 ENCOUNTER — Emergency Department (HOSPITAL_COMMUNITY): Payer: BC Managed Care – PPO

## 2020-01-14 ENCOUNTER — Other Ambulatory Visit: Payer: Self-pay

## 2020-01-14 ENCOUNTER — Emergency Department (HOSPITAL_COMMUNITY)
Admission: EM | Admit: 2020-01-14 | Discharge: 2020-01-14 | Disposition: A | Discharge status: Home / Self Care | Payer: BC Managed Care – PPO | Attending: Emergency Medicine | Admitting: Emergency Medicine

## 2020-01-14 DIAGNOSIS — G35 Multiple sclerosis: Secondary | ICD-10-CM | POA: Insufficient documentation

## 2020-01-14 DIAGNOSIS — Z79899 Other long term (current) drug therapy: Secondary | ICD-10-CM | POA: Diagnosis not present

## 2020-01-14 DIAGNOSIS — Z7901 Long term (current) use of anticoagulants: Secondary | ICD-10-CM | POA: Insufficient documentation

## 2020-01-14 DIAGNOSIS — S0592XA Unspecified injury of left eye and orbit, initial encounter: Secondary | ICD-10-CM | POA: Diagnosis present

## 2020-01-14 DIAGNOSIS — E119 Type 2 diabetes mellitus without complications: Secondary | ICD-10-CM | POA: Insufficient documentation

## 2020-01-14 DIAGNOSIS — S0512XA Contusion of eyeball and orbital tissues, left eye, initial encounter: Secondary | ICD-10-CM | POA: Insufficient documentation

## 2020-01-14 DIAGNOSIS — Z91040 Latex allergy status: Secondary | ICD-10-CM | POA: Diagnosis not present

## 2020-01-14 DIAGNOSIS — Z794 Long term (current) use of insulin: Secondary | ICD-10-CM | POA: Diagnosis not present

## 2020-01-14 DIAGNOSIS — R519 Headache, unspecified: Secondary | ICD-10-CM | POA: Insufficient documentation

## 2020-01-14 DIAGNOSIS — I10 Essential (primary) hypertension: Secondary | ICD-10-CM | POA: Insufficient documentation

## 2020-01-14 MED ORDER — KETOROLAC TROMETHAMINE 60 MG/2ML IM SOLN
60.0000 mg | Freq: Once | INTRAMUSCULAR | Status: AC
  Administered 2020-01-14: 60 mg via INTRAMUSCULAR
  Filled 2020-01-14: qty 2
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
## 2020-01-14 NOTE — Discharge Instructions (Signed)
Your scans today were overall reassuring.  Please take Tylenol for pain.  You may apply ice to the affected areas.  Please make sure to follow-up with your primary care doctor.  Return to the ER for any new or worsening symptoms.

## 2020-01-14 NOTE — ED Notes (Signed)
Pt sitting up in bed, ice packs given, pt has swelling and contusion to L forearm, tender to touch, can move all digits, explained that if she started to notice any swelling going down her arm she should remove the ring on her L hand.  Pt verbalized understanding.  Pt has swelling and contusion to L orbit, pt pupils are 6mm and reactive to light, L orbit is swollen shut. Pt oriented, states that she is on blood thinners.  resps even and unlabored.
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
## 2020-01-29 ENCOUNTER — Ambulatory Visit (INDEPENDENT_AMBULATORY_CARE_PROVIDER_SITE_OTHER): Payer: BC Managed Care – PPO | Admitting: Physician Assistant

## 2020-01-29 ENCOUNTER — Encounter: Payer: Self-pay | Admitting: Physician Assistant

## 2020-01-29 ENCOUNTER — Other Ambulatory Visit: Payer: Self-pay

## 2020-01-29 VITALS — BP 120/82 | HR 101 | Ht 65.0 in | Wt 190.0 lb

## 2020-01-29 DIAGNOSIS — Z87898 Personal history of other specified conditions: Secondary | ICD-10-CM | POA: Diagnosis not present

## 2020-01-29 DIAGNOSIS — E785 Hyperlipidemia, unspecified: Secondary | ICD-10-CM | POA: Diagnosis not present

## 2020-01-29 DIAGNOSIS — I10 Essential (primary) hypertension: Secondary | ICD-10-CM | POA: Diagnosis not present

## 2020-01-29 DIAGNOSIS — E1165 Type 2 diabetes mellitus with hyperglycemia: Secondary | ICD-10-CM

## 2020-01-29 DIAGNOSIS — R002 Palpitations: Secondary | ICD-10-CM | POA: Diagnosis not present

## 2020-01-29 DIAGNOSIS — Z8673 Personal history of transient ischemic attack (TIA), and cerebral infarction without residual deficits: Secondary | ICD-10-CM

## 2020-01-29 MED ORDER — METOPROLOL SUCCINATE ER 50 MG PO TB24: 50.0000 mg | ORAL_TABLET | Freq: Every day | ORAL | 3 refills | Status: DC

## 2020-01-29 NOTE — Patient Instructions (Signed)
Medication Instructions:  Your physician has recommended you make the following change in your medication:   Stop taking Lopressor  Start Taking Toprol XL 50 mg Daily   *If you need a refill on your cardiac medications before your next appointment, please call your pharmacy*   Lab Work: NONE   If you have labs (blood work) drawn today and your tests are completely normal, you will receive your results only by: Marland Kitchen MyChart Message (if you have MyChart) OR . A paper copy in the mail If you have any lab test that is abnormal or we need to change your treatment, we will call you to review the results.   Testing/Procedures: NONE   Follow-Up: At [facility], you and your health needs are our priority.  As part of our continuing mission to provide you with exceptional heart care, we have created designated Provider Care Teams.  These Care Teams include your primary Cardiologist (physician) and Advanced Practice Providers (APPs -  Physician Assistants and Nurse Practitioners) who all work together to provide you with the care you need, when you need it.  We recommend signing up for the patient portal called "MyChart".  Sign up information is provided on this After Visit Summary.  MyChart is used to connect with patients for Virtual Visits (Telemedicine).  Patients are able to view lab/test results, encounter notes, upcoming appointments, etc.  Non-urgent messages can be sent to your provider as well.   To learn more about what you can do with MyChart, go to [email or web address].    Your next appointment:   1 year(s)  The format for your next appointment:   In Person  Provider:   Dina Rich, MD   Other Instructions Thank you for choosing [facility]!

## 2020-02-12 ENCOUNTER — Other Ambulatory Visit: Payer: Self-pay | Admitting: "Endocrinology

## 2020-02-16 ENCOUNTER — Inpatient Hospital Stay (HOSPITAL_COMMUNITY): Admission: RE | Admit: 2020-02-16 | Payer: BC Managed Care – PPO | Source: Ambulatory Visit

## 2020-03-06 ENCOUNTER — Other Ambulatory Visit: Payer: Self-pay | Admitting: Internal Medicine

## 2020-03-06 ENCOUNTER — Other Ambulatory Visit (HOSPITAL_COMMUNITY): Payer: Self-pay | Admitting: Internal Medicine

## 2020-03-06 DIAGNOSIS — R1310 Dysphagia, unspecified: Secondary | ICD-10-CM

## 2020-03-13 ENCOUNTER — Other Ambulatory Visit (HOSPITAL_COMMUNITY): Payer: Self-pay | Admitting: Internal Medicine

## 2020-03-13 ENCOUNTER — Other Ambulatory Visit: Payer: Self-pay

## 2020-03-13 ENCOUNTER — Ambulatory Visit (HOSPITAL_COMMUNITY)
Admission: RE | Admit: 2020-03-13 | Discharge: 2020-03-13 | Disposition: A | Discharge status: Home / Self Care | Payer: BC Managed Care – PPO | Source: Ambulatory Visit | Attending: Internal Medicine | Admitting: Internal Medicine

## 2020-03-13 DIAGNOSIS — R1310 Dysphagia, unspecified: Secondary | ICD-10-CM

## 2020-03-21 LAB — THYROID PEROXIDASE ANTIBODY: Thyroperoxidase Ab SerPl-aCnc: <8 IU/mL (ref 0–34)

## 2020-03-21 LAB — T3, FREE: T3, Free: 3.1 pg/mL (ref 2.0–4.4)

## 2020-03-21 LAB — TSH: TSH: 0.237 u[IU]/mL — ABNORMAL LOW (ref 0.450–4.500)

## 2020-03-21 LAB — T4, FREE: Free T4: 1.04 ng/dL (ref 0.82–1.77)

## 2020-03-21 LAB — THYROGLOBULIN ANTIBODY: Thyroglobulin Antibody: <1 IU/mL (ref 0.0–0.9)

## 2020-03-27 ENCOUNTER — Other Ambulatory Visit: Payer: Self-pay

## 2020-03-27 ENCOUNTER — Ambulatory Visit (INDEPENDENT_AMBULATORY_CARE_PROVIDER_SITE_OTHER): Payer: BC Managed Care – PPO | Admitting: "Endocrinology

## 2020-03-27 ENCOUNTER — Encounter: Payer: Self-pay | Admitting: "Endocrinology

## 2020-03-27 VITALS — BP 128/78 | HR 88 | Ht 65.0 in | Wt 195.0 lb

## 2020-03-27 DIAGNOSIS — E0590 Thyrotoxicosis, unspecified without thyrotoxic crisis or storm: Secondary | ICD-10-CM | POA: Diagnosis not present

## 2020-03-27 DIAGNOSIS — E1159 Type 2 diabetes mellitus with other circulatory complications: Secondary | ICD-10-CM

## 2020-03-27 DIAGNOSIS — E782 Mixed hyperlipidemia: Secondary | ICD-10-CM

## 2020-03-27 DIAGNOSIS — I10 Essential (primary) hypertension: Secondary | ICD-10-CM | POA: Diagnosis not present

## 2020-03-27 LAB — POCT GLYCOSYLATED HEMOGLOBIN (HGB A1C): HbA1c, POC (controlled diabetic range): 7.9 % — AB (ref 0.0–7.0)

## 2020-03-27 MED ORDER — HUMULIN R U-500 KWIKPEN 500 UNIT/ML ~~LOC~~ SOPN
70.0000 [IU] | PEN_INJECTOR | Freq: Three times a day (TID) | SUBCUTANEOUS | 2 refills | Status: DC
Start: 2020-03-27 — End: 2020-06-26

## 2020-03-27 NOTE — Progress Notes (Addendum)
03/27/2020, 12:20 PM    Endocrinology follow-up note    Subjective:    Patient ID: [name], female    DOB: [birth year].  [name] is being seen in follow-up for management of currently uncontrolled symptomatic 2 diabetes, hyperlipidemia, hypertension. PMD:   [employer], MD.   Past Medical History:  Diagnosis Date  . Asthma   . Chronic abdominal pain   . Chronic neck pain    C4-6 fusion  . CONSTIPATION 05/16/2009   Qualifier: Diagnosis of  By: Ronnald Ramp FNP-BC, Kandice L   . Diabetes mellitus without complication ([place])   . Dysphagia 02/23/2012  . Fatty liver   . GERD (gastroesophageal reflux disease)   . Helicobacter pylori gastritis 2011  . Hyperlipidemia   . Hypertension   . LIVER FUNCTION TESTS, ABNORMAL, HX OF 05/14/2009   Qualifier: Diagnosis of  By: Stephan Minister    . Migraine headache   . MIGRAINE, COMMON 05/14/2009   Qualifier: Diagnosis of  By: Stephan Minister    . MS (multiple sclerosis) ([place])   . Sleep apnea    Past Surgical History:  Procedure Laterality Date  . ABDOMINAL HYSTERECTOMY    . APPENDECTOMY    . BACK SURGERY  03/07/2012   Nerve Stimulator  . CHOLECYSTECTOMY    . COLONOSCOPY  07/09/2009   RMR; normal rectum aside from anal canal hemorrhoids/scattered left-sided diverticula  . ESOPHAGOGASTRODUODENOSCOPY  05/22/2009   RMR; normal /small HH  . ESOPHAGOGASTRODUODENOSCOPY  2007   RMR: non-critical Schatzki's rin, non-maniuplated  . ESOPHAGOGASTRODUODENOSCOPY N/A 10/24/2013   Procedure: ESOPHAGOGASTRODUODENOSCOPY (EGD);  Surgeon: Daneil Dolin, MD;  Location: [facility];  Service: Endoscopy;  Laterality: N/A;  9:45  . ESOPHAGOGASTRODUODENOSCOPY (EGD) WITH ESOPHAGEAL DILATION  03/17/2012   ZYY:QMGNOIBB appearing esophageal mucosa of uncertain significance. Status post Venia Minks dilation followed by esophageal bx  .  MALONEY DILATION N/A 10/24/2013   Procedure: Venia Minks DILATION;  Surgeon: Daneil Dolin, MD;  Location: [facility];  Service: Endoscopy;  Laterality: N/A;  . MANDIBLE SURGERY    . NECK SURGERY     X2, last time 2013   Social History   Socioeconomic History  . Marital status: Married    Spouse name: Not on file  . Number of children: Not on file  . Years of education: Not on file  . Highest education level: Not on file  Occupational History  . Not on file  Tobacco Use  . Smoking status: Never Smoker  . Smokeless tobacco: Never Used  Vaping Use  . Vaping Use: Never used  Substance and Sexual Activity  . Alcohol use: No  . Drug use: No  . Sexual activity: Yes    Birth control/protection: Surgical  Other Topics Concern  . Not on file  Social History Narrative   5 kids, raising grandchild   Social Determinants of Radio broadcast assistant  Strain: Not on file  Food Insecurity: Not on file  Transportation Needs: Not on file  Physical Activity: Not on file  Stress: Not on file  Social Connections: Not on file   Outpatient Encounter Medications as of 03/27/2020  Medication Sig  . albuterol (VENTOLIN HFA) 108 (90 Base) MCG/ACT inhaler Inhale 1-2 puffs into the lungs every 6 (six) hours as needed for wheezing or shortness of breath.   . baclofen (LIORESAL) 20 MG tablet Take 20 mg by mouth 3 (three) times daily.   . blood glucose meter kit and supplies KIT Dispense based on patient and insurance preference. Use up to four times daily as directed. (FOR ICD-9 250.00, 250.01).  . busPIRone (BUSPAR) 10 MG tablet Take 10 mg by mouth daily as needed. For anxiety  . citalopram (CELEXA) 40 MG tablet Take 40 mg by mouth every evening.   . Continuous Blood Gluc Sensor (DEXCOM G6 SENSOR) MISC 4 Pieces by Does not apply route once a week.  . Continuous Blood Gluc Transmit (DEXCOM G6 TRANSMITTER) MISC 1 Piece by Does not apply route as directed.  . gabapentin (NEURONTIN) 800 MG tablet  Take 800 mg by mouth 4 (four) times daily.  Marland Kitchen glipiZIDE (GLUCOTROL XL) 10 MG 24 hr tablet Take 1 tablet (10 mg total) by mouth daily with breakfast.  . Insulin Pen Needle (B-D ULTRAFINE III SHORT PEN) 31G X 8 MM MISC USE AS DIRECTED TID  . insulin regular human CONCENTRATED (HUMULIN R U-500 KWIKPEN) 500 UNIT/ML kwikpen Inject 70 Units into the skin 3 (three) times daily with meals.  Marland Kitchen lisinopril (PRINIVIL,ZESTRIL) 2.5 MG tablet Take 2.5 mg by mouth every evening.   . metFORMIN (GLUCOPHAGE) 500 MG tablet Take 2 tablets (1,000 mg total) by mouth 2 (two) times daily.  . metoprolol succinate (TOPROL-XL) 50 MG 24 hr tablet Take 1 tablet (50 mg total) by mouth daily. Take with or immediately following a meal.  . ondansetron (ZOFRAN) 4 MG tablet Take 4 mg by mouth 4 (four) times daily as needed.  . Oxycodone HCl 10 MG TABS Take 10 mg by mouth every 4 (four) hours as needed (for pain).   . pravastatin (PRAVACHOL) 40 MG tablet Take 1 tablet (40 mg total) by mouth every evening.  . TRULICITY 1.5 ZO/1.0RU SOPN INJECT 1 SYRINGE INTO THE SKIN ONCE WEEKLY.  . [DISCONTINUED] insulin regular human CONCENTRATED (HUMULIN R U-500 KWIKPEN) 500 UNIT/ML kwikpen Inject 50 Units into the skin 3 (three) times daily with meals. (Patient taking differently: Inject 70 Units into the skin 3 (three) times daily with meals. )   No facility-administered encounter medications on file as of 03/27/2020.    ALLERGIES: Allergies  Allergen Reactions  . Peanut-Containing Drug Products Anaphylaxis and Hives    Patient states she is allergic to all nuts  . Shellfish Allergy Anaphylaxis  . Gluten Meal   . Latex Other (See Comments)    Reaction: blistering  . Prednisone Hives and Other (See Comments)    Reaction: causes psychological issues   . Nexium [Esomeprazole Magnesium] Hives  . Tape Rash    Paper tape please    VACCINATION STATUS: Immunization History  Administered Date(s) Administered  . Influenza Split 04/17/2012   . Pneumococcal Polysaccharide-23 04/17/2012    Diabetes She presents for her follow-up diabetic visit. She has type 2 diabetes mellitus. Onset time: She was diagnosed at approximate age of 68 years. Her disease course has been improving. There are no hypoglycemic associated symptoms. Pertinent negatives for hypoglycemia  include no confusion, pallor or seizures. Associated symptoms include fatigue. Pertinent negatives for diabetes include no polydipsia, no polyphagia and no polyuria. There are no hypoglycemic complications. Symptoms are improving. There are no diabetic complications. Risk factors for coronary artery disease include diabetes mellitus, hypertension, obesity, sedentary lifestyle, dyslipidemia, post-menopausal and family history. Current diabetic treatment includes insulin injections. She is compliant with treatment most of the time. Her weight is fluctuating minimally. She is following a generally healthy (she admits to consumption of large quantities of soda.) diet. When asked about meal planning, she reported none. She has not had a previous visit with a dietitian. She never participates in exercise. Her home blood glucose trend is decreasing steadily. Her breakfast blood glucose range is generally 140-180 mg/dl. Her lunch blood glucose range is generally 180-200 mg/dl. Her dinner blood glucose range is generally 180-200 mg/dl. Her bedtime blood glucose range is generally 180-200 mg/dl. Her overall blood glucose range is 180-200 mg/dl. (She presents with continued improvement in her glycemic profile.  She is currently on insulin U500 70 units 3 times a day.  Her point-of-care A1c is 7.8%, dramatically improving from 13.9%.  Her CGM shows 70% time range, 25% above range.  She has no significant hypoglycemia.   ) An ACE inhibitor/angiotensin II receptor blocker is being taken.  Hyperlipidemia This is a chronic problem. The current episode started more than 1 year ago. The problem is  uncontrolled. Exacerbating diseases include diabetes and obesity. Pertinent negatives include no myalgias. Current antihyperlipidemic treatment includes statins. Risk factors for coronary artery disease include dyslipidemia, diabetes mellitus, hypertension, obesity and a sedentary lifestyle.    Review of systems: Limited as above   Objective:    BP 128/78   Pulse 88   Ht '5\' 5"'  (1.651 m)   Wt 195 lb (88.5 kg)   BMI 32.45 kg/m   Wt Readings from Last 3 Encounters:  03/27/20 195 lb (88.5 kg)  01/29/20 190 lb (86.2 kg)  01/14/20 187 lb (84.8 kg)     Diabetic Labs (most recent): Lab Results  Component Value Date   HGBA1C 7.9 (A) 03/27/2020   HGBA1C 13.9 12/20/2019   HGBA1C 14.7 (H) 03/12/2019     Lipid Panel ( most recent) Lipid Panel     Component Value Date/Time   CHOL 146 03/12/2019 0432   TRIG 105 03/12/2019 0432   HDL 44 03/12/2019 0432   CHOLHDL 3.3 03/12/2019 0432   VLDL 21 03/12/2019 0432   LDLCALC 81 03/12/2019 0432   LDLCALC 116 (H) 04/28/2017 1101     Lab Results  Component Value Date   TSH 0.237 (L) 03/20/2020   TSH 0.267 (L) 12/20/2019   TSH 0.31 (L) 04/28/2017   TSH 0.667 04/16/2012   FREET4 1.04 03/20/2020   FREET4 1.34 12/20/2019   FREET4 1.1 04/28/2017       Assessment & Plan:   1. 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  She presents with continued improvement in her glycemic profile.  She is currently on insulin U500 70 units 3 times a day.  Her point-of-care A1c is 7.8%, dramatically improving from 13.9%.  Her CGM shows 70% time range, 25% above range.  She has no significant hypoglycemia.    -her diabetes is complicated by CVA, obesity/ sedentary life and [name] remains at extremely high risk for more acute and chronic complications which include CAD, CVA, CKD, retinopathy, and neuropathy. These are all discussed in detail with the patient.  -  I have counseled her on diet management and weight loss,  by adopting a carbohydrate restricted/protein rich diet.  She admits to continued consumption of large quantities of soda, which explains the extremely high insulin resistance in her case.  - she acknowledges that there is a room for improvement in her food and drink choices. - Suggestion is made for her to avoid simple carbohydrates  from her diet including Cakes, Sweet Desserts, Ice Cream, Soda (diet and regular), Sweet Tea, Candies, Chips, Cookies, Store Bought Juices, Alcohol in Excess of  1-2 drinks a day, Artificial Sweeteners,  Coffee Creamer, and "Sugar-free" Products, Lemonade. This will help patient to have more stable blood glucose profile and potentially avoid unintended weight gain.   - I encouraged her to switch to  unprocessed or minimally processed complex starch and increased protein intake (animal or plant source), fruits, and vegetables.  - she is advised to stick to a routine mealtimes to eat 3 meals  a day and avoid unnecessary snacks ( to snack only to correct hypoglycemia).   - I have approached her with the following individualized plan to manage diabetes and patient agrees:     She is at extremely  high risk for hypoglycemia from inadvertent use of insulin, especially if she decides to decrease her carbs consumption.  -I had a long discussion about the need for stay engaged for proper monitoring and safe use of insulin.  She is encouraged to continue to use her Dexcom CGM at all times.   -She is advised to continue her Humulin U500 70 units 3 times a day  20 minutes before her 3  major meals, associated with strict monitoring of blood glucose 4 times a day minimum.  I advised her to rotate her injection site.    -Once again, she is encouraged to call clinic for blood glucose levels less than 70 or above 300 mg /dl. -She is advised to continue Metformin 1000 mg p.o. twice daily, Trulicity 1.5 mg subcutaneously weekly, as well as glipizide 10 mg XL p.o. daily at breakfast.      2) Lipids/HPL: Her most recent lipid panel showed uncontrolled LDL at 116, improving from 141.  She is advised to continue atorvastatin 20 mg p.o. nightly.         Side effects and precautions discussed with her.    3) hypertension:  Her blood pressure is controlled to target. She is advised to continue lisinopril 2.5 mg p.o. daily.    4) weight management: Her BMI 32.45- -a candidate for modest weight loss. Carbohydrate restrictions and exercise regimen discussed in detail with her.  Her repeat thyroid function tests are better than before, still consistent with mild subclinical hyperthyroidism.  She will not need intervention for this at this time.  She will have repeat TSH/free T4 along with her next blood work.  - I advised patient to maintain close follow up with Redmond [employer], MD for primary care needs.  - Time spent on this patient care encounter:  35 min, of which > 50% was spent in  counseling and the rest reviewing her blood glucose logs , discussing her hypoglycemia and hyperglycemia episodes, reviewing her current and  previous labs / studies  ( including abstraction from other facilities) and medications  doses and developing a  long term treatment plan and documenting her care.   Please refer to Patient Instructions for Blood Glucose Monitoring and Insulin/Medications Dosing Guide"  in media tab for additional information. Please  also refer  to " Patient Self Inventory" in the Media  tab for reviewed elements of pertinent patient history.  Breana Kirt Boys participated in the discussions, expressed understanding, and voiced agreement with the above plans.  All questions were answered to her satisfaction. she is encouraged to contact clinic should she have any questions or concerns prior to her return visit.  Follow up plan: - Return for F/U with Pre-visit Labs, Meter, Logs, A1c here., Urine MA - NV, ABI in Office NV.  Glade Lloyd, MD [facility] [address] [place], [place] [address] Phone: [phone]  Fax: [phone]    03/27/2020, 12:20 PM  This note was partially dictated with voice recognition software. Similar sounding words can be transcribed inadequately or may not  be corrected upon review.

## 2020-03-27 NOTE — Patient Instructions (Signed)
                                     Advice for Weight Management  -For most of us the best way to lose weight is by diet management. Generally speaking, diet management means consuming less calories intentionally which over time brings about progressive weight loss.  This can be achieved more effectively by restricting carbohydrate consumption to the minimum possible.  So, it is critically important to know your numbers: how much calorie you are consuming and how much calorie you need. More importantly, our carbohydrates sources should be unprocessed or minimally processed complex starch food items.   Sometimes, it is important to balance nutrition by increasing protein intake (animal or plant source), fruits, and vegetables.  -Sticking to a routine mealtime to eat 3 meals a day and avoiding unnecessary snacks is shown to have a big role in weight control. Under normal circumstances, the only time we lose real weight is when we are hungry, so allow hunger to take place- hunger means no food between meal times, only water.  It is not advisable to starve.   -It is better to avoid simple carbohydrates including: Cakes, Sweet Desserts, Ice Cream, Soda (diet and regular), Sweet Tea, Candies, Chips, Cookies, Store Bought Juices, Alcohol in Excess of  1-2 drinks a day, Lemonade,  Artificial Sweeteners, Doughnuts, Coffee Creamers, "Sugar-free" Products, etc, etc.  This is not a complete list.....    -Consulting with certified diabetes educators is proven to provide you with the most accurate and current information on diet.  Also, you may be  interested in discussing diet options/exchanges , we can schedule a visit with [name], RDN, CDE for individualized nutrition education.  -Exercise: If you are able: 30 -60 minutes a day ,4 days a week, or 150 minutes a week.  The longer the better.  Combine stretch, strength, and aerobic activities.  If you were told in the  past that you have high risk for cardiovascular diseases, you may seek evaluation by your heart doctor prior to initiating moderate to intense exercise programs.                                  Additional Care Considerations for Diabetes   -Diabetes  is a chronic disease.  The most important care consideration is regular follow-up with your diabetes care provider with the goal being avoiding or delaying its complications and to take advantage of advances in medications and technology.    -Type 2 diabetes is known to coexist with other important comorbidities such as high blood pressure and high cholesterol.  It is critical to control not only the diabetes but also the high blood pressure and high cholesterol to minimize and delay the risk of complications including coronary artery disease, stroke, amputations, blindness, etc.    - Studies showed that people with diabetes will benefit from a class of medications known as ACE inhibitors and statins.  Unless there are specific reasons not to be on these medications, the standard of care is to consider getting one from these groups of medications at an optimal doses.  These medications are generally considered safe and proven to help protect the heart and the kidneys.    - People with diabetes are encouraged to initiate and maintain regular follow-up with eye doctors, foot   doctors, dentists , and if necessary heart and kidney doctors.     - It is highly recommended that people with diabetes quit smoking or stay away from smoking, and get yearly  flu vaccine and pneumonia vaccine at least every 5 years.  One other important lifestyle recommendation is to ensure adequate sleep - at least 6-7 hours of uninterrupted sleep at night.  -Exercise: If you are able: 30 -60 minutes a day, 4 days a week, or 150 minutes a week.  The longer the better.  Combine stretch, strength, and aerobic activities.  If you were told in the past that you have high risk for  cardiovascular diseases, you may seek evaluation by your heart doctor prior to initiating moderate to intense exercise programs.          

## 2020-04-05 ENCOUNTER — Other Ambulatory Visit: Payer: Self-pay

## 2020-04-05 ENCOUNTER — Emergency Department (HOSPITAL_COMMUNITY)
Admission: EM | Admit: 2020-04-05 | Discharge: 2020-04-06 | Disposition: A | Discharge status: Home / Self Care | Payer: BC Managed Care – PPO | Attending: Emergency Medicine | Admitting: Emergency Medicine

## 2020-04-05 ENCOUNTER — Encounter (HOSPITAL_COMMUNITY): Payer: Self-pay

## 2020-04-05 DIAGNOSIS — W19XXXA Unspecified fall, initial encounter: Secondary | ICD-10-CM

## 2020-04-05 DIAGNOSIS — I10 Essential (primary) hypertension: Secondary | ICD-10-CM | POA: Diagnosis not present

## 2020-04-05 DIAGNOSIS — E1165 Type 2 diabetes mellitus with hyperglycemia: Secondary | ICD-10-CM | POA: Diagnosis not present

## 2020-04-05 DIAGNOSIS — Z91010 Allergy to peanuts: Secondary | ICD-10-CM | POA: Diagnosis not present

## 2020-04-05 DIAGNOSIS — J45909 Unspecified asthma, uncomplicated: Secondary | ICD-10-CM | POA: Diagnosis not present

## 2020-04-05 DIAGNOSIS — Z91040 Latex allergy status: Secondary | ICD-10-CM | POA: Insufficient documentation

## 2020-04-05 DIAGNOSIS — Z7984 Long term (current) use of oral hypoglycemic drugs: Secondary | ICD-10-CM | POA: Insufficient documentation

## 2020-04-05 DIAGNOSIS — W01198A Fall on same level from slipping, tripping and stumbling with subsequent striking against other object, initial encounter: Secondary | ICD-10-CM | POA: Insufficient documentation

## 2020-04-05 DIAGNOSIS — R42 Dizziness and giddiness: Secondary | ICD-10-CM | POA: Insufficient documentation

## 2020-04-05 DIAGNOSIS — Z79899 Other long term (current) drug therapy: Secondary | ICD-10-CM | POA: Insufficient documentation

## 2020-04-05 DIAGNOSIS — Z794 Long term (current) use of insulin: Secondary | ICD-10-CM | POA: Insufficient documentation

## 2020-04-05 DIAGNOSIS — R519 Headache, unspecified: Secondary | ICD-10-CM | POA: Diagnosis not present

## 2020-04-05 DIAGNOSIS — R0989 Other specified symptoms and signs involving the circulatory and respiratory systems: Secondary | ICD-10-CM | POA: Diagnosis not present

## 2020-04-05 LAB — CBC WITH DIFFERENTIAL/PLATELET
Abs Immature Granulocytes: 0.03 10*3/uL (ref 0.00–0.07)
Basophils Absolute: 0 10*3/uL (ref 0.0–0.1)
Basophils Relative: 1 %
Eosinophils Absolute: 0 10*3/uL (ref 0.0–0.5)
Eosinophils Relative: 1 %
HCT: 36.8 % (ref 36.0–46.0)
Hemoglobin: 11.9 g/dL — ABNORMAL LOW (ref 12.0–15.0)
Immature Granulocytes: 1 %
Lymphocytes Relative: 14 %
Lymphs Abs: 0.9 10*3/uL (ref 0.7–4.0)
MCH: 28.8 pg (ref 26.0–34.0)
MCHC: 32.3 g/dL (ref 30.0–36.0)
MCV: 89.1 fL (ref 80.0–100.0)
Monocytes Absolute: 0.5 10*3/uL (ref 0.1–1.0)
Monocytes Relative: 8 %
Neutro Abs: 4.9 10*3/uL (ref 1.7–7.7)
Neutrophils Relative %: 75 %
Platelets: 177 10*3/uL (ref 150–400)
RBC: 4.13 MIL/uL (ref 3.87–5.11)
RDW: 13 % (ref 11.5–15.5)
WBC: 6.4 10*3/uL (ref 4.0–10.5)
nRBC: 0 % (ref 0.0–0.2)

## 2020-04-05 LAB — COMPREHENSIVE METABOLIC PANEL
ALT: 41 U/L (ref 0–44)
AST: 69 U/L — ABNORMAL HIGH (ref 15–41)
Albumin: 3.3 g/dL — ABNORMAL LOW (ref 3.5–5.0)
Alkaline Phosphatase: 100 U/L (ref 38–126)
Anion gap: 9 (ref 5–15)
BUN: 15 mg/dL (ref 6–20)
CO2: 24 mmol/L (ref 22–32)
Calcium: 8.2 mg/dL — ABNORMAL LOW (ref 8.9–10.3)
Chloride: 100 mmol/L (ref 98–111)
Creatinine, Ser: 1.13 mg/dL — ABNORMAL HIGH (ref 0.44–1.00)
GFR, Estimated: 58 mL/min — ABNORMAL LOW (ref >60)
Glucose, Bld: 207 mg/dL — ABNORMAL HIGH (ref 70–99)
Potassium: 3.2 mmol/L — ABNORMAL LOW (ref 3.5–5.1)
Sodium: 133 mmol/L — ABNORMAL LOW (ref 135–145)
Total Bilirubin: 0.4 mg/dL (ref 0.3–1.2)
Total Protein: 7.8 g/dL (ref 6.5–8.1)

## 2020-04-05 LAB — LACTIC ACID, PLASMA: Lactic Acid, Venous: 1.5 mmol/L (ref 0.5–1.9)

## 2020-04-05 MED ORDER — SODIUM CHLORIDE 0.9 % IV BOLUS
1000.0000 mL | Freq: Once | INTRAVENOUS | Status: AC
  Administered 2020-04-05: 1000 mL via INTRAVENOUS

## 2020-04-05 MED ORDER — SODIUM CHLORIDE 0.9 % IV SOLN: INTRAVENOUS | Status: DC

## 2020-04-05 NOTE — ED Triage Notes (Signed)
Pt to er, pt states that she fell about 20 minutes ago, states that she got up from bed and was going to do something and got dizzy and she fell.  Pt c/o headache.  Pt to er via ems, per ems pt is dizzy and fell today.  Pt has indwelling 20 in R ac.

## 2020-04-06 ENCOUNTER — Emergency Department (HOSPITAL_COMMUNITY): Payer: BC Managed Care – PPO

## 2020-04-06 NOTE — Discharge Instructions (Addendum)

## 2020-04-06 NOTE — ED Provider Notes (Signed)
[facility] EMERGENCY DEPARTMENT Provider Note   CSN: [id number] Arrival date & time: 04/05/20  2103     History Chief Complaint  Patient presents with  . Fall         [name] is a 55 y.o. female.  Patient brought in by EMS.  Patient states that started feeling bad yesterday but not able to define exactly what that means.  Denies any nausea vomiting or diarrhea abdominal pain chest pain or shortness of breath.  Today she got dizzy fell and hit the back of her head.  Denies any neck pain.  Patient initial blood pressure on arrival was 69/42.  Repeat was 94/68.  Patient's orthostatics were done.  She did not drop her pressures.  Patient received 1 L of IV fluids.  Patient had to be placed in the hallway.  So initial exam was somewhat limited.  Patient denies any room spinning.  Patient has a history of multiple sclerosis listed.  Also history of chronic abdominal pain chronic neck pain.  Has C4 C6 fusion.  Patient states she has had to the Covid vaccines last one was done in April.  Patient states that her throat is scratchy.  Patient did not pass out.        Past Medical History:  Diagnosis Date  . Asthma   . Chronic abdominal pain   . Chronic neck pain    C4-6 fusion  . CONSTIPATION 05/16/2009   Qualifier: Diagnosis of  By: Ronnald Ramp FNP-BC, Kandice L   . Diabetes mellitus without complication ([place])   . Dysphagia 02/23/2012  . Fatty liver   . GERD (gastroesophageal reflux disease)   . Helicobacter pylori gastritis 2011  . Hyperlipidemia   . Hypertension   . LIVER FUNCTION TESTS, ABNORMAL, HX OF 05/14/2009   Qualifier: Diagnosis of  By: Stephan Minister    . Migraine headache   . MIGRAINE, COMMON 05/14/2009   Qualifier: Diagnosis of  By: Stephan Minister    . MS (multiple sclerosis) ([place])   . Sleep apnea     Patient Active Problem List   Diagnosis Date Noted  . Subclinical hyperthyroidism 03/27/2020  . Closed nondisplaced fracture of proximal phalanx of lesser toe of  right foot 12/27/2019  . Fatty liver 12/07/2019  . DM type 2 causing vascular disease ([place]) 04/11/2019  . Facial droop 03/12/2019  . Vitamin D deficiency 08/26/2017  . Uncontrolled type 2 diabetes mellitus with hyperglycemia ([place]) 03/15/2017  . Essential hypertension, benign 03/15/2017  . Class 2 severe obesity due to excess calories with serious comorbidity and body mass index (BMI) of 35.0 to 35.9 in adult ([place]) 03/15/2017  . Spondylosis of thoracic region without myelopathy or radiculopathy 07/02/2014  . Abdominal pain, epigastric 10/19/2013  . Unspecified constipation 10/19/2013  . Pupillary abnormality of both eyes 03/09/2013  . Chest pain 04/16/2012  . Abnormal EKG 04/16/2012  . HTN (hypertension) 04/16/2012  . Mixed hyperlipidemia 04/16/2012  . Multiple sclerosis ([place]) 04/16/2012  . 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 Dysphagia 02/23/2012  . Back pain, chronic 01/11/2012  . Thoracic radiculopathy 01/11/2012  . Occipital neuralgia 01/06/2012  . Depression 05/07/2011  . Insomnia 05/07/2011  . Sleep apnea 05/07/2011  . Cervical spondylosis 12/23/2010  . FATTY LIVER DISEASE 05/16/2009  . HELICOBACTER PYLORI GASTRITIS 05/14/2009  . ASTHMA 05/14/2009  . Esophageal reflux 05/14/2009  . NAUSEA 05/14/2009  . Diarrhea 05/14/2009  . DEPRESSION, HX OF 05/14/2009  . Personal history of noncompliance with  medical treatment, presenting hazards to health 05/14/2009    Past Surgical History:  Procedure Laterality Date  . ABDOMINAL HYSTERECTOMY    . APPENDECTOMY    . BACK SURGERY  03/07/2012   Nerve Stimulator  . CHOLECYSTECTOMY    . COLONOSCOPY  07/09/2009   RMR; normal rectum aside from anal canal hemorrhoids/scattered left-sided diverticula  . ESOPHAGOGASTRODUODENOSCOPY  05/22/2009   RMR; normal /small HH  . ESOPHAGOGASTRODUODENOSCOPY  2007   RMR: non-critical Schatzki's rin, non-maniuplated  . ESOPHAGOGASTRODUODENOSCOPY N/A 10/24/2013   Procedure:  ESOPHAGOGASTRODUODENOSCOPY (EGD);  Surgeon: Daneil Dolin, MD;  Location: [facility] ENDO SU[facility];  Service: Endoscopy;  Laterality: N/A;  9:45  . ESOPHAGOGASTRODUODENOSCOPY (EGD) WITH ESOPHAGEAL DILATION  03/17/2012   NIO:[id number] appearing esophageal mucosa of uncertain significance. Status post Venia Minks dilation followed by esophageal bx  . MALONEY DILATION N/A 10/24/2013   Procedure: Venia Minks DILATION;  Surgeon: Daneil Dolin, MD;  Location: [facility] ENDO SUITE;  Service: Endoscopy;  Laterality: N/A;  . MANDIBLE SURGERY    . NECK SURGERY     X2, last time 2013     OB History    Gravida  5   Para  5   Term  5   Preterm      AB      Living        SAB      IAB      Ectopic      Multiple      Live Births              Family History  Problem Relation Age of Onset  . Cancer Mother        unknown type  . Diabetes Father   . Diabetes Sister   . Colon cancer Neg Hx     Social History   Tobacco Use  . Smoking status: Never Smoker  . Smokeless tobacco: Never Used  Vaping Use  . Vaping Use: Never used  Substance Use Topics  . Alcohol use: No  . Drug use: No    Home Medications Prior to Admission medications   Medication Sig Start Date End Date Taking? Authorizing Provider  albuterol (VENTOLIN HFA) 108 (90 Base) MCG/ACT inhaler Inhale 1-2 puffs into the lungs every 6 (six) hours as needed for wheezing or shortness of breath.  01/02/19   [provider]  baclofen (LIORESAL) 20 MG tablet Take 20 mg by mouth 3 (three) times daily.  05/08/14   [provider]  blood glucose meter kit and supplies KIT Dispense based on patient and insurance preference. Use up to four times daily as directed. (FOR ICD-9 250.00, 250.01). 03/15/19   Geradine Girt, DO  busPIRone (BUSPAR) 10 MG tablet Take 10 mg by mouth daily as needed. For anxiety 08/29/18   [provider]  citalopram (CELEXA) 40 MG tablet Take 40 mg by mouth every evening.     [provider]   Continuous Blood Gluc Sensor (DEXCOM G6 SENSOR) MISC 4 Pieces by Does not apply route once a week. 12/20/19   Cassandria Anger, MD  Continuous Blood Gluc Transmit (DEXCOM G6 TRANSMITTER) MISC 1 Piece by Does not apply route as directed. 12/20/19   Cassandria Anger, MD  gabapentin (NEURONTIN) 800 MG tablet Take 800 mg by mouth 4 (four) times daily. 01/02/19   [provider]  glipiZIDE (GLUCOTROL XL) 10 MG 24 hr tablet Take 1 tablet (10 mg total) by mouth daily with breakfast. 12/20/19   Loni Beckwith  W, MD  Insulin Pen Needle (B-D ULTRAFINE III SHORT PEN) 31G X 8 MM MISC USE AS DIRECTED TID 01/03/20   Cassandria Anger, MD  insulin regular human CONCENTRATED (HUMULIN R U-500 KWIKPEN) 500 UNIT/ML kwikpen Inject 70 Units into the skin 3 (three) times daily with meals. 03/27/20   Cassandria Anger, MD  lisinopril (PRINIVIL,ZESTRIL) 2.5 MG tablet Take 2.5 mg by mouth every evening.     [provider]  metFORMIN (GLUCOPHAGE) 500 MG tablet Take 2 tablets (1,000 mg total) by mouth 2 (two) times daily. 12/20/19   Cassandria Anger, MD  metoprolol succinate (TOPROL-XL) 50 MG 24 hr tablet Take 1 tablet (50 mg total) by mouth daily. Take with or immediately following a meal. 01/29/20 04/28/20  Imogene Burn, PA-C  ondansetron (ZOFRAN) 4 MG tablet Take 4 mg by mouth 4 (four) times daily as needed. 03/06/19   [provider]  Oxycodone HCl 10 MG TABS Take 10 mg by mouth every 4 (four) hours as needed (for pain).  01/03/19   [provider]  pravastatin (PRAVACHOL) 40 MG tablet Take 1 tablet (40 mg total) by mouth every evening. 03/15/19   Black, Lezlie Octave, NP  TRULICITY 1.5 QM/5.7QI SOPN INJECT 1 SYRINGE INTO THE SKIN ONCE WEEKLY. 02/12/20   Cassandria Anger, MD    Allergies    Peanut-containing drug products, Shellfish allergy, Gluten meal, Latex, Prednisone, Nexium [esomeprazole magnesium], and Tape  Review of Systems   Review of Systems   Constitutional: Positive for fatigue. Negative for chills and fever.  HENT: Positive for sore throat. Negative for rhinorrhea.   Eyes: Negative for visual disturbance.  Respiratory: Negative for cough and shortness of breath.   Cardiovascular: Negative for chest pain and leg swelling.  Gastrointestinal: Negative for abdominal pain, diarrhea, nausea and vomiting.  Genitourinary: Negative for dysuria.  Musculoskeletal: Negative for back pain and neck pain.  Skin: Negative for rash.  Neurological: Positive for dizziness and headaches. Negative for light-headedness.  Hematological: Does not bruise/bleed easily.  Psychiatric/Behavioral: Negative for confusion.    Physical Exam Updated Vital Signs BP 94/68 (BP Location: Left Arm)   Pulse 99   Temp 98.4 F (36.9 C) (Oral)   Resp 18   Ht 1.651 m ($Remove'5\' 5"'itJQDVp$ )   Wt 81.6 kg   SpO2 96%   BMI 29.95 kg/m   Physical Exam Vitals and nursing note reviewed.  Constitutional:      General: She is not in acute distress.    Appearance: Normal appearance. She is well-developed and well-nourished.  HENT:     Head: Normocephalic and atraumatic.     Mouth/Throat:     Mouth: Mucous membranes are dry.  Eyes:     Extraocular Movements: Extraocular movements intact.     Conjunctiva/sclera: Conjunctivae normal.     Pupils: Pupils are equal, round, and reactive to light.  Cardiovascular:     Rate and Rhythm: Normal rate and regular rhythm.     Heart sounds: No murmur heard.   Pulmonary:     Effort: Pulmonary effort is normal. No respiratory distress.     Breath sounds: Normal breath sounds.  Abdominal:     Palpations: Abdomen is soft.     Tenderness: There is no abdominal tenderness.  Musculoskeletal:        General: No edema. Normal range of motion.     Cervical back: Normal range of motion and neck supple. No tenderness.  Skin:    General: Skin is warm  and dry.     Capillary Refill: Capillary refill takes less than 2 seconds.  Neurological:      General: No focal deficit present.     Mental Status: She is alert and oriented to person, place, and time.     Cranial Nerves: No cranial nerve deficit.     Sensory: No sensory deficit.     Motor: No weakness.  Psychiatric:        Mood and Affect: Mood and affect normal.     ED Results / Procedures / Treatments   Labs (all labs ordered are listed, but only abnormal results are displayed) Labs Reviewed  CBC WITH DIFFERENTIAL/PLATELET - Abnormal; Notable for the following components:      Result Value   Hemoglobin 11.9 (*)    All other components within normal limits  COMPREHENSIVE METABOLIC PANEL - Abnormal; Notable for the following components:   Sodium 133 (*)    Potassium 3.2 (*)    Glucose, Bld 207 (*)    Creatinine, Ser 1.13 (*)    Calcium 8.2 (*)    Albumin 3.3 (*)    AST 69 (*)    GFR, Estimated 58 (*)    All other components within normal limits  LACTIC ACID, PLASMA    EKG EKG Interpretation  Date/Time:  Friday April 05 2020 21:26:55 EST Ventricular Rate:  95 PR Interval:  142 QRS Duration: 94 QT Interval:  384 QTC Calculation: 482 R Axis:   -39 Text Interpretation: Normal sinus rhythm Left axis deviation Prolonged QT Abnormal ECG No significant change since last tracing Confirmed by Fredia Sorrow [phone]) on 04/05/2020 9:47:32 PM   Radiology No results found.  Procedures Procedures (including critical care time)  Medications Ordered in ED Medications  0.9 %  sodium chloride infusion ( Intravenous New Bag/Given 04/05/20 2158)  sodium chloride 0.9 % bolus 1,000 mL (0 mLs Intravenous Stopped 04/05/20 2248)    ED Course  I have reviewed the triage vital signs and the nursing notes.  Pertinent labs & imaging results that were available during my care of the patient were reviewed by me and considered in my medical decision making (see chart for details).    MDM Rules/Calculators/A&P                          Patient will undergo CT head CT neck  since she has had a history of chronic neck pain.  But her complaint is mostly to the back of her head.  Chest x-ray will be done.  We will hold off on Covid testing till we see results.  Not clear whether patient will require admission.  Patient needs repeat blood pressure checks.  Patient we turned over to the overnight emergency physician Dr. Christy Gentles.  We will follow up on her results     Final Clinical Impression(s) / ED Diagnoses Final diagnoses:  Fall, initial encounter  Dizziness    Rx / DC Orders ED Discharge Orders    None       Fredia Sorrow, MD 04/06/20 [phone]

## 2020-04-06 NOTE — ED Provider Notes (Signed)
Patient is improved.  She ambulated without difficulty.  CT imaging is negative for acute injury.  Patient is not entirely sure what occurred, but she reports she had some body jerking afterwards.  No known history of seizures.  Advised her to follow-up with her neurologist.  Patient is otherwise stable for discharge home. Blood pressure is improved BP 136/87 (BP Location: Left Arm)   Pulse 86   Temp 98.4 F (36.9 C) (Oral)   Resp 20   Ht 1.651 m (5\' 5" )   Wt 81.6 kg   SpO2 98%   BMI 29.95 kg/m     , MD 04/06/20 [phone]

## 2020-04-06 NOTE — ED Notes (Signed)
Pt ambulated in hallway without assistants. Pt states feels better than when she first got here. Des not feel as dizzy when standing and walking

## 2020-04-18 ENCOUNTER — Other Ambulatory Visit: Payer: Self-pay | Admitting: "Endocrinology

## 2020-04-30 ENCOUNTER — Telehealth: Payer: Self-pay | Admitting: "Endocrinology

## 2020-04-30 ENCOUNTER — Other Ambulatory Visit: Payer: Self-pay | Admitting: "Endocrinology

## 2020-04-30 MED ORDER — OZEMPIC (1 MG/DOSE) 4 MG/3ML ~~LOC~~ SOPN: 1.0000 mg | PEN_INJECTOR | SUBCUTANEOUS | 2 refills | Status: DC

## 2020-04-30 NOTE — Telephone Encounter (Signed)
Patient advised, verbalized understanding

## 2020-04-30 NOTE — Telephone Encounter (Signed)
Please advise 

## 2020-04-30 NOTE — Telephone Encounter (Signed)
Pt is calling and states that Trulicity is going to cost her $400 and she can not afford that. She is needing something else

## 2020-04-30 NOTE — Telephone Encounter (Signed)
I sent Ozempic 1mg  sq weekly and stopped trulicity.

## 2020-05-06 ENCOUNTER — Ambulatory Visit (HOSPITAL_COMMUNITY): Admission: RE | Admit: 2020-05-06 | Payer: BC Managed Care – PPO | Source: Ambulatory Visit

## 2020-05-19 NOTE — Progress Notes (Deleted)
Cardiology Office Note:    Date:  05/19/2020   ID:  [name], DOB Dec 19, 1965, MRN [id number]  PCP:  Debbie Nevins, MD  Cardiologist:  Debbie Rich, MD   Referring MD: Debbie Nevins, MD   No chief complaint on file. ***  History of Present Illness:    [name] is a 55 y.o. 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 She completed 3 weeks of DAPT, now on plavix monotherapy. A1c at that time was 13%. Carotid artery [place] with no significant atherosclerosis. Echo 02/2019 with normal EF 60-65%, garde 1 DD, no RWMA, and no significant valvular disease.   She was seen by [name] 01/29/20 and was doing well, although forgets to take her metoprolol.   She had an ER visit on 04/05/20 for dizziness with fall hitting the back of her head. BP on arrival was 69/42, was 94/68 on repeat. She was not orthostatic. She was hydrated with 1L NS. She denied dizziness and loss of consciousness. She did report some jerking after the event and was referred to neurology. She was discharged without admission.       Questionable syncope   Hypertension - maintained on 2.5 mg lisinopril, 50 mg toprol   Palpitations - heart monitor 2020 with PACs and PVCs - take 50 mg torpol   Hyperlipidemia - check KPN - continue 40 mg pravastatin    Past Medical History:  Diagnosis Date  . Asthma   . Chronic abdominal pain   . Chronic neck pain    C4-6 fusion  . CONSTIPATION 05/16/2009   Qualifier: Diagnosis of  By: Yetta Barre FNP-BC, Kandice L   . Diabetes mellitus without complication ([facility])   . Dysphagia 02/23/2012  . Fatty liver   . GERD (gastroesophageal reflux disease)   . Helicobacter pylori gastritis 2011  . Hyperlipidemia   . Hypertension   . LIVER FUNCTION TESTS, ABNORMAL, HX OF 05/14/2009   Qualifier: Diagnosis of  By: Ricard Dillon    . Migraine headache   . MIGRAINE, COMMON 05/14/2009   Qualifier: Diagnosis of  By: Ricard Dillon    . MS (multiple sclerosis) ([facility])   . Sleep apnea     Past Surgical History:  Procedure Laterality Date  . ABDOMINAL HYSTERECTOMY    . APPENDECTOMY    . BACK SURGERY  03/07/2012   Nerve Stimulator  . CHOLECYSTECTOMY    . COLONOSCOPY  07/09/2009   RMR; normal rectum aside from anal canal hemorrhoids/scattered left-sided diverticula  . ESOPHAGOGASTRODUODENOSCOPY  05/22/2009   RMR; normal /small HH  . ESOPHAGOGASTRODUODENOSCOPY  2007   RMR: non-critical Schatzki's rin, non-maniuplated  . ESOPHAGOGASTRODUODENOSCOPY N/A 10/24/2013   Procedure: ESOPHAGOGASTRODUODENOSCOPY (EGD);  Surgeon: Corbin Ade, MD;  [facility];  Service: Endoscopy;  Laterality: N/A;  9:45  . ESOPHAGOGASTRODUODENOSCOPY (EGD) WITH ESOPHAGEAL DILATION  03/17/2012   FFM:[id number] appearing esophageal mucosa of uncertain significance. Status post [name] dilation followed by esophageal bx  . MALONEY DILATION N/A 10/24/2013   Procedure: [name] DILATION;  Surgeon: Corbin Ade, MD;  Location: [facility];  Service: Endoscopy;  Laterality: N/A;  . MANDIBLE SURGERY    . NECK SURGERY     X2, last time 2013    Current Medications: No outpatient medications have been marked as taking for the 05/21/20 encounter (Appointment) with [name],  Debbie Rutherford, PA.     Allergies:   Peanut-containing drug products, Shellfish allergy, Gluten meal, Latex, Prednisone, Nexium [esomeprazole magnesium], and Tape   Social History   Socioeconomic History  . Marital status: Married    Spouse name: Not on file  . Number of children: Not on file  . Years of education: Not on file  . Highest education level: Not on file  Occupational History  . Not on file  Tobacco Use  . Smoking status: Never Smoker  . Smokeless tobacco: Never Used  Vaping Use  . Vaping Use: Never used  Substance and Sexual Activity  .  Alcohol use: No  . Drug use: No  . Sexual activity: Yes    Birth control/protection: Surgical  Other Topics Concern  . Not on file  Social History Narrative   5 kids, raising grandchild   Social Determinants of Health   Financial Resource Strain: Not on file  Food Insecurity: Not on file  Transportation Needs: Not on file  Physical Activity: Not on file  Stress: Not on file  Social Connections: Not on file     Family History: The patient's ***family history includes Cancer in her mother; Diabetes in her father and sister. There is no history of Colon cancer.  ROS:   Please see the history of present illness.    *** All other systems reviewed and are negative.  EKGs/Labs/Other Studies Reviewed:    The following studies were reviewed today: ***  EKG:  EKG is *** ordered today.  The ekg ordered today demonstrates ***  Recent Labs: 03/20/2020: TSH 0.237 04/05/2020: ALT 41; BUN 15; Creatinine, Ser 1.13; Hemoglobin 11.9; Platelets 177; Potassium 3.2; Sodium 133  Recent Lipid Panel    Component Value Date/Time   CHOL 146 03/12/2019 0432   TRIG 105 03/12/2019 0432   HDL 44 03/12/2019 0432   CHOLHDL 3.3 03/12/2019 0432   VLDL 21 03/12/2019 0432   LDLCALC 81 03/12/2019 0432   LDLCALC 116 (H) 04/28/2017 1101    Physical Exam:    VS:  There were no vitals taken for this visit.    Wt Readings from Last 3 Encounters:  04/05/20 180 lb (81.6 kg)  03/27/20 195 lb (88.5 kg)  01/29/20 190 lb (86.2 kg)     GEN: *** Well nourished, well developed in no acute distress HEENT: Normal NECK: No JVD; No carotid bruits LYMPHATICS: No lymphadenopathy CARDIAC: ***RRR, no murmurs, rubs, gallops RESPIRATORY:  Clear to auscultation without rales, wheezing or rhonchi  ABDOMEN: Soft, non-tender, non-distended MUSCULOSKELETAL:  No edema; No deformity  SKIN: Warm and dry NEUROLOGIC:  Alert and oriented x 3 PSYCHIATRIC:  Normal affect   ASSESSMENT:    No diagnosis found. PLAN:     In order of problems listed above:  No diagnosis found.   Medication Adjustments/Labs and Tests Ordered: Current medicines are reviewed at length with the patient today.  Concerns regarding medicines are outlined above.  No orders of the defined types were placed in this encounter.  No orders of the defined types were placed in this encounter.   Signed, Marcelino Duster, [place]  05/19/2020 9:38 PM    [facility]

## 2020-05-21 ENCOUNTER — Ambulatory Visit: Payer: BC Managed Care – PPO | Admitting: Physician Assistant

## 2020-06-21 LAB — COMPREHENSIVE METABOLIC PANEL
ALT: 18 IU/L (ref 0–32)
AST: 25 IU/L (ref 0–40)
Albumin/Globulin Ratio: 0.8 — ABNORMAL LOW (ref 1.2–2.2)
Albumin: 3.4 g/dL — ABNORMAL LOW (ref 3.8–4.9)
Alkaline Phosphatase: 136 IU/L — ABNORMAL HIGH (ref 44–121)
BUN/Creatinine Ratio: 16 (ref 9–23)
BUN: 11 mg/dL (ref 6–24)
Bilirubin Total: 0.4 mg/dL (ref 0.0–1.2)
CO2: 24 mmol/L (ref 20–29)
Calcium: 8.5 mg/dL — ABNORMAL LOW (ref 8.7–10.2)
Chloride: 97 mmol/L (ref 96–106)
Creatinine, Ser: 0.67 mg/dL (ref 0.57–1.00)
Globulin, Total: 4.3 g/dL (ref 1.5–4.5)
Glucose: 339 mg/dL — ABNORMAL HIGH (ref 65–99)
Potassium: 3.9 mmol/L (ref 3.5–5.2)
Sodium: 136 mmol/L (ref 134–144)
Total Protein: 7.7 g/dL (ref 6.0–8.5)
eGFR: 104 mL/min/{1.73_m2} (ref >59)

## 2020-06-21 LAB — LIPID PANEL
Chol/HDL Ratio: 3.1 ratio (ref 0.0–4.4)
Cholesterol, Total: 146 mg/dL (ref 100–199)
HDL: 47 mg/dL (ref >39)
LDL Chol Calc (NIH): 80 mg/dL (ref 0–99)
Triglycerides: 106 mg/dL (ref 0–149)
VLDL Cholesterol Cal: 19 mg/dL (ref 5–40)

## 2020-06-21 LAB — T4, FREE: Free T4: 1.17 ng/dL (ref 0.82–1.77)

## 2020-06-21 LAB — TSH: TSH: 0.303 u[IU]/mL — ABNORMAL LOW (ref 0.450–4.500)

## 2020-06-26 ENCOUNTER — Other Ambulatory Visit: Payer: Self-pay

## 2020-06-26 ENCOUNTER — Ambulatory Visit (INDEPENDENT_AMBULATORY_CARE_PROVIDER_SITE_OTHER): Payer: BC Managed Care – PPO | Admitting: "Endocrinology

## 2020-06-26 ENCOUNTER — Encounter: Payer: Self-pay | Admitting: "Endocrinology

## 2020-06-26 VITALS — BP 116/72 | HR 104 | Ht 65.0 in | Wt 191.4 lb

## 2020-06-26 DIAGNOSIS — E782 Mixed hyperlipidemia: Secondary | ICD-10-CM | POA: Diagnosis not present

## 2020-06-26 DIAGNOSIS — E1159 Type 2 diabetes mellitus with other circulatory complications: Secondary | ICD-10-CM | POA: Diagnosis not present

## 2020-06-26 DIAGNOSIS — I10 Essential (primary) hypertension: Secondary | ICD-10-CM

## 2020-06-26 LAB — POCT UA - MICROALBUMIN
Creatinine, POC: 300 mg/dL
Microalbumin Ur, POC: 150 mg/L

## 2020-06-26 LAB — POCT GLYCOSYLATED HEMOGLOBIN (HGB A1C): HbA1c, POC (controlled diabetic range): 9.8 % — AB (ref 0.0–7.0)

## 2020-06-26 MED ORDER — HUMULIN R U-500 KWIKPEN 500 UNIT/ML ~~LOC~~ SOPN
80.0000 [IU] | PEN_INJECTOR | Freq: Three times a day (TID) | SUBCUTANEOUS | 2 refills | Status: DC
Start: 2020-06-26 — End: 2020-09-23

## 2020-06-26 MED ORDER — DEXCOM G6 SENSOR MISC
4.0000 | 2 refills | Status: DC
Start: 2020-06-26 — End: 2021-10-15

## 2020-06-26 MED ORDER — DEXCOM G6 TRANSMITTER MISC
1.0000 | 1 refills | Status: DC
Start: 2020-06-26 — End: 2021-10-15

## 2020-06-26 MED ORDER — LISINOPRIL 5 MG PO TABS: 5.0000 mg | ORAL_TABLET | Freq: Every day | ORAL | 3 refills | Status: DC

## 2020-06-26 NOTE — Progress Notes (Signed)
                                                                                                        06/26/2020, 1:03 PM    Endocrinology follow-up note    Subjective:    Patient ID: [name], female    DOB: 03/13/1966.  [name] is being seen in follow-up for management of currently uncontrolled symptomatic 2 diabetes, hyperlipidemia, hypertension. PMD:   Fusco, Lawrence, MD.   Past Medical History:  Diagnosis Date  . Asthma   . Chronic abdominal pain   . Chronic neck pain    C4-6 fusion  . CONSTIPATION 05/16/2009   Qualifier: Diagnosis of  By: Jones FNP-BC, Kandice L   . Diabetes mellitus without complication (HCC)   . Dysphagia 02/23/2012  . Fatty liver   . GERD (gastroesophageal reflux disease)   . Helicobacter pylori gastritis 2011  . Hyperlipidemia   . Hypertension   . LIVER FUNCTION TESTS, ABNORMAL, HX OF 05/14/2009   Qualifier: Diagnosis of  By: Urshel, [place]    . Migraine headache   . MIGRAINE, COMMON 05/14/2009   Qualifier: Diagnosis of  By: Urshel, [place]    . MS (multiple sclerosis) (HCC)   . Sleep apnea    Past Surgical History:  Procedure Laterality Date  . ABDOMINAL HYSTERECTOMY    . APPENDECTOMY    . BACK SURGERY  03/07/2012   Nerve Stimulator  . CHOLECYSTECTOMY    . COLONOSCOPY  07/09/2009   RMR; normal rectum aside from anal canal hemorrhoids/scattered left-sided diverticula  . ESOPHAGOGASTRODUODENOSCOPY  05/22/2009   RMR; normal /small HH  . ESOPHAGOGASTRODUODENOSCOPY  2007   RMR: non-critical Schatzki's rin, non-maniuplated  . ESOPHAGOGASTRODUODENOSCOPY N/A 10/24/2013   Procedure: ESOPHAGOGASTRODUODENOSCOPY (EGD);  Surgeon: Robert M Rourk, MD;  Location: [facility];  Service: Endoscopy;  Laterality: N/A;  9:45  . ESOPHAGOGASTRODUODENOSCOPY (EGD) WITH ESOPHAGEAL DILATION  03/17/2012   RMR:Abnormal appearing esophageal mucosa of uncertain significance. Status post Maloney dilation followed by esophageal bx  .  MALONEY DILATION N/A 10/24/2013   Procedure: MALONEY DILATION;  Surgeon: Robert M Rourk, MD;  Location: [facility];  Service: Endoscopy;  Laterality: N/A;  . MANDIBLE SURGERY    . NECK SURGERY     X2, last time 2013   Social History   Socioeconomic History  . Marital status: Married    Spouse name: Not on file  . Number of children: Not on file  . Years of education: Not on file  . Highest education level: Not on file  Occupational History  . Not on file  Tobacco Use  . Smoking status: Never Smoker  . Smokeless tobacco: Never Used  Vaping Use  . Vaping Use: Never used  Substance and Sexual Activity  . Alcohol use: No  . Drug use: No  . Sexual activity: Yes    Birth control/protection: Surgical  Other Topics Concern  . Not on file  Social History Narrative   5 kids, raising grandchild   Social Determinants of Health   Financial Resource   Strain: Not on file  Food Insecurity: Not on file  Transportation Needs: Not on file  Physical Activity: Not on file  Stress: Not on file  Social Connections: Not on file   Outpatient Encounter Medications as of 06/26/2020  Medication Sig  . lisinopril (ZESTRIL) 5 MG tablet Take 1 tablet (5 mg total) by mouth daily.  . albuterol (VENTOLIN HFA) 108 (90 Base) MCG/ACT inhaler Inhale 1-2 puffs into the lungs every 6 (six) hours as needed for wheezing or shortness of breath.   . baclofen (LIORESAL) 20 MG tablet Take 20 mg by mouth 3 (three) times daily.   . blood glucose meter kit and supplies KIT Dispense based on patient and insurance preference. Use up to four times daily as directed. (FOR ICD-9 250.00, 250.01).  . busPIRone (BUSPAR) 10 MG tablet Take 10 mg by mouth daily as needed. For anxiety  . citalopram (CELEXA) 40 MG tablet Take 40 mg by mouth every evening.   . Continuous Blood Gluc Sensor (DEXCOM G6 SENSOR) MISC 4 Pieces by Does not apply route once a week.  . Continuous Blood Gluc Transmit (DEXCOM G6 TRANSMITTER) MISC 1 Piece by  Does not apply route as directed.  . gabapentin (NEURONTIN) 800 MG tablet Take 800 mg by mouth 4 (four) times daily.  . glipiZIDE (GLUCOTROL XL) 10 MG 24 hr tablet Take 1 tablet (10 mg total) by mouth daily with breakfast.  . Insulin Pen Needle (B-D ULTRAFINE III SHORT PEN) 31G X 8 MM MISC USE AS DIRECTED TID  . insulin regular human CONCENTRATED (HUMULIN R U-500 KWIKPEN) 500 UNIT/ML kwikpen Inject 80 Units into the skin 3 (three) times daily with meals.  . metFORMIN (GLUCOPHAGE) 500 MG tablet Take 2 tablets (1,000 mg total) by mouth 2 (two) times daily.  . metoprolol succinate (TOPROL-XL) 50 MG 24 hr tablet Take 1 tablet (50 mg total) by mouth daily. Take with or immediately following a meal.  . ondansetron (ZOFRAN) 4 MG tablet Take 4 mg by mouth 4 (four) times daily as needed.  . Oxycodone HCl 10 MG TABS Take 10 mg by mouth every 4 (four) hours as needed (for pain).   . pravastatin (PRAVACHOL) 40 MG tablet Take 1 tablet (40 mg total) by mouth every evening.  . Semaglutide, 1 MG/DOSE, (OZEMPIC, 1 MG/DOSE,) 4 MG/3ML SOPN Inject 1 mg into the skin once a week.  . [DISCONTINUED] Continuous Blood Gluc Sensor (DEXCOM G6 SENSOR) MISC 4 Pieces by Does not apply route once a week.  . [DISCONTINUED] Continuous Blood Gluc Transmit (DEXCOM G6 TRANSMITTER) MISC 1 Piece by Does not apply route as directed.  . [DISCONTINUED] insulin regular human CONCENTRATED (HUMULIN R U-500 KWIKPEN) 500 UNIT/ML kwikpen Inject 70 Units into the skin 3 (three) times daily with meals.  . [DISCONTINUED] lisinopril (PRINIVIL,ZESTRIL) 2.5 MG tablet Take 2.5 mg by mouth every evening.    No facility-administered encounter medications on file as of 06/26/2020.    ALLERGIES: Allergies  Allergen Reactions  . Peanut-Containing Drug Products Anaphylaxis and Hives    Patient states she is allergic to all nuts  . Shellfish Allergy Anaphylaxis  . Gluten Meal   . Latex Other (See Comments)    Reaction: blistering  . Prednisone Hives  and Other (See Comments)    Reaction: causes psychological issues   . Nexium [Esomeprazole Magnesium] Hives  . Tape Rash    Paper tape please    VACCINATION STATUS: Immunization History  Administered Date(s) Administered  . Influenza Split 04/17/2012  .   Pneumococcal Polysaccharide-23 04/17/2012    Diabetes She presents for her follow-up diabetic visit. She has type 2 diabetes mellitus. Onset time: She was diagnosed at approximate age of 48 years. Her disease course has been worsening. There are no hypoglycemic associated symptoms. Pertinent negatives for hypoglycemia include no confusion, pallor or seizures. Associated symptoms include fatigue, polydipsia and polyuria. Pertinent negatives for diabetes include no polyphagia. There are no hypoglycemic complications. Symptoms are worsening. There are no diabetic complications. Risk factors for coronary artery disease include diabetes mellitus, hypertension, obesity, sedentary lifestyle, dyslipidemia, post-menopausal and family history. Current diabetic treatment includes insulin injections. She is compliant with treatment most of the time. Her weight is increasing steadily. She is following a generally healthy (she admits to consumption of large quantities of soda.) diet. When asked about meal planning, she reported none. She has not had a previous visit with a dietitian. She never participates in exercise. Her home blood glucose trend is increasing steadily. Her breakfast blood glucose range is generally >200 mg/dl. Her lunch blood glucose range is generally >200 mg/dl. Her dinner blood glucose range is generally >200 mg/dl. Her bedtime blood glucose range is generally >200 mg/dl. Her overall blood glucose range is >200 mg/dl. (She presents with worsening glycemic profile and point-of-care A1c of 9.8% increasing from 7.9%.    Her CGM analysis shows only 2% range, 98% above range.  No hypoglycemia.  This is despite insulin U500 70 units 3 times daily  along with Metformin, Ozempic, glipizide.   ) An ACE inhibitor/angiotensin II receptor blocker is being taken.  Hyperlipidemia This is a chronic problem. The current episode started more than 1 year ago. The problem is uncontrolled. Exacerbating diseases include diabetes and obesity. Pertinent negatives include no myalgias. Current antihyperlipidemic treatment includes statins. Risk factors for coronary artery disease include dyslipidemia, diabetes mellitus, hypertension, obesity and a sedentary lifestyle.    Review of systems: Limited as above   Objective:    BP 116/72   Pulse (!) 104   Ht 5' 5" (1.651 m)   Wt 191 lb 6.4 oz (86.8 kg)   BMI 31.85 kg/m   Wt Readings from Last 3 Encounters:  06/26/20 191 lb 6.4 oz (86.8 kg)  04/05/20 180 lb (81.6 kg)  03/27/20 195 lb (88.5 kg)     Diabetic Labs (most recent): Lab Results  Component Value Date   HGBA1C 9.8 (A) 06/26/2020   HGBA1C 7.9 (A) 03/27/2020   HGBA1C 13.9 12/20/2019     Lipid Panel ( most recent) Lipid Panel     Component Value Date/Time   CHOL 146 06/20/2020 1149   TRIG 106 06/20/2020 1149   HDL 47 06/20/2020 1149   CHOLHDL 3.1 06/20/2020 1149   CHOLHDL 3.3 03/12/2019 0432   VLDL 21 03/12/2019 0432   LDLCALC 80 06/20/2020 1149   LDLCALC 116 (H) 04/28/2017 1101     Lab Results  Component Value Date   TSH 0.303 (L) 06/20/2020   TSH 0.237 (L) 03/20/2020   TSH 0.267 (L) 12/20/2019   TSH 0.31 (L) 04/28/2017   TSH 0.667 04/16/2012   FREET4 1.17 06/20/2020   FREET4 1.04 03/20/2020   FREET4 1.34 12/20/2019   FREET4 1.1 04/28/2017       Assessment & Plan:   1. 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  She presents with worsening glycemic profile and point-of-care A1c of 9.8% increasing from 7.9%.    Her CGM analysis shows only 2% range, 98% above range.  No   hypoglycemia.  This is despite insulin U500 70 units 3 times daily along with Metformin, Ozempic,  glipizide.  -her diabetes is complicated by CVA, obesity/ sedentary life and Kloey C Mcneary remains at extremely high risk for more acute and chronic complications which include CAD, CVA, CKD, retinopathy, and neuropathy. These are all discussed in detail with the patient.  - I have counseled her on diet management and weight loss, by adopting a carbohydrate restricted/protein rich diet.  She admits to continued consumption of large quantities of soda, which explains the extremely high insulin resistance in her case.  - she acknowledges that there is a room for improvement in her food and drink choices. - Suggestion is made for her to avoid simple carbohydrates  from her diet including Cakes, Sweet Desserts, Ice Cream, Soda (diet and regular), Sweet Tea, Candies, Chips, Cookies, Store Bought Juices, Alcohol in Excess of  1-2 drinks a day, Artificial Sweeteners,  Coffee Creamer, and "Sugar-free" Products, Lemonade. This will help patient to have more stable blood glucose profile and potentially avoid unintended weight gain.   - I encouraged her to switch to  unprocessed or minimally processed complex starch and increased protein intake (animal or plant source), fruits, and vegetables.  - she is advised to stick to a routine mealtimes to eat 3 meals  a day and avoid unnecessary snacks ( to snack only to correct hypoglycemia).   - I have approached her with the following individualized plan to manage diabetes and patient agrees:     She is at extremely  high risk for hypoglycemia from inadvertent use of insulin, especially if she decides to decrease her carbs consumption.  -I had a long discussion about the need for stay engaged for proper monitoring and safe use of insulin.  She is encouraged to continue to use her Dexcom CGM at all times.   -In light of her presentation with loss of control, she will need a higher dose of insulin in order for her to achieve control of diabetes to target again.  She  is advised to increase  her Humulin U500 to 80 units 3 times a day  20 minutes before her 3  major meals, associated with strict monitoring of blood glucose 4 times a day minimum.  I advised her to rotate her injection site.    -Once again, she is encouraged to call clinic for blood glucose levels less than 70 or above 200 mg /dl. -She is advised to continue Metformin 1000 mg p.o. twice daily. Her insurance is providing coverage for Ozempic instead of Trulicity.  She is advised to continue Ozempic 1 mg subcutaneously weekly.  -She is also advised to continue  glipizide 10 mg XL p.o. daily at breakfast.     2) Lipids/HPL: Her most recent lipid panel showed uncontrolled LDL at 116, improving from 141.  She is advised to continue atorvastatin 20 mg p.o. nightly.          Side effects and precautions discussed with her.    3) hypertension:  Her blood pressure is controlled to target.  However, in light of her microalbuminuria, her lisinopril will be increased to 5 mg p.o. daily.   4) weight management: Her BMI 31.8-- -a candidate for modest weight loss. Carbohydrate restrictions and exercise regimen discussed in detail with her.  Her repeat thyroid function tests recently showed mild subclinical hyperthyroidism, did not need antithyroid intervention.    She will have repeat TSH/free T4 along with her next blood work.  -   I advised patient to maintain close follow up with Redmond School, MD for primary care needs.  - Time spent on this patient care encounter:  40 min, of which > 50% was spent in  counseling and the rest reviewing her blood glucose logs , discussing her hypoglycemia and hyperglycemia episodes, reviewing her current and  previous labs / studies  ( including abstraction from other facilities) and medications  doses and developing a  long term treatment plan and documenting her care.   Please refer to Patient Instructions for Blood Glucose Monitoring and Insulin/Medications Dosing  Guide"  in media tab for additional information. Please  also refer to " Patient Self Inventory" in the Media  tab for reviewed elements of pertinent patient history.  Finlay Kirt Boys participated in the discussions, expressed understanding, and voiced agreement with the above plans.  All questions were answered to her satisfaction. she is encouraged to contact clinic should she have any questions or concerns prior to her return visit.   Follow up plan: - Return in about 3 months (around 09/23/2020) for Bring Meter and Logs- A1c in Office, ABI in Office [place].  Glade Lloyd, MD [facility] [address] [place], [place] [address] Phone: [phone]  Fax: [phone]    06/26/2020, 1:03 PM  This note was partially dictated with voice recognition software. Similar sounding words can be transcribed inadequately or may not  be corrected upon review.

## 2020-06-26 NOTE — Patient Instructions (Signed)

## 2020-07-03 ENCOUNTER — Encounter (INDEPENDENT_AMBULATORY_CARE_PROVIDER_SITE_OTHER): Payer: Self-pay | Admitting: *Deleted

## 2020-09-23 ENCOUNTER — Ambulatory Visit (INDEPENDENT_AMBULATORY_CARE_PROVIDER_SITE_OTHER): Payer: BC Managed Care – PPO | Admitting: "Endocrinology

## 2020-09-23 ENCOUNTER — Encounter: Payer: Self-pay | Admitting: "Endocrinology

## 2020-09-23 ENCOUNTER — Other Ambulatory Visit: Payer: Self-pay

## 2020-09-23 VITALS — BP 110/78 | HR 84 | Ht 65.0 in | Wt 184.4 lb

## 2020-09-23 DIAGNOSIS — I10 Essential (primary) hypertension: Secondary | ICD-10-CM

## 2020-09-23 DIAGNOSIS — E0590 Thyrotoxicosis, unspecified without thyrotoxic crisis or storm: Secondary | ICD-10-CM | POA: Diagnosis not present

## 2020-09-23 DIAGNOSIS — E1159 Type 2 diabetes mellitus with other circulatory complications: Secondary | ICD-10-CM | POA: Diagnosis not present

## 2020-09-23 DIAGNOSIS — E782 Mixed hyperlipidemia: Secondary | ICD-10-CM

## 2020-09-23 LAB — POCT GLYCOSYLATED HEMOGLOBIN (HGB A1C): HbA1c, POC (controlled diabetic range): 12.3 % — AB (ref 0.0–7.0)

## 2020-09-23 MED ORDER — NOVOLIN 70/30 FLEXPEN RELION (70-30) 100 UNIT/ML ~~LOC~~ SUPN: 80.0000 [IU] | PEN_INJECTOR | Freq: Two times a day (BID) | SUBCUTANEOUS | 2 refills | Status: DC

## 2020-09-23 MED ORDER — ACCU-CHEK GUIDE VI STRP: ORAL_STRIP | 2 refills | Status: DC

## 2020-09-23 MED ORDER — ACCU-CHEK GUIDE ME W/DEVICE KIT: 1.0000 | PACK | 0 refills | Status: DC

## 2020-09-23 MED ORDER — ACCU-CHEK GUIDE VI STRP
ORAL_STRIP | 2 refills | Status: DC
Start: 2020-09-23 — End: 2020-09-23

## 2020-09-23 NOTE — Progress Notes (Signed)
09/23/2020, 5:23 PM    Endocrinology follow-up note    Subjective:    Patient ID: [name], female    DOB: 07/24/1965.  [name] is being seen in follow-up for management of currently uncontrolled symptomatic 2 diabetes, hyperlipidemia, hypertension. PMD:   Debbie School, Odom.   Past Medical History:  Diagnosis Date   Asthma    Chronic abdominal pain    Chronic neck pain    C4-6 fusion   CONSTIPATION 05/16/2009   Qualifier: Diagnosis of  [name]: [name], [name]    Diabetes mellitus without complication ([place])    Dysphagia 02/23/2012   Fatty liver    GERD (gastroesophageal reflux disease)    Helicobacter pylori gastritis 2011   Hyperlipidemia    Hypertension    LIVER FUNCTION TESTS, ABNORMAL, HX OF 05/14/2009   Qualifier: Diagnosis of  [name]: [name], [name]     Migraine headache    MIGRAINE, COMMON 05/14/2009   Qualifier: Diagnosis of  [name]: Debbie Minister     MS (multiple sclerosis) [facility])    Sleep apnea    Past Surgical History:  Procedure Laterality Date   ABDOMINAL HYSTERECTOMY     APPENDECTOMY     BACK SURGERY  03/07/2012   Nerve Stimulator   CHOLECYSTECTOMY     COLONOSCOPY  07/09/2009   [name]; normal rectum aside from anal canal hemorrhoids/scattered left-sided diverticula   ESOPHAGOGASTRODUODENOSCOPY  05/22/2009   [name]; normal /small HH   ESOPHAGOGASTRODUODENOSCOPY  2007   [name]: non-critical Schatzki's rin, non-maniuplated   ESOPHAGOGASTRODUODENOSCOPY [name] 10/24/2013   Procedure: ESOPHAGOGASTRODUODENOSCOPY (EGD);  Surgeon: [name];  Location: [facility];  Service: Endoscopy;  Laterality: [name];  9:45   ESOPHAGOGASTRODUODENOSCOPY (EGD) WITH ESOPHAGEAL DILATION  03/17/2012   [id number] appearing esophageal mucosa of uncertain significance. Status post [name] dilation followed [name] esophageal bx   MALONEY DILATION [name]  10/24/2013   Procedure: [name] DILATION;  Surgeon: [name];  Location: [facility];  Service: Endoscopy;  Laterality: [name];   MANDIBLE SURGERY     NECK SURGERY     X2, last time 2013   Social History   Socioeconomic History   Marital status: Married    Spouse name: Not on file   Number of children: Not on file   Years of education: Not on file   Highest education level: Not on file  Occupational History   Not on file  Tobacco Use   Smoking status: Never   Smokeless tobacco: Never  Vaping Use   Vaping Use: Never used  Substance and Sexual Activity   Alcohol use: No   Drug use: No   Sexual activity: Yes    Birth control/protection: Surgical  Other Topics Concern   Not on file  Social History Narrative   5 kids, raising grandchild   Social Determinants of Health   Financial Resource Strain: Not  on file  Food Insecurity: Not on file  Transportation Needs: Not on file  Physical Activity: Not on file  Stress: Not on file  Social Connections: Not on file   Outpatient Encounter Medications as of 09/23/2020  Medication Sig   Blood Glucose Monitoring Suppl (ACCU-CHEK GUIDE ME) w/Device KIT 1 Piece [name] Does not apply route as directed.   insulin isophane & regular human (NOVOLIN 70/30 FLEXPEN RELION) (70-30) 100 UNIT/ML KwikPen Inject 80 Units into the skin 2 (two) times daily before a meal.   [DISCONTINUED] glucose blood (ACCU-CHEK GUIDE) test strip Use as instructed   albuterol (VENTOLIN HFA) 108 (90 Base) MCG/ACT inhaler Inhale 1-2 puffs into the lungs every 6 (six) hours as needed for wheezing or shortness of breath.    baclofen (LIORESAL) 20 MG tablet Take 20 mg [name] mouth 3 (three) times daily.    blood glucose meter kit and supplies KIT Dispense based on patient and insurance preference. Use up to four times daily as directed. (FOR ICD-9 250.00, 250.01).   busPIRone (BUSPAR) 10 MG tablet Take 10 mg [name] mouth daily as needed. For anxiety   citalopram (CELEXA) 40 MG  tablet Take 40 mg [name] mouth every evening.    Continuous Blood Gluc Sensor (DEXCOM G6 SENSOR) MISC 4 Pieces [name] Does not apply route once a week.   Continuous Blood Gluc Transmit (DEXCOM G6 TRANSMITTER) MISC 1 Piece [name] Does not apply route as directed.   gabapentin (NEURONTIN) 800 MG tablet Take 800 mg [name] mouth 4 (four) times daily.   glipiZIDE (GLUCOTROL XL) 10 MG 24 hr tablet Take 1 tablet (10 mg total) [name] mouth daily with breakfast.   glucose blood (ACCU-CHEK GUIDE) test strip Use to test glucose 4 times daily   Insulin Pen Needle (B-D ULTRAFINE III SHORT PEN) 31G X 8 MM MISC USE AS DIRECTED TID   lisinopril (ZESTRIL) 5 MG tablet Take 1 tablet (5 mg total) [name] mouth daily.   metFORMIN (GLUCOPHAGE) 500 MG tablet Take 2 tablets (1,000 mg total) [name] mouth 2 (two) times daily.   metoprolol succinate (TOPROL-XL) 50 MG 24 hr tablet Take 1 tablet (50 mg total) [name] mouth daily. Take with or immediately following a meal.   ondansetron (ZOFRAN) 4 MG tablet Take 4 mg [name] mouth 4 (four) times daily as needed.   Oxycodone HCl 10 MG TABS Take 10 mg [name] mouth every 4 (four) hours as needed (for pain).    pravastatin (PRAVACHOL) 40 MG tablet Take 1 tablet (40 mg total) [name] mouth every evening.   Semaglutide, 1 MG/DOSE, (OZEMPIC, 1 MG/DOSE,) 4 MG/3ML SOPN Inject 1 mg into the skin once a week.   [DISCONTINUED] insulin regular human CONCENTRATED (HUMULIN R U-500 KWIKPEN) 500 UNIT/ML kwikpen Inject 80 Units into the skin 3 (three) times daily with meals.   No facility-administered encounter medications on file as of 09/23/2020.    ALLERGIES: Allergies  Allergen Reactions   Peanut-Containing Drug Products Anaphylaxis and Hives    Patient states she is allergic to all nuts   Shellfish Allergy Anaphylaxis   Gluten Meal    Latex Other (See Comments)    Reaction: blistering   Prednisone Hives and Other (See Comments)    Reaction: causes psychological issues    Nexium [Esomeprazole Magnesium] Hives   Tape Rash     Paper tape please    VACCINATION STATUS: Immunization History  Administered Date(s) Administered   Influenza Split 04/17/2012   Pneumococcal Polysaccharide-23 04/17/2012    Diabetes She  presents for her follow-up diabetic visit. She has type 2 diabetes mellitus. Onset time: She was diagnosed at approximate age of 22 years. Her disease course has been worsening. There are no hypoglycemic associated symptoms. Pertinent negatives for hypoglycemia include no confusion, pallor or seizures. Associated symptoms include fatigue, polydipsia and polyuria. Pertinent negatives for diabetes include no polyphagia. There are no hypoglycemic complications. Symptoms are worsening. There are no diabetic complications. Risk factors for coronary artery disease include diabetes mellitus, hypertension, obesity, sedentary lifestyle, dyslipidemia, post-menopausal and family history. Current diabetic treatment includes insulin injections. She is compliant with treatment most of the time. Her weight is decreasing steadily. She is following a generally unhealthy (she admits to consumption of large quantities of soda.) diet. When asked about meal planning, she reported none. She has not had a previous visit with a dietitian. She never participates in exercise. Her home blood glucose trend is increasing steadily. Her overall blood glucose range is >200 mg/dl. (She presents without her meter.  Reports persistent hyperglycemia.  Her point-of-care A1c is 12.3% increasing from 9.8%.  She has lost coverage for her Dexcom, lost coverage for her insulin U500.  She denies any hypoglycemia.     ) An ACE inhibitor/angiotensin II receptor blocker is being taken.  Hyperlipidemia This is a chronic problem. The current episode started more than 1 year ago. The problem is uncontrolled. Exacerbating diseases include diabetes and obesity. Pertinent negatives include no myalgias. Current antihyperlipidemic treatment includes statins. Risk factors  for coronary artery disease include dyslipidemia, diabetes mellitus, hypertension, obesity and a sedentary lifestyle.   Review of systems: Limited as above   Objective:    BP 110/78   Pulse 84   Ht _0  (1.651 m)   Wt 184 lb 6.4 oz (83.6 kg)   BMI 30.69 kg/m   Wt Readings from Last 3 Encounters:  09/23/20 184 lb 6.4 oz (83.6 kg)  06/26/20 191 lb 6.4 oz (86.8 kg)  04/05/20 180 lb (81.6 kg)     Diabetic Labs (most recent): Lab Results  Component Value Date   HGBA1C 12.3 (A) 09/23/2020   HGBA1C 9.8 (A) 06/26/2020   HGBA1C 7.9 (A) 03/27/2020     Lipid Panel ( most recent) Lipid Panel     Component Value Date/Time   CHOL 146 06/20/2020 1149   TRIG 106 06/20/2020 1149   HDL 47 06/20/2020 1149   CHOLHDL 3.1 06/20/2020 1149   CHOLHDL 3.3 03/12/2019 0432   VLDL 21 03/12/2019 0432   LDLCALC 80 06/20/2020 1149   LDLCALC 116 (H) 04/28/2017 1101     Lab Results  Component Value Date   TSH 0.303 (Odom) 06/20/2020   TSH 0.237 (Odom) 03/20/2020   TSH 0.267 (Odom) 12/20/2019   TSH 0.31 (Odom) 04/28/2017   TSH 0.667 04/16/2012   FREET4 1.17 06/20/2020   FREET4 1.04 03/20/2020   FREET4 1.34 12/20/2019   FREET4 1.1 04/28/2017       Assessment & Plan:   1. Uncontrolled type 2 diabetes mellitus with hyperglycemia complicated [name] CVA.  She presents without her meter.  Reports persistent hyperglycemia.  Her point-of-care A1c is 12.3% increasing from 9.8%.  She has lost coverage for her Dexcom, lost coverage for her insulin U500.  She denies any hypoglycemia.    -her diabetes is complicated [name] CVA, obesity/ sedentary life and Pattricia C Budzinski remains at extremely high risk for more acute and chronic complications which include CAD, CVA, CKD, retinopathy, and neuropathy. These are all discussed in detail with  the patient.  - I have counseled her on diet management and weight loss, [name] adopting a carbohydrate restricted/protein rich diet.  She admits to continued consumption of large quantities  of soda, which explains the extremely high insulin resistance in her case.  - she acknowledges that there is a room for improvement in her food and drink choices. - Suggestion is made for her to avoid simple carbohydrates  from her diet including Cakes, Sweet Desserts, Ice Cream, Soda (diet and regular), Sweet Tea, Candies, Chips, Cookies, Store Bought Juices, Alcohol in Excess of  1-2 drinks a day, Artificial Sweeteners,  Coffee Creamer, and "Sugar-free" Products, Lemonade. This will help patient to have more stable blood glucose profile and potentially avoid unintended weight gain.   - I encouraged her to switch to  unprocessed or minimally processed complex starch and increased protein intake (animal or plant source), fruits, and vegetables.  - she is advised to stick to a routine mealtimes to eat 3 meals  a day and avoid unnecessary snacks ( to snack only to correct hypoglycemia).   - I have approached her with the following individualized plan to manage diabetes and patient agrees:     She is at extremely  high risk for hypoglycemia from inadvertent use of insulin, especially if she decides to decrease her carbs consumption.  -She has lost coverage for insulin U500, and looking for her next options.  I discussed initiated Novolin 70/30 80 units daily with breakfast and 80 units daily with supper for P[place] blood glucose readings above 90 mg per DL.    -She also lost coverage for Dexcom.  I discussed and prescribed the Accu-Chek guide meter and testing supplies and advised to monitor blood glucose 4 times a day-before meals and at bedtime. -Once again, she is encouraged to call clinic for blood glucose levels less than 70 or above 200 mg /dl. -She is advised to continue Metformin 1000 mg p.o. twice daily. -She still has some assistance covering Ozempic.  She is advised to continue Ozempic 1 mg subcutaneously weekly.  -She is also advised to continue  glipizide 10 mg XL p.o. daily at  breakfast.   -She is advised to return in 2 weeks with her meter and logs for evaluation.   2) Lipids/HPL: Her most recent lipid panel showed improved LDL profile at 80, improving from 116.  She is advised to continue atorvastatin 20 mg p.o. nightly.  Side effects and precautions discussed with her.     3) hypertension:  -Her blood pressure is controlled to target.  She is advised to continue lisinopril 5 mg p.o. daily with breakfast.      4) weight management: Her BMI 30.6-candidate for modest weight loss.    Carbohydrate restrictions and exercise regimen discussed in detail with her.  Her repeat thyroid function tests recently showed mild subclinical hyperthyroidism, did not need antithyroid intervention.    She will have repeat TSH/free T4 along with her next blood work.  - I advised patient to maintain close follow up with Debbie School, Odom for primary care needs.    I spent 44 minutes in the care of the patient today including review of labs from [facility], Lipids, Thyroid Function, Hematology (current and previous including abstractions from other facilities); face-to-face time discussing  her blood glucose readings/logs, discussing hypoglycemia and hyperglycemia episodes and symptoms, medications doses, her options of short and long term treatment based on the latest standards of care / guidelines;  discussion about incorporating lifestyle medicine;  and documenting the encounter.    Please refer to Patient Instructions for Blood Glucose Monitoring and Insulin/Medications Dosing Guide"  in media tab for additional information. Please  also refer to " Patient Self Inventory" in the Media  tab for reviewed elements of pertinent patient history.  Lorita Kirt Boys participated in the discussions, expressed understanding, and voiced agreement with the above plans.  All questions were answered to her satisfaction. she is encouraged to contact clinic should she have any questions or concerns prior  to her return visit.   Follow up plan: - Return in about 2 weeks (around 10/07/2020) for F/U with Meter and Logs Only - no Labs.  Glade Lloyd, Odom [facility] [address] [place],  [address] Phone: [phone]  Fax: [phone]    09/23/2020, 5:23 PM  This note was partially dictated with voice recognition software. Similar sounding words can be transcribed inadequately or may not  be corrected upon review.

## 2020-09-23 NOTE — Patient Instructions (Signed)

## 2020-10-07 ENCOUNTER — Encounter: Payer: Self-pay | Admitting: Nurse Practitioner

## 2020-10-07 ENCOUNTER — Ambulatory Visit (INDEPENDENT_AMBULATORY_CARE_PROVIDER_SITE_OTHER): Payer: BC Managed Care – PPO | Admitting: Nurse Practitioner

## 2020-10-07 VITALS — BP 117/79 | HR 101 | Ht 65.0 in | Wt 185.0 lb

## 2020-10-07 DIAGNOSIS — E1159 Type 2 diabetes mellitus with other circulatory complications: Secondary | ICD-10-CM | POA: Diagnosis not present

## 2020-10-07 DIAGNOSIS — I10 Essential (primary) hypertension: Secondary | ICD-10-CM

## 2020-10-07 DIAGNOSIS — E782 Mixed hyperlipidemia: Secondary | ICD-10-CM

## 2020-10-07 MED ORDER — NOVOLIN 70/30 FLEXPEN RELION (70-30) 100 UNIT/ML ~~LOC~~ SUPN: 72.0000 [IU] | PEN_INJECTOR | Freq: Two times a day (BID) | SUBCUTANEOUS | 2 refills | Status: DC

## 2020-10-07 NOTE — Patient Instructions (Signed)

## 2020-10-07 NOTE — Progress Notes (Signed)
10/07/2020, 10:59 AM    Endocrinology follow-up note    Subjective:    Patient ID: [name], female    DOB: Jul 18, 1965.  [name] is being seen in follow-up for management of currently uncontrolled symptomatic 2 diabetes, hyperlipidemia, hypertension. PMD:   [employer], MD.   Past Medical History:  Diagnosis Date   Asthma    Chronic abdominal pain    Chronic neck pain    C4-6 fusion   CONSTIPATION 05/16/2009   Qualifier: Diagnosis of  By: Ronnald Ramp FNP-BC, Kandice L    Diabetes mellitus without complication ([place])    Dysphagia 02/23/2012   Fatty liver    GERD (gastroesophageal reflux disease)    Helicobacter pylori gastritis 2011   Hyperlipidemia    Hypertension    LIVER FUNCTION TESTS, ABNORMAL, HX OF 05/14/2009   Qualifier: Diagnosis of  By: Olena Heckle, [place]     Migraine headache    MIGRAINE, COMMON 05/14/2009   Qualifier: Diagnosis of  By: Stephan Minister     MS (multiple sclerosis) [facility])    Sleep apnea    Past Surgical History:  Procedure Laterality Date   ABDOMINAL HYSTERECTOMY     APPENDECTOMY     BACK SURGERY  03/07/2012   Nerve Stimulator   CHOLECYSTECTOMY     COLONOSCOPY  07/09/2009   RMR; normal rectum aside from anal canal hemorrhoids/scattered left-sided diverticula   ESOPHAGOGASTRODUODENOSCOPY  05/22/2009   RMR; normal /small HH   ESOPHAGOGASTRODUODENOSCOPY  2007   RMR: non-critical Schatzki's rin, non-maniuplated   ESOPHAGOGASTRODUODENOSCOPY N/A 10/24/2013   Procedure: ESOPHAGOGASTRODUODENOSCOPY (EGD);  Surgeon: Daneil Dolin, MD;  Location: [facility];  Service: Endoscopy;  Laterality: N/A;  9:45   ESOPHAGOGASTRODUODENOSCOPY (EGD) WITH ESOPHAGEAL DILATION  03/17/2012   DYJ:[id number] appearing esophageal mucosa of uncertain significance. Status post Venia Minks dilation followed by esophageal bx   MALONEY DILATION N/A  10/24/2013   Procedure: Venia Minks DILATION;  Surgeon: Daneil Dolin, MD;  Location: [facility];  Service: Endoscopy;  Laterality: N/A;   MANDIBLE SURGERY     NECK SURGERY     X2, last time 2013   Social History   Socioeconomic History   Marital status: Married    Spouse name: Not on file   Number of children: Not on file   Years of education: Not on file   Highest education level: Not on file  Occupational History   Not on file  Tobacco Use   Smoking status: Never   Smokeless tobacco: Never  Vaping Use   Vaping Use: Never used  Substance and Sexual Activity   Alcohol use: No   Drug use: No   Sexual activity: Yes    Birth control/protection: Surgical  Other Topics Concern   Not on file  Social History Narrative   5 kids, raising grandchild   Social Determinants of Health   Financial Resource Strain: Not  on file  Food Insecurity: Not on file  Transportation Needs: Not on file  Physical Activity: Not on file  Stress: Not on file  Social Connections: Not on file   Outpatient Encounter Medications as of 10/07/2020  Medication Sig   albuterol (VENTOLIN HFA) 108 (90 Base) MCG/ACT inhaler Inhale 1-2 puffs into the lungs every 6 (six) hours as needed for wheezing or shortness of breath.    baclofen (LIORESAL) 20 MG tablet Take 20 mg by mouth 3 (three) times daily.    blood glucose meter kit and supplies KIT Dispense based on patient and insurance preference. Use up to four times daily as directed. (FOR ICD-9 250.00, 250.01).   Blood Glucose Monitoring Suppl (ACCU-CHEK GUIDE ME) w/Device KIT 1 Piece by Does not apply route as directed.   busPIRone (BUSPAR) 10 MG tablet Take 10 mg by mouth daily as needed. For anxiety   citalopram (CELEXA) 40 MG tablet Take 40 mg by mouth every evening.    Continuous Blood Gluc Sensor (DEXCOM G6 SENSOR) MISC 4 Pieces by Does not apply route once a week.   Continuous Blood Gluc Transmit (DEXCOM G6 TRANSMITTER) MISC 1 Piece by Does not apply route  as directed.   gabapentin (NEURONTIN) 800 MG tablet Take 800 mg by mouth 4 (four) times daily.   glipiZIDE (GLUCOTROL XL) 10 MG 24 hr tablet Take 1 tablet (10 mg total) by mouth daily with breakfast.   glucose blood (ACCU-CHEK GUIDE) test strip Use to test glucose 4 times daily   insulin isophane & regular human (NOVOLIN 70/30 FLEXPEN RELION) (70-30) 100 UNIT/ML KwikPen Inject 72 Units into the skin 2 (two) times daily before a meal.   Insulin Pen Needle (B-D ULTRAFINE III SHORT PEN) 31G X 8 MM MISC USE AS DIRECTED TID   lisinopril (ZESTRIL) 5 MG tablet Take 1 tablet (5 mg total) by mouth daily.   metFORMIN (GLUCOPHAGE) 500 MG tablet Take 2 tablets (1,000 mg total) by mouth 2 (two) times daily.   metoprolol succinate (TOPROL-XL) 50 MG 24 hr tablet Take 1 tablet (50 mg total) by mouth daily. Take with or immediately following a meal.   ondansetron (ZOFRAN) 4 MG tablet Take 4 mg by mouth 4 (four) times daily as needed.   Oxycodone HCl 10 MG TABS Take 10 mg by mouth every 4 (four) hours as needed (for pain).    pravastatin (PRAVACHOL) 40 MG tablet Take 1 tablet (40 mg total) by mouth every evening.   Semaglutide, 1 MG/DOSE, (OZEMPIC, 1 MG/DOSE,) 4 MG/3ML SOPN Inject 1 mg into the skin once a week.   [DISCONTINUED] insulin isophane & regular human (NOVOLIN 70/30 FLEXPEN RELION) (70-30) 100 UNIT/ML KwikPen Inject 80 Units into the skin 2 (two) times daily before a meal.   No facility-administered encounter medications on file as of 10/07/2020.    ALLERGIES: Allergies  Allergen Reactions   Peanut-Containing Drug Products Anaphylaxis and Hives    Patient states she is allergic to all nuts   Shellfish Allergy Anaphylaxis   Gluten Meal    Latex Other (See Comments)    Reaction: blistering   Prednisone Hives and Other (See Comments)    Reaction: causes psychological issues    Nexium [Esomeprazole Magnesium] Hives   Tape Rash    Paper tape please    VACCINATION STATUS: Immunization History   Administered Date(s) Administered   Influenza Split 04/17/2012   Pneumococcal Polysaccharide-23 04/17/2012    Diabetes She presents for her follow-up diabetic visit. She has type  2 diabetes mellitus. Onset time: She was diagnosed at approximate age of 55 years. Her disease course has been improving. There are no hypoglycemic associated symptoms. Pertinent negatives for hypoglycemia include no confusion, pallor or seizures. Associated symptoms include fatigue, polydipsia and polyuria. Pertinent negatives for diabetes include no polyphagia. There are no hypoglycemic complications. Symptoms are improving. There are no diabetic complications. Risk factors for coronary artery disease include diabetes mellitus, hypertension, obesity, sedentary lifestyle, dyslipidemia, post-menopausal and family history. Current diabetic treatment includes insulin injections and oral agent (dual therapy). She is compliant with treatment most of the time. Her weight is fluctuating minimally. She is following a generally unhealthy diet. When asked about meal planning, she reported none. She has not had a previous visit with a dietitian. She never participates in exercise. Her home blood glucose trend is decreasing steadily. Her breakfast blood glucose range is generally 110-130 mg/dl. Her lunch blood glucose range is generally >200 mg/dl. Her dinner blood glucose range is generally 140-180 mg/dl. Her bedtime blood glucose range is generally 140-180 mg/dl. Her overall blood glucose range is >200 mg/dl. (She presents today with her meter and logs showing greatly improved glycemic profile overall.  It is not time to recheck her A1c.  She has missed some opportunities to inject her insulin due to glucose readings, but no true hypoglycemic episodes noted.  Analysis of her meter shows 7-day average of 166 with 25 readings; 14-day average of 182 with 45 readings.) An ACE inhibitor/angiotensin II receptor blocker is being taken. She does not  see a podiatrist.Eye exam is current.  Hyperlipidemia This is a chronic problem. The current episode started more than 1 year ago. The problem is uncontrolled. Exacerbating diseases include diabetes and obesity. Factors aggravating her hyperlipidemia include beta blockers. Pertinent negatives include no myalgias. Current antihyperlipidemic treatment includes statins. The current treatment provides mild improvement of lipids. There are no compliance problems.  Risk factors for coronary artery disease include dyslipidemia, diabetes mellitus, hypertension, obesity and a sedentary lifestyle.   Review of systems  Constitutional: + Minimally fluctuating body weight,  current Body mass index is 30.79 kg/m. , + fatigue-improving, no subjective hyperthermia, no subjective hypothermia Eyes: no blurry vision, no xerophthalmia ENT: no sore throat, no nodules palpated in throat, no dysphagia/odynophagia, no hoarseness Cardiovascular: no chest pain, no shortness of breath, no palpitations, no leg swelling Respiratory: no cough, no shortness of breath Gastrointestinal: no nausea/vomiting/diarrhea Musculoskeletal: no muscle/joint aches Skin: no rashes, no hyperemia Neurological: no tremors, no numbness, no tingling, no dizziness Psychiatric: no depression, no anxiety   Objective:    BP 117/79   Pulse (!) 101   Ht _0  (1.651 m)   Wt 185 lb (83.9 kg)   BMI 30.79 kg/m   Wt Readings from Last 3 Encounters:  10/07/20 185 lb (83.9 kg)  09/23/20 184 lb 6.4 oz (83.6 kg)  06/26/20 191 lb 6.4 oz (86.8 kg)    BP Readings from Last 3 Encounters:  10/07/20 117/79  09/23/20 110/78  06/26/20 116/72      Physical Exam- Limited  Constitutional:  Body mass index is 30.79 kg/m. , not in acute distress, normal state of mind Eyes:  EOMI, no exophthalmos Neck: Supple Cardiovascular: RRR, no murmurs, rubs, or gallops, no edema Respiratory: Adequate breathing efforts, no crackles, rales, rhonchi, or  wheezing Musculoskeletal: no gross deformities, strength intact in all four extremities, no gross restriction of joint movements Skin:  no rashes, no hyperemia Neurological: no tremor with outstretched hands  Foot exam:  No rashes, ulcers, cuts, calluses, onychodystrophy.  Good pulses bilat. Decreased sensation to 10 g monofilament bilat. Worse on left than right  Diabetic Labs (most recent): Lab Results  Component Value Date   HGBA1C 12.3 (A) 09/23/2020   HGBA1C 9.8 (A) 06/26/2020   HGBA1C 7.9 (A) 03/27/2020     Lipid Panel ( most recent) Lipid Panel     Component Value Date/Time   CHOL 146 06/20/2020 1149   TRIG 106 06/20/2020 1149   HDL 47 06/20/2020 1149   CHOLHDL 3.1 06/20/2020 1149   CHOLHDL 3.3 03/12/2019 0432   VLDL 21 03/12/2019 0432   LDLCALC 80 06/20/2020 1149   LDLCALC 116 (H) 04/28/2017 1101     Lab Results  Component Value Date   TSH 0.303 (L) 06/20/2020   TSH 0.237 (L) 03/20/2020   TSH 0.267 (L) 12/20/2019   TSH 0.31 (L) 04/28/2017   TSH 0.667 04/16/2012   FREET4 1.17 06/20/2020   FREET4 1.04 03/20/2020   FREET4 1.34 12/20/2019   FREET4 1.1 04/28/2017       Assessment & Plan:   1) 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  She presents today with her meter and logs showing greatly improved glycemic profile overall.  It is not time to recheck her A1c.  She has missed some opportunities to inject her insulin due to glucose readings, but no true hypoglycemic episodes noted.  Analysis of her meter shows 7-day average of 166 with 25 readings; 14-day average of 182 with 45 readings.  -her diabetes is complicated by CVA, obesity/ sedentary life and [name] remains at extremely high risk for more acute and chronic complications which include CAD, CVA, CKD, retinopathy, and neuropathy. These are all discussed in detail with the patient.  - Nutritional counseling repeated at each appointment due to patients tendency to  fall back in to old habits.  - The patient admits there is a room for improvement in their diet and drink choices. -  Suggestion is made for the patient to avoid simple carbohydrates from their diet including Cakes, Sweet Desserts / Pastries, Ice Cream, Soda (diet and regular), Sweet Tea, Candies, Chips, Cookies, Sweet Pastries, Store Bought Juices, Alcohol in Excess of 1-2 drinks a day, Artificial Sweeteners, Coffee Creamer, and "Sugar-free" Products. This will help patient to have stable blood glucose profile and potentially avoid unintended weight gain.   - I encouraged the patient to switch to unprocessed or minimally processed complex starch and increased protein intake (animal or plant source), fruits, and vegetables.   - Patient is advised to stick to a routine mealtimes to eat 3 meals a day and avoid unnecessary snacks (to snack only to correct hypoglycemia).  - I have approached her with the following individualized plan to manage diabetes and patient agrees:     She is at extremely  high risk for hypoglycemia from inadvertent use of insulin, especially if she decides to decrease her carbs consumption.  -Based on her improved glycemic profile, she is advised to lower her dose of Novolin 70/30 to 72 units BID with meals if glucose is above 90 and she is eating.  She can continue Ozempic 1 mg SQ weekly and Metformin 1000 mg po twice daily with meals and Glipizide 10 mg XL daily with breakfast.    -She is encouraged to continue monitoring blood glucose 4 times daily before meals and before bed, and to call the clinic if she has readings less than  70 or above 300 for 3 tests in a row.    2) Lipids/HPL:  Her most recent lipid panel  from 06/20/20 showed improved LDL profile at 80, improving from 116.  She is advised to continue Atorvastatin 20 mg p.o. nightly.  Side effects and precautions discussed with her.    3) Hypertension:  -Her blood pressure is controlled to target.  She is advised to  continue Lisinopril 5 mg p.o. daily with breakfast and Metoprolol 50 mg po daily.  4) Weight management:  Her Body mass index is 30.79 kg/m.-candidate for modest weight loss.  Carbohydrate restrictions and exercise regimen discussed in detail with her.    - I advised patient to maintain close follow up with [employer], MD for primary care needs.      I spent 30 minutes in the care of the patient today including review of labs from [place], Lipids, Thyroid Function, Hematology (current and previous including abstractions from other facilities); face-to-face time discussing  her blood glucose readings/logs, discussing hypoglycemia and hyperglycemia episodes and symptoms, medications doses, her options of short and long term treatment based on the latest standards of care / guidelines;  discussion about incorporating lifestyle medicine;  and documenting the encounter.    Please refer to Patient Instructions for Blood Glucose Monitoring and Insulin/Medications Dosing Guide"  in media tab for additional information. Please  also refer to " Patient Self Inventory" in the Media  tab for reviewed elements of pertinent patient history.  [name] participated in the discussions, expressed understanding, and voiced agreement with the above plans.  All questions were answered to her satisfaction. she is encouraged to contact clinic should she have any questions or concerns prior to her return visit.   Follow up plan: - Return in about 3 months (around 01/07/2021) for Diabetes F/U with A1c in office, No previsit labs, Bring meter and logs.  Rayetta Pigg, [facility] [address] [place], [place] [address] Phone: [phone] Fax: [phone]  10/07/2020, 10:59 AM

## 2020-11-04 ENCOUNTER — Other Ambulatory Visit (HOSPITAL_COMMUNITY): Payer: Self-pay | Admitting: Internal Medicine

## 2020-11-04 DIAGNOSIS — Z1231 Encounter for screening mammogram for malignant neoplasm of breast: Secondary | ICD-10-CM

## 2020-11-06 ENCOUNTER — Ambulatory Visit (HOSPITAL_COMMUNITY): Payer: BC Managed Care – PPO

## 2021-01-07 ENCOUNTER — Ambulatory Visit: Payer: BC Managed Care – PPO | Admitting: "Endocrinology

## 2021-01-15 ENCOUNTER — Ambulatory Visit: Payer: BC Managed Care – PPO | Admitting: "Endocrinology

## 2021-04-16 ENCOUNTER — Ambulatory Visit: Payer: BC Managed Care – PPO | Admitting: "Endocrinology

## 2021-04-16 ENCOUNTER — Other Ambulatory Visit: Payer: Self-pay

## 2021-04-16 ENCOUNTER — Encounter: Payer: Self-pay | Admitting: "Endocrinology

## 2021-04-16 VITALS — BP 120/82 | HR 108 | Ht 65.0 in | Wt 189.4 lb

## 2021-04-16 DIAGNOSIS — E782 Mixed hyperlipidemia: Secondary | ICD-10-CM | POA: Diagnosis not present

## 2021-04-16 DIAGNOSIS — E1159 Type 2 diabetes mellitus with other circulatory complications: Secondary | ICD-10-CM

## 2021-04-16 DIAGNOSIS — I10 Essential (primary) hypertension: Secondary | ICD-10-CM

## 2021-04-16 LAB — POCT GLYCOSYLATED HEMOGLOBIN (HGB A1C): HbA1c, POC (controlled diabetic range): 8.7 % — AB (ref 0.0–7.0)

## 2021-04-16 MED ORDER — NOVOLIN 70/30 FLEXPEN RELION (70-30) 100 UNIT/ML ~~LOC~~ SUPN: 80.0000 [IU] | PEN_INJECTOR | Freq: Two times a day (BID) | SUBCUTANEOUS | 2 refills | Status: DC

## 2021-04-16 MED ORDER — METFORMIN HCL 500 MG PO TABS: 1000.0000 mg | ORAL_TABLET | Freq: Two times a day (BID) | ORAL | 1 refills | Status: DC

## 2021-04-16 MED ORDER — GLIPIZIDE ER 5 MG PO TB24: 5.0000 mg | ORAL_TABLET | Freq: Every day | ORAL | 1 refills | Status: DC

## 2021-04-16 NOTE — Progress Notes (Signed)
04/16/2021, 5:30 PM    Endocrinology follow-up note    Subjective:    Patient ID: [name], female    DOB: [birth year].  [name] is being seen in follow-up for management of currently uncontrolled symptomatic 2 diabetes, hyperlipidemia, hypertension. PMD:   Redmond School, MD.   Past Medical History:  Diagnosis Date   Asthma    Chronic abdominal pain    Chronic neck pain    C4-6 fusion   CONSTIPATION 05/16/2009   Qualifier: Diagnosis of  By: Ronnald Ramp FNP-BC, Kandice L    Diabetes mellitus without complication ([place])    Dysphagia 02/23/2012   Fatty liver    GERD (gastroesophageal reflux disease)    Helicobacter pylori gastritis 2011   Hyperlipidemia    Hypertension    LIVER FUNCTION TESTS, ABNORMAL, HX OF 05/14/2009   Qualifier: Diagnosis of  By: Olena Heckle, [place]     Migraine headache    MIGRAINE, COMMON 05/14/2009   Qualifier: Diagnosis of  By: Stephan Minister     MS (multiple sclerosis) [facility])    Sleep apnea    Past Surgical History:  Procedure Laterality Date   ABDOMINAL HYSTERECTOMY     APPENDECTOMY     BACK SURGERY  03/07/2012   Nerve Stimulator   CHOLECYSTECTOMY     COLONOSCOPY  07/09/2009   RMR; normal rectum aside from anal canal hemorrhoids/scattered left-sided diverticula   ESOPHAGOGASTRODUODENOSCOPY  05/22/2009   RMR; normal /small HH   ESOPHAGOGASTRODUODENOSCOPY  2007   RMR: non-critical Schatzki's rin, non-maniuplated   ESOPHAGOGASTRODUODENOSCOPY N/A 10/24/2013   Procedure: ESOPHAGOGASTRODUODENOSCOPY (EGD);  Surgeon: Daneil Dolin, MD;  [facility];  Service: Endoscopy;  Laterality: N/A;  9:45   ESOPHAGOGASTRODUODENOSCOPY (EGD) WITH ESOPHAGEAL DILATION  03/17/2012   GPQ:DIYMEBRA appearing esophageal mucosa of uncertain significance. Status post Venia Minks dilation followed by esophageal bx   MALONEY DILATION N/A  10/24/2013   Procedure: Venia Minks DILATION;  Surgeon: Daneil Dolin, MD;  [facility];  Service: Endoscopy;  Laterality: N/A;   MANDIBLE SURGERY     NECK SURGERY     X2, last time 2013   Social History   Socioeconomic History   Marital status: Married    Spouse name: Not on file   Number of children: Not on file   Years of education: Not on file   Highest education level: Not on file  Occupational History   Not on file  Tobacco Use   Smoking status: Never   Smokeless tobacco: Never  Vaping Use   Vaping Use: Never used  Substance and Sexual Activity   Alcohol use: No   Drug use: No   Sexual activity: Yes    Birth control/protection: Surgical  Other Topics Concern   Not on file  Social History Narrative   5 kids, raising grandchild   Social Determinants of Health   Financial Resource Strain: Not  on file  Food Insecurity: Not on file  Transportation Needs: Not on file  Physical Activity: Not on file  Stress: Not on file  Social Connections: Not on file   Outpatient Encounter Medications as of 04/16/2021  Medication Sig   albuterol (VENTOLIN HFA) 108 (90 Base) MCG/ACT inhaler Inhale 1-2 puffs into the lungs every 6 (six) hours as needed for wheezing or shortness of breath.    baclofen (LIORESAL) 20 MG tablet Take 20 mg by mouth 3 (three) times daily.    blood glucose meter kit and supplies KIT Dispense based on patient and insurance preference. Use up to four times daily as directed. (FOR ICD-9 250.00, 250.01).   Blood Glucose Monitoring Suppl (ACCU-CHEK GUIDE ME) w/Device KIT 1 Piece by Does not apply route as directed.   busPIRone (BUSPAR) 10 MG tablet Take 10 mg by mouth daily as needed. For anxiety   citalopram (CELEXA) 40 MG tablet Take 40 mg by mouth every evening.    Continuous Blood Gluc Sensor (DEXCOM G6 SENSOR) MISC 4 Pieces by Does not apply route once a week.   Continuous Blood Gluc Transmit (DEXCOM G6 TRANSMITTER) MISC 1 Piece by Does not apply route  as directed.   gabapentin (NEURONTIN) 800 MG tablet Take 800 mg by mouth 4 (four) times daily.   glipiZIDE (GLUCOTROL XL) 5 MG 24 hr tablet Take 1 tablet (5 mg total) by mouth daily with breakfast.   glucose blood (ACCU-CHEK GUIDE) test strip Use to test glucose 4 times daily   insulin isophane & regular human KwikPen (NOVOLIN 70/30 KWIKPEN) (70-30) 100 UNIT/ML KwikPen Inject 80 Units into the skin 2 (two) times daily before a meal.   Insulin Pen Needle (B-D ULTRAFINE III SHORT PEN) 31G X 8 MM MISC USE AS DIRECTED TID   lisinopril (ZESTRIL) 5 MG tablet Take 1 tablet (5 mg total) by mouth daily.   metFORMIN (GLUCOPHAGE) 500 MG tablet Take 2 tablets (1,000 mg total) by mouth 2 (two) times daily.   metoprolol succinate (TOPROL-XL) 50 MG 24 hr tablet Take 1 tablet (50 mg total) by mouth daily. Take with or immediately following a meal.   ondansetron (ZOFRAN) 4 MG tablet Take 4 mg by mouth 4 (four) times daily as needed.   Oxycodone HCl 10 MG TABS Take 10 mg by mouth every 4 (four) hours as needed (for pain).    pravastatin (PRAVACHOL) 40 MG tablet Take 1 tablet (40 mg total) by mouth every evening.   Semaglutide, 1 MG/DOSE, (OZEMPIC, 1 MG/DOSE,) 4 MG/3ML SOPN Inject 1 mg into the skin once a week.   [DISCONTINUED] glipiZIDE (GLUCOTROL XL) 10 MG 24 hr tablet Take 1 tablet (10 mg total) by mouth daily with breakfast.   [DISCONTINUED] insulin isophane & regular human (NOVOLIN 70/30 FLEXPEN RELION) (70-30) 100 UNIT/ML KwikPen Inject 72 Units into the skin 2 (two) times daily before a meal.   [DISCONTINUED] metFORMIN (GLUCOPHAGE) 500 MG tablet Take 2 tablets (1,000 mg total) by mouth 2 (two) times daily.   No facility-administered encounter medications on file as of 04/16/2021.    ALLERGIES: Allergies  Allergen Reactions   Peanut-Containing Drug Products Anaphylaxis and Hives    Patient states she is allergic to all nuts   Shellfish Allergy Anaphylaxis   Gluten Meal    Latex Other (See Comments)     Reaction: blistering   Prednisone Hives and Other (See Comments)    Reaction: causes psychological issues    Nexium [Esomeprazole Magnesium] Hives   Tape  Rash    Paper tape please    VACCINATION STATUS: Immunization History  Administered Date(s) Administered   Influenza Split 04/17/2012   Pneumococcal Polysaccharide-23 04/17/2012    Diabetes She presents for her follow-up diabetic visit. She has type 2 diabetes mellitus. Onset time: She was diagnosed at approximate age of 67 years. Her disease course has been improving. There are no hypoglycemic associated symptoms. Pertinent negatives for hypoglycemia include no confusion, pallor or seizures. Associated symptoms include fatigue, polydipsia and polyuria. Pertinent negatives for diabetes include no polyphagia. There are no hypoglycemic complications. Symptoms are improving. There are no diabetic complications. Risk factors for coronary artery disease include diabetes mellitus, hypertension, obesity, sedentary lifestyle, dyslipidemia, post-menopausal and family history. Current diabetic treatment includes insulin injections and oral agent (dual therapy). She is compliant with treatment most of the time. Her weight is fluctuating minimally. She is following a generally unhealthy diet. When asked about meal planning, she reported none. She has not had a previous visit with a dietitian. She never participates in exercise. Her home blood glucose trend is decreasing steadily. Her breakfast blood glucose range is generally 180-200 mg/dl. Her lunch blood glucose range is generally 180-200 mg/dl. Her dinner blood glucose range is generally 180-200 mg/dl. Her bedtime blood glucose range is generally 180-200 mg/dl. Her overall blood glucose range is 180-200 mg/dl. ([name] presents with improved glycemic profile.  Her Dexcom CGM shows 13% time range, 86% above range.  She has no significant hypoglycemia.  Her point-of-care A1c is 8.7% improving from 12.3% during her  last visit.  ) An ACE inhibitor/angiotensin II receptor blocker is being taken. She does not see a podiatrist.Eye exam is current.  Hyperlipidemia This is a chronic problem. The current episode started more than 1 year ago. The problem is uncontrolled. Exacerbating diseases include diabetes and obesity. Factors aggravating her hyperlipidemia include beta blockers. Pertinent negatives include no myalgias. Current antihyperlipidemic treatment includes statins. The current treatment provides mild improvement of lipids. There are no compliance problems.  Risk factors for coronary artery disease include dyslipidemia, diabetes mellitus, hypertension, obesity and a sedentary lifestyle.   Review of systems  Constitutional: + Minimally fluctuating body weight,  current Body mass index is 31.52 kg/m. , + fatigue-improving, no subjective hyperthermia, no subjective hypothermia    Objective:    BP 120/82    Pulse (!) 108    Ht '5\' 5"'  (1.651 m)    Wt 189 lb 6.4 oz (85.9 kg)    BMI 31.52 kg/m   Wt Readings from Last 3 Encounters:  04/16/21 189 lb 6.4 oz (85.9 kg)  10/07/20 185 lb (83.9 kg)  09/23/20 184 lb 6.4 oz (83.6 kg)    BP Readings from Last 3 Encounters:  04/16/21 120/82  10/07/20 117/79  09/23/20 110/78      Physical Exam- Limited  Constitutional:  Body mass index is 31.52 kg/m. , not in acute distress, normal state of mind    Foot exam:  No rashes, ulcers, cuts, calluses, onychodystrophy.  Good pulses bilat. Decreased sensation to 10 g monofilament bilat. Worse on left than right  Diabetic Labs (most recent): Lab Results  Component Value Date   HGBA1C 8.7 (A) 04/16/2021   HGBA1C 12.3 (A) 09/23/2020   HGBA1C 9.8 (A) 06/26/2020     Lipid Panel ( most recent) Lipid Panel     Component Value Date/Time   CHOL 146 06/20/2020 1149   TRIG 106 06/20/2020 1149   HDL 47 06/20/2020 1149   CHOLHDL 3.1 06/20/2020  1149   CHOLHDL 3.3 03/12/2019 0432   VLDL 21 03/12/2019 0432    LDLCALC 80 06/20/2020 1149   LDLCALC 116 (H) 04/28/2017 1101     Lab Results  Component Value Date   TSH 0.303 (L) 06/20/2020   TSH 0.237 (L) 03/20/2020   TSH 0.267 (L) 12/20/2019   TSH 0.31 (L) 04/28/2017   TSH 0.667 04/16/2012   FREET4 1.17 06/20/2020   FREET4 1.04 03/20/2020   FREET4 1.34 12/20/2019   FREET4 1.1 04/28/2017       Assessment & Plan:   1) 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  [name] presents with improved glycemic profile.  Her Dexcom CGM shows 13% time range, 86% above range.  She has no significant hypoglycemia.  Her point-of-care A1c is 8.7% improving from 12.3% during her last visit.  Her average blood glucose is 239 for the last 14 days.  -her diabetes is complicated by CVA, obesity/ sedentary life and [name] remains at extremely high risk for more acute and chronic complications which include CAD, CVA, CKD, retinopathy, and neuropathy. These are all discussed in detail with the patient.  - Nutritional counseling repeated at each appointment due to patients tendency to fall back in to old habits.  - she acknowledges that there is a room for improvement in her food and drink choices. - Suggestion is made for her to avoid simple carbohydrates  from her diet including Cakes, Sweet Desserts, Ice Cream, Soda (diet and regular), Sweet Tea, Candies, Chips, Cookies, Store Bought Juices, Alcohol , Artificial Sweeteners,  Coffee Creamer, and "Sugar-free" Products, Lemonade. This will help patient to have more stable blood glucose profile and potentially avoid unintended weight gain.  The following Lifestyle Medicine recommendations according to [place]  [facility]) were discussed and and offered to patient and she  agrees to start the journey:  A. Whole Foods, Plant-Based Nutrition comprising of fruits and vegetables, plant-based proteins, whole-grain carbohydrates was discussed in detail with the patient.    A list for source of those nutrients were also provided to the patient.  Patient will use only water or unsweetened tea for hydration. B.  The need to stay away from risky substances including alcohol, smoking; obtaining 7 to 9 hours of restorative sleep, at least 150 minutes of moderate intensity exercise weekly, the importance of healthy social connections,  and stress management techniques were discussed. C.  A full color page of  Calorie density of various food groups per pound showing examples of each food groups was provided to the patient.    - Patient is advised to stick to a routine mealtimes to eat 3 meals a day and avoid unnecessary snacks (to snack only to correct hypoglycemia).  - I have approached her with the following individualized plan to manage diabetes and patient agrees:     She is at extremely  high risk for hypoglycemia from inadvertent use of insulin, especially if she decides to decrease her carbs consumption.  -Based on her presenting glycemic profile, she is advised to increase her Novolin 70/30 to 80 units twice daily  with meals if glucose is above 90 and she is eating.  She is advised to continue Ozempic 1 mg subcutaneously weekly along with metformin 5 1000 mg p.o. twice daily and lowered glipizide at 5 mg XL p.o. daily at breakfast.    -She is encouraged to continue monitoring blood glucose using her CGM.  And she is encouraged  to call the clinic if she has readings less than 70 or above 300 for 3 tests in a row.    2) Lipids/HPL:  Her most recent lipid panel  from 06/20/20 showed improved LDL profile at 80, improving from 116.  She is advised to continue atorvastatin 20 mg p.o. nightly.  She is counseled on side effects and precautions.     3) Hypertension:  -Her blood pressure is controlled to target.  She is advised to continue Lisinopril 5 mg p.o. daily with breakfast and Metoprolol 50 mg po daily.  4) Weight management:  Her Body mass index is 31.52  kg/m.-candidate for modest weight loss.  Carbohydrate restrictions and exercise regimen discussed in detail with her.  The above lifestyle nutrition will help with weight management.  - I advised patient to maintain close follow up with Redmond School, MD for primary care needs.   I spent 44 minutes in the care of the patient today including review of labs from [facility], Lipids, Thyroid Function, Hematology (current and previous including abstractions from other facilities); face-to-face time discussing  her blood glucose readings/logs, discussing hypoglycemia and hyperglycemia episodes and symptoms, medications doses, her options of short and long term treatment based on the latest standards of care / guidelines;  discussion about incorporating lifestyle medicine;  and documenting the encounter.    Please refer to Patient Instructions for Blood Glucose Monitoring and Insulin/Medications Dosing Guide"  in media tab for additional information. Please  also refer to " Patient Self Inventory" in the Media  tab for reviewed elements of pertinent patient history.  [name] participated in the discussions, expressed understanding, and voiced agreement with the above plans.  All questions were answered to her satisfaction. she is encouraged to contact clinic should she have any questions or concerns prior to her return visit.  Follow up plan: - Return in about 3 months (around 07/15/2021) for F/U with Pre-visit Labs, Meter, Logs, A1c here.Rayetta Pigg, FNP-BC [facility] [address] [place], [place] [address] Phone: [phone] Fax: [phone]  04/16/2021, 5:30 PM

## 2021-04-16 NOTE — Patient Instructions (Signed)

## 2021-06-27 ENCOUNTER — Other Ambulatory Visit: Payer: Self-pay | Admitting: "Endocrinology

## 2021-07-16 ENCOUNTER — Encounter: Payer: Self-pay | Admitting: "Endocrinology

## 2021-07-16 ENCOUNTER — Ambulatory Visit (INDEPENDENT_AMBULATORY_CARE_PROVIDER_SITE_OTHER): Payer: BC Managed Care – PPO | Admitting: "Endocrinology

## 2021-07-16 VITALS — BP 116/64 | HR 84 | Ht 65.0 in | Wt 195.0 lb

## 2021-07-16 DIAGNOSIS — I10 Essential (primary) hypertension: Secondary | ICD-10-CM | POA: Diagnosis not present

## 2021-07-16 DIAGNOSIS — E782 Mixed hyperlipidemia: Secondary | ICD-10-CM

## 2021-07-16 DIAGNOSIS — E1159 Type 2 diabetes mellitus with other circulatory complications: Secondary | ICD-10-CM | POA: Diagnosis not present

## 2021-07-16 DIAGNOSIS — Z6835 Body mass index (BMI) 35.0-35.9, adult: Secondary | ICD-10-CM

## 2021-07-16 LAB — COMPREHENSIVE METABOLIC PANEL
ALT: 24 IU/L (ref 0–32)
AST: 31 IU/L (ref 0–40)
Albumin/Globulin Ratio: 1 — ABNORMAL LOW (ref 1.2–2.2)
Albumin: 3.7 g/dL — ABNORMAL LOW (ref 3.8–4.9)
Alkaline Phosphatase: 116 IU/L (ref 44–121)
BUN/Creatinine Ratio: 19 (ref 9–23)
BUN: 15 mg/dL (ref 6–24)
Bilirubin Total: 0.3 mg/dL (ref 0.0–1.2)
CO2: 26 mmol/L (ref 20–29)
Calcium: 8.8 mg/dL (ref 8.7–10.2)
Chloride: 100 mmol/L (ref 96–106)
Creatinine, Ser: 0.78 mg/dL (ref 0.57–1.00)
Globulin, Total: 3.8 g/dL (ref 1.5–4.5)
Glucose: 237 mg/dL — ABNORMAL HIGH (ref 70–99)
Potassium: 4.2 mmol/L (ref 3.5–5.2)
Sodium: 138 mmol/L (ref 134–144)
Total Protein: 7.5 g/dL (ref 6.0–8.5)
eGFR: 90 mL/min/{1.73_m2} (ref >59)

## 2021-07-16 LAB — LIPID PANEL
Chol/HDL Ratio: 2.8 ratio (ref 0.0–4.4)
Cholesterol, Total: 166 mg/dL (ref 100–199)
HDL: 60 mg/dL (ref >39)
LDL Chol Calc (NIH): 84 mg/dL (ref 0–99)
Triglycerides: 123 mg/dL (ref 0–149)
VLDL Cholesterol Cal: 22 mg/dL (ref 5–40)

## 2021-07-16 LAB — TSH: TSH: 0.502 u[IU]/mL (ref 0.450–4.500)

## 2021-07-16 LAB — POCT GLYCOSYLATED HEMOGLOBIN (HGB A1C): HbA1c, POC (controlled diabetic range): 7.8 % — AB (ref 0.0–7.0)

## 2021-07-16 LAB — T4, FREE: Free T4: 1.15 ng/dL (ref 0.82–1.77)

## 2021-07-16 MED ORDER — SEMAGLUTIDE (2 MG/DOSE) 8 MG/3ML ~~LOC~~ SOPN: 2.0000 mg | PEN_INJECTOR | SUBCUTANEOUS | 2 refills | Status: DC

## 2021-07-16 MED ORDER — BD PEN NEEDLE SHORT U/F 31G X 8 MM MISC: 5 refills | Status: DC

## 2021-07-16 NOTE — Patient Instructions (Signed)

## 2021-07-16 NOTE — Progress Notes (Signed)
?                                     ?                                                              ?     07/16/2021, 6:13 PM ? ? ? ?Endocrinology follow-up note ? ? ? ?Subjective:  ? ? Patient ID: [name], female    DOB: [birth year].  ?[name] is being seen in follow-up for management of currently uncontrolled symptomatic 2 diabetes, hyperlipidemia, hypertension. ?PMD:   Redmond School, MD. ? ? ?Past Medical History:  ?Diagnosis Date  ? Asthma   ? Chronic abdominal pain   ? Chronic neck pain   ? C4-6 fusion  ? CONSTIPATION 05/16/2009  ? Qualifier: Diagnosis of  By: Ronnald Ramp FNP-BC, Kandice L   ? Diabetes mellitus without complication ([place])   ? Dysphagia 02/23/2012  ? Fatty liver   ? GERD (gastroesophageal reflux disease)   ? Helicobacter pylori gastritis 2011  ? Hyperlipidemia   ? Hypertension   ? LIVER FUNCTION TESTS, ABNORMAL, HX OF 05/14/2009  ? Qualifier: Diagnosis of  By: Stephan Minister    ? Migraine headache   ? MIGRAINE, COMMON 05/14/2009  ? Qualifier: Diagnosis of  By: Stephan Minister    ? MS (multiple sclerosis) ([place])   ? Sleep apnea   ? ?Past Surgical History:  ?Procedure Laterality Date  ? ABDOMINAL HYSTERECTOMY    ? APPENDECTOMY    ? BACK SURGERY  03/07/2012  ? Nerve Stimulator  ? CHOLECYSTECTOMY    ? COLONOSCOPY  07/09/2009  ? RMR; normal rectum aside from anal canal hemorrhoids/scattered left-sided diverticula  ? ESOPHAGOGASTRODUODENOSCOPY  05/22/2009  ? RMR; normal /small HH  ? ESOPHAGOGASTRODUODENOSCOPY  2007  ? RMR: non-critical Schatzki's rin, non-maniuplated  ? ESOPHAGOGASTRODUODENOSCOPY N/A 10/24/2013  ? Procedure: ESOPHAGOGASTRODUODENOSCOPY (EGD);  Surgeon: Daneil Dolin, MD;  [facility];  Service: Endoscopy;  Laterality: N/A;  9:45  ? ESOPHAGOGASTRODUODENOSCOPY (EGD) WITH ESOPHAGEAL DILATION  03/17/2012  ? [id number] appearing esophageal mucosa of uncertain significance. Status post Venia Minks dilation followed by esophageal bx  ? MALONEY DILATION N/A  10/24/2013  ? Procedure: MALONEY DILATION;  Surgeon: Daneil Dolin, MD;  [facility];  Service: Endoscopy;  Laterality: N/A;  ? MANDIBLE SURGERY    ? NECK SURGERY    ? X2, last time 2013  ? ?Social History  ? ?Socioeconomic History  ? Marital status: Married  ?  Spouse name: Not on file  ? Number of children: Not on file  ? Years of education: Not on file  ? Highest education level: Not on file  ?Occupational History  ? Not on file  ?Tobacco Use  ? Smoking status: Never  ? Smokeless tobacco: Never  ?Vaping Use  ? Vaping Use: Never used  ?Substance and Sexual Activity  ? Alcohol use: No  ? Drug use: No  ? Sexual activity: Yes  ?  Birth control/protection: Surgical  ?Other Topics Concern  ? Not on file  ?Social History Narrative  ? 5 kids, raising grandchild  ? ?Social Determinants of Health  ? ?Financial Resource Strain: Not  on file  ?Food Insecurity: Not on file  ?Transportation Needs: Not on file  ?Physical Activity: Not on file  ?Stress: Not on file  ?Social Connections: Not on file  ? ?Outpatient Encounter Medications as of 07/16/2021  ?Medication Sig  ? Semaglutide, 2 MG/DOSE, 8 MG/3ML SOPN Inject 2 mg as directed once a week.  ? albuterol (VENTOLIN HFA) 108 (90 Base) MCG/ACT inhaler Inhale 1-2 puffs into the lungs every 6 (six) hours as needed for wheezing or shortness of breath.   ? baclofen (LIORESAL) 20 MG tablet Take 20 mg by mouth 3 (three) times daily.   ? blood glucose meter kit and supplies KIT Dispense based on patient and insurance preference. Use up to four times daily as directed. (FOR ICD-9 250.00, 250.01).  ? Blood Glucose Monitoring Suppl (ACCU-CHEK GUIDE ME) w/Device KIT 1 Piece by Does not apply route as directed.  ? busPIRone (BUSPAR) 10 MG tablet Take 10 mg by mouth daily as needed. For anxiety  ? citalopram (CELEXA) 40 MG tablet Take 40 mg by mouth every evening.   ? Continuous Blood Gluc Sensor (DEXCOM G6 SENSOR) MISC 4 Pieces by Does not apply route once a week.  ? Continuous  Blood Gluc Transmit (DEXCOM G6 TRANSMITTER) MISC 1 Piece by Does not apply route as directed.  ? gabapentin (NEURONTIN) 800 MG tablet Take 800 mg by mouth 4 (four) times daily.  ? glipiZIDE (GLUCOTROL XL) 5 MG 24 hr tablet Take 1 tablet (5 mg total) by mouth daily with breakfast.  ? glucose blood (ACCU-CHEK GUIDE) test strip Use to test glucose 4 times daily  ? insulin isophane & regular human KwikPen (NOVOLIN 70/30 KWIKPEN) (70-30) 100 UNIT/ML KwikPen Inject 80 Units into the skin 2 (two) times daily before a meal.  ? Insulin Pen Needle (B-D ULTRAFINE III SHORT PEN) 31G X 8 MM MISC USE TO INJECT INSULIN 2 TIMES DAILY  ? lisinopril (ZESTRIL) 5 MG tablet TAKE ONE TABLET BY MOUTH ONCE DAILY.  ? metFORMIN (GLUCOPHAGE) 500 MG tablet Take 2 tablets (1,000 mg total) by mouth 2 (two) times daily.  ? metoprolol succinate (TOPROL-XL) 50 MG 24 hr tablet Take 1 tablet (50 mg total) by mouth daily. Take with or immediately following a meal.  ? ondansetron (ZOFRAN) 4 MG tablet Take 4 mg by mouth 4 (four) times daily as needed.  ? Oxycodone HCl 10 MG TABS Take 10 mg by mouth every 4 (four) hours as needed (for pain).   ? pravastatin (PRAVACHOL) 40 MG tablet Take 1 tablet (40 mg total) by mouth every evening.  ? [DISCONTINUED] Insulin Pen Needle (B-D ULTRAFINE III SHORT PEN) 31G X 8 MM MISC USE AS DIRECTED TID  ? [DISCONTINUED] Semaglutide, 1 MG/DOSE, (OZEMPIC, 1 MG/DOSE,) 4 MG/3ML SOPN Inject 1 mg into the skin once a week.  ? ?No facility-administered encounter medications on file as of 07/16/2021.  ? ? ?ALLERGIES: ?Allergies  ?Allergen Reactions  ? Peanut-Containing Drug Products Anaphylaxis and Hives  ?  Patient states she is allergic to all nuts  ? Shellfish Allergy Anaphylaxis  ? Gluten Meal   ? Latex Other (See Comments)  ?  Reaction: blistering  ? Prednisone Hives and Other (See Comments)  ?  Reaction: causes psychological issues   ? Nexium [Esomeprazole Magnesium] Hives  ? Tape Rash  ?  Paper tape please  ? ? ?VACCINATION  STATUS: ?Immunization History  ?Administered Date(s) Administered  ? Influenza Split 04/17/2012  ? Pneumococcal Polysaccharide-23 04/17/2012  ? ? ?Diabetes ?She  presents for her follow-up diabetic visit. She has type 2 diabetes mellitus. Onset time: She was diagnosed at approximate age of 38 years. Her disease course has been improving. There are no hypoglycemic associated symptoms. Pertinent negatives for hypoglycemia include no confusion, pallor or seizures. Associated symptoms include fatigue, polydipsia and polyuria. Pertinent negatives for diabetes include no polyphagia. There are no hypoglycemic complications. Symptoms are improving. There are no diabetic complications. Risk factors for coronary artery disease include diabetes mellitus, hypertension, obesity, sedentary lifestyle, dyslipidemia, post-menopausal and family history. Current diabetic treatment includes insulin injections and oral agent (dual therapy). She is compliant with treatment most of the time. Her weight is increasing steadily. She is following a generally unhealthy diet. When asked about meal planning, she reported none. She has not had a previous visit with a dietitian. She never participates in exercise. Her home blood glucose trend is decreasing steadily. Her breakfast blood glucose range is generally 180-200 mg/dl. Her lunch blood glucose range is generally 180-200 mg/dl. Her dinner blood glucose range is generally 180-200 mg/dl. Her bedtime blood glucose range is generally 180-200 mg/dl. Her overall blood glucose range is 180-200 mg/dl. ([name] presents with continued improvement in her glycemic profile.  Her CGM analysis shows 32% time range, 46% level 1 hyperglycemia, 21% level 2 hyperglycemia.  No hypoglycemia.  Her point-of-care A1c is 7.8% progressively improving from 12.3%.   ?) An ACE inhibitor/angiotensin II receptor blocker is being taken. She does not see a podiatrist.Eye exam is current.  ?Hyperlipidemia ?This is a chronic  problem. The current episode started more than 1 year ago. The problem is uncontrolled. Exacerbating diseases include diabetes and obesity. Factors aggravating her hyperlipidemia include beta blockers. Pertinent ne

## 2021-08-14 ENCOUNTER — Encounter (HOSPITAL_COMMUNITY): Payer: Self-pay

## 2021-08-14 ENCOUNTER — Other Ambulatory Visit: Payer: Self-pay

## 2021-08-14 ENCOUNTER — Emergency Department (HOSPITAL_COMMUNITY)
Admission: EM | Admit: 2021-08-14 | Discharge: 2021-08-14 | Disposition: A | Discharge status: Home / Self Care | Payer: BC Managed Care – PPO | Attending: Emergency Medicine | Admitting: Emergency Medicine

## 2021-08-14 DIAGNOSIS — E119 Type 2 diabetes mellitus without complications: Secondary | ICD-10-CM | POA: Diagnosis not present

## 2021-08-14 DIAGNOSIS — Z794 Long term (current) use of insulin: Secondary | ICD-10-CM | POA: Diagnosis not present

## 2021-08-14 DIAGNOSIS — Z7984 Long term (current) use of oral hypoglycemic drugs: Secondary | ICD-10-CM | POA: Insufficient documentation

## 2021-08-14 DIAGNOSIS — R42 Dizziness and giddiness: Secondary | ICD-10-CM | POA: Insufficient documentation

## 2021-08-14 DIAGNOSIS — Z91040 Latex allergy status: Secondary | ICD-10-CM | POA: Insufficient documentation

## 2021-08-14 DIAGNOSIS — I10 Essential (primary) hypertension: Secondary | ICD-10-CM | POA: Diagnosis not present

## 2021-08-14 DIAGNOSIS — Z79899 Other long term (current) drug therapy: Secondary | ICD-10-CM | POA: Insufficient documentation

## 2021-08-14 DIAGNOSIS — Z139 Encounter for screening, unspecified: Secondary | ICD-10-CM

## 2021-08-14 LAB — BASIC METABOLIC PANEL
Anion gap: 7 (ref 5–15)
BUN: 17 mg/dL (ref 6–20)
CO2: 25 mmol/L (ref 22–32)
Calcium: 8.7 mg/dL — ABNORMAL LOW (ref 8.9–10.3)
Chloride: 105 mmol/L (ref 98–111)
Creatinine, Ser: 0.66 mg/dL (ref 0.44–1.00)
GFR, Estimated: >60 mL/min (ref >60)
Glucose, Bld: 189 mg/dL — ABNORMAL HIGH (ref 70–99)
Potassium: 3.8 mmol/L (ref 3.5–5.1)
Sodium: 137 mmol/L (ref 135–145)

## 2021-08-14 LAB — CBG MONITORING, ED: Glucose-Capillary: 204 mg/dL — ABNORMAL HIGH (ref 70–99)

## 2021-08-14 NOTE — ED Provider Notes (Signed)
[facility] Provider Note   CSN: [id number] Arrival date & time: 08/14/21  1637     History  Chief Complaint  Patient presents with   Hypoglycemia    [name] is a 56 y.o. female.   Hypoglycemia Associated symptoms: no dizziness, no shortness of breath, no vomiting and no weakness       [name] is a 56 y.o. female with past medical history of type 2 diabetes, hypertension who presents to the Emergency Department requesting evaluation of possible hypoglycemia.  She has a continuous glucose monitoring system.  She replaced the sensor around noon today.  Since replacing the sensor, she is had multiple reports of blood sugars less than 100.  She ate lunch today and had a reading of 80 shortly after.  She had soda and crackers and blood sugars continue to read low.  States her blood sugar read as low as 61.  She complains of some mild lightheadedness but otherwise denies symptoms.  During triage, her monitor was noted to read 61 and CBG here was 204.  Home Medications Prior to Admission medications   Medication Sig Start Date End Date Taking? Authorizing Provider  albuterol (VENTOLIN HFA) 108 (90 Base) MCG/ACT inhaler Inhale 1-2 puffs into the lungs every 6 (six) hours as needed for wheezing or shortness of breath.  01/02/19   [provider]  baclofen (LIORESAL) 20 MG tablet Take 20 mg by mouth 3 (three) times daily.  05/08/14   [provider]  blood glucose meter kit and supplies KIT Dispense based on patient and insurance preference. Use up to four times daily as directed. (FOR ICD-9 250.00, 250.01). 03/15/19   Geradine Girt, DO  Blood Glucose Monitoring Suppl (ACCU-CHEK GUIDE ME) w/Device KIT 1 Piece by Does not apply route as directed. 09/23/20   Cassandria Anger, MD  busPIRone (BUSPAR) 10 MG tablet Take 10 mg by mouth daily as needed. For anxiety 08/29/18   [provider]  citalopram (CELEXA) 40 MG tablet Take 40 mg by mouth  every evening.     [provider]  Continuous Blood Gluc Sensor (DEXCOM G6 SENSOR) MISC 4 Pieces by Does not apply route once a week. 06/26/20   Cassandria Anger, MD  Continuous Blood Gluc Transmit (DEXCOM G6 TRANSMITTER) MISC 1 Piece by Does not apply route as directed. 06/26/20   Cassandria Anger, MD  gabapentin (NEURONTIN) 800 MG tablet Take 800 mg by mouth 4 (four) times daily. 01/02/19   [provider]  glipiZIDE (GLUCOTROL XL) 5 MG 24 hr tablet Take 1 tablet (5 mg total) by mouth daily with breakfast. 04/16/21   Cassandria Anger, MD  glucose blood (ACCU-CHEK GUIDE) test strip Use to test glucose 4 times daily 09/23/20   Cassandria Anger, MD  insulin isophane & regular human KwikPen (NOVOLIN 70/30 KWIKPEN) (70-30) 100 UNIT/ML KwikPen Inject 80 Units into the skin 2 (two) times daily before a meal. 04/16/21   Nida, Marella Chimes, MD  Insulin Pen Needle (B-D ULTRAFINE III SHORT PEN) 31G X 8 MM MISC USE TO INJECT INSULIN 2 TIMES DAILY 07/16/21   Nida, Marella Chimes, MD  lisinopril (ZESTRIL) 5 MG tablet TAKE ONE TABLET BY MOUTH ONCE DAILY. 06/27/21   Cassandria Anger, MD  metFORMIN (GLUCOPHAGE) 500 MG tablet Take 2 tablets (1,000 mg total) by mouth 2 (two) times daily. 04/16/21   Cassandria Anger, MD  metoprolol succinate (TOPROL-XL) 50 MG 24 hr tablet  Take 1 tablet (50 mg total) by mouth daily. Take with or immediately following a meal. 01/29/20 04/28/20  Imogene Burn, PA-C  ondansetron (ZOFRAN) 4 MG tablet Take 4 mg by mouth 4 (four) times daily as needed. 03/06/19   [provider]  Oxycodone HCl 10 MG TABS Take 10 mg by mouth every 4 (four) hours as needed (for pain).  01/03/19   [provider]  pravastatin (PRAVACHOL) 40 MG tablet Take 1 tablet (40 mg total) by mouth every evening. 03/15/19   Black, Lezlie Octave, NP  Semaglutide, 2 MG/DOSE, 8 MG/3ML SOPN Inject 2 mg as directed once a week. 07/16/21   Cassandria Anger, MD       Allergies    Peanut-containing drug products, Shellfish allergy, Gluten meal, Latex, Prednisone, Nexium [esomeprazole magnesium], and Tape    Review of Systems   Review of Systems  Constitutional:  Negative for appetite change and fever.  Respiratory:  Negative for shortness of breath.   Cardiovascular:  Negative for chest pain.  Gastrointestinal:  Negative for abdominal pain, nausea and vomiting.  Endocrine: Negative for polydipsia and polyuria.  Musculoskeletal:  Negative for arthralgias and myalgias.  Neurological:  Positive for light-headedness. Negative for dizziness, syncope, weakness and headaches.   Physical Exam Updated Vital Signs BP (!) 141/83   Pulse 97   Temp 97.9 F (36.6 C) (Oral)   Resp 17   Ht '5\' 5"'  (1.651 m)   Wt 88.5 kg   SpO2 99%   BMI 32.45 kg/m  Physical Exam Vitals and nursing note reviewed.  Constitutional:      General: She is not in acute distress.    Appearance: Normal appearance. She is not ill-appearing.  Cardiovascular:     Rate and Rhythm: Normal rate and regular rhythm.     Pulses: Normal pulses.  Pulmonary:     Effort: Pulmonary effort is normal.  Chest:     Chest wall: No tenderness.  Abdominal:     Palpations: Abdomen is soft.     Tenderness: There is no abdominal tenderness.  Skin:    General: Skin is warm.     Capillary Refill: Capillary refill takes less than 2 seconds.  Neurological:     General: No focal deficit present.     Mental Status: She is alert.     Sensory: No sensory deficit.     Motor: No weakness.    ED Results / Procedures / Treatments   Labs (all labs ordered are listed, but only abnormal results are displayed) Labs Reviewed  BASIC METABOLIC PANEL - Abnormal; Notable for the following components:      Result Value   Glucose, Bld 189 (*)    Calcium 8.7 (*)    All other components within normal limits  CBG MONITORING, ED - Abnormal; Notable for the following components:   Glucose-Capillary 204 (*)     All other components within normal limits   Initial CBG here of 204  EKG None  Radiology No results found.  Procedures Procedures    Medications Ordered in ED Medications - No data to display  ED Course/ Medical Decision Making/ A&P                           Medical Decision Making  Patient here with concerns of possible hypoglycemia.  Had several readings less than 100 on her continuous monitoring system.  Reading at 61 with CBG here at 204.  Low blood sugar readings after changing her sensor earlier today.  BMP with blood glucose of 189.  Anion gap of 7 and bicarb reassuring.  Low blood sugar readings felt to be secondary to faulty sensor.  I feel that patient is appropriate for discharge home.  We will have her change out her sensor and close follow-up with PCP.  She also has a glucometer at home that she can check her sugars with as well.        Final Clinical Impression(s) / ED Diagnoses Final diagnoses:  Encounter for medical screening examination    Rx / DC Orders ED Discharge Orders     None         Kem Parkinson, PA-C 08/14/21 1906    Sherwood Gambler, MD 08/15/21 1555

## 2021-08-14 NOTE — Discharge Instructions (Addendum)
You likely have a faulty glucose monitoring sensor.  I recommend the sensor be changed.  Your blood sugars here today have been greater than 150.  You may call the manufacturer and let them know that you have had a confirmed faulty sensor through the hospital.  They will likely send you a voucher to have this sensor replaced.  Please follow-up with your primary care provider for recheck.  You can check your blood sugars with your glucometer until you receive your replacement sensor.

## 2021-08-14 NOTE — ED Provider Triage Note (Signed)
Emergency Medicine Provider Triage Evaluation Note  [name] , a 56 y.o. female  was evaluated in triage.  Pt complains of low blood sugar readings today.  Patient has a continuous glucose monitoring device.  She changed the sensor around noon today.  She had ate a pizza and chips and shortly after had a reading of 80.  She drank soda, ate peanut butter crackers and drink orange juice monitor continue to read less than 100.  Readings went as low as 61.  On arrival here, CBG read 204 and her monitor read 61.  Patient states she feels somewhat lightheaded but otherwise no other symptoms.  Review of Systems  Positive: Lightheaded Negative: Chest pain, abdominal pain, shortness of breath, syncope, fever, chills, nausea or vomiting  Physical Exam  BP (!) 141/83   Pulse 97   Temp 97.9 F (36.6 C) (Oral)   Resp 17   Ht 5\' 5"  (1.651 m)   Wt 88.5 kg   SpO2 99%   BMI 32.45 kg/m  Gen:   Awake, no distress   Resp:  Normal effort lungs clear to auscultation bilaterally MSK:   Moves extremities without difficulty  Other:  Abdomen soft nontender patient does not appear acutely ill.  Medical Decision Making  Medically screening exam initiated at 5:51 PM.  Appropriate orders placed.  [name] was informed that the remainder of the evaluation will be completed by another provider, this initial triage assessment does not replace that evaluation, and the importance of remaining in the ED until their evaluation is complete.  Patient here for evaluation of possible hypoglycemia.  Has continuous insulin monitoring device, change sensor earlier today.  Blood sugars have been reading low since.  Possible malfunction of her glucose monitoring device.  She will need further evaluation in the emergency department.  She is agreeable to this plan.   Ernest Mallick, PA-C 08/14/21 1800

## 2021-08-14 NOTE — ED Triage Notes (Signed)
Pt reports that her blood sugar started dropping today.  Denies taking any insulin today or anti diabetic medication today.  Bgl 61 per pt machine.  Resp even and unlabored.  Skin warm and dry.  Bgl 204 in triage.   nad

## 2021-08-27 ENCOUNTER — Other Ambulatory Visit: Payer: Self-pay | Admitting: "Endocrinology

## 2021-10-15 ENCOUNTER — Ambulatory Visit (INDEPENDENT_AMBULATORY_CARE_PROVIDER_SITE_OTHER): Payer: BC Managed Care – PPO | Admitting: "Endocrinology

## 2021-10-15 ENCOUNTER — Encounter: Payer: Self-pay | Admitting: "Endocrinology

## 2021-10-15 VITALS — BP 112/70 | HR 96 | Ht 65.0 in | Wt 201.6 lb

## 2021-10-15 DIAGNOSIS — E782 Mixed hyperlipidemia: Secondary | ICD-10-CM

## 2021-10-15 DIAGNOSIS — E1159 Type 2 diabetes mellitus with other circulatory complications: Secondary | ICD-10-CM

## 2021-10-15 DIAGNOSIS — Z91199 Patient's noncompliance with other medical treatment and regimen due to unspecified reason: Secondary | ICD-10-CM

## 2021-10-15 DIAGNOSIS — I10 Essential (primary) hypertension: Secondary | ICD-10-CM

## 2021-10-15 DIAGNOSIS — Z6835 Body mass index (BMI) 35.0-35.9, adult: Secondary | ICD-10-CM

## 2021-10-15 LAB — POCT GLYCOSYLATED HEMOGLOBIN (HGB A1C): HbA1c, POC (controlled diabetic range): 7.8 % — AB (ref 0.0–7.0)

## 2021-10-15 MED ORDER — SEMAGLUTIDE (2 MG/DOSE) 8 MG/3ML ~~LOC~~ SOPN
2.0000 mg | PEN_INJECTOR | SUBCUTANEOUS | 2 refills | Status: DC
Start: 2021-10-15 — End: 2022-01-06

## 2021-10-15 MED ORDER — METFORMIN HCL 500 MG PO TABS: 1000.0000 mg | ORAL_TABLET | Freq: Two times a day (BID) | ORAL | 1 refills | Status: DC

## 2021-10-15 MED ORDER — NOVOLIN 70/30 FLEXPEN RELION (70-30) 100 UNIT/ML ~~LOC~~ SUPN: 60.0000 [IU] | PEN_INJECTOR | Freq: Two times a day (BID) | SUBCUTANEOUS | 2 refills | Status: DC

## 2021-10-15 MED ORDER — GLIPIZIDE ER 5 MG PO TB24: 5.0000 mg | ORAL_TABLET | Freq: Every day | ORAL | 1 refills | Status: DC

## 2021-10-15 NOTE — Patient Instructions (Signed)

## 2021-10-15 NOTE — Progress Notes (Signed)
10/15/2021, 5:05 PM    Endocrinology follow-up note    Subjective:    Patient ID: [name], female    DOB: 1965/04/14.  [name] is being seen in follow-up for management of currently uncontrolled symptomatic 2 diabetes, hyperlipidemia, hypertension. PMD:   [employer], MD.   Past Medical History:  Diagnosis Date   Asthma    Chronic abdominal pain    Chronic neck pain    C4-6 fusion   CONSTIPATION 05/16/2009   Qualifier: Diagnosis of  By: Ronnald Ramp FNP-BC, Kandice L    Diabetes mellitus without complication ([place])    Dysphagia 02/23/2012   Fatty liver    GERD (gastroesophageal reflux disease)    Helicobacter pylori gastritis 2011   Hyperlipidemia    Hypertension    LIVER FUNCTION TESTS, ABNORMAL, HX OF 05/14/2009   Qualifier: Diagnosis of  By: Olena Heckle, [place]     Migraine headache    MIGRAINE, COMMON 05/14/2009   Qualifier: Diagnosis of  By: Stephan Minister     MS (multiple sclerosis) [facility])    Sleep apnea    Past Surgical History:  Procedure Laterality Date   ABDOMINAL HYSTERECTOMY     APPENDECTOMY     BACK SURGERY  03/07/2012   Nerve Stimulator   CHOLECYSTECTOMY     COLONOSCOPY  07/09/2009   RMR; normal rectum aside from anal canal hemorrhoids/scattered left-sided diverticula   ESOPHAGOGASTRODUODENOSCOPY  05/22/2009   RMR; normal /small HH   ESOPHAGOGASTRODUODENOSCOPY  2007   RMR: non-critical Schatzki's rin, non-maniuplated   ESOPHAGOGASTRODUODENOSCOPY N/A 10/24/2013   Procedure: ESOPHAGOGASTRODUODENOSCOPY (EGD);  Surgeon: Daneil Dolin, MD;  Location: [facility];  Service: Endoscopy;  Laterality: N/A;  9:45   ESOPHAGOGASTRODUODENOSCOPY (EGD) WITH ESOPHAGEAL DILATION  03/17/2012   BJY:NWGNFAOZ appearing esophageal mucosa of uncertain significance. Status post Venia Minks dilation followed by esophageal bx   MALONEY DILATION N/A  10/24/2013   Procedure: Venia Minks DILATION;  Surgeon: Daneil Dolin, MD;  Location: [facility];  Service: Endoscopy;  Laterality: N/A;   MANDIBLE SURGERY     NECK SURGERY     X2, last time 2013   Social History   Socioeconomic History   Marital status: Married    Spouse name: Not on file   Number of children: Not on file   Years of education: Not on file   Highest education level: Not on file  Occupational History   Not on file  Tobacco Use   Smoking status: Never   Smokeless tobacco: Never  Vaping Use   Vaping Use: Never used  Substance and Sexual Activity   Alcohol use: No   Drug use: No   Sexual activity: Yes    Birth control/protection: Surgical  Other Topics Concern   Not on file  Social History Narrative   5 kids, raising grandchild   Social Determinants of Health   Financial Resource Strain: Not  on file  Food Insecurity: Not on file  Transportation Needs: Not on file  Physical Activity: Not on file  Stress: Not on file  Social Connections: Not on file   Outpatient Encounter Medications as of 10/15/2021  Medication Sig   albuterol (VENTOLIN HFA) 108 (90 Base) MCG/ACT inhaler Inhale 1-2 puffs into the lungs every 6 (six) hours as needed for wheezing or shortness of breath.    baclofen (LIORESAL) 20 MG tablet Take 20 mg by mouth 3 (three) times daily.    blood glucose meter kit and supplies KIT Dispense based on patient and insurance preference. Use up to four times daily as directed. (FOR ICD-9 250.00, 250.01).   Blood Glucose Monitoring Suppl (ACCU-CHEK GUIDE ME) w/Device KIT 1 Piece by Does not apply route as directed.   citalopram (CELEXA) 40 MG tablet Take 40 mg by mouth every evening.    gabapentin (NEURONTIN) 800 MG tablet Take 800 mg by mouth 4 (four) times daily.   glipiZIDE (GLUCOTROL XL) 5 MG 24 hr tablet Take 1 tablet (5 mg total) by mouth daily with breakfast.   glucose blood (ACCU-CHEK GUIDE) test strip Use to test glucose 4 times daily   insulin  isophane & regular human KwikPen (NOVOLIN 70/30 KWIKPEN) (70-30) 100 UNIT/ML KwikPen Inject 60 Units into the skin 2 (two) times daily before a meal.   Insulin Pen Needle (B-D ULTRAFINE III SHORT PEN) 31G X 8 MM MISC USE TO INJECT INSULIN 2 TIMES DAILY   lisinopril (ZESTRIL) 5 MG tablet TAKE ONE TABLET BY MOUTH ONCE DAILY.   metFORMIN (GLUCOPHAGE) 500 MG tablet Take 2 tablets (1,000 mg total) by mouth 2 (two) times daily.   metoprolol succinate (TOPROL-XL) 50 MG 24 hr tablet Take 1 tablet (50 mg total) by mouth daily. Take with or immediately following a meal.   ondansetron (ZOFRAN) 4 MG tablet Take 4 mg by mouth 4 (four) times daily as needed.   Oxycodone HCl 10 MG TABS Take 10 mg by mouth every 4 (four) hours as needed (for pain).    pravastatin (PRAVACHOL) 40 MG tablet Take 1 tablet (40 mg total) by mouth every evening.   Semaglutide, 2 MG/DOSE, 8 MG/3ML SOPN Inject 2 mg as directed once a week.   [DISCONTINUED] busPIRone (BUSPAR) 10 MG tablet Take 10 mg by mouth daily as needed. For anxiety   [DISCONTINUED] Continuous Blood Gluc Sensor (DEXCOM G6 SENSOR) MISC 4 Pieces by Does not apply route once a week.   [DISCONTINUED] Continuous Blood Gluc Transmit (DEXCOM G6 TRANSMITTER) MISC 1 Piece by Does not apply route as directed.   [DISCONTINUED] glipiZIDE (GLUCOTROL XL) 5 MG 24 hr tablet Take 1 tablet (5 mg total) by mouth daily with breakfast.   [DISCONTINUED] insulin isophane & regular human KwikPen (NOVOLIN 70/30 KWIKPEN) (70-30) 100 UNIT/ML KwikPen Inject 80 Units into the skin 2 (two) times daily before a meal.   [DISCONTINUED] metFORMIN (GLUCOPHAGE) 500 MG tablet Take 2 tablets (1,000 mg total) by mouth 2 (two) times daily.   [DISCONTINUED] Semaglutide, 2 MG/DOSE, 8 MG/3ML SOPN Inject 2 mg as directed once a week.   No facility-administered encounter medications on file as of 10/15/2021.    ALLERGIES: Allergies  Allergen Reactions   Peanut-Containing Drug Products Anaphylaxis and Hives     Patient states she is allergic to all nuts   Shellfish Allergy Anaphylaxis   Gluten Meal    Latex Other (See Comments)    Reaction: blistering   Prednisone Hives and Other (See Comments)  Reaction: causes psychological issues    Nexium [Esomeprazole Magnesium] Hives   Tape Rash    Paper tape please    VACCINATION STATUS: Immunization History  Administered Date(s) Administered   Influenza Split 04/17/2012   Pneumococcal Polysaccharide-23 04/17/2012    Diabetes She presents for her follow-up diabetic visit. She has type 2 diabetes mellitus. Onset time: She was diagnosed at approximate age of 19 years. Her disease course has been worsening. There are no hypoglycemic associated symptoms. Pertinent negatives for hypoglycemia include no confusion, pallor or seizures. Associated symptoms include fatigue, polydipsia and polyuria. Pertinent negatives for diabetes include no polyphagia. There are no hypoglycemic complications. Symptoms are worsening. There are no diabetic complications. Risk factors for coronary artery disease include diabetes mellitus, hypertension, obesity, sedentary lifestyle, dyslipidemia, post-menopausal and family history. Current diabetic treatment includes insulin injections and oral agent (dual therapy). She is compliant with treatment most of the time. Her weight is increasing steadily. She is following a generally unhealthy diet. When asked about meal planning, she reported none. She has not had a previous visit with a dietitian. She never participates in exercise. Her home blood glucose trend is increasing steadily. Her breakfast blood glucose range is generally 180-200 mg/dl. Her lunch blood glucose range is generally 180-200 mg/dl. Her dinner blood glucose range is generally 180-200 mg/dl. Her bedtime blood glucose range is generally 180-200 mg/dl. Her overall blood glucose range is 180-200 mg/dl. ([name] presents with loss of control of her glycemia, mainly due to withdrawal  of treatment with insulin for the last 2 weeks.  She ran out of her insulin and did not call for refill.  She is using a meter to check blood glucose this time as well as her CGM.  Her CGM is not connected properly.  Her average blood glucose is 200-250 mg per DL.  Her point-of-care A1c is 7.8% unchanged from her last visit A1c.   She did not document any hypoglycemia. ) An ACE inhibitor/angiotensin II receptor blocker is being taken. She does not see a podiatrist.Eye exam is current.  Hyperlipidemia This is a chronic problem. The current episode started more than 1 year ago. The problem is uncontrolled. Exacerbating diseases include diabetes and obesity. Factors aggravating her hyperlipidemia include beta blockers. Pertinent negatives include no myalgias. Current antihyperlipidemic treatment includes statins. The current treatment provides mild improvement of lipids. There are no compliance problems.  Risk factors for coronary artery disease include dyslipidemia, diabetes mellitus, hypertension, obesity and a sedentary lifestyle.    Review of systems  Constitutional: + Minimally fluctuating body weight,  current Body mass index is 33.55 kg/m. , + fatigue-improving, no subjective hyperthermia, no subjective hypothermia    Objective:    BP 112/70   Pulse 96   Ht _0  (1.651 m)   Wt 201 lb 9.6 oz (91.4 kg)   BMI 33.55 kg/m   Wt Readings from Last 3 Encounters:  10/15/21 201 lb 9.6 oz (91.4 kg)  08/14/21 195 lb (88.5 kg)  07/16/21 195 lb (88.5 kg)    BP Readings from Last 3 Encounters:  10/15/21 112/70  08/14/21 (!) 141/83  07/16/21 116/64      Physical Exam- Limited  Constitutional:  Body mass index is 33.55 kg/m. , not in acute distress, normal state of mind    Foot exam:  No rashes, ulcers, cuts, calluses, onychodystrophy.  Good pulses bilat. Decreased sensation to 10 g monofilament bilat. Worse on left than right  Diabetic Labs (most recent): Lab Results  Component  Value Date   HGBA1C 7.8 (A) 10/15/2021   HGBA1C 7.8 (A) 07/16/2021   HGBA1C 8.7 (A) 04/16/2021   MICROALBUR 150 06/26/2020   MICROALBUR 4.2 04/28/2017     Lipid Panel ( most recent) Lipid Panel     Component Value Date/Time   CHOL 166 07/15/2021 1100   TRIG 123 07/15/2021 1100   HDL 60 07/15/2021 1100   CHOLHDL 2.8 07/15/2021 1100   CHOLHDL 3.3 03/12/2019 0432   VLDL 21 03/12/2019 0432   LDLCALC 84 07/15/2021 1100   LDLCALC 116 (H) 04/28/2017 1101     Lab Results  Component Value Date   TSH 0.502 07/15/2021   TSH 0.303 (L) 06/20/2020   TSH 0.237 (L) 03/20/2020   TSH 0.267 (L) 12/20/2019   TSH 0.31 (L) 04/28/2017   TSH 0.667 04/16/2012   FREET4 1.15 07/15/2021   FREET4 1.17 06/20/2020   FREET4 1.04 03/20/2020   FREET4 1.34 12/20/2019   FREET4 1.1 04/28/2017       Assessment & Plan:   1) 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  [name] presents with loss of control of her glycemia, mainly due to withdrawal of treatment with insulin for the last 2 weeks.  She ran out of her insulin and did not call for refill.  She is using a meter to check blood glucose this time as well as her CGM.  Her CGM is not connected properly.  Her average blood glucose is 200-250 mg per DL.  Her point-of-care A1c is 7.8% unchanged from her last visit A1c.   She did not document any hypoglycemia.  -her diabetes is complicated by CVA, obesity/ sedentary life and [name] remains at extremely high risk for more acute and chronic complications which include CAD, CVA, CKD, retinopathy, and neuropathy. These are all discussed in detail with the patient.  - Nutritional counseling repeated at each appointment due to patients tendency to fall back in to old habits.  - she acknowledges that there is a room for improvement in her food and drink choices. - Suggestion is made for her to avoid simple carbohydrates  from her diet including Cakes, Sweet Desserts, Ice Cream,  Soda (diet and regular), Sweet Tea, Candies, Chips, Cookies, Store Bought Juices, Alcohol , Artificial Sweeteners,  Coffee Creamer, and "Sugar-free" Products, Lemonade. This will help patient to have more stable blood glucose profile and potentially avoid unintended weight gain.  The following Lifestyle Medicine recommendations according to [place]  [facility]) were discussed and and offered to patient and she  agrees to start the journey:  A. Whole Foods, Plant-Based Nutrition comprising of fruits and vegetables, plant-based proteins, whole-grain carbohydrates was discussed in detail with the patient.   A list for source of those nutrients were also provided to the patient.  Patient will use only water or unsweetened tea for hydration. B.  The need to stay away from risky substances including alcohol, smoking; obtaining 7 to 9 hours of restorative sleep, at least 150 minutes of moderate intensity exercise weekly, the importance of healthy social connections,  and stress management techniques were discussed. C.  A full color page of  Calorie density of various food groups per pound showing examples of each food groups was provided to the patient.    - Patient is advised to stick to a routine mealtimes to eat 3 meals a day and avoid unnecessary snacks (to snack only to correct hypoglycemia).  - I have approached  her with the following individualized plan to manage diabetes and patient agrees:     She is at extremely  high risk for hypoglycemia from inadvertent use of insulin, especially if she decides to decrease her carbs consumption.  -Based on her presentation with loss of control of diabetes, she will be resumed on her premixed insulin Novolin 70/30 60 units with breakfast and 60 units with supper  if glucose is above 90 and she is eating.   She is advised to continue Ozempic 2 mg subcutaneously weekly, advised to continue glipizide 5 mg p.o. daily at breakfast and  metformin 1000 mg p.o. twice daily.   -She is advised to continue to use her CGM continuously.  And she is encouraged to call the clinic if she has readings less than 70 or above 300 for 3 tests in a row.    2) Lipids/HPL:  Her most recent lipid panel  from 06/20/20 showed improved LDL profile at 80, improving from 116.  She is advised to continue atorvastatin 20 mg p.o. nightly.  She is counseled on side effects and precautions.     3) Hypertension:  -Her blood pressure is controlled to target.  She is advised to continue Lisinopril 5 mg p.o. daily with breakfast and Metoprolol 50 mg po daily.  4) Weight management:  Her Body mass index is 33.55 kg/m.-candidate for modest weight loss.  Carbohydrate restrictions and exercise regimen discussed in detail with her.  The above lifestyle nutrition will help with weight management.  - I advised patient to maintain close follow up with [employer], MD for primary care needs.  I spent 35 minutes in the care of the patient today including review of labs from [facility], Lipids, Thyroid Function, Hematology (current and previous including abstractions from other facilities); face-to-face time discussing  her blood glucose readings/logs, discussing hypoglycemia and hyperglycemia episodes and symptoms, medications doses, her options of short and long term treatment based on the latest standards of care / guidelines;  discussion about incorporating lifestyle medicine;  and documenting the encounter. Risk reduction counseling performed per USPSTF guidelines to reduce obesity and cardiovascular risk factors.     Please refer to Patient Instructions for Blood Glucose Monitoring and Insulin/Medications Dosing Guide"  in media tab for additional information. Please  also refer to " Patient Self Inventory" in the Media  tab for reviewed elements of pertinent patient history.  [name] participated in the discussions, expressed understanding, and voiced agreement  with the above plans.  All questions were answered to her satisfaction. she is encouraged to contact clinic should she have any questions or concerns prior to her return visit.   Follow up plan: - Return in about 3 months (around 01/15/2022) for F/U with Pre-visit Labs, Meter/CGM/Logs, A1c here.  Rayetta Pigg, [facility] [address] [place], [place] [address] Phone: [phone] Fax: [phone]  10/15/2021, 5:05 PM

## 2021-10-28 ENCOUNTER — Telehealth: Payer: Self-pay | Admitting: "Endocrinology

## 2021-10-28 DIAGNOSIS — E1159 Type 2 diabetes mellitus with other circulatory complications: Secondary | ICD-10-CM

## 2021-10-28 MED ORDER — DEXCOM G7 SENSOR MISC: 2 refills | Status: DC

## 2021-10-28 MED ORDER — NOVOLIN 70/30 FLEXPEN RELION (70-30) 100 UNIT/ML ~~LOC~~ SUPN: 60.0000 [IU] | PEN_INJECTOR | Freq: Two times a day (BID) | SUBCUTANEOUS | 2 refills | Status: DC

## 2021-10-28 MED ORDER — DEXCOM G7 RECEIVER DEVI: 0 refills | Status: AC

## 2021-10-28 NOTE — Telephone Encounter (Signed)
Pt was told her Novolin was going to be sent in. Can you send that in?  Receiver and sensor for the Dexcom G7 was suppose to be sent in as well.  She would like that sent to [facility] - [place] - [address] AT PORTAL TO REGISTERED [address]

## 2021-10-28 NOTE — Telephone Encounter (Signed)
Rxs sent

## 2021-10-31 ENCOUNTER — Ambulatory Visit (HOSPITAL_COMMUNITY)
Admission: RE | Admit: 2021-10-31 | Discharge: 2021-10-31 | Disposition: A | Discharge status: Home / Self Care | Payer: BC Managed Care – PPO | Source: Ambulatory Visit | Attending: Internal Medicine | Admitting: Internal Medicine

## 2021-10-31 ENCOUNTER — Other Ambulatory Visit (HOSPITAL_COMMUNITY): Payer: Self-pay | Admitting: Internal Medicine

## 2021-10-31 DIAGNOSIS — R051 Acute cough: Secondary | ICD-10-CM | POA: Diagnosis present

## 2021-11-28 ENCOUNTER — Emergency Department (HOSPITAL_COMMUNITY): Payer: BC Managed Care – PPO

## 2021-11-28 ENCOUNTER — Inpatient Hospital Stay (HOSPITAL_COMMUNITY)
Admission: EM | Admit: 2021-11-28 | Discharge: 2021-12-02 | DRG: 871 | Disposition: A | Discharge status: Home / Self Care | Payer: BC Managed Care – PPO | Attending: Internal Medicine | Admitting: Internal Medicine

## 2021-11-28 ENCOUNTER — Encounter (HOSPITAL_COMMUNITY): Payer: Self-pay | Admitting: *Deleted

## 2021-11-28 ENCOUNTER — Other Ambulatory Visit: Payer: Self-pay

## 2021-11-28 ENCOUNTER — Inpatient Hospital Stay (HOSPITAL_COMMUNITY): Payer: BC Managed Care – PPO

## 2021-11-28 DIAGNOSIS — Z888 Allergy status to other drugs, medicaments and biological substances status: Secondary | ICD-10-CM

## 2021-11-28 DIAGNOSIS — K760 Fatty (change of) liver, not elsewhere classified: Secondary | ICD-10-CM | POA: Diagnosis present

## 2021-11-28 DIAGNOSIS — Z91013 Allergy to seafood: Secondary | ICD-10-CM

## 2021-11-28 DIAGNOSIS — K5900 Constipation, unspecified: Secondary | ICD-10-CM | POA: Diagnosis present

## 2021-11-28 DIAGNOSIS — R7989 Other specified abnormal findings of blood chemistry: Secondary | ICD-10-CM

## 2021-11-28 DIAGNOSIS — N390 Urinary tract infection, site not specified: Secondary | ICD-10-CM | POA: Diagnosis present

## 2021-11-28 DIAGNOSIS — R296 Repeated falls: Secondary | ICD-10-CM | POA: Diagnosis present

## 2021-11-28 DIAGNOSIS — Z91010 Allergy to peanuts: Secondary | ICD-10-CM

## 2021-11-28 DIAGNOSIS — G9341 Metabolic encephalopathy: Secondary | ICD-10-CM | POA: Diagnosis present

## 2021-11-28 DIAGNOSIS — A4151 Sepsis due to Escherichia coli [E. coli]: Secondary | ICD-10-CM | POA: Diagnosis not present

## 2021-11-28 DIAGNOSIS — J45909 Unspecified asthma, uncomplicated: Secondary | ICD-10-CM | POA: Diagnosis present

## 2021-11-28 DIAGNOSIS — Z91018 Allergy to other foods: Secondary | ICD-10-CM

## 2021-11-28 DIAGNOSIS — I10 Essential (primary) hypertension: Secondary | ICD-10-CM | POA: Diagnosis present

## 2021-11-28 DIAGNOSIS — E1159 Type 2 diabetes mellitus with other circulatory complications: Secondary | ICD-10-CM | POA: Diagnosis not present

## 2021-11-28 DIAGNOSIS — E1165 Type 2 diabetes mellitus with hyperglycemia: Secondary | ICD-10-CM | POA: Diagnosis present

## 2021-11-28 DIAGNOSIS — A419 Sepsis, unspecified organism: Secondary | ICD-10-CM | POA: Diagnosis not present

## 2021-11-28 DIAGNOSIS — G35 Multiple sclerosis: Secondary | ICD-10-CM | POA: Diagnosis present

## 2021-11-28 DIAGNOSIS — R6520 Severe sepsis without septic shock: Secondary | ICD-10-CM | POA: Diagnosis present

## 2021-11-28 DIAGNOSIS — Z7984 Long term (current) use of oral hypoglycemic drugs: Secondary | ICD-10-CM | POA: Diagnosis not present

## 2021-11-28 DIAGNOSIS — E782 Mixed hyperlipidemia: Secondary | ICD-10-CM | POA: Diagnosis present

## 2021-11-28 DIAGNOSIS — N179 Acute kidney failure, unspecified: Secondary | ICD-10-CM | POA: Diagnosis present

## 2021-11-28 DIAGNOSIS — Z794 Long term (current) use of insulin: Secondary | ICD-10-CM

## 2021-11-28 DIAGNOSIS — Z79899 Other long term (current) drug therapy: Secondary | ICD-10-CM

## 2021-11-28 DIAGNOSIS — G8929 Other chronic pain: Secondary | ICD-10-CM | POA: Diagnosis present

## 2021-11-28 DIAGNOSIS — R7881 Bacteremia: Secondary | ICD-10-CM

## 2021-11-28 DIAGNOSIS — Z833 Family history of diabetes mellitus: Secondary | ICD-10-CM

## 2021-11-28 DIAGNOSIS — E876 Hypokalemia: Secondary | ICD-10-CM | POA: Diagnosis present

## 2021-11-28 DIAGNOSIS — K219 Gastro-esophageal reflux disease without esophagitis: Secondary | ICD-10-CM | POA: Diagnosis present

## 2021-11-28 DIAGNOSIS — G4730 Sleep apnea, unspecified: Secondary | ICD-10-CM | POA: Diagnosis present

## 2021-11-28 DIAGNOSIS — Z8673 Personal history of transient ischemic attack (TIA), and cerebral infarction without residual deficits: Secondary | ICD-10-CM

## 2021-11-28 DIAGNOSIS — Z91040 Latex allergy status: Secondary | ICD-10-CM | POA: Diagnosis not present

## 2021-11-28 DIAGNOSIS — E11649 Type 2 diabetes mellitus with hypoglycemia without coma: Secondary | ICD-10-CM | POA: Diagnosis not present

## 2021-11-28 DIAGNOSIS — Z809 Family history of malignant neoplasm, unspecified: Secondary | ICD-10-CM

## 2021-11-28 LAB — URINALYSIS, ROUTINE W REFLEX MICROSCOPIC
Bilirubin Urine: NEGATIVE
Glucose, UA: NEGATIVE mg/dL
Ketones, ur: NEGATIVE mg/dL
Nitrite: NEGATIVE
Protein, ur: 30 mg/dL — AB
Specific Gravity, Urine: 1.012 (ref 1.005–1.030)
WBC, UA: >50 WBC/hpf — ABNORMAL HIGH (ref 0–5)
pH: 5 (ref 5.0–8.0)

## 2021-11-28 LAB — COMPREHENSIVE METABOLIC PANEL
ALT: 23 U/L (ref 0–44)
AST: 39 U/L (ref 15–41)
Albumin: 3 g/dL — ABNORMAL LOW (ref 3.5–5.0)
Alkaline Phosphatase: 129 U/L — ABNORMAL HIGH (ref 38–126)
Anion gap: 6 (ref 5–15)
BUN: 33 mg/dL — ABNORMAL HIGH (ref 6–20)
CO2: 27 mmol/L (ref 22–32)
Calcium: 8.2 mg/dL — ABNORMAL LOW (ref 8.9–10.3)
Chloride: 104 mmol/L (ref 98–111)
Creatinine, Ser: 2.05 mg/dL — ABNORMAL HIGH (ref 0.44–1.00)
GFR, Estimated: 28 mL/min — ABNORMAL LOW (ref >60)
Glucose, Bld: 219 mg/dL — ABNORMAL HIGH (ref 70–99)
Potassium: 3.4 mmol/L — ABNORMAL LOW (ref 3.5–5.1)
Sodium: 137 mmol/L (ref 135–145)
Total Bilirubin: 0.6 mg/dL (ref 0.3–1.2)
Total Protein: 7.8 g/dL (ref 6.5–8.1)

## 2021-11-28 LAB — CBC
HCT: 34.7 % — ABNORMAL LOW (ref 36.0–46.0)
Hemoglobin: 10.6 g/dL — ABNORMAL LOW (ref 12.0–15.0)
MCH: 24.3 pg — ABNORMAL LOW (ref 26.0–34.0)
MCHC: 30.5 g/dL (ref 30.0–36.0)
MCV: 79.6 fL — ABNORMAL LOW (ref 80.0–100.0)
Platelets: 230 10*3/uL (ref 150–400)
RBC: 4.36 MIL/uL (ref 3.87–5.11)
RDW: 14.6 % (ref 11.5–15.5)
WBC: 21.5 10*3/uL — ABNORMAL HIGH (ref 4.0–10.5)
nRBC: 0 % (ref 0.0–0.2)

## 2021-11-28 LAB — HEMOGLOBIN A1C
Hgb A1c MFr Bld: 7 % — ABNORMAL HIGH (ref 4.8–5.6)
Mean Plasma Glucose: 154.2 mg/dL

## 2021-11-28 LAB — LACTIC ACID, PLASMA
Lactic Acid, Venous: 2 mmol/L (ref 0.5–1.9)
Lactic Acid, Venous: 1.6 mmol/L (ref 0.5–1.9)

## 2021-11-28 LAB — MRSA NEXT GEN BY PCR, NASAL: MRSA by PCR Next Gen: DETECTED — AB

## 2021-11-28 LAB — PROTIME-INR
INR: 1.2 (ref 0.8–1.2)
Prothrombin Time: 15.4 seconds — ABNORMAL HIGH (ref 11.4–15.2)

## 2021-11-28 LAB — GLUCOSE, CAPILLARY
Glucose-Capillary: 117 mg/dL — ABNORMAL HIGH (ref 70–99)
Glucose-Capillary: 161 mg/dL — ABNORMAL HIGH (ref 70–99)
Glucose-Capillary: 46 mg/dL — ABNORMAL LOW (ref 70–99)
Glucose-Capillary: 60 mg/dL — ABNORMAL LOW (ref 70–99)
Glucose-Capillary: 82 mg/dL (ref 70–99)

## 2021-11-28 LAB — APTT: aPTT: 30 seconds (ref 24–36)

## 2021-11-28 LAB — CBG MONITORING, ED: Glucose-Capillary: 218 mg/dL — ABNORMAL HIGH (ref 70–99)

## 2021-11-28 MED ORDER — SODIUM CHLORIDE 0.9 % IV SOLN: 1.0000 g | Freq: Once | INTRAVENOUS | Status: DC

## 2021-11-28 MED ORDER — ACETAMINOPHEN 500 MG PO TABS
1000.0000 mg | ORAL_TABLET | Freq: Once | ORAL | Status: AC
  Administered 2021-11-28: 1000 mg via ORAL
  Filled 2021-11-28: qty 2

## 2021-11-28 MED ORDER — LACTATED RINGERS IV SOLN: INTRAVENOUS | Status: AC

## 2021-11-28 MED ORDER — POTASSIUM CHLORIDE 10 MEQ/100ML IV SOLN
10.0000 meq | INTRAVENOUS | Status: AC
  Filled 2021-11-28: qty 100

## 2021-11-28 MED ORDER — INSULIN GLARGINE-YFGN 100 UNIT/ML ~~LOC~~ SOLN
10.0000 [IU] | Freq: Two times a day (BID) | SUBCUTANEOUS | Status: DC
  Filled 2021-11-28: qty 0.1

## 2021-11-28 MED ORDER — METOPROLOL TARTRATE 25 MG PO TABS
25.0000 mg | ORAL_TABLET | Freq: Two times a day (BID) | ORAL | Status: DC
  Administered 2021-11-28 – 2021-12-02 (×8): 25 mg via ORAL
  Filled 2021-11-28 (×8): qty 1

## 2021-11-28 MED ORDER — INSULIN GLARGINE-YFGN 100 UNIT/ML ~~LOC~~ SOLN
10.0000 [IU] | Freq: Every day | SUBCUTANEOUS | Status: DC
Start: 2021-11-29 — End: 2021-12-02
  Administered 2021-11-30 – 2021-12-02 (×3): 10 [IU] via SUBCUTANEOUS
  Filled 2021-11-28 (×5): qty 0.1

## 2021-11-28 MED ORDER — ACETAMINOPHEN 325 MG PO TABS
650.0000 mg | ORAL_TABLET | Freq: Four times a day (QID) | ORAL | Status: DC | PRN
  Administered 2021-11-28 – 2021-12-01 (×5): 650 mg via ORAL
  Filled 2021-11-28 (×6): qty 2

## 2021-11-28 MED ORDER — SODIUM CHLORIDE 0.9 % IV SOLN
2.0000 g | INTRAVENOUS | Status: DC
  Administered 2021-11-28: 2 g via INTRAVENOUS
  Filled 2021-11-28: qty 20

## 2021-11-28 MED ORDER — ACETAMINOPHEN 650 MG RE SUPP: 650.0000 mg | Freq: Four times a day (QID) | RECTAL | Status: DC | PRN

## 2021-11-28 MED ORDER — METRONIDAZOLE 500 MG/100ML IV SOLN
500.0000 mg | Freq: Two times a day (BID) | INTRAVENOUS | Status: DC
  Administered 2021-11-28 – 2021-11-29 (×2): 500 mg via INTRAVENOUS
  Filled 2021-11-28 (×2): qty 100

## 2021-11-28 MED ORDER — INSULIN ASPART 100 UNIT/ML IJ SOLN
0.0000 [IU] | INTRAMUSCULAR | Status: DC
  Administered 2021-11-29: 2 [IU] via SUBCUTANEOUS
  Administered 2021-11-30 (×2): 1 [IU] via SUBCUTANEOUS
  Administered 2021-11-30: 2 [IU] via SUBCUTANEOUS
  Administered 2021-12-01 (×4): 1 [IU] via SUBCUTANEOUS

## 2021-11-28 MED ORDER — LACTATED RINGERS IV BOLUS (SEPSIS)
1000.0000 mL | Freq: Once | INTRAVENOUS | Status: AC
  Administered 2021-11-28: 1000 mL via INTRAVENOUS

## 2021-11-28 MED ORDER — DEXTROSE 10 % IV SOLN: INTRAVENOUS | Status: DC

## 2021-11-28 MED ORDER — IBUPROFEN 800 MG PO TABS
800.0000 mg | ORAL_TABLET | Freq: Once | ORAL | Status: AC
  Administered 2021-11-28: 800 mg via ORAL
  Filled 2021-11-28: qty 1

## 2021-11-28 MED ORDER — LACTATED RINGERS IV BOLUS (SEPSIS)
500.0000 mL | Freq: Once | INTRAVENOUS | Status: AC
  Administered 2021-11-28: 500 mL via INTRAVENOUS

## 2021-11-28 MED ORDER — OXYCODONE HCL 5 MG PO TABS
10.0000 mg | ORAL_TABLET | Freq: Once | ORAL | Status: AC
  Administered 2021-11-28: 10 mg via ORAL
  Filled 2021-11-28: qty 2

## 2021-11-28 MED ORDER — POTASSIUM CHLORIDE CRYS ER 20 MEQ PO TBCR
20.0000 meq | EXTENDED_RELEASE_TABLET | Freq: Once | ORAL | Status: AC
  Administered 2021-11-28: 20 meq via ORAL
  Filled 2021-11-28: qty 1

## 2021-11-28 MED ORDER — FAMOTIDINE IN NACL 20-0.9 MG/50ML-% IV SOLN
20.0000 mg | Freq: Two times a day (BID) | INTRAVENOUS | Status: DC
Start: 2021-11-28 — End: 2021-11-28

## 2021-11-28 MED ORDER — SODIUM CHLORIDE 0.9 % IV SOLN
2.0000 g | Freq: Once | INTRAVENOUS | Status: AC
  Administered 2021-11-28: 2 g via INTRAVENOUS
  Filled 2021-11-28: qty 12.5

## 2021-11-28 MED ORDER — SODIUM CHLORIDE 0.9 % IV SOLN
2.0000 g | Freq: Two times a day (BID) | INTRAVENOUS | Status: DC
  Administered 2021-11-29 – 2021-12-01 (×5): 2 g via INTRAVENOUS
  Filled 2021-11-28 (×5): qty 12.5

## 2021-11-28 MED ORDER — HYDRALAZINE HCL 20 MG/ML IJ SOLN
5.0000 mg | Freq: Four times a day (QID) | INTRAMUSCULAR | Status: DC | PRN
  Administered 2021-11-30 – 2021-12-01 (×2): 5 mg via INTRAVENOUS
  Filled 2021-11-28 (×2): qty 1

## 2021-11-28 MED ORDER — HEPARIN SODIUM (PORCINE) 5000 UNIT/ML IJ SOLN
5000.0000 [IU] | Freq: Three times a day (TID) | INTRAMUSCULAR | Status: DC
  Administered 2021-11-28 – 2021-12-02 (×11): 5000 [IU] via SUBCUTANEOUS
  Filled 2021-11-28 (×11): qty 1

## 2021-11-28 MED ORDER — VANCOMYCIN HCL 1500 MG/300ML IV SOLN
1500.0000 mg | Freq: Once | INTRAVENOUS | Status: AC
Start: 2021-11-28 — End: 2021-11-29
  Administered 2021-11-28: 1500 mg via INTRAVENOUS
  Filled 2021-11-28: qty 300

## 2021-11-28 MED ORDER — POTASSIUM CHLORIDE 10 MEQ/100ML IV SOLN
10.0000 meq | INTRAVENOUS | Status: AC
  Administered 2021-11-28 (×2): 10 meq via INTRAVENOUS
  Filled 2021-11-28: qty 100

## 2021-11-28 MED ORDER — VANCOMYCIN HCL IN DEXTROSE 1-5 GM/200ML-% IV SOLN: 1000.0000 mg | Freq: Once | INTRAVENOUS | Status: DC

## 2021-11-28 MED ORDER — ONDANSETRON HCL 4 MG PO TABS
4.0000 mg | ORAL_TABLET | Freq: Four times a day (QID) | ORAL | Status: DC | PRN
  Administered 2021-12-01 – 2021-12-02 (×3): 4 mg via ORAL
  Filled 2021-11-28 (×3): qty 1

## 2021-11-28 MED ORDER — MUPIROCIN 2 % EX OINT
1.0000 | TOPICAL_OINTMENT | Freq: Two times a day (BID) | CUTANEOUS | Status: DC
  Administered 2021-11-28 – 2021-12-02 (×8): 1 via NASAL
  Filled 2021-11-28 (×2): qty 22

## 2021-11-28 MED ORDER — SODIUM CHLORIDE 0.9 % IV BOLUS
1000.0000 mL | Freq: Once | INTRAVENOUS | Status: AC
Start: 2021-11-28 — End: 2021-11-28
  Administered 2021-11-28: 1000 mL via INTRAVENOUS

## 2021-11-28 MED ORDER — DEXTROSE 50 % IV SOLN
50.0000 mL | Freq: Once | INTRAVENOUS | Status: AC
  Administered 2021-11-28: 50 mL via INTRAVENOUS

## 2021-11-28 MED ORDER — CHLORHEXIDINE GLUCONATE CLOTH 2 % EX PADS
6.0000 | MEDICATED_PAD | Freq: Every day | CUTANEOUS | Status: DC
  Administered 2021-11-28 – 2021-11-30 (×3): 6 via TOPICAL

## 2021-11-28 MED ORDER — FAMOTIDINE IN NACL 20-0.9 MG/50ML-% IV SOLN
20.0000 mg | INTRAVENOUS | Status: DC
Start: 2021-11-28 — End: 2021-12-02
  Administered 2021-11-28 – 2021-12-01 (×4): 20 mg via INTRAVENOUS
  Filled 2021-11-28 (×4): qty 50

## 2021-11-28 MED ORDER — INSULIN GLARGINE-YFGN 100 UNIT/ML ~~LOC~~ SOLN
15.0000 [IU] | Freq: Two times a day (BID) | SUBCUTANEOUS | Status: DC
  Filled 2021-11-28 (×2): qty 0.15

## 2021-11-28 MED ORDER — VANCOMYCIN HCL 1500 MG/300ML IV SOLN: 1500.0000 mg | INTRAVENOUS | Status: DC

## 2021-11-28 MED ORDER — ONDANSETRON HCL 4 MG/2ML IJ SOLN
4.0000 mg | Freq: Four times a day (QID) | INTRAMUSCULAR | Status: DC | PRN
  Administered 2021-11-29 – 2021-12-01 (×5): 4 mg via INTRAVENOUS
  Filled 2021-11-28 (×6): qty 2

## 2021-11-28 NOTE — ED Provider Triage Note (Signed)
Emergency Medicine Provider Triage Evaluation Note  [name] , a 56 y.o. female  was evaluated in triage.  Pt complains of multiple falls over the past 2 days.  Patient states that she has had multiple episodes of feeling as though she is going to pass out, and believes that she has.  Husband states that she fell yesterday morning, and then twice today.  She does not remember much about the falls other than feeling very lightheaded.  Has a slight headache right now and feels her toes are tingling.  History of diabetes, states her blood sugars been running higher pad several days.  Review of Systems  Positive: Headache, nausea, pre-/syncope, fatigue Negative: Chest pain, shortness of breath, focal weakness  Physical Exam  BP 133/76 (BP Location: Left Arm)   Pulse (!) 107   Temp 98 F (36.7 C) (Oral)   Resp 18   Ht 5\' 5"  (1.651 m)   Wt 81.6 kg   SpO2 99%   BMI 29.95 kg/m  Gen:   Awake, no distress   Resp:  Normal effort  MSK:   Moves extremities without difficulty  Other:  Appears very pale and tired  Medical Decision Making  Medically screening exam initiated at 2:13 PM.  Appropriate orders placed.  [name] was informed that the remainder of the evaluation will be completed by another provider, this initial triage assessment does not replace that evaluation, and the importance of remaining in the ED until their evaluation is complete.     Eyad Rochford T, PA-C 11/28/21 1433

## 2021-11-28 NOTE — ED Notes (Signed)
Pt voided in bed, changed wet bedding, pt up to bedside commode, pt very unsteady on feet, pt helped back into bed, pt placed on pure wick.

## 2021-11-28 NOTE — ED Provider Notes (Signed)
[facility] EMERGENCY DEPARTMENT Provider Note   CSN: [id number] Arrival date & time: 11/28/21  1232     History  Chief Complaint  Patient presents with   Fall   Near Syncope    [name] is a 56 y.o. female with history of asthma, fatty liver disease, depression, HTN, HLD, multiple sclerosis, type 2 diabetes, and sleep apnea who complains of multiple falls over the past 2 days.  Patient states that she has had multiple episodes of feeling as though she is going to pass out, and believes that she has.  Husband states that she fell yesterday morning, and then twice today.  She does not remember much about the falls other than feeling very lightheaded.  Has a slight headache right now and feels her toes are tingling.  History of diabetes, states her blood sugars been running higher pad several days.    Fall Associated symptoms include headaches. Pertinent negatives include no chest pain and no shortness of breath.  Near Syncope Associated symptoms include headaches. Pertinent negatives include no chest pain and no shortness of breath.       Home Medications Prior to Admission medications   Medication Sig Start Date End Date Taking? Authorizing Provider  albuterol (VENTOLIN HFA) 108 (90 Base) MCG/ACT inhaler Inhale 1-2 puffs into the lungs every 6 (six) hours as needed for wheezing or shortness of breath.  01/02/19   [provider]  baclofen (LIORESAL) 20 MG tablet Take 20 mg by mouth 3 (three) times daily.  05/08/14   [provider]  blood glucose meter kit and supplies KIT Dispense based on patient and insurance preference. Use up to four times daily as directed. (FOR ICD-9 250.00, 250.01). 03/15/19   Geradine Girt, DO  Blood Glucose Monitoring Suppl (ACCU-CHEK GUIDE ME) w/Device KIT 1 Piece by Does not apply route as directed. 09/23/20   Cassandria Anger, MD  citalopram (CELEXA) 40 MG tablet Take 40 mg by mouth every evening.     [provider]   Continuous Blood Gluc Receiver (DEXCOM G7 RECEIVER) DEVI Use to check glucose as directed 10/28/21   Cassandria Anger, MD  Continuous Blood Gluc Sensor (DEXCOM G7 SENSOR) MISC Change sensor every 10 days 10/28/21   Cassandria Anger, MD  gabapentin (NEURONTIN) 800 MG tablet Take 800 mg by mouth 4 (four) times daily. 01/02/19   [provider]  glipiZIDE (GLUCOTROL XL) 5 MG 24 hr tablet Take 1 tablet (5 mg total) by mouth daily with breakfast. 10/15/21   Nida, Marella Chimes, MD  glucose blood (ACCU-CHEK GUIDE) test strip Use to test glucose 4 times daily 09/23/20   Cassandria Anger, MD  insulin isophane & regular human KwikPen (NOVOLIN 70/30 KWIKPEN) (70-30) 100 UNIT/ML KwikPen Inject 60 Units into the skin 2 (two) times daily before a meal. 10/28/21   Nida, Marella Chimes, MD  Insulin Pen Needle (B-D ULTRAFINE III SHORT PEN) 31G X 8 MM MISC USE TO INJECT INSULIN 2 TIMES DAILY 07/16/21   Nida, Marella Chimes, MD  lisinopril (ZESTRIL) 5 MG tablet TAKE ONE TABLET BY MOUTH ONCE DAILY. 08/27/21   Cassandria Anger, MD  metFORMIN (GLUCOPHAGE) 500 MG tablet Take 2 tablets (1,000 mg total) by mouth 2 (two) times daily. 10/15/21   Cassandria Anger, MD  metoprolol succinate (TOPROL-XL) 50 MG 24 hr tablet Take 1 tablet (50 mg total) by mouth daily. Take with or immediately following a meal. 01/29/20 04/28/20  Imogene Burn, PA-C  ondansetron (ZOFRAN) 4 MG tablet Take 4 mg by mouth 4 (four) times daily as needed. 03/06/19   [provider]  Oxycodone HCl 10 MG TABS Take 10 mg by mouth every 4 (four) hours as needed (for pain).  01/03/19   [provider]  pravastatin (PRAVACHOL) 40 MG tablet Take 1 tablet (40 mg total) by mouth every evening. 03/15/19   Black, Lezlie Octave, NP  Semaglutide, 2 MG/DOSE, 8 MG/3ML SOPN Inject 2 mg as directed once a week. 10/15/21   Cassandria Anger, MD      Allergies    Peanut-containing drug products, Shellfish allergy, Gluten meal,  Latex, Prednisone, Nexium [esomeprazole magnesium], and Tape    Review of Systems   Review of Systems  Constitutional:  Positive for chills and fatigue.  HENT:  Negative for congestion.   Respiratory:  Negative for cough and shortness of breath.   Cardiovascular:  Positive for near-syncope. Negative for chest pain.  Gastrointestinal:  Positive for nausea. Negative for vomiting.  Genitourinary:  Negative for dysuria, flank pain and hematuria.  Neurological:  Positive for syncope and headaches. Negative for facial asymmetry and speech difficulty.  All other systems reviewed and are negative.   Physical Exam Updated Vital Signs BP (!) 186/88 (BP Location: Right Arm)   Pulse (!) 144   Temp (!) 103.5 F (39.7 C) (Oral)   Resp (!) 23   Ht _0  (1.651 m)   Wt 81.6 kg   SpO2 92%   BMI 29.95 kg/m  Physical Exam Vitals and nursing note reviewed.  Constitutional:      General: She is awake.     Appearance: Normal appearance.     Comments: Appears pale and very tired  HENT:     Head: Normocephalic and atraumatic.  Eyes:     Conjunctiva/sclera: Conjunctivae normal.  Cardiovascular:     Rate and Rhythm: Regular rhythm. Tachycardia present.  Pulmonary:     Effort: Pulmonary effort is normal. No respiratory distress.     Breath sounds: Normal breath sounds.  Abdominal:     General: There is no distension.     Palpations: Abdomen is soft.     Tenderness: There is generalized abdominal tenderness. There is no right CVA tenderness, left CVA tenderness or guarding.  Skin:    General: Skin is warm and dry.     Comments: No wounds  Neurological:     General: No focal deficit present.     ED Results / Procedures / Treatments   Labs (all labs ordered are listed, but only abnormal results are displayed) Labs Reviewed  COMPREHENSIVE METABOLIC PANEL - Abnormal; Notable for the following components:      Result Value   Potassium 3.4 (*)    Glucose, Bld 219 (*)    BUN 33 (*)     Creatinine, Ser 2.05 (*)    Calcium 8.2 (*)    Albumin 3.0 (*)    Alkaline Phosphatase 129 (*)    GFR, Estimated 28 (*)    All other components within normal limits  CBC - Abnormal; Notable for the following components:   WBC 21.5 (*)    Hemoglobin 10.6 (*)    HCT 34.7 (*)    MCV 79.6 (*)    MCH 24.3 (*)    All other components within normal limits  URINALYSIS, ROUTINE W REFLEX MICROSCOPIC - Abnormal; Notable for the following components:   APPearance HAZY (*)    Hgb urine dipstick MODERATE (*)  Protein, ur 30 (*)    Leukocytes,Ua LARGE (*)    WBC, UA >50 (*)    Bacteria, UA FEW (*)    All other components within normal limits  LACTIC ACID, PLASMA - Abnormal; Notable for the following components:   Lactic Acid, Venous 2.0 (*)    All other components within normal limits  PROTIME-INR - Abnormal; Notable for the following components:   Prothrombin Time 15.4 (*)    All other components within normal limits  CBG MONITORING, ED - Abnormal; Notable for the following components:   Glucose-Capillary 218 (*)    All other components within normal limits  CULTURE, BLOOD (ROUTINE X 2)  CULTURE, BLOOD (ROUTINE X 2)  URINE CULTURE  APTT  LACTIC ACID, PLASMA  COMPREHENSIVE METABOLIC PANEL  CBC  HIV ANTIBODY (ROUTINE TESTING W REFLEX)  HEMOGLOBIN A1C  POC URINE PREG, ED    EKG None  Radiology DG Chest Port 1 View  Result Date: 11/28/2021 CLINICAL DATA:  Sepsis. EXAM: PORTABLE CHEST 1 VIEW COMPARISON:  Chest radiograph 10/31/2021 FINDINGS: Stable cardiac and mediastinal contours. Low lung volume exam. Basilar atelectasis. No pleural effusion or pneumothorax. IMPRESSION: No active disease. Electronically Signed   By: Lovey Newcomer M.D.   On: 11/28/2021 15:49   CT Head Wo Contrast  Result Date: 11/28/2021 CLINICAL DATA:  Near syncope, fall, EXAM: CT HEAD WITHOUT CONTRAST TECHNIQUE: Contiguous axial images were obtained from the base of the skull through the vertex without intravenous  contrast. RADIATION DOSE REDUCTION: This exam was performed according to the departmental dose-optimization program which includes automated exposure control, adjustment of the mA and/or kV according to patient size and/or use of iterative reconstruction technique. COMPARISON:  04/06/2020 FINDINGS: Brain: No acute intracranial findings are seen. There are no signs of bleeding within the cranium. Ventricles are not dilated. There are no epidural or subdural fluid collections. There is no focal edema or mass effect. Vascular: Unremarkable Skull: Unremarkable. Sinuses/Orbits: Unremarkable. Other: None. IMPRESSION: No acute intracranial findings are seen in noncontrast CT brain. Electronically Signed   By: Elmer Picker M.D.   On: 11/28/2021 15:32    Procedures .Critical Care  Performed by: Kateri Plummer, PA-C Authorized by: Kateri Plummer, PA-C   Critical care provider statement:    Critical care time (minutes):  30   Critical care was necessary to treat or prevent imminent or life-threatening deterioration of the following conditions:  Sepsis and renal failure   Critical care was time spent personally by me on the following activities:  Development of treatment plan with patient or surrogate, discussions with consultants, evaluation of patient's response to treatment, examination of patient, ordering and review of laboratory studies, ordering and review of radiographic studies, ordering and performing treatments and interventions, pulse oximetry, re-evaluation of patient's condition, review of old charts and obtaining history from patient or surrogate   Care discussed with: admitting provider       Medications Ordered in ED Medications  lactated ringers infusion ( Intravenous New Bag/Given 11/28/21 1732)  ibuprofen (ADVIL) tablet 800 mg (has no administration in time range)  potassium chloride SA (KLOR-CON M) CR tablet 20 mEq (has no administration in time range)  oxyCODONE (Oxy  IR/ROXICODONE) immediate release tablet 10 mg (has no administration in time range)  hydrALAZINE (APRESOLINE) injection 5 mg (has no administration in time range)  ceFEPIme (MAXIPIME) 2 g in sodium chloride 0.9 % 100 mL IVPB (has no administration in time range)  metroNIDAZOLE (FLAGYL) IVPB 500 mg (has  no administration in time range)  famotidine (PEPCID) IVPB 20 mg premix (has no administration in time range)  heparin injection 5,000 Units (has no administration in time range)  acetaminophen (TYLENOL) tablet 650 mg (has no administration in time range)    Or  acetaminophen (TYLENOL) suppository 650 mg (has no administration in time range)  ondansetron (ZOFRAN) tablet 4 mg (has no administration in time range)    Or  ondansetron (ZOFRAN) injection 4 mg (has no administration in time range)  vancomycin (VANCOREADY) IVPB 1500 mg/300 mL (has no administration in time range)  insulin aspart (novoLOG) injection 0-9 Units (has no administration in time range)  potassium chloride 10 mEq in 100 mL IVPB (has no administration in time range)  vancomycin (VANCOREADY) IVPB 1500 mg/300 mL (has no administration in time range)  ceFEPIme (MAXIPIME) 2 g in sodium chloride 0.9 % 100 mL IVPB (has no administration in time range)  sodium chloride 0.9 % bolus 1,000 mL (0 mLs Intravenous Stopped 11/28/21 1642)  lactated ringers bolus 1,000 mL (0 mLs Intravenous Stopped 11/28/21 1708)    And  lactated ringers bolus 1,000 mL (0 mLs Intravenous Stopped 11/28/21 1731)    And  lactated ringers bolus 500 mL (0 mLs Intravenous Stopped 11/28/21 1808)  acetaminophen (TYLENOL) tablet 1,000 mg (1,000 mg Oral Given 11/28/21 1709)    ED Course/ Medical Decision Making/ A&P                           Medical Decision Making Amount and/or Complexity of Data Reviewed Labs: ordered. Radiology: ordered.  Risk OTC drugs. Prescription drug management.  This patient is a 56 y.o. female who presents to the ED for concern of  generalized fatigue and near/syncope x 2 days, this involves an extensive number of treatment options, and is a complaint that carries with it a high risk of complications and morbidity. The emergent differential diagnosis prior to evaluation includes, but is not limited to, CVA, sepsis, electrolyte disturbance, anemia, dehydration, arrhythmia, hypoglycemia. This is not an exhaustive differential.   Past Medical History / Co-morbidities / Social History: asthma, fatty liver disease, depression, HTN, HLD, multiple sclerosis, type 2 diabetes, and sleep apnea  Additional history: Chart reviewed. Pertinent results include: No history of atrial fibrillation. Similar visit of fall with near syncope in January 2022.   Physical Exam: Physical exam performed. The pertinent findings include: Patient very warm to the touch and appears very pale. Tachycardic but regular. Generalized abdominal tenderness without guarding.    Lab Tests: I ordered, and personally interpreted labs.  The pertinent results include: Leukocytosis of 21.5. Hemoglobin 10.6, compared to 11.9 one year ago. Potassium 3.4. Creatinine 2.05, compared to 0.66 three months ago. GFR 28. Urinalysis with moderate hemoglobin, large leukocytes, and > 50 WBCs. Lactic acid 2.0. Glucose 218.    Imaging Studies: I ordered imaging studies including CT head and chest x-ray. I independently visualized and interpreted imaging which showed no acute intracranial or cardiopulmonary abnormalities. I agree with the radiologist interpretation.   Cardiac Monitoring:  The patient was maintained on a cardiac monitor.  My attending physician Dr. Truett Mainland viewed and interpreted the cardiac monitored which showed an underlying rhythm of: sinus tachycardia. I agree with this interpretation.   Medications: I ordered medication including IV fluids, tylenol, motrin, rocephin  for urosepsis and fever. Patient's home pain medicine ordered. I have reviewed the patients  home medicines and have made adjustments as needed.  Consultations Obtained:  I requested consultation with the hospitalist Dr Waldron [facility],  and discussed lab and imaging findings as well as pertinent plan - they recommend: medical admission   Disposition: After consideration of the diagnostic results and the patients response to treatment, I feel that patient is requiring medical admission for urosepsis with acute renal failure.  I discussed this case with my attending physician Dr. Truett Mainland who cosigned this note including patient's presenting symptoms, physical exam, and planned diagnostics and interventions. Attending physician stated agreement with plan or made changes to plan which were implemented.     Final Clinical Impression(s) / ED Diagnoses Final diagnoses:  Sepsis with acute renal failure without septic shock, due to unspecified organism, unspecified acute renal failure type [facility])    Rx / DC Orders ED Discharge Orders     None      Portions of this report may have been transcribed using voice recognition software. Every effort was made to ensure accuracy; however, inadvertent computerized transcription errors may be present.    Estill Cotta 11/28/21 1824    Cristie Hem, MD 11/28/21 2233

## 2021-11-28 NOTE — H&P (Signed)
TRH H&P   Patient Demographics:    [name], is a 56 y.o. female  MRN: [id number]   DOB - 08-23-1965  Admit Date - 11/28/2021  Outpatient Primary MD for the patient is Redmond School, MD  Referring MD/NP/PA: PA Roemhildt  Patient coming from: home  Chief Complaint  Patient presents with   Fall   Near Syncope      HPI:    [name]  is a 56 y.o. female, past medical history of TIA, migraine, MS, hypertension, hyperlipidemia, diabetes mellitus, anxiety, asthma.  Fatty liver disease, sleep apnea. -Patient presents to ED secondary to falls, and is poor historian, confused, husband at bedside assist with the history, patient presents with complaints of multiple falls over the past 2 days, she reports multiple episodes of feeling like passing out, Gent[place]ol does not remember much about those events, but it was noted by her husband, as well she does report burning sensation, abdominal pain currently, reports having fever and chills, as well reports her blood sugar has been uncontrolled, - in ED patient was noted to be in severe sepsis, with MEWS  score of 6, tachycardic in the 140s, lactic acid at 2, febrile 103.5, white blood cell count of 21.5, glucose elevated at 219, creatinine elevated at 2.05, urine analysis is positive, potassium low at 3.4, [facility] hospitalist consulted to admit    Review of systems:     A full 10 point Review of Systems was done, except as stated above, all other Review of Systems were negative.   With Past History of the following :    Past Medical History:  Diagnosis Date   Asthma    Chronic abdominal pain    Chronic neck pain    C4-6 fusion   CONSTIPATION 05/16/2009   Qualifier: Diagnosis of  By: Ronnald Ramp FNP-BC, Kandice L    Diabetes mellitus without complication ([place])    Dysphagia 02/23/2012   Fatty liver    GERD (gastroesophageal reflux disease)     Helicobacter pylori gastritis 2011   Hyperlipidemia    Hypertension    LIVER FUNCTION TESTS, ABNORMAL, HX OF 05/14/2009   Qualifier: Diagnosis of  By: Olena Heckle, [place]     Migraine headache    MIGRAINE, COMMON 05/14/2009   Qualifier: Diagnosis of  By: Stephan Minister     MS (multiple sclerosis) [facility])    Sleep apnea       Past Surgical History:  Procedure Laterality Date   ABDOMINAL HYSTERECTOMY     APPENDECTOMY     BACK SURGERY  03/07/2012   Nerve Stimulator   CHOLECYSTECTOMY     COLONOSCOPY  07/09/2009   RMR; normal rectum aside from anal canal hemorrhoids/scattered left-sided diverticula   ESOPHAGOGASTRODUODENOSCOPY  05/22/2009   RMR; normal /small HH   ESOPHAGOGASTRODUODENOSCOPY  2007   RMR: non-critical Schatzki's rin, non-maniuplated   ESOPHAGOGASTRODUODENOSCOPY N/A 10/24/2013   Procedure: ESOPHAGOGASTRODUODENOSCOPY (  EGD);  Surgeon: Daneil Dolin, MD;  Location: [facility];  Service: Endoscopy;  Laterality: N/A;  9:45   ESOPHAGOGASTRODUODENOSCOPY (EGD) WITH ESOPHAGEAL DILATION  03/17/2012   NGE:[id number] appearing esophageal mucosa of uncertain significance. Status post Venia Minks dilation followed by esophageal bx   MALONEY DILATION N/A 10/24/2013   Procedure: Venia Minks DILATION;  Surgeon: Daneil Dolin, MD;  Location: [facility];  Service: Endoscopy;  Laterality: N/A;   MANDIBLE SURGERY     NECK SURGERY     X2, last time 2013      Social History:     Social History   Tobacco Use   Smoking status: Never   Smokeless tobacco: Never  Substance Use Topics   Alcohol use: No        Family History :     Family History  Problem Relation Age of Onset   Cancer Mother        unknown type   Diabetes Father    Diabetes Sister    Colon cancer Neg Hx      Home Medications:   Prior to Admission medications   Medication Sig Start Date End Date Taking? Authorizing Provider  albuterol (VENTOLIN HFA) 108 (90 Base) MCG/ACT inhaler Inhale 1-2 puffs into the lungs  every 6 (six) hours as needed for wheezing or shortness of breath.  01/02/19   [provider]  baclofen (LIORESAL) 20 MG tablet Take 20 mg by mouth 3 (three) times daily.  05/08/14   [provider]  blood glucose meter kit and supplies KIT Dispense based on patient and insurance preference. Use up to four times daily as directed. (FOR ICD-9 250.00, 250.01). 03/15/19   Geradine Girt, DO  Blood Glucose Monitoring Suppl (ACCU-CHEK GUIDE ME) w/Device KIT 1 Piece by Does not apply route as directed. 09/23/20   Cassandria Anger, MD  citalopram (CELEXA) 40 MG tablet Take 40 mg by mouth every evening.     [provider]  Continuous Blood Gluc Receiver (DEXCOM G7 RECEIVER) DEVI Use to check glucose as directed 10/28/21   Cassandria Anger, MD  Continuous Blood Gluc Sensor (DEXCOM G7 SENSOR) MISC Change sensor every 10 days 10/28/21   Cassandria Anger, MD  gabapentin (NEURONTIN) 800 MG tablet Take 800 mg by mouth 4 (four) times daily. 01/02/19   [provider]  glipiZIDE (GLUCOTROL XL) 5 MG 24 hr tablet Take 1 tablet (5 mg total) by mouth daily with breakfast. 10/15/21   Nida, Marella Chimes, MD  glucose blood (ACCU-CHEK GUIDE) test strip Use to test glucose 4 times daily 09/23/20   Cassandria Anger, MD  insulin isophane & regular human KwikPen (NOVOLIN 70/30 KWIKPEN) (70-30) 100 UNIT/ML KwikPen Inject 60 Units into the skin 2 (two) times daily before a meal. 10/28/21   Nida, Marella Chimes, MD  Insulin Pen Needle (B-D ULTRAFINE III SHORT PEN) 31G X 8 MM MISC USE TO INJECT INSULIN 2 TIMES DAILY 07/16/21   Nida, Marella Chimes, MD  lisinopril (ZESTRIL) 5 MG tablet TAKE ONE TABLET BY MOUTH ONCE DAILY. 08/27/21   Cassandria Anger, MD  metFORMIN (GLUCOPHAGE) 500 MG tablet Take 2 tablets (1,000 mg total) by mouth 2 (two) times daily. 10/15/21   Cassandria Anger, MD  metoprolol succinate (TOPROL-XL) 50 MG 24 hr tablet Take 1 tablet (50 mg total) by mouth daily.  Take with or immediately following a meal. 01/29/20 04/28/20  Imogene Burn, PA-C  ondansetron (ZOFRAN) 4 MG tablet Take 4  mg by mouth 4 (four) times daily as needed. 03/06/19   [provider]  Oxycodone HCl 10 MG TABS Take 10 mg by mouth every 4 (four) hours as needed (for pain).  01/03/19   [provider]  pravastatin (PRAVACHOL) 40 MG tablet Take 1 tablet (40 mg total) by mouth every evening. 03/15/19   Black, Lezlie Octave, NP  Semaglutide, 2 MG/DOSE, 8 MG/3ML SOPN Inject 2 mg as directed once a week. 10/15/21   Cassandria Anger, MD     Allergies:     Allergies  Allergen Reactions   Peanut-Containing Drug Products Anaphylaxis and Hives    Patient states she is allergic to all nuts   Shellfish Allergy Anaphylaxis   Gluten Meal    Latex Other (See Comments)    Reaction: blistering   Prednisone Hives and Other (See Comments)    Reaction: causes psychological issues    Nexium [Esomeprazole Magnesium] Hives   Tape Rash    Paper tape please     Physical Exam:   Vitals  Blood pressure (!) 186/88, pulse (!) 144, temperature (!) 103.5 F (39.7 C), temperature source Oral, resp. rate (!) 23, height '5\' 5"'  (1.651 m), weight 81.6 kg, SpO2 92 %.   1. General ill-appearing female, laying in bed, mild discomfort  2.  Is confused, delirious due to high fevers.  Able to answer questions appropriately  3. No F.N deficits, ALL C.Nerves Intact, Strength 5/5 all 4 extremities, Sensation intact all 4 extremities, Plantars down going.  4. Ears and Eyes appear Normal, Conjunctivae clear, PERRLA. Moist Oral Mucosa.  5. Supple Neck, No JVD, No cervical lymphadenopathy appriciated, No Carotid Bruits.  6. Symmetrical Chest wall movement, Good air movement bilaterally, CTAB.  7.  Tachycardic, No Gallops, Rubs or Murmurs, No Parasternal Heave.  8. Positive Bowel Sounds, Fuhs abdominal tenderness with bilateral CVA and lower back tenderness to palpation as well   9.  No  Cyanosis, Normal Skin Turgor, No Skin Rash or Bruise.  10. Good muscle tone,  joints appear normal , no effusions, Normal ROM.  Patient with asterixis like movement.     Data Review:    CBC Recent Labs  Lab 11/28/21 1431  WBC 21.5*  HGB 10.6*  HCT 34.7*  PLT 230  MCV 79.6*  MCH 24.3*  MCHC 30.5  RDW 14.6   ------------------------------------------------------------------------------------------------------------------  Chemistries  Recent Labs  Lab 11/28/21 1431  NA 137  K 3.4*  CL 104  CO2 27  GLUCOSE 219*  BUN 33*  CREATININE 2.05*  CALCIUM 8.2*  AST 39  ALT 23  ALKPHOS 129*  BILITOT 0.6   ------------------------------------------------------------------------------------------------------------------ estimated creatinine clearance is 32.3 mL/min (A) (by C-G formula based on SCr of 2.05 mg/dL (H)). ------------------------------------------------------------------------------------------------------------------ No results for input(s): "TSH", "T4TOTAL", "T3FREE", "THYROIDAB" in the last 72 hours.  Invalid input(s): "FREET3"  Coagulation profile Recent Labs  Lab 11/28/21 1653  INR 1.2   ------------------------------------------------------------------------------------------------------------------- No results for input(s): "DDIMER" in the last 72 hours. -------------------------------------------------------------------------------------------------------------------  Cardiac Enzymes No results for input(s): "CKMB", "TROPONINI", "MYOGLOBIN" in the last 168 hours.  Invalid input(s): "CK" ------------------------------------------------------------------------------------------------------------------ No results found for: "BNP"   ---------------------------------------------------------------------------------------------------------------  Urinalysis    Component Value Date/Time   COLORURINE YELLOW 11/28/2021 1514   APPEARANCEUR HAZY (A)  11/28/2021 1514   LABSPEC 1.012 11/28/2021 1514   PHURINE 5.0 11/28/2021 1514   GLUCOSEU NEGATIVE 11/28/2021 1514   HGBUR MODERATE (A) 11/28/2021 1514   BILIRUBINUR NEGATIVE 11/28/2021 1514   KETONESUR NEGATIVE  11/28/2021 1514   PROTEINUR 30 (A) 11/28/2021 1514   UROBILINOGEN 1.0 06/26/2014 1720   NITRITE NEGATIVE 11/28/2021 1514   LEUKOCYTESUR LARGE (A) 11/28/2021 1514    ----------------------------------------------------------------------------------------------------------------   Imaging Results:    DG Chest Port 1 View  Result Date: 11/28/2021 CLINICAL DATA:  Sepsis. EXAM: PORTABLE CHEST 1 VIEW COMPARISON:  Chest radiograph 10/31/2021 FINDINGS: Stable cardiac and mediastinal contours. Low lung volume exam. Basilar atelectasis. No pleural effusion or pneumothorax. IMPRESSION: No active disease. Electronically Signed   By: Lovey Newcomer M.D.   On: 11/28/2021 15:49   CT Head Wo Contrast  Result Date: 11/28/2021 CLINICAL DATA:  Near syncope, fall, EXAM: CT HEAD WITHOUT CONTRAST TECHNIQUE: Contiguous axial images were obtained from the base of the skull through the vertex without intravenous contrast. RADIATION DOSE REDUCTION: This exam was performed according to the departmental dose-optimization program which includes automated exposure control, adjustment of the mA and/or kV according to patient size and/or use of iterative reconstruction technique. COMPARISON:  04/06/2020 FINDINGS: Brain: No acute intracranial findings are seen. There are no signs of bleeding within the cranium. Ventricles are not dilated. There are no epidural or subdural fluid collections. There is no focal edema or mass effect. Vascular: Unremarkable Skull: Unremarkable. Sinuses/Orbits: Unremarkable. Other: None. IMPRESSION: No acute intracranial findings are seen in noncontrast CT brain. Electronically Signed   By: Elmer Picker M.D.   On: 11/28/2021 15:32    My personal review of EKG: Rhythm sinus tachycardia,  Rate  143 /min, QTc 432 , no Acute ST changes   Assessment & Plan:    Principal Problem:   Severe sepsis (HCC) Active Problems:   Esophageal reflux   HTN (hypertension)   Mixed hyperlipidemia   Multiple sclerosis (HCC)   Uncontrolled type 2 diabetes mellitus with hyperglycemia (HCC)   Fatty liver   Hypokalemia   AKI (acute kidney injury) ([place])   LFT elevation   Severe sepsis -Severe sepsis present on admission, elevated lactic acid, tachypneic, tachycardic, altered mental status, with fever of 104. -Admitted under septic pathway, follow on urine cultures, blood cultures, UA is positive, chest x-ray with no acute findings -Abdomen tenderness, will obtain stat CT abdomen pelvis to evaluate for renal stone or any other abdominal pathology. -We will keep on broad-spectrum antibiotic vancomycin, cefepime and Flagyl , and can narrow antibiotic 1 more culture data is available -She will be admitted to ICU, high risk for decompensation  AKI -Due to sepsis, continue with IV fluids and avoid nephrotoxic medication(will have to tolerate some as needed Motrin given very high fevers up to 104, prior to receiving full dose Tylenol.   GERD -we will start on IV Pepcid  Hypertension -We will resume her on beta-blockers for now and resume rest of meds when more stable  Acute metabolic encephalopathy  She is confused, due to sepsis and fever, will hold gabapentin for now  Diabetes mellitus insulin-dependent, with hyperglycemia -We will hold semaglutide, glipizide, metformin and insulin 70/30, will keep in instead of Semglee and insulin sliding scale specially she is n.p.o.  Hypokalemia - repleted  Fatty liver Elevated alk phos -alk phos mildly elevated due to sepsis, total bili within normal limit  History of multiple sclerosis History of CVA -Appears to be with asterixis like body movement, will check ammonia level.  DVT Prophylaxis Heparin   AM Labs Ordered, also please review  Full Orders  Family Communication: Admission, patients condition and plan of care including tests being ordered have been discussed with the  patient and husband  who indicate understanding and agree with the plan and Code Status.  Code Status full  Likely DC to  home  Condition GUARDED    Consults called: none    Admission status: inpatient    Time spent in minutes : 75 minutes   Phillips Climes M.D on 11/28/2021 at 6:19 PM   [facility] - Office  [phone]

## 2021-11-28 NOTE — Sepsis Progress Note (Addendum)
Code Sepsis protocol being monitored by eLink. 

## 2021-11-28 NOTE — ED Notes (Signed)
Pt placed on 2 L O2 via [place] secondary to low O2 sat.   

## 2021-11-28 NOTE — ED Triage Notes (Addendum)
Pt brought in by her husband with c/o falls over the last 2 days. Pt has an abrasion on both knees. Pt appears to be falling asleep during triage and is rocking. Pt reports she feels as if she is going to pass out. Husband reports this is the new over the past 2 days.

## 2021-11-28 NOTE — Progress Notes (Addendum)
Pharmacy Antibiotic Note  [name] is a 56 y.o. female admitted on 11/28/2021 with  infection of unknown source .  Pharmacy has been consulted for vanco and cefepime dosing.  Plan: Vancomycin 1500 IV every 48 hours.  Goal AUC 400-550 mcg*hr/mL eAUC 468 mcg*hr/mL. Scr utilized: 2.05 mg/dL Cefepime 2 grams iv Q11H Flagyl 500 mg iv q12h (MD dosed) F/up on micros, scr changes, de-escalation opportunities, and LOT  Height: 5\' 5"  (165.1 cm) Weight: 81.6 kg (180 lb) IBW/kg (Calculated) : 57  Temp (24hrs), Avg:100.9 F (38.3 C), Min:98 F (36.7 C), Max:103.5 F (39.7 C)  Recent Labs  Lab 11/28/21 1431 11/28/21 1538  WBC 21.5*  --   CREATININE 2.05*  --   LATICACIDVEN  --  2.0*    Estimated Creatinine Clearance: 32.3 mL/min (A) (by C-G formula based on SCr of 2.05 mg/dL (H)).    Allergies  Allergen Reactions   Peanut-Containing Drug Products Anaphylaxis and Hives    Patient states she is allergic to all nuts   Shellfish Allergy Anaphylaxis   Gluten Meal    Latex Other (See Comments)    Reaction: blistering   Prednisone Hives and Other (See Comments)    Reaction: causes psychological issues    Nexium [Esomeprazole Magnesium] Hives   Tape Rash    Paper tape please    Antimicrobials this admission: Cefepime 9/1 >>  Vanco 9/1 >>  Flagyl 9/1 >>  Microbiology results: 9/1 BCx: pending 9/1 UCx: pending    Thank you for allowing pharmacy to be a part of this patient's care.  11/1 BS, PharmD, BCPS Clinical Pharmacist 11/28/2021 6:23 PM  Contact: [phone] after 3 PM  "Be curious, not judgmental..." [phone]

## 2021-11-29 DIAGNOSIS — N179 Acute kidney failure, unspecified: Secondary | ICD-10-CM | POA: Diagnosis not present

## 2021-11-29 DIAGNOSIS — R7881 Bacteremia: Secondary | ICD-10-CM | POA: Diagnosis not present

## 2021-11-29 DIAGNOSIS — I10 Essential (primary) hypertension: Secondary | ICD-10-CM | POA: Diagnosis not present

## 2021-11-29 DIAGNOSIS — A419 Sepsis, unspecified organism: Secondary | ICD-10-CM | POA: Diagnosis not present

## 2021-11-29 LAB — BLOOD CULTURE ID PANEL (REFLEXED) - BCID2

## 2021-11-29 LAB — COMPREHENSIVE METABOLIC PANEL
ALT: 18 U/L (ref 0–44)
AST: 28 U/L (ref 15–41)
Albumin: 2.4 g/dL — ABNORMAL LOW (ref 3.5–5.0)
Alkaline Phosphatase: 106 U/L (ref 38–126)
Anion gap: 7 (ref 5–15)
BUN: 29 mg/dL — ABNORMAL HIGH (ref 6–20)
CO2: 22 mmol/L (ref 22–32)
Calcium: 7.6 mg/dL — ABNORMAL LOW (ref 8.9–10.3)
Chloride: 110 mmol/L (ref 98–111)
Creatinine, Ser: 1.33 mg/dL — ABNORMAL HIGH (ref 0.44–1.00)
GFR, Estimated: 47 mL/min — ABNORMAL LOW (ref >60)
Glucose, Bld: 67 mg/dL — ABNORMAL LOW (ref 70–99)
Potassium: 3.8 mmol/L (ref 3.5–5.1)
Sodium: 139 mmol/L (ref 135–145)
Total Bilirubin: 0.8 mg/dL (ref 0.3–1.2)
Total Protein: 6.4 g/dL — ABNORMAL LOW (ref 6.5–8.1)

## 2021-11-29 LAB — CBC
HCT: 29.4 % — ABNORMAL LOW (ref 36.0–46.0)
Hemoglobin: 8.8 g/dL — ABNORMAL LOW (ref 12.0–15.0)
MCH: 24.3 pg — ABNORMAL LOW (ref 26.0–34.0)
MCHC: 29.9 g/dL — ABNORMAL LOW (ref 30.0–36.0)
MCV: 81.2 fL (ref 80.0–100.0)
Platelets: 150 10*3/uL (ref 150–400)
RBC: 3.62 MIL/uL — ABNORMAL LOW (ref 3.87–5.11)
RDW: 14.6 % (ref 11.5–15.5)
WBC: 10.9 10*3/uL — ABNORMAL HIGH (ref 4.0–10.5)
nRBC: 0 % (ref 0.0–0.2)

## 2021-11-29 LAB — GLUCOSE, CAPILLARY
Glucose-Capillary: 76 mg/dL (ref 70–99)
Glucose-Capillary: 64 mg/dL — ABNORMAL LOW (ref 70–99)
Glucose-Capillary: 119 mg/dL — ABNORMAL HIGH (ref 70–99)
Glucose-Capillary: 63 mg/dL — ABNORMAL LOW (ref 70–99)
Glucose-Capillary: 98 mg/dL (ref 70–99)
Glucose-Capillary: 67 mg/dL — ABNORMAL LOW (ref 70–99)
Glucose-Capillary: 101 mg/dL — ABNORMAL HIGH (ref 70–99)
Glucose-Capillary: 189 mg/dL — ABNORMAL HIGH (ref 70–99)

## 2021-11-29 LAB — AMMONIA: Ammonia: 16 umol/L (ref 9–35)

## 2021-11-29 LAB — HIV ANTIBODY (ROUTINE TESTING W REFLEX): HIV Screen 4th Generation wRfx: NONREACTIVE

## 2021-11-29 MED ORDER — DEXTROSE 50 % IV SOLN
12.5000 g | INTRAVENOUS | Status: AC
  Administered 2021-11-29: 12.5 g via INTRAVENOUS

## 2021-11-29 MED ORDER — VANCOMYCIN HCL IN DEXTROSE 1-5 GM/200ML-% IV SOLN: 1000.0000 mg | INTRAVENOUS | Status: DC

## 2021-11-29 MED ORDER — BACLOFEN 10 MG PO TABS
20.0000 mg | ORAL_TABLET | Freq: Three times a day (TID) | ORAL | Status: DC
  Administered 2021-11-29 – 2021-12-02 (×9): 20 mg via ORAL
  Filled 2021-11-29 (×10): qty 2

## 2021-11-29 MED ORDER — OXYCODONE HCL 5 MG PO TABS
10.0000 mg | ORAL_TABLET | Freq: Four times a day (QID) | ORAL | Status: DC | PRN
  Administered 2021-11-29 – 2021-12-01 (×3): 10 mg via ORAL
  Filled 2021-11-29 (×3): qty 2

## 2021-11-29 MED ORDER — DEXTROSE 50 % IV SOLN
INTRAVENOUS | Status: AC
  Administered 2021-11-29: 12.5 g
  Filled 2021-11-29: qty 50

## 2021-11-29 MED ORDER — GABAPENTIN 400 MG PO CAPS
800.0000 mg | ORAL_CAPSULE | Freq: Three times a day (TID) | ORAL | Status: DC
  Administered 2021-11-29 – 2021-12-02 (×9): 800 mg via ORAL
  Filled 2021-11-29 (×9): qty 2

## 2021-11-29 MED ORDER — DEXTROSE 50 % IV SOLN
INTRAVENOUS | Status: AC
  Filled 2021-11-29: qty 50

## 2021-11-29 NOTE — Progress Notes (Signed)
PROGRESS NOTE    [name]  QIH:[id number] DOB: 27-Jan-1966 DOA: 11/28/2021 PCP: Elfredia Nevins, MD    Brief Narrative:  56 year old female with history of multiple sclerosis, hypertension, hyperlipidemia, diabetes, presented to emergency room with falls, confusion.  Noted to be febrile, tachycardic.  Noted to have sepsis secondary to gram-negative bacteremia.  Felt to be related to possible pyelonephritis.  Started on broad-spectrum antibiotics.   Assessment & Plan:   Principal Problem:   Severe sepsis (HCC) Active Problems:   Esophageal reflux   HTN (hypertension)   Mixed hyperlipidemia   Multiple sclerosis (HCC)   Uncontrolled type 2 diabetes mellitus with hyperglycemia (HCC)   Fatty liver   Hypokalemia   AKI (acute kidney injury) (HCC)   LFT elevation   Gram-negative bacteremia   Severe sepsis secondary to gram-negative bacteremia, likely related to UTI -Patient admitted with tachypnea, tachycardia, elevated lactate, fever and altered mental status -Blood cultures positive for gram-negative rods -CT indicated possible pyelonephritis, no obstructing stone identified -Continue IV antibiotics  Acute kidney injury -Secondary to sepsis -Avoid nephrotoxic agents -Started on IV fluids with improvement of renal function  GERD -Continue Pepcid  Acute metabolic encephalopathy -Likely related to sepsis and fever -Appears to be back to baseline  Diabetes, insulin-dependent, uncontrolled with hypoglycemia -Currently having episodes of hypoglycemia -Likely related to sulfonylurea in the setting of renal failure -Currently on D10 infusion -Continue to follow CBG -Encouraging p.o. intake  Nausea and vomiting -Suspect this is related to UTI -No bowel findings on CT -Treat supportively with antiemetics  History of multiple sclerosis -Continue on baclofen for spasms -Ammonia level is not elevated -Continue gabapentin for chronic  pain  Hypokalemia -Replace  Hypertension -Continue metoprolol -Holding ARB in light of renal issues   DVT prophylaxis: heparin injection 5,000 Units Start: 11/28/21 2200  Code Status: Full code Family Communication: Constipation Disposition Plan: Status is: Inpatient Remains inpatient appropriate because: Continued management with IV antibiotics for gram-negative sepsis     Consultants:    Procedures:    Antimicrobials:  Cefepime 1/1 >   Subjective: She complains of chest discomfort which she says is a chronic issue usually occurs when her heart rate is fast.  She has had this issue since her previous pregnancy.  She is awake and alert.  Objective: Vitals:   11/29/21 1100 11/29/21 1124 11/29/21 1200 11/29/21 1300  BP: (!) 144/54  (!) 130/54 (!) 144/69  Pulse: (!) 110  (!) 106 (!) 101  Resp: 19  16 14   Temp:  99.2 F (37.3 C)    TempSrc:  Oral    SpO2: 100%  100% 100%  Weight:      Height:        Intake/Output Summary (Last 24 hours) at 11/29/2021 1422 Last data filed at 11/29/2021 01/29/2022 Gross per 24 hour  Intake 3692.08 ml  Output 800 ml  Net 2892.08 ml   Filed Weights   11/28/21 1303 11/29/21 0430  Weight: 81.6 kg 92.2 kg    Examination:  General exam: Appears calm and comfortable  Respiratory system: Clear to auscultation. Respiratory effort normal. Cardiovascular system: S1 & S2 heard, RRR. No JVD, murmurs, rubs, gallops or clicks. No pedal edema. Gastrointestinal system: Abdomen is nondistended, soft and nontender. No organomegaly or masses felt. Normal bowel sounds heard. Central nervous system: Alert and oriented. No focal neurological deficits. Extremities: Symmetric 5 x 5 power. Skin: No rashes, lesions or ulcers Psychiatry: Judgement and insight appear normal. Mood & affect appropriate.  Data Reviewed: I have personally reviewed following labs and imaging studies  CBC: Recent Labs  Lab 11/28/21 1431 11/29/21 0347  WBC 21.5* 10.9*   HGB 10.6* 8.8*  HCT 34.7* 29.4*  MCV 79.6* 81.2  PLT 230 150   Basic Metabolic Panel: Recent Labs  Lab 11/28/21 1431 11/29/21 0347  NA 137 139  K 3.4* 3.8  CL 104 110  CO2 27 22  GLUCOSE 219* 67*  BUN 33* 29*  CREATININE 2.05* 1.33*  CALCIUM 8.2* 7.6*   GFR: Estimated Creatinine Clearance: 53 mL/min (A) (by C-G formula based on SCr of 1.33 mg/dL (H)). Liver Function Tests: Recent Labs  Lab 11/28/21 1431 11/29/21 0347  AST 39 28  ALT 23 18  ALKPHOS 129* 106  BILITOT 0.6 0.8  PROT 7.8 6.4*  ALBUMIN 3.0* 2.4*   No results for input(s): "LIPASE", "AMYLASE" in the last 168 hours. Recent Labs  Lab 11/29/21 0348  AMMONIA 16   Coagulation Profile: Recent Labs  Lab 11/28/21 1653  INR 1.2   Cardiac Enzymes: No results for input(s): "CKTOTAL", "CKMB", "CKMBINDEX", "TROPONINI" in the last 168 hours. BNP (last 3 results) No results for input(s): "PROBNP" in the last 8760 hours. HbA1C: Recent Labs    11/28/21 1653  HGBA1C 7.0*   CBG: Recent Labs  Lab 11/29/21 0354 11/29/21 0434 11/29/21 0752 11/29/21 0851 11/29/21 1121  GLUCAP 64* 119* 63* 98 67*   Lipid Profile: No results for input(s): "CHOL", "HDL", "LDLCALC", "TRIG", "CHOLHDL", "LDLDIRECT" in the last 72 hours. Thyroid Function Tests: No results for input(s): "TSH", "T4TOTAL", "FREET4", "T3FREE", "THYROIDAB" in the last 72 hours. Anemia Panel: No results for input(s): "VITAMINB12", "FOLATE", "FERRITIN", "TIBC", "IRON", "RETICCTPCT" in the last 72 hours. Sepsis Labs: Recent Labs  Lab 11/28/21 1538 11/28/21 1812  LATICACIDVEN 2.0* 1.6    Recent Results (from the past 240 hour(s))  Blood Culture (routine x 2)     Status: None (Preliminary result)   Collection Time: 11/28/21  3:38 PM   Specimen: Right Antecubital; Blood  Result Value Ref Range Status   Specimen Description RIGHT ANTECUBITAL  Final   Special Requests   Final    BOTTLES DRAWN AEROBIC AND ANAEROBIC Blood Culture adequate volume    Culture  Setup Time   Final    GRAM NEGATIVE RODS IN BOTH AEROBIC AND ANAEROBIC BOTTLES Gram Stain Report Called to,Read Back By and Verified With: FOLEY B @ [id number] BY HENDERSON     Culture   Final    NO GROWTH < 24 HOURS Performed at [facility], [address] [place], [place] [address]    Report Status PENDING  Incomplete  Blood Culture (routine x 2)     Status: None (Preliminary result)   Collection Time: 11/28/21  3:43 PM   Specimen: Left Antecubital; Blood  Result Value Ref Range Status   Specimen Description LEFT ANTECUBITAL  Final   Special Requests   Final    BOTTLES DRAWN AEROBIC AND ANAEROBIC Blood Culture adequate volume   Culture  Setup Time   Final    GRAM NEGATIVE RODS IN BOTH AEROBIC AND ANAEROBIC BOTTLES Gram Stain Report Called to,Read Back By and Verified With: FOELY B @ 1303 ON [id number] BY HENDERSON L    Culture   Final    NO GROWTH < 24 HOURS Performed at [facility], [address], [place], [place] [address]    Report Status PENDING  Incomplete  MRSA Next Gen by PCR, Nasal  Status: Abnormal   Collection Time: 11/28/21  7:56 PM   Specimen: Nasal Mucosa; Nasal Swab  Result Value Ref Range Status   MRSA by PCR Next Gen DETECTED (A) NOT DETECTED Final    Comment: RESULT CALLED TO, READ BACK BY AND VERIFIED WITH: RONE,N ON 11/28/21 AT 2245 BY LOY,C (NOTE) The GeneXpert MRSA Assay (FDA approved for NASAL specimens only), is one component of a comprehensive MRSA colonization surveillance program. It is not intended to diagnose MRSA infection nor to guide or monitor treatment for MRSA infections. Test performance is not FDA approved in patients less than 78 years old. Performed at [facility], [address] [place], [place] [address]          Radiology Studies: CT ABDOMEN PELVIS WO CONTRAST  Result Date: 11/28/2021 CLINICAL DATA:  Flank pain, kidney stone suspected RENAL SEPSIS. Fall, near syncope, flank pain, kidney stone suspected, renal  sepsis. Pt unable to elaborate on pain or follow breathing instructions. EXAM: CT ABDOMEN AND PELVIS WITHOUT CONTRAST TECHNIQUE: Multidetector CT imaging of the abdomen and pelvis was performed following the standard protocol without IV contrast. RADIATION DOSE REDUCTION: This exam was performed according to the departmental dose-optimization program which includes automated exposure control, adjustment of the mA and/or kV according to patient size and/or use of iterative reconstruction technique. COMPARISON:  CT abdomen pelvis 01/18/2019 FINDINGS: Lower chest: Bilateral lower lobe subsegmental atelectasis. No acute abnormality. Hepatobiliary: Nodular hepatic contour. No focal liver abnormality. Status post cholecystectomy. No biliary dilatation. Pancreas: Diffusely atrophic. No focal lesion. Otherwise normal pancreatic contour. No surrounding inflammatory changes. No main pancreatic ductal dilatation. Spleen: Normal in size without focal abnormality. Splenule is noted. Adrenals/Urinary Tract: No adrenal nodule bilaterally. Nonspecific left perinephric fat stranding which is asymmetric compared to the right. No nephrolithiasis and no hydronephrosis. No definite contour-deforming renal mass. No ureterolithiasis or hydroureter. The urinary bladder is unremarkable. Stomach/Bowel: Stomach is within normal limits. No evidence of bowel wall thickening or dilatation. Scattered colonic diverticulosis most prominent along the sigmoid colon. Status post appendectomy. Vascular/Lymphatic: No abdominal aorta or iliac aneurysm. Mild atherosclerotic plaque of the aorta and its branches. No abdominal, pelvic, or inguinal lymphadenopathy. Reproductive: Status post hysterectomy. No adnexal masses. Other: Nonspecific trace simple free fluid within the pelvis. No intraperitoneal free gas. No organized fluid collection. Musculoskeletal: No abdominal wall hernia or abnormality. No suspicious lytic or blastic osseous lesions. No acute  displaced fracture. IMPRESSION: 1. Nonspecific asymmetric left perinephric fat stranding. Markedly limited evaluation on this noncontrast study. Correlate with urinalysis for underlying infection. 2. Colonic diverticulosis with no acute diverticulitis. 3. Cirrhosis. No definite findings of portal hypertension. No focal liver lesions identified. Please note that liver protocol enhanced MR and CT are the most sensitive tests for the screening detection of hepatocellular carcinoma in the high risk setting of cirrhosis. 4.  Aortic Atherosclerosis (ICD10-I70.0). Electronically Signed   By: Tish Frederickson M.D.   On: 11/28/2021 19:18   DG Chest Port 1 View  Result Date: 11/28/2021 CLINICAL DATA:  Sepsis. EXAM: PORTABLE CHEST 1 VIEW COMPARISON:  Chest radiograph 10/31/2021 FINDINGS: Stable cardiac and mediastinal contours. Low lung volume exam. Basilar atelectasis. No pleural effusion or pneumothorax. IMPRESSION: No active disease. Electronically Signed   By: Annia Belt M.D.   On: 11/28/2021 15:49   CT Head Wo Contrast  Result Date: 11/28/2021 CLINICAL DATA:  Near syncope, fall, EXAM: CT HEAD WITHOUT CONTRAST TECHNIQUE: Contiguous axial images were obtained from the base of the skull through the vertex without  intravenous contrast. RADIATION DOSE REDUCTION: This exam was performed according to the departmental dose-optimization program which includes automated exposure control, adjustment of the mA and/or kV according to patient size and/or use of iterative reconstruction technique. COMPARISON:  04/06/2020 FINDINGS: Brain: No acute intracranial findings are seen. There are no signs of bleeding within the cranium. Ventricles are not dilated. There are no epidural or subdural fluid collections. There is no focal edema or mass effect. Vascular: Unremarkable Skull: Unremarkable. Sinuses/Orbits: Unremarkable. Other: None. IMPRESSION: No acute intracranial findings are seen in noncontrast CT brain. Electronically Signed    By: Ernie Avena M.D.   On: 11/28/2021 15:32        Scheduled Meds:  baclofen  20 mg Oral TID   Chlorhexidine Gluconate Cloth  6 each Topical Q0600   dextrose       gabapentin  800 mg Oral TID   heparin  5,000 Units Subcutaneous Q8H   insulin aspart  0-9 Units Subcutaneous Q4H   insulin glargine-yfgn  10 Units Subcutaneous Daily   metoprolol tartrate  25 mg Oral BID   mupirocin ointment  1 Application Nasal BID   Continuous Infusions:  ceFEPime (MAXIPIME) IV 2 g (11/29/21 0539)   dextrose 75 mL/hr at 11/29/21 1148   famotidine (PEPCID) IV 20 mg (11/28/21 2036)     LOS: 1 day    Time spent:    Erick Blinks, MD [facility]   If 7PM-7AM, please contact night-coverage [email or web address]  11/29/2021, 2:22 PM

## 2021-11-29 NOTE — Plan of Care (Signed)
Patient remains in intensive care unit. D10W @ 75 mL/hr via PIV. 2 L/min of supplemental O2 via [place]. No acute distress.   Problem: Fluid Volume: Goal: Hemodynamic stability will improve Outcome: Progressing   Problem: Clinical Measurements: Goal: Diagnostic test results will improve Outcome: Progressing Goal: Signs and symptoms of infection will decrease Outcome: Progressing   Problem: Respiratory: Goal: Ability to maintain adequate ventilation will improve Outcome: Progressing   Problem: Education: Goal: Ability to describe self-care measures that may prevent or decrease complications (Diabetes Survival Skills Education) will improve Outcome: Progressing Goal: Individualized Educational Video(s) Outcome: Progressing   Problem: Coping: Goal: Ability to adjust to condition or change in health will improve Outcome: Progressing   Problem: Fluid Volume: Goal: Ability to maintain a balanced intake and output will improve Outcome: Progressing   Problem: Health Behavior/Discharge Planning: Goal: Ability to identify and utilize available resources and services will improve Outcome: Progressing Goal: Ability to manage health-related needs will improve Outcome: Progressing   Problem: Metabolic: Goal: Ability to maintain appropriate glucose levels will improve Outcome: Progressing   Problem: Nutritional: Goal: Maintenance of adequate nutrition will improve Outcome: Progressing Goal: Progress toward achieving an optimal weight will improve Outcome: Progressing   Problem: Skin Integrity: Goal: Risk for impaired skin integrity will decrease Outcome: Progressing   Problem: Tissue Perfusion: Goal: Adequacy of tissue perfusion will improve Outcome: Progressing   Problem: Education: Goal: Knowledge of General Education information will improve Description: Including pain rating scale, medication(s)/side effects and non-pharmacologic comfort measures Outcome: Progressing    Problem: Health Behavior/Discharge Planning: Goal: Ability to manage health-related needs will improve Outcome: Progressing   Problem: Clinical Measurements: Goal: Ability to maintain clinical measurements within normal limits will improve Outcome: Progressing Goal: Will remain free from infection Outcome: Progressing Goal: Diagnostic test results will improve Outcome: Progressing Goal: Respiratory complications will improve Outcome: Progressing Goal: Cardiovascular complication will be avoided Outcome: Progressing   Problem: Activity: Goal: Risk for activity intolerance will decrease Outcome: Progressing   Problem: Nutrition: Goal: Adequate nutrition will be maintained Outcome: Progressing   Problem: Coping: Goal: Level of anxiety will decrease Outcome: Progressing   Problem: Elimination: Goal: Will not experience complications related to bowel motility Outcome: Progressing Goal: Will not experience complications related to urinary retention Outcome: Progressing   Problem: Pain Managment: Goal: General experience of comfort will improve Outcome: Progressing   Problem: Safety: Goal: Ability to remain free from injury will improve Outcome: Progressing   Problem: Skin Integrity: Goal: Risk for impaired skin integrity will decrease Outcome: Progressing

## 2021-11-29 NOTE — Progress Notes (Signed)
Hypoglycemic Event  CBG: 64  Treatment: D50 25 mL (12.5 gm)  Symptoms: None  Follow-up CBG: Time:0427 CBG Result:119  Possible Reasons for Event: Inadequate meal intake and Medication regimen:  Comments/MD notified:DO. Zierle    [place]

## 2021-11-30 DIAGNOSIS — N179 Acute kidney failure, unspecified: Secondary | ICD-10-CM | POA: Diagnosis not present

## 2021-11-30 DIAGNOSIS — A419 Sepsis, unspecified organism: Secondary | ICD-10-CM | POA: Diagnosis not present

## 2021-11-30 DIAGNOSIS — R7881 Bacteremia: Secondary | ICD-10-CM | POA: Diagnosis not present

## 2021-11-30 DIAGNOSIS — I10 Essential (primary) hypertension: Secondary | ICD-10-CM | POA: Diagnosis not present

## 2021-11-30 LAB — GLUCOSE, CAPILLARY
Glucose-Capillary: 110 mg/dL — ABNORMAL HIGH (ref 70–99)
Glucose-Capillary: 118 mg/dL — ABNORMAL HIGH (ref 70–99)
Glucose-Capillary: 123 mg/dL — ABNORMAL HIGH (ref 70–99)
Glucose-Capillary: 147 mg/dL — ABNORMAL HIGH (ref 70–99)
Glucose-Capillary: 173 mg/dL — ABNORMAL HIGH (ref 70–99)
Glucose-Capillary: 85 mg/dL (ref 70–99)

## 2021-11-30 LAB — BASIC METABOLIC PANEL
Anion gap: 6 (ref 5–15)
BUN: 16 mg/dL (ref 6–20)
CO2: 24 mmol/L (ref 22–32)
Calcium: 7.6 mg/dL — ABNORMAL LOW (ref 8.9–10.3)
Chloride: 105 mmol/L (ref 98–111)
Creatinine, Ser: 0.97 mg/dL (ref 0.44–1.00)
GFR, Estimated: >60 mL/min (ref >60)
Glucose, Bld: 139 mg/dL — ABNORMAL HIGH (ref 70–99)
Potassium: 3.1 mmol/L — ABNORMAL LOW (ref 3.5–5.1)
Sodium: 135 mmol/L (ref 135–145)

## 2021-11-30 LAB — CBC
HCT: 26.7 % — ABNORMAL LOW (ref 36.0–46.0)
Hemoglobin: 8.4 g/dL — ABNORMAL LOW (ref 12.0–15.0)
MCH: 24.3 pg — ABNORMAL LOW (ref 26.0–34.0)
MCHC: 31.5 g/dL (ref 30.0–36.0)
MCV: 77.2 fL — ABNORMAL LOW (ref 80.0–100.0)
Platelets: 166 10*3/uL (ref 150–400)
RBC: 3.46 MIL/uL — ABNORMAL LOW (ref 3.87–5.11)
RDW: 14.9 % (ref 11.5–15.5)
WBC: 7.6 10*3/uL (ref 4.0–10.5)
nRBC: 0 % (ref 0.0–0.2)

## 2021-11-30 LAB — MAGNESIUM: Magnesium: 1.8 mg/dL (ref 1.7–2.4)

## 2021-11-30 MED ORDER — POTASSIUM CHLORIDE CRYS ER 20 MEQ PO TBCR
40.0000 meq | EXTENDED_RELEASE_TABLET | ORAL | Status: AC
  Administered 2021-11-30 (×2): 40 meq via ORAL
  Filled 2021-11-30 (×2): qty 2

## 2021-11-30 NOTE — Progress Notes (Signed)
   11/30/21 1044  Assess: MEWS Score  Temp (!) 103 F (39.4 C)  BP (!) 154/68  MAP (mmHg) 94  Pulse Rate (!) 103  Resp 16  SpO2 97 %  O2 Device Room Air  Assess: MEWS Score  MEWS Temp 2  MEWS Systolic 0  MEWS Pulse 1  MEWS RR 0  MEWS LOC 0  MEWS Score 3  MEWS Score Color Yellow  Assess: if the MEWS score is Yellow or Red  Were vital signs taken at a resting state? Yes  Does the patient meet 2 or more of the SIRS criteria? Yes  Does the patient have a confirmed or suspected source of infection? Yes  Provider and Rapid Response Notified? No  MEWS guidelines implemented *See Row Information* Yes  Treat  MEWS Interventions Administered prn meds/treatments (tylenol)  Take Vital Signs  Increase Vital Sign Frequency  Yellow: Q 2hr X 2 then Q 4hr X 2, if remains yellow, continue Q 4hrs  Escalate  MEWS: Escalate Yellow: discuss with charge nurse/RN and consider discussing with provider and RRT  Notify: Charge Nurse/RN  Name of Charge Nurse/RN Notified Lonn [place]  Date Charge Nurse/RN Notified 11/30/21  Time Charge Nurse/RN Notified 1050  Notify: Provider  Provider Name/Title Dr. Kerry Hough  Date Provider Notified 11/30/21  Time Provider Notified 1055  Method of Notification Face-to-face  Notification Reason Other (Comment) (Has been febrile intermittently.  Just transferred from ICU and wanted to let him know)  Provider response No new orders  Document  Patient Outcome Other (Comment) (will reaccess after tylenol)  Assess: SIRS CRITERIA  SIRS Temperature  1  SIRS Pulse 1  SIRS Respirations  0  SIRS WBC 1  SIRS Score Sum  3

## 2021-11-30 NOTE — Progress Notes (Signed)
PROGRESS NOTE    [name]  ZOX:[id number] DOB: 16-Feb-1966 DOA: 11/28/2021 PCP: Elfredia Nevins, MD    Brief Narrative:  56 year old female with history of multiple sclerosis, hypertension, hyperlipidemia, diabetes, presented to emergency room with falls, confusion.  Noted to be febrile, tachycardic.  Noted to have sepsis secondary to gram-negative bacteremia.  Felt to be related to possible pyelonephritis.  Started on broad-spectrum antibiotics.   Assessment & Plan:   Principal Problem:   Severe sepsis (HCC) Active Problems:   Esophageal reflux   HTN (hypertension)   Mixed hyperlipidemia   Multiple sclerosis (HCC)   Uncontrolled type 2 diabetes mellitus with hyperglycemia (HCC)   Fatty liver   Hypokalemia   AKI (acute kidney injury) (HCC)   LFT elevation   Gram-negative bacteremia   Severe sepsis secondary to gram-negative bacteremia, likely related to UTI -Patient admitted with tachypnea, tachycardia, elevated lactate, fever and altered mental status -Blood cultures positive for gram-negative rods, BC ID shows E. coli -CT indicated possible pyelonephritis, no obstructing stone identified -Continue IV antibiotics  Acute kidney injury -Secondary to sepsis -Avoid nephrotoxic agents -Started on IV fluids with improvement of renal function  GERD -Continue Pepcid  Acute metabolic encephalopathy -Likely related to sepsis and fever -Appears to be back to baseline  Diabetes, insulin-dependent, uncontrolled with hypoglycemia -Overall hypoglycemia is better -Tolerating liquids -Continue on sliding scale insulin  Nausea and vomiting -Suspect this is related to UTI -No bowel findings on CT -Treat supportively with antiemetics -Tolerating liquids, advance to solid food  History of multiple sclerosis -Continue on baclofen for spasms -Ammonia level is not elevated -Continue gabapentin for chronic pain  Hypokalemia -Replace  Hypertension -Continue  metoprolol -Holding ARB in light of renal issues   DVT prophylaxis: heparin injection 5,000 Units Start: 11/28/21 2200  Code Status: Full code Family Communication: Constipation Disposition Plan: Status is: Inpatient Remains inpatient appropriate because: Continued management with IV antibiotics for gram-negative sepsis     Consultants:    Procedures:    Antimicrobials:  Cefepime 9/1 >   Subjective: She says she is feeling better today.  She was able to tolerate some liquids without persistent vomiting.  Objective: Vitals:   11/30/21 0400 11/30/21 0500 11/30/21 0800 11/30/21 0900  BP: (!) 143/113 (!) 143/78 (!) 167/77 (!) 158/66  Pulse:   (!) 105 95  Resp: 13 15 (!) 24 18  Temp:      TempSrc:      SpO2:   100% 98%  Weight:      Height:        Intake/Output Summary (Last 24 hours) at 11/30/2021 1003 Last data filed at 11/30/2021 0800 Gross per 24 hour  Intake 1873.62 ml  Output 1350 ml  Net 523.62 ml   Filed Weights   11/28/21 1303 11/29/21 0430  Weight: 81.6 kg 92.2 kg    Examination:  General exam: Appears calm and comfortable  Respiratory system: Clear to auscultation. Respiratory effort normal. Cardiovascular system: S1 & S2 heard, RRR. No JVD, murmurs, rubs, gallops or clicks. No pedal edema. Gastrointestinal system: Abdomen is nondistended, soft and nontender. No organomegaly or masses felt. Normal bowel sounds heard. Central nervous system: Alert and oriented. No focal neurological deficits. Extremities: Symmetric 5 x 5 power. Skin: No rashes, lesions or ulcers Psychiatry: Judgement and insight appear normal. Mood & affect appropriate.     Data Reviewed: I have personally reviewed following labs and imaging studies  CBC: Recent Labs  Lab 11/28/21 1431 11/29/21 0347  WBC 21.5*  10.9*  HGB 10.6* 8.8*  HCT 34.7* 29.4*  MCV 79.6* 81.2  PLT 230 150   Basic Metabolic Panel: Recent Labs  Lab 11/28/21 1431 11/29/21 0347  NA 137 139  K 3.4*  3.8  CL 104 110  CO2 27 22  GLUCOSE 219* 67*  BUN 33* 29*  CREATININE 2.05* 1.33*  CALCIUM 8.2* 7.6*   GFR: Estimated Creatinine Clearance: 53 mL/min (A) (by C-G formula based on SCr of 1.33 mg/dL (H)). Liver Function Tests: Recent Labs  Lab 11/28/21 1431 11/29/21 0347  AST 39 28  ALT 23 18  ALKPHOS 129* 106  BILITOT 0.6 0.8  PROT 7.8 6.4*  ALBUMIN 3.0* 2.4*   No results for input(s): "LIPASE", "AMYLASE" in the last 168 hours. Recent Labs  Lab 11/29/21 0348  AMMONIA 16   Coagulation Profile: Recent Labs  Lab 11/28/21 1653  INR 1.2   Cardiac Enzymes: No results for input(s): "CKTOTAL", "CKMB", "CKMBINDEX", "TROPONINI" in the last 168 hours. BNP (last 3 results) No results for input(s): "PROBNP" in the last 8760 hours. HbA1C: Recent Labs    11/28/21 1653  HGBA1C 7.0*   CBG: Recent Labs  Lab 11/29/21 1616 11/29/21 2052 11/30/21 0047 11/30/21 0355 11/30/21 0737  GLUCAP 101* 189* 110* 173* 147*   Lipid Profile: No results for input(s): "CHOL", "HDL", "LDLCALC", "TRIG", "CHOLHDL", "LDLDIRECT" in the last 72 hours. Thyroid Function Tests: No results for input(s): "TSH", "T4TOTAL", "FREET4", "T3FREE", "THYROIDAB" in the last 72 hours. Anemia Panel: No results for input(s): "VITAMINB12", "FOLATE", "FERRITIN", "TIBC", "IRON", "RETICCTPCT" in the last 72 hours. Sepsis Labs: Recent Labs  Lab 11/28/21 1538 11/28/21 1812  LATICACIDVEN 2.0* 1.6    Recent Results (from the past 240 hour(s))  Blood Culture (routine x 2)     Status: Abnormal (Preliminary result)   Collection Time: 11/28/21  3:38 PM   Specimen: Right Antecubital; Blood  Result Value Ref Range Status   Specimen Description   Final    RIGHT ANTECUBITAL BLOOD Performed at [facility], [address] [place], [place] [address]    Special Requests   Final    BOTTLES DRAWN AEROBIC AND ANAEROBIC Blood Culture adequate volume Performed at [facility], [address] [place], [place]  [address]    Culture  Setup Time   Final    GRAM NEGATIVE RODS IN BOTH AEROBIC AND ANAEROBIC BOTTLES Gram Stain Report Called to,Read Back By and Verified With: FOLEY B @ 1303 [id number] BY HENDERSON  CRITICAL RESULT CALLED TO, READ BACK BY AND VERIFIED WITH: RN J HAYES 1857 FCP    Culture (A)  Final    ESCHERICHIA COLI SUSCEPTIBILITIES TO FOLLOW Performed at [facility], [address], [place], [place] [address]    Report Status PENDING  Incomplete  Urine Culture     Status: Abnormal (Preliminary result)   Collection Time: 11/28/21  3:38 PM   Specimen: Urine, Catheterized  Result Value Ref Range Status   Specimen Description   Final    URINE, CATHETERIZED Performed at [facility], [address] [place], [place] [address]    Special Requests   Final    NONE Performed at [facility], [address], [place], [place] [address]    Culture >=100,000 COLONIES/mL GRAM NEGATIVE RODS (A)  Final   Report Status PENDING  Incomplete  Blood Culture ID Panel (Reflexed)     Status: Abnormal   Collection Time: 11/28/21  3:38 PM  Result Value Ref Range Status  Enterococcus faecalis NOT DETECTED NOT DETECTED Final   Enterococcus Faecium NOT DETECTED NOT DETECTED Final   Listeria monocytogenes NOT DETECTED NOT DETECTED Final   Staphylococcus species NOT DETECTED NOT DETECTED Final   Staphylococcus aureus (BCID) NOT DETECTED NOT DETECTED Final   Staphylococcus epidermidis NOT DETECTED NOT DETECTED Final   Staphylococcus lugdunensis NOT DETECTED NOT DETECTED Final   Streptococcus species NOT DETECTED NOT DETECTED Final   Streptococcus agalactiae NOT DETECTED NOT DETECTED Final   Streptococcus pneumoniae NOT DETECTED NOT DETECTED Final   Streptococcus pyogenes NOT DETECTED NOT DETECTED Final   A.calcoaceticus-baumannii NOT DETECTED NOT DETECTED Final   Bacteroides fragilis NOT DETECTED NOT DETECTED Final   Enterobacterales DETECTED (A) NOT DETECTED Final    Comment: Enterobacterales  represent a large order of gram negative bacteria, not a single organism. CRITICAL RESULT CALLED TO, READ BACK BY AND VERIFIED WITH: RN J HAYES 1857 FCP    Enterobacter cloacae complex NOT DETECTED NOT DETECTED Final   Escherichia coli DETECTED (A) NOT DETECTED Final    Comment: CRITICAL RESULT CALLED TO, READ BACK BY AND VERIFIED WITH: RN J HAYES 1857 FCP    Klebsiella aerogenes NOT DETECTED NOT DETECTED Final   Klebsiella oxytoca NOT DETECTED NOT DETECTED Final   Klebsiella pneumoniae NOT DETECTED NOT DETECTED Final   Proteus species NOT DETECTED NOT DETECTED Final   Salmonella species NOT DETECTED NOT DETECTED Final   Serratia marcescens NOT DETECTED NOT DETECTED Final   Haemophilus influenzae NOT DETECTED NOT DETECTED Final   Neisseria meningitidis NOT DETECTED NOT DETECTED Final   Pseudomonas aeruginosa NOT DETECTED NOT DETECTED Final   Stenotrophomonas maltophilia NOT DETECTED NOT DETECTED Final   Candida albicans NOT DETECTED NOT DETECTED Final   Candida auris NOT DETECTED NOT DETECTED Final   Candida glabrata NOT DETECTED NOT DETECTED Final   Candida krusei NOT DETECTED NOT DETECTED Final   Candida parapsilosis NOT DETECTED NOT DETECTED Final   Candida tropicalis NOT DETECTED NOT DETECTED Final   Cryptococcus neoformans/gattii NOT DETECTED NOT DETECTED Final   CTX-M ESBL NOT DETECTED NOT DETECTED Final   Carbapenem resistance IMP NOT DETECTED NOT DETECTED Final   Carbapenem resistance KPC NOT DETECTED NOT DETECTED Final   Carbapenem resistance NDM NOT DETECTED NOT DETECTED Final   Carbapenem resist OXA 48 LIKE NOT DETECTED NOT DETECTED Final   Carbapenem resistance VIM NOT DETECTED NOT DETECTED Final    Comment: Performed at [facility], [address] [place], [place] [address]  Blood Culture (routine x 2)     Status: None (Preliminary result)   Collection Time: 11/28/21  3:43 PM   Specimen: Left Antecubital; Blood  Result Value Ref Range Status   Specimen  Description   Final    LEFT ANTECUBITAL BLOOD Performed at [facility], [address] [place], [place] [address]    Special Requests   Final    BOTTLES DRAWN AEROBIC AND ANAEROBIC Blood Culture adequate volume Performed at [facility], [address] [place], [place] [address]    Culture  Setup Time   Final    GRAM NEGATIVE RODS IN BOTH AEROBIC AND ANAEROBIC BOTTLES Gram Stain Report Called to,Read Back By and Verified With: FOELY B @ 1303 ON [id number] BY HENDERSON L CRITICAL VALUE NOTED.  VALUE IS CONSISTENT WITH PREVIOUSLY REPORTED AND CALLED VALUE.    Culture   Final    GRAM NEGATIVE RODS IDENTIFICATION TO FOLLOW Performed at [facility], [address], [place],  [place] [address]    Report Status PENDING  Incomplete  MRSA Next Gen by PCR, Nasal     Status: Abnormal   Collection Time: 11/28/21  7:56 PM   Specimen: Nasal Mucosa; Nasal Swab  Result Value Ref Range Status   MRSA by PCR Next Gen DETECTED (A) NOT DETECTED Final    Comment: RESULT CALLED TO, READ BACK BY AND VERIFIED WITH: RONE,N ON 11/28/21 AT 2245 BY LOY,C (NOTE) The GeneXpert MRSA Assay (FDA approved for NASAL specimens only), is one component of a comprehensive MRSA colonization surveillance program. It is not intended to diagnose MRSA infection nor to guide or monitor treatment for MRSA infections. Test performance is not FDA approved in patients less than 21 years old. Performed at [facility], [address], [place], [place] [address]          Radiology Studies: CT ABDOMEN PELVIS WO CONTRAST  Result Date: 11/28/2021 CLINICAL DATA:  Flank pain, kidney stone suspected RENAL SEPSIS. Fall, near syncope, flank pain, kidney stone suspected, renal sepsis. Pt unable to elaborate on pain or follow breathing instructions. EXAM: CT ABDOMEN AND PELVIS WITHOUT CONTRAST TECHNIQUE: Multidetector CT imaging of the abdomen and pelvis was performed following the standard protocol without IV contrast. RADIATION  DOSE REDUCTION: This exam was performed according to the departmental dose-optimization program which includes automated exposure control, adjustment of the mA and/or kV according to patient size and/or use of iterative reconstruction technique. COMPARISON:  CT abdomen pelvis 01/18/2019 FINDINGS: Lower chest: Bilateral lower lobe subsegmental atelectasis. No acute abnormality. Hepatobiliary: Nodular hepatic contour. No focal liver abnormality. Status post cholecystectomy. No biliary dilatation. Pancreas: Diffusely atrophic. No focal lesion. Otherwise normal pancreatic contour. No surrounding inflammatory changes. No main pancreatic ductal dilatation. Spleen: Normal in size without focal abnormality. Splenule is noted. Adrenals/Urinary Tract: No adrenal nodule bilaterally. Nonspecific left perinephric fat stranding which is asymmetric compared to the right. No nephrolithiasis and no hydronephrosis. No definite contour-deforming renal mass. No ureterolithiasis or hydroureter. The urinary bladder is unremarkable. Stomach/Bowel: Stomach is within normal limits. No evidence of bowel wall thickening or dilatation. Scattered colonic diverticulosis most prominent along the sigmoid colon. Status post appendectomy. Vascular/Lymphatic: No abdominal aorta or iliac aneurysm. Mild atherosclerotic plaque of the aorta and its branches. No abdominal, pelvic, or inguinal lymphadenopathy. Reproductive: Status post hysterectomy. No adnexal masses. Other: Nonspecific trace simple free fluid within the pelvis. No intraperitoneal free gas. No organized fluid collection. Musculoskeletal: No abdominal wall hernia or abnormality. No suspicious lytic or blastic osseous lesions. No acute displaced fracture. IMPRESSION: 1. Nonspecific asymmetric left perinephric fat stranding. Markedly limited evaluation on this noncontrast study. Correlate with urinalysis for underlying infection. 2. Colonic diverticulosis with no acute diverticulitis. 3.  Cirrhosis. No definite findings of portal hypertension. No focal liver lesions identified. Please note that liver protocol enhanced MR and CT are the most sensitive tests for the screening detection of hepatocellular carcinoma in the high risk setting of cirrhosis. 4.  Aortic Atherosclerosis (ICD10-I70.0). Electronically Signed   By: Tish Frederickson M.D.   On: 11/28/2021 19:18   DG Chest Port 1 View  Result Date: 11/28/2021 CLINICAL DATA:  Sepsis. EXAM: PORTABLE CHEST 1 VIEW COMPARISON:  Chest radiograph 10/31/2021 FINDINGS: Stable cardiac and mediastinal contours. Low lung volume exam. Basilar atelectasis. No pleural effusion or pneumothorax. IMPRESSION: No active disease. Electronically Signed   By: Annia Belt M.D.   On: 11/28/2021 15:49   CT Head Wo Contrast  Result Date: 11/28/2021 CLINICAL DATA:  Near syncope, fall,  EXAM: CT HEAD WITHOUT CONTRAST TECHNIQUE: Contiguous axial images were obtained from the base of the skull through the vertex without intravenous contrast. RADIATION DOSE REDUCTION: This exam was performed according to the departmental dose-optimization program which includes automated exposure control, adjustment of the mA and/or kV according to patient size and/or use of iterative reconstruction technique. COMPARISON:  04/06/2020 FINDINGS: Brain: No acute intracranial findings are seen. There are no signs of bleeding within the cranium. Ventricles are not dilated. There are no epidural or subdural fluid collections. There is no focal edema or mass effect. Vascular: Unremarkable Skull: Unremarkable. Sinuses/Orbits: Unremarkable. Other: None. IMPRESSION: No acute intracranial findings are seen in noncontrast CT brain. Electronically Signed   By: Ernie Avena M.D.   On: 11/28/2021 15:32        Scheduled Meds:  baclofen  20 mg Oral TID   Chlorhexidine Gluconate Cloth  6 each Topical Q0600   gabapentin  800 mg Oral TID   heparin  5,000 Units Subcutaneous Q8H   insulin aspart   0-9 Units Subcutaneous Q4H   insulin glargine-yfgn  10 Units Subcutaneous Daily   metoprolol tartrate  25 mg Oral BID   mupirocin ointment  1 Application Nasal BID   Continuous Infusions:  ceFEPime (MAXIPIME) IV Stopped (11/30/21 6578)   famotidine (PEPCID) IV Stopped (11/29/21 1719)     LOS: 2 days    Time spent:    Erick Blinks, MD [facility]   If 7PM-7AM, please contact night-coverage [email or web address]  11/30/2021, 10:03 AM

## 2021-11-30 NOTE — Progress Notes (Signed)
   11/30/21 1241  Assess: MEWS Score  Temp 98.9 F (37.2 C)  BP 129/65  MAP (mmHg) 84  Pulse Rate 93  Resp 18  SpO2 92 %  O2 Device Room Air  Assess: MEWS Score  MEWS Temp 0  MEWS Systolic 0  MEWS Pulse 0  MEWS RR 0  MEWS LOC 0  MEWS Score 0  MEWS Score Color Green  Assess: if the MEWS score is Yellow or Red  Were vital signs taken at a resting state? Yes  Document  Patient Outcome Stabilized after interventions  Progress note created (see row info) Yes  Assess: SIRS CRITERIA  SIRS Temperature  0  SIRS Pulse 1  SIRS Respirations  0  SIRS WBC 1  SIRS Score Sum  2

## 2021-11-30 NOTE — Progress Notes (Signed)
Transferred from ICU earlier today and temp was 103.  Talked with Dr. Kerry Hough and gave tylenol and requested zofran. Temp now 98.9.  Ate most of lunch.  Ambulated with standby to bathroom and did ok. Bed alarm set and knows to call for assistance.

## 2021-11-30 NOTE — Progress Notes (Signed)
Ambulated independently to bathroom and now at sink bathing self after setup. Voided and had bm

## 2021-12-01 DIAGNOSIS — I10 Essential (primary) hypertension: Secondary | ICD-10-CM | POA: Diagnosis not present

## 2021-12-01 DIAGNOSIS — N179 Acute kidney failure, unspecified: Secondary | ICD-10-CM | POA: Diagnosis not present

## 2021-12-01 DIAGNOSIS — R7881 Bacteremia: Secondary | ICD-10-CM | POA: Diagnosis not present

## 2021-12-01 DIAGNOSIS — A419 Sepsis, unspecified organism: Secondary | ICD-10-CM | POA: Diagnosis not present

## 2021-12-01 LAB — GLUCOSE, CAPILLARY
Glucose-Capillary: 108 mg/dL — ABNORMAL HIGH (ref 70–99)
Glucose-Capillary: 122 mg/dL — ABNORMAL HIGH (ref 70–99)
Glucose-Capillary: 123 mg/dL — ABNORMAL HIGH (ref 70–99)
Glucose-Capillary: 131 mg/dL — ABNORMAL HIGH (ref 70–99)
Glucose-Capillary: 135 mg/dL — ABNORMAL HIGH (ref 70–99)
Glucose-Capillary: 140 mg/dL — ABNORMAL HIGH (ref 70–99)
Glucose-Capillary: 90 mg/dL (ref 70–99)

## 2021-12-01 LAB — CBC
HCT: 27.6 % — ABNORMAL LOW (ref 36.0–46.0)
Hemoglobin: 8.6 g/dL — ABNORMAL LOW (ref 12.0–15.0)
MCH: 24.4 pg — ABNORMAL LOW (ref 26.0–34.0)
MCHC: 31.2 g/dL (ref 30.0–36.0)
MCV: 78.2 fL — ABNORMAL LOW (ref 80.0–100.0)
Platelets: 173 10*3/uL (ref 150–400)
RBC: 3.53 MIL/uL — ABNORMAL LOW (ref 3.87–5.11)
RDW: 15.2 % (ref 11.5–15.5)
WBC: 6.3 10*3/uL (ref 4.0–10.5)
nRBC: 0 % (ref 0.0–0.2)

## 2021-12-01 LAB — URINE CULTURE: Culture: >=100000 — AB

## 2021-12-01 LAB — BASIC METABOLIC PANEL
Anion gap: 4 — ABNORMAL LOW (ref 5–15)
BUN: 14 mg/dL (ref 6–20)
CO2: 26 mmol/L (ref 22–32)
Calcium: 7.7 mg/dL — ABNORMAL LOW (ref 8.9–10.3)
Chloride: 107 mmol/L (ref 98–111)
Creatinine, Ser: 0.87 mg/dL (ref 0.44–1.00)
GFR, Estimated: >60 mL/min (ref >60)
Glucose, Bld: 149 mg/dL — ABNORMAL HIGH (ref 70–99)
Potassium: 3.1 mmol/L — ABNORMAL LOW (ref 3.5–5.1)
Sodium: 137 mmol/L (ref 135–145)

## 2021-12-01 LAB — CULTURE, BLOOD (ROUTINE X 2)
Special Requests: ADEQUATE
Special Requests: ADEQUATE

## 2021-12-01 MED ORDER — POTASSIUM CHLORIDE CRYS ER 20 MEQ PO TBCR
40.0000 meq | EXTENDED_RELEASE_TABLET | ORAL | Status: AC
  Administered 2021-12-01 (×3): 40 meq via ORAL
  Filled 2021-12-01 (×3): qty 2

## 2021-12-01 MED ORDER — CEFTRIAXONE SODIUM 2 G IJ SOLR
2.0000 g | INTRAMUSCULAR | Status: DC
  Administered 2021-12-01: 2 g via INTRAVENOUS
  Filled 2021-12-01: qty 20

## 2021-12-01 NOTE — TOC Progression Note (Signed)
  Transition of Care (TOC) Screening Note   Patient Details  Name: [name] Date of Birth: July 13, 1965   Transition of Care [facility]) CM/SW Contact:    Leitha Bleak, RN Phone Number: 12/01/2021, 10:52 AM    Transition of Care Department [facility]) has reviewed patient and no TOC needs have been identified at this time. We will continue to monitor patient advancement through interdisciplinary progression rounds. If new patient transition needs arise, please place a TOC consult.     Expected Discharge Plan: Home/Self Care Barriers to Discharge: Continued Medical Work up  Expected Discharge Plan and Services Expected Discharge Plan: Home/Self Care      Living arrangements for the past 2 months: Single Family Home           Readmission Risk Interventions     No data to display

## 2021-12-01 NOTE — Progress Notes (Signed)
PROGRESS NOTE    [name]  [id number] DOB: 13-Sep-1965 DOA: 11/28/2021 PCP: Redmond School, MD    Brief Narrative:  56 year old female with history of multiple sclerosis, hypertension, hyperlipidemia, diabetes, presented to emergency room with falls, confusion.  Noted to be febrile, tachycardic.  Noted to have sepsis secondary to gram-negative bacteremia.  Felt to be related to possible pyelonephritis.  Started on broad-spectrum antibiotics.   Assessment & Plan:   Principal Problem:   Severe sepsis ([place]) Active Problems:   Esophageal reflux   HTN (hypertension)   Mixed hyperlipidemia   Multiple sclerosis ([facility])   Uncontrolled type 2 diabetes mellitus with hyperglycemia ([facility])   Fatty liver   Hypokalemia   AKI (acute kidney injury) ([place])   LFT elevation   Gram-negative bacteremia   Severe sepsis secondary to E. coli bacteremia, secondary to E. coli UTI -Patient admitted with tachypnea, tachycardia, elevated lactate, fever and altered mental status -Blood cultures positive for E. coli, urine culture also positive for E. coli -CT indicated possible pyelonephritis, no obstructing stone identified -She has been on cefepime, will de-escalate to ceftriaxone -Anticipate transitioning to oral antibiotics tomorrow if she remains afebrile  Acute kidney injury -Secondary to sepsis -Avoid nephrotoxic agents -Started on IV fluids with improvement of renal function  GERD -Continue Pepcid  Acute metabolic encephalopathy -Likely related to sepsis and fever -Appears to be back to baseline  Diabetes, insulin-dependent, uncontrolled with hypoglycemia -Overall hypoglycemia is better -Tolerating liquids -Continue on sliding scale insulin  Nausea and vomiting -Suspect this is related to UTI -No bowel findings on CT -Treat supportively with antiemetics -Tolerating liquids, advanced to solid food  History of multiple sclerosis -Continue on baclofen for spasms -Ammonia level is  not elevated -Continue gabapentin for chronic pain  Hypokalemia -Replace  Hypertension -Continue metoprolol -Holding ARB in light of renal issues   DVT prophylaxis: heparin injection 5,000 Units Start: 11/28/21 2200  Code Status: Full code Family Communication: Constipation Disposition Plan: Status is: Inpatient Remains inpatient appropriate because: Continued management with IV antibiotics for E. coli sepsis     Consultants:    Procedures:    Antimicrobials:  Cefepime 9/1 >9/4 Ceftriaxone 9/4>   Subjective: She is feeling better today.  Noted to have high fever up to 103 last night.  Objective: Vitals:   11/30/21 1840 11/30/21 2122 12/01/21 0323 12/01/21 0918  BP: (!) 167/82 (!) 144/76 (!) 150/81 (!) 145/81  Pulse: (!) 103 (!) 102 96 86  Resp: 16  18   Temp: 100.1 F (37.8 C)  98.8 F (37.1 C)   TempSrc: Oral  Oral   SpO2: 95%  98%   Weight:      Height:        Intake/Output Summary (Last 24 hours) at 12/01/2021 1241 Last data filed at 12/01/2021 0659 Gross per 24 hour  Intake 1442.86 ml  Output --  Net 1442.86 ml   Filed Weights   11/28/21 1303 11/29/21 0430  Weight: 81.6 kg 92.2 kg    Examination:  General exam: Appears calm and comfortable  Respiratory system: Clear to auscultation. Respiratory effort normal. Cardiovascular system: S1 & S2 heard, RRR. No JVD, murmurs, rubs, gallops or clicks. No pedal edema. Gastrointestinal system: Abdomen is nondistended, soft and nontender. No organomegaly or masses felt. Normal bowel sounds heard. Central nervous system: Alert and oriented. No focal neurological deficits. Extremities: Symmetric 5 x 5 power. Skin: No rashes, lesions or ulcers Psychiatry: Judgement and insight appear normal. Mood & affect appropriate.  Data Reviewed: I have personally reviewed following labs and imaging studies  CBC: Recent Labs  Lab 11/28/21 1431 11/29/21 0347 11/30/21 1024 12/01/21 0435  WBC 21.5* 10.9* 7.6  6.3  HGB 10.6* 8.8* 8.4* 8.6*  HCT 34.7* 29.4* 26.7* 27.6*  MCV 79.6* 81.2 77.2* 78.2*  PLT 230 150 166 173   Basic Metabolic Panel: Recent Labs  Lab 11/28/21 1431 11/29/21 0347 11/30/21 1024 12/01/21 0435  NA 137 139 135 137  K 3.4* 3.8 3.1* 3.1*  CL 104 110 105 107  CO2 27 22 24 26   GLUCOSE 219* 67* 139* 149*  BUN 33* 29* 16 14  CREATININE 2.05* 1.33* 0.97 0.87  CALCIUM 8.2* 7.6* 7.6* 7.7*  MG  --   --  1.8  --    GFR: Estimated Creatinine Clearance: 81 mL/min (by C-G formula based on SCr of 0.87 mg/dL). Liver Function Tests: Recent Labs  Lab 11/28/21 1431 11/29/21 0347  AST 39 28  ALT 23 18  ALKPHOS 129* 106  BILITOT 0.6 0.8  PROT 7.8 6.4*  ALBUMIN 3.0* 2.4*   No results for input(s): "LIPASE", "AMYLASE" in the last 168 hours. Recent Labs  Lab 11/29/21 0348  AMMONIA 16   Coagulation Profile: Recent Labs  Lab 11/28/21 1653  INR 1.2   Cardiac Enzymes: No results for input(s): "CKTOTAL", "CKMB", "CKMBINDEX", "TROPONINI" in the last 168 hours. BNP (last 3 results) No results for input(s): "PROBNP" in the last 8760 hours. HbA1C: Recent Labs    11/28/21 1653  HGBA1C 7.0*   CBG: Recent Labs  Lab 11/30/21 2012 12/01/21 0046 12/01/21 0439 12/01/21 0746 12/01/21 1145  GLUCAP 118* 90 140* 108* 123*   Lipid Profile: No results for input(s): "CHOL", "HDL", "LDLCALC", "TRIG", "CHOLHDL", "LDLDIRECT" in the last 72 hours. Thyroid Function Tests: No results for input(s): "TSH", "T4TOTAL", "FREET4", "T3FREE", "THYROIDAB" in the last 72 hours. Anemia Panel: No results for input(s): "VITAMINB12", "FOLATE", "FERRITIN", "TIBC", "IRON", "RETICCTPCT" in the last 72 hours. Sepsis Labs: Recent Labs  Lab 11/28/21 1538 11/28/21 1812  LATICACIDVEN 2.0* 1.6    Recent Results (from the past 240 hour(s))  Blood Culture (routine x 2)     Status: Abnormal   Collection Time: 11/28/21  3:38 PM   Specimen: Right Antecubital; Blood  Result Value Ref Range Status    Specimen Description   Final    RIGHT ANTECUBITAL BLOOD Performed at [facility], [address] [place], [place]    Special Requests   Final    BOTTLES DRAWN AEROBIC AND ANAEROBIC Blood Culture adequate volume Performed at [facility], [address] [place], [place]    Culture  Setup Time   Final    GRAM NEGATIVE RODS IN BOTH AEROBIC AND ANAEROBIC BOTTLES Gram Stain Report Called to,Read Back By and Verified With: FOLEY B @ [id number] BY HENDERSON  CRITICAL RESULT CALLED TO, READ BACK BY AND VERIFIED WITH: RN J HAYES 1857 FCP Performed at [facility], [address], [place], [place]    Culture ESCHERICHIA COLI (A)  Final   Report Status 12/01/2021 FINAL  Final   Organism ID, Bacteria ESCHERICHIA COLI  Final      Susceptibility   Escherichia coli - MIC*    AMPICILLIN <=2 SENSITIVE Sensitive     CEFAZOLIN <=4 SENSITIVE Sensitive     CEFEPIME <=0.12 SENSITIVE Sensitive     CEFTAZIDIME <=1 SENSITIVE Sensitive     CEFTRIAXONE <=0.25 SENSITIVE Sensitive  CIPROFLOXACIN <=0.25 SENSITIVE Sensitive     GENTAMICIN <=1 SENSITIVE Sensitive     IMIPENEM <=0.25 SENSITIVE Sensitive     TRIMETH/SULFA <=20 SENSITIVE Sensitive     AMPICILLIN/SULBACTAM <=2 SENSITIVE Sensitive     PIP/TAZO <=4 SENSITIVE Sensitive     * ESCHERICHIA COLI  Urine Culture     Status: Abnormal   Collection Time: 11/28/21  3:38 PM   Specimen: Urine, Catheterized  Result Value Ref Range Status   Specimen Description   Final    URINE, CATHETERIZED Performed at [facility], [address], [place], [place] [address]    Special Requests   Final    NONE Performed at [facility], [address], [place], [place] [address]    Culture >=100,000 COLONIES/mL ESCHERICHIA COLI (A)  Final   Report Status 12/01/2021 FINAL  Final   Organism ID, Bacteria ESCHERICHIA COLI (A)  Final      Susceptibility   Escherichia coli - MIC*    AMPICILLIN <=2 SENSITIVE Sensitive      CEFAZOLIN <=4 SENSITIVE Sensitive     CEFEPIME <=0.12 SENSITIVE Sensitive     CEFTRIAXONE <=0.25 SENSITIVE Sensitive     CIPROFLOXACIN <=0.25 SENSITIVE Sensitive     GENTAMICIN <=1 SENSITIVE Sensitive     IMIPENEM <=0.25 SENSITIVE Sensitive     NITROFURANTOIN <=16 SENSITIVE Sensitive     TRIMETH/SULFA <=20 SENSITIVE Sensitive     AMPICILLIN/SULBACTAM <=2 SENSITIVE Sensitive     PIP/TAZO <=4 SENSITIVE Sensitive     * >=100,000 COLONIES/mL ESCHERICHIA COLI  Blood Culture ID Panel (Reflexed)     Status: Abnormal   Collection Time: 11/28/21  3:38 PM  Result Value Ref Range Status   Enterococcus faecalis NOT DETECTED NOT DETECTED Final   Enterococcus Faecium NOT DETECTED NOT DETECTED Final   Listeria monocytogenes NOT DETECTED NOT DETECTED Final   Staphylococcus species NOT DETECTED NOT DETECTED Final   Staphylococcus aureus (BCID) NOT DETECTED NOT DETECTED Final   Staphylococcus epidermidis NOT DETECTED NOT DETECTED Final   Staphylococcus lugdunensis NOT DETECTED NOT DETECTED Final   Streptococcus species NOT DETECTED NOT DETECTED Final   Streptococcus agalactiae NOT DETECTED NOT DETECTED Final   Streptococcus pneumoniae NOT DETECTED NOT DETECTED Final   Streptococcus pyogenes NOT DETECTED NOT DETECTED Final   A.calcoaceticus-baumannii NOT DETECTED NOT DETECTED Final   Bacteroides fragilis NOT DETECTED NOT DETECTED Final   Enterobacterales DETECTED (A) NOT DETECTED Final    Comment: Enterobacterales represent a large order of gram negative bacteria, not a single organism. CRITICAL RESULT CALLED TO, READ BACK BY AND VERIFIED WITH: RN J HAYES 1857 FCP    Enterobacter cloacae complex NOT DETECTED NOT DETECTED Final   Escherichia coli DETECTED (A) NOT DETECTED Final    Comment: CRITICAL RESULT CALLED TO, READ BACK BY AND VERIFIED WITH: RN J HAYES 1857 FCP    Klebsiella aerogenes NOT DETECTED NOT DETECTED Final   Klebsiella oxytoca NOT DETECTED NOT DETECTED Final   Klebsiella pneumoniae  NOT DETECTED NOT DETECTED Final   Proteus species NOT DETECTED NOT DETECTED Final   Salmonella species NOT DETECTED NOT DETECTED Final   Serratia marcescens NOT DETECTED NOT DETECTED Final   Haemophilus influenzae NOT DETECTED NOT DETECTED Final   Neisseria meningitidis NOT DETECTED NOT DETECTED Final   Pseudomonas aeruginosa NOT DETECTED NOT DETECTED Final   Stenotrophomonas maltophilia NOT DETECTED NOT DETECTED Final   Candida albicans NOT DETECTED NOT DETECTED Final   Candida auris NOT DETECTED NOT DETECTED Final   Candida glabrata NOT  DETECTED NOT DETECTED Final   Candida krusei NOT DETECTED NOT DETECTED Final   Candida parapsilosis NOT DETECTED NOT DETECTED Final   Candida tropicalis NOT DETECTED NOT DETECTED Final   Cryptococcus neoformans/gattii NOT DETECTED NOT DETECTED Final   CTX-M ESBL NOT DETECTED NOT DETECTED Final   Carbapenem resistance IMP NOT DETECTED NOT DETECTED Final   Carbapenem resistance KPC NOT DETECTED NOT DETECTED Final   Carbapenem resistance NDM NOT DETECTED NOT DETECTED Final   Carbapenem resist OXA 48 LIKE NOT DETECTED NOT DETECTED Final   Carbapenem resistance VIM NOT DETECTED NOT DETECTED Final    Comment: Performed at [facility], [address], [place], [place] [address]  Blood Culture (routine x 2)     Status: Abnormal   Collection Time: 11/28/21  3:43 PM   Specimen: Left Antecubital; Blood  Result Value Ref Range Status   Specimen Description   Final    LEFT ANTECUBITAL BLOOD Performed at [facility], [address] [place], [place] [address]    Special Requests   Final    BOTTLES DRAWN AEROBIC AND ANAEROBIC Blood Culture adequate volume Performed at [facility], [address], [place], [place] [address]    Culture  Setup Time   Final    GRAM NEGATIVE RODS IN BOTH AEROBIC AND ANAEROBIC BOTTLES Gram Stain Report Called to,Read Back By and Verified With: FOELY B @ 1303 ON [id number] BY HENDERSON L CRITICAL VALUE NOTED.  VALUE IS  CONSISTENT WITH PREVIOUSLY REPORTED AND CALLED VALUE.    Culture (A)  Final    ESCHERICHIA COLI SUSCEPTIBILITIES PERFORMED ON PREVIOUS CULTURE WITHIN THE LAST 5 DAYS. Performed at [facility], [address] [place], [place] [address]    Report Status 12/01/2021 FINAL  Final  MRSA Next Gen by PCR, Nasal     Status: Abnormal   Collection Time: 11/28/21  7:56 PM   Specimen: Nasal Mucosa; Nasal Swab  Result Value Ref Range Status   MRSA by PCR Next Gen DETECTED (A) NOT DETECTED Final    Comment: RESULT CALLED TO, READ BACK BY AND VERIFIED WITH: RONE,N ON 11/28/21 AT 2245 BY LOY,C (NOTE) The GeneXpert MRSA Assay (FDA approved for NASAL specimens only), is one component of a comprehensive MRSA colonization surveillance program. It is not intended to diagnose MRSA infection nor to guide or monitor treatment for MRSA infections. Test performance is not FDA approved in patients less than 55 years old. Performed at [facility], [address], [place], [place] [address]          Radiology Studies: No results found.      Scheduled Meds:  baclofen  20 mg Oral TID   gabapentin  800 mg Oral TID   heparin  5,000 Units Subcutaneous Q8H   insulin aspart  0-9 Units Subcutaneous Q4H   insulin glargine-yfgn  10 Units Subcutaneous Daily   metoprolol tartrate  25 mg Oral BID   mupirocin ointment  1 Application Nasal BID   Continuous Infusions:  ceFEPime (MAXIPIME) IV Stopped (12/01/21 8372)   famotidine (PEPCID) IV Stopped (11/30/21 1828)     LOS: 3 days    Time spent:    Erick Blinks, MD [facility]   If 7PM-7AM, please contact night-coverage [email or web address]  12/01/2021, 12:41 PM

## 2021-12-02 DIAGNOSIS — R7881 Bacteremia: Secondary | ICD-10-CM | POA: Diagnosis not present

## 2021-12-02 DIAGNOSIS — A419 Sepsis, unspecified organism: Secondary | ICD-10-CM | POA: Diagnosis not present

## 2021-12-02 DIAGNOSIS — E1159 Type 2 diabetes mellitus with other circulatory complications: Secondary | ICD-10-CM | POA: Diagnosis not present

## 2021-12-02 DIAGNOSIS — N179 Acute kidney failure, unspecified: Secondary | ICD-10-CM | POA: Diagnosis not present

## 2021-12-02 LAB — BASIC METABOLIC PANEL
Anion gap: 4 — ABNORMAL LOW (ref 5–15)
BUN: 11 mg/dL (ref 6–20)
CO2: 27 mmol/L (ref 22–32)
Calcium: 7.9 mg/dL — ABNORMAL LOW (ref 8.9–10.3)
Chloride: 108 mmol/L (ref 98–111)
Creatinine, Ser: 0.86 mg/dL (ref 0.44–1.00)
GFR, Estimated: >60 mL/min (ref >60)
Glucose, Bld: 94 mg/dL (ref 70–99)
Potassium: 4.8 mmol/L (ref 3.5–5.1)
Sodium: 139 mmol/L (ref 135–145)

## 2021-12-02 LAB — GLUCOSE, CAPILLARY
Glucose-Capillary: 102 mg/dL — ABNORMAL HIGH (ref 70–99)
Glucose-Capillary: 97 mg/dL (ref 70–99)
Glucose-Capillary: 120 mg/dL — ABNORMAL HIGH (ref 70–99)

## 2021-12-02 LAB — MAGNESIUM: Magnesium: 2.1 mg/dL (ref 1.7–2.4)

## 2021-12-02 MED ORDER — CEFADROXIL 500 MG PO CAPS
1000.0000 mg | ORAL_CAPSULE | Freq: Two times a day (BID) | ORAL | Status: DC
  Administered 2021-12-02: 1000 mg via ORAL
  Filled 2021-12-02 (×3): qty 2

## 2021-12-02 MED ORDER — NOVOLIN 70/30 FLEXPEN RELION (70-30) 100 UNIT/ML ~~LOC~~ SUPN: 10.0000 [IU] | PEN_INJECTOR | Freq: Two times a day (BID) | SUBCUTANEOUS | 2 refills | Status: AC

## 2021-12-02 MED ORDER — CEFADROXIL 500 MG PO CAPS: 1000.0000 mg | ORAL_CAPSULE | Freq: Two times a day (BID) | ORAL | 0 refills | Status: AC

## 2021-12-02 NOTE — Discharge Summary (Signed)
Physician Discharge Summary  [name] MAU:[id number] DOB: 23-Oct-1965 DOA: 11/28/2021  PCP: Redmond School, MD  Admit date: 11/28/2021 Discharge date: 12/02/2021  Admitted From: home Disposition:  home  Recommendations for Outpatient Follow-up:  Follow up with PCP in 1-2 weeks Please obtain BMP/CBC in one week Follow-up with PCP regarding diabetes management  Home Health: Equipment/Devices:  Discharge Condition: Stable CODE STATUS: Full code Diet recommendation: Heart healthy, carb modified  Brief/Interim Summary: 56 year old female with history of multiple sclerosis, hypertension, hyperlipidemia, diabetes, presented to emergency room with falls, confusion.  Noted to be febrile, tachycardic.  Noted to have sepsis secondary to gram-negative bacteremia.  Felt to be related to possible pyelonephritis.  Started on broad-spectrum antibiotics  Discharge Diagnoses:  Principal Problem:   Severe sepsis ([facility]) Active Problems:   Esophageal reflux   HTN (hypertension)   Mixed hyperlipidemia   Multiple sclerosis ([facility])   Uncontrolled type 2 diabetes mellitus with hyperglycemia ([facility])   Fatty liver   Hypokalemia   AKI (acute kidney injury) ([facility])   LFT elevation   Gram-negative bacteremia  Severe sepsis secondary to E. coli bacteremia, secondary to E. coli UTI -Patient admitted with tachypnea, tachycardia, elevated lactate, fever and altered mental status -Blood cultures positive for E. coli, urine culture also positive for E. coli -CT indicated possible pyelonephritis, no obstructing stone identified -She was treated with intravenous antibiotics and based on culture sensitivities, she will be transition to cefadroxil to complete her course   Acute kidney injury -Secondary to sepsis -Avoid nephrotoxic agents -Started on IV fluids with improvement of renal function   GERD -Continue Pepcid   Acute metabolic encephalopathy -Likely related to sepsis and fever -Appears to be back to  baseline   Diabetes, insulin-dependent, uncontrolled with hypoglycemia -Admitted with significant hypoglycemia requiring D10 infusion -Glipizide, metformin and insulin 70/30 held on admission -Overall hypoglycemia is better -Tolerating liquids -Since her p.o. intake has not quite recovered, will hold off on resuming glipizide on discharge -70/30 dose has been reduced on discharge since she has only required 10 units daily of basal insulin during her hospital stay   Nausea and vomiting -Suspect this is related to UTI -No bowel findings on CT -Treat supportively with antiemetics -Tolerating solid food at the time of discharge   History of multiple sclerosis -Continue on baclofen for spasms -Ammonia level is not elevated -Continue gabapentin for chronic pain   Hypokalemia -Replace   Hypertension -Continue metoprolol -Resume lisinopril on discharge  Discharge Instructions  Discharge Instructions     Diet - low sodium heart healthy   Complete by: As directed    Increase activity slowly   Complete by: As directed       Allergies as of 12/02/2021       Reactions   Peanut-containing Drug Products Anaphylaxis, Hives   Patient states she is allergic to all nuts   Shellfish Allergy Anaphylaxis   Gluten Meal    Latex Other (See Comments)   Reaction: blistering   Prednisone Hives, Other (See Comments)   Reaction: causes psychological issues    Nexium [esomeprazole Magnesium] Hives   Tape Rash   Paper tape please        Medication List     STOP taking these medications    glipiZIDE 5 MG 24 hr tablet Commonly known as: GLUCOTROL XL       TAKE these medications    Accu-Chek Guide Me w/Device Kit 1 Piece by Does not apply route as directed.   Accu-Chek Guide  test strip Generic drug: glucose blood Use to test glucose 4 times daily   albuterol 108 (90 Base) MCG/ACT inhaler Commonly known as: VENTOLIN HFA Inhale 1-2 puffs into the lungs every 6 (six) hours as  needed for wheezing or shortness of breath.   albuterol 0.63 MG/3ML nebulizer solution Commonly known as: ACCUNEB Take 1 ampule by nebulization every 6 (six) hours as needed for wheezing or shortness of breath.   B-D ULTRAFINE III SHORT PEN 31G X 8 MM Misc Generic drug: Insulin Pen Needle USE TO INJECT INSULIN 2 TIMES DAILY   baclofen 20 MG tablet Commonly known as: LIORESAL Take 20 mg by mouth 3 (three) times daily.   blood glucose meter kit and supplies Kit Dispense based on patient and insurance preference. Use up to four times daily as directed. (FOR ICD-9 250.00, 250.01).   cefadroxil 500 MG capsule Commonly known as: DURICEF Take 2 capsules (1,000 mg total) by mouth 2 (two) times daily for 7 days.   citalopram 40 MG tablet Commonly known as: CELEXA Take 40 mg by mouth every evening.   clopidogrel 75 MG tablet Commonly known as: PLAVIX Take 75 mg by mouth daily.   Dexcom G7 Receiver Devi Use to check glucose as directed   Dexcom G7 Sensor Misc Change sensor every 10 days   gabapentin 800 MG tablet Commonly known as: NEURONTIN Take 800 mg by mouth 4 (four) times daily.   lisinopril 5 MG tablet Commonly known as: ZESTRIL TAKE ONE TABLET BY MOUTH ONCE DAILY.   metFORMIN 500 MG tablet Commonly known as: GLUCOPHAGE Take 2 tablets (1,000 mg total) by mouth 2 (two) times daily.   metoprolol succinate 50 MG 24 hr tablet Commonly known as: TOPROL-XL Take 1 tablet (50 mg total) by mouth daily. Take with or immediately following a meal.   NovoLIN 70/30 Kwikpen (70-30) 100 UNIT/ML KwikPen Generic drug: insulin isophane & regular human KwikPen Inject 10 Units into the skin 2 (two) times daily before a meal. What changed: how much to take   ondansetron 4 MG tablet Commonly known as: ZOFRAN Take 4 mg by mouth 4 (four) times daily as needed for nausea or vomiting.   oxycodone 30 MG immediate release tablet Commonly known as: ROXICODONE Take 15 mg by mouth every 6  (six) hours as needed for pain.   pravastatin 40 MG tablet Commonly known as: PRAVACHOL Take 1 tablet (40 mg total) by mouth every evening.   rOPINIRole 0.5 MG tablet Commonly known as: REQUIP Take 0.5 mg by mouth at bedtime.   Semaglutide (2 MG/DOSE) 8 MG/3ML Sopn Inject 2 mg as directed once a week.        Allergies  Allergen Reactions   Peanut-Containing Drug Products Anaphylaxis and Hives    Patient states she is allergic to all nuts   Shellfish Allergy Anaphylaxis   Gluten Meal    Latex Other (See Comments)    Reaction: blistering   Prednisone Hives and Other (See Comments)    Reaction: causes psychological issues    Nexium [Esomeprazole Magnesium] Hives   Tape Rash    Paper tape please    Consultations:    Procedures/Studies: CT ABDOMEN PELVIS WO CONTRAST  Result Date: 11/28/2021 CLINICAL DATA:  Flank pain, kidney stone suspected RENAL SEPSIS. Fall, near syncope, flank pain, kidney stone suspected, renal sepsis. Pt unable to elaborate on pain or follow breathing instructions. EXAM: CT ABDOMEN AND PELVIS WITHOUT CONTRAST TECHNIQUE: Multidetector CT imaging of the abdomen and pelvis was performed  following the standard protocol without IV contrast. RADIATION DOSE REDUCTION: This exam was performed according to the departmental dose-optimization program which includes automated exposure control, adjustment of the mA and/or kV according to patient size and/or use of iterative reconstruction technique. COMPARISON:  CT abdomen pelvis 01/18/2019 FINDINGS: Lower chest: Bilateral lower lobe subsegmental atelectasis. No acute abnormality. Hepatobiliary: Nodular hepatic contour. No focal liver abnormality. Status post cholecystectomy. No biliary dilatation. Pancreas: Diffusely atrophic. No focal lesion. Otherwise normal pancreatic contour. No surrounding inflammatory changes. No main pancreatic ductal dilatation. Spleen: Normal in size without focal abnormality. Splenule is noted.  Adrenals/Urinary Tract: No adrenal nodule bilaterally. Nonspecific left perinephric fat stranding which is asymmetric compared to the right. No nephrolithiasis and no hydronephrosis. No definite contour-deforming renal mass. No ureterolithiasis or hydroureter. The urinary bladder is unremarkable. Stomach/Bowel: Stomach is within normal limits. No evidence of bowel wall thickening or dilatation. Scattered colonic diverticulosis most prominent along the sigmoid colon. Status post appendectomy. Vascular/Lymphatic: No abdominal aorta or iliac aneurysm. Mild atherosclerotic plaque of the aorta and its branches. No abdominal, pelvic, or inguinal lymphadenopathy. Reproductive: Status post hysterectomy. No adnexal masses. Other: Nonspecific trace simple free fluid within the pelvis. No intraperitoneal free gas. No organized fluid collection. Musculoskeletal: No abdominal wall hernia or abnormality. No suspicious lytic or blastic osseous lesions. No acute displaced fracture. IMPRESSION: 1. Nonspecific asymmetric left perinephric fat stranding. Markedly limited evaluation on this noncontrast study. Correlate with urinalysis for underlying infection. 2. Colonic diverticulosis with no acute diverticulitis. 3. Cirrhosis. No definite findings of portal hypertension. No focal liver lesions identified. Please note that liver protocol enhanced MR and CT are the most sensitive tests for the screening detection of hepatocellular carcinoma in the high risk setting of cirrhosis. 4.  Aortic Atherosclerosis (ICD10-I70.0). Electronically Signed   By: Iven Finn M.D.   On: 11/28/2021 19:18   DG Chest Port 1 View  Result Date: 11/28/2021 CLINICAL DATA:  Sepsis. EXAM: PORTABLE CHEST 1 VIEW COMPARISON:  Chest radiograph 10/31/2021 FINDINGS: Stable cardiac and mediastinal contours. Low lung volume exam. Basilar atelectasis. No pleural effusion or pneumothorax. IMPRESSION: No active disease. Electronically Signed   By: Lovey Newcomer M.D.    On: 11/28/2021 15:49   CT Head Wo Contrast  Result Date: 11/28/2021 CLINICAL DATA:  Near syncope, fall, EXAM: CT HEAD WITHOUT CONTRAST TECHNIQUE: Contiguous axial images were obtained from the base of the skull through the vertex without intravenous contrast. RADIATION DOSE REDUCTION: This exam was performed according to the departmental dose-optimization program which includes automated exposure control, adjustment of the mA and/or kV according to patient size and/or use of iterative reconstruction technique. COMPARISON:  04/06/2020 FINDINGS: Brain: No acute intracranial findings are seen. There are no signs of bleeding within the cranium. Ventricles are not dilated. There are no epidural or subdural fluid collections. There is no focal edema or mass effect. Vascular: Unremarkable Skull: Unremarkable. Sinuses/Orbits: Unremarkable. Other: None. IMPRESSION: No acute intracranial findings are seen in noncontrast CT brain. Electronically Signed   By: Elmer Picker M.D.   On: 11/28/2021 15:32      Subjective: She is feeling better.  Tolerating diet.  No vomiting.  Discharge Exam: Vitals:   12/01/21 1528 12/01/21 1712 12/01/21 2001 12/02/21 0354  BP: (!) 163/78 (!) 152/84 (!) 141/70 (!) 153/95  Pulse: 86 95 97 86  Resp:   18 16  Temp:   98.7 F (37.1 C) 98.2 F (36.8 C)  TempSrc:      SpO2:   96% 96%  Weight:      Height:        General: Pt is alert, awake, not in acute distress Cardiovascular: RRR, S1/S2 +, no rubs, no gallops Respiratory: CTA bilaterally, no wheezing, no rhonchi Abdominal: Soft, NT, ND, bowel sounds + Extremities: no edema, no cyanosis    The results of significant diagnostics from this hospitalization (including imaging, microbiology, ancillary and laboratory) are listed below for reference.     Microbiology: Recent Results (from the past 240 hour(s))  Blood Culture (routine x 2)     Status: Abnormal   Collection Time: 11/28/21  3:38 PM   Specimen: Right  Antecubital; Blood  Result Value Ref Range Status   Specimen Description   Final    RIGHT ANTECUBITAL BLOOD Performed at [facility], [address] [place], [place] [address]    Special Requests   Final    BOTTLES DRAWN AEROBIC AND ANAEROBIC Blood Culture adequate volume Performed at [facility], [address], [place], [place] [address]    Culture  Setup Time   Final    GRAM NEGATIVE RODS IN BOTH AEROBIC AND ANAEROBIC BOTTLES Gram Stain Report Called to,Read Back By and Verified With: FOLEY B @ [phone] [id number] BY HENDERSON  CRITICAL RESULT CALLED TO, READ BACK BY AND VERIFIED WITH: RN J HAYES 1857 FCP Performed at [facility], [place] [address], [place], [place] [address]    Culture ESCHERICHIA COLI (A)  Final   Report Status 12/01/2021 FINAL  Final   Organism ID, Bacteria ESCHERICHIA COLI  Final      Susceptibility   Escherichia coli - MIC*    AMPICILLIN <=2 SENSITIVE Sensitive     CEFAZOLIN <=4 SENSITIVE Sensitive     CEFEPIME <=0.12 SENSITIVE Sensitive     CEFTAZIDIME <=1 SENSITIVE Sensitive     CEFTRIAXONE <=0.25 SENSITIVE Sensitive     CIPROFLOXACIN <=0.25 SENSITIVE Sensitive     GENTAMICIN <=1 SENSITIVE Sensitive     IMIPENEM <=0.25 SENSITIVE Sensitive     TRIMETH/SULFA <=20 SENSITIVE Sensitive     AMPICILLIN/SULBACTAM <=2 SENSITIVE Sensitive     PIP/TAZO <=4 SENSITIVE Sensitive     * ESCHERICHIA COLI  Urine Culture     Status: Abnormal   Collection Time: 11/28/21  3:38 PM   Specimen: Urine, Catheterized  Result Value Ref Range Status   Specimen Description   Final    URINE, CATHETERIZED Performed at [facility], [address], [place], [place] [address]    Special Requests   Final    NONE Performed at [facility], [address], [place], [place] [address]    Culture >=100,000 COLONIES/mL ESCHERICHIA COLI (A)  Final   Report Status 12/01/2021 FINAL  Final   Organism ID, Bacteria ESCHERICHIA COLI (A)  Final      Susceptibility   Escherichia coli -  MIC*    AMPICILLIN <=2 SENSITIVE Sensitive     CEFAZOLIN <=4 SENSITIVE Sensitive     CEFEPIME <=0.12 SENSITIVE Sensitive     CEFTRIAXONE <=0.25 SENSITIVE Sensitive     CIPROFLOXACIN <=0.25 SENSITIVE Sensitive     GENTAMICIN <=1 SENSITIVE Sensitive     IMIPENEM <=0.25 SENSITIVE Sensitive     NITROFURANTOIN <=16 SENSITIVE Sensitive     TRIMETH/SULFA <=20 SENSITIVE Sensitive     AMPICILLIN/SULBACTAM <=2 SENSITIVE Sensitive     PIP/TAZO <=4 SENSITIVE Sensitive     * >=100,000 COLONIES/mL ESCHERICHIA COLI  Blood Culture ID Panel (Reflexed)     Status: Abnormal  Collection Time: 11/28/21  3:38 PM  Result Value Ref Range Status   Enterococcus faecalis NOT DETECTED NOT DETECTED Final   Enterococcus Faecium NOT DETECTED NOT DETECTED Final   Listeria monocytogenes NOT DETECTED NOT DETECTED Final   Staphylococcus species NOT DETECTED NOT DETECTED Final   Staphylococcus aureus (BCID) NOT DETECTED NOT DETECTED Final   Staphylococcus epidermidis NOT DETECTED NOT DETECTED Final   Staphylococcus lugdunensis NOT DETECTED NOT DETECTED Final   Streptococcus species NOT DETECTED NOT DETECTED Final   Streptococcus agalactiae NOT DETECTED NOT DETECTED Final   Streptococcus pneumoniae NOT DETECTED NOT DETECTED Final   Streptococcus pyogenes NOT DETECTED NOT DETECTED Final   A.calcoaceticus-baumannii NOT DETECTED NOT DETECTED Final   Bacteroides fragilis NOT DETECTED NOT DETECTED Final   Enterobacterales DETECTED (A) NOT DETECTED Final    Comment: Enterobacterales represent a large order of gram negative bacteria, not a single organism. CRITICAL RESULT CALLED TO, READ BACK BY AND VERIFIED WITH: RN J HAYES 1857 FCP    Enterobacter cloacae complex NOT DETECTED NOT DETECTED Final   Escherichia coli DETECTED (A) NOT DETECTED Final    Comment: CRITICAL RESULT CALLED TO, READ BACK BY AND VERIFIED WITH: RN J HAYES 1857 FCP    Klebsiella aerogenes NOT DETECTED NOT DETECTED Final   Klebsiella oxytoca NOT  DETECTED NOT DETECTED Final   Klebsiella pneumoniae NOT DETECTED NOT DETECTED Final   Proteus species NOT DETECTED NOT DETECTED Final   Salmonella species NOT DETECTED NOT DETECTED Final   Serratia marcescens NOT DETECTED NOT DETECTED Final   Haemophilus influenzae NOT DETECTED NOT DETECTED Final   Neisseria meningitidis NOT DETECTED NOT DETECTED Final   Pseudomonas aeruginosa NOT DETECTED NOT DETECTED Final   Stenotrophomonas maltophilia NOT DETECTED NOT DETECTED Final   Candida albicans NOT DETECTED NOT DETECTED Final   Candida auris NOT DETECTED NOT DETECTED Final   Candida glabrata NOT DETECTED NOT DETECTED Final   Candida krusei NOT DETECTED NOT DETECTED Final   Candida parapsilosis NOT DETECTED NOT DETECTED Final   Candida tropicalis NOT DETECTED NOT DETECTED Final   Cryptococcus neoformans/gattii NOT DETECTED NOT DETECTED Final   CTX-M ESBL NOT DETECTED NOT DETECTED Final   Carbapenem resistance IMP NOT DETECTED NOT DETECTED Final   Carbapenem resistance KPC NOT DETECTED NOT DETECTED Final   Carbapenem resistance NDM NOT DETECTED NOT DETECTED Final   Carbapenem resist OXA 48 LIKE NOT DETECTED NOT DETECTED Final   Carbapenem resistance VIM NOT DETECTED NOT DETECTED Final    Comment: Performed at [facility], [address] [place], [place] [address]  Blood Culture (routine x 2)     Status: Abnormal   Collection Time: 11/28/21  3:43 PM   Specimen: Left Antecubital; Blood  Result Value Ref Range Status   Specimen Description   Final    LEFT ANTECUBITAL BLOOD Performed at [facility], [place] [address], [place], [place] [address]    Special Requests   Final    BOTTLES DRAWN AEROBIC AND ANAEROBIC Blood Culture adequate volume Performed at [facility], [address] [place], [place] [address]    Culture  Setup Time   Final    GRAM NEGATIVE RODS IN BOTH AEROBIC AND ANAEROBIC BOTTLES Gram Stain Report Called to,Read Back By and Verified With: FOELY B @ 0350 ON [id number]  BY HENDERSON L CRITICAL VALUE NOTED.  VALUE IS CONSISTENT WITH PREVIOUSLY REPORTED AND CALLED VALUE.    Culture (A)  Final    ESCHERICHIA COLI SUSCEPTIBILITIES PERFORMED ON  PREVIOUS CULTURE WITHIN THE LAST 5 DAYS. Performed at [facility], [place] [address], [place], [place] [address]    Report Status 12/01/2021 FINAL  Final  MRSA Next Gen by PCR, Nasal     Status: Abnormal   Collection Time: 11/28/21  7:56 PM   Specimen: Nasal Mucosa; Nasal Swab  Result Value Ref Range Status   MRSA by PCR Next Gen DETECTED (A) NOT DETECTED Final    Comment: RESULT CALLED TO, READ BACK BY AND VERIFIED WITH: RONE,N ON 11/28/21 AT 2245 BY LOY,C (NOTE) The GeneXpert MRSA Assay (FDA approved for NASAL specimens only), is one component of a comprehensive MRSA colonization surveillance program. It is not intended to diagnose MRSA infection nor to guide or monitor treatment for MRSA infections. Test performance is not FDA approved in patients less than 15 years old. Performed at [facility], [address], [place], [place] [address]      Labs: BNP (last 3 results) No results for input(s): "BNP" in the last 8760 hours. Basic Metabolic Panel: Recent Labs  Lab 11/28/21 1431 11/29/21 0347 11/30/21 1024 12/01/21 0435 12/02/21 0327  NA 137 139 135 137 139  K 3.4* 3.8 3.1* 3.1* 4.8  CL 104 110 105 107 108  CO2 _0 GLUCOSE 219* 67* 139* 149* 94  BUN 33* 29* _1 CREATININE 2.05* 1.33* 0.97 0.87 0.86  CALCIUM 8.2* 7.6* 7.6* 7.7* 7.9*  MG  --   --  1.8  --  2.1   Liver Function Tests: Recent Labs  Lab 11/28/21 1431 11/29/21 0347  AST 39 28  ALT 23 18  ALKPHOS 129* 106  BILITOT 0.6 0.8  PROT 7.8 6.4*  ALBUMIN 3.0* 2.4*   No results for input(s): "LIPASE", "AMYLASE" in the last 168 hours. Recent Labs  Lab 11/29/21 0348  AMMONIA 16   CBC: Recent Labs  Lab 11/28/21 1431 11/29/21 0347 11/30/21 1024 12/01/21 0435  WBC 21.5* 10.9* 7.6 6.3  HGB 10.6* 8.8* 8.4* 8.6*   HCT 34.7* 29.4* 26.7* 27.6*  MCV 79.6* 81.2 77.2* 78.2*  PLT 230 150 166 173   Cardiac Enzymes: No results for input(s): "CKTOTAL", "CKMB", "CKMBINDEX", "TROPONINI" in the last 168 hours. BNP: Invalid input(s): "POCBNP" CBG: Recent Labs  Lab 12/01/21 1638 12/01/21 2003 12/01/21 2339 12/02/21 0356 12/02/21 0751  GLUCAP 122* 135* 131* 102* 97   D-Dimer No results for input(s): "DDIMER" in the last 72 hours. Hgb A1c No results for input(s): "HGBA1C" in the last 72 hours. Lipid Profile No results for input(s): "CHOL", "HDL", "LDLCALC", "TRIG", "CHOLHDL", "LDLDIRECT" in the last 72 hours. Thyroid function studies No results for input(s): "TSH", "T4TOTAL", "T3FREE", "THYROIDAB" in the last 72 hours.  Invalid input(s): "FREET3" Anemia work up No results for input(s): "VITAMINB12", "FOLATE", "FERRITIN", "TIBC", "IRON", "RETICCTPCT" in the last 72 hours. Urinalysis    Component Value Date/Time   COLORURINE YELLOW 11/28/2021 1514   APPEARANCEUR HAZY (A) 11/28/2021 1514   LABSPEC 1.012 11/28/2021 1514   PHURINE 5.0 11/28/2021 1514   GLUCOSEU NEGATIVE 11/28/2021 1514   HGBUR MODERATE (A) 11/28/2021 1514   BILIRUBINUR NEGATIVE 11/28/2021 1514   KETONESUR NEGATIVE 11/28/2021 1514   PROTEINUR 30 (A) 11/28/2021 1514   UROBILINOGEN 1.0 06/26/2014 1720   NITRITE NEGATIVE 11/28/2021 1514   LEUKOCYTESUR LARGE (A) 11/28/2021 1514   Sepsis Labs Recent Labs  Lab 11/28/21 1431 11/29/21 0347 11/30/21 1024 12/01/21 0435  WBC 21.5* 10.9* 7.6 6.3   Microbiology Recent  Results (from the past 240 hour(s))  Blood Culture (routine x 2)     Status: Abnormal   Collection Time: 11/28/21  3:38 PM   Specimen: Right Antecubital; Blood  Result Value Ref Range Status   Specimen Description   Final    RIGHT ANTECUBITAL BLOOD Performed at [facility], [address], [place], [place] [address]    Special Requests   Final    BOTTLES DRAWN AEROBIC AND ANAEROBIC Blood Culture adequate  volume Performed at [facility], [address] [place], [place] [address]    Culture  Setup Time   Final    GRAM NEGATIVE RODS IN BOTH AEROBIC AND ANAEROBIC BOTTLES Gram Stain Report Called to,Read Back By and Verified With: FOLEY B @ [id number] BY HENDERSON  CRITICAL RESULT CALLED TO, READ BACK BY AND VERIFIED WITH: RN J HAYES 1857 FCP Performed at [facility], [place] [address] [place], [place] [address]    Culture ESCHERICHIA COLI (A)  Final   Report Status 12/01/2021 FINAL  Final   Organism ID, Bacteria ESCHERICHIA COLI  Final      Susceptibility   Escherichia coli - MIC*    AMPICILLIN <=2 SENSITIVE Sensitive     CEFAZOLIN <=4 SENSITIVE Sensitive     CEFEPIME <=0.12 SENSITIVE Sensitive     CEFTAZIDIME <=1 SENSITIVE Sensitive     CEFTRIAXONE <=0.25 SENSITIVE Sensitive     CIPROFLOXACIN <=0.25 SENSITIVE Sensitive     GENTAMICIN <=1 SENSITIVE Sensitive     IMIPENEM <=0.25 SENSITIVE Sensitive     TRIMETH/SULFA <=20 SENSITIVE Sensitive     AMPICILLIN/SULBACTAM <=2 SENSITIVE Sensitive     PIP/TAZO <=4 SENSITIVE Sensitive     * ESCHERICHIA COLI  Urine Culture     Status: Abnormal   Collection Time: 11/28/21  3:38 PM   Specimen: Urine, Catheterized  Result Value Ref Range Status   Specimen Description   Final    URINE, CATHETERIZED Performed at [facility], [address], [place], [place] [address]    Special Requests   Final    NONE Performed at [facility], [address] [place],  [address]    Culture >=100,000 COLONIES/mL ESCHERICHIA COLI (A)  Final   Report Status 12/01/2021 FINAL  Final   Organism ID, Bacteria ESCHERICHIA COLI (A)  Final      Susceptibility   Escherichia coli - MIC*    AMPICILLIN <=2 SENSITIVE Sensitive     CEFAZOLIN <=4 SENSITIVE Sensitive     CEFEPIME <=0.12 SENSITIVE Sensitive     CEFTRIAXONE <=0.25 SENSITIVE Sensitive     CIPROFLOXACIN <=0.25 SENSITIVE Sensitive     GENTAMICIN <=1 SENSITIVE Sensitive     IMIPENEM <=0.25  SENSITIVE Sensitive     NITROFURANTOIN <=16 SENSITIVE Sensitive     TRIMETH/SULFA <=20 SENSITIVE Sensitive     AMPICILLIN/SULBACTAM <=2 SENSITIVE Sensitive     PIP/TAZO <=4 SENSITIVE Sensitive     * >=100,000 COLONIES/mL ESCHERICHIA COLI  Blood Culture ID Panel (Reflexed)     Status: Abnormal   Collection Time: 11/28/21  3:38 PM  Result Value Ref Range Status   Enterococcus faecalis NOT DETECTED NOT DETECTED Final   Enterococcus Faecium NOT DETECTED NOT DETECTED Final   Listeria monocytogenes NOT DETECTED NOT DETECTED Final   Staphylococcus species NOT DETECTED NOT DETECTED Final   Staphylococcus aureus (BCID) NOT DETECTED NOT DETECTED Final   Staphylococcus epidermidis NOT DETECTED NOT DETECTED Final   Staphylococcus lugdunensis NOT DETECTED NOT DETECTED Final  Streptococcus species NOT DETECTED NOT DETECTED Final   Streptococcus agalactiae NOT DETECTED NOT DETECTED Final   Streptococcus pneumoniae NOT DETECTED NOT DETECTED Final   Streptococcus pyogenes NOT DETECTED NOT DETECTED Final   A.calcoaceticus-baumannii NOT DETECTED NOT DETECTED Final   Bacteroides fragilis NOT DETECTED NOT DETECTED Final   Enterobacterales DETECTED (A) NOT DETECTED Final    Comment: Enterobacterales represent a large order of gram negative bacteria, not a single organism. CRITICAL RESULT CALLED TO, READ BACK BY AND VERIFIED WITH: RN J HAYES 1857 FCP    Enterobacter cloacae complex NOT DETECTED NOT DETECTED Final   Escherichia coli DETECTED (A) NOT DETECTED Final    Comment: CRITICAL RESULT CALLED TO, READ BACK BY AND VERIFIED WITH: RN J HAYES 1857 FCP    Klebsiella aerogenes NOT DETECTED NOT DETECTED Final   Klebsiella oxytoca NOT DETECTED NOT DETECTED Final   Klebsiella pneumoniae NOT DETECTED NOT DETECTED Final   Proteus species NOT DETECTED NOT DETECTED Final   Salmonella species NOT DETECTED NOT DETECTED Final   Serratia marcescens NOT DETECTED NOT DETECTED Final   Haemophilus influenzae NOT  DETECTED NOT DETECTED Final   Neisseria meningitidis NOT DETECTED NOT DETECTED Final   Pseudomonas aeruginosa NOT DETECTED NOT DETECTED Final   Stenotrophomonas maltophilia NOT DETECTED NOT DETECTED Final   Candida albicans NOT DETECTED NOT DETECTED Final   Candida auris NOT DETECTED NOT DETECTED Final   Candida glabrata NOT DETECTED NOT DETECTED Final   Candida krusei NOT DETECTED NOT DETECTED Final   Candida parapsilosis NOT DETECTED NOT DETECTED Final   Candida tropicalis NOT DETECTED NOT DETECTED Final   Cryptococcus neoformans/gattii NOT DETECTED NOT DETECTED Final   CTX-M ESBL NOT DETECTED NOT DETECTED Final   Carbapenem resistance IMP NOT DETECTED NOT DETECTED Final   Carbapenem resistance KPC NOT DETECTED NOT DETECTED Final   Carbapenem resistance NDM NOT DETECTED NOT DETECTED Final   Carbapenem resist OXA 48 LIKE NOT DETECTED NOT DETECTED Final   Carbapenem resistance VIM NOT DETECTED NOT DETECTED Final    Comment: Performed at [facility], [address], [place], [place] [address]  Blood Culture (routine x 2)     Status: Abnormal   Collection Time: 11/28/21  3:43 PM   Specimen: Left Antecubital; Blood  Result Value Ref Range Status   Specimen Description   Final    LEFT ANTECUBITAL BLOOD Performed at [facility], [place] [address], [place], [place] [address]    Special Requests   Final    BOTTLES DRAWN AEROBIC AND ANAEROBIC Blood Culture adequate volume Performed at [facility], [address] [place], [place] [address]    Culture  Setup Time   Final    GRAM NEGATIVE RODS IN BOTH AEROBIC AND ANAEROBIC BOTTLES Gram Stain Report Called to,Read Back By and Verified With: FOELY B @ 4734 ON [id number] BY HENDERSON L CRITICAL VALUE NOTED.  VALUE IS CONSISTENT WITH PREVIOUSLY REPORTED AND CALLED VALUE.    Culture (A)  Final    ESCHERICHIA COLI SUSCEPTIBILITIES PERFORMED ON PREVIOUS CULTURE WITHIN THE LAST 5 DAYS. Performed at [facility], [place] [address]  [place], [place] [address]    Report Status 12/01/2021 FINAL  Final  MRSA Next Gen by PCR, Nasal     Status: Abnormal   Collection Time: 11/28/21  7:56 PM   Specimen: Nasal Mucosa; Nasal Swab  Result Value Ref Range Status   MRSA by PCR Next Gen DETECTED (A) NOT DETECTED Final  Comment: RESULT CALLED TO, READ BACK BY AND VERIFIED WITH: RONE,N ON 11/28/21 AT 2245 BY LOY,C (NOTE) The GeneXpert MRSA Assay (FDA approved for NASAL specimens only), is one component of a comprehensive MRSA colonization surveillance program. It is not intended to diagnose MRSA infection nor to guide or monitor treatment for MRSA infections. Test performance is not FDA approved in patients less than 32 years old. Performed at [facility], [address] [place], [place] [address]      Time coordinating discharge: 27mns  SIGNED:   JKathie Dike MD  [facility] 12/02/2021, 10:03 AM   If 7PM-7AM, please contact night-coverage [email or web address]

## 2021-12-02 NOTE — Plan of Care (Signed)
  Problem: Fluid Volume: Goal: Hemodynamic stability will improve Outcome: Adequate for Discharge   Problem: Clinical Measurements: Goal: Diagnostic test results will improve Outcome: Adequate for Discharge Goal: Signs and symptoms of infection will decrease Outcome: Adequate for Discharge   Problem: Respiratory: Goal: Ability to maintain adequate ventilation will improve Outcome: Adequate for Discharge   Problem: Education: Goal: Ability to describe self-care measures that may prevent or decrease complications (Diabetes Survival Skills Education) will improve Outcome: Adequate for Discharge Goal: Individualized Educational Video(s) Outcome: Adequate for Discharge   Problem: Coping: Goal: Ability to adjust to condition or change in health will improve Outcome: Adequate for Discharge   Problem: Fluid Volume: Goal: Ability to maintain a balanced intake and output will improve Outcome: Adequate for Discharge   Problem: Health Behavior/Discharge Planning: Goal: Ability to identify and utilize available resources and services will improve Outcome: Adequate for Discharge Goal: Ability to manage health-related needs will improve Outcome: Adequate for Discharge   Problem: Metabolic: Goal: Ability to maintain appropriate glucose levels will improve Outcome: Adequate for Discharge   Problem: Nutritional: Goal: Maintenance of adequate nutrition will improve Outcome: Adequate for Discharge Goal: Progress toward achieving an optimal weight will improve Outcome: Adequate for Discharge   Problem: Skin Integrity: Goal: Risk for impaired skin integrity will decrease Outcome: Adequate for Discharge   Problem: Tissue Perfusion: Goal: Adequacy of tissue perfusion will improve Outcome: Adequate for Discharge   Problem: Education: Goal: Knowledge of General Education information will improve Description: Including pain rating scale, medication(s)/side effects and non-pharmacologic  comfort measures Outcome: Adequate for Discharge   Problem: Health Behavior/Discharge Planning: Goal: Ability to manage health-related needs will improve Outcome: Adequate for Discharge   Problem: Clinical Measurements: Goal: Ability to maintain clinical measurements within normal limits will improve Outcome: Adequate for Discharge Goal: Will remain free from infection Outcome: Adequate for Discharge Goal: Diagnostic test results will improve Outcome: Adequate for Discharge Goal: Respiratory complications will improve Outcome: Adequate for Discharge Goal: Cardiovascular complication will be avoided Outcome: Adequate for Discharge   Problem: Activity: Goal: Risk for activity intolerance will decrease Outcome: Adequate for Discharge   Problem: Nutrition: Goal: Adequate nutrition will be maintained Outcome: Adequate for Discharge   Problem: Coping: Goal: Level of anxiety will decrease Outcome: Adequate for Discharge   Problem: Elimination: Goal: Will not experience complications related to bowel motility Outcome: Adequate for Discharge Goal: Will not experience complications related to urinary retention Outcome: Adequate for Discharge   Problem: Pain Managment: Goal: General experience of comfort will improve Outcome: Adequate for Discharge   Problem: Safety: Goal: Ability to remain free from injury will improve Outcome: Adequate for Discharge   Problem: Skin Integrity: Goal: Risk for impaired skin integrity will decrease Outcome: Adequate for Discharge   

## 2021-12-02 NOTE — Progress Notes (Signed)
Pt aware of pending discharge and states understanding. Pt states will call her family for ride home and will update staff on their time of arrival.

## 2021-12-02 NOTE — Progress Notes (Signed)
Pt discharged via WC to POV with all personal belongings in her possession. 

## 2021-12-31 ENCOUNTER — Other Ambulatory Visit: Payer: Self-pay | Admitting: "Endocrinology

## 2022-01-05 ENCOUNTER — Other Ambulatory Visit: Payer: Self-pay | Admitting: "Endocrinology

## 2022-01-07 ENCOUNTER — Telehealth: Payer: Self-pay | Admitting: "Endocrinology

## 2022-01-07 NOTE — Telephone Encounter (Signed)
[facility] left a VM stating Ozempic 2mg  pen is on back order. She does not have a release date avail. She is asking for an alternative RX for this medication. Please Advise. She said she can not do an alternative according to what is on the RX. She needs a new script called in per Dr Dorris Fetch. Thank you

## 2022-01-08 ENCOUNTER — Other Ambulatory Visit: Payer: Self-pay | Admitting: "Endocrinology

## 2022-01-08 MED ORDER — SEMAGLUTIDE (1 MG/DOSE) 4 MG/3ML ~~LOC~~ SOPN: 1.0000 mg | PEN_INJECTOR | SUBCUTANEOUS | 1 refills | Status: DC

## 2022-01-08 NOTE — Telephone Encounter (Signed)
Noted  

## 2022-01-15 ENCOUNTER — Ambulatory Visit: Payer: BC Managed Care – PPO | Admitting: "Endocrinology

## 2022-01-16 LAB — COMPREHENSIVE METABOLIC PANEL
ALT: 18 IU/L (ref 0–32)
AST: 26 IU/L (ref 0–40)
Albumin/Globulin Ratio: 0.8 — ABNORMAL LOW (ref 1.2–2.2)
Albumin: 3.3 g/dL — ABNORMAL LOW (ref 3.8–4.9)
Alkaline Phosphatase: 109 IU/L (ref 44–121)
BUN/Creatinine Ratio: 16 (ref 9–23)
BUN: 12 mg/dL (ref 6–24)
Bilirubin Total: <0.2 mg/dL (ref 0.0–1.2)
CO2: 22 mmol/L (ref 20–29)
Calcium: 8.3 mg/dL — ABNORMAL LOW (ref 8.7–10.2)
Chloride: 106 mmol/L (ref 96–106)
Creatinine, Ser: 0.74 mg/dL (ref 0.57–1.00)
Globulin, Total: 4.1 g/dL (ref 1.5–4.5)
Glucose: 108 mg/dL — ABNORMAL HIGH (ref 70–99)
Potassium: 4.1 mmol/L (ref 3.5–5.2)
Sodium: 141 mmol/L (ref 134–144)
Total Protein: 7.4 g/dL (ref 6.0–8.5)
eGFR: 95 mL/min/{1.73_m2} (ref >59)

## 2022-01-16 LAB — T4, FREE: Free T4: 1.05 ng/dL (ref 0.82–1.77)

## 2022-01-16 LAB — T3, FREE: T3, Free: 3 pg/mL (ref 2.0–4.4)

## 2022-01-16 LAB — LIPID PANEL
Chol/HDL Ratio: 3.1 ratio (ref 0.0–4.4)
Cholesterol, Total: 121 mg/dL (ref 100–199)
HDL: 39 mg/dL — ABNORMAL LOW (ref >39)
LDL Chol Calc (NIH): 64 mg/dL (ref 0–99)
Triglycerides: 93 mg/dL (ref 0–149)
VLDL Cholesterol Cal: 18 mg/dL (ref 5–40)

## 2022-01-16 LAB — TSH: TSH: 0.306 u[IU]/mL — ABNORMAL LOW (ref 0.450–4.500)

## 2022-01-19 ENCOUNTER — Ambulatory Visit (INDEPENDENT_AMBULATORY_CARE_PROVIDER_SITE_OTHER): Payer: BC Managed Care – PPO | Admitting: "Endocrinology

## 2022-01-19 ENCOUNTER — Ambulatory Visit: Payer: BC Managed Care – PPO | Admitting: "Endocrinology

## 2022-01-19 ENCOUNTER — Encounter: Payer: Self-pay | Admitting: "Endocrinology

## 2022-01-19 VITALS — BP 120/78 | HR 88 | Ht 65.0 in | Wt 182.0 lb

## 2022-01-19 DIAGNOSIS — E1159 Type 2 diabetes mellitus with other circulatory complications: Secondary | ICD-10-CM | POA: Diagnosis not present

## 2022-01-19 DIAGNOSIS — I10 Essential (primary) hypertension: Secondary | ICD-10-CM | POA: Diagnosis not present

## 2022-01-19 DIAGNOSIS — E782 Mixed hyperlipidemia: Secondary | ICD-10-CM

## 2022-01-19 MED ORDER — DEXCOM G7 SENSOR MISC: 2 refills | Status: AC

## 2022-01-19 NOTE — Progress Notes (Signed)
01/19/2022, 6:58 PM    Endocrinology follow-up note    Subjective:    Patient ID: [name], female    DOB: 02-19-1966.  Nevena C Angelucci is being seen in follow-up for management of currently uncontrolled symptomatic 2 diabetes, hyperlipidemia, hypertension. PMD:   Redmond School, MD.   Past Medical History:  Diagnosis Date   Asthma    Chronic abdominal pain    Chronic neck pain    C4-6 fusion   CONSTIPATION 05/16/2009   Qualifier: Diagnosis of  By: Ronnald Ramp FNP-BC, Kandice L    Diabetes mellitus without complication ([place])    Dysphagia 02/23/2012   Fatty liver    GERD (gastroesophageal reflux disease)    Helicobacter pylori gastritis 2011   Hyperlipidemia    Hypertension    LIVER FUNCTION TESTS, ABNORMAL, HX OF 05/14/2009   Qualifier: Diagnosis of  By: Olena Heckle, [place]     Migraine headache    MIGRAINE, COMMON 05/14/2009   Qualifier: Diagnosis of  By: Stephan Minister     MS (multiple sclerosis) [facility])    Sleep apnea    Past Surgical History:  Procedure Laterality Date   ABDOMINAL HYSTERECTOMY     APPENDECTOMY     BACK SURGERY  03/07/2012   Nerve Stimulator   CHOLECYSTECTOMY     COLONOSCOPY  07/09/2009   RMR; normal rectum aside from anal canal hemorrhoids/scattered left-sided diverticula   ESOPHAGOGASTRODUODENOSCOPY  05/22/2009   RMR; normal /small HH   ESOPHAGOGASTRODUODENOSCOPY  2007   RMR: non-critical Schatzki's rin, non-maniuplated   ESOPHAGOGASTRODUODENOSCOPY N/A 10/24/2013   Procedure: ESOPHAGOGASTRODUODENOSCOPY (EGD);  Surgeon: Daneil Dolin, MD;  [facility]: [facility];  Service: Endoscopy;  Laterality: N/A;  9:45   ESOPHAGOGASTRODUODENOSCOPY (EGD) WITH ESOPHAGEAL DILATION  03/17/2012   YKD:XIPJASNK appearing esophageal mucosa of uncertain significance. Status post Venia Minks dilation followed by esophageal bx   MALONEY DILATION N/A  10/24/2013   Procedure: Venia Minks DILATION;  Surgeon: Daneil Dolin, MD;  Location: [facility];  Service: Endoscopy;  Laterality: N/A;   MANDIBLE SURGERY     NECK SURGERY     X2, last time 2013   Social History   Socioeconomic History   Marital status: Married    Spouse name: Not on file   Number of children: Not on file   Years of education: Not on file   Highest education level: Not on file  Occupational History   Not on file  Tobacco Use   Smoking status: Never   Smokeless tobacco: Never  Vaping Use   Vaping Use: Never used  Substance and Sexual Activity   Alcohol use: No   Drug use: No   Sexual activity: Yes    Birth control/protection: Surgical  Other Topics Concern   Not on file  Social History Narrative   5 kids, raising grandchild   Social Determinants of Health   Financial Resource Strain: Not  on file  Food Insecurity: Not on file  Transportation Needs: Not on file  Physical Activity: Not on file  Stress: Not on file  Social Connections: Not on file   Outpatient Encounter Medications as of 01/19/2022  Medication Sig   Accu-Chek Softclix Lancets lancets USE TO CHECK BLOOD SUGAR 4 TIMES DAILY AS DIRECTED   albuterol (ACCUNEB) 0.63 MG/3ML nebulizer solution Take 1 ampule by nebulization every 6 (six) hours as needed for wheezing or shortness of breath.   albuterol (VENTOLIN HFA) 108 (90 Base) MCG/ACT inhaler Inhale 1-2 puffs into the lungs every 6 (six) hours as needed for wheezing or shortness of breath.    baclofen (LIORESAL) 20 MG tablet Take 20 mg by mouth 3 (three) times daily.    blood glucose meter kit and supplies KIT Dispense based on patient and insurance preference. Use up to four times daily as directed. (FOR ICD-9 250.00, 250.01).   Blood Glucose Monitoring Suppl (ACCU-CHEK GUIDE ME) w/Device KIT USE TO CHECK BLOOD SUGAR DAILY AS DIRECTED   citalopram (CELEXA) 40 MG tablet Take 40 mg by mouth every evening.    clopidogrel (PLAVIX) 75 MG tablet Take  75 mg by mouth daily.   Continuous Blood Gluc Receiver (DEXCOM G7 RECEIVER) DEVI Use to check glucose as directed   Continuous Blood Gluc Sensor (DEXCOM G7 SENSOR) MISC Change sensor every 10 days   gabapentin (NEURONTIN) 800 MG tablet Take 800 mg by mouth 4 (four) times daily.   glucose blood (ACCU-CHEK GUIDE) test strip USE TO CHECK BLOOD SUGAR 4 TIMES DAILY AS DIRECTED   insulin isophane & regular human KwikPen (NOVOLIN 70/30 KWIKPEN) (70-30) 100 UNIT/ML KwikPen Inject 10 Units into the skin 2 (two) times daily before a meal.   Insulin Pen Needle (B-D ULTRAFINE III SHORT PEN) 31G X 8 MM MISC USE TO INJECT INSULIN 2 TIMES DAILY   lisinopril (ZESTRIL) 5 MG tablet TAKE ONE TABLET BY MOUTH ONCE DAILY.   metFORMIN (GLUCOPHAGE) 500 MG tablet Take 2 tablets (1,000 mg total) by mouth 2 (two) times daily.   metoprolol succinate (TOPROL-XL) 50 MG 24 hr tablet Take 1 tablet (50 mg total) by mouth daily. Take with or immediately following a meal.   ondansetron (ZOFRAN) 4 MG tablet Take 4 mg by mouth 4 (four) times daily as needed for nausea or vomiting.   oxycodone (ROXICODONE) 30 MG immediate release tablet Take 15 mg by mouth every 6 (six) hours as needed for pain.   pravastatin (PRAVACHOL) 40 MG tablet Take 1 tablet (40 mg total) by mouth every evening.   rOPINIRole (REQUIP) 0.5 MG tablet Take 0.5 mg by mouth at bedtime.   [DISCONTINUED] Continuous Blood Gluc Sensor (DEXCOM G7 SENSOR) MISC Change sensor every 10 days   [DISCONTINUED] Semaglutide, 1 MG/DOSE, 4 MG/3ML SOPN Inject 1 mg as directed once a week. (Patient taking differently: Inject 2 mg as directed once a week.)   No facility-administered encounter medications on file as of 01/19/2022.    ALLERGIES: Allergies  Allergen Reactions   Peanut-Containing Drug Products Anaphylaxis and Hives    Patient states she is allergic to all nuts   Shellfish Allergy Anaphylaxis   Gluten Meal    Latex Other (See Comments)    Reaction: blistering    Prednisone Hives and Other (See Comments)    Reaction: causes psychological issues    Nexium [Esomeprazole Magnesium] Hives   Tape Rash    Paper tape please    VACCINATION STATUS: Immunization History  Administered Date(s)  Administered   Influenza Split 04/17/2012   Pneumococcal Polysaccharide-23 04/17/2012    Diabetes She presents for her follow-up diabetic visit. She has type 2 diabetes mellitus. Onset time: She was diagnosed at approximate age of 71 years. Her disease course has been improving (in the interim , she was hospitalized for urosepsis.). There are no hypoglycemic associated symptoms. Pertinent negatives for hypoglycemia include no confusion, pallor or seizures. Associated symptoms include fatigue. Pertinent negatives for diabetes include no polydipsia, no polyphagia and no polyuria. There are no hypoglycemic complications. Symptoms are improving. There are no diabetic complications. Risk factors for coronary artery disease include diabetes mellitus, hypertension, obesity, sedentary lifestyle, dyslipidemia, post-menopausal and family history. Current diabetic treatment includes insulin injections and oral agent (dual therapy). She is compliant with treatment most of the time. Her weight is decreasing steadily (lost above 20 lbs since last visit). She is following a generally unhealthy diet. When asked about meal planning, she reported none. She has not had a previous visit with a dietitian. She never participates in exercise. Her home blood glucose trend is decreasing steadily. Her breakfast blood glucose range is generally 140-180 mg/dl. Her lunch blood glucose range is generally 140-180 mg/dl. Her dinner blood glucose range is generally 140-180 mg/dl. Her bedtime blood glucose range is generally 140-180 mg/dl. Her overall blood glucose range is 140-180 mg/dl. (Irene presents with better control of her glycemia, mainly due to  weigh loss related to her recent hospitalization for  urosepsis. Her CGM is analysed.   Her average blood glucose is  162 mg per DL.  She has 70% TIR, 26 % Level 1 hyperglycemia. No significant hypoglycemia. Her recent a1c was 7%. ) An ACE inhibitor/angiotensin II receptor blocker is being taken. She does not see a podiatrist.Eye exam is current.  Hyperlipidemia This is a chronic problem. The current episode started more than 1 year ago. The problem is uncontrolled. Exacerbating diseases include diabetes and obesity. Factors aggravating her hyperlipidemia include beta blockers. Pertinent negatives include no myalgias. Current antihyperlipidemic treatment includes statins. The current treatment provides mild improvement of lipids. There are no compliance problems.  Risk factors for coronary artery disease include dyslipidemia, diabetes mellitus, hypertension, obesity and a sedentary lifestyle.    Review of systems  Constitutional: + Minimally fluctuating body weight,  current Body mass index is 30.29 kg/m. , + fatigue-improving, no subjective hyperthermia, no subjective hypothermia    Objective:    BP 120/78   Pulse 88   Ht _0  (1.651 m)   Wt 182 lb (82.6 kg)   BMI 30.29 kg/m   Wt Readings from Last 3 Encounters:  01/19/22 182 lb (82.6 kg)  11/29/21 203 lb 4.2 oz (92.2 kg)  10/15/21 201 lb 9.6 oz (91.4 kg)    BP Readings from Last 3 Encounters:  01/19/22 120/78  12/02/21 (!) 148/77  10/15/21 112/70      Physical Exam- Limited  Constitutional:  Body mass index is 30.29 kg/m. , not in acute distress, normal state of mind    Foot exam:  No rashes, ulcers, cuts, calluses, onychodystrophy.  Good pulses bilat. Decreased sensation to 10 g monofilament bilat. Worse on left than right  Diabetic Labs (most recent): Lab Results  Component Value Date   HGBA1C 7.0 (H) 11/28/2021   HGBA1C 7.8 (A) 10/15/2021   HGBA1C 7.8 (A) 07/16/2021   MICROALBUR 150 06/26/2020   MICROALBUR 4.2 04/28/2017     Lipid Panel ( most  recent) Lipid Panel     Component  Value Date/Time   CHOL 121 01/15/2022 1330   TRIG 93 01/15/2022 1330   HDL 39 (L) 01/15/2022 1330   CHOLHDL 3.1 01/15/2022 1330   CHOLHDL 3.3 03/12/2019 0432   VLDL 21 03/12/2019 0432   LDLCALC 64 01/15/2022 1330   LDLCALC 116 (H) 04/28/2017 1101     Lab Results  Component Value Date   TSH 0.306 (L) 01/15/2022   TSH 0.502 07/15/2021   TSH 0.303 (L) 06/20/2020   TSH 0.237 (L) 03/20/2020   TSH 0.267 (L) 12/20/2019   TSH 0.31 (L) 04/28/2017   TSH 0.667 04/16/2012   FREET4 1.05 01/15/2022   FREET4 1.15 07/15/2021   FREET4 1.17 06/20/2020   FREET4 1.04 03/20/2020   FREET4 1.34 12/20/2019   FREET4 1.1 04/28/2017       Assessment & Plan:   1) 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  Emmalee presents with better control of her glycemia, mainly due to  weigh loss related to her recent hospitalization for urosepsis. Her CGM is analysed.   Her average blood glucose is  162 mg per DL.  She has 70% TIR, 26 % Level 1 hyperglycemia. No significant hypoglycemia. Her recent a1c was 7%.  -her diabetes is complicated by CVA, obesity/ sedentary life and Shakera C [place] remains at extremely high risk for more acute and chronic complications which include CAD, CVA, CKD, retinopathy, and neuropathy. These are all discussed in detail with the patient.  - Nutritional counseling repeated at each appointment due to patients tendency to fall back in to old habits.  - she acknowledges that there is a room for improvement in her food and drink choices. - Suggestion is made for her to avoid simple carbohydrates  from her diet including Cakes, Sweet Desserts, Ice Cream, Soda (diet and regular), Sweet Tea, Candies, Chips, Cookies, Store Bought Juices, Alcohol , Artificial Sweeteners,  Coffee Creamer, and "Sugar-free" Products, Lemonade. This will help patient to have more stable blood glucose profile and potentially avoid unintended weight  gain.  The following Lifestyle Medicine recommendations according to [facility]) were discussed and and offered to patient and she  agrees to start the journey:  A. Whole Foods, Plant-Based Nutrition comprising of fruits and vegetables, plant-based proteins, whole-grain carbohydrates was discussed in detail with the patient.   A list for source of those nutrients were also provided to the patient.  Patient will use only water or unsweetened tea for hydration. B.  The need to stay away from risky substances including alcohol, smoking; obtaining 7 to 9 hours of restorative sleep, at least 150 minutes of moderate intensity exercise weekly, the importance of healthy social connections,  and stress management techniques were discussed. C.  A full color page of  Calorie density of various food groups per pound showing examples of each food groups was provided to the patient.    - Patient is advised to stick to a routine mealtimes to eat 3 meals a day and avoid unnecessary snacks (to snack only to correct hypoglycemia).  - I have approached her with the following individualized plan to manage diabetes and patient agrees:     She is at extremely  high risk for hypoglycemia from inadvertent use of insulin, especially if she decides to decrease her carbs consumption.  -Based on her presentation with controlled glycemia , she will be considered for de-escalation of medications. She is advised to continue her premixed insulin Novolin 70/30 60 units  with breakfast and 60 units with supper  if glucose is above 90 and she is eating.   She is advised to discontinue Ozempic and glipizide for now. She is advised to continue metformin 1000 mg p.o. twice daily.   -She is advised to continue to use her CGM continuously.  And she is encouraged to call the clinic if she has readings less than 70 or above 200 for 3 tests in a row.    2) Lipids/HPL:  Her most recent lipid panel  from  06/20/20 showed improved LDL profile at 64, improving from 116.  She is advised to continue atorvastatin 20 mg p.o. nightly.  She is counseled on side effects and precautions.     3) Hypertension:  -Her blood pressure is controlled to target.  She is advised to continue Lisinopril 5 mg p.o. daily with breakfast and Metoprolol 50 mg po daily.  4) Weight management:  Her Body mass index is 30.29 kg/m.-candidate for modest weight loss.  Carbohydrate restrictions and exercise regimen discussed in detail with her.  The above lifestyle nutrition will help with weight management.  - I advised patient to maintain close follow up with Redmond School, MD for primary care needs.   I spent 42 minutes in the care of the patient today including review of labs from [place], Lipids, Thyroid Function, Hematology (current and previous including abstractions from other facilities); face-to-face time discussing  her blood glucose readings/logs, discussing hypoglycemia and hyperglycemia episodes and symptoms, medications doses, her options of short and long term treatment based on the latest standards of care / guidelines;  discussion about incorporating lifestyle medicine;  and documenting the encounter. Risk reduction counseling performed per USPSTF guidelines to reduce obesity and cardiovascular risk factors.     Please refer to Patient Instructions for Blood Glucose Monitoring and Insulin/Medications Dosing Guide"  in media tab for additional information. Please  also refer to " Patient Self Inventory" in the Media  tab for reviewed elements of pertinent patient history.  Porshe Kirt Boys participated in the discussions, expressed understanding, and voiced agreement with the above plans.  All questions were answered to her satisfaction. she is encouraged to contact clinic should she have any questions or concerns prior to her return visit.    Follow up plan: - Return in about 9 weeks (around 03/23/2022) for Bring  Meter/CGM Device/Logs- A1c in Office.  Rayetta Pigg, [facility] [address] [place] [address] Phone: [phone] Fax: [phone]  01/19/2022, 6:58 PM

## 2022-01-19 NOTE — Patient Instructions (Signed)
                                     Advice for Weight Management  -For most of us the best way to lose weight is by diet management. Generally speaking, diet management means consuming less calories intentionally which over time brings about progressive weight loss.  This can be achieved more effectively by avoiding ultra processed carbohydrates, processed meats, unhealthy fats.    It is critically important to know your numbers: how much calorie you are consuming and how much calorie you need. More importantly, our carbohydrates sources should be unprocessed naturally occurring  complex starch food items.  It is always important to balance nutrition also by  appropriate intake of proteins (mainly plant-based), healthy fats/oils, plenty of fruits and vegetables.   -The American College of Lifestyle Medicine (ACL M) recommends nutrition derived mostly from Whole Food, Plant Predominant Sources example an apple instead of applesauce or apple pie. Eat Plenty of vegetables, Mushrooms, fruits, Legumes, Whole Grains, Nuts, seeds in lieu of processed meats, processed snacks/pastries red meat, poultry, eggs.  Use only water or unsweetened tea for hydration.  The College also recommends the need to stay away from risky substances including alcohol, smoking; obtaining 7-9 hours of restorative sleep, at least 150 minutes of moderate intensity exercise weekly, importance of healthy social connections, and being mindful of stress and seek help when it is overwhelming.    -Sticking to a routine mealtime to eat 3 meals a day and avoiding unnecessary snacks is shown to have a big role in weight control. Under normal circumstances, the only time we burn stored energy is when we are hungry, so allow  some hunger to take place- hunger means no food between appropriate meal times, only water.  It is not advisable to starve.   -It is better to avoid simple carbohydrates including:  Cakes, Sweet Desserts, Ice Cream, Soda (diet and regular), Sweet Tea, Candies, Chips, Cookies, Store Bought Juices, Alcohol in Excess of  1-2 drinks a day, Lemonade,  Artificial Sweeteners, Doughnuts, Coffee Creamers, "Sugar-free" Products, etc, etc.  This is not a complete list.....    -Consulting with certified diabetes educators is proven to provide you with the most accurate and current information on diet.  Also, you may be  interested in discussing diet options/exchanges , we can schedule a visit with [name], RDN, CDE for individualized nutrition education.  -Exercise: If you are able: 30 -60 minutes a day ,4 days a week, or 150 minutes of moderate intensity exercise weekly.    The longer the better if tolerated.  Combine stretch, strength, and aerobic activities.  If you were told in the past that you have high risk for cardiovascular diseases, or if you are currently symptomatic, you may seek evaluation by your heart doctor prior to initiating moderate to intense exercise programs.                                  Additional Care Considerations for Diabetes/Prediabetes   -Diabetes  is a chronic disease.  The most important care consideration is regular follow-up with your diabetes care provider with the goal being avoiding or delaying its complications and to take advantage of advances in medications and technology.  If appropriate actions are taken early enough, type 2 diabetes can even be   reversed.  Seek information from the right source.  - Whole Food, Plant Predominant Nutrition is highly recommended: Eat Plenty of vegetables, Mushrooms, fruits, Legumes, Whole Grains, Nuts, seeds in lieu of processed meats, processed snacks/pastries red meat, poultry, eggs as recommended by American College of  Lifestyle Medicine (ACLM).  -Type 2 diabetes is known to coexist with other important comorbidities such as high blood pressure and high cholesterol.  It is critical to control not only the  diabetes but also the high blood pressure and high cholesterol to minimize and delay the risk of complications including coronary artery disease, stroke, amputations, blindness, etc.  The good news is that this diet recommendation for type 2 diabetes is also very helpful for managing high cholesterol and high blood blood pressure.  - Studies showed that people with diabetes will benefit from a class of medications known as ACE inhibitors and statins.  Unless there are specific reasons not to be on these medications, the standard of care is to consider getting one from these groups of medications at an optimal doses.  These medications are generally considered safe and proven to help protect the heart and the kidneys.    - People with diabetes are encouraged to initiate and maintain regular follow-up with eye doctors, foot doctors, dentists , and if necessary heart and kidney doctors.     - It is highly recommended that people with diabetes quit smoking or stay away from smoking, and get yearly  flu vaccine and pneumonia vaccine at least every 5 years.  See above for additional recommendations on exercise, sleep, stress management , and healthy social connections.      

## 2022-01-29 ENCOUNTER — Other Ambulatory Visit: Payer: Self-pay | Admitting: "Endocrinology

## 2022-02-26 ENCOUNTER — Other Ambulatory Visit: Payer: Self-pay | Admitting: "Endocrinology

## 2022-03-05 ENCOUNTER — Other Ambulatory Visit: Payer: Self-pay | Admitting: "Endocrinology

## 2022-03-24 ENCOUNTER — Ambulatory Visit: Payer: BC Managed Care – PPO | Admitting: "Endocrinology

## 2022-03-25 ENCOUNTER — Encounter: Payer: Self-pay | Admitting: "Endocrinology

## 2022-03-25 ENCOUNTER — Ambulatory Visit (INDEPENDENT_AMBULATORY_CARE_PROVIDER_SITE_OTHER): Payer: BC Managed Care – PPO | Admitting: "Endocrinology

## 2022-03-25 VITALS — BP 108/72 | HR 108 | Ht 65.5 in | Wt 187.8 lb

## 2022-03-25 DIAGNOSIS — Z6835 Body mass index (BMI) 35.0-35.9, adult: Secondary | ICD-10-CM

## 2022-03-25 DIAGNOSIS — E1159 Type 2 diabetes mellitus with other circulatory complications: Secondary | ICD-10-CM | POA: Diagnosis not present

## 2022-03-25 DIAGNOSIS — I10 Essential (primary) hypertension: Secondary | ICD-10-CM | POA: Diagnosis not present

## 2022-03-25 DIAGNOSIS — E782 Mixed hyperlipidemia: Secondary | ICD-10-CM | POA: Diagnosis not present

## 2022-03-25 LAB — POCT GLYCOSYLATED HEMOGLOBIN (HGB A1C): HbA1c, POC (controlled diabetic range): 7 % (ref 0.0–7.0)

## 2022-03-25 NOTE — Patient Instructions (Signed)

## 2022-03-25 NOTE — Progress Notes (Signed)
03/25/2022, 12:42 PM    Endocrinology follow-up note    Subjective:    Patient ID: [name], female    DOB: 12-26-1965.  [name] is being seen in follow-up for management of currently uncontrolled symptomatic 2 diabetes, hyperlipidemia, hypertension. PMD:   [employer], MD.   Past Medical History:  Diagnosis Date   Asthma    Chronic abdominal pain    Chronic neck pain    C4-6 fusion   CONSTIPATION 05/16/2009   Qualifier: Diagnosis of  By: Ronnald Ramp FNP-BC, Kandice L    Diabetes mellitus without complication ([place])    Dysphagia 02/23/2012   Fatty liver    GERD (gastroesophageal reflux disease)    Helicobacter pylori gastritis 2011   Hyperlipidemia    Hypertension    LIVER FUNCTION TESTS, ABNORMAL, HX OF 05/14/2009   Qualifier: Diagnosis of  By: Olena Heckle, [place]     Migraine headache    MIGRAINE, COMMON 05/14/2009   Qualifier: Diagnosis of  By: Stephan Minister     MS (multiple sclerosis) [facility])    Sleep apnea    Past Surgical History:  Procedure Laterality Date   ABDOMINAL HYSTERECTOMY     APPENDECTOMY     BACK SURGERY  03/07/2012   Nerve Stimulator   CHOLECYSTECTOMY     COLONOSCOPY  07/09/2009   RMR; normal rectum aside from anal canal hemorrhoids/scattered left-sided diverticula   ESOPHAGOGASTRODUODENOSCOPY  05/22/2009   RMR; normal /small HH   ESOPHAGOGASTRODUODENOSCOPY  2007   RMR: non-critical Schatzki's rin, non-maniuplated   ESOPHAGOGASTRODUODENOSCOPY N/A 10/24/2013   Procedure: ESOPHAGOGASTRODUODENOSCOPY (EGD);  Surgeon: Daneil Dolin, MD;  Location: [facility];  Service: Endoscopy;  Laterality: N/A;  9:45   ESOPHAGOGASTRODUODENOSCOPY (EGD) WITH ESOPHAGEAL DILATION  03/17/2012   FEX:[id number] appearing esophageal mucosa of uncertain significance. Status post Venia Minks dilation followed by esophageal bx   MALONEY DILATION N/A  10/24/2013   Procedure: Venia Minks DILATION;  Surgeon: Daneil Dolin, MD;  Location: [facility];  Service: Endoscopy;  Laterality: N/A;   MANDIBLE SURGERY     NECK SURGERY     X2, last time 2013   Social History   Socioeconomic History   Marital status: Married    Spouse name: Not on file   Number of children: Not on file   Years of education: Not on file   Highest education level: Not on file  Occupational History   Not on file  Tobacco Use   Smoking status: Never   Smokeless tobacco: Never  Vaping Use   Vaping Use: Never used  Substance and Sexual Activity   Alcohol use: No   Drug use: No   Sexual activity: Yes    Birth control/protection: Surgical  Other Topics Concern   Not on file  Social History Narrative   5 kids, raising grandchild   Social Determinants of Health   Financial Resource Strain: Not  on file  Food Insecurity: Not on file  Transportation Needs: Not on file  Physical Activity: Not on file  Stress: Not on file  Social Connections: Not on file   Outpatient Encounter Medications as of 03/25/2022  Medication Sig   ACCU-CHEK GUIDE test strip USE TO CHECK BLOOD SUGAR 4 TIMES DAILY AS DIRECTED   Accu-Chek Softclix Lancets lancets USE TO CHECK BLOOD SUGAR 4 TIMES DAILY AS DIRECTED   albuterol (ACCUNEB) 0.63 MG/3ML nebulizer solution Take 1 ampule by nebulization every 6 (six) hours as needed for wheezing or shortness of breath.   albuterol (VENTOLIN HFA) 108 (90 Base) MCG/ACT inhaler Inhale 1-2 puffs into the lungs every 6 (six) hours as needed for wheezing or shortness of breath.    baclofen (LIORESAL) 20 MG tablet Take 20 mg by mouth 3 (three) times daily.    blood glucose meter kit and supplies KIT Dispense based on patient and insurance preference. Use up to four times daily as directed. (FOR ICD-9 250.00, 250.01).   Blood Glucose Monitoring Suppl (ACCU-CHEK GUIDE ME) w/Device KIT USE TO CHECK BLOOD SUGAR DAILY AS DIRECTED   citalopram (CELEXA) 40 MG  tablet Take 40 mg by mouth every evening.    clopidogrel (PLAVIX) 75 MG tablet Take 75 mg by mouth daily.   Continuous Blood Gluc Receiver (DEXCOM G7 RECEIVER) DEVI Use to check glucose as directed   Continuous Blood Gluc Sensor (DEXCOM G7 SENSOR) MISC Change sensor every 10 days   gabapentin (NEURONTIN) 800 MG tablet Take 800 mg by mouth 4 (four) times daily.   insulin isophane & regular human KwikPen (NOVOLIN 70/30 KWIKPEN) (70-30) 100 UNIT/ML KwikPen Inject 10 Units into the skin 2 (two) times daily before a meal.   Insulin Pen Needle (B-D ULTRAFINE III SHORT PEN) 31G X 8 MM MISC USE TO INJECT INSULIN 2 TIMES DAILY   lisinopril (ZESTRIL) 5 MG tablet TAKE ONE TABLET BY MOUTH ONCE DAILY.   metFORMIN (GLUCOPHAGE) 500 MG tablet Take 2 tablets (1,000 mg total) by mouth 2 (two) times daily.   metoprolol succinate (TOPROL-XL) 50 MG 24 hr tablet Take 1 tablet (50 mg total) by mouth daily. Take with or immediately following a meal.   ondansetron (ZOFRAN) 4 MG tablet Take 4 mg by mouth 4 (four) times daily as needed for nausea or vomiting.   oxycodone (ROXICODONE) 30 MG immediate release tablet Take 15 mg by mouth every 6 (six) hours as needed for pain.   pravastatin (PRAVACHOL) 40 MG tablet Take 1 tablet (40 mg total) by mouth every evening.   rOPINIRole (REQUIP) 0.5 MG tablet Take 0.5 mg by mouth at bedtime.   No facility-administered encounter medications on file as of 03/25/2022.    ALLERGIES: Allergies  Allergen Reactions   Peanut-Containing Drug Products Anaphylaxis and Hives    Patient states she is allergic to all nuts   Shellfish Allergy Anaphylaxis   Gluten Meal    Latex Other (See Comments)    Reaction: blistering   Prednisone Hives and Other (See Comments)    Reaction: causes psychological issues    Nexium [Esomeprazole Magnesium] Hives   Tape Rash    Paper tape please    VACCINATION STATUS: Immunization History  Administered Date(s) Administered   Influenza Split 04/17/2012    Pneumococcal Polysaccharide-23 04/17/2012    Diabetes She presents for her follow-up diabetic visit. She has type 2 diabetes mellitus. Onset time: She was diagnosed at approximate age of 61 years. Her disease course has been improving (in  the interim , she was hospitalized for urosepsis.). There are no hypoglycemic associated symptoms. Pertinent negatives for hypoglycemia include no confusion, pallor or seizures. Associated symptoms include fatigue. Pertinent negatives for diabetes include no polydipsia, no polyphagia and no polyuria. There are no hypoglycemic complications. Symptoms are improving. There are no diabetic complications. Risk factors for coronary artery disease include diabetes mellitus, hypertension, obesity, sedentary lifestyle, dyslipidemia, post-menopausal and family history. Current diabetic treatment includes insulin injections and oral agent (dual therapy). She is compliant with treatment most of the time. Her weight is fluctuating minimally (lost above 20 lbs since last visit). She is following a generally unhealthy diet. When asked about meal planning, she reported none. She has not had a previous visit with a dietitian. She never participates in exercise. Her home blood glucose trend is decreasing steadily. Her breakfast blood glucose range is generally 140-180 mg/dl. Her lunch blood glucose range is generally 140-180 mg/dl. Her dinner blood glucose range is generally 140-180 mg/dl. Her bedtime blood glucose range is generally 140-180 mg/dl. Her overall blood glucose range is 140-180 mg/dl. ([name] presents with better control of her glycemic profile.  Her CGM is analyzed showing 165 mg per DL as an average for the last 14 days.  She has 67% time in range, 27% level 1 hyperglycemia, 6% level 2 hyperglycemia.  She did not document any significant hypoglycemia.  Her point-of-care A1c is the same as last visit at 7%.    ) An ACE inhibitor/angiotensin II receptor blocker is being taken. She  does not see a podiatrist.Eye exam is current.  Hyperlipidemia This is a chronic problem. The current episode started more than 1 year ago. The problem is uncontrolled. Exacerbating diseases include diabetes and obesity. Factors aggravating her hyperlipidemia include beta blockers. Pertinent negatives include no myalgias. Current antihyperlipidemic treatment includes statins. The current treatment provides mild improvement of lipids. There are no compliance problems.  Risk factors for coronary artery disease include dyslipidemia, diabetes mellitus, hypertension, obesity and a sedentary lifestyle.    Review of systems  Constitutional: + Minimally fluctuating body weight,  current Body mass index is 30.78 kg/m. , + fatigue-improving, no subjective hyperthermia, no subjective hypothermia    Objective:    BP 108/72   Pulse (!) 108   Ht 5' 5.5" (1.664 m)   Wt 187 lb 12.8 oz (85.2 kg)   BMI 30.78 kg/m   Wt Readings from Last 3 Encounters:  03/25/22 187 lb 12.8 oz (85.2 kg)  01/19/22 182 lb (82.6 kg)  11/29/21 203 lb 4.2 oz (92.2 kg)    BP Readings from Last 3 Encounters:  03/25/22 108/72  01/19/22 120/78  12/02/21 (!) 148/77      Physical Exam- Limited  Constitutional:  Body mass index is 30.78 kg/m. , not in acute distress, normal state of mind    Foot exam:  No rashes, ulcers, cuts, calluses, onychodystrophy.  Good pulses bilat. Decreased sensation to 10 g monofilament bilat. Worse on left than right  Diabetic Labs (most recent): Lab Results  Component Value Date   HGBA1C 7.0 03/25/2022   HGBA1C 7.0 (H) 11/28/2021   HGBA1C 7.8 (A) 10/15/2021   MICROALBUR 150 06/26/2020   MICROALBUR 4.2 04/28/2017     Lipid Panel ( most recent) Lipid Panel     Component Value Date/Time   CHOL 121 01/15/2022 1330   TRIG 93 01/15/2022 1330   HDL 39 (L) 01/15/2022 1330   CHOLHDL 3.1 01/15/2022 1330   CHOLHDL 3.3 03/12/2019 0432  VLDL 21 03/12/2019 0432   LDLCALC 64 01/15/2022  1330   LDLCALC 116 (H) 04/28/2017 1101     Lab Results  Component Value Date   TSH 0.306 (L) 01/15/2022   TSH 0.502 07/15/2021   TSH 0.303 (L) 06/20/2020   TSH 0.237 (L) 03/20/2020   TSH 0.267 (L) 12/20/2019   TSH 0.31 (L) 04/28/2017   TSH 0.667 04/16/2012   FREET4 1.05 01/15/2022   FREET4 1.15 07/15/2021   FREET4 1.17 06/20/2020   FREET4 1.04 03/20/2020   FREET4 1.34 12/20/2019   FREET4 1.1 04/28/2017       Assessment & Plan:   1) 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  [name] presents with better control of her glycemic profile.  Her CGM is analyzed showing 165 mg per DL as an average for the last 14 days.  She has 67% time in range, 27% level 1 hyperglycemia, 6% level 2 hyperglycemia.  She did not document any significant hypoglycemia.  Her point-of-care A1c is the same as last visit at 7%.    -her diabetes is complicated by CVA, obesity/ sedentary life and [name] remains at extremely high risk for more acute and chronic complications which include CAD, CVA, CKD, retinopathy, and neuropathy. These are all discussed in detail with the patient.  - Nutritional counseling repeated at each appointment due to patients tendency to fall back in to old habits.  - she acknowledges that there is a room for improvement in her food and drink choices. - Suggestion is made for her to avoid simple carbohydrates  from her diet including Cakes, Sweet Desserts, Ice Cream, Soda (diet and regular), Sweet Tea, Candies, Chips, Cookies, Store Bought Juices, Alcohol , Artificial Sweeteners,  Coffee Creamer, and "Sugar-free" Products, Lemonade. This will help patient to have more stable blood glucose profile and potentially avoid unintended weight gain.  The following Lifestyle Medicine recommendations according to [place]  [facility]) were discussed and and offered to patient and she  agrees to start the journey:  A. Whole Foods,  Plant-Based Nutrition comprising of fruits and vegetables, plant-based proteins, whole-grain carbohydrates was discussed in detail with the patient.   A list for source of those nutrients were also provided to the patient.  Patient will use only water or unsweetened tea for hydration. B.  The need to stay away from risky substances including alcohol, smoking; obtaining 7 to 9 hours of restorative sleep, at least 150 minutes of moderate intensity exercise weekly, the importance of healthy social connections,  and stress management techniques were discussed. C.  A full color page of  Calorie density of various food groups per pound showing examples of each food groups was provided to the patient.      - Patient is advised to stick to a routine mealtimes to eat 3 meals a day and avoid unnecessary snacks (to snack only to correct hypoglycemia).  - I have approached her with the following individualized plan to manage diabetes and patient agrees:     She is at extremely  high risk for hypoglycemia from inadvertent use of insulin, especially if she decides to decrease her carbs consumption.  -Based on her presentation with controlled glycemia , she will not need basal/bolus insulin for now.   -She will be continued on her premixed insulin, Novolin 70/30 60 units with breakfast and 60 units with supper  if glucose is above 90 and she is eating.    She  is advised to continue metformin 1000 mg p.o. twice daily.   -She is advised to continue to use her CGM continuously.  And she is encouraged to call the clinic if she has readings less than 70 or above 200 for 3 tests in a row.   She will be reconsidered for Ozempic after her next visit.  2) Lipids/HPL:  Her most recent lipid panel  from 06/20/20 showed improved LDL profile at 64, improving from 116.  She is advised to continue atorvastatin 20 mg p.o. nightly.   She is counseled on side effects and precautions.     3) Hypertension:  -Her blood  pressure is controlled to target.   She is advised to continue Lisinopril 5 mg p.o. daily with breakfast and Metoprolol 50 mg po daily.  4) Weight management:  Her Body mass index is 30.78 kg/m.-candidate for modest weight loss.  Carbohydrate restrictions and exercise regimen discussed in detail with her.  The above lifestyle nutrition will help with weight management.  - I advised patient to maintain close follow up with [employer], MD for primary care needs.   I spent 30 minutes in the care of the patient today including review of labs from [facility], Lipids, Thyroid Function, Hematology (current and previous including abstractions from other facilities); face-to-face time discussing  her blood glucose readings/logs, discussing hypoglycemia and hyperglycemia episodes and symptoms, medications doses, her options of short and long term treatment based on the latest standards of care / guidelines;  discussion about incorporating lifestyle medicine;  and documenting the encounter. Risk reduction counseling performed per USPSTF guidelines to reduce  obesity and cardiovascular risk factors.     Please refer to Patient Instructions for Blood Glucose Monitoring and Insulin/Medications Dosing Guide"  in media tab for additional information. Please  also refer to " Patient Self Inventory" in the Media  tab for reviewed elements of pertinent patient history.  [name] participated in the discussions, expressed understanding, and voiced agreement with the above plans.  All questions were answered to her satisfaction. she is encouraged to contact clinic should she have any questions or concerns prior to her return visit.     Follow up plan: - Return in about 3 months (around 06/24/2022) for F/U with Pre-visit Labs, Meter/CGM/Logs, A1c here.  [name], [facility] [address] [place], [place] [address] Phone: [phone] Fax: [phone]  03/25/2022,  12:42 PM

## 2022-03-28 ENCOUNTER — Other Ambulatory Visit: Payer: Self-pay | Admitting: "Endocrinology

## 2022-04-08 NOTE — Progress Notes (Unsigned)
Cardiology Clinic Note   Patient Name: [name] Date of Encounter: 04/09/2022  Primary Care Provider:  Redmond School, MD Primary Cardiologist:  Carlyle Dolly, MD  Patient Profile    57 year old female with a history of hypertension, hyperlipidemia, insulin-dependent diabetes, palpitations, CVA, sleep apnea, multiple sclerosis, who is here today for follow-up  Past Medical History    Past Medical History:  Diagnosis Date   Asthma    Chronic abdominal pain    Chronic neck pain    C4-6 fusion   CONSTIPATION 05/16/2009   Qualifier: Diagnosis of  By: Ronnald Ramp FNP-BC, Kandice L    Diabetes mellitus without complication ([place])    Dysphagia 02/23/2012   Fatty liver    GERD (gastroesophageal reflux disease)    Helicobacter pylori gastritis 2011   Hyperlipidemia    Hypertension    LIVER FUNCTION TESTS, ABNORMAL, HX OF 05/14/2009   Qualifier: Diagnosis of  By: Olena Heckle, [place]     Migraine headache    MIGRAINE, COMMON 05/14/2009   Qualifier: Diagnosis of  By: Stephan Minister     MS (multiple sclerosis) [facility])    Sleep apnea    Past Surgical History:  Procedure Laterality Date   ABDOMINAL HYSTERECTOMY     APPENDECTOMY     BACK SURGERY  03/07/2012   Nerve Stimulator   CHOLECYSTECTOMY     COLONOSCOPY  07/09/2009   RMR; normal rectum aside from anal canal hemorrhoids/scattered left-sided diverticula   ESOPHAGOGASTRODUODENOSCOPY  05/22/2009   RMR; normal /small HH   ESOPHAGOGASTRODUODENOSCOPY  2007   RMR: non-critical Schatzki's rin, non-maniuplated   ESOPHAGOGASTRODUODENOSCOPY N/A 10/24/2013   Procedure: ESOPHAGOGASTRODUODENOSCOPY (EGD);  Surgeon: Daneil Dolin, MD;  Location: [facility];  Service: Endoscopy;  Laterality: N/A;  9:45   ESOPHAGOGASTRODUODENOSCOPY (EGD) WITH ESOPHAGEAL DILATION  03/17/2012   UYQ:[id number] appearing esophageal mucosa of uncertain significance. Status post Venia Minks dilation followed by esophageal bx   MALONEY DILATION N/A 10/24/2013    Procedure: Venia Minks DILATION;  Surgeon: Daneil Dolin, MD;  Location: [facility];  Service: Endoscopy;  Laterality: N/A;   MANDIBLE SURGERY     NECK SURGERY     X2, last time 2013    Allergies  Allergies  Allergen Reactions   Peanut-Containing Drug Products Anaphylaxis and Hives    Patient states she is allergic to all nuts   Shellfish Allergy Anaphylaxis   Gluten Meal    Latex Other (See Comments)    Reaction: blistering   Prednisone Hives and Other (See Comments)    Reaction: causes psychological issues    Nexium [Esomeprazole Magnesium] Hives   Tape Rash    Paper tape please    History of Present Illness    [name] is a 57 year old female with past medical history of CVA/TIA, migraines, MS, hypertension, hyperlipidemia, diabetes, anxiety, asthma, fatty liver disease, sleep apnea.  Echocardiogram completed 02/2019 revealed LVEF of 60 to 65%, mildly increased left ventricular hypertrophy, G1 DD, no regional wall motion abnormality's, and no valvular abnormalities.  Carotid duplex completed 05/2019 revealed a carotid intimal thickening without significant atherosclerosis.  No hemodynamically significant ICA stenosis.  Degree of narrowing is less than 50% bilaterally by ultrasound.  She was last seen in clinic 11/21 by Estella Husk, PA and from a cardiac standpoint was doing fairly well.  She is having issues remembering to take her metoprolol and was noted to have elevated heart rate on the day of exam.  Her metoprolol tartrate was discontinued and she  was changed to metoprolol succinate 50 mg once daily for better adherence.  There was no further testing of labs that was ordered.  She was lost to follow-up since 2021 and per chart review has had multiple visits to urgent care in the emergency department for falls and hypoglycemia.  Last hospitalization admission occurred 11/28/2021 where she presented to the [facility] emergency department status post fall and near syncope.  Patient  stated she had multiple episodes of feeling as though she was going to pass out and she had had 3 falls within 2 days. During her hospitalization she was found to be septic secondary to gram-negative bacteremia felt to be related to possible pyelonephritis.  She was treated with broad-spectrum antibiotics.  Medications were slowly added back to her daily regimen and she was considered stable for discharge on 12/02/2021.  She returns to clinic today for follow-up.  She states for the most part that she has been doing well.  She also states that she has increased amount of palpitations and tachycardia because unfortunately she has been off of her metoprolol XL for approximately 6 months or longer.  She continues to have bilateral leg pain associated with her multiple sclerosis.  She does have upcoming follow-up with her MS provider at [facility].  She denies any current chest pain or shortness of breath.  She also states she had recent hospitalization in September for sepsis.  Home Medications    Current Outpatient Medications  Medication Sig Dispense Refill   ACCU-CHEK GUIDE test strip USE TO CHECK BLOOD SUGAR 4 TIMES DAILY AS DIRECTED 100 strip 0   Accu-Chek Softclix Lancets lancets USE TO CHECK BLOOD SUGAR 4 TIMES DAILY AS DIRECTED 100 each 0   albuterol (ACCUNEB) 0.63 MG/3ML nebulizer solution Take 1 ampule by nebulization every 6 (six) hours as needed for wheezing or shortness of breath.     albuterol (VENTOLIN HFA) 108 (90 Base) MCG/ACT inhaler Inhale 1-2 puffs into the lungs every 6 (six) hours as needed for wheezing or shortness of breath.      baclofen (LIORESAL) 20 MG tablet Take 20 mg by mouth 3 (three) times daily.   3   blood glucose meter kit and supplies KIT Dispense based on patient and insurance preference. Use up to four times daily as directed. (FOR ICD-9 250.00, 250.01). 1 each 0   Blood Glucose Monitoring Suppl (ACCU-CHEK GUIDE ME) w/Device KIT USE TO CHECK BLOOD SUGAR DAILY AS DIRECTED 1  kit 0   citalopram (CELEXA) 40 MG tablet Take 40 mg by mouth every evening.      clopidogrel (PLAVIX) 75 MG tablet Take 75 mg by mouth daily.     Continuous Blood Gluc Receiver (DEXCOM G7 RECEIVER) DEVI Use to check glucose as directed 1 each 0   Continuous Blood Gluc Sensor (DEXCOM G7 SENSOR) MISC Change sensor every 10 days 3 each 2   gabapentin (NEURONTIN) 800 MG tablet Take 800 mg by mouth 4 (four) times daily.     insulin isophane & regular human KwikPen (NOVOLIN 70/30 KWIKPEN) (70-30) 100 UNIT/ML KwikPen Inject 10 Units into the skin 2 (two) times daily before a meal. 45 mL 2   Insulin Pen Needle (B-D ULTRAFINE III SHORT PEN) 31G X 8 MM MISC USE TO INJECT INSULIN 2 TIMES DAILY 100 each 5   lisinopril (ZESTRIL) 5 MG tablet TAKE ONE TABLET BY MOUTH ONCE DAILY. 30 tablet 0   metFORMIN (GLUCOPHAGE) 500 MG tablet Take 2 tablets (1,000 mg total) by mouth 2 (  two) times daily. 180 tablet 1   ondansetron (ZOFRAN) 4 MG tablet Take 4 mg by mouth 4 (four) times daily as needed for nausea or vomiting.     oxycodone (ROXICODONE) 30 MG immediate release tablet Take 15 mg by mouth every 6 (six) hours as needed for pain.     pravastatin (PRAVACHOL) 40 MG tablet Take 1 tablet (40 mg total) by mouth every evening. 30 tablet 1   rOPINIRole (REQUIP) 0.5 MG tablet Take 0.5 mg by mouth at bedtime.     metoprolol succinate (TOPROL-XL) 50 MG 24 hr tablet Take 1 tablet (50 mg total) by mouth daily. Take with or immediately following a meal. 90 tablet 3   No current facility-administered medications for this visit.     Family History    Family History  Problem Relation Age of Onset   Cancer Mother        unknown type   Diabetes Father    Diabetes Sister    Colon cancer Neg Hx    She indicated that her mother is deceased. She indicated that her father is deceased. She indicated that her sister is alive. She indicated that the status of her neg hx is unknown.  Social History    Social History    Socioeconomic History   Marital status: Married    Spouse name: Not on file   Number of children: Not on file   Years of education: Not on file   Highest education level: Not on file  Occupational History   Not on file  Tobacco Use   Smoking status: Never   Smokeless tobacco: Never  Vaping Use   Vaping Use: Never used  Substance and Sexual Activity   Alcohol use: Not Currently   Drug use: No   Sexual activity: Yes    Birth control/protection: Surgical  Other Topics Concern   Not on file  Social History Narrative   5 kids, raising grandchild   Social Determinants of Health   Financial Resource Strain: Not on file  Food Insecurity: Not on file  Transportation Needs: Not on file  Physical Activity: Not on file  Stress: Not on file  Social Connections: Not on file  Intimate Partner Violence: Not on file     Review of Systems    General:  No chills, fever, night sweats or weight changes.  Endorses fatigue Cardiovascular:  No chest pain, dyspnea on exertion, edema, orthopnea, endorses palpitations, paroxysmal nocturnal dyspnea. Dermatological: No rash, lesions/masses Respiratory: No cough, dyspnea Urologic: No hematuria, dysuria Musculoskeletal: Endorses bilateral leg pain Abdominal:   No nausea, vomiting, diarrhea, bright red blood per rectum, melena, or hematemesis Neurologic:  No visual changes, wkns, changes in mental status. All other systems reviewed and are otherwise negative except as noted above.     Physical Exam    VS:  BP 132/70   Pulse 98   Ht 5\' 5"  (1.651 m)   Wt 191 lb 9.6 oz (86.9 kg)   SpO2 96%   BMI 31.88 kg/m  , BMI Body mass index is 31.88 kg/m.     GEN: Well nourished, well developed, in no acute distress. HEENT: normal. Neck: Supple, no JVD, carotid bruits, or masses. Cardiac: RRR, tachycardic, I/VI murmur, without rubs, or gallops. No clubbing, cyanosis, edema.  Radials 2+/PT 2+ and equal bilaterally.  Respiratory:  Respirations  regular and unlabored, clear to auscultation bilaterally. GI: Soft, nontender, nondistended, BS + x 4. MS: no deformity or atrophy. Skin: warm and dry,  no rash. Neuro:  Strength and sensation are intact. Psych: Normal affect.  Accessory Clinical Findings    ECG personally reviewed by me today-no new tracings  Lab Results  Component Value Date   WBC 6.3 12/01/2021   HGB 8.6 (L) 12/01/2021   HCT 27.6 (L) 12/01/2021   MCV 78.2 (L) 12/01/2021   PLT 173 12/01/2021   Lab Results  Component Value Date   CREATININE 0.74 01/15/2022   BUN 12 01/15/2022   NA 141 01/15/2022   K 4.1 01/15/2022   CL 106 01/15/2022   CO2 22 01/15/2022   Lab Results  Component Value Date   ALT 18 01/15/2022   AST 26 01/15/2022   ALKPHOS 109 01/15/2022   BILITOT <0.2 01/15/2022   Lab Results  Component Value Date   CHOL 121 01/15/2022   HDL 39 (L) 01/15/2022   LDLCALC 64 01/15/2022   TRIG 93 01/15/2022   CHOLHDL 3.1 01/15/2022    Lab Results  Component Value Date   HGBA1C 7.0 03/25/2022    Assessment & Plan   1.  Palpitations with tachycardia with heart rate today of 98.  Patient states that she has been out of her Toprol-XL for greater than 6 months.  She has been restarted on Toprol-XL today 50 mg daily with a refill sent into the patient's pharmacy of choice.  If she continues to have recurrent symptoms after reinitiation of previous medication can reconsider a ZIO monitor to rule out any other arrhythmias.  2.  Hypertension with blood pressure today 132/70.  Remained stable.  She is continued on her lisinopril 5 mg daily and reinitiated on Toprol-XL 50 mg daily.  3.  Mixed hyperlipidemia with LDL of 6410/19/23.  She is continued on 40 mg of pravastatin.  This continues to be monitored by her PCP.  4.  History of CVA in 02/2019.  She is continued on clopidogrel 75 mg daily.  5.  Type 2 diabetes with a hemoglobin A1c of 7.0 03/25/2022.  This continues to be managed by her  endocrinologist.  6.  Disposition patient return to clinic to see MD/APP in 4 weeks with an EKG on return to reevaluate restarting her on beta-blocker therapy.  Keatyn Jawad, NP 04/09/2022, 3:02 PM

## 2022-04-09 ENCOUNTER — Ambulatory Visit: Payer: BC Managed Care – PPO | Attending: Cardiology | Admitting: Cardiology

## 2022-04-09 ENCOUNTER — Encounter: Payer: Self-pay | Admitting: Cardiology

## 2022-04-09 VITALS — BP 132/70 | HR 98 | Ht 65.0 in | Wt 191.6 lb

## 2022-04-09 DIAGNOSIS — Z8673 Personal history of transient ischemic attack (TIA), and cerebral infarction without residual deficits: Secondary | ICD-10-CM

## 2022-04-09 DIAGNOSIS — Z794 Long term (current) use of insulin: Secondary | ICD-10-CM

## 2022-04-09 DIAGNOSIS — R000 Tachycardia, unspecified: Secondary | ICD-10-CM

## 2022-04-09 DIAGNOSIS — R002 Palpitations: Secondary | ICD-10-CM | POA: Diagnosis not present

## 2022-04-09 DIAGNOSIS — E782 Mixed hyperlipidemia: Secondary | ICD-10-CM

## 2022-04-09 DIAGNOSIS — I10 Essential (primary) hypertension: Secondary | ICD-10-CM

## 2022-04-09 DIAGNOSIS — E119 Type 2 diabetes mellitus without complications: Secondary | ICD-10-CM

## 2022-04-09 MED ORDER — METOPROLOL SUCCINATE ER 50 MG PO TB24: 50.0000 mg | ORAL_TABLET | Freq: Every day | ORAL | 3 refills | Status: DC

## 2022-04-09 NOTE — Patient Instructions (Signed)
Medication Instructions:   Restart Toprol XL 50 mg Daily   *If you need a refill on your cardiac medications before your next appointment, please call your pharmacy*   Lab Work: NONE   If you have labs (blood work) drawn today and your tests are completely normal, you will receive your results only by: [place] (if you have MyChart) OR A paper copy in the mail If you have any lab test that is abnormal or we need to change your treatment, we will call you to review the results.   Testing/Procedures: NONE    Follow-Up: At [facility], you and your health needs are our priority.  As part of our continuing mission to provide you with exceptional heart care, we have created designated Provider Care Teams.  These Care Teams include your primary Cardiologist (physician) and Advanced Practice Providers (APPs -  Physician Assistants and Nurse Practitioners) who all work together to provide you with the care you need, when you need it.  We recommend signing up for the patient portal called "MyChart".  Sign up information is provided on this After Visit Summary.  MyChart is used to connect with patients for Virtual Visits (Telemedicine).  Patients are able to view lab/test results, encounter notes, upcoming appointments, etc.  Non-urgent messages can be sent to your provider as well.   To learn more about what you can do with MyChart, go to [email or web address].    Your next appointment:   1 month(s)  Provider:   You may see Carlyle Dolly, MD or one of the following Advanced Practice Providers on your designated Care Team:   Bernerd Pho, PA-C  Ermalinda Barrios, [place]     Other Instructions Thank you for choosing [facility]!

## 2022-04-19 ENCOUNTER — Emergency Department (HOSPITAL_COMMUNITY): Payer: BC Managed Care – PPO

## 2022-04-19 ENCOUNTER — Other Ambulatory Visit: Payer: Self-pay

## 2022-04-19 ENCOUNTER — Inpatient Hospital Stay (HOSPITAL_COMMUNITY)
Admission: EM | Admit: 2022-04-19 | Discharge: 2022-04-25 | DRG: 871 | Disposition: A | Discharge status: Home / Self Care | Payer: BC Managed Care – PPO | Attending: Family Medicine | Admitting: Family Medicine

## 2022-04-19 DIAGNOSIS — K219 Gastro-esophageal reflux disease without esophagitis: Secondary | ICD-10-CM | POA: Diagnosis present

## 2022-04-19 DIAGNOSIS — Z91013 Allergy to seafood: Secondary | ICD-10-CM

## 2022-04-19 DIAGNOSIS — Z91040 Latex allergy status: Secondary | ICD-10-CM

## 2022-04-19 DIAGNOSIS — D500 Iron deficiency anemia secondary to blood loss (chronic): Secondary | ICD-10-CM

## 2022-04-19 DIAGNOSIS — E876 Hypokalemia: Secondary | ICD-10-CM | POA: Diagnosis present

## 2022-04-19 DIAGNOSIS — A419 Sepsis, unspecified organism: Principal | ICD-10-CM | POA: Diagnosis present

## 2022-04-19 DIAGNOSIS — K264 Chronic or unspecified duodenal ulcer with hemorrhage: Secondary | ICD-10-CM | POA: Diagnosis present

## 2022-04-19 DIAGNOSIS — Z7902 Long term (current) use of antithrombotics/antiplatelets: Secondary | ICD-10-CM

## 2022-04-19 DIAGNOSIS — Z833 Family history of diabetes mellitus: Secondary | ICD-10-CM

## 2022-04-19 DIAGNOSIS — K5731 Diverticulosis of large intestine without perforation or abscess with bleeding: Secondary | ICD-10-CM | POA: Diagnosis present

## 2022-04-19 DIAGNOSIS — Z888 Allergy status to other drugs, medicaments and biological substances status: Secondary | ICD-10-CM

## 2022-04-19 DIAGNOSIS — K2981 Duodenitis with bleeding: Secondary | ICD-10-CM | POA: Diagnosis present

## 2022-04-19 DIAGNOSIS — I10 Essential (primary) hypertension: Secondary | ICD-10-CM | POA: Diagnosis present

## 2022-04-19 DIAGNOSIS — R55 Syncope and collapse: Secondary | ICD-10-CM | POA: Diagnosis not present

## 2022-04-19 DIAGNOSIS — Z1152 Encounter for screening for COVID-19: Secondary | ICD-10-CM

## 2022-04-19 DIAGNOSIS — K449 Diaphragmatic hernia without obstruction or gangrene: Secondary | ICD-10-CM | POA: Diagnosis present

## 2022-04-19 DIAGNOSIS — N179 Acute kidney failure, unspecified: Secondary | ICD-10-CM | POA: Diagnosis present

## 2022-04-19 DIAGNOSIS — K529 Noninfective gastroenteritis and colitis, unspecified: Secondary | ICD-10-CM | POA: Diagnosis present

## 2022-04-19 DIAGNOSIS — N390 Urinary tract infection, site not specified: Secondary | ICD-10-CM | POA: Diagnosis present

## 2022-04-19 DIAGNOSIS — E669 Obesity, unspecified: Secondary | ICD-10-CM | POA: Diagnosis present

## 2022-04-19 DIAGNOSIS — K649 Unspecified hemorrhoids: Secondary | ICD-10-CM | POA: Diagnosis present

## 2022-04-19 DIAGNOSIS — Z6830 Body mass index (BMI) 30.0-30.9, adult: Secondary | ICD-10-CM

## 2022-04-19 DIAGNOSIS — Z91010 Allergy to peanuts: Secondary | ICD-10-CM

## 2022-04-19 DIAGNOSIS — K254 Chronic or unspecified gastric ulcer with hemorrhage: Secondary | ICD-10-CM | POA: Diagnosis present

## 2022-04-19 DIAGNOSIS — E1165 Type 2 diabetes mellitus with hyperglycemia: Secondary | ICD-10-CM | POA: Diagnosis present

## 2022-04-19 DIAGNOSIS — K2951 Unspecified chronic gastritis with bleeding: Secondary | ICD-10-CM | POA: Diagnosis present

## 2022-04-19 DIAGNOSIS — Z7984 Long term (current) use of oral hypoglycemic drugs: Secondary | ICD-10-CM

## 2022-04-19 DIAGNOSIS — K7460 Unspecified cirrhosis of liver: Secondary | ICD-10-CM | POA: Diagnosis present

## 2022-04-19 DIAGNOSIS — E782 Mixed hyperlipidemia: Secondary | ICD-10-CM | POA: Diagnosis present

## 2022-04-19 DIAGNOSIS — Z794 Long term (current) use of insulin: Secondary | ICD-10-CM

## 2022-04-19 DIAGNOSIS — R7989 Other specified abnormal findings of blood chemistry: Secondary | ICD-10-CM | POA: Insufficient documentation

## 2022-04-19 DIAGNOSIS — G35 Multiple sclerosis: Secondary | ICD-10-CM | POA: Diagnosis present

## 2022-04-19 DIAGNOSIS — D62 Acute posthemorrhagic anemia: Secondary | ICD-10-CM | POA: Diagnosis not present

## 2022-04-19 DIAGNOSIS — G4730 Sleep apnea, unspecified: Secondary | ICD-10-CM | POA: Diagnosis present

## 2022-04-19 DIAGNOSIS — Z91048 Other nonmedicinal substance allergy status: Secondary | ICD-10-CM

## 2022-04-19 DIAGNOSIS — Z79899 Other long term (current) drug therapy: Secondary | ICD-10-CM

## 2022-04-19 DIAGNOSIS — R6520 Severe sepsis without septic shock: Secondary | ICD-10-CM | POA: Diagnosis present

## 2022-04-19 LAB — COMPREHENSIVE METABOLIC PANEL
ALT: 18 U/L (ref 0–44)
AST: 26 U/L (ref 15–41)
Albumin: 3 g/dL — ABNORMAL LOW (ref 3.5–5.0)
Alkaline Phosphatase: 125 U/L (ref 38–126)
Anion gap: 11 (ref 5–15)
BUN: 21 mg/dL — ABNORMAL HIGH (ref 6–20)
CO2: 21 mmol/L — ABNORMAL LOW (ref 22–32)
Calcium: 8.5 mg/dL — ABNORMAL LOW (ref 8.9–10.3)
Chloride: 101 mmol/L (ref 98–111)
Creatinine, Ser: 1.51 mg/dL — ABNORMAL HIGH (ref 0.44–1.00)
GFR, Estimated: 40 mL/min — ABNORMAL LOW (ref >60)
Glucose, Bld: 211 mg/dL — ABNORMAL HIGH (ref 70–99)
Potassium: 3.4 mmol/L — ABNORMAL LOW (ref 3.5–5.1)
Sodium: 133 mmol/L — ABNORMAL LOW (ref 135–145)
Total Bilirubin: 0.1 mg/dL — ABNORMAL LOW (ref 0.3–1.2)
Total Protein: 7.6 g/dL (ref 6.5–8.1)

## 2022-04-19 LAB — CBC WITH DIFFERENTIAL/PLATELET
Abs Immature Granulocytes: 0.08 10*3/uL — ABNORMAL HIGH (ref 0.00–0.07)
Basophils Absolute: 0.1 10*3/uL (ref 0.0–0.1)
Basophils Relative: 1 %
Eosinophils Absolute: 0.3 10*3/uL (ref 0.0–0.5)
Eosinophils Relative: 2 %
HCT: 31.5 % — ABNORMAL LOW (ref 36.0–46.0)
Hemoglobin: 9.2 g/dL — ABNORMAL LOW (ref 12.0–15.0)
Immature Granulocytes: 1 %
Lymphocytes Relative: 21 %
Lymphs Abs: 3.2 10*3/uL (ref 0.7–4.0)
MCH: 22.5 pg — ABNORMAL LOW (ref 26.0–34.0)
MCHC: 29.2 g/dL — ABNORMAL LOW (ref 30.0–36.0)
MCV: 77 fL — ABNORMAL LOW (ref 80.0–100.0)
Monocytes Absolute: 0.4 10*3/uL (ref 0.1–1.0)
Monocytes Relative: 3 %
Neutro Abs: 11.1 10*3/uL — ABNORMAL HIGH (ref 1.7–7.7)
Neutrophils Relative %: 72 %
Platelets: 282 10*3/uL (ref 150–400)
RBC: 4.09 MIL/uL (ref 3.87–5.11)
RDW: 15.7 % — ABNORMAL HIGH (ref 11.5–15.5)
WBC: 15.2 10*3/uL — ABNORMAL HIGH (ref 4.0–10.5)
nRBC: 0 % (ref 0.0–0.2)

## 2022-04-19 LAB — LACTIC ACID, PLASMA: Lactic Acid, Venous: 3.1 mmol/L (ref 0.5–1.9)

## 2022-04-19 LAB — PROTIME-INR
INR: 1 (ref 0.8–1.2)
Prothrombin Time: 13.3 seconds (ref 11.4–15.2)

## 2022-04-19 LAB — I-STAT CHEM 8, ED
BUN: 19 mg/dL (ref 6–20)
Calcium, Ion: 1.14 mmol/L — ABNORMAL LOW (ref 1.15–1.40)
Chloride: 103 mmol/L (ref 98–111)
Creatinine, Ser: 1.6 mg/dL — ABNORMAL HIGH (ref 0.44–1.00)
Glucose, Bld: 195 mg/dL — ABNORMAL HIGH (ref 70–99)
HCT: 29 % — ABNORMAL LOW (ref 36.0–46.0)
Hemoglobin: 9.9 g/dL — ABNORMAL LOW (ref 12.0–15.0)
Potassium: 3.7 mmol/L (ref 3.5–5.1)
Sodium: 138 mmol/L (ref 135–145)
TCO2: 22 mmol/L (ref 22–32)

## 2022-04-19 LAB — RESP PANEL BY RT-PCR (RSV, FLU A&B, COVID)  RVPGX2
Influenza A by PCR: NEGATIVE
Influenza B by PCR: NEGATIVE
Resp Syncytial Virus by PCR: NEGATIVE
SARS Coronavirus 2 by RT PCR: NEGATIVE

## 2022-04-19 LAB — TROPONIN I (HIGH SENSITIVITY): Troponin I (High Sensitivity): 12 ng/L (ref <18)

## 2022-04-19 LAB — MAGNESIUM: Magnesium: 2.2 mg/dL (ref 1.7–2.4)

## 2022-04-19 MED ORDER — METRONIDAZOLE 500 MG/100ML IV SOLN
500.0000 mg | Freq: Once | INTRAVENOUS | Status: AC
  Administered 2022-04-19: 500 mg via INTRAVENOUS
  Filled 2022-04-19: qty 100

## 2022-04-19 MED ORDER — VANCOMYCIN HCL IN DEXTROSE 1-5 GM/200ML-% IV SOLN
1000.0000 mg | Freq: Once | INTRAVENOUS | Status: AC
  Administered 2022-04-19: 1000 mg via INTRAVENOUS
  Filled 2022-04-19: qty 200

## 2022-04-19 MED ORDER — LACTATED RINGERS IV BOLUS
500.0000 mL | Freq: Once | INTRAVENOUS | Status: AC
  Administered 2022-04-19: 500 mL via INTRAVENOUS

## 2022-04-19 MED ORDER — LACTATED RINGERS IV BOLUS (SEPSIS)
1000.0000 mL | Freq: Once | INTRAVENOUS | Status: AC
  Administered 2022-04-19: 1000 mL via INTRAVENOUS

## 2022-04-19 MED ORDER — LACTATED RINGERS IV SOLN: INTRAVENOUS | Status: AC

## 2022-04-19 MED ORDER — SODIUM CHLORIDE 0.9 % IV SOLN
2.0000 g | Freq: Once | INTRAVENOUS | Status: AC
  Administered 2022-04-19: 2 g via INTRAVENOUS
  Filled 2022-04-19: qty 12.5

## 2022-04-19 MED ORDER — VANCOMYCIN HCL 500 MG/100ML IV SOLN
500.0000 mg | Freq: Once | INTRAVENOUS | Status: AC
  Administered 2022-04-19: 500 mg via INTRAVENOUS
  Filled 2022-04-19 (×2): qty 100

## 2022-04-19 MED ORDER — LACTATED RINGERS IV BOLUS
1000.0000 mL | Freq: Once | INTRAVENOUS | Status: AC
  Administered 2022-04-19: 1000 mL via INTRAVENOUS

## 2022-04-19 NOTE — ED Triage Notes (Signed)
Pt has hx of IDDM, and previous stroke. Was in the bathroom at her home and had a syncopal episode. Denies hitting any body parts in her fall. C/O low back ain, not of new onset.

## 2022-04-19 NOTE — ED Provider Notes (Signed)
[facility] Provider Note   CSN: [id number] Arrival date & time: 04/19/22  2104     History  Chief Complaint  Patient presents with   Near Syncope    Near syncopal episode in the bathroom this evening.     [name] is a 57 y.o. female.   Near Syncope  Patient presents for syncope, fatigue, generalized weakness.  She states that she was in her normal state of health earlier today.  This evening, early prior to arrival, she was walking to the bathroom.  She felt very weak and lightheaded.  She is unsure if she fully passed out.  She did fall to the floor.  She denies any areas of pain since the fall.  She was helped up by her son.  She has had ongoing fatigue and generalized weakness since then.  EMS noted saw blood pressures on scene which further decline during transit.  No interventions were given prior to arrival.  Patient currently endorses ongoing fatigue.  She does state that she has had lower back pain and dysuria.  She takes no for hypertension.  She did take her dose this evening around 6 PM.     Home Medications Prior to Admission medications   Medication Sig Start Date End Date Taking? Authorizing Provider  ACCU-CHEK GUIDE test strip USE TO CHECK BLOOD SUGAR 4 TIMES DAILY AS DIRECTED 03/06/22   Nida, Marella Chimes, MD  Accu-Chek Softclix Lancets lancets USE TO CHECK BLOOD SUGAR 4 TIMES DAILY AS DIRECTED 03/06/22   Nida, Marella Chimes, MD  albuterol (ACCUNEB) 0.63 MG/3ML nebulizer solution Take 1 ampule by nebulization every 6 (six) hours as needed for wheezing or shortness of breath. 11/24/21   [provider]  albuterol (VENTOLIN HFA) 108 (90 Base) MCG/ACT inhaler Inhale 1-2 puffs into the lungs every 6 (six) hours as needed for wheezing or shortness of breath.  01/02/19   [provider]  baclofen (LIORESAL) 20 MG tablet Take 20 mg by mouth 3 (three) times daily.  05/08/14   [provider]  blood  glucose meter kit and supplies KIT Dispense based on patient and insurance preference. Use up to four times daily as directed. (FOR ICD-9 250.00, 250.01). 03/15/19   Geradine Girt, DO  Blood Glucose Monitoring Suppl (ACCU-CHEK GUIDE ME) w/Device KIT USE TO CHECK BLOOD SUGAR DAILY AS DIRECTED 12/31/21   Nida, Marella Chimes, MD  citalopram (CELEXA) 40 MG tablet Take 40 mg by mouth every evening.     [provider]  clopidogrel (PLAVIX) 75 MG tablet Take 75 mg by mouth daily. 11/27/21   [provider]  Continuous Blood Gluc Receiver (DEXCOM G7 RECEIVER) DEVI Use to check glucose as directed 10/28/21   Cassandria Anger, MD  Continuous Blood Gluc Sensor (DEXCOM G7 SENSOR) MISC Change sensor every 10 days 01/19/22   Cassandria Anger, MD  gabapentin (NEURONTIN) 800 MG tablet Take 800 mg by mouth 4 (four) times daily. 01/02/19   [provider]  insulin isophane & regular human KwikPen (NOVOLIN 70/30 KWIKPEN) (70-30) 100 UNIT/ML KwikPen Inject 10 Units into the skin 2 (two) times daily before a meal. 12/02/21   Kathie Dike, MD  Insulin Pen Needle (B-D ULTRAFINE III SHORT PEN) 31G X 8 MM MISC USE TO INJECT INSULIN 2 TIMES DAILY 07/16/21   Nida, Marella Chimes, MD  lisinopril (ZESTRIL) 5 MG tablet TAKE ONE TABLET BY MOUTH ONCE DAILY. 03/31/22   Loni Beckwith  W, MD  metFORMIN (GLUCOPHAGE) 500 MG tablet Take 2 tablets (1,000 mg total) by mouth 2 (two) times daily. 10/15/21   Cassandria Anger, MD  metoprolol succinate (TOPROL-XL) 50 MG 24 hr tablet Take 1 tablet (50 mg total) by mouth daily. Take with or immediately following a meal. 04/09/22 04/04/23  Hammock, Barbera Setters, NP  ondansetron (ZOFRAN) 4 MG tablet Take 4 mg by mouth 4 (four) times daily as needed for nausea or vomiting. 03/06/19   [provider]  oxycodone (ROXICODONE) 30 MG immediate release tablet Take 15 mg by mouth every 6 (six) hours as needed for pain. 10/31/21   [provider]  pravastatin  (PRAVACHOL) 40 MG tablet Take 1 tablet (40 mg total) by mouth every evening. 03/15/19   Black, Lezlie Octave, NP  rOPINIRole (REQUIP) 0.5 MG tablet Take 0.5 mg by mouth at bedtime. 11/27/21   [provider]      Allergies    Peanut-containing drug products, Shellfish allergy, Gluten meal, Latex, Prednisone, Nexium [esomeprazole magnesium], and Tape    Review of Systems   Review of Systems  Constitutional:  Positive for fatigue.  Cardiovascular:  Positive for near-syncope.  Genitourinary:  Positive for dysuria.  Musculoskeletal:  Positive for back pain.  Neurological:  Positive for syncope and weakness (Generalized).  All other systems reviewed and are negative.   Physical Exam Updated Vital Signs BP (!) 110/50   Pulse 99   Temp (!) 95.3 F (35.2 C) (Rectal)   Resp 13   Ht 5\' 5"  (1.651 m)   Wt 83.9 kg   SpO2 94%   BMI 30.79 kg/m  Physical Exam Vitals and nursing note reviewed.  Constitutional:      General: She is not in acute distress.    Appearance: Normal appearance. She is well-developed. She is not toxic-appearing or diaphoretic.  HENT:     Head: Normocephalic and atraumatic.     Right Ear: External ear normal.     Left Ear: External ear normal.     Nose: Nose normal.     Mouth/Throat:     Mouth: Mucous membranes are dry.  Eyes:     Extraocular Movements: Extraocular movements intact.     Conjunctiva/sclera: Conjunctivae normal.  Cardiovascular:     Rate and Rhythm: Normal rate and regular rhythm.     Heart sounds: No murmur heard. Pulmonary:     Effort: Pulmonary effort is normal. No respiratory distress.     Breath sounds: Normal breath sounds. No wheezing or rales.  Chest:     Chest wall: No tenderness.  Abdominal:     General: There is no distension.     Palpations: Abdomen is soft.     Tenderness: There is no abdominal tenderness. There is left CVA tenderness. There is no right CVA tenderness.  Musculoskeletal:        General: No swelling. Normal  range of motion.     Cervical back: Normal range of motion and neck supple.     Right lower leg: No edema.     Left lower leg: No edema.  Skin:    General: Skin is warm and dry.     Capillary Refill: Capillary refill takes less than 2 seconds.     Coloration: Skin is not jaundiced or pale.  Neurological:     General: No focal deficit present.     Mental Status: She is alert and oriented to person, place, and time.     Cranial Nerves: No cranial  nerve deficit.     Sensory: No sensory deficit.     Motor: No weakness.     Coordination: Coordination normal.  Psychiatric:        Mood and Affect: Mood normal.        Behavior: Behavior normal.        Thought Content: Thought content normal.        Judgment: Judgment normal.     ED Results / Procedures / Treatments   Labs (all labs ordered are listed, but only abnormal results are displayed) Labs Reviewed  LACTIC ACID, PLASMA - Abnormal; Notable for the following components:      Result Value   Lactic Acid, Venous 3.1 (*)    All other components within normal limits  COMPREHENSIVE METABOLIC PANEL - Abnormal; Notable for the following components:   Sodium 133 (*)    Potassium 3.4 (*)    CO2 21 (*)    Glucose, Bld 211 (*)    BUN 21 (*)    Creatinine, Ser 1.51 (*)    Calcium 8.5 (*)    Albumin 3.0 (*)    Total Bilirubin 0.1 (*)    GFR, Estimated 40 (*)    All other components within normal limits  CBC WITH DIFFERENTIAL/PLATELET - Abnormal; Notable for the following components:   WBC 15.2 (*)    Hemoglobin 9.2 (*)    HCT 31.5 (*)    MCV 77.0 (*)    MCH 22.5 (*)    MCHC 29.2 (*)    RDW 15.7 (*)    Neutro Abs 11.1 (*)    Abs Immature Granulocytes 0.08 (*)    All other components within normal limits  I-STAT CHEM 8, ED - Abnormal; Notable for the following components:   Creatinine, Ser 1.60 (*)    Glucose, Bld 195 (*)    Calcium, Ion 1.14 (*)    Hemoglobin 9.9 (*)    HCT 29.0 (*)    All other components within normal  limits  RESP PANEL BY RT-PCR (RSV, FLU A&B, COVID)  RVPGX2  CULTURE, BLOOD (ROUTINE X 2)  CULTURE, BLOOD (ROUTINE X 2)  URINE CULTURE  PROTIME-INR  MAGNESIUM  LACTIC ACID, PLASMA  URINALYSIS, ROUTINE W REFLEX MICROSCOPIC  TROPONIN I (HIGH SENSITIVITY)  TROPONIN I (HIGH SENSITIVITY)    EKG EKG Interpretation  Date/Time:  Sunday April 19 2022 21:15:14 EST Ventricular Rate:  102 PR Interval:  148 QRS Duration: 91 QT Interval:  401 QTC Calculation: 523 R Axis:   8 Text Interpretation: Sinus tachycardia Prolonged QT interval Confirmed by Gloris Manchester [phone]) on 04/19/2022 10:46:34 PM  Radiology DG Chest Port 1 View  Result Date: 04/19/2022 CLINICAL DATA:  Possible sepsis. EXAM: PORTABLE CHEST 1 VIEW COMPARISON:  11/28/2021. FINDINGS: The heart is enlarged and the mediastinal contour is within normal limits. Lung volumes are low and there is chronic elevation of the right diaphragm. No consolidation, effusion, or pneumothorax. Neurostimulator leads are present over the midline. Cervical spinal fusion hardware is noted. IMPRESSION: No active disease. Electronically Signed   By: Thornell Sartorius M.D.   On: 04/19/2022 21:49    Procedures Procedures    Medications Ordered in ED Medications  lactated ringers infusion ( Intravenous New Bag/Given 04/19/22 2211)  vancomycin (VANCOREADY) IVPB 500 mg/100 mL (500 mg Intravenous New Bag/Given 04/19/22 2324)  lactated ringers bolus 1,000 mL (0 mLs Intravenous Stopped 04/19/22 2156)  ceFEPIme (MAXIPIME) 2 g in sodium chloride 0.9 % 100 mL IVPB (0 g Intravenous Stopped 04/19/22  2238)  metroNIDAZOLE (FLAGYL) IVPB 500 mg (0 mg Intravenous Stopped 04/19/22 2324)  vancomycin (VANCOCIN) IVPB 1000 mg/200 mL premix (0 mg Intravenous Stopped 04/19/22 2335)  lactated ringers bolus 1,000 mL (0 mLs Intravenous Stopped 04/19/22 2248)  lactated ringers bolus 500 mL (0 mLs Intravenous Stopped 04/19/22 2248)    ED Course/ Medical Decision Making/ A&P                              Medical Decision Making Amount and/or Complexity of Data Reviewed Labs: ordered. Radiology: ordered. ECG/medicine tests: ordered.  Risk Prescription drug management.   This patient presents to the ED for concern of syncope, this involves an extensive number of treatment options, and is a complaint that carries with it a high risk of complications and morbidity.  The differential diagnosis includes dehydration, infection, polypharmacy, arrhythmia   Co morbidities that complicate the patient evaluation  Asthma, hepatic steatosis, HTN, HLD, MS, T2DM, obesity, GERD   Additional history obtained:  Additional history obtained from EMS External records from outside source obtained and reviewed including EMR   Lab Tests:  I Ordered, and personally interpreted labs.  The pertinent results include: Leukocytosis and lactic acidosis consistent with sepsis.  AKI is present.   Imaging Studies ordered:  I ordered imaging studies including CT of chest, abdomen, pelvis I independently visualized and interpreted imaging which showed (study pending at time of signout) I agree with the radiologist interpretation   Cardiac Monitoring: / EKG:  The patient was maintained on a cardiac monitor.  I personally viewed and interpreted the cardiac monitored which showed an underlying rhythm of: Sinus rhythm   Problem List / ED Course / Critical interventions / Medication management  Patient presents after a likely syncopal episode.  She is somewhat amnestic to the event but she does remember a prodrome of lightheadedness and generalized weakness.  She did suffer a fall but denies any areas of pain since the fall.  On exam, she does not have any areas of tenderness.  She does arrive hypotensive.  Blood pressure on arrival is 88/44.  She is mildly tachycardic.  Bolus of IV fluids was ordered.  Patient's vital signs and symptoms raise concern of possible sepsis.  Septic workup was initiated.   She does state that she has had recent dysuria and endorses some low back pain.  On exam, she does have left CVA tenderness.  Patient's hypotension may also be, in part, due to blood pressure medication.  She did take lisinopril 3 hours ago.  On rectal temperature, patient was found to be hypothermic.  Broad-spectrum antibiotics were initiated.  Lab work shows leukocytosis and lactic acidosis consistent with sepsis.  Additional IV fluids were ordered.  Following initiation of IV fluids, patient had improvement in blood pressure.  She attempted to urinate but was unable to provide a sample.  Per chart review, she did have gram-negative bacteremia in September of last year.  Given unclear source at this time, CT imaging was ordered.  At time of signout, urine studies and CT imaging were pending.  Care of patient was signed out to oncoming ED provider. I ordered medication including IV fluids and broad-spectrum antibiotics for treatment of sepsis Reevaluation of the patient after these medicines showed that the patient improved I have reviewed the patients home medicines and have made adjustments as needed   Social Determinants of Health:  Lives at home with family  CRITICAL CARE Performed  by: Gloris Manchester   Total critical care time: 32 minutes  Critical care time was exclusive of separately billable procedures and treating other patients.  Critical care was necessary to treat or prevent imminent or life-threatening deterioration.  Critical care was time spent personally by me on the following activities: development of treatment plan with patient and/or surrogate as well as nursing, discussions with consultants, evaluation of patient's response to treatment, examination of patient, obtaining history from patient or surrogate, ordering and performing treatments and interventions, ordering and review of laboratory studies, ordering and review of radiographic studies, pulse oximetry and re-evaluation of  patient's condition.         Final Clinical Impression(s) / ED Diagnoses Final diagnoses:  Sepsis, due to unspecified organism, unspecified whether acute organ dysfunction present [facility])  Syncope and collapse    Rx / DC Orders ED Discharge Orders     None         Gloris Manchester, MD 04/19/22 2337

## 2022-04-19 NOTE — ED Provider Notes (Signed)
  Provider Note MRN:  [id number]  Arrival date & time: 04/20/22    ED Course and Medical Decision Making  Assumed care from Dr. Doren Custard at shift change.  Hypothermia, soft blood pressures, concern for sepsis of unclear source awaiting urinalysis, CT scan.  CT scan unremarkable, urinalysis appears infected, admitted to medicine.  .Critical Care  Performed by: Maudie Flakes, MD Authorized by: Maudie Flakes, MD   Critical care provider statement:    Critical care time (minutes):  36   Critical care was necessary to treat or prevent imminent or life-threatening deterioration of the following conditions:  Sepsis   Critical care was time spent personally by me on the following activities:  Development of treatment plan with patient or surrogate, discussions with consultants, evaluation of patient's response to treatment, examination of patient, ordering and review of laboratory studies, ordering and review of radiographic studies, ordering and performing treatments and interventions, pulse oximetry, re-evaluation of patient's condition and review of old charts   Final Clinical Impressions(s) / ED Diagnoses     ICD-10-CM   1. Sepsis, due to unspecified organism, unspecified whether acute organ dysfunction present ([place])  A41.9     2. Syncope and collapse  R55       ED Discharge Orders     None       Discharge Instructions   None     Barth Kirks. [place] Small, [place] [email or web address]    Maudie Flakes, MD 04/20/22 [id number]

## 2022-04-19 NOTE — ED Notes (Signed)
Glucose monitoring device located in left upper arm was removed at the direction of Radiology Tech.

## 2022-04-19 NOTE — ED Notes (Signed)
Bair hugger applied at this time

## 2022-04-20 ENCOUNTER — Encounter (HOSPITAL_COMMUNITY): Payer: Self-pay | Admitting: Family Medicine

## 2022-04-20 DIAGNOSIS — Z833 Family history of diabetes mellitus: Secondary | ICD-10-CM | POA: Diagnosis not present

## 2022-04-20 DIAGNOSIS — K219 Gastro-esophageal reflux disease without esophagitis: Secondary | ICD-10-CM | POA: Diagnosis present

## 2022-04-20 DIAGNOSIS — E876 Hypokalemia: Secondary | ICD-10-CM | POA: Diagnosis present

## 2022-04-20 DIAGNOSIS — K254 Chronic or unspecified gastric ulcer with hemorrhage: Secondary | ICD-10-CM | POA: Diagnosis present

## 2022-04-20 DIAGNOSIS — K2951 Unspecified chronic gastritis with bleeding: Secondary | ICD-10-CM | POA: Diagnosis present

## 2022-04-20 DIAGNOSIS — G4730 Sleep apnea, unspecified: Secondary | ICD-10-CM | POA: Diagnosis present

## 2022-04-20 DIAGNOSIS — E782 Mixed hyperlipidemia: Secondary | ICD-10-CM | POA: Diagnosis present

## 2022-04-20 DIAGNOSIS — R7989 Other specified abnormal findings of blood chemistry: Secondary | ICD-10-CM | POA: Diagnosis not present

## 2022-04-20 DIAGNOSIS — R197 Diarrhea, unspecified: Secondary | ICD-10-CM | POA: Diagnosis not present

## 2022-04-20 DIAGNOSIS — R6520 Severe sepsis without septic shock: Secondary | ICD-10-CM

## 2022-04-20 DIAGNOSIS — D62 Acute posthemorrhagic anemia: Secondary | ICD-10-CM | POA: Diagnosis not present

## 2022-04-20 DIAGNOSIS — K5731 Diverticulosis of large intestine without perforation or abscess with bleeding: Secondary | ICD-10-CM | POA: Diagnosis present

## 2022-04-20 DIAGNOSIS — K259 Gastric ulcer, unspecified as acute or chronic, without hemorrhage or perforation: Secondary | ICD-10-CM | POA: Diagnosis not present

## 2022-04-20 DIAGNOSIS — N179 Acute kidney failure, unspecified: Secondary | ICD-10-CM | POA: Diagnosis present

## 2022-04-20 DIAGNOSIS — K649 Unspecified hemorrhoids: Secondary | ICD-10-CM | POA: Diagnosis not present

## 2022-04-20 DIAGNOSIS — E1165 Type 2 diabetes mellitus with hyperglycemia: Secondary | ICD-10-CM

## 2022-04-20 DIAGNOSIS — N390 Urinary tract infection, site not specified: Secondary | ICD-10-CM | POA: Diagnosis present

## 2022-04-20 DIAGNOSIS — K2981 Duodenitis with bleeding: Secondary | ICD-10-CM | POA: Diagnosis present

## 2022-04-20 DIAGNOSIS — R55 Syncope and collapse: Secondary | ICD-10-CM | POA: Diagnosis present

## 2022-04-20 DIAGNOSIS — K7460 Unspecified cirrhosis of liver: Secondary | ICD-10-CM | POA: Diagnosis present

## 2022-04-20 DIAGNOSIS — K264 Chronic or unspecified duodenal ulcer with hemorrhage: Secondary | ICD-10-CM | POA: Diagnosis present

## 2022-04-20 DIAGNOSIS — A419 Sepsis, unspecified organism: Secondary | ICD-10-CM | POA: Diagnosis present

## 2022-04-20 DIAGNOSIS — K5730 Diverticulosis of large intestine without perforation or abscess without bleeding: Secondary | ICD-10-CM | POA: Diagnosis not present

## 2022-04-20 DIAGNOSIS — I10 Essential (primary) hypertension: Secondary | ICD-10-CM | POA: Diagnosis present

## 2022-04-20 DIAGNOSIS — Z79899 Other long term (current) drug therapy: Secondary | ICD-10-CM | POA: Diagnosis not present

## 2022-04-20 DIAGNOSIS — E669 Obesity, unspecified: Secondary | ICD-10-CM | POA: Diagnosis present

## 2022-04-20 DIAGNOSIS — K2970 Gastritis, unspecified, without bleeding: Secondary | ICD-10-CM | POA: Diagnosis not present

## 2022-04-20 DIAGNOSIS — K449 Diaphragmatic hernia without obstruction or gangrene: Secondary | ICD-10-CM | POA: Diagnosis present

## 2022-04-20 DIAGNOSIS — D500 Iron deficiency anemia secondary to blood loss (chronic): Secondary | ICD-10-CM | POA: Diagnosis not present

## 2022-04-20 DIAGNOSIS — Z794 Long term (current) use of insulin: Secondary | ICD-10-CM | POA: Diagnosis not present

## 2022-04-20 DIAGNOSIS — G35 Multiple sclerosis: Secondary | ICD-10-CM | POA: Diagnosis present

## 2022-04-20 DIAGNOSIS — Z1152 Encounter for screening for COVID-19: Secondary | ICD-10-CM | POA: Diagnosis not present

## 2022-04-20 LAB — COMPREHENSIVE METABOLIC PANEL
ALT: 17 U/L (ref 0–44)
AST: 25 U/L (ref 15–41)
Albumin: 2.4 g/dL — ABNORMAL LOW (ref 3.5–5.0)
Alkaline Phosphatase: 98 U/L (ref 38–126)
Anion gap: 6 (ref 5–15)
BUN: 23 mg/dL — ABNORMAL HIGH (ref 6–20)
CO2: 21 mmol/L — ABNORMAL LOW (ref 22–32)
Calcium: 8 mg/dL — ABNORMAL LOW (ref 8.9–10.3)
Chloride: 107 mmol/L (ref 98–111)
Creatinine, Ser: 1.12 mg/dL — ABNORMAL HIGH (ref 0.44–1.00)
GFR, Estimated: 58 mL/min — ABNORMAL LOW (ref >60)
Glucose, Bld: 184 mg/dL — ABNORMAL HIGH (ref 70–99)
Potassium: 4.4 mmol/L (ref 3.5–5.1)
Sodium: 134 mmol/L — ABNORMAL LOW (ref 135–145)
Total Bilirubin: 0.3 mg/dL (ref 0.3–1.2)
Total Protein: 6.4 g/dL — ABNORMAL LOW (ref 6.5–8.1)

## 2022-04-20 LAB — CBC WITH DIFFERENTIAL/PLATELET
Abs Immature Granulocytes: 0.03 10*3/uL (ref 0.00–0.07)
Basophils Absolute: 0 10*3/uL (ref 0.0–0.1)
Basophils Relative: 0 %
Eosinophils Absolute: 0.1 10*3/uL (ref 0.0–0.5)
Eosinophils Relative: 1 %
HCT: 25 % — ABNORMAL LOW (ref 36.0–46.0)
Hemoglobin: 7.3 g/dL — ABNORMAL LOW (ref 12.0–15.0)
Immature Granulocytes: 0 %
Lymphocytes Relative: 9 %
Lymphs Abs: 0.7 10*3/uL (ref 0.7–4.0)
MCH: 22.6 pg — ABNORMAL LOW (ref 26.0–34.0)
MCHC: 29.2 g/dL — ABNORMAL LOW (ref 30.0–36.0)
MCV: 77.4 fL — ABNORMAL LOW (ref 80.0–100.0)
Monocytes Absolute: 0.5 10*3/uL (ref 0.1–1.0)
Monocytes Relative: 6 %
Neutro Abs: 6.7 10*3/uL (ref 1.7–7.7)
Neutrophils Relative %: 84 %
Platelets: 168 10*3/uL (ref 150–400)
RBC: 3.23 MIL/uL — ABNORMAL LOW (ref 3.87–5.11)
RDW: 15.7 % — ABNORMAL HIGH (ref 11.5–15.5)
WBC: 8 10*3/uL (ref 4.0–10.5)
nRBC: 0 % (ref 0.0–0.2)

## 2022-04-20 LAB — PROTIME-INR
INR: 1.1 (ref 0.8–1.2)
Prothrombin Time: 14 seconds (ref 11.4–15.2)

## 2022-04-20 LAB — URINALYSIS, ROUTINE W REFLEX MICROSCOPIC
Bilirubin Urine: NEGATIVE
Glucose, UA: NEGATIVE mg/dL
Ketones, ur: NEGATIVE mg/dL
Nitrite: POSITIVE — AB
Protein, ur: 30 mg/dL — AB
Specific Gravity, Urine: 1.015 (ref 1.005–1.030)
pH: 5 (ref 5.0–8.0)

## 2022-04-20 LAB — CORTISOL-AM, BLOOD: Cortisol - AM: 7.8 ug/dL (ref 6.7–22.6)

## 2022-04-20 LAB — TROPONIN I (HIGH SENSITIVITY)
Troponin I (High Sensitivity): 32 ng/L — ABNORMAL HIGH (ref <18)
Troponin I (High Sensitivity): 38 ng/L — ABNORMAL HIGH (ref <18)

## 2022-04-20 LAB — LACTIC ACID, PLASMA
Lactic Acid, Venous: 3.2 mmol/L (ref 0.5–1.9)
Lactic Acid, Venous: 1.3 mmol/L (ref 0.5–1.9)

## 2022-04-20 LAB — CBG MONITORING, ED
Glucose-Capillary: 152 mg/dL — ABNORMAL HIGH (ref 70–99)
Glucose-Capillary: 162 mg/dL — ABNORMAL HIGH (ref 70–99)

## 2022-04-20 LAB — GLUCOSE, CAPILLARY: Glucose-Capillary: 107 mg/dL — ABNORMAL HIGH (ref 70–99)

## 2022-04-20 LAB — MAGNESIUM: Magnesium: 1.9 mg/dL (ref 1.7–2.4)

## 2022-04-20 LAB — PROCALCITONIN: Procalcitonin: 44.77 ng/mL

## 2022-04-20 MED ORDER — POTASSIUM CHLORIDE 10 MEQ/100ML IV SOLN
10.0000 meq | INTRAVENOUS | Status: AC
  Administered 2022-04-20 (×2): 10 meq via INTRAVENOUS
  Filled 2022-04-20 (×2): qty 100

## 2022-04-20 MED ORDER — GABAPENTIN 400 MG PO CAPS
800.0000 mg | ORAL_CAPSULE | Freq: Four times a day (QID) | ORAL | Status: DC
  Administered 2022-04-20 – 2022-04-25 (×21): 800 mg via ORAL
  Filled 2022-04-20 (×21): qty 2

## 2022-04-20 MED ORDER — INSULIN ASPART 100 UNIT/ML IJ SOLN
0.0000 [IU] | Freq: Every day | INTRAMUSCULAR | Status: DC
  Administered 2022-04-21: 2 [IU] via SUBCUTANEOUS
  Administered 2022-04-23: 4 [IU] via SUBCUTANEOUS

## 2022-04-20 MED ORDER — METOPROLOL SUCCINATE ER 50 MG PO TB24
50.0000 mg | ORAL_TABLET | Freq: Every day | ORAL | Status: DC
  Administered 2022-04-20 – 2022-04-25 (×6): 50 mg via ORAL
  Filled 2022-04-20 (×6): qty 1

## 2022-04-20 MED ORDER — ONDANSETRON HCL 4 MG/2ML IJ SOLN
4.0000 mg | Freq: Four times a day (QID) | INTRAMUSCULAR | Status: DC | PRN
  Administered 2022-04-22 – 2022-04-23 (×2): 4 mg via INTRAVENOUS
  Filled 2022-04-20 (×2): qty 2

## 2022-04-20 MED ORDER — ACETAMINOPHEN 650 MG RE SUPP: 650.0000 mg | Freq: Four times a day (QID) | RECTAL | Status: DC | PRN

## 2022-04-20 MED ORDER — INSULIN ASPART 100 UNIT/ML IJ SOLN
0.0000 [IU] | Freq: Three times a day (TID) | INTRAMUSCULAR | Status: DC
  Administered 2022-04-20: 2 [IU] via SUBCUTANEOUS
  Administered 2022-04-20: 3 [IU] via SUBCUTANEOUS
  Administered 2022-04-21: 2 [IU] via SUBCUTANEOUS
  Administered 2022-04-22: 5 [IU] via SUBCUTANEOUS
  Administered 2022-04-22: 2 [IU] via SUBCUTANEOUS
  Administered 2022-04-22 – 2022-04-23 (×2): 3 [IU] via SUBCUTANEOUS
  Administered 2022-04-23 – 2022-04-24 (×2): 2 [IU] via SUBCUTANEOUS
  Filled 2022-04-20: qty 1

## 2022-04-20 MED ORDER — PRAVASTATIN SODIUM 40 MG PO TABS
40.0000 mg | ORAL_TABLET | Freq: Every evening | ORAL | Status: DC
  Administered 2022-04-20 – 2022-04-24 (×5): 40 mg via ORAL
  Filled 2022-04-20 (×5): qty 1

## 2022-04-20 MED ORDER — SODIUM CHLORIDE 0.9 % IV SOLN
2.0000 g | INTRAVENOUS | Status: AC
  Administered 2022-04-20 – 2022-04-24 (×5): 2 g via INTRAVENOUS
  Filled 2022-04-20 (×5): qty 20

## 2022-04-20 MED ORDER — BACLOFEN 10 MG PO TABS
20.0000 mg | ORAL_TABLET | Freq: Three times a day (TID) | ORAL | Status: DC
  Administered 2022-04-20 – 2022-04-25 (×15): 20 mg via ORAL
  Filled 2022-04-20 (×15): qty 2

## 2022-04-20 MED ORDER — ONDANSETRON HCL 4 MG PO TABS
4.0000 mg | ORAL_TABLET | Freq: Four times a day (QID) | ORAL | Status: DC | PRN
  Administered 2022-04-24: 4 mg via ORAL
  Filled 2022-04-20: qty 1

## 2022-04-20 MED ORDER — ACETAMINOPHEN 325 MG PO TABS: 650.0000 mg | ORAL_TABLET | Freq: Four times a day (QID) | ORAL | Status: DC | PRN

## 2022-04-20 MED ORDER — ROPINIROLE HCL 0.25 MG PO TABS
0.5000 mg | ORAL_TABLET | Freq: Every day | ORAL | Status: DC
  Administered 2022-04-20 – 2022-04-24 (×5): 0.5 mg via ORAL
  Filled 2022-04-20 (×5): qty 2

## 2022-04-20 MED ORDER — CITALOPRAM HYDROBROMIDE 20 MG PO TABS
40.0000 mg | ORAL_TABLET | Freq: Every evening | ORAL | Status: DC
  Administered 2022-04-20 – 2022-04-24 (×5): 40 mg via ORAL
  Filled 2022-04-20 (×5): qty 2

## 2022-04-20 MED ORDER — OXYCODONE HCL 5 MG PO TABS
5.0000 mg | ORAL_TABLET | ORAL | Status: DC | PRN
  Administered 2022-04-20 – 2022-04-25 (×20): 5 mg via ORAL
  Filled 2022-04-20 (×21): qty 1

## 2022-04-20 MED ORDER — HEPARIN SODIUM (PORCINE) 5000 UNIT/ML IJ SOLN
5000.0000 [IU] | Freq: Three times a day (TID) | INTRAMUSCULAR | Status: DC
  Administered 2022-04-20 – 2022-04-21 (×3): 5000 [IU] via SUBCUTANEOUS
  Filled 2022-04-20 (×3): qty 1

## 2022-04-20 MED ORDER — CLOPIDOGREL BISULFATE 75 MG PO TABS
75.0000 mg | ORAL_TABLET | Freq: Every day | ORAL | Status: DC
  Administered 2022-04-20 – 2022-04-21 (×2): 75 mg via ORAL
  Filled 2022-04-20 (×2): qty 1

## 2022-04-20 NOTE — ED Notes (Signed)
Pure wick canister full. Replaced and new one hooked back up. Nurse notified.

## 2022-04-20 NOTE — Assessment & Plan Note (Addendum)
-  Continue holding lisinopril in the setting of hypotension and acute kidney injury viable. - Continue metoprolol and follow-up vital signs. -Maintain adequate hydration. -Heart healthy diet discussed with patient.

## 2022-04-20 NOTE — Progress Notes (Incomplete)
Around 1545 telemetry called the blue phone and  stated patients heart rate went up into the 200's and was reading ventricular fibrillation, charge nurse went into patients room, patient was coming from bathroom, telemetry asked this nurse if the patient was ok at 1559, this nurse stated to telemetry that I was unaware of what happened, this nurse walked into patients room, patient was resting in bed with eyes closed breathing even and nonlabored. Obtained vital signs, and paged MD. No new orders at this time

## 2022-04-20 NOTE — Assessment & Plan Note (Addendum)
-  Initial troponin normal, repeat troponin elevated at 32 - Most likely associated with acute kidney injury -Patient reports no chest pain or shortness of breath -So far telemetry without acute ischemic changes, no abnormalities appreciated on EKG. -Continue monitoring.

## 2022-04-20 NOTE — Assessment & Plan Note (Signed)
-  Continue statin

## 2022-04-20 NOTE — Assessment & Plan Note (Addendum)
-  Creatinine up to 1.51 from 0.74 - Secondary to sepsis pathology and UTI - Fluid resuscitation has been provided; patient advised to maintain adequate oral hydration -Continue treatment for UTI with antibiotics -Minimize nephrotoxic agents -Follow renal function trend.

## 2022-04-20 NOTE — H&P (Signed)
History and Physical    Patient: [name] BUL:[id number] DOB: Nov 24, 1965 DOA: 04/19/2022 DOS: the patient was seen and examined on 04/20/2022 PCP: Elfredia Nevins, MD  Patient coming from: Home  Chief Complaint:  Chief Complaint  Patient presents with   Near Syncope    Near syncopal episode in the bathroom this evening.    HPI: [name] is a 57 y.o. female with medical history significant of diabetes mellitus type 2, fatty liver, GERD, hyperlipidemia, hypertension, recent admission at the end of last year for sepsis secondary to UTI, and more presents the ED with a chief complaint of near syncope.  Patient appears older than stated age.  She is also poor historian.  Patient reports that she had a near syncopal event today after having a bowel movement.  She became diaphoretic felt generally unwell, and thought she would pass out.  She reports it was a normal bowel movement and not diarrhea.  It was nonbloody.  Upon further questioning she reports that she has had generalized weakness for a few days.  She has had fatigue as well.  She has felt feverish but has not had a measured temperature.  She denies dysuria, she denies diarrhea prior to her arrival here.  She has had diarrhea several times in the ED.  Patient reports no nausea or vomiting.  She does have a decreased appetite and reports that that is been going on for couple days.  She reports that she has a mild cough and minimal dyspnea, but those are both attributable to her asthma and they are no worse than normal.  Patient denies any abdominal pain.  She has not had any recent antibiotics since her last hospitalization.  Patient has no other complaints at this time.  Patient does not smoke.  She drinks on special occasions.  She does not use illicit drugs.  She is vaccinated for COVID.  Patient is full code. Review of Systems: As mentioned in the history of present illness. All other systems reviewed and are negative. Past Medical  History:  Diagnosis Date   Asthma    Chronic abdominal pain    Chronic neck pain    C4-6 fusion   CONSTIPATION 05/16/2009   Qualifier: Diagnosis of  By: Yetta Barre FNP-BC, Kandice L    Diabetes mellitus without complication (HCC)    Dysphagia 02/23/2012   Fatty liver    GERD (gastroesophageal reflux disease)    Helicobacter pylori gastritis 2011   Hyperlipidemia    Hypertension    LIVER FUNCTION TESTS, ABNORMAL, HX OF 05/14/2009   Qualifier: Diagnosis of  By: Levi Aland, [place]     Migraine headache    MIGRAINE, COMMON 05/14/2009   Qualifier: Diagnosis of  By: Ricard Dillon     MS (multiple sclerosis) [facility])    Sleep apnea    Past Surgical History:  Procedure Laterality Date   ABDOMINAL HYSTERECTOMY     APPENDECTOMY     BACK SURGERY  03/07/2012   Nerve Stimulator   CHOLECYSTECTOMY     COLONOSCOPY  07/09/2009   RMR; normal rectum aside from anal canal hemorrhoids/scattered left-sided diverticula   ESOPHAGOGASTRODUODENOSCOPY  05/22/2009   RMR; normal /small HH   ESOPHAGOGASTRODUODENOSCOPY  2007   RMR: non-critical Schatzki's rin, non-maniuplated   ESOPHAGOGASTRODUODENOSCOPY N/A 10/24/2013   Procedure: ESOPHAGOGASTRODUODENOSCOPY (EGD);  Surgeon: Corbin Ade, MD;  [facility];  Service: Endoscopy;  Laterality: N/A;  9:45   ESOPHAGOGASTRODUODENOSCOPY (EGD) WITH ESOPHAGEAL DILATION  03/17/2012   [id number]  appearing esophageal mucosa of uncertain significance. Status post Venia Minks dilation followed by esophageal bx   MALONEY DILATION N/A 10/24/2013   Procedure: Venia Minks DILATION;  Surgeon: Daneil Dolin, MD;  Location: [facility];  Service: Endoscopy;  Laterality: N/A;   MANDIBLE SURGERY     NECK SURGERY     X2, last time 2013   Social History:  reports that she has never smoked. She has never used smokeless tobacco. She reports that she does not currently use alcohol. She reports that she does not use drugs.  Allergies  Allergen Reactions   Peanut-Containing Drug  Products Anaphylaxis and Hives    Patient states she is allergic to all nuts   Shellfish Allergy Anaphylaxis   Gluten Meal    Latex Other (See Comments)    Reaction: blistering   Prednisone Hives and Other (See Comments)    Reaction: causes psychological issues    Nexium [Esomeprazole Magnesium] Hives   Tape Rash    Paper tape please    Family History  Problem Relation Age of Onset   Cancer Mother        unknown type   Diabetes Father    Diabetes Sister    Colon cancer Neg Hx     Prior to Admission medications   Medication Sig Start Date End Date Taking? Authorizing Provider  ACCU-CHEK GUIDE test strip USE TO CHECK BLOOD SUGAR 4 TIMES DAILY AS DIRECTED 03/06/22   Nida, Marella Chimes, MD  Accu-Chek Softclix Lancets lancets USE TO CHECK BLOOD SUGAR 4 TIMES DAILY AS DIRECTED 03/06/22   Nida, Marella Chimes, MD  albuterol (ACCUNEB) 0.63 MG/3ML nebulizer solution Take 1 ampule by nebulization every 6 (six) hours as needed for wheezing or shortness of breath. 11/24/21   [provider]  albuterol (VENTOLIN HFA) 108 (90 Base) MCG/ACT inhaler Inhale 1-2 puffs into the lungs every 6 (six) hours as needed for wheezing or shortness of breath.  01/02/19   [provider]  baclofen (LIORESAL) 20 MG tablet Take 20 mg by mouth 3 (three) times daily.  05/08/14   [provider]  blood glucose meter kit and supplies KIT Dispense based on patient and insurance preference. Use up to four times daily as directed. (FOR ICD-9 250.00, 250.01). 03/15/19   Geradine Girt, DO  Blood Glucose Monitoring Suppl (ACCU-CHEK GUIDE ME) w/Device KIT USE TO CHECK BLOOD SUGAR DAILY AS DIRECTED 12/31/21   Nida, Marella Chimes, MD  citalopram (CELEXA) 40 MG tablet Take 40 mg by mouth every evening.     [provider]  clopidogrel (PLAVIX) 75 MG tablet Take 75 mg by mouth daily. 11/27/21   [provider]  Continuous Blood Gluc Receiver (DEXCOM G7 RECEIVER) DEVI Use to check  glucose as directed 10/28/21   Cassandria Anger, MD  Continuous Blood Gluc Sensor (DEXCOM G7 SENSOR) MISC Change sensor every 10 days 01/19/22   Cassandria Anger, MD  gabapentin (NEURONTIN) 800 MG tablet Take 800 mg by mouth 4 (four) times daily. 01/02/19   [provider]  insulin isophane & regular human KwikPen (NOVOLIN 70/30 KWIKPEN) (70-30) 100 UNIT/ML KwikPen Inject 10 Units into the skin 2 (two) times daily before a meal. 12/02/21   Kathie Dike, MD  Insulin Pen Needle (B-D ULTRAFINE III SHORT PEN) 31G X 8 MM MISC USE TO INJECT INSULIN 2 TIMES DAILY 07/16/21   Nida, Marella Chimes, MD  lisinopril (ZESTRIL) 5 MG tablet TAKE ONE TABLET BY MOUTH ONCE DAILY. 03/31/22  Roma Kayser, MD  metFORMIN (GLUCOPHAGE) 500 MG tablet Take 2 tablets (1,000 mg total) by mouth 2 (two) times daily. 10/15/21   Roma Kayser, MD  metoprolol succinate (TOPROL-XL) 50 MG 24 hr tablet Take 1 tablet (50 mg total) by mouth daily. Take with or immediately following a meal. 04/09/22 04/04/23  Hammock, Lavonna Rua, NP  ondansetron (ZOFRAN) 4 MG tablet Take 4 mg by mouth 4 (four) times daily as needed for nausea or vomiting. 03/06/19   [provider]  oxycodone (ROXICODONE) 30 MG immediate release tablet Take 15 mg by mouth every 6 (six) hours as needed for pain. 10/31/21   [provider]  pravastatin (PRAVACHOL) 40 MG tablet Take 1 tablet (40 mg total) by mouth every evening. 03/15/19   Black, Lesle Chris, NP  rOPINIRole (REQUIP) 0.5 MG tablet Take 0.5 mg by mouth at bedtime. 11/27/21   [provider]    Physical Exam: Vitals:   04/20/22 0130 04/20/22 0200 04/20/22 0230 04/20/22 0300  BP: 134/86 (!) 121/58 128/65 115/65  Pulse: (!) 108 (!) 123 (!) 109 (!) 107  Resp:   20 12  Temp:      TempSrc:      SpO2: 95% 100% 99% 98%  Weight:      Height:       1.  General: Patient lying supine in bed, chronically ill-appearing and appearing older than stated age   2.  Psychiatric: Alert and oriented x 3, mood and behavior normal for situation, pleasant and cooperative with exam   3. Neurologic: Speech and language are normal, face is symmetric, moves all 4 extremities voluntarily, at baseline without acute deficits on limited exam   4. HEENMT:  Head is atraumatic, normocephalic, pupils reactive to light, neck is supple, trachea is midline, mucous membranes are moist   5. Respiratory : Lungs are clear to auscultation bilaterally without wheezing, rhonchi, rales, no cyanosis, no increase in work of breathing or accessory muscle use   6. Cardiovascular : Heart rate tachycardic, rhythm is regular, no murmurs, rubs or gallops, no peripheral edema, peripheral pulses palpated   7. Gastrointestinal:  Abdomen is soft, nondistended, nontender to palpation bowel sounds active, no masses or organomegaly palpated   8. Skin:  Skin is warm, dry and intact without rashes, acute lesions, or ulcers on limited exam   9.Musculoskeletal:  No acute deformities or trauma, no asymmetry in tone, no peripheral edema, peripheral pulses palpated, no tenderness to palpation in the extremities  Data Reviewed: In the ED Temp 95.3-97.1, heart rate 96-105, respiratory rate 10-15, blood pressure 88/44-148/78, satting 93-98% Leukocytosis 15.2, hemoglobin 9.2, platelets 282 Chemistry reveals a hypokalemia at 3.4 AKI with a creatinine 1.51 Albumin is mildly low at 3.0 Troponin initially 12 and then 32 Negative COVID flu and RSV UA is positive nitrites and has 21-50 white blood cells indicative of UTI Blood culture and urine culture pending Patient was started on vancomycin, cefepime, and Flagyl 2-1/2 L bolus Admission requested for further management of sepsis secondary to UTI versus GI illness  Assessment and Plan: * Sepsis (HCC) - Tachycardic, hypotensive, with leukocytosis - Lactic acidosis and AKI - Elevated troponin as well - UTI could be the source again, like her  last hospitalization, but she is also having diarrhea so GI pathogens are also on the differential - Colitis was not ruled out on the CT scan - Patient was started on vancomycin, cefepime, Flagyl - We will continue on Rocephin with the UTI being #1  differential - Stool study pending - Patient received 2.5 L bolus in the ED - Continue LR at 150 mL/h - Blood cultures pending - Urine culture pending - Continue to monitor  Elevated troponin - Initial troponin normal, repeat troponin elevated at 32 - Cycle another troponin - Thought to be a troponin leak in the setting of sepsis and improvement with treatment of sepsis pathology  AKI (acute kidney injury) ([place]) - Creatinine up to 1.51 from 0.74 - Secondary to sepsis pathology - Fluid bolus given in the ED - Recheck in the a.m.  Hypokalemia - Potassium down to 3.4 - Replace and recheck  Uncontrolled type 2 diabetes mellitus with hyperglycemia ([place]) - Patient takes 10 units of 7030 at home - Continue sliding scale coverage - Monitor CBGs  Mixed hyperlipidemia - Continue statin  HTN (hypertension) - Holding lisinopril in the setting of hypotension upon arrival - Continue metoprolol as patient has been tachycardic despite improvement in blood pressure      Advance Care Planning:   Code Status: Full Code  Consults: None at this time  Family Communication: No family at bedside  Severity of Illness: The appropriate patient status for this patient is INPATIENT. Inpatient status is judged to be reasonable and necessary in order to provide the required intensity of service to ensure the patient's safety. The patient's presenting symptoms, physical exam findings, and initial radiographic and laboratory data in the context of their chronic comorbidities is felt to place them at high risk for further clinical deterioration. Furthermore, it is not anticipated that the patient will be medically stable for discharge from the hospital  within 2 midnights of admission.   * I certify that at the point of admission it is my clinical judgment that the patient will require inpatient hospital care spanning beyond 2 midnights from the point of admission due to high intensity of service, high risk for further deterioration and high frequency of surveillance required.*  Author: Rolla Plate, DO 04/20/2022 4:10 AM  For on call review [email or web address].

## 2022-04-20 NOTE — Assessment & Plan Note (Addendum)
-  Continue sliding scale insulin -Follow CBGs fluctuation and adjust hypoglycemic regimen as required. -Modified carbohydrate diet discussed with patient.

## 2022-04-20 NOTE — Assessment & Plan Note (Addendum)
-  Patient met sepsis criteria at time of admission with tachycardia, hypotension and leukocytosis. - Patient's lactic acid was also elevated and demonstrated acute kidney injury. -Source of infection UTI. -CT scan demonstrating also possible component of colitis.   -Continue current IV antibiotics -Follow culture results -Maintain adequate hydration -Follow renal function/electrolytes.

## 2022-04-20 NOTE — Progress Notes (Signed)
Patient seen and examined; admitted after midnight secondary to generalized weakness and near syncope event with workup suggesting sepsis most likely due to UTI.  Currently hemodynamically stable and in no acute distress.  Still reporting decreased appetite.  No chest pain, no shortness of breath and no abdominal pain.  Initially hypothermic, but temperature has now normalized.  Please refer to H&P written by Dr. Clearence Ped on 04/20/22 for further info/details on admission.  Plan: -Continue aggressive fluid resuscitation -Continue IV antibiotics -Follow culture results -Continue antiemetics and supportive care -Follow clinical response.  Barton Dubois MD [phone]

## 2022-04-20 NOTE — ED Notes (Signed)
Bair hugger removed from pt 

## 2022-04-20 NOTE — Assessment & Plan Note (Addendum)
-  Continue to follow electrolytes and further replete as needed. 

## 2022-04-21 DIAGNOSIS — E1165 Type 2 diabetes mellitus with hyperglycemia: Secondary | ICD-10-CM | POA: Diagnosis not present

## 2022-04-21 DIAGNOSIS — D62 Acute posthemorrhagic anemia: Secondary | ICD-10-CM

## 2022-04-21 DIAGNOSIS — N390 Urinary tract infection, site not specified: Secondary | ICD-10-CM

## 2022-04-21 DIAGNOSIS — E782 Mixed hyperlipidemia: Secondary | ICD-10-CM | POA: Diagnosis not present

## 2022-04-21 DIAGNOSIS — A419 Sepsis, unspecified organism: Secondary | ICD-10-CM | POA: Diagnosis not present

## 2022-04-21 LAB — CBC
HCT: 24.3 % — ABNORMAL LOW (ref 36.0–46.0)
Hemoglobin: 7.2 g/dL — ABNORMAL LOW (ref 12.0–15.0)
MCH: 22.7 pg — ABNORMAL LOW (ref 26.0–34.0)
MCHC: 29.6 g/dL — ABNORMAL LOW (ref 30.0–36.0)
MCV: 76.7 fL — ABNORMAL LOW (ref 80.0–100.0)
Platelets: 160 10*3/uL (ref 150–400)
RBC: 3.17 MIL/uL — ABNORMAL LOW (ref 3.87–5.11)
RDW: 15.6 % — ABNORMAL HIGH (ref 11.5–15.5)
WBC: 5.7 10*3/uL (ref 4.0–10.5)
nRBC: 0 % (ref 0.0–0.2)

## 2022-04-21 LAB — BASIC METABOLIC PANEL
Anion gap: 6 (ref 5–15)
BUN: 14 mg/dL (ref 6–20)
CO2: 24 mmol/L (ref 22–32)
Calcium: 8 mg/dL — ABNORMAL LOW (ref 8.9–10.3)
Chloride: 108 mmol/L (ref 98–111)
Creatinine, Ser: 0.67 mg/dL (ref 0.44–1.00)
GFR, Estimated: >60 mL/min (ref >60)
Glucose, Bld: 118 mg/dL — ABNORMAL HIGH (ref 70–99)
Potassium: 4 mmol/L (ref 3.5–5.1)
Sodium: 138 mmol/L (ref 135–145)

## 2022-04-21 LAB — OCCULT BLOOD X 1 CARD TO LAB, STOOL: Fecal Occult Bld: POSITIVE — AB

## 2022-04-21 LAB — GLUCOSE, CAPILLARY
Glucose-Capillary: 293 mg/dL — ABNORMAL HIGH (ref 70–99)
Glucose-Capillary: 121 mg/dL — ABNORMAL HIGH (ref 70–99)
Glucose-Capillary: 211 mg/dL — ABNORMAL HIGH (ref 70–99)

## 2022-04-21 LAB — RETIC PANEL
Immature Retic Fract: 21 % — ABNORMAL HIGH (ref 2.3–15.9)
RBC.: 3.1 MIL/uL — ABNORMAL LOW (ref 3.87–5.11)
Retic Count, Absolute: 53.3 10*3/uL (ref 19.0–186.0)
Retic Ct Pct: 1.7 % (ref 0.4–3.1)
Reticulocyte Hemoglobin: 22.1 pg — ABNORMAL LOW (ref >27.9)

## 2022-04-21 LAB — URINE CULTURE: Culture: NO GROWTH

## 2022-04-21 MED ORDER — PANTOPRAZOLE SODIUM 40 MG PO TBEC
40.0000 mg | DELAYED_RELEASE_TABLET | Freq: Two times a day (BID) | ORAL | Status: DC
  Administered 2022-04-21 – 2022-04-23 (×5): 40 mg via ORAL
  Filled 2022-04-21 (×5): qty 1

## 2022-04-21 NOTE — Progress Notes (Signed)
Progress Note   Patient: [name] DOB: Dec 02, 1965 DOA: 04/19/2022     1 DOS: the patient was seen and examined on 04/21/2022   Brief hospital course: As per H&P written by Dr. Clearence Ped on 04/20/22 [name] is a 57 y.o. female with medical history significant of diabetes mellitus type 2, fatty liver, GERD, hyperlipidemia, hypertension, recent admission at the end of last year for sepsis secondary to UTI, and more presents the ED with a chief complaint of near syncope.  Patient appears older than stated age.  She is also poor historian.  Patient reports that she had a near syncopal event today after having a bowel movement.  She became diaphoretic felt generally unwell, and thought she would pass out.  She reports it was a normal bowel movement and not diarrhea.  It was nonbloody.  Upon further questioning she reports that she has had generalized weakness for a few days.  She has had fatigue as well.  She has felt feverish but has not had a measured temperature.  She denies dysuria, she denies diarrhea prior to her arrival here.  She has had diarrhea several times in the ED.  Patient reports no nausea or vomiting.  She does have a decreased appetite and reports that that is been going on for couple days.  She reports that she has a mild cough and minimal dyspnea, but those are both attributable to her asthma and they are no worse than normal.  Patient denies any abdominal pain.  She has not had any recent antibiotics since her last hospitalization.  Patient has no other complaints at this time.   Patient does not smoke.  She drinks on special occasions.  She does not use illicit drugs.  She is vaccinated for COVID.  Patient is full code.  Assessment and Plan: * Sepsis ([place]) - Patient met sepsis criteria at time of admission with tachycardia, hypotension and leukocytosis. - Patient's lactic acid was also elevated and demonstrated acute kidney injury. -Source of infection UTI. -CT  scan demonstrating also possible component of colitis.   -Continue current IV antibiotics -Follow culture results -Maintain adequate hydration -Follow renal function/electrolytes.   Elevated troponin - Initial troponin normal, repeat troponin elevated at 32 - Most likely associated with acute kidney injury -Patient reports no chest pain or shortness of breath -So far telemetry without acute ischemic changes, no abnormalities appreciated on EKG. -Continue monitoring.  AKI (acute kidney injury) ([place]) - Creatinine up to 1.51 from 0.74 - Secondary to sepsis pathology and UTI - Fluid resuscitation has been provided; patient advised to maintain adequate oral hydration -Continue treatment for UTI with antibiotics -Minimize nephrotoxic agents -Follow renal function trend.  Hypokalemia - Continue to follow electrolytes and further replete as needed.  Uncontrolled type 2 diabetes mellitus with hyperglycemia (HCC) - Continue sliding scale insulin -Follow CBGs fluctuation and adjust hypoglycemic regimen as required. -Modified carbohydrate diet discussed with patient.  Mixed hyperlipidemia - Continue statin  HTN (hypertension) - Continue holding lisinopril in the setting of hypotension and acute kidney injury viable. - Continue metoprolol and follow-up vital signs. -Maintain adequate hydration. -Heart healthy diet discussed with patient.  Class I obesity -Body mass index is 30.79 kg/m. -low calorie diet and portion control discussed with patient.  Positive fecal occult blood test/acute blood loss anemia/presumed GI bleed -Follow hemoglobin trend; transfusion threshold hemoglobin less than 7 -PPI twice a day has been initiated -Heparin and Plavix have been discontinued -GI service consulted.  Subjective:  Afebrile; reports no chest pain, no nausea or vomiting.  Patient expressed having some loose stools and feeling weak.  Low hemoglobin on her morning results  appreciated.  Physical Exam: Vitals:   04/20/22 1334 04/20/22 1602 04/20/22 2141 04/21/22 0437  BP: 124/69 128/62 127/70 130/80  Pulse: 94 96 90 80  Resp: 16  20 20   Temp: 99.2 F (37.3 C)  99 F (37.2 C) 98.5 F (36.9 C)  TempSrc: Oral  Oral Oral  SpO2: 100% 98% 96% 97%  Weight:      Height:       General exam: Alert, awake, oriented x 3; no chest pain, no nausea, no vomiting.  Reports having some loose stools. Respiratory system: Clear to auscultation. Respiratory effort normal.  Good saturation on room air. Cardiovascular system:RRR. No rubs or gallops; no JVD. Gastrointestinal system: Abdomen is nondistended, soft and nontender. No organomegaly or masses felt. Normal bowel sounds heard. Central nervous system: Alert and oriented. No focal neurological deficits. Extremities: No cyanosis, clubbing or edema. Skin: No petechiae. Psychiatry: Judgement and insight appear normal. Mood & affect appropriate.   Data Reviewed: CBC: WBCs 5.7, hemoglobin 7.2, MCV 76.7, platelet count 160 K. Basic metabolic panel: Sodium 876, potassium 4.0, chloride 108, bicarb 24, BUN 14, creatinine 0.67 and GFR more than 60 Fecal occult blood test positive  Family Communication: No family at bedside.  Patient expressed that she will update her husband.  Disposition: Status is: Inpatient Remains inpatient appropriate because: Continue IV antibiotics, follow cultures results; investigate low hemoglobin level (holding heparin products, and Plavix; GI service consulted).   Planned Discharge Destination: Home   Time spent: 35 minutes  Author: Barton Dubois, MD 04/21/2022 5:39 PM  For on call review [email or web address].

## 2022-04-21 NOTE — Plan of Care (Signed)
  Problem: Fluid Volume: Goal: Hemodynamic stability will improve Outcome: Progressing   Problem: Education: Goal: Knowledge of General Education information will improve Description: Including pain rating scale, medication(s)/side effects and non-pharmacologic comfort measures Outcome: Progressing   

## 2022-04-21 NOTE — Discharge Instructions (Addendum)
1)Follow-up Gastroenterologist Dr. Gala Romney with [facility]---in 4 weeks for evaluation  to discuss Liver Cirrhosis and Repeat EGD/Endoscopy due to gastric ulcers.  -address: [address], [place], [place] [address], Phone: [phone]  2)Repeat  CBC and CMP Blood Tests in 1 week with Redmond School, MD   3)Avoid ibuprofen/Advil/Aleve/Motrin/Goody Powders/Naproxen/BC powders/Meloxicam/Diclofenac/Indomethacin and other Nonsteroidal anti-inflammatory medications as these will make you more likely to bleed and can cause stomach ulcers, can also cause Kidney problems.      Food  Agency Name: [employer]  Address: [address] [place], [place] [address]  Phone: [phone]  Website: [email or web address]  Email: [email or web address]  Services Offered: Food pantry, CPR Classes, disaster services (fire) April 29, 2015 6   Agency Name: [facility]  Address: [address] [place], [place] [address]  Phone: [phone]  Website: www.[email or web address]  Services Offered: Meals on [employer]. Home care, at home assisted living, [place] for [place], transportation   Agency Name: [employer]  Address: Sites vary. Must call first. Food Pantry location: [address], [place], [place] [address] [place]  Phone: [phone]  Website: none  Services Offered: Building services engineer, utility assistance if funds available Corporate treasurer for all of [place], [place], [place] for [facility] area only) Walk-in current Id and current address verification required. Wed-Thurs: 9:30-12:00   Agency Name: [facility]  Address: [address] [place], [place] [address]  Phone: [phone]  Website: none  Services Offered: Food assistance   Agency Name: [employer]  Address: [address], [place], [place] [address]   Phone: [phone]  Contact: Juliann Pulse  Website: none  Services Offered: Serve 1 hot meal a day at 11:00 am Monday-Sunday and also 4 pm on Sunday   Agency Name: [place] [facility] and [facility] Services  Address: [place], [place], [place] [address]  Phone: [phone]  Website: [email or web address]  [email or web address]  Services Offered: Food Stamps April 29, 2015 7   Agency Name: [facility]  Address: [address] [place],  [address]  Contact: Heloise Beecham Director  Phone: [phone]  Services Offered: Building surveyor, [employer], health clinic (8:30-11:30)   Agency Name: [employer]  Address: [address], [place] / [address] [place]  Phone: [phone] [place] / [phone] [place]  Website: [email or web address]  Services Offered: Games developer, food, showers, hygiene products utility payment assistance, thrift shops, rental assistance

## 2022-04-21 NOTE — TOC Initial Note (Signed)
Transition of Care [facility]) - Initial/Assessment Note    Patient Details  Name: [name] MRN: [id number] Date of Birth: [birth year]  Transition of Care [facility]) CM/SW Contact:    Ihor Gully, LCSW Phone Number: 04/21/2022, 1:30 PM  Clinical Narrative:                 Patient from home with spouse. Admitted for Sepsis. Has MS. TOC consulted for medication needs. TOC unable to provide assistance due to patient having insurance. Patient discussed difficulty affording some medications. Discussed having her PCP enroll her in prescription assistance programs. Patient agreeable. Discussed SDOH concern for food insecurity. Patient endorsed. Multiple resources placed on patient's d/c instructions for food. Patient is not employed due to her MS. Has not applied for disability. Encouraged patient to apply for disability with social security administration. Patient agreeable.   Expected Discharge Plan: Home/Self Care Barriers to Discharge: Continued Medical Work up   Patient Goals and CMS Choice Patient states their goals for this hospitalization and ongoing recovery are:: return home          Expected Discharge Plan and Services       Living arrangements for the past 2 months: Single Family Home                                      Prior Living Arrangements/Services Living arrangements for the past 2 months: Single Family Home Lives with:: Spouse Patient language and need for interpreter reviewed:: Yes        Need for Family Participation in Patient Care: Yes (Comment) Care giver support system in place?: Yes (comment)   Criminal Activity/Legal Involvement Pertinent to Current Situation/Hospitalization: No - Comment as needed  Activities of Daily Living Home Assistive Devices/Equipment: Eyeglasses, CBG Meter ADL Screening (condition at time of admission) Patient's cognitive ability adequate to safely complete daily activities?: Yes Is the patient deaf or have difficulty  hearing?: No Does the patient have difficulty seeing, even when wearing glasses/contacts?: No Does the patient have difficulty concentrating, remembering, or making decisions?: No Patient able to express need for assistance with ADLs?: Yes Does the patient have difficulty dressing or bathing?: No Independently performs ADLs?: Yes (appropriate for developmental age) Does the patient have difficulty walking or climbing stairs?: No Weakness of Legs: Both Weakness of Arms/Hands: None  Permission Sought/Granted                  Emotional Assessment     Affect (typically observed): Appropriate Orientation: : Oriented to Self, Oriented to Place, Oriented to  Time, Oriented to Situation Alcohol / Substance Use: Not Applicable    Admission diagnosis:  Syncope and collapse [R55] Sepsis ([place]) [A41.9] Sepsis, due to unspecified organism, unspecified whether acute organ dysfunction present [facility]) [A41.9] Patient Active Problem List   Diagnosis Date Noted   Sepsis ([place]) 04/20/2022   Elevated troponin 04/20/2022   Gram-negative bacteremia 11/29/2021   Severe sepsis ([place]) 11/28/2021   Hypokalemia 11/28/2021   AKI (acute kidney injury) ([place]) 11/28/2021   LFT elevation 11/28/2021   Subclinical hyperthyroidism 03/27/2020   Closed nondisplaced fracture of proximal phalanx of lesser toe of right foot 12/27/2019   Fatty liver 12/07/2019   DM type 2 causing vascular disease ([place]) 04/11/2019   Facial droop 03/12/2019   Vitamin D deficiency 08/26/2017   Uncontrolled type 2 diabetes mellitus with hyperglycemia ([place]) 03/15/2017   Essential hypertension, benign  03/15/2017   Class 2 severe obesity due to excess calories with serious comorbidity and body mass index (BMI) of 35.0 to 35.9 in adult [facility]) 03/15/2017   Spondylosis of thoracic region without myelopathy or radiculopathy 07/02/2014   Abdominal pain, epigastric 10/19/2013   Unspecified constipation 10/19/2013   Pupillary abnormality of  both eyes 03/09/2013   Chest pain 04/16/2012   Abnormal EKG 04/16/2012   HTN (hypertension) 04/16/2012   Mixed hyperlipidemia 04/16/2012   Multiple sclerosis ([place]) 04/16/2012   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 04/16/2012   Dysphagia 02/23/2012   Back pain, chronic 01/11/2012   Thoracic radiculopathy 01/11/2012   Occipital neuralgia 01/06/2012   Depression 05/07/2011   Insomnia 05/07/2011   Sleep apnea 05/07/2011   Cervical spondylosis 12/23/2010   FATTY LIVER DISEASE 50/53/9767   HELICOBACTER PYLORI GASTRITIS 05/14/2009   ASTHMA 05/14/2009   Esophageal reflux 05/14/2009   NAUSEA 05/14/2009   Diarrhea 05/14/2009   DEPRESSION, HX OF 05/14/2009   Non-adherence to medical treatment 05/14/2009   PCP:  Redmond School, MD Pharmacy:   [facility], [address] [place] [address] Phone: [phone] Fax: [phone]  [id number] [place], [place] - [address] [place] [address] Phone: [phone] Fax: [place], [address] [address] Phone: [phone] Fax: [phone]  [facility] [place] [place] to [address] [place] [address] Phone: [phone] Fax: [phone]     Social Determinants of Health (SDOH) Social History: [place]: Food Insecurity Present (04/20/2022)  Housing: Low Risk  (04/20/2022)  Transportation Needs: No Transportation Needs (04/20/2022)  Utilities: Not At Risk (04/20/2022)  Depression (PHQ2-9): Low Risk  (04/07/2018)  Tobacco Use: Low Risk  (04/20/2022)   SDOH Interventions: Food Insecurity Interventions: Other (Comment) (Multiple resources for food assistance listed in discharge  instructions. Patient aware.)   Readmission Risk Interventions     No data to display

## 2022-04-22 DIAGNOSIS — I10 Essential (primary) hypertension: Secondary | ICD-10-CM | POA: Diagnosis not present

## 2022-04-22 DIAGNOSIS — E876 Hypokalemia: Secondary | ICD-10-CM | POA: Diagnosis not present

## 2022-04-22 DIAGNOSIS — R197 Diarrhea, unspecified: Secondary | ICD-10-CM | POA: Diagnosis not present

## 2022-04-22 DIAGNOSIS — R7989 Other specified abnormal findings of blood chemistry: Secondary | ICD-10-CM | POA: Diagnosis not present

## 2022-04-22 DIAGNOSIS — D500 Iron deficiency anemia secondary to blood loss (chronic): Secondary | ICD-10-CM

## 2022-04-22 DIAGNOSIS — N179 Acute kidney failure, unspecified: Secondary | ICD-10-CM | POA: Diagnosis not present

## 2022-04-22 LAB — CBC
HCT: 24.7 % — ABNORMAL LOW (ref 36.0–46.0)
Hemoglobin: 7.3 g/dL — ABNORMAL LOW (ref 12.0–15.0)
MCH: 22.5 pg — ABNORMAL LOW (ref 26.0–34.0)
MCHC: 29.6 g/dL — ABNORMAL LOW (ref 30.0–36.0)
MCV: 76.2 fL — ABNORMAL LOW (ref 80.0–100.0)
Platelets: 163 10*3/uL (ref 150–400)
RBC: 3.24 MIL/uL — ABNORMAL LOW (ref 3.87–5.11)
RDW: 15.9 % — ABNORMAL HIGH (ref 11.5–15.5)
WBC: 4.3 10*3/uL (ref 4.0–10.5)
nRBC: 0 % (ref 0.0–0.2)

## 2022-04-22 LAB — IRON AND TIBC
Iron: 16 ug/dL — ABNORMAL LOW (ref 28–170)
Saturation Ratios: 4 % — ABNORMAL LOW (ref 10.4–31.8)
TIBC: 440 ug/dL (ref 250–450)
UIBC: 424 ug/dL

## 2022-04-22 LAB — MRSA NEXT GEN BY PCR, NASAL: MRSA by PCR Next Gen: NOT DETECTED

## 2022-04-22 LAB — GLUCOSE, CAPILLARY
Glucose-Capillary: 145 mg/dL — ABNORMAL HIGH (ref 70–99)
Glucose-Capillary: 185 mg/dL — ABNORMAL HIGH (ref 70–99)
Glucose-Capillary: 220 mg/dL — ABNORMAL HIGH (ref 70–99)
Glucose-Capillary: 154 mg/dL — ABNORMAL HIGH (ref 70–99)

## 2022-04-22 LAB — BASIC METABOLIC PANEL
Anion gap: 7 (ref 5–15)
BUN: 11 mg/dL (ref 6–20)
CO2: 26 mmol/L (ref 22–32)
Calcium: 7.9 mg/dL — ABNORMAL LOW (ref 8.9–10.3)
Chloride: 102 mmol/L (ref 98–111)
Creatinine, Ser: 0.68 mg/dL (ref 0.44–1.00)
GFR, Estimated: >60 mL/min (ref >60)
Glucose, Bld: 143 mg/dL — ABNORMAL HIGH (ref 70–99)
Potassium: 3.8 mmol/L (ref 3.5–5.1)
Sodium: 135 mmol/L (ref 135–145)

## 2022-04-22 LAB — FERRITIN: Ferritin: 5 ng/mL — ABNORMAL LOW (ref 11–307)

## 2022-04-22 MED ORDER — PEG 3350-KCL-NA BICARB-NACL 420 G PO SOLR
4000.0000 mL | Freq: Once | ORAL | Status: AC
  Administered 2022-04-22: 4000 mL via ORAL

## 2022-04-22 MED ORDER — METRONIDAZOLE 500 MG/100ML IV SOLN
500.0000 mg | Freq: Two times a day (BID) | INTRAVENOUS | Status: AC
  Administered 2022-04-22 – 2022-04-24 (×4): 500 mg via INTRAVENOUS
  Filled 2022-04-22 (×4): qty 100

## 2022-04-22 NOTE — Consult Note (Signed)
Gastroenterology Consult   Referring Provider: No ref. provider found Primary Care Physician:  Elfredia Nevins, MD Primary Gastroenterologist:  Previously Dr. Jena Gauss   Patient ID: [name]; [id number]; 1965/07/13   Admit date: 04/19/2022  LOS: 2 days   Date of Consultation: 04/22/2022 Reason for Consultation:  anemia   History of Present Illness   [name] is a 57 y.o. year old female with medical history of asthma, DM, GERD, HLD, HTN, migraines, MS, sleep apnea who presented to the ED on 1/21 for syncope, fatigue and generalized weakness. Also complained of lower back pain and dysuria  Hemoglobin on admission 9.2, down to 7.3 this morning. FOBT positive Ca 7.9  Plt count 163k, LFTs WNL, INR 1.1  CT C/A/P wo contrast 1/21 Cirrhosis with evidence of portal hypertension. Mild diffuse pericolonic stranding, likely related to hepatic. colopathy. Colitis is less likely but not excluded clinical correlation is recommended. No bowel obstruction. Sigmoid diverticulosis.  Notably cirrhosis present on abdominal imaging in 2020.  Consult: Patient reports she was not feeling well on Sunday. She notes she went to the restroom and had a BM and began feeling bad, breaking out in a sweat thereafter. She notes diarrhea began after admission. No rectal bleeding, melena or abdominal pain. She has some SOB but was unsure if this was related to her asthma acting up. Has had more fatigue recently. Denies changes in appetite. History of recurrent H pylori. She is on anti emetics 4mg  almost daily for chronic nausea. She does take Ibuprofen 800-1000mg  2-3x/day, everyday. She notes some early satiety at times. C diff test has not been collected yet.   She reports some issues with dysphagia at times with thicker foods, larger pills. Liquids dont tend to cause issues. Substances seem to stick in mid throat. Sometimes has to cough foods/pills back up. She does note heartburn on occasion, does not take anything  for this. Notes occurrence about 3x/week.   She notes history of fatty liver disease, and had an episode a few years ago where LFTs were very elevated and she was told then she may need liver transplant.   She had a few episodes of diarrhea after arriving to the ED but notes that stools were watery but have started to transition back to more solid consistency.   Weight down to 185lbs today, notably 203 lbs in September 2023.   Last EGD:09/2013 normal esophagus status post Maloney dilation. Abnormal gastric mucosa-h pylori gastritis  Last Colonoscopy: >10 years ago  Does not think there is any family history of CRC No tobacco use, alcohol is minimal, a glass of wine a few times per year   Past Medical History:  Diagnosis Date   Asthma    Chronic abdominal pain    Chronic neck pain    C4-6 fusion   CONSTIPATION 05/16/2009   Qualifier: Diagnosis of  By: 05/18/2009 FNP-BC, Kandice L    Diabetes mellitus without complication (HCC)    Dysphagia 02/23/2012   Fatty liver    GERD (gastroesophageal reflux disease)    Helicobacter pylori gastritis 2011   Hyperlipidemia    Hypertension    LIVER FUNCTION TESTS, ABNORMAL, HX OF 05/14/2009   Qualifier: Diagnosis of  By: 05/16/2009, [place]     Migraine headache    MIGRAINE, COMMON 05/14/2009   Qualifier: Diagnosis of  By: 05/16/2009     MS (multiple sclerosis) [facility])    Sleep apnea     Past Surgical History:  Procedure Laterality Date  ABDOMINAL HYSTERECTOMY     APPENDECTOMY     BACK SURGERY  03/07/2012   Nerve Stimulator   CHOLECYSTECTOMY     COLONOSCOPY  07/09/2009   RMR; normal rectum aside from anal canal hemorrhoids/scattered left-sided diverticula   ESOPHAGOGASTRODUODENOSCOPY  05/22/2009   RMR; normal /small HH   ESOPHAGOGASTRODUODENOSCOPY  2007   RMR: non-critical Schatzki's rin, non-maniuplated   ESOPHAGOGASTRODUODENOSCOPY N/A 10/24/2013   Procedure: ESOPHAGOGASTRODUODENOSCOPY (EGD);  Surgeon: Daneil Dolin, MD;  Location: [facility]  ENDO SUITE;  Service: Endoscopy;  Laterality: N/A;  9:45   ESOPHAGOGASTRODUODENOSCOPY (EGD) WITH ESOPHAGEAL DILATION  03/17/2012   WUJ:[id number] appearing esophageal mucosa of uncertain significance. Status post Venia Minks dilation followed by esophageal bx   MALONEY DILATION N/A 10/24/2013   Procedure: Venia Minks DILATION;  Surgeon: Daneil Dolin, MD;  Location: [facility] ENDO SUITE;  Service: Endoscopy;  Laterality: N/A;   MANDIBLE SURGERY     NECK SURGERY     X2, last time 2013    Prior to Admission medications   Medication Sig Start Date End Date Taking? Authorizing Provider  albuterol (ACCUNEB) 0.63 MG/3ML nebulizer solution Take 1 ampule by nebulization every 6 (six) hours as needed for wheezing or shortness of breath. 11/24/21  Yes [provider]  albuterol (VENTOLIN HFA) 108 (90 Base) MCG/ACT inhaler Inhale 1-2 puffs into the lungs every 6 (six) hours as needed for wheezing or shortness of breath.  01/02/19  Yes [provider]  baclofen (LIORESAL) 20 MG tablet Take 20 mg by mouth 3 (three) times daily.  05/08/14  Yes [provider]  citalopram (CELEXA) 40 MG tablet Take 40 mg by mouth every evening.    Yes [provider]  gabapentin (NEURONTIN) 800 MG tablet Take 800 mg by mouth 4 (four) times daily. 01/02/19  Yes [provider]  insulin isophane & regular human KwikPen (NOVOLIN 70/30 KWIKPEN) (70-30) 100 UNIT/ML KwikPen Inject 10 Units into the skin 2 (two) times daily before a meal. 12/02/21  Yes Memon, Jolaine Artist, MD  lisinopril (ZESTRIL) 5 MG tablet TAKE ONE TABLET BY MOUTH ONCE DAILY. 03/31/22  Yes Nida, Marella Chimes, MD  metoprolol succinate (TOPROL-XL) 50 MG 24 hr tablet Take 1 tablet (50 mg total) by mouth daily. Take with or immediately following a meal. 04/09/22 04/04/23 Yes Hammock, Sheri, NP  ondansetron (ZOFRAN) 4 MG tablet Take 4 mg by mouth 4 (four) times daily as needed for nausea or vomiting. 03/06/19  Yes [provider]  oxycodone  (ROXICODONE) 30 MG immediate release tablet Take 15 mg by mouth every 6 (six) hours as needed for pain. 10/31/21  Yes [provider]  pravastatin (PRAVACHOL) 40 MG tablet Take 1 tablet (40 mg total) by mouth every evening. 03/15/19  Yes Black, Lezlie Octave, NP  rOPINIRole (REQUIP) 0.5 MG tablet Take 0.5 mg by mouth at bedtime. 11/27/21  Yes [provider]  ACCU-CHEK GUIDE test strip USE TO CHECK BLOOD SUGAR 4 TIMES DAILY AS DIRECTED 03/06/22   Nida, Marella Chimes, MD  Accu-Chek Softclix Lancets lancets USE TO CHECK BLOOD SUGAR 4 TIMES DAILY AS DIRECTED 03/06/22   Nida, Marella Chimes, MD  blood glucose meter kit and supplies KIT Dispense based on patient and insurance preference. Use up to four times daily as directed. (FOR ICD-9 250.00, 250.01). 03/15/19   Geradine Girt, DO  Blood Glucose Monitoring Suppl (ACCU-CHEK GUIDE ME) w/Device KIT USE TO CHECK BLOOD SUGAR DAILY AS DIRECTED 12/31/21   Cassandria Anger, MD  clopidogrel (PLAVIX) 75 MG  tablet Take 75 mg by mouth daily. 11/27/21   [provider]  Continuous Blood Gluc Receiver (DEXCOM G7 RECEIVER) DEVI Use to check glucose as directed 10/28/21   Roma Kayser, MD  Continuous Blood Gluc Sensor (DEXCOM G7 SENSOR) MISC Change sensor every 10 days 01/19/22   Roma Kayser, MD  Insulin Pen Needle (B-D ULTRAFINE III SHORT PEN) 31G X 8 MM MISC USE TO INJECT INSULIN 2 TIMES DAILY 07/16/21   Roma Kayser, MD  metFORMIN (GLUCOPHAGE) 500 MG tablet Take 2 tablets (1,000 mg total) by mouth 2 (two) times daily. Patient not taking: Reported on 04/20/2022 10/15/21   Roma Kayser, MD    Current Facility-Administered Medications  Medication Dose Route Frequency Provider Last Rate Last Admin   acetaminophen (TYLENOL) tablet 650 mg  650 mg Oral Q6H PRN Zierle-Ghosh, Asia B, DO       Or   acetaminophen (TYLENOL) suppository 650 mg  650 mg Rectal Q6H PRN Zierle-Ghosh, Asia B, DO       baclofen (LIORESAL)  tablet 20 mg  20 mg Oral TID Zierle-Ghosh, Asia B, DO   20 mg at 04/22/22 0831   cefTRIAXone (ROCEPHIN) 2 g in sodium chloride 0.9 % 100 mL IVPB  2 g Intravenous Q24H Zierle-Ghosh, Asia B, DO 200 mL/hr at 04/22/22 0926 2 g at 04/22/22 0926   citalopram (CELEXA) tablet 40 mg  40 mg Oral QPM Zierle-Ghosh, Asia B, DO   40 mg at 04/21/22 1724   gabapentin (NEURONTIN) capsule 800 mg  800 mg Oral QID Zierle-Ghosh, Asia B, DO   800 mg at 04/22/22 0830   insulin aspart (novoLOG) injection 0-15 Units  0-15 Units Subcutaneous TID WC Zierle-Ghosh, Asia B, DO   2 Units at 04/22/22 0831   insulin aspart (novoLOG) injection 0-5 Units  0-5 Units Subcutaneous QHS Zierle-Ghosh, Asia B, DO   2 Units at 04/21/22 2118   metoprolol succinate (TOPROL-XL) 24 hr tablet 50 mg  50 mg Oral Daily Zierle-Ghosh, Asia B, DO   50 mg at 04/22/22 0830   ondansetron (ZOFRAN) tablet 4 mg  4 mg Oral Q6H PRN Zierle-Ghosh, Asia B, DO       Or   ondansetron (ZOFRAN) injection 4 mg  4 mg Intravenous Q6H PRN Zierle-Ghosh, Asia B, DO       oxyCODONE (Oxy IR/ROXICODONE) immediate release tablet 5 mg  5 mg Oral Q4H PRN Zierle-Ghosh, Asia B, DO   5 mg at 04/22/22 7262   pantoprazole (PROTONIX) EC tablet 40 mg  40 mg Oral BID Vassie Loll, MD   40 mg at 04/22/22 0831   pravastatin (PRAVACHOL) tablet 40 mg  40 mg Oral QPM Zierle-Ghosh, Asia B, DO   40 mg at 04/21/22 1723   rOPINIRole (REQUIP) tablet 0.5 mg  0.5 mg Oral QHS Zierle-Ghosh, Asia B, DO   0.5 mg at 04/21/22 2118    Allergies as of 04/19/2022 - Review Complete 04/19/2022  Allergen Reaction Noted   Peanut-containing drug products Anaphylaxis and Hives    Shellfish allergy Anaphylaxis 02/23/2018   Gluten meal  07/11/2014   Latex Other (See Comments) 01/15/2011   Prednisone Hives and Other (See Comments)    Nexium [esomeprazole magnesium] Hives 08/15/2011   Tape Rash 06/26/2014    Family History  Problem Relation Age of Onset   Cancer Mother        unknown type   Diabetes  Father    Diabetes Sister    Colon cancer Neg Hx  Social History   Socioeconomic History   Marital status: Married    Spouse name: Not on file   Number of children: Not on file   Years of education: Not on file   Highest education level: Not on file  Occupational History   Not on file  Tobacco Use   Smoking status: Never   Smokeless tobacco: Never  Vaping Use   Vaping Use: Never used  Substance and Sexual Activity   Alcohol use: Not Currently   Drug use: No   Sexual activity: Yes    Birth control/protection: Surgical  Other Topics Concern   Not on file  Social History Narrative   5 kids, raising grandchild   Social Determinants of Health   Financial Resource Strain: Not on file  Food Insecurity: Food Insecurity Present (04/20/2022)   Hunger Vital Sign    Worried About Running Out of Food in the Last Year: Often true    Ran Out of Food in the Last Year: Sometimes true  Transportation Needs: No Transportation Needs (04/20/2022)   PRAPARE - Hydrologist (Medical): No    Lack of Transportation (Non-Medical): No  Physical Activity: Not on file  Stress: Not on file  Social Connections: Not on file  Intimate Partner Violence: Not At Risk (04/20/2022)   Humiliation, Afraid, Rape, and Kick questionnaire    Fear of Current or Ex-Partner: No    Emotionally Abused: No    Physically Abused: No    Sexually Abused: No    Review of Systems   Gen: Denies any fever, chills, loss of appetite, change in weight or weight loss CV: Denies chest pain, heart palpitations, syncope, edema  Resp: Denies shortness of breath with rest, cough, wheezing, coughing up blood, and pleurisy. GI: denies melena, hematochezia, nausea, vomiting,  constipation,  odyonophagia, or weight loss. +diarrhea +dysphagia +heartburn +early satiety  GU : Denies urinary burning, blood in urine, urinary frequency, and urinary incontinence. MS: Denies joint pain, limitation of  movement, swelling, cramps, and atrophy.  Derm: Denies rash, itching, dry skin, hives. Psych: Denies depression, anxiety, memory loss, hallucinations, and confusion. Heme: Denies bruising or bleeding Neuro:  Denies any headaches, dizziness, paresthesias, shaking  Physical Exam   Vital Signs in last 24 hours: Temp:  [98.1 F (36.7 C)-98.2 F (36.8 C)] 98.1 F (36.7 C) (01/24 0435) Pulse Rate:  [69-73] 73 (01/24 0435) Resp:  [18-20] 18 (01/24 0435) BP: (142-146)/(68) 146/68 (01/24 0435) SpO2:  [96 %-97 %] 97 % (01/24 0435) Last BM Date : 04/21/22  General:   Alert,  Well-developed, well-nourished, pleasant and cooperative in NAD Head:  Normocephalic and atraumatic. Eyes:  Sclera clear, no icterus.   Conjunctiva pink. Ears:  Normal auditory acuity. Mouth:  No deformity or lesions, dentition normal. Neck:  Supple; no masses Lungs:  Clear throughout to auscultation.   No wheezes, crackles, or rhonchi. No acute distress. Heart:  Regular rate and rhythm; no murmurs, clicks, rubs,  or gallops. Abdomen:  Soft, and nondistended. TTP of epigastric region. No masses, hepatosplenomegaly or hernias noted. Normal bowel sounds, without guarding, and without rebound.   Msk:  Symmetrical without gross deformities. Normal posture. Extremities:  Without clubbing or edema. Neurologic:  Alert and  oriented x4. Skin:  Intact without significant lesions or rashes. Psych:  Alert and cooperative. Normal mood and affect.  Intake/Output from previous day: 01/23 0701 - 01/24 0700 In: 240 [P.O.:240] Out: -  Intake/Output this shift: Total I/O In: 360 [P.O.:360]  Out: -    Labs/Studies   Recent Labs Recent Labs    04/20/22 0559 04/21/22 0406 04/22/22 0417  WBC 8.0 5.7 4.3  HGB 7.3* 7.2* 7.3*  HCT 25.0* 24.3* 24.7*  PLT 168 160 163   BMET Recent Labs    04/20/22 0559 04/21/22 0406 04/22/22 0417  NA 134* 138 135  K 4.4 4.0 3.8  CL 107 108 102  CO2 21* 24 26  GLUCOSE 184* 118* 143*   BUN 23* 14 11  CREATININE 1.12* 0.67 0.68  CALCIUM 8.0* 8.0* 7.9*   LFT Recent Labs    04/19/22 2113 04/20/22 0559  PROT 7.6 6.4*  ALBUMIN 3.0* 2.4*  AST 26 25  ALT 18 17  ALKPHOS 125 98  BILITOT 0.1* 0.3   PT/INR Recent Labs    04/19/22 2113 04/20/22 0559  LABPROT 13.3 14.0  INR 1.0 1.1    Assessment   Adisynn C Mcguinness is a 57 y.o. year old female with medical history of asthma, DM, GERD, HLD, HTN, migraines, MS, sleep apnea, Cirrhosis, who presented to the ED on 1/21 for syncope, fatigue and generalized weakness. Also complained of lower back pain and dysuria.  Normocytic anemia: no rectal bleeding or melena. Hgb 7.3 today, down from 9.2 on admission, appears to have been trending down over the past few months. Iron studies with ferritin 5, iron 16, TIBC 440, saturation 4%. Last colonoscopy >10 years ago, last EGD 2015 with h pylori gastritis. She endorses frequent high dose ibuprofen use, early satiety and some heartburn a few times per week. Recommend proceeding with EGD and colonoscopy tomorrow for further evaluation as we cannot rule out Upper or lower GI source, differentials include PUD, gastritis (low suspicion for esophageal varices as source), AVMs, bleeding polyps or malignancy. Indications, risks and benefits of procedure discussed in detail with patient. Patient verbalized understanding and is in agreement to proceed with EGD/Colonoscopy with Dr. Levon Hedger tomorrow. Would recommend continuing to trend h&h and transfusing 1 unit PRBCs today, especially in preparation of procedures tomorrow.   Cirrhosis: as evidenced on imaging as far back as 2020, CT A/P wo contrast done this admission with cirrhosis and evidence of portal hypertension. LFTs WNL and plt count >150k. Discussion had with patient regarding possible etiology of cirrhosis to include fatty liver, AIH, viral infections, as well as implications of disease to include importance of routine labs and imaging every 6  months with increased risk of incidence of HCC, bleeding, especially with increased risk of esophageal varices, retention of ammonia/fluid, importance of avoiding alcohol and NSAIDs. Pt verbalized understanding of this. Will plan for further cirrhosis workup with our team on outpatient basis. For now she appears well compensated. MELD Na  7  Plan / Recommendations   Plan for EGD and colonoscopy tomorrow PPI BID Avoid NSAIDs 4. Stool studies if diarrhea persists 5. Clear liquid diet today, NPO midnight 6. Further cirrhosis workup/management as outpatient 7. Trend h&h, would recommend transfusing 1 unit PRBCs today  8. Monitor for overt GI bleeding   04/22/2022, 10:23 AM  [name] L. Jeanmarie Hubert, MSN, APRN, AGNP-C Adult-Gerontology Nurse Practitioner [facility] Gastroenterology at [facility]

## 2022-04-22 NOTE — Progress Notes (Signed)
Progress Note   Patient: [name] [id number] DOB: 07-06-1965 DOA: 04/19/2022     2 DOS: the patient was seen and examined on 04/22/2022   Brief hospital course: As per H&P written by Dr. Clearence Ped on 04/20/22 [name] is a 57 y.o. female with medical history significant of diabetes mellitus type 2, fatty liver, GERD, hyperlipidemia, hypertension, recent admission at the end of last year for sepsis secondary to UTI, and more presents the ED with a chief complaint of near syncope.  Patient appears older than stated age.  She is also poor historian.  Patient reports that she had a near syncopal event today after having a bowel movement.  She became diaphoretic felt generally unwell, and thought she would pass out.  She reports it was a normal bowel movement and not diarrhea.  It was nonbloody.  Upon further questioning she reports that she has had generalized weakness for a few days.  She has had fatigue as well.  She has felt feverish but has not had a measured temperature.  She denies dysuria, she denies diarrhea prior to her arrival here.  She has had diarrhea several times in the ED.  Patient reports no nausea or vomiting.  She does have a decreased appetite and reports that that is been going on for couple days.  She reports that she has a mild cough and minimal dyspnea, but those are both attributable to her asthma and they are no worse than normal.  Patient denies any abdominal pain.  She has not had any recent antibiotics since her last hospitalization.  Patient has no other complaints at this time.   Patient does not smoke.  She drinks on special occasions.  She does not use illicit drugs.  She is vaccinated for COVID.  Patient is full code.  Assessment and Plan: 1) Sepsis due to colitis--POA --Continue Rocephin/Flagyl- -plans for colonoscopy on 04/23/2022  Elevated troponin - -Ruled out for ACS by cardiac enzymes and EKG -Remains chest pain-free  AKI (acute kidney injury)  ([place]) - Creatinine up to 1.51 on admission from 0.74 at baseline - Secondary to sepsis pathology and UTI -Renal function normalized with hydration  Hypokalemia - Repeat left and normalized  DM2- - Continue sliding scale insulin -Recent A1c 7.0 Use Novolog/Humalog Sliding scale insulin with Accu-Cheks/Fingersticks as ordered   HTN (hypertension) - Continue holding lisinopril in the setting of hypotension and acute kidney injury viable. - Continue metoprolol and follow-up vital signs. -Maintain adequate hydration. -Heart healthy diet discussed with patient.  Class I obesity -Body mass index is 30.79 kg/m. -low calorie diet and portion control discussed with patient.  Positive fecal occult blood test/acute blood loss anemia/presumed GI bleed -Follow hemoglobin trend; transfusion threshold hemoglobin less than 7 -PPI twice a day has been initiated -Heparin and Plavix have been discontinued -GI consult appreciated plans for colonoscopy and EGD on 04/23/2022  Subjective:  -Tolerating GoLytely colonoscopy prep and liquid diet okay at this time  Physical Exam: Vitals:   04/21/22 0437 04/21/22 2101 04/22/22 0435 04/22/22 1331  BP: 130/80 (!) 142/68 (!) 146/68 129/73  Pulse: 80 69 73 73  Resp: 20 20 18    Temp: 98.5 F (36.9 C) 98.2 F (36.8 C) 98.1 F (36.7 C) 98.2 F (36.8 C)  TempSrc: Oral Oral  Oral  SpO2: 97% 96% 97% 99%  Weight:      Height:        Physical Exam  Gen:- Awake Alert, in no acute distress  HEENT:- [employer].AT, No sclera icterus Neck-Supple Neck,No JVD,.  Lungs-  CTAB , fair air movement bilaterally  CV- S1, S2 normal, RRR Abd-  +ve B.Sounds, Abd Soft, No tenderness,    Extremity/Skin:- No  edema,   good pedal pulses  Psych-affect is appropriate, oriented x3 Neuro-no new focal deficits, no tremors   Family Communication: No family at bedside.    Disposition: Home in 1 to 2 days Status is: Inpatient Remains inpatient appropriate because: Continue IV  antibiotics, follow cultures results; investigate low hemoglobin level (holding heparin products, and Plavix; GI service consulted).   Planned Discharge Destination: Home   Time spent: 35 minutes  Author: Roxan Hockey, MD 04/22/2022 5:58 PM  For on call review [email or web address].

## 2022-04-22 NOTE — Plan of Care (Signed)
  Problem: Fluid Volume: Goal: Hemodynamic stability will improve Outcome: Progressing   Problem: Coping: Goal: Ability to adjust to condition or change in health will improve Outcome: Progressing   Problem: Education: Goal: Knowledge of General Education information will improve Description: Including pain rating scale, medication(s)/side effects and non-pharmacologic comfort measures Outcome: Progressing

## 2022-04-23 ENCOUNTER — Inpatient Hospital Stay (HOSPITAL_COMMUNITY): Payer: BC Managed Care – PPO | Admitting: Anesthesiology

## 2022-04-23 ENCOUNTER — Encounter (HOSPITAL_COMMUNITY)
Admission: EM | Disposition: A | Discharge status: Home / Self Care | Payer: Self-pay | Source: Home / Self Care | Attending: Family Medicine

## 2022-04-23 ENCOUNTER — Encounter (HOSPITAL_COMMUNITY): Payer: Self-pay | Admitting: Family Medicine

## 2022-04-23 DIAGNOSIS — K5730 Diverticulosis of large intestine without perforation or abscess without bleeding: Secondary | ICD-10-CM

## 2022-04-23 DIAGNOSIS — K649 Unspecified hemorrhoids: Secondary | ICD-10-CM

## 2022-04-23 DIAGNOSIS — D500 Iron deficiency anemia secondary to blood loss (chronic): Secondary | ICD-10-CM | POA: Diagnosis not present

## 2022-04-23 DIAGNOSIS — K259 Gastric ulcer, unspecified as acute or chronic, without hemorrhage or perforation: Secondary | ICD-10-CM

## 2022-04-23 DIAGNOSIS — E1165 Type 2 diabetes mellitus with hyperglycemia: Secondary | ICD-10-CM | POA: Diagnosis not present

## 2022-04-23 DIAGNOSIS — N179 Acute kidney failure, unspecified: Secondary | ICD-10-CM | POA: Diagnosis not present

## 2022-04-23 DIAGNOSIS — K2970 Gastritis, unspecified, without bleeding: Secondary | ICD-10-CM | POA: Diagnosis not present

## 2022-04-23 DIAGNOSIS — I10 Essential (primary) hypertension: Secondary | ICD-10-CM | POA: Diagnosis not present

## 2022-04-23 HISTORY — PX: BIOPSY: SHX5522

## 2022-04-23 HISTORY — PX: COLONOSCOPY WITH PROPOFOL: SHX5780

## 2022-04-23 HISTORY — PX: ESOPHAGOGASTRODUODENOSCOPY: SHX5428

## 2022-04-23 LAB — GLUCOSE, CAPILLARY
Glucose-Capillary: 118 mg/dL — ABNORMAL HIGH (ref 70–99)
Glucose-Capillary: 128 mg/dL — ABNORMAL HIGH (ref 70–99)
Glucose-Capillary: 335 mg/dL — ABNORMAL HIGH (ref 70–99)

## 2022-04-23 SURGERY — COLONOSCOPY WITH PROPOFOL
Anesthesia: General

## 2022-04-23 MED ORDER — LACTATED RINGERS IV SOLN: INTRAVENOUS | Status: DC | PRN

## 2022-04-23 MED ORDER — PROPOFOL 10 MG/ML IV BOLUS
INTRAVENOUS | Status: DC | PRN
  Administered 2022-04-23: 30 mg via INTRAVENOUS
  Administered 2022-04-23: 50 mg via INTRAVENOUS

## 2022-04-23 MED ORDER — PROPOFOL 500 MG/50ML IV EMUL
INTRAVENOUS | Status: DC | PRN
  Administered 2022-04-23: 150 ug/kg/min via INTRAVENOUS

## 2022-04-23 MED ORDER — STERILE WATER FOR IRRIGATION IR SOLN
Status: DC | PRN
  Administered 2022-04-23: 120 mL

## 2022-04-23 MED ORDER — LIDOCAINE HCL 1 % IJ SOLN
INTRAMUSCULAR | Status: DC | PRN
  Administered 2022-04-23: 50 mg via INTRADERMAL

## 2022-04-23 NOTE — Op Note (Signed)
[facility] Patient Name: [name] Procedure Date: 04/23/2022 1:37 PM MRN: [id number] Date of Birth: [birth year] Attending MD: Katrinka Blazing , , [id number] CSN: [id number] Age: 57 Admit Type: Inpatient Procedure:                Upper GI endoscopy Indications:              Anemia Providers:                Katrinka Blazing, Sheran Fava, Lennice Sites                            Technician, Technician Referring MD:              Medicines:                Monitored Anesthesia Care Complications:            No immediate complications. Estimated Blood Loss:     Estimated blood loss: none. Procedure:                Pre-Anesthesia Assessment:                           - Prior to the procedure, a History and Physical                            was performed, and patient medications, allergies                            and sensitivities were reviewed. The patient's                            tolerance of previous anesthesia was reviewed.                           - The risks and benefits of the procedure and the                            sedation options and risks were discussed with the                            patient. All questions were answered and informed                            consent was obtained.                           - ASA Grade Assessment: III - A patient with severe                            systemic disease.                           After obtaining informed consent, the endoscope was                            passed under direct vision. Throughout the  procedure, the patient's blood pressure, pulse, and                            oxygen saturations were monitored continuously. The                            [id number]-[id number] ([id number]) scope was introduced through the                            mouth, and advanced to the second part of duodenum.                            The upper GI endoscopy was accomplished with ease.                             The patient tolerated the procedure well. Scope In: 1:54:06 PM Scope Out: 2:00:44 PM Total Procedure Duration: 0 hours 6 minutes 38 seconds  Findings:      A 2 cm hiatal hernia was present.      Seven non-bleeding cratered gastric ulcers with a clean ulcer base       (Forrest Class III) were found in the gastric body and in the gastric       antrum. The largest lesion was 8 mm in largest dimension. Biopsies were       taken with a cold forceps for Helicobacter pylori testing.      Diffuse moderate inflammation characterized by congestion (edema),       erythema and mucus was found in the entire examined stomach.      A single localized erosion was found in the duodenal bulb. Impression:               - 2 cm hiatal hernia.                           - Non-bleeding gastric ulcers with a clean ulcer                            base (Forrest Class III). Biopsied.                           - Gastritis.                           - Duodenal erosion. Moderate Sedation:      Per Anesthesia Care Recommendation:           - Use Protonix (pantoprazole) 40 mg PO BID.                           - Await pathology results.                           - Repeat upper endoscopy in 3 months for                            surveillance.                           -  No ibuprofen, naproxen, or other non-steroidal                            anti-inflammatory drugs.                           - Proceed with colonoscopy Procedure Code(s):        --- Professional ---                           [id number], Esophagogastroduodenoscopy, flexible,                            transoral; with biopsy, single or multiple Diagnosis Code(s):        --- Professional ---                           K44.9, Diaphragmatic hernia without obstruction or                            gangrene                           K25.9, Gastric ulcer, unspecified as acute or                            chronic, without hemorrhage or perforation                            K29.70, Gastritis, unspecified, without bleeding                           K26.9, Duodenal ulcer, unspecified as acute or                            chronic, without hemorrhage or perforation                           D64.9, Anemia, unspecified CPT copyright 2022 American Medical Association. All rights reserved. The codes documented in this report are preliminary and upon coder review may  be revised to meet current compliance requirements. Maylon Peppers, MD Maylon Peppers,  04/23/2022 2:44:14 PM This report has been signed electronically. Number of Addenda: 0

## 2022-04-23 NOTE — Transfer of Care (Signed)
Immediate Anesthesia Transfer of Care Note  Patient: [name]  Procedure(s) Performed: COLONOSCOPY WITH PROPOFOL ESOPHAGOGASTRODUODENOSCOPY (EGD) BIOPSY  Patient Location: PACU  Anesthesia Type:General  Level of Consciousness: awake  Airway & Oxygen Therapy: Patient Spontanous Breathing  Post-op Assessment: Report given to RN  Post vital signs: Reviewed and stable  Last Vitals:  Vitals Value Taken Time  BP 121/63 04/23/22 1444  Temp    Pulse 79 04/23/22 1444  Resp 35 04/23/22 1444  SpO2 94 % 04/23/22 1444  Vitals shown include unvalidated device data.  Last Pain:  Vitals:   04/23/22 1352  TempSrc:   PainSc: 0-No pain         Complications: No notable events documented.

## 2022-04-23 NOTE — Brief Op Note (Signed)
04/19/2022 - 04/23/2022  2:43 PM  PATIENT:  [name]  57 y.o. female  PRE-OPERATIVE DIAGNOSIS:  iron deficiency anemia, heme positive stool  POST-OPERATIVE DIAGNOSIS:  EGD: hiatal hernia, gastric ulcers (3 in body, 3 in antrum), dudodenal erosions, gastritis COLON: hemorrhoids, diverticulosis,  PROCEDURE:  Procedure(s): COLONOSCOPY WITH PROPOFOL (N/A) ESOPHAGOGASTRODUODENOSCOPY (EGD) (N/A) BIOPSY  SURGEON:  Surgeon(s) and Role:    * Harvel Quale, MD - Primary  Patient underwent EGD and colonoscopy under propofol sedation.  Tolerated the procedure adequately.    EGD FINDINGS: - 2 cm hiatal hernia.  - Non-bleeding gastric ulcers with a clean ulcer base (Forrest Class III).  Biopsied.  - Gastritis.  - Duodenal erosion.   COLONOSCOPY FINDINGS: - Hemorrhoids found on perianal exam.  - Diverticulosis in the sigmoid colon and in the descending colon.  - The distal rectum and anal verge are normal on retroflexion view.   RECOMMENDATIONS - Return patient to hospital ward for ongoing care.  - Resume previous diet.  - Repeat colonoscopy in 10 years for screening purposes.  - Use Protonix (pantoprazole) 40 mg PO BID.  - Await pathology results.  - Repeat upper endoscopy in 3 months for surveillance.  - No ibuprofen, naproxen, or other non-steroidal anti-inflammatory drugs.   Maylon Peppers, MD Gastroenterology and Hepatology [facility]

## 2022-04-23 NOTE — Progress Notes (Signed)
Progress Note   Patient: [name] DOA: 04/19/2022     3 DOS: the patient was seen and examined on 04/23/2022   Brief hospital course: As per H&P written by Dr. Clearence Ped on 04/20/22 [name] is a 57 y.o. female with medical history significant of diabetes mellitus type 2, fatty liver, GERD, hyperlipidemia, hypertension, recent admission at the end of last year for sepsis secondary to UTI, and more presents the ED with a chief complaint of near syncope.  Patient appears older than stated age.  She is also poor historian.  Patient reports that she had a near syncopal event today after having a bowel movement.  She became diaphoretic felt generally unwell, and thought she would pass out.  She reports it was a normal bowel movement and not diarrhea.  It was nonbloody.  Upon further questioning she reports that she has had generalized weakness for a few days.  She has had fatigue as well.  She has felt feverish but has not had a measured temperature.  She denies dysuria, she denies diarrhea prior to her arrival here.  She has had diarrhea several times in the ED.  Patient reports no nausea or vomiting.  She does have a decreased appetite and reports that that is been going on for couple days.  She reports that she has a mild cough and minimal dyspnea, but those are both attributable to her asthma and they are no worse than normal.  Patient denies any abdominal pain.  She has not had any recent antibiotics since her last hospitalization.  Patient has no other complaints at this time.   Patient does not smoke.  She drinks on special occasions.  She does not use illicit drugs.  She is vaccinated for COVID.  Patient is full code.  Assessment and Plan: 1) Sepsis due to colitis--POA --Currently on Rocephin -Flagyl discontinued - colonoscopy on 04/23/2022 with hemorrhoids and diverticulosis otherwise no significant acute findings  2)Elevated troponin - -Ruled out for ACS by  cardiac enzymes and EKG -Remains chest pain-free  3)AKI (acute kidney injury) ([place]) - Creatinine up to 1.51 on admission from 0.74 at baseline - Secondary to sepsis pathology and UTI -Renal function normalized with hydration  4)Positive fecal occult blood test/acute blood loss anemia/acute GI bleed from gastric ulcers and gastritis and duodenitis - colonoscopy on 04/23/2022 with hemorrhoids and diverticulosis otherwise no significant acute findings -EGD on 04/23/2022 with nonbleeding gastric ulcers in the gastric body and gastric antrum with a clean ulcer base Forrest class III as well as gastritis and duodenal erosion -The largest lesion was 8 mm in largest dimension.  -Heparin and Plavix remain on hold  Hypokalemia - Repeat left and normalized  DM2- - Continue sliding scale insulin -Recent A1c 7.0 Use Novolog/Humalog Sliding scale insulin with Accu-Cheks/Fingersticks as ordered   HTN (hypertension) - Continue holding lisinopril in the setting of hypotension and acute kidney injury viable. - Continue metoprolol and follow-up vital signs. -Maintain adequate hydration. -Heart healthy diet discussed with patient.  Class I obesity -Body mass index is 30.79 kg/m. -low calorie diet and portion control discussed with patient.  Subjective:  -Tolerated colonoscopy and EGD well -Asking for liquid diet  Physical Exam: Vitals:   04/23/22 0444 04/23/22 1242 04/23/22 1444 04/23/22 1445  BP: (!) 142/71 135/66 121/63 120/63  Pulse: 72 67 80 79  Resp: 19 19 10  (!) 35  Temp: 98.1 F (36.7 C) 98 F (36.7 C) 97.7 F (36.5 C)  TempSrc:  Oral    SpO2: 96% 99% 94% 94%  Weight:  83.9 kg    Height:  5\' 5"  (1.651 m)      Physical Exam  Gen:- Awake Alert, in no acute distress  HEENT:- [place].AT, No sclera icterus Neck-Supple Neck,No JVD,.  Lungs-  CTAB , fair air movement bilaterally  CV- S1, S2 normal, RRR Abd-  +ve B.Sounds, Abd Soft, mild epigastric tenderness, no rebound or  guarding Extremity/Skin:- No  edema,   good pedal pulses  Psych-affect is appropriate, oriented x3 Neuro-no new focal deficits, no tremors   Family Communication: No family at bedside.    Disposition: Home in 1 to 2 days Status is: Inpatient Remains inpatient appropriate because: Continue IV antibiotics, follow cultures results; investigate low hemoglobin level (holding heparin products, and Plavix; GI service consulted).   Planned Discharge Destination: Home     Author: Roxan Hockey, MD 04/23/2022 7:33 PM  For on call review [email or web address].

## 2022-04-23 NOTE — Op Note (Signed)
[facility] Patient Name: [name] Procedure Date: 04/23/2022 1:36 PM MRN: [id number] Date of Birth: 01-25-1966 Attending MD: Maylon Peppers , , [id number] CSN: [id number] Age: 57 Admit Type: Inpatient Procedure:                Colonoscopy Indications:              Anemia Providers:                Maylon Peppers, Lambert Mody, Ladoris Gene                            Technician, Technician Referring MD:              Medicines:                Monitored Anesthesia Care Complications:            No immediate complications. Estimated Blood Loss:     Estimated blood loss: none. Estimated blood loss:                            none. Procedure:                Pre-Anesthesia Assessment:                           - Prior to the procedure, a History and Physical                            was performed, and patient medications, allergies                            and sensitivities were reviewed. The patient's                            tolerance of previous anesthesia was reviewed.                           - The risks and benefits of the procedure and the                            sedation options and risks were discussed with the                            patient. All questions were answered and informed                            consent was obtained.                           - ASA Grade Assessment: III - A patient with severe                            systemic disease.                           After obtaining informed consent, the colonoscope  was passed under direct vision. Throughout the                            procedure, the patient's blood pressure, pulse, and                            oxygen saturations were monitored continuously. The                            [id number]-HQ190L ([id number]) scope was introduced through                            the anus and advanced to the the cecum, identified                            by appendiceal  orifice and ileocecal valve. The                            colonoscopy was performed without difficulty. The                            patient tolerated the procedure well. The quality                            of the bowel preparation was adequate. Scope In: 2:06:39 PM Scope Out: 2:35:48 PM Scope Withdrawal Time: 0 hours 18 minutes 10 seconds  Total Procedure Duration: 0 hours 29 minutes 9 seconds  Findings:      Hemorrhoids were found on perianal exam.      Multiple large-mouthed and small-mouthed diverticula were found in the       sigmoid colon and descending colon.      The retroflexed view of the distal rectum and anal verge was normal and       showed no anal or rectal abnormalities. Impression:               - Hemorrhoids found on perianal exam.                           - Diverticulosis in the sigmoid colon and in the                            descending colon.                           - The distal rectum and anal verge are normal on                            retroflexion view.                           - No specimens collected. Moderate Sedation:      Per Anesthesia Care Recommendation:           - Return patient to hospital ward for ongoing care.                           -  Resume previous diet.                           - Repeat colonoscopy in 10 years for screening                            purposes. Procedure Code(s):        --- Professional ---                           [phone], Colonoscopy, flexible; diagnostic, including                            collection of specimen(s) by brushing or washing,                            when performed (separate procedure) Diagnosis Code(s):        --- Professional ---                           K64.9, Unspecified hemorrhoids                           D64.9, Anemia, unspecified                           K57.30, Diverticulosis of large intestine without                            perforation or abscess without bleeding CPT  copyright 2022 American Medical Association. All rights reserved. The codes documented in this report are preliminary and upon coder review may  be revised to meet current compliance requirements. Katrinka Blazing, MD Katrinka Blazing,  04/23/2022 2:46:50 PM This report has been signed electronically. Number of Addenda: 0

## 2022-04-23 NOTE — Progress Notes (Signed)
Patient completed bowel prep and has been NPO since midnight. Multiple bowel movements noted. Stool is clear, yellow with no solid matter. Tap water enemas given x2 as ordered.

## 2022-04-23 NOTE — Anesthesia Postprocedure Evaluation (Signed)
Anesthesia Post Note  Patient: [name]  Procedure(s) Performed: COLONOSCOPY WITH PROPOFOL ESOPHAGOGASTRODUODENOSCOPY (EGD) BIOPSY  Patient location during evaluation: PACU Anesthesia Type: General Level of consciousness: awake and alert Pain management: pain level controlled Vital Signs Assessment: post-procedure vital signs reviewed and stable Respiratory status: spontaneous breathing Cardiovascular status: blood pressure returned to baseline and stable Postop Assessment: no apparent nausea or vomiting Anesthetic complications: no   No notable events documented.   Last Vitals:  Vitals:   04/23/22 1242 04/23/22 1444  BP: 135/66 121/63  Pulse: 67 80  Resp: 19 10  Temp: 36.7 C 36.5 C  SpO2: 99% 94%    Last Pain:  Vitals:   04/23/22 1444  TempSrc:   PainSc: 0-No pain                 Sheva Mcdougle

## 2022-04-23 NOTE — Anesthesia Preprocedure Evaluation (Signed)
Anesthesia Evaluation  Patient identified by MRN, date of birth, ID band Patient awake    Reviewed: Allergy & Precautions, H&P , NPO status , Patient's Chart, lab work & pertinent test results, reviewed documented beta blocker date and time   Airway Mallampati: III  TM Distance: >3 FB Neck ROM: Full   Comment: Cervical spondylosis, neck sx Dental  (+) Dental Advisory Given, Missing   Pulmonary asthma , sleep apnea    Pulmonary exam normal breath sounds clear to auscultation       Cardiovascular hypertension, Pt. on medications and Pt. on home beta blockers Normal cardiovascular exam Rhythm:Regular Rate:Normal     Neuro/Psych  Headaches PSYCHIATRIC DISORDERS  Depression     Neuromuscular disease (multiple sclerosis)    GI/Hepatic Neg liver ROS,GERD  Medicated,,  Endo/Other  diabetes, Poorly Controlled, Type 2, Oral Hypoglycemic Agents, Insulin Dependent Hyperthyroidism   Renal/GU Renal disease  negative genitourinary   Musculoskeletal  (+) Arthritis ,    Abdominal   Peds negative pediatric ROS (+)  Hematology  (+) Blood dyscrasia, anemia   Anesthesia Other Findings   Reproductive/Obstetrics negative OB ROS                             Anesthesia Physical Anesthesia Plan  ASA: 3  Anesthesia Plan: General   Post-op Pain Management: Minimal or no pain anticipated   Induction: Intravenous  PONV Risk Score and Plan: 1 and Propofol infusion  Airway Management Planned: Nasal Cannula and Natural Airway  Additional Equipment:   Intra-op Plan:   Post-operative Plan:   Informed Consent: I have reviewed the patients History and Physical, chart, labs and discussed the procedure including the risks, benefits and alternatives for the proposed anesthesia with the patient or authorized representative who has indicated his/her understanding and acceptance.     Dental advisory given  Plan  Discussed with: CRNA and Surgeon  Anesthesia Plan Comments:         Anesthesia Quick Evaluation

## 2022-04-23 NOTE — Progress Notes (Signed)
We will proceed with EGD/colonoscopy as scheduled.  I thoroughly discussed with the patient the procedure, including the risks involved. Patient understands what the procedure involves including the benefits and any risks. Patient understands alternatives to the proposed procedure. Risks including (but not limited to) bleeding, tearing of the lining (perforation), rupture of adjacent organs, problems with heart and lung function, infection, and medication reactions. A small percentage of complications may require surgery, hospitalization, repeat endoscopic procedure, and/or transfusion.  Patient understood and agreed.  Justin Buechner Castaneda, MD Gastroenterology and Hepatology [facility]  

## 2022-04-24 ENCOUNTER — Telehealth (INDEPENDENT_AMBULATORY_CARE_PROVIDER_SITE_OTHER): Payer: Self-pay | Admitting: Gastroenterology

## 2022-04-24 DIAGNOSIS — N179 Acute kidney failure, unspecified: Secondary | ICD-10-CM | POA: Diagnosis not present

## 2022-04-24 DIAGNOSIS — I10 Essential (primary) hypertension: Secondary | ICD-10-CM | POA: Diagnosis not present

## 2022-04-24 DIAGNOSIS — D500 Iron deficiency anemia secondary to blood loss (chronic): Secondary | ICD-10-CM | POA: Diagnosis not present

## 2022-04-24 LAB — CULTURE, BLOOD (ROUTINE X 2)
Culture: NO GROWTH
Culture: NO GROWTH
Special Requests: ADEQUATE
Special Requests: ADEQUATE

## 2022-04-24 LAB — CBC
HCT: 23.3 % — ABNORMAL LOW (ref 36.0–46.0)
Hemoglobin: 6.8 g/dL — CL (ref 12.0–15.0)
MCH: 22.4 pg — ABNORMAL LOW (ref 26.0–34.0)
MCHC: 29.2 g/dL — ABNORMAL LOW (ref 30.0–36.0)
MCV: 76.6 fL — ABNORMAL LOW (ref 80.0–100.0)
Platelets: 149 10*3/uL — ABNORMAL LOW (ref 150–400)
RBC: 3.04 MIL/uL — ABNORMAL LOW (ref 3.87–5.11)
RDW: 15.7 % — ABNORMAL HIGH (ref 11.5–15.5)
WBC: 3 10*3/uL — ABNORMAL LOW (ref 4.0–10.5)
nRBC: 0 % (ref 0.0–0.2)

## 2022-04-24 LAB — GLUCOSE, CAPILLARY
Glucose-Capillary: 134 mg/dL — ABNORMAL HIGH (ref 70–99)
Glucose-Capillary: 241 mg/dL — ABNORMAL HIGH (ref 70–99)
Glucose-Capillary: 113 mg/dL — ABNORMAL HIGH (ref 70–99)

## 2022-04-24 LAB — ABO/RH: ABO/RH(D): O POS

## 2022-04-24 LAB — PREPARE RBC (CROSSMATCH)

## 2022-04-24 MED ORDER — PANTOPRAZOLE SODIUM 40 MG IV SOLR
40.0000 mg | Freq: Two times a day (BID) | INTRAVENOUS | Status: DC
  Administered 2022-04-24 – 2022-04-25 (×3): 40 mg via INTRAVENOUS
  Filled 2022-04-24 (×3): qty 10

## 2022-04-24 MED ORDER — SODIUM CHLORIDE 0.9% IV SOLUTION: Freq: Once | INTRAVENOUS | Status: AC

## 2022-04-24 MED ORDER — INSULIN ASPART 100 UNIT/ML IJ SOLN
3.0000 [IU] | Freq: Three times a day (TID) | INTRAMUSCULAR | Status: DC
  Administered 2022-04-25: 3 [IU] via SUBCUTANEOUS

## 2022-04-24 MED ORDER — INSULIN ASPART 100 UNIT/ML IJ SOLN
0.0000 [IU] | Freq: Three times a day (TID) | INTRAMUSCULAR | Status: DC
  Administered 2022-04-24: 5 [IU] via SUBCUTANEOUS
  Administered 2022-04-25 (×2): 3 [IU] via SUBCUTANEOUS

## 2022-04-24 NOTE — Inpatient Diabetes Management (Signed)
Inpatient Diabetes Program Recommendations  AACE/ADA: New Consensus Statement on Inpatient Glycemic Control   Target Ranges:  Prepandial:   less than 140 mg/dL      Peak postprandial:   less than 180 mg/dL (1-2 hours)      Critically ill patients:  140 - 180 mg/dL    Latest Reference Range & Units 04/23/22 11:19 04/23/22 16:43 04/23/22 21:28 04/24/22 07:21  Glucose-Capillary 70 - 99 mg/dL 118 (H) 128 (H) 335 (H) 134 (H)    Review of Glycemic Control  Diabetes history: DM2 Outpatient Diabetes medications: 70/30 10 units BID Current orders for Inpatient glycemic control: Novolog 0-15 units TID with meals, Novolog 0-5 units QHS  Inpatient Diabetes Program Recommendations:    Insulin: Please consider ordering Novolog 3 units TID with meals for meal coverage if patient eats at least 50% of meals.  Thanks, Barnie Alderman, RN, MSN, [place] [facility] Coordinator Inpatient Diabetes Program [phone] (Team Pager from 8am to [place])

## 2022-04-24 NOTE — Telephone Encounter (Signed)
[name] - Please arrange hospital follow up for patient in about 4 weeks with Dr. Gala Romney or any APP to discuss cirrhosis and repeat EGD due to gastric ulcers.   [name] - please arrange CBC in 1 week upon discharge. Dx: anemia   Venetia Night, MSN, APRN, FNP-BC, AGACNP-BC [facility] at [facility]

## 2022-04-24 NOTE — Progress Notes (Signed)
Pt reported severe pain in back during the night. PRN 5 mg Oxycodone given and has been effective in controlling pt's pain with 1/10 reported after administration. Vitals stable.

## 2022-04-24 NOTE — Progress Notes (Signed)
Progress Note   Patient: [name]:[id number] DOB: [birth year] DOA: 04/19/2022     4 DOS: the patient was seen and examined on 04/24/2022   Brief hospital course: As per H&P written by Dr. Clearence Ped on 04/20/22 [name] is a 57 y.o. female with medical history significant of diabetes mellitus type 2, fatty liver, GERD, hyperlipidemia, hypertension, recent admission at the end of last year for sepsis secondary to UTI, and more presents the ED with a chief complaint of near syncope.  Patient appears older than stated age.  She is also poor historian.  Patient reports that she had a near syncopal event today after having a bowel movement.  She became diaphoretic felt generally unwell, and thought she would pass out.  She reports it was a normal bowel movement and not diarrhea.  It was nonbloody.  Upon further questioning she reports that she has had generalized weakness for a few days.  She has had fatigue as well.  She has felt feverish but has not had a measured temperature.  She denies dysuria, she denies diarrhea prior to her arrival here.  She has had diarrhea several times in the ED.  Patient reports no nausea or vomiting.  She does have a decreased appetite and reports that that is been going on for couple days.  She reports that she has a mild cough and minimal dyspnea, but those are both attributable to her asthma and they are no worse than normal.  Patient denies any abdominal pain.  She has not had any recent antibiotics since her last hospitalization.  Patient has no other complaints at this time.   Patient does not smoke.  She drinks on special occasions.  She does not use illicit drugs.  She is vaccinated for COVID.  Patient is full code.  Assessment and Plan: 1) Sepsis due to Colitis--POA --Completed Rocephin on 04/24/22 -Flagyl discontinued - colonoscopy on 04/23/2022 with hemorrhoids and diverticulosis otherwise no significant acute findings -Blood and urine cultures from  04/19/2022 negative so no further antibiotics at this time  2)Elevated troponin - -Ruled out for ACS by cardiac enzymes and EKG -Remains chest pain-free  3)AKI (acute kidney injury) ([place]) - Creatinine up to 1.51 on admission from 0.74 at baseline - Secondary to sepsis pathology and UTI -Renal function normalized with hydration  4)Positive fecal occult blood test/acute blood loss anemia/acute GI bleed from gastric ulcers and gastritis and duodenitis - colonoscopy on 04/23/2022 with hemorrhoids and diverticulosis otherwise no significant acute findings -EGD on 04/23/2022 with nonbleeding gastric ulcers in the gastric body and gastric antrum with a clean ulcer base Forrest class III as well as gastritis and duodenal erosion -The largest lesion was 8 mm in largest dimension.  04/24/22- -Hgb dropped to 6.8, discussed with GI team will transfuse 1 unit of PRBC on 04/21/2022, change Protonix to IV 40 mg every 12 hours -Heparin and Plavix remain on hold -Monitor Hgb and if no further bleeding or drop in Hgb over the next 24 to 48 hours may discharge home with outpatient GI follow-up -Per GI service patient will need repeat EGD in 3 months -H. pylori testing pending  Hypokalemia - Repeat left and normalized  DM2- -Recent A1c 7.0 Use Novolog/Humalog Sliding scale insulin with Accu-Cheks/Fingersticks as ordered   HTN (hypertension) - Continue holding lisinopril in the setting of hypotension and acute kidney injury viable. - Continue metoprolol and follow-up vital signs. -Maintain adequate hydration. -Heart healthy diet discussed with patient.  Class I  obesity -Body mass index is 30.79 kg/m. -low calorie diet and portion control discussed with patient.  Subjective:  -Had brown stool -Tolerating  diet okay -Complains of fatigue and generalized weakness in the setting of drop in Hgb  Physical Exam: Vitals:   04/23/22 1444 04/23/22 1445 04/23/22 2127 04/24/22 0626  BP: 121/63 120/63 (!)  132/59 134/67  Pulse: 80 79 70 72  Resp: 10 (!) 35 18 16  Temp: 97.7 F (36.5 C)  98.4 F (36.9 C) 98.3 F (36.8 C)  TempSrc:   Oral Oral  SpO2: 94% 94% 100% 96%  Weight:      Height:        Physical Exam  Gen:- Awake Alert, in no acute distress  HEENT:- [place].AT, No sclera icterus Neck-Supple Neck,No JVD,.  Lungs-  CTAB , fair air movement bilaterally  CV- S1, S2 normal, RRR Abd-  +ve B.Sounds, Abd Soft, mild epigastric tenderness, no rebound or guarding Extremity/Skin:- No  edema,   good pedal pulses  Psych-affect is appropriate, oriented x3 Neuro-no new focal deficits, no tremors   Family Communication: No family at bedside.    Disposition: Home in 1 day Status is: Inpatient   Planned Discharge Destination: Home  Author: Roxan Hockey, MD 04/24/2022 10:14 AM  For on call review [email or web address].

## 2022-04-24 NOTE — Progress Notes (Signed)
Gastroenterology Progress Note   Referring Provider: No ref. provider found Primary Care Physician:  Elfredia Nevins, MD Primary Gastroenterologist:  Dr. Gerrit Friends.Rourk, MD   Patient ID: PUANANI GENE; [id number]; 1965/05/10    Subjective   Tolerating regular diet for breakfast this morning. Mild abdominal tenderness. No N/V. Some fatigue today. Currently receiving blood. No melena or BRBPR.    Objective   Vital signs in last 24 hours Temp:  [97.7 F (36.5 C)-98.4 F (36.9 C)] 98.3 F (36.8 C) (01/26 0626) Pulse Rate:  [67-80] 72 (01/26 0626) Resp:  [10-35] 16 (01/26 0626) BP: (120-135)/(59-67) 134/67 (01/26 0626) SpO2:  [94 %-100 %] 96 % (01/26 0626) Weight:  [83.9 kg] 83.9 kg (01/25 1242) Last BM Date : 04/24/22  Physical Exam General:   Alert and oriented, pleasant Head:  Normocephalic and atraumatic. Eyes:  No icterus, sclera clear. Conjuctiva pink.  Heart:  S1, S2 present, no murmurs noted.  Lungs: Clear to auscultation bilaterally, without wheezing, rales, or rhonchi.  Abdomen:  Bowel sounds present, soft, non-tender, non-distended. No HSM or hernias noted. No rebound or guarding. No masses appreciated Msk:  Symmetrical without gross deformities. Normal posture. Neurologic:  Alert and  oriented x4;  grossly normal neurologically. Psych:  Alert and cooperative. Normal mood and affect.  Intake/Output from previous day: 01/25 0701 - 01/26 0700 In: 840 [P.O.:240; I.V.:600] Out: 0  Intake/Output this shift: No intake/output data recorded.  Lab Results  Recent Labs    04/22/22 0417 04/24/22 0717  WBC 4.3 3.0*  HGB 7.3* 6.8*  HCT 24.7* 23.3*  PLT 163 149*   BMET Recent Labs    04/22/22 0417  NA 135  K 3.8  CL 102  CO2 26  GLUCOSE 143*  BUN 11  CREATININE 0.68  CALCIUM 7.9*   LFT No results for input(s): "PROT", "ALBUMIN", "AST", "ALT", "ALKPHOS", "BILITOT", "BILIDIR", "IBILI" in the last 72 hours. PT/INR No results for input(s): "LABPROT",  "INR" in the last 72 hours. Hepatitis Panel No results for input(s): "HEPBSAG", "HCVAB", "HEPAIGM", "HEPBIGM" in the last 72 hours.  Studies/Results CT CHEST ABDOMEN PELVIS WO CONTRAST  Result Date: 04/20/2022 CLINICAL DATA:  Sepsis. EXAM: CT CHEST, ABDOMEN AND PELVIS WITHOUT CONTRAST TECHNIQUE: Multidetector CT imaging of the chest, abdomen and pelvis was performed following the standard protocol without IV contrast. RADIATION DOSE REDUCTION: This exam was performed according to the departmental dose-optimization program which includes automated exposure control, adjustment of the mA and/or kV according to patient size and/or use of iterative reconstruction technique. COMPARISON:  CT abdomen pelvis dated 11/28/2021. FINDINGS: Evaluation of this exam is limited in the absence of intravenous contrast. CT CHEST FINDINGS Cardiovascular: There is no cardiomegaly or pericardial effusion. Mild atherosclerotic calcification of the thoracic aorta. No aneurysmal dilatation. The central pulmonary arteries are grossly unremarkable. Mediastinum/Nodes: No hilar or mediastinal adenopathy. The esophagus is grossly unremarkable. No mediastinal fluid collection. Lungs/Pleura: Bibasilar subpleural atelectasis/scarring. There is a 5 mm right middle lobe nodule (79/3) as well as a 5 mm subpleural nodule in the right lower lobe (66/3). No focal consolidation, pleural effusion, or pneumothorax. The central airways are patent. Musculoskeletal: Partially visualized lower cervical ACDF. No acute osseous pathology. Thoracic spine stimulator. CT ABDOMEN PELVIS FINDINGS No intra-abdominal free air or free fluid. Hepatobiliary: Morphologic changes of cirrhosis. No biliary dilatation. Cholecystectomy. No retained calcified stone noted in the central CBD. Pancreas: Fatty infiltration of the pancreas. Mild infiltration of the peripancreatic fat, likely related to diffuse mesenteric infiltration and cirrhosis. Correlation  with pancreatic  enzymes recommended to exclude acute pancreatitis. Spleen: Normal in size without focal abnormality. Adrenals/Urinary Tract: The adrenal glands are unremarkable. There is no hydronephrosis or nephrolithiasis on either side. The visualized ureters and urinary bladder appear unremarkable. Stomach/Bowel: There is scattered sigmoid diverticula without active inflammatory changes. Mild diffuse pericolonic stranding, likely related to hepatic colopathy. Colitis is less likely but not excluded clinical correlation is recommended. There is no bowel obstruction. Appendectomy. Vascular/Lymphatic: Mild aortoiliac atherosclerotic disease. The IVC is unremarkable. No portal venous gas. Probable recanalization of the umbilical vein. Indeterminate 7 mm nodular density in the omentum (65/2), likely a lymph node. No adenopathy. Reproductive: Hysterectomy.  No adnexal masses. Other: None Musculoskeletal: No acute or significant osseous findings. IMPRESSION: 1. No acute intrathoracic pathology. 2. Cirrhosis with evidence of portal hypertension. 3. Mild diffuse pericolonic stranding, likely related to hepatic colopathy. Colitis is less likely but not excluded clinical correlation is recommended. No bowel obstruction. 4. Sigmoid diverticulosis. 5. Small pulmonary nodules measuring up to 5 mm. No follow-up needed if patient is low-risk (and has no known or suspected primary neoplasm). Non-contrast chest CT can be considered in 12 months if patient is high-risk. This recommendation follows the consensus statement: Guidelines for Management of Incidental Pulmonary Nodules Detected on CT Images: From the Fleischner Society 2017; Radiology 2017; 284:228-243. 6.  Aortic Atherosclerosis (ICD10-I70.0). Electronically Signed   By: Anner Crete M.D.   On: 04/20/2022 00:17   DG Chest Port 1 View  Result Date: 04/19/2022 CLINICAL DATA:  Possible sepsis. EXAM: PORTABLE CHEST 1 VIEW COMPARISON:  11/28/2021. FINDINGS: The heart is enlarged and  the mediastinal contour is within normal limits. Lung volumes are low and there is chronic elevation of the right diaphragm. No consolidation, effusion, or pneumothorax. Neurostimulator leads are present over the midline. Cervical spinal fusion hardware is noted. IMPRESSION: No active disease. Electronically Signed   By: Brett Fairy M.D.   On: 04/19/2022 21:49    Assessment  57 y.o. female with a history of asthma, diabetes, GERD, HLD, HTN, migraines, MS, sleep apnea, cirrhosis who presented to the ED 1/21 for syncope, fatigue, generalized weakness.  Also had complaints of lower back pain and dysuria.  GI consulted for further evaluation of anemia.   Normocytic anemia: Hemoglobin 9.2 on admission, had decreased to 7.3 which appeared to be a slow downtrend over a few months.  1 unit PRBC given on 1/24 preparation for EGD/colonoscopy on 1/25.  Repeat hemoglobin today 6.8.  She will receive an additional unit of PRBC today.  Iron studies with ferritin 5, iron 16, saturation 4%.  Had EGD in 2015 with H. pylori gastritis.  No documented eradication.  Repeat EGD and colonoscopy performed 1/25.  EGD with 2 cm hiatal hernia, nonbleeding gastric ulcers with clean ulcer base, gastritis, duodenal erosion.  Colonoscopy with hemorrhoids on perianal exam, left-sided diverticulosis, otherwise normal.  Patient admitted to frequent high-dose NSAID use which is likely etiology of her gastritis and duodenal erosions.  Tolerating regular diet. Mild worsening fatigue today in the setting of slight drop in hgb. She will need to continue on PPI twice daily and repeat EGD in 3 months for surveillance.  Cirrhosis: Noted on imaging from 2020.  CT A/P this admission with cirrhosis and evidence of portal hypertension.  LFTs remain within normal limits and no evidence of thrombocytopenia.  Potential etiologies for cirrhosis previously discussed with patient and advised that we will perform further workup on outpatient basis.  MELD  sodium 7.  EGD  this admission for anemia without evidence of varices. Discussed following high protein, low fat die, avoiding red meat if possible and reinforced avoiding NSAIDs.    Plan / Recommendations  Agree with additional unit of PRBC today Avoid NSAIDs PPI BID Continue to monitor H/H, transfuse for hgb < 7 Follow-up pathology results Repeat colonoscopy in 10 years CBC 1 week post discharge Follow low fat, high protein diet Follow-up in the clinic in about 4 weeks for further workup of cirrhosis and discuss scheduling repeat upper endoscopy    LOS: 4 days    04/24/2022, 11:10 AM   Venetia Night, MSN, FNP-BC, AGACNP-BC [facility]

## 2022-04-25 DIAGNOSIS — D500 Iron deficiency anemia secondary to blood loss (chronic): Secondary | ICD-10-CM | POA: Diagnosis not present

## 2022-04-25 DIAGNOSIS — E876 Hypokalemia: Secondary | ICD-10-CM | POA: Diagnosis not present

## 2022-04-25 DIAGNOSIS — N179 Acute kidney failure, unspecified: Secondary | ICD-10-CM | POA: Diagnosis not present

## 2022-04-25 DIAGNOSIS — E1165 Type 2 diabetes mellitus with hyperglycemia: Secondary | ICD-10-CM | POA: Diagnosis not present

## 2022-04-25 LAB — CBC
HCT: 26.8 % — ABNORMAL LOW (ref 36.0–46.0)
Hemoglobin: 8.2 g/dL — ABNORMAL LOW (ref 12.0–15.0)
MCH: 23.5 pg — ABNORMAL LOW (ref 26.0–34.0)
MCHC: 30.6 g/dL (ref 30.0–36.0)
MCV: 76.8 fL — ABNORMAL LOW (ref 80.0–100.0)
Platelets: 152 10*3/uL (ref 150–400)
RBC: 3.49 MIL/uL — ABNORMAL LOW (ref 3.87–5.11)
RDW: 15.8 % — ABNORMAL HIGH (ref 11.5–15.5)
WBC: 3.5 10*3/uL — ABNORMAL LOW (ref 4.0–10.5)
nRBC: 0 % (ref 0.0–0.2)

## 2022-04-25 LAB — GLUCOSE, CAPILLARY
Glucose-Capillary: 159 mg/dL — ABNORMAL HIGH (ref 70–99)
Glucose-Capillary: 151 mg/dL — ABNORMAL HIGH (ref 70–99)

## 2022-04-25 LAB — BASIC METABOLIC PANEL
Anion gap: 6 (ref 5–15)
BUN: 11 mg/dL (ref 6–20)
CO2: 26 mmol/L (ref 22–32)
Calcium: 8.2 mg/dL — ABNORMAL LOW (ref 8.9–10.3)
Chloride: 105 mmol/L (ref 98–111)
Creatinine, Ser: 0.64 mg/dL (ref 0.44–1.00)
GFR, Estimated: >60 mL/min (ref >60)
Glucose, Bld: 214 mg/dL — ABNORMAL HIGH (ref 70–99)
Potassium: 3.8 mmol/L (ref 3.5–5.1)
Sodium: 137 mmol/L (ref 135–145)

## 2022-04-25 MED ORDER — PANTOPRAZOLE SODIUM 40 MG PO TBEC: 40.0000 mg | DELAYED_RELEASE_TABLET | Freq: Two times a day (BID) | ORAL | 11 refills | Status: DC

## 2022-04-25 MED ORDER — METOPROLOL SUCCINATE ER 50 MG PO TB24: 50.0000 mg | ORAL_TABLET | Freq: Every day | ORAL | 3 refills | Status: AC

## 2022-04-25 MED ORDER — ONDANSETRON HCL 4 MG PO TABS: 4.0000 mg | ORAL_TABLET | Freq: Four times a day (QID) | ORAL | 0 refills | Status: AC | PRN

## 2022-04-25 MED ORDER — METFORMIN HCL 1000 MG PO TABS: 1000.0000 mg | ORAL_TABLET | Freq: Two times a day (BID) | ORAL | 4 refills | Status: DC

## 2022-04-25 MED ORDER — SUCRALFATE 1 G PO TABS: 1.0000 g | ORAL_TABLET | Freq: Four times a day (QID) | ORAL | 1 refills | Status: DC

## 2022-04-25 MED ORDER — LISINOPRIL 5 MG PO TABS: 5.0000 mg | ORAL_TABLET | Freq: Every day | ORAL | 4 refills | Status: DC

## 2022-04-25 NOTE — Discharge Summary (Signed)
[name], is a 57 y.o. female  DOB Apr 10, 1965  MRN [id number].  Admission date:  04/19/2022  Admitting Physician  Lilyan Gilford, DO  Discharge Date:  04/25/2022   Primary MD  Elfredia Nevins, MD  Recommendations for primary care physician for things to follow:   1)Follow-up Gastroenterologist Dr. Jena Gauss with [facility]---in 4 weeks for evaluation  to discuss Liver Cirrhosis and Repeat EGD/Endoscopy due to gastric ulcers.  -address: [address] [place], [place] [address], Phone: [phone]  2)Repeat  CBC and CMP Blood Tests in 1 week with Elfredia Nevins, MD   3)Avoid ibuprofen/Advil/Aleve/Motrin/Goody Powders/Naproxen/BC powders/Meloxicam/Diclofenac/Indomethacin and other Nonsteroidal anti-inflammatory medications as these will make you more likely to bleed and can cause stomach ulcers, can also cause Kidney problems.   Admission Diagnosis  Syncope and collapse [R55] Sepsis (HCC) [A41.9] Sepsis, due to unspecified organism, unspecified whether acute organ dysfunction present [facility]) [A41.9]   Discharge Diagnosis  Syncope and collapse [R55] Sepsis (HCC) [A41.9] Sepsis, due to unspecified organism, unspecified whether acute organ dysfunction present [facility]) [A41.9]    Principal Problem:   Sepsis (HCC) Active Problems:   HTN (hypertension)   Mixed hyperlipidemia   Uncontrolled type 2 diabetes mellitus with hyperglycemia (HCC)   Hypokalemia   AKI (acute kidney injury) (HCC)   Elevated troponin   Iron deficiency anemia due to chronic blood loss      Past Medical History:  Diagnosis Date   Asthma    Chronic abdominal pain    Chronic neck pain    C4-6 fusion   CONSTIPATION 05/16/2009   Qualifier: Diagnosis of  By: Yetta Barre FNP-BC, Kandice L    Diabetes mellitus without complication (HCC)    Dysphagia 02/23/2012   Fatty liver    GERD (gastroesophageal reflux  disease)    Helicobacter pylori gastritis 2011   Hyperlipidemia    Hypertension    LIVER FUNCTION TESTS, ABNORMAL, HX OF 05/14/2009   Qualifier: Diagnosis of  By: Levi Aland, [place]     Migraine headache    MIGRAINE, COMMON 05/14/2009   Qualifier: Diagnosis of  By: Ricard Dillon     MS (multiple sclerosis) [facility])    Sleep apnea     Past Surgical History:  Procedure Laterality Date   ABDOMINAL HYSTERECTOMY     APPENDECTOMY     BACK SURGERY  03/07/2012   Nerve Stimulator   CHOLECYSTECTOMY     COLONOSCOPY  07/09/2009   RMR; normal rectum aside from anal canal hemorrhoids/scattered left-sided diverticula   ESOPHAGOGASTRODUODENOSCOPY  05/22/2009   RMR; normal /small HH   ESOPHAGOGASTRODUODENOSCOPY  2007   RMR: non-critical Schatzki's rin, non-maniuplated   ESOPHAGOGASTRODUODENOSCOPY N/A 10/24/2013   Procedure: ESOPHAGOGASTRODUODENOSCOPY (EGD);  Surgeon: Corbin Ade, MD;  [facility];  Service: Endoscopy;  Laterality: N/A;  9:45   ESOPHAGOGASTRODUODENOSCOPY (EGD) WITH ESOPHAGEAL DILATION  03/17/2012   JSR:PRXYVOPF appearing esophageal mucosa of uncertain significance. Status post [name] dilation followed by esophageal bx   MALONEY DILATION N/A 10/24/2013   Procedure: MALONEY DILATION;  Surgeon:  Corbin Ade, MD;  [facility]: [facility];  Service: Endoscopy;  Laterality: N/A;   MANDIBLE SURGERY     NECK SURGERY     X2, last time 2013    HPI  from the history and physical done on the day of admission:     HPI: [name] is a 57 y.o. female with medical history significant of diabetes mellitus type 2, fatty liver, GERD, hyperlipidemia, hypertension, recent admission at the end of last year for sepsis secondary to UTI, and more presents the ED with a chief complaint of near syncope.  Patient appears older than stated age.  She is also poor historian.  Patient reports that she had a near syncopal event today after having a bowel movement.  She became diaphoretic felt  generally unwell, and thought she would pass out.  She reports it was a normal bowel movement and not diarrhea.  It was nonbloody.  Upon further questioning she reports that she has had generalized weakness for a few days.  She has had fatigue as well.  She has felt feverish but has not had a measured temperature.  She denies dysuria, she denies diarrhea prior to her arrival here.  She has had diarrhea several times in the ED.  Patient reports no nausea or vomiting.  She does have a decreased appetite and reports that that is been going on for couple days.  She reports that she has a mild cough and minimal dyspnea, but those are both attributable to her asthma and they are no worse than normal.  Patient denies any abdominal pain.  She has not had any recent antibiotics since her last hospitalization.  Patient has no other complaints at this time.   Patient does not smoke.  She drinks on special occasions.  She does not use illicit drugs.  She is vaccinated for COVID.  Patient is full code. Review of Systems: As mentioned in the history of present illness. All other systems reviewed and are negative.     Hospital Course:     Assessment and Plan: 1) Sepsis due to Colitis--POA --Completed Rocephin on 04/24/22 -Flagyl discontinued - colonoscopy on 04/23/2022 with hemorrhoids and diverticulosis otherwise no significant acute findings -Blood and urine cultures from 04/19/2022 negative so no further antibiotics at this time   2)Elevated troponin - -Ruled out for ACS by cardiac enzymes and EKG -Remains chest pain-free   3)AKI (acute kidney injury) (HCC) - Creatinine up to 1.51 on admission from 0.74 at baseline - Secondary to sepsis pathology and UTI -Renal function normalized with hydration   4)Positive fecal occult blood test/acute blood loss anemia/acute GI bleed from gastric ulcers and gastritis and duodenitis - colonoscopy on 04/23/2022 with hemorrhoids and diverticulosis otherwise no significant  acute findings -EGD on 04/23/2022 with nonbleeding gastric ulcers in the gastric body and gastric antrum with a clean ulcer base Forrest class III as well as gastritis and duodenal erosion -The largest lesion was 8 mm in largest dimension.  04/24/22- -Hgb dropped to 6.8, discussed with GI team will transfuse 1 unit of PRBC on 04/21/2022, change Protonix to IV 40 mg every 12 hours -Heparin and Plavix remain on hold -Monitor Hgb and if no further bleeding or drop in Hgb over the next 24 to 48 hours may discharge home with outpatient GI follow-up -Per GI service patient will need repeat EGD in 3 months -H. pylori testing pending   Hypokalemia - Repeat left and normalized   DM2- -Recent A1c 7.0  Use Novolog/Humalog Sliding scale insulin with Accu-Cheks/Fingersticks as ordered    HTN (hypertension) - Continue holding lisinopril in the setting of hypotension and acute kidney injury viable. - Continue metoprolol and follow-up vital signs. -Maintain adequate hydration. -Heart healthy diet discussed with patient.   Class I obesity -Body mass index is 30.79 kg/m. -low calorie diet and portion control discussed with patient.      Discharge Condition: ***  Follow UP   Follow-up Information     Corbin Ade, MD. Call in 4 week(s).   Specialty: Gastroenterology Contact information: [address] [place] [address] [phone]         Elfredia Nevins, MD Follow up in 1 week(s).   Specialty: Internal Medicine Why: for Repeat CBC and CMP Contact information: [address] [place] [address] [phone]                  Consults obtained - Gi  Diet and Activity recommendation:  As advised  Discharge Instructions    Discharge Instructions     Call MD for:  difficulty breathing, headache or visual disturbances   Complete by: As directed    Call MD for:  persistant dizziness or light-headedness   Complete by: As directed    Call MD for:   persistant nausea and vomiting   Complete by: As directed    Call MD for:  temperature >100.4   Complete by: As directed    Diet - low sodium heart healthy   Complete by: As directed    Diet Carb Modified   Complete by: As directed    Discharge instructions   Complete by: As directed    1)Follow-up Gastroenterologist Dr. Jena Gauss with [facility]---in 4 weeks for evaluation  to discuss Liver Cirrhosis and Repeat EGD/Endoscopy due to gastric ulcers.  -address: [address], [place], [place] [address], Phone: [phone]  2)Repeat  CBC and CMP Blood Tests in 1 week with Elfredia Nevins, MD   3)Avoid ibuprofen/Advil/Aleve/Motrin/Goody Powders/Naproxen/BC powders/Meloxicam/Diclofenac/Indomethacin and other Nonsteroidal anti-inflammatory medications as these will make you more likely to bleed and can cause stomach ulcers, can also cause Kidney problems.   Increase activity slowly   Complete by: As directed          Discharge Medications     Allergies as of 04/25/2022       Reactions   Peanut-containing Drug Products Anaphylaxis, Hives   Patient states she is allergic to all nuts   Shellfish Allergy Anaphylaxis   Gluten Meal    Latex Other (See Comments)   Reaction: blistering   Prednisone Hives, Other (See Comments)   Reaction: causes psychological issues    Nexium [esomeprazole Magnesium] Hives   Tape Rash   Paper tape please        Medication List     TAKE these medications    Accu-Chek Guide Me w/Device Kit USE TO CHECK BLOOD SUGAR DAILY AS DIRECTED   Accu-Chek Guide test strip Generic drug: glucose blood USE TO CHECK BLOOD SUGAR 4 TIMES DAILY AS DIRECTED   Accu-Chek Softclix Lancets lancets USE TO CHECK BLOOD SUGAR 4 TIMES DAILY AS DIRECTED   albuterol 108 (90 Base) MCG/ACT inhaler Commonly known as: VENTOLIN HFA Inhale 1-2 puffs into the lungs every 6 (six) hours as needed for wheezing or shortness of breath.   albuterol 0.63 MG/3ML  nebulizer solution Commonly known as: ACCUNEB Take 1 ampule by nebulization every 6 (six) hours as needed for wheezing or shortness of breath.  B-D ULTRAFINE III SHORT PEN 31G X 8 MM Misc Generic drug: Insulin Pen Needle USE TO INJECT INSULIN 2 TIMES DAILY   baclofen 20 MG tablet Commonly known as: LIORESAL Take 20 mg by mouth 3 (three) times daily.   blood glucose meter kit and supplies Kit Dispense based on patient and insurance preference. Use up to four times daily as directed. (FOR ICD-9 250.00, 250.01).   citalopram 40 MG tablet Commonly known as: CELEXA Take 40 mg by mouth every evening.   clopidogrel 75 MG tablet Commonly known as: PLAVIX Take 75 mg by mouth daily.   Dexcom G7 Receiver Devi Use to check glucose as directed   Dexcom G7 Sensor Misc Change sensor every 10 days   gabapentin 800 MG tablet Commonly known as: NEURONTIN Take 800 mg by mouth 4 (four) times daily.   lisinopril 5 MG tablet Commonly known as: ZESTRIL Take 1 tablet (5 mg total) by mouth daily.   metFORMIN 1000 MG tablet Commonly known as: GLUCOPHAGE Take 1 tablet (1,000 mg total) by mouth 2 (two) times daily with a meal. What changed:  medication strength when to take this   metoprolol succinate 50 MG 24 hr tablet Commonly known as: TOPROL-XL Take 1 tablet (50 mg total) by mouth daily. Take with or immediately following a meal.   NovoLIN 70/30 Kwikpen (70-30) 100 UNIT/ML KwikPen Generic drug: insulin isophane & regular human KwikPen Inject 10 Units into the skin 2 (two) times daily before a meal.   ondansetron 4 MG tablet Commonly known as: ZOFRAN Take 1 tablet (4 mg total) by mouth 4 (four) times daily as needed for nausea or vomiting.   oxycodone 30 MG immediate release tablet Commonly known as: ROXICODONE Take 15 mg by mouth every 6 (six) hours as needed for pain.   pantoprazole 40 MG tablet Commonly known as: Protonix Take 1 tablet (40 mg total) by mouth 2 (two) times  daily.   pravastatin 40 MG tablet Commonly known as: PRAVACHOL Take 1 tablet (40 mg total) by mouth every evening.   rOPINIRole 0.5 MG tablet Commonly known as: REQUIP Take 0.5 mg by mouth at bedtime.   sucralfate 1 g tablet Commonly known as: Carafate Take 1 tablet (1 g total) by mouth 4 (four) times daily.        Major procedures and Radiology Reports - PLEASE review detailed and final reports for all details, in brief -   ***  CT CHEST ABDOMEN PELVIS WO CONTRAST  Result Date: 04/20/2022 CLINICAL DATA:  Sepsis. EXAM: CT CHEST, ABDOMEN AND PELVIS WITHOUT CONTRAST TECHNIQUE: Multidetector CT imaging of the chest, abdomen and pelvis was performed following the standard protocol without IV contrast. RADIATION DOSE REDUCTION: This exam was performed according to the departmental dose-optimization program which includes automated exposure control, adjustment of the mA and/or kV according to patient size and/or use of iterative reconstruction technique. COMPARISON:  CT abdomen pelvis dated 11/28/2021. FINDINGS: Evaluation of this exam is limited in the absence of intravenous contrast. CT CHEST FINDINGS Cardiovascular: There is no cardiomegaly or pericardial effusion. Mild atherosclerotic calcification of the thoracic aorta. No aneurysmal dilatation. The central pulmonary arteries are grossly unremarkable. Mediastinum/Nodes: No hilar or mediastinal adenopathy. The esophagus is grossly unremarkable. No mediastinal fluid collection. Lungs/Pleura: Bibasilar subpleural atelectasis/scarring. There is a 5 mm right middle lobe nodule (79/3) as well as a 5 mm subpleural nodule in the right lower lobe (66/3). No focal consolidation, pleural effusion, or pneumothorax. The central airways are patent. Musculoskeletal: Partially  visualized lower cervical ACDF. No acute osseous pathology. Thoracic spine stimulator. CT ABDOMEN PELVIS FINDINGS No intra-abdominal free air or free fluid. Hepatobiliary: Morphologic  changes of cirrhosis. No biliary dilatation. Cholecystectomy. No retained calcified stone noted in the central CBD. Pancreas: Fatty infiltration of the pancreas. Mild infiltration of the peripancreatic fat, likely related to diffuse mesenteric infiltration and cirrhosis. Correlation with pancreatic enzymes recommended to exclude acute pancreatitis. Spleen: Normal in size without focal abnormality. Adrenals/Urinary Tract: The adrenal glands are unremarkable. There is no hydronephrosis or nephrolithiasis on either side. The visualized ureters and urinary bladder appear unremarkable. Stomach/Bowel: There is scattered sigmoid diverticula without active inflammatory changes. Mild diffuse pericolonic stranding, likely related to hepatic colopathy. Colitis is less likely but not excluded clinical correlation is recommended. There is no bowel obstruction. Appendectomy. Vascular/Lymphatic: Mild aortoiliac atherosclerotic disease. The IVC is unremarkable. No portal venous gas. Probable recanalization of the umbilical vein. Indeterminate 7 mm nodular density in the omentum (65/2), likely a lymph node. No adenopathy. Reproductive: Hysterectomy.  No adnexal masses. Other: None Musculoskeletal: No acute or significant osseous findings. IMPRESSION: 1. No acute intrathoracic pathology. 2. Cirrhosis with evidence of portal hypertension. 3. Mild diffuse pericolonic stranding, likely related to hepatic colopathy. Colitis is less likely but not excluded clinical correlation is recommended. No bowel obstruction. 4. Sigmoid diverticulosis. 5. Small pulmonary nodules measuring up to 5 mm. No follow-up needed if patient is low-risk (and has no known or suspected primary neoplasm). Non-contrast chest CT can be considered in 12 months if patient is high-risk. This recommendation follows the consensus statement: Guidelines for Management of Incidental Pulmonary Nodules Detected on CT Images: From the Fleischner Society 2017; Radiology 2017;  284:228-243. 6.  Aortic Atherosclerosis (ICD10-I70.0). Electronically Signed   By: Anner Crete M.D.   On: 04/20/2022 00:17   DG Chest Port 1 View  Result Date: 04/19/2022 CLINICAL DATA:  Possible sepsis. EXAM: PORTABLE CHEST 1 VIEW COMPARISON:  11/28/2021. FINDINGS: The heart is enlarged and the mediastinal contour is within normal limits. Lung volumes are low and there is chronic elevation of the right diaphragm. No consolidation, effusion, or pneumothorax. Neurostimulator leads are present over the midline. Cervical spinal fusion hardware is noted. IMPRESSION: No active disease. Electronically Signed   By: Brett Fairy M.D.   On: 04/19/2022 21:49    Micro Results   *** Recent Results (from the past 240 hour(s))  Blood Culture (routine x 2)     Status: None   Collection Time: 04/19/22  9:13 PM   Specimen: Right Antecubital; Blood  Result Value Ref Range Status   Specimen Description   Final    RIGHT ANTECUBITAL BOTTLES DRAWN AEROBIC AND ANAEROBIC   Special Requests Blood Culture adequate volume  Final   Culture   Final    NO GROWTH 5 DAYS Performed at [facility], [address], [place], [place] [address]    Report Status 04/24/2022 FINAL  Final  Resp panel by RT-PCR (RSV, Flu A&B, Covid) Anterior Nasal Swab     Status: None   Collection Time: 04/19/22  9:23 PM   Specimen: Anterior Nasal Swab  Result Value Ref Range Status   SARS Coronavirus 2 by RT PCR NEGATIVE NEGATIVE Final    Comment: (NOTE) SARS-CoV-2 target nucleic acids are NOT DETECTED.  The SARS-CoV-2 RNA is generally detectable in upper respiratory specimens during the acute phase of infection. The lowest concentration of SARS-CoV-2 viral copies this assay can detect is 138 copies/mL. A negative result does not preclude SARS-Cov-2  infection and should not be used as the sole basis for treatment or other patient management decisions. A negative result may occur with  improper specimen collection/handling,  submission of specimen other than nasopharyngeal swab, presence of viral mutation(s) within the areas targeted by this assay, and inadequate number of viral copies(<138 copies/mL). A negative result must be combined with clinical observations, patient history, and epidemiological information. The expected result is Negative.  Fact Sheet for Patients:  BloggerCourse.comhttps://www.fda.gov/media/152166/download  Fact Sheet for Healthcare Providers:  SeriousBroker.ithttps://www.fda.gov/media/152162/download  This test is no t yet approved or cleared by the [place] FDA and  has been authorized for detection and/or diagnosis of SARS-CoV-2 by FDA under an Emergency Use Authorization (EUA). This EUA will remain  in effect (meaning this test can be used) for the duration of the COVID-19 declaration under Section 564(b)(1) of the Act, 21 U.S.C.section 360bbb-3(b)(1), unless the authorization is terminated  or revoked sooner.       Influenza A by PCR NEGATIVE NEGATIVE Final   Influenza B by PCR NEGATIVE NEGATIVE Final    Comment: (NOTE) The Xpert Xpress SARS-CoV-2/FLU/RSV plus assay is intended as an aid in the diagnosis of influenza from Nasopharyngeal swab specimens and should not be used as a sole basis for treatment. Nasal washings and aspirates are unacceptable for Xpert Xpress SARS-CoV-2/FLU/RSV testing.  Fact Sheet for Patients: BloggerCourse.comhttps://www.fda.gov/media/152166/download  Fact Sheet for Healthcare Providers: SeriousBroker.ithttps://www.fda.gov/media/152162/download  This test is not yet approved or cleared by the [place] FDA and has been authorized for detection and/or diagnosis of SARS-CoV-2 by FDA under an Emergency Use Authorization (EUA). This EUA will remain in effect (meaning this test can be used) for the duration of the COVID-19 declaration under Section 564(b)(1) of the Act, 21 U.S.C. section 360bbb-3(b)(1), unless the authorization is terminated or revoked.     Resp Syncytial Virus by PCR NEGATIVE  NEGATIVE Final    Comment: (NOTE) Fact Sheet for Patients: BloggerCourse.comhttps://www.fda.gov/media/152166/download  Fact Sheet for Healthcare Providers: SeriousBroker.ithttps://www.fda.gov/media/152162/download  This test is not yet approved or cleared by the [place] FDA and has been authorized for detection and/or diagnosis of SARS-CoV-2 by FDA under an Emergency Use Authorization (EUA). This EUA will remain in effect (meaning this test can be used) for the duration of the COVID-19 declaration under Section 564(b)(1) of the Act, 21 U.S.C. section 360bbb-3(b)(1), unless the authorization is terminated or revoked.  Performed at [facility], [address] [place], [place] [address]   Blood Culture (routine x 2)     Status: None   Collection Time: 04/19/22  9:56 PM   Specimen: Site Not Specified; Blood  Result Value Ref Range Status   Specimen Description   Final    SITE NOT SPECIFIED BOTTLES DRAWN AEROBIC AND ANAEROBIC   Special Requests Blood Culture adequate volume  Final   Culture   Final    NO GROWTH 5 DAYS Performed at [facility], [address] [place], [place] [address]    Report Status 04/24/2022 FINAL  Final  Urine Culture     Status: None   Collection Time: 04/19/22 11:59 PM   Specimen: In/Out Cath Urine  Result Value Ref Range Status   Specimen Description   Final    IN/OUT CATH URINE Performed at [facility], [address] [place], [place] [address]    Special Requests   Final    NONE Performed at [facility], [address] [place], [place] [address]    Culture   Final    NO  GROWTH Performed at [facility], [address] [place], [place] [address]    Report Status 04/21/2022 FINAL  Final  MRSA Next Gen by PCR, Nasal     Status: None   Collection Time: 04/22/22  5:00 AM   Specimen: Nasal Mucosa; Nasal Swab  Result Value Ref Range Status   MRSA by PCR Next Gen NOT DETECTED NOT DETECTED Final    Comment: (NOTE) The GeneXpert MRSA Assay (FDA approved for NASAL  specimens only), is one component of a comprehensive MRSA colonization surveillance program. It is not intended to diagnose MRSA infection nor to guide or monitor treatment for MRSA infections. Test performance is not FDA approved in patients less than 73 years old. Performed at [facility], [address], [place], [place] [address]     Today   Subjective    [name] today has no ***          Patient has been seen and examined prior to discharge   Objective   Blood pressure (!) 148/70, pulse 73, temperature 98.1 F (36.7 C), resp. rate 18, height 5\' 5"  (1.651 m), weight 83.9 kg, SpO2 99 %.   Intake/Output Summary (Last 24 hours) at 04/25/2022 1502 Last data filed at 04/25/2022 1336 Gross per 24 hour  Intake 960 ml  Output --  Net 960 ml    Exam Gen:- Awake Alert, no acute distress *** HEENT:- [place].AT, No sclera icterus Neck-Supple Neck,No JVD,.  Lungs-  CTAB , good air movement bilaterally CV- S1, S2 normal, regular Abd-  +ve B.Sounds, Abd Soft, No tenderness,    Extremity/Skin:- No  edema,   good pulses Psych-affect is appropriate, oriented x3 Neuro-no new focal deficits, no tremors ***   Data Review   CBC w Diff:  Lab Results  Component Value Date   WBC 3.5 (L) 04/25/2022   HGB 8.2 (L) 04/25/2022   HCT 26.8 (L) 04/25/2022   PLT 152 04/25/2022   LYMPHOPCT 9 04/20/2022   MONOPCT 6 04/20/2022   EOSPCT 1 04/20/2022   BASOPCT 0 04/20/2022    CMP:  Lab Results  Component Value Date   NA 137 04/25/2022   NA 141 01/15/2022   K 3.8 04/25/2022   CL 105 04/25/2022   CO2 26 04/25/2022   BUN 11 04/25/2022   BUN 12 01/15/2022   CREATININE 0.64 04/25/2022   CREATININE 0.69 11/01/2018   PROT 6.4 (L) 04/20/2022   PROT 7.4 01/15/2022   ALBUMIN 2.4 (L) 04/20/2022   ALBUMIN 3.3 (L) 01/15/2022   BILITOT 0.3 04/20/2022   BILITOT <0.2 01/15/2022   ALKPHOS 98 04/20/2022   AST 25 04/20/2022   ALT 17 04/20/2022  .  Total Discharge time is about 33  minutes  04/22/2022 M.D on 04/25/2022 at 3:02 PM  Go to [email or web address] -  for contact info  [facility] - Office  [phone]

## 2022-04-26 LAB — TYPE AND SCREEN
ABO/RH(D): O POS
Antibody Screen: NEGATIVE
Unit division: 0

## 2022-04-26 LAB — BPAM RBC
Blood Product Expiration Date: 202403052359
ISSUE DATE / TIME: 202401261115
Unit Type and Rh: 5100

## 2022-04-27 ENCOUNTER — Other Ambulatory Visit: Payer: Self-pay | Admitting: *Deleted

## 2022-04-27 DIAGNOSIS — D649 Anemia, unspecified: Secondary | ICD-10-CM

## 2022-04-27 NOTE — Telephone Encounter (Signed)
Spoke to pt, informed her to have labs completed next week and reminded her of OV 2/20. Labs entered into Epic.

## 2022-04-29 ENCOUNTER — Encounter (HOSPITAL_COMMUNITY): Payer: Self-pay | Admitting: Gastroenterology

## 2022-04-30 LAB — SURGICAL PATHOLOGY

## 2022-05-06 LAB — CBC WITH DIFFERENTIAL/PLATELET
Basophils Absolute: 0.1 10*3/uL (ref 0.0–0.2)
Basos: 2 %
EOS (ABSOLUTE): 0.3 10*3/uL (ref 0.0–0.4)
Eos: 6 %
Hematocrit: 29.5 % — ABNORMAL LOW (ref 34.0–46.6)
Hemoglobin: 8.8 g/dL — ABNORMAL LOW (ref 11.1–15.9)
Immature Grans (Abs): 0 10*3/uL (ref 0.0–0.1)
Immature Granulocytes: 0 %
Lymphocytes Absolute: 1.4 10*3/uL (ref 0.7–3.1)
Lymphs: 33 %
MCH: 22.4 pg — ABNORMAL LOW (ref 26.6–33.0)
MCHC: 29.8 g/dL — ABNORMAL LOW (ref 31.5–35.7)
MCV: 75 fL — ABNORMAL LOW (ref 79–97)
Monocytes Absolute: 0.3 10*3/uL (ref 0.1–0.9)
Monocytes: 8 %
Neutrophils Absolute: 2.2 10*3/uL (ref 1.4–7.0)
Neutrophils: 51 %
Platelets: 186 10*3/uL (ref 150–450)
RBC: 3.93 x10E6/uL (ref 3.77–5.28)
RDW: 15.2 % (ref 11.7–15.4)
WBC: 4.3 10*3/uL (ref 3.4–10.8)

## 2022-05-15 ENCOUNTER — Ambulatory Visit: Payer: BC Managed Care – PPO | Attending: Medical | Admitting: Medical

## 2022-05-15 ENCOUNTER — Encounter: Payer: Self-pay | Admitting: Medical

## 2022-05-15 VITALS — BP 122/66 | HR 82 | Ht 65.0 in | Wt 188.0 lb

## 2022-05-15 DIAGNOSIS — R002 Palpitations: Secondary | ICD-10-CM | POA: Diagnosis not present

## 2022-05-15 DIAGNOSIS — Z8673 Personal history of transient ischemic attack (TIA), and cerebral infarction without residual deficits: Secondary | ICD-10-CM

## 2022-05-15 DIAGNOSIS — D62 Acute posthemorrhagic anemia: Secondary | ICD-10-CM

## 2022-05-15 DIAGNOSIS — I10 Essential (primary) hypertension: Secondary | ICD-10-CM | POA: Diagnosis not present

## 2022-05-15 DIAGNOSIS — E119 Type 2 diabetes mellitus without complications: Secondary | ICD-10-CM

## 2022-05-15 DIAGNOSIS — E782 Mixed hyperlipidemia: Secondary | ICD-10-CM | POA: Diagnosis not present

## 2022-05-15 DIAGNOSIS — K922 Gastrointestinal hemorrhage, unspecified: Secondary | ICD-10-CM

## 2022-05-15 DIAGNOSIS — K529 Noninfective gastroenteritis and colitis, unspecified: Secondary | ICD-10-CM

## 2022-05-15 DIAGNOSIS — Z794 Long term (current) use of insulin: Secondary | ICD-10-CM

## 2022-05-15 NOTE — Progress Notes (Signed)
Cardiology Office Note:    Date:  05/15/2022   ID:  [name], DOB June 28, 1965, MRN [id number]  PCP:  Redmond School, MD  [facility] Cardiologist:  Carlyle Dolly, MD  [facility] Electrophysiologist:  None   Referring MD: Redmond School, MD   Chief Complaint: 1 month follow-up  History of Present Illness:    [name] is a 57 y.o. female with a hx of HTN, HLD, DM2, palpitations, CVA, OSA, multiple sclerosis who presents for 1 month follow-up.   Echo 02/2019 showed LVEF 60-65%, mildly increaed LVH, G1DD, no WMA, no valvular abnormalities. Cartid duplex 05/2019 showed carotid intimal thickening without significant atherosclerosis. No hemodynamically significant ICA stenosis. Degree is less than 50% bl by [place].   She was last seen 04/09/22 reporting increase in palpitations. She has been out of Toprol for about 6 months.   The patient was recently admitted for sepsis from colitis. Colonoscopy showed hemorrhoids and diverticulitis. Hospital course was complicated by AKI and positive fecal occult blood, acute blood loss anemia, agute GI bleed gfrom gastric ulcers and gastritis and duodenitis. EGD showed nonbleeding gastric ulcers in the gastric body and gastric antrum with a clea ulcer, and gastritis and duodenal erosion. Hgb dropped to 6.8 and she was transferred 1 unit PRBCs. Heparin and Plavix were initially held. GI plan to repeat EGD in 3 months. She was discharged on Carafate and Protonix.   Saw PCP and still had UTI and was given abx.   Today, the patient reports she is doing better with the Toprol. She denies any palpitations. No chest pain or shortness of breath. No lower leg edema, orthopnea, pnd. She uses CPAP at night.   Past Medical History:  Diagnosis Date   Asthma    Chronic abdominal pain    Chronic neck pain    C4-6 fusion   CONSTIPATION 05/16/2009   Qualifier: Diagnosis of  By: Ronnald Ramp FNP-BC, Kandice L    Diabetes mellitus without complication ([place])     Dysphagia 02/23/2012   Fatty liver    GERD (gastroesophageal reflux disease)    Helicobacter pylori gastritis 2011   Hyperlipidemia    Hypertension    LIVER FUNCTION TESTS, ABNORMAL, HX OF 05/14/2009   Qualifier: Diagnosis of  By: Olena Heckle, [place]     Migraine headache    MIGRAINE, COMMON 05/14/2009   Qualifier: Diagnosis of  By: Stephan Minister     MS (multiple sclerosis) [facility])    Sleep apnea     Past Surgical History:  Procedure Laterality Date   ABDOMINAL HYSTERECTOMY     APPENDECTOMY     BACK SURGERY  03/07/2012   Nerve Stimulator   BIOPSY  04/23/2022   Procedure: BIOPSY;  Surgeon: Harvel Quale, MD;  [facility];  Service: Gastroenterology;;   CHOLECYSTECTOMY     COLONOSCOPY  07/09/2009   RMR; normal rectum aside from anal canal hemorrhoids/scattered left-sided diverticula   COLONOSCOPY WITH PROPOFOL N/A 04/23/2022   Procedure: COLONOSCOPY WITH PROPOFOL;  Surgeon: Harvel Quale, MD;  Location: [facility];  Service: Gastroenterology;  Laterality: N/A;   ESOPHAGOGASTRODUODENOSCOPY  05/22/2009   RMR; normal /small HH   ESOPHAGOGASTRODUODENOSCOPY  2007   RMR: non-critical Schatzki's rin, non-maniuplated   ESOPHAGOGASTRODUODENOSCOPY N/A 10/24/2013   Procedure: ESOPHAGOGASTRODUODENOSCOPY (EGD);  Surgeon: Daneil Dolin, MD;  Location: [facility];  Service: Endoscopy;  Laterality: N/A;  9:45   ESOPHAGOGASTRODUODENOSCOPY N/A 04/23/2022   Procedure: ESOPHAGOGASTRODUODENOSCOPY (EGD);  Surgeon: Montez Morita, Quillian Quince, MD;  [facility];  Service: Gastroenterology;  Laterality: N/A;   ESOPHAGOGASTRODUODENOSCOPY (EGD) WITH ESOPHAGEAL DILATION  03/17/2012   [id number] appearing esophageal mucosa of uncertain significance. Status post Venia Minks dilation followed by esophageal bx   MALONEY DILATION N/A 10/24/2013   Procedure: Venia Minks DILATION;  Surgeon: Daneil Dolin, MD;  [facility];  Service: Endoscopy;  Laterality: N/A;    MANDIBLE SURGERY     NECK SURGERY     X2, last time 2013    Current Medications: Current Meds  Medication Sig   ACCU-CHEK GUIDE test strip USE TO CHECK BLOOD SUGAR 4 TIMES DAILY AS DIRECTED   Accu-Chek Softclix Lancets lancets USE TO CHECK BLOOD SUGAR 4 TIMES DAILY AS DIRECTED   albuterol (ACCUNEB) 0.63 MG/3ML nebulizer solution Take 1 ampule by nebulization every 6 (six) hours as needed for wheezing or shortness of breath.   albuterol (VENTOLIN HFA) 108 (90 Base) MCG/ACT inhaler Inhale 1-2 puffs into the lungs every 6 (six) hours as needed for wheezing or shortness of breath.    baclofen (LIORESAL) 20 MG tablet Take 20 mg by mouth 3 (three) times daily.    blood glucose meter kit and supplies KIT Dispense based on patient and insurance preference. Use up to four times daily as directed. (FOR ICD-9 250.00, 250.01).   Blood Glucose Monitoring Suppl (ACCU-CHEK GUIDE ME) w/Device KIT USE TO CHECK BLOOD SUGAR DAILY AS DIRECTED   citalopram (CELEXA) 40 MG tablet Take 40 mg by mouth every evening.    clopidogrel (PLAVIX) 75 MG tablet Take 75 mg by mouth daily.   Continuous Blood Gluc Receiver (DEXCOM G7 RECEIVER) DEVI Use to check glucose as directed   Continuous Blood Gluc Sensor (DEXCOM G7 SENSOR) MISC Change sensor every 10 days   gabapentin (NEURONTIN) 800 MG tablet Take 800 mg by mouth 4 (four) times daily.   insulin isophane & regular human KwikPen (NOVOLIN 70/30 KWIKPEN) (70-30) 100 UNIT/ML KwikPen Inject 10 Units into the skin 2 (two) times daily before a meal.   Insulin Pen Needle (B-D ULTRAFINE III SHORT PEN) 31G X 8 MM MISC USE TO INJECT INSULIN 2 TIMES DAILY   lisinopril (ZESTRIL) 5 MG tablet Take 1 tablet (5 mg total) by mouth daily.   metFORMIN (GLUCOPHAGE) 1000 MG tablet Take 1 tablet (1,000 mg total) by mouth 2 (two) times daily with a meal.   metoprolol succinate (TOPROL-XL) 50 MG 24 hr tablet Take 1 tablet (50 mg total) by mouth daily. Take with or immediately following a meal.    ondansetron (ZOFRAN) 4 MG tablet Take 1 tablet (4 mg total) by mouth 4 (four) times daily as needed for nausea or vomiting.   oxycodone (ROXICODONE) 30 MG immediate release tablet Take 15 mg by mouth every 6 (six) hours as needed for pain.   pantoprazole (PROTONIX) 40 MG tablet Take 1 tablet (40 mg total) by mouth 2 (two) times daily.   pravastatin (PRAVACHOL) 40 MG tablet Take 1 tablet (40 mg total) by mouth every evening.   rOPINIRole (REQUIP) 0.5 MG tablet Take 0.5 mg by mouth at bedtime.   sucralfate (CARAFATE) 1 g tablet Take 1 tablet (1 g total) by mouth 4 (four) times daily.     Allergies:   Peanut-containing drug products, Shellfish allergy, Gluten meal, Latex, Prednisone, Nexium [esomeprazole magnesium], and Tape   Social History   Socioeconomic History   Marital status: Married    Spouse name: Not on file   Number of children: Not on file  Years of education: Not on file   Highest education level: Not on file  Occupational History   Not on file  Tobacco Use   Smoking status: Never   Smokeless tobacco: Never  Vaping Use   Vaping Use: Never used  Substance and Sexual Activity   Alcohol use: Not Currently   Drug use: No   Sexual activity: Yes    Birth control/protection: Surgical  Other Topics Concern   Not on file  Social History Narrative   5 kids, raising grandchild   Social Determinants of Health   Financial Resource Strain: Not on file  Food Insecurity: Food Insecurity Present (04/20/2022)   Hunger Vital Sign    Worried About Running Out of Food in the Last Year: Often true    Ran Out of Food in the Last Year: Sometimes true  Transportation Needs: No Transportation Needs (04/20/2022)   PRAPARE - Hydrologist (Medical): No    Lack of Transportation (Non-Medical): No  Physical Activity: Not on file  Stress: Not on file  Social Connections: Not on file     Family History: The patient's family history includes Cancer in her  mother; Diabetes in her father and sister. There is no history of Colon cancer.  ROS:   Please see the history of present illness.     All other systems reviewed and are negative.  EKGs/Labs/Other Studies Reviewed:    The following studies were reviewed today:  Echo 2020  1. Left ventricular ejection fraction, by visual estimation, is 60 to  65%. The left ventricle has normal function. There is mildly increased  left ventricular hypertrophy.   2. Left ventricular diastolic parameters are consistent with Grade I  diastolic dysfunction (impaired relaxation).   3. The left ventricle has no regional wall motion abnormalities.   4. Global right ventricle has normal systolic function.The right  ventricular size is normal. No increase in right ventricular wall  thickness.   5. Left atrial size was normal.   6. Right atrial size was normal.   7. The mitral valve is normal in structure. No evidence of mitral valve  regurgitation. No evidence of mitral stenosis.   8. The tricuspid valve is normal in structure. Tricuspid valve  regurgitation is not demonstrated.   9. The aortic valve is tricuspid. Aortic valve regurgitation is not  visualized. No evidence of aortic valve sclerosis or stenosis.  10. The pulmonic valve was not well visualized. Pulmonic valve  regurgitation is not visualized.  11. The inferior vena cava is normal in size with greater than 50%  respiratory variability, suggesting right atrial pressure of 3 mmHg.    Heart monitor 10/2018 7 day holter monitor Min HR 63, Max HR 145, Avg HR 99 Rare ventricular ectopy, isolated PVC Rare supraventricualr ectopy in the form of isolated PACs and couplets No diary submitted No significant arrhythmias  EKG:  EKG is not ordered today.   Recent Labs: 01/15/2022: TSH 0.306 04/20/2022: ALT 17; Magnesium 1.9 04/25/2022: BUN 11; Creatinine, Ser 0.64; Potassium 3.8; Sodium 137 05/05/2022: Hemoglobin 8.8; Platelets 186  Recent Lipid  Panel    Component Value Date/Time   CHOL 121 01/15/2022 1330   TRIG 93 01/15/2022 1330   HDL 39 (L) 01/15/2022 1330   CHOLHDL 3.1 01/15/2022 1330   CHOLHDL 3.3 03/12/2019 0432   VLDL 21 03/12/2019 0432   LDLCALC 64 01/15/2022 1330   LDLCALC 116 (H) 04/28/2017 1101     Physical Exam:  VS:  BP 122/66   Pulse 82   Ht 5' 5"$  (1.651 m)   Wt 188 lb (85.3 kg)   SpO2 96%   BMI 31.28 kg/m     Wt Readings from Last 3 Encounters:  05/15/22 188 lb (85.3 kg)  04/23/22 185 lb (83.9 kg)  04/09/22 191 lb 9.6 oz (86.9 kg)     GEN:  Well nourished, well developed in no acute distress HEENT: Normal NECK: No JVD; No carotid bruits LYMPHATICS: No lymphadenopathy CARDIAC: RRR, no murmurs, rubs, gallops RESPIRATORY:  Clear to auscultation without rales, wheezing or rhonchi  ABDOMEN: Soft, non-tender, non-distended MUSCULOSKELETAL:  No edema; No deformity  SKIN: Warm and dry NEUROLOGIC:  Alert and oriented x 3 PSYCHIATRIC:  Normal affect   ASSESSMENT:    1. Palpitations   2. Essential hypertension   3. Hyperlipidemia, mixed   4. History of CVA (cerebrovascular accident)   5. Type 2 diabetes mellitus without complication, with long-term current use of insulin ([place])   6. Acute blood loss anemia   7. Gastrointestinal hemorrhage, unspecified gastrointestinal hemorrhage type   8. Colitis    PLAN:    In order of problems listed above:  Palpitations Patient reports improved palpitations on Toprol. No further episodes. Continue Toprol 78m daily. No further work-up.   Acute Blood loss anemia/GIB Gastric ulcers, gastritis, duodenitis Sepsis from colitis Recent admission with sepsis from colitis treated rocephin and flagyl. Colonoscopy showedhemorrhoids and diverticulosis otherwise no significant acute findings. EGD showed nonbleeding gastric ulcers in the gastric body and gastric antrum with a clean ulcer base [place] class III as well as gastritis and duodenal erosion. Hgb dropped to  6.8 and she was transfused 1 unit PRBCs. Heparin and plavix were temporarily held. GI plans repeat EGD in 3 months. Follow-up Hgb 8.8. she is taking Plavix. The patient is on Carafate and Protonix.   HTN BP is good today. Continue Lisinopril and Toprol.   HLD LDL 64. Continue Pravastatin  CVA Continue Plavix and statin therapy.   DM2 A1C 7 09/2021. Management per endocrinology.   Disposition: Follow up in 6 month(s) with Md/APP     Signed, Alverda Nazzaro HNinfa Meeker PA-C  05/15/2022 1:59 PM    [facility]

## 2022-05-15 NOTE — Patient Instructions (Signed)
Medication Instructions:  Your physician recommends that you continue on your current medications as directed. Please refer to the Current Medication list given to you today.   Labwork: None today  Testing/Procedures: None today  Follow-Up: 6 months  Any Other Special Instructions Will Be Listed Below (If Applicable).  If you need a refill on your cardiac medications before your next appointment, please call your pharmacy.  

## 2022-05-19 ENCOUNTER — Ambulatory Visit (INDEPENDENT_AMBULATORY_CARE_PROVIDER_SITE_OTHER): Payer: BC Managed Care – PPO | Admitting: Gastroenterology

## 2022-05-19 ENCOUNTER — Encounter: Payer: Self-pay | Admitting: Gastroenterology

## 2022-05-19 VITALS — BP 117/67 | HR 84 | Temp 97.8°F | Ht 65.0 in | Wt 188.3 lb

## 2022-05-19 DIAGNOSIS — D649 Anemia, unspecified: Secondary | ICD-10-CM

## 2022-05-19 DIAGNOSIS — K7460 Unspecified cirrhosis of liver: Secondary | ICD-10-CM

## 2022-05-19 DIAGNOSIS — K279 Peptic ulcer, site unspecified, unspecified as acute or chronic, without hemorrhage or perforation: Secondary | ICD-10-CM | POA: Diagnosis not present

## 2022-05-19 NOTE — Progress Notes (Unsigned)
GI Office Note    Referring Provider: Redmond School, MD Primary Care Physician:  Redmond School, MD  Primary Gastroenterologist: Cristopher Estimable.Rourk, MD]   Chief Complaint   Chief Complaint  Patient presents with   Follow-up    Patient here today for a hospital follow up due to sepsis on 04/19/2022, that patient says was due to a uti. She also had a ct scan which showed several things per patient including cirrhosis. Patient also was very anemic had a hgb of 6.6 at the time of hospitalization. She had repeat hgb on 05/05/2022 at which time hgb was 8.8. patient denies any symptomatic anemia, no sob, no dizziness, no fatigue nor chest pain.      History of Present Illness   [name] is a 57 y.o. female presenting today at the request of Redmond School, MD for hospital follow up.   Seen at [facility] in January by our GI service for anemia.  Hemoglobin 9.2 on admission with decreased to 7.2.  Was given 1 unit of blood in preparation for EGD and colonoscopy which she underwent on 04/23/2022, as outlined below.  Continue to have fatigue while inpatient but was able to tolerate diet without issue.  She had evidence of cirrhosis and portal hypertension on CT A/P as outlined below.  LFTs within normal limits.  MELD sodium 7.  Brief education given while inpatient and advised that we discuss further outpatient and perform additional workup as needed.  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  Evidence of cholecystectomy -Sigmoid diverticulosis -Small pulmonary nodules measuring up to 5 mm -Diffuse pericolonic stranding likely related to hepatic colopathy, colitis less likely but not excluded.  EGD 04/23/22: -2 cm hiatal hernia -Nonbleeding gastric ulcer with  clean ulcer base s/p biopsy -Gastritis -Duodenal erosion  Colonoscopy 04/23/22: -Hemorrhoids on perianal exam -Sigmoid and descending diverticulosis -Normal rectum  Labs 05/05/2022: Hemoglobin 8.8, MCV 75, platelets 186  Today: Still having issues with her stomach. Having nausea and lack of appetite related stomach pains to about the same but slightly better than she was doing. No vomiting. Has been having trouble getting food down and has to chug liquids to get it to go down good.Males sure she chews food adequately.  Blood sugars have been "okay". Ranging 100-170 on average.   BM every couple days. No melena or brbpr.  Energy level is good some days and others it isnt. No confusion. No swelling to lower extremities or abdomen. But does report some bloating. Passing lots of gas. No jaundice. No pruritus.  Is eating meats, pintos and greens as part of her diet. Trying to do grill and baked. Used to fry foods a lot.   No chest pain, shortness of breath. Not able to tolerate chicken and [place].    Current Outpatient Medications  Medication Sig Dispense Refill   ACCU-CHEK GUIDE test strip USE TO CHECK BLOOD SUGAR 4 TIMES DAILY AS DIRECTED 100 strip 0   Accu-Chek Softclix Lancets lancets USE TO CHECK BLOOD SUGAR 4 TIMES DAILY AS DIRECTED 100 each 0   albuterol (ACCUNEB) 0.63 MG/3ML nebulizer solution Take 1 ampule by nebulization every 6 (six) hours as needed for wheezing or shortness of breath.     albuterol (VENTOLIN HFA) 108 (90 Base) MCG/ACT inhaler Inhale 1-2  puffs into the lungs every 6 (six) hours as needed for wheezing or shortness of breath.      baclofen (LIORESAL) 20 MG tablet Take 20 mg by mouth 3 (three) times daily.   3   blood glucose meter kit and supplies KIT Dispense based on patient and insurance preference. Use up to four times daily as directed. (FOR ICD-9 250.00, 250.01). 1 each 0   Blood Glucose Monitoring Suppl (ACCU-CHEK GUIDE ME) w/Device KIT USE TO CHECK BLOOD  SUGAR DAILY AS DIRECTED 1 kit 0   citalopram (CELEXA) 40 MG tablet Take 40 mg by mouth every evening.      clopidogrel (PLAVIX) 75 MG tablet Take 75 mg by mouth daily.     Continuous Blood Gluc Receiver (DEXCOM G7 RECEIVER) DEVI Use to check glucose as directed 1 each 0   Continuous Blood Gluc Sensor (DEXCOM G7 SENSOR) MISC Change sensor every 10 days 3 each 2   gabapentin (NEURONTIN) 800 MG tablet Take 800 mg by mouth 4 (four) times daily.     insulin isophane & regular human KwikPen (NOVOLIN 70/30 KWIKPEN) (70-30) 100 UNIT/ML KwikPen Inject 10 Units into the skin 2 (two) times daily before a meal. 45 mL 2   Insulin Pen Needle (B-D ULTRAFINE III SHORT PEN) 31G X 8 MM MISC USE TO INJECT INSULIN 2 TIMES DAILY 100 each 5   lisinopril (ZESTRIL) 5 MG tablet Take 1 tablet (5 mg total) by mouth daily. 30 tablet 4   metFORMIN (GLUCOPHAGE) 1000 MG tablet Take 1 tablet (1,000 mg total) by mouth 2 (two) times daily with a meal. 60 tablet 4   metoprolol succinate (TOPROL-XL) 50 MG 24 hr tablet Take 1 tablet (50 mg total) by mouth daily. Take with or immediately following a meal. 90 tablet 3   ondansetron (ZOFRAN) 4 MG tablet Take 1 tablet (4 mg total) by mouth 4 (four) times daily as needed for nausea or vomiting. 20 tablet 0   oxycodone (ROXICODONE) 30 MG immediate release tablet Take 15 mg by mouth every 6 (six) hours as needed for pain.     pantoprazole (PROTONIX) 40 MG tablet Take 1 tablet (40 mg total) by mouth 2 (two) times daily. 60 tablet 11   pravastatin (PRAVACHOL) 40 MG tablet Take 1 tablet (40 mg total) by mouth every evening. 30 tablet 1   rOPINIRole (REQUIP) 0.5 MG tablet Take 0.5 mg by mouth at bedtime.     sucralfate (CARAFATE) 1 g tablet Take 1 tablet (1 g total) by mouth 4 (four) times daily. 120 tablet 1   No current facility-administered medications for this visit.    Past Medical History:  Diagnosis Date   Asthma    Chronic abdominal pain    Chronic neck pain    C4-6 fusion    CONSTIPATION 05/16/2009   Qualifier: Diagnosis of  By: Ronnald Ramp FNP-BC, Kandice L    Diabetes mellitus without complication ([place])    Dysphagia 02/23/2012   Fatty liver    GERD (gastroesophageal reflux disease)    Helicobacter pylori gastritis 2011   Hyperlipidemia    Hypertension    LIVER FUNCTION TESTS, ABNORMAL, HX OF 05/14/2009   Qualifier: Diagnosis of  By: Olena Heckle, [place]     Migraine headache    MIGRAINE, COMMON 05/14/2009   Qualifier: Diagnosis of  By: Stephan Minister     MS (multiple sclerosis) [facility])    Sleep apnea     Past Surgical History:  Procedure Laterality Date   ABDOMINAL HYSTERECTOMY  APPENDECTOMY     BACK SURGERY  03/07/2012   Nerve Stimulator   BIOPSY  04/23/2022   Procedure: BIOPSY;  Surgeon: Harvel Quale, MD;  [facility];  Service: Gastroenterology;;   CHOLECYSTECTOMY     COLONOSCOPY  07/09/2009   RMR; normal rectum aside from anal canal hemorrhoids/scattered left-sided diverticula   COLONOSCOPY WITH PROPOFOL N/A 04/23/2022   Procedure: COLONOSCOPY WITH PROPOFOL;  Surgeon: Harvel Quale, MD;  Location: [facility];  Service: Gastroenterology;  Laterality: N/A;   ESOPHAGOGASTRODUODENOSCOPY  05/22/2009   RMR; normal /small HH   ESOPHAGOGASTRODUODENOSCOPY  2007   RMR: non-critical Schatzki's rin, non-maniuplated   ESOPHAGOGASTRODUODENOSCOPY N/A 10/24/2013   Procedure: ESOPHAGOGASTRODUODENOSCOPY (EGD);  Surgeon: Daneil Dolin, MD;  [facility]: [facility];  Service: Endoscopy;  Laterality: N/A;  9:45   ESOPHAGOGASTRODUODENOSCOPY N/A 04/23/2022   Procedure: ESOPHAGOGASTRODUODENOSCOPY (EGD);  Surgeon: Montez Morita, Quillian Quince, MD;  [facility]: [facility];  Service: Gastroenterology;  Laterality: N/A;   ESOPHAGOGASTRODUODENOSCOPY (EGD) WITH ESOPHAGEAL DILATION  03/17/2012   [id number] appearing esophageal mucosa of uncertain significance. Status post Venia Minks dilation followed by esophageal bx   MALONEY DILATION N/A  10/24/2013   Procedure: Venia Minks DILATION;  Surgeon: Daneil Dolin, MD;  [facility];  Service: Endoscopy;  Laterality: N/A;   MANDIBLE SURGERY     NECK SURGERY     X2, last time 2013    Family History  Problem Relation Age of Onset   Cancer Mother        unknown type   Diabetes Father    Diabetes Sister    Colon cancer Neg Hx     Allergies as of 05/19/2022 - Review Complete 05/19/2022  Allergen Reaction Noted   Peanut-containing drug products Anaphylaxis and Hives    Shellfish allergy Anaphylaxis 02/23/2018   Gluten meal  07/11/2014   Latex Other (See Comments) 01/15/2011   Prednisone Hives and Other (See Comments)    Nexium [esomeprazole magnesium] Hives 08/15/2011   Tape Rash 06/26/2014    Social History   Socioeconomic History   Marital status: Married    Spouse name: Not on file   Number of children: Not on file   Years of education: Not on file   Highest education level: Not on file  Occupational History   Not on file  Tobacco Use   Smoking status: Never   Smokeless tobacco: Never  Vaping Use   Vaping Use: Never used  Substance and Sexual Activity   Alcohol use: Yes    Comment: only three times a year.   Drug use: No   Sexual activity: Yes    Birth control/protection: Surgical  Other Topics Concern   Not on file  Social History Narrative   5 kids, raising grandchild   Social Determinants of Health   Financial Resource Strain: Not on file  Food Insecurity: Food Insecurity Present (04/20/2022)   Hunger Vital Sign    Worried About Running Out of Food in the Last Year: Often true    Ran Out of Food in the Last Year: Sometimes true  Transportation Needs: No Transportation Needs (04/20/2022)   PRAPARE - Hydrologist (Medical): No    Lack of Transportation (Non-Medical): No  Physical Activity: Not on file  Stress: Not on file  Social Connections: Not on file  Intimate Partner Violence: Not At Risk (04/20/2022)    Humiliation, Afraid, Rape, and Kick questionnaire    Fear of Current or Ex-Partner: No  Emotionally Abused: No    Physically Abused: No    Sexually Abused: No     Review of Systems   Gen: Denies any fever, chills, fatigue, weight loss, lack of appetite.  CV: Denies chest pain, heart palpitations, peripheral edema, syncope.  Resp: Denies shortness of breath at rest or with exertion. Denies wheezing or cough.  GI: see HPI GU : Denies urinary burning, urinary frequency, urinary hesitancy MS: Denies joint pain, muscle weakness, cramps, or limitation of movement.  Derm: Denies rash, itching, dry skin Psych: Denies depression, anxiety, memory loss, and confusion Heme: Denies bruising, bleeding, and enlarged lymph nodes.   Physical Exam   BP 117/67 (BP Location: Left Arm, Patient Position: Sitting, Cuff Size: Large)   Pulse 84   Temp 97.8 F (36.6 C) (Temporal)   Ht 5' 5"$  (1.651 m)   Wt 188 lb 4.8 oz (85.4 kg)   BMI 31.33 kg/m   General:   Alert and oriented. Pleasant and cooperative. Well-nourished and well-developed.  Head:  Normocephalic and atraumatic. Eyes:  Without icterus, sclera clear and conjunctiva pink.  Ears:  Normal auditory acuity. Mouth:  No deformity or lesions, oral mucosa pink.  Lungs:  Clear to auscultation bilaterally. No wheezes, rales, or rhonchi. No distress.  Heart:  S1, S2 present without murmurs appreciated.  Abdomen:  +BS, soft, non-tender and non-distended. No HSM noted. No guarding or rebound. No masses appreciated.  Rectal:  Deferred  Msk:  Symmetrical without gross deformities. Normal posture. Extremities:  Without edema. Neurologic:  Alert and  oriented x4;  grossly normal neurologically. Skin:  Intact without significant lesions or rashes. Psych:  Alert and cooperative. Normal mood and affect.   Assessment   Shanyia C Orocio is a 57 y.o. female with a history of asthma, diabetes, GERD, HLD, HTN, migraines, MS, sleep apnea, cirrhosis, and anemia  presenting today for hospital follow-up of anemia and found to have peptic ulcer disease.  Cirrhosis: Noted on imaging in 2020.  CT A/P during hospital admission with cirrhosis and evidence of portal hypertension given diffuse pericolonic stranding likely representing colopathy.  Her LFTs remain within normal limits and she had no evidence of thrombocytopenia.  MELD sodium was 7 while inpatient.  She had no evidence of varices on EGD.  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  Will obtain viral hepatitis serologies, autoimmune serologies, antiliver kidney antibody, and alpha-1 antitrypsin for further evaluation.  No need for beta-blockers or diuretics at this time.  Discussed nutrition in detail today as well as ongoing follow-ups and care including every 82-monthvisits with lab work.  Will place on recall for follow-up, labs, and ultrasound in 6 months.  Separate cirrhosis education provided.  Normocytic anemia, PUD: EGD and colonoscopy as outlined above.  PUD likely in the setting of frequent high-dose NSAID use.  This is likely contributing factor to her anemia.  Iron studies were low with ferritin 5, iron 16, saturation 4%.  Hemoglobin on discharge 8.2.  Hemoglobin 05/05/2022 improved to 8.8.  Will plan to recheck CBC in 4 weeks.  Pending hemoglobin may start on oral iron therapy.  Advised to continue PPI twice daily and complete Carafate until supply gone.   PLAN   Viral hepatitis panel, ANA, ASMA, AMA, AFP, ceruloplasmin, antiliver kidney antibody, alpha-1 antitrypsin Continue PPI twice daily Continue carafate until supply gone (complete 30 days).  Follow low-fat, high-protein diet Plan for repeat EGD in  April 2024. High-protein diet from a primarily plant-based diet. Avoid red meat.   No raw or undercooked meat, seafood, or shellfish. Low-fat/cholesterol/carbohydrate diet. Limit sodium  to no more than 2000 mg/day including everything that you eat and drink. Recommend at least 30 minutes of aerobic and resistance exercise 3 days/week. Cirrhosis handout provided CBC 4 weeks.  MELD labs in 6 months RUQ [place] in 6 months Follow up in 6 months.  Venetia Night, MSN, FNP-BC, AGACNP-BC [facility]

## 2022-05-19 NOTE — Patient Instructions (Signed)
Nutrition:  High-protein diet from a primarily plant-based diet. Avoid red meat.  No raw or undercooked meat, seafood, or shellfish. Low-fat/cholesterol/carbohydrate diet. Limit sodium to no more than 2000 mg/day including everything that you eat and drink. Recommend at least 30 minutes of aerobic and resistance exercise 3 days/week. Limit tylenol to no more than 2000 mg daily.   MRA mastery of completed at [place] at earliest convenience.  Continue taking pantoprazole 40 mg twice daily  We will contact you to reschedule your EGD for April 2024 once we have received the schedule.  Continue your Carafate and to your current supply is gone.  We will plan to see you in 6 months, we will order labs and ultrasound at that time as well for ongoing surveillance.  I will be in touch with results from lab work once I receive them.  We will call you regarding CBC in 1 month.  I hope you have a great trip.  It was a pleasure to see you today. I want to create trusting relationships with patients. If you receive a survey regarding your visit,  I greatly appreciate you taking time to fill this out on paper or through your MyChart. I value your feedback.  Venetia Night, MSN, FNP-BC, AGACNP-BC [facility]

## 2022-05-20 ENCOUNTER — Encounter: Payer: Self-pay | Admitting: Gastroenterology

## 2022-05-28 ENCOUNTER — Other Ambulatory Visit (INDEPENDENT_AMBULATORY_CARE_PROVIDER_SITE_OTHER): Payer: Self-pay

## 2022-05-28 ENCOUNTER — Encounter (INDEPENDENT_AMBULATORY_CARE_PROVIDER_SITE_OTHER): Payer: Self-pay

## 2022-05-28 DIAGNOSIS — D649 Anemia, unspecified: Secondary | ICD-10-CM

## 2022-06-05 ENCOUNTER — Telehealth (INDEPENDENT_AMBULATORY_CARE_PROVIDER_SITE_OTHER): Payer: Self-pay | Admitting: *Deleted

## 2022-06-05 NOTE — Telephone Encounter (Signed)
LMOVM to call back to schedule EGD with Dr. Gala Romney, asa 3 in April

## 2022-06-10 ENCOUNTER — Encounter: Payer: Self-pay | Admitting: *Deleted

## 2022-06-18 ENCOUNTER — Other Ambulatory Visit: Payer: Self-pay | Admitting: "Endocrinology

## 2022-06-24 ENCOUNTER — Ambulatory Visit: Payer: BC Managed Care – PPO | Admitting: "Endocrinology

## 2022-07-15 ENCOUNTER — Emergency Department (HOSPITAL_COMMUNITY)
Admission: EM | Admit: 2022-07-15 | Discharge: 2022-07-15 | Disposition: A | Discharge status: Home / Self Care | Payer: BC Managed Care – PPO | Source: Home / Self Care | Attending: Emergency Medicine | Admitting: Emergency Medicine

## 2022-07-15 ENCOUNTER — Encounter (HOSPITAL_COMMUNITY): Payer: Self-pay

## 2022-07-15 ENCOUNTER — Emergency Department (HOSPITAL_COMMUNITY): Payer: BC Managed Care – PPO

## 2022-07-15 ENCOUNTER — Other Ambulatory Visit: Payer: Self-pay

## 2022-07-15 DIAGNOSIS — Z1152 Encounter for screening for COVID-19: Secondary | ICD-10-CM | POA: Insufficient documentation

## 2022-07-15 DIAGNOSIS — R7401 Elevation of levels of liver transaminase levels: Secondary | ICD-10-CM | POA: Insufficient documentation

## 2022-07-15 DIAGNOSIS — Z91010 Allergy to peanuts: Secondary | ICD-10-CM | POA: Insufficient documentation

## 2022-07-15 DIAGNOSIS — N390 Urinary tract infection, site not specified: Secondary | ICD-10-CM | POA: Diagnosis not present

## 2022-07-15 DIAGNOSIS — Z91040 Latex allergy status: Secondary | ICD-10-CM | POA: Insufficient documentation

## 2022-07-15 DIAGNOSIS — Z79899 Other long term (current) drug therapy: Secondary | ICD-10-CM | POA: Insufficient documentation

## 2022-07-15 DIAGNOSIS — R7881 Bacteremia: Secondary | ICD-10-CM | POA: Diagnosis not present

## 2022-07-15 DIAGNOSIS — R300 Dysuria: Secondary | ICD-10-CM | POA: Insufficient documentation

## 2022-07-15 DIAGNOSIS — E119 Type 2 diabetes mellitus without complications: Secondary | ICD-10-CM | POA: Insufficient documentation

## 2022-07-15 DIAGNOSIS — Z794 Long term (current) use of insulin: Secondary | ICD-10-CM | POA: Insufficient documentation

## 2022-07-15 DIAGNOSIS — Z7984 Long term (current) use of oral hypoglycemic drugs: Secondary | ICD-10-CM | POA: Insufficient documentation

## 2022-07-15 DIAGNOSIS — I10 Essential (primary) hypertension: Secondary | ICD-10-CM | POA: Insufficient documentation

## 2022-07-15 DIAGNOSIS — Z7902 Long term (current) use of antithrombotics/antiplatelets: Secondary | ICD-10-CM | POA: Insufficient documentation

## 2022-07-15 DIAGNOSIS — E7800 Pure hypercholesterolemia, unspecified: Secondary | ICD-10-CM | POA: Insufficient documentation

## 2022-07-15 DIAGNOSIS — R509 Fever, unspecified: Secondary | ICD-10-CM | POA: Insufficient documentation

## 2022-07-15 LAB — PROTIME-INR
INR: 1.1 (ref 0.8–1.2)
Prothrombin Time: 14 seconds (ref 11.4–15.2)

## 2022-07-15 LAB — URINALYSIS, W/ REFLEX TO CULTURE (INFECTION SUSPECTED)
Bacteria, UA: NONE SEEN
Bilirubin Urine: NEGATIVE
Glucose, UA: NEGATIVE mg/dL
Ketones, ur: NEGATIVE mg/dL
Nitrite: NEGATIVE
Protein, ur: NEGATIVE mg/dL
Specific Gravity, Urine: 1.011 (ref 1.005–1.030)
pH: 7 (ref 5.0–8.0)

## 2022-07-15 LAB — CBC WITH DIFFERENTIAL/PLATELET
Abs Immature Granulocytes: 0.03 10*3/uL (ref 0.00–0.07)
Basophils Absolute: 0 10*3/uL (ref 0.0–0.1)
Basophils Relative: 0 %
Eosinophils Absolute: 0 10*3/uL (ref 0.0–0.5)
Eosinophils Relative: 0 %
HCT: 31.4 % — ABNORMAL LOW (ref 36.0–46.0)
Hemoglobin: 9.7 g/dL — ABNORMAL LOW (ref 12.0–15.0)
Immature Granulocytes: 0 %
Lymphocytes Relative: 11 %
Lymphs Abs: 0.9 10*3/uL (ref 0.7–4.0)
MCH: 23.8 pg — ABNORMAL LOW (ref 26.0–34.0)
MCHC: 30.9 g/dL (ref 30.0–36.0)
MCV: 77 fL — ABNORMAL LOW (ref 80.0–100.0)
Monocytes Absolute: 0.8 10*3/uL (ref 0.1–1.0)
Monocytes Relative: 10 %
Neutro Abs: 6.2 10*3/uL (ref 1.7–7.7)
Neutrophils Relative %: 79 %
Platelets: 173 10*3/uL (ref 150–400)
RBC: 4.08 MIL/uL (ref 3.87–5.11)
RDW: 20.1 % — ABNORMAL HIGH (ref 11.5–15.5)
WBC: 7.9 10*3/uL (ref 4.0–10.5)
nRBC: 0 % (ref 0.0–0.2)

## 2022-07-15 LAB — SARS CORONAVIRUS 2 BY RT PCR: SARS Coronavirus 2 by RT PCR: NEGATIVE

## 2022-07-15 LAB — COMPREHENSIVE METABOLIC PANEL
ALT: 42 U/L (ref 0–44)
AST: 122 U/L — ABNORMAL HIGH (ref 15–41)
Albumin: 2.8 g/dL — ABNORMAL LOW (ref 3.5–5.0)
Alkaline Phosphatase: 123 U/L (ref 38–126)
Anion gap: 8 (ref 5–15)
BUN: 16 mg/dL (ref 6–20)
CO2: 25 mmol/L (ref 22–32)
Calcium: 8 mg/dL — ABNORMAL LOW (ref 8.9–10.3)
Chloride: 101 mmol/L (ref 98–111)
Creatinine, Ser: 0.83 mg/dL (ref 0.44–1.00)
GFR, Estimated: >60 mL/min (ref >60)
Glucose, Bld: 127 mg/dL — ABNORMAL HIGH (ref 70–99)
Potassium: 3.7 mmol/L (ref 3.5–5.1)
Sodium: 134 mmol/L — ABNORMAL LOW (ref 135–145)
Total Bilirubin: 0.7 mg/dL (ref 0.3–1.2)
Total Protein: 7.7 g/dL (ref 6.5–8.1)

## 2022-07-15 LAB — CULTURE, BLOOD (ROUTINE X 2)

## 2022-07-15 LAB — APTT: aPTT: 30 seconds (ref 24–36)

## 2022-07-15 LAB — LACTIC ACID, PLASMA: Lactic Acid, Venous: 1 mmol/L (ref 0.5–1.9)

## 2022-07-15 LAB — LIPASE, BLOOD: Lipase: 19 U/L (ref 11–51)

## 2022-07-15 MED ORDER — ACETAMINOPHEN 325 MG PO TABS
650.0000 mg | ORAL_TABLET | Freq: Once | ORAL | Status: AC
  Administered 2022-07-15: 650 mg via ORAL
  Filled 2022-07-15: qty 2

## 2022-07-15 MED ORDER — LACTATED RINGERS IV BOLUS
1000.0000 mL | Freq: Once | INTRAVENOUS | Status: AC
  Administered 2022-07-15: 1000 mL via INTRAVENOUS

## 2022-07-15 MED ORDER — ONDANSETRON HCL 4 MG/2ML IJ SOLN
4.0000 mg | Freq: Once | INTRAMUSCULAR | Status: AC
  Administered 2022-07-15: 4 mg via INTRAVENOUS
  Filled 2022-07-15: qty 2

## 2022-07-15 MED ORDER — HYDROCODONE-ACETAMINOPHEN 5-325 MG PO TABS
2.0000 | ORAL_TABLET | Freq: Once | ORAL | Status: AC
  Administered 2022-07-15: 2 via ORAL
  Filled 2022-07-15: qty 2

## 2022-07-15 MED ORDER — CEPHALEXIN 500 MG PO CAPS: ORAL_CAPSULE | ORAL | 0 refills | Status: DC

## 2022-07-15 MED ORDER — BACLOFEN 10 MG PO TABS
10.0000 mg | ORAL_TABLET | Freq: Three times a day (TID) | ORAL | Status: DC
  Administered 2022-07-15: 10 mg via ORAL
  Filled 2022-07-15: qty 1

## 2022-07-15 MED ORDER — BACLOFEN 10 MG PO TABS: 10.0000 mg | ORAL_TABLET | Freq: Three times a day (TID) | ORAL | 0 refills | Status: DC

## 2022-07-15 NOTE — Discharge Instructions (Signed)
Get help right away if you your fever is worsening or returning, you develop new symptoms.  You develop a rash or confusion.  Contact a health care provider if: You have vomiting or diarrhea that does not get better. You cannot eat or drink without vomiting. You have pain when you pee (urinate). Your symptoms do not get better with treatment or you have new symptoms. You have a skin rash. You have signs of dehydration, such as: Dark pee, very little pee, or no pee. Cracked lips or dry mouth. Sunken eyes. Sleepiness. Weakness. Get help right away if: You have shortness of breath or trouble breathing. You feel dizzy or you faint. You are confused and do not know the time of day, where you are, or who you are (disoriented). You have severe pain in your abdomen. These symptoms may be an emergency. Get help right away. Call 911.

## 2022-07-15 NOTE — ED Notes (Signed)
Pt unable to give UA at this time. Pt to call when ready.

## 2022-07-15 NOTE — ED Notes (Signed)
Ambulatory to restroom

## 2022-07-15 NOTE — ED Provider Notes (Signed)
[place] EMERGENCY DEPARTMENT AT [facility] Provider Note   CSN: [id number] Arrival date & time: 07/15/22  1449     History  Chief Complaint  Patient presents with   Dysuria    Fever, hx UTI's    Tauriel C Kotecki is a 57 y.o. female.   Dysuria Associated symptoms: fever, flank pain, nausea and vomiting   Associated symptoms: no abdominal pain        Fatumata C Gotto is a 57 y.o. female past medical history of HTN, multiple sclerosis, chronic pain syndrome, type 2 diabetes who presents to the Emergency Department complaining of generalized malaise, dysuria, fever and frequent urination.  Symptoms began 1 week ago.  She noticed some urinary frequency at onset.  Had some nausea vomiting couple days ago.  Concerned that she may be developing sepsis as she stated that she was admitted here in January with sepsis secondary to UTI.  She endorses frequent UTIs without prior urology follow-up.  Subjective fever at home.  Intermittently taking Tylenol.  Some suprapubic discomfort and reports bilateral flank pain.   Also states that she had a seizure two nights ago.  She believes her seizure was related to fever.  States family member witness event, no hx of seizures  No history of kidney stones.  Home Medications Prior to Admission medications   Medication Sig Start Date End Date Taking? Authorizing Provider  ACCU-CHEK GUIDE test strip USE TO CHECK BLOOD SUGAR 4 TIMES DAILY AS DIRECTED 03/06/22   Nida, Denman George, MD  Accu-Chek Softclix Lancets lancets USE TO CHECK BLOOD SUGAR 4 TIMES DAILY AS DIRECTED 03/06/22   Nida, Denman George, MD  albuterol (ACCUNEB) 0.63 MG/3ML nebulizer solution Take 1 ampule by nebulization every 6 (six) hours as needed for wheezing or shortness of breath. 11/24/21   [provider]  albuterol (VENTOLIN HFA) 108 (90 Base) MCG/ACT inhaler Inhale 1-2 puffs into the lungs every 6 (six) hours as needed for wheezing or shortness of breath.  01/02/19    [provider]  baclofen (LIORESAL) 20 MG tablet Take 20 mg by mouth 3 (three) times daily.  05/08/14   [provider]  blood glucose meter kit and supplies KIT Dispense based on patient and insurance preference. Use up to four times daily as directed. (FOR ICD-9 250.00, 250.01). 03/15/19   Joseph Art, DO  Blood Glucose Monitoring Suppl (ACCU-CHEK GUIDE ME) w/Device KIT USE TO CHECK BLOOD SUGAR DAILY AS DIRECTED 12/31/21   Nida, Denman George, MD  citalopram (CELEXA) 40 MG tablet Take 40 mg by mouth every evening.     [provider]  clopidogrel (PLAVIX) 75 MG tablet Take 75 mg by mouth daily. 11/27/21   [provider]  Continuous Blood Gluc Receiver (DEXCOM G7 RECEIVER) DEVI Use to check glucose as directed 10/28/21   Roma Kayser, MD  Continuous Blood Gluc Sensor (DEXCOM G7 SENSOR) MISC Change sensor every 10 days 01/19/22   Roma Kayser, MD  gabapentin (NEURONTIN) 800 MG tablet Take 800 mg by mouth 4 (four) times daily. 01/02/19   [provider]  insulin isophane & regular human KwikPen (NOVOLIN 70/30 KWIKPEN) (70-30) 100 UNIT/ML KwikPen Inject 10 Units into the skin 2 (two) times daily before a meal. 12/02/21   Erick Blinks, MD  Insulin Pen Needle (B-D ULTRAFINE III SHORT PEN) 31G X 8 MM MISC USE TO INJECT INSULIN 2 TIMES DAILY 06/19/22   Roma Kayser, MD  lisinopril (ZESTRIL) 5 MG tablet  Take 1 tablet (5 mg total) by mouth daily. 04/25/22   Shon Hale, MD  metFORMIN (GLUCOPHAGE) 1000 MG tablet Take 1 tablet (1,000 mg total) by mouth 2 (two) times daily with a meal. 04/25/22   Emokpae, Courage, MD  metoprolol succinate (TOPROL-XL) 50 MG 24 hr tablet Take 1 tablet (50 mg total) by mouth daily. Take with or immediately following a meal. 04/25/22 04/20/23  Shon Hale, MD  ondansetron (ZOFRAN) 4 MG tablet Take 1 tablet (4 mg total) by mouth 4 (four) times daily as needed for nausea or vomiting. 04/25/22   Shon Hale, MD  oxycodone (ROXICODONE) 30 MG immediate release tablet Take 15 mg by mouth every 6 (six) hours as needed for pain. 10/31/21   [provider]  pantoprazole (PROTONIX) 40 MG tablet Take 1 tablet (40 mg total) by mouth 2 (two) times daily. 04/25/22 04/25/23  Shon Hale, MD  pravastatin (PRAVACHOL) 40 MG tablet Take 1 tablet (40 mg total) by mouth every evening. 03/15/19   Black, Lesle Chris, NP  rOPINIRole (REQUIP) 0.5 MG tablet Take 0.5 mg by mouth at bedtime. 11/27/21   [provider]  sucralfate (CARAFATE) 1 g tablet Take 1 tablet (1 g total) by mouth 4 (four) times daily. 04/25/22 04/25/23  Shon Hale, MD      Allergies    Peanut-containing drug products, Shellfish allergy, Gluten meal, Latex, Other, Prednisone, Nexium [esomeprazole magnesium], and Tape    Review of Systems   Review of Systems  Constitutional:  Positive for chills and fever. Negative for appetite change.  Respiratory:  Negative for shortness of breath.   Cardiovascular:  Negative for chest pain.  Gastrointestinal:  Positive for nausea and vomiting. Negative for abdominal pain and diarrhea.  Genitourinary:  Positive for dysuria, flank pain and frequency.  Musculoskeletal:  Positive for back pain.  Skin:  Negative for rash.  Neurological:  Negative for weakness and numbness.    Physical Exam Updated Vital Signs BP (!) 154/79 (BP Location: Right Arm)   Pulse 98   Temp 99.9 F (37.7 C) (Oral)   Resp 17   Ht  (1.651 m)   Wt 83.9 kg   SpO2 96%   BMI 30.79 kg/m  Physical Exam Vitals and nursing note reviewed.  Constitutional:      General: She is not in acute distress.    Appearance: Normal appearance. She is not ill-appearing or toxic-appearing.  Cardiovascular:     Rate and Rhythm: Normal rate and regular rhythm.     Pulses: Normal pulses.  Pulmonary:     Effort: Pulmonary effort is normal.  Abdominal:     General: There is no distension.     Palpations: Abdomen is  soft.     Tenderness: There is no abdominal tenderness. There is right CVA tenderness and left CVA tenderness.  Musculoskeletal:        General: Tenderness present.     Right lower leg: No edema.     Left lower leg: No edema.     Comments: Diffuse tenderness to palpation of the bilateral lumbar paraspinal muscles.  Skin:    General: Skin is warm.     Capillary Refill: Capillary refill takes less than 2 seconds.     Findings: No rash.  Neurological:     General: No focal deficit present.     Mental Status: She is alert.     Sensory: No sensory deficit.     Motor: No weakness.     ED  Results / Procedures / Treatments   Labs (all labs ordered are listed, but only abnormal results are displayed) Labs Reviewed  CBC WITH DIFFERENTIAL/PLATELET - Abnormal; Notable for the following components:      Result Value   Hemoglobin 9.7 (*)    HCT 31.4 (*)    MCV 77.0 (*)    MCH 23.8 (*)    RDW 20.1 (*)    All other components within normal limits  COMPREHENSIVE METABOLIC PANEL - Abnormal; Notable for the following components:   Sodium 134 (*)    Glucose, Bld 127 (*)    Calcium 8.0 (*)    Albumin 2.8 (*)    AST 122 (*)    All other components within normal limits  LIPASE, BLOOD  URINALYSIS, W/ REFLEX TO CULTURE (INFECTION SUSPECTED)    EKG None  Radiology No results found.  Procedures Procedures    Medications Ordered in ED Medications  lactated ringers bolus 1,000 mL (1,000 mLs Intravenous New Bag/Given 07/15/22 1655)  ondansetron (ZOFRAN) injection 4 mg (4 mg Intravenous Given 07/15/22 1725)    ED Course/ Medical Decision Making/ A&P Clinical Course as of 07/15/22 1941  Wed Jul 15, 2022  1928 Here with uti/ fever, patient thinks she is septic. Thinks she had a seizure (new onset) a few nights ago. C/o dysuria. Febrile here . Does not meet sepsis criteria.   [AH]    Clinical Course User Index [AH] Arthor Captain, PA-C                             Medical Decision  Making Patient here with reported fever at home, nausea, vomiting, urinary frequency and suprapubic tenderness.  Concerned that she may be developing sepsis secondary to UTI.  Notes history of same that required hospital admission in January of this year.  States her symptoms have been present for 1 week.  No prior medical evaluation for her symptoms.  Differential would include but not limited to sepsis, UTI, pyelonephritis, musculoskeletal pain, acute abdominal/pelvic process  On my exam, patient has low-grade fever vitals otherwise reassuring.  She does not appear septic on arrival.  Does not meet SIRS criteria at this time, evolving sepsis considered.  Will check labs, UA  Amount and/or Complexity of Data Reviewed Labs: ordered.    Details: Labs interpreted by me, no leukocytosis, hemoglobin 9.7 today this is improved from 2 months ago.  Chemistries without significant derangement, slightly elevated AST at 122, ALT and alk phos unremarkable, lipase also unremarkable. urinalysis w/o evidence of infection Discussion of management or test interpretation with external provider(s):  IV fluids here, recheck patient resting comfortably, but has spiked temp of 101.9, evolving sepsis now considered,  lactic acid pending. Source of fever currently unclear.  No vomiting or diarrhea here  Urine not source of patient's fever.  She may require hospital admission.  Discussed with Arthor Captain, PA-C who assumes care and will arrange disposition.       Risk Prescription drug management.           Final Clinical Impression(s) / ED Diagnoses Final diagnoses:  None    Rx / DC Orders ED Discharge Orders     None         Pauline Aus, PA-C 07/15/22 1949    Derwood Kaplan, MD 07/16/22 [id number]

## 2022-07-15 NOTE — ED Triage Notes (Signed)
Pt to ED from home with c/o possible UTI, pt says she has had several of these and thinks that is why she feels bad, sx include fever, dysuria, frequent urination x 4-5 days with fever starting today.

## 2022-07-16 LAB — CULTURE, BLOOD (ROUTINE X 2): Special Requests: ADEQUATE

## 2022-07-17 ENCOUNTER — Inpatient Hospital Stay (HOSPITAL_COMMUNITY): Payer: BC Managed Care – PPO

## 2022-07-17 ENCOUNTER — Encounter (HOSPITAL_COMMUNITY): Payer: Self-pay | Admitting: *Deleted

## 2022-07-17 ENCOUNTER — Inpatient Hospital Stay (HOSPITAL_COMMUNITY)
Admission: EM | Admit: 2022-07-17 | Discharge: 2022-07-20 | DRG: 690 | Disposition: A | Discharge status: Home / Self Care | Payer: BC Managed Care – PPO | Attending: Internal Medicine | Admitting: Internal Medicine

## 2022-07-17 ENCOUNTER — Other Ambulatory Visit: Payer: Self-pay

## 2022-07-17 DIAGNOSIS — Z794 Long term (current) use of insulin: Secondary | ICD-10-CM | POA: Diagnosis not present

## 2022-07-17 DIAGNOSIS — G4730 Sleep apnea, unspecified: Secondary | ICD-10-CM | POA: Diagnosis present

## 2022-07-17 DIAGNOSIS — J45909 Unspecified asthma, uncomplicated: Secondary | ICD-10-CM | POA: Diagnosis present

## 2022-07-17 DIAGNOSIS — Z91040 Latex allergy status: Secondary | ICD-10-CM

## 2022-07-17 DIAGNOSIS — Z87440 Personal history of urinary (tract) infections: Secondary | ICD-10-CM

## 2022-07-17 DIAGNOSIS — E1159 Type 2 diabetes mellitus with other circulatory complications: Secondary | ICD-10-CM | POA: Diagnosis present

## 2022-07-17 DIAGNOSIS — E785 Hyperlipidemia, unspecified: Secondary | ICD-10-CM | POA: Diagnosis present

## 2022-07-17 DIAGNOSIS — I10 Essential (primary) hypertension: Secondary | ICD-10-CM | POA: Diagnosis present

## 2022-07-17 DIAGNOSIS — Z888 Allergy status to other drugs, medicaments and biological substances status: Secondary | ICD-10-CM

## 2022-07-17 DIAGNOSIS — Z981 Arthrodesis status: Secondary | ICD-10-CM

## 2022-07-17 DIAGNOSIS — G8929 Other chronic pain: Secondary | ICD-10-CM | POA: Diagnosis present

## 2022-07-17 DIAGNOSIS — B9620 Unspecified Escherichia coli [E. coli] as the cause of diseases classified elsewhere: Secondary | ICD-10-CM | POA: Diagnosis present

## 2022-07-17 DIAGNOSIS — R7881 Bacteremia: Principal | ICD-10-CM | POA: Diagnosis present

## 2022-07-17 DIAGNOSIS — Z91013 Allergy to seafood: Secondary | ICD-10-CM

## 2022-07-17 DIAGNOSIS — Z833 Family history of diabetes mellitus: Secondary | ICD-10-CM | POA: Diagnosis not present

## 2022-07-17 DIAGNOSIS — E876 Hypokalemia: Secondary | ICD-10-CM | POA: Diagnosis present

## 2022-07-17 DIAGNOSIS — Z7984 Long term (current) use of oral hypoglycemic drugs: Secondary | ICD-10-CM

## 2022-07-17 DIAGNOSIS — Z9049 Acquired absence of other specified parts of digestive tract: Secondary | ICD-10-CM | POA: Diagnosis not present

## 2022-07-17 DIAGNOSIS — Z90710 Acquired absence of both cervix and uterus: Secondary | ICD-10-CM

## 2022-07-17 DIAGNOSIS — N390 Urinary tract infection, site not specified: Principal | ICD-10-CM | POA: Insufficient documentation

## 2022-07-17 DIAGNOSIS — G35 Multiple sclerosis: Secondary | ICD-10-CM | POA: Diagnosis present

## 2022-07-17 DIAGNOSIS — K760 Fatty (change of) liver, not elsewhere classified: Secondary | ICD-10-CM | POA: Diagnosis present

## 2022-07-17 DIAGNOSIS — Z91010 Allergy to peanuts: Secondary | ICD-10-CM

## 2022-07-17 DIAGNOSIS — D649 Anemia, unspecified: Secondary | ICD-10-CM | POA: Diagnosis present

## 2022-07-17 DIAGNOSIS — Z79899 Other long term (current) drug therapy: Secondary | ICD-10-CM

## 2022-07-17 DIAGNOSIS — G894 Chronic pain syndrome: Secondary | ICD-10-CM | POA: Diagnosis present

## 2022-07-17 DIAGNOSIS — Z7902 Long term (current) use of antithrombotics/antiplatelets: Secondary | ICD-10-CM | POA: Diagnosis not present

## 2022-07-17 DIAGNOSIS — G35D Multiple sclerosis, unspecified: Secondary | ICD-10-CM | POA: Diagnosis present

## 2022-07-17 DIAGNOSIS — Z1152 Encounter for screening for COVID-19: Secondary | ICD-10-CM | POA: Diagnosis not present

## 2022-07-17 LAB — BLOOD CULTURE ID PANEL (REFLEXED) - BCID2

## 2022-07-17 LAB — CBC WITH DIFFERENTIAL/PLATELET
Abs Immature Granulocytes: 0.02 10*3/uL (ref 0.00–0.07)
Basophils Absolute: 0 10*3/uL (ref 0.0–0.1)
Basophils Relative: 1 %
Eosinophils Absolute: 0 10*3/uL (ref 0.0–0.5)
Eosinophils Relative: 1 %
HCT: 32.3 % — ABNORMAL LOW (ref 36.0–46.0)
Hemoglobin: 9.9 g/dL — ABNORMAL LOW (ref 12.0–15.0)
Immature Granulocytes: 1 %
Lymphocytes Relative: 22 %
Lymphs Abs: 1 10*3/uL (ref 0.7–4.0)
MCH: 23.5 pg — ABNORMAL LOW (ref 26.0–34.0)
MCHC: 30.7 g/dL (ref 30.0–36.0)
MCV: 76.5 fL — ABNORMAL LOW (ref 80.0–100.0)
Monocytes Absolute: 0.6 10*3/uL (ref 0.1–1.0)
Monocytes Relative: 12 %
Neutro Abs: 2.8 10*3/uL (ref 1.7–7.7)
Neutrophils Relative %: 63 %
Platelets: 184 10*3/uL (ref 150–400)
RBC: 4.22 MIL/uL (ref 3.87–5.11)
RDW: 20.3 % — ABNORMAL HIGH (ref 11.5–15.5)
WBC: 4.4 10*3/uL (ref 4.0–10.5)
nRBC: 0 % (ref 0.0–0.2)

## 2022-07-17 LAB — COMPREHENSIVE METABOLIC PANEL
ALT: 39 U/L (ref 0–44)
AST: 90 U/L — ABNORMAL HIGH (ref 15–41)
Albumin: 2.8 g/dL — ABNORMAL LOW (ref 3.5–5.0)
Alkaline Phosphatase: 137 U/L — ABNORMAL HIGH (ref 38–126)
Anion gap: 8 (ref 5–15)
BUN: 14 mg/dL (ref 6–20)
CO2: 26 mmol/L (ref 22–32)
Calcium: 8.1 mg/dL — ABNORMAL LOW (ref 8.9–10.3)
Chloride: 99 mmol/L (ref 98–111)
Creatinine, Ser: 0.85 mg/dL (ref 0.44–1.00)
GFR, Estimated: >60 mL/min (ref >60)
Glucose, Bld: 108 mg/dL — ABNORMAL HIGH (ref 70–99)
Potassium: 3.2 mmol/L — ABNORMAL LOW (ref 3.5–5.1)
Sodium: 133 mmol/L — ABNORMAL LOW (ref 135–145)
Total Bilirubin: 0.3 mg/dL (ref 0.3–1.2)
Total Protein: 8.1 g/dL (ref 6.5–8.1)

## 2022-07-17 LAB — LACTIC ACID, PLASMA
Lactic Acid, Venous: 1.2 mmol/L (ref 0.5–1.9)
Lactic Acid, Venous: 1.2 mmol/L (ref 0.5–1.9)

## 2022-07-17 LAB — URINALYSIS, ROUTINE W REFLEX MICROSCOPIC
Bilirubin Urine: NEGATIVE
Glucose, UA: NEGATIVE mg/dL
Hgb urine dipstick: NEGATIVE
Ketones, ur: 5 mg/dL — AB
Nitrite: NEGATIVE
Protein, ur: 30 mg/dL — AB
Specific Gravity, Urine: 1.019 (ref 1.005–1.030)
pH: 6 (ref 5.0–8.0)

## 2022-07-17 LAB — GLUCOSE, CAPILLARY: Glucose-Capillary: 206 mg/dL — ABNORMAL HIGH (ref 70–99)

## 2022-07-17 LAB — CULTURE, BLOOD (ROUTINE X 2)

## 2022-07-17 MED ORDER — CLOPIDOGREL BISULFATE 75 MG PO TABS
75.0000 mg | ORAL_TABLET | Freq: Every day | ORAL | Status: DC
  Administered 2022-07-17 – 2022-07-20 (×4): 75 mg via ORAL
  Filled 2022-07-17 (×4): qty 1

## 2022-07-17 MED ORDER — INSULIN GLARGINE-YFGN 100 UNIT/ML ~~LOC~~ SOLN
10.0000 [IU] | Freq: Two times a day (BID) | SUBCUTANEOUS | Status: DC
  Administered 2022-07-17 – 2022-07-19 (×5): 10 [IU] via SUBCUTANEOUS
  Filled 2022-07-17 (×8): qty 0.1

## 2022-07-17 MED ORDER — ACETAMINOPHEN 500 MG PO TABS
1000.0000 mg | ORAL_TABLET | Freq: Once | ORAL | Status: AC
  Administered 2022-07-17: 1000 mg via ORAL
  Filled 2022-07-17: qty 2

## 2022-07-17 MED ORDER — PANTOPRAZOLE SODIUM 40 MG PO TBEC
40.0000 mg | DELAYED_RELEASE_TABLET | Freq: Two times a day (BID) | ORAL | Status: DC
  Administered 2022-07-17 – 2022-07-20 (×6): 40 mg via ORAL
  Filled 2022-07-17 (×6): qty 1

## 2022-07-17 MED ORDER — ROPINIROLE HCL 0.25 MG PO TABS
0.5000 mg | ORAL_TABLET | Freq: Every day | ORAL | Status: DC
  Administered 2022-07-17 – 2022-07-19 (×3): 0.5 mg via ORAL
  Filled 2022-07-17 (×3): qty 2

## 2022-07-17 MED ORDER — ONDANSETRON HCL 4 MG PO TABS: 4.0000 mg | ORAL_TABLET | Freq: Four times a day (QID) | ORAL | Status: DC | PRN

## 2022-07-17 MED ORDER — METOPROLOL SUCCINATE ER 50 MG PO TB24
50.0000 mg | ORAL_TABLET | Freq: Every day | ORAL | Status: DC
  Administered 2022-07-17 – 2022-07-20 (×4): 50 mg via ORAL
  Filled 2022-07-17 (×4): qty 1

## 2022-07-17 MED ORDER — INSULIN ASPART 100 UNIT/ML IJ SOLN
0.0000 [IU] | Freq: Every day | INTRAMUSCULAR | Status: DC
  Administered 2022-07-17: 2 [IU] via SUBCUTANEOUS
  Administered 2022-07-19: 3 [IU] via SUBCUTANEOUS

## 2022-07-17 MED ORDER — SODIUM CHLORIDE 0.9 % IV BOLUS
1000.0000 mL | Freq: Once | INTRAVENOUS | Status: AC
  Administered 2022-07-17: 1000 mL via INTRAVENOUS

## 2022-07-17 MED ORDER — INSULIN ASPART 100 UNIT/ML IJ SOLN
0.0000 [IU] | Freq: Three times a day (TID) | INTRAMUSCULAR | Status: DC
  Administered 2022-07-18 – 2022-07-19 (×2): 2 [IU] via SUBCUTANEOUS
  Administered 2022-07-19: 3 [IU] via SUBCUTANEOUS

## 2022-07-17 MED ORDER — SODIUM CHLORIDE 0.9 % IV SOLN
2.0000 g | Freq: Once | INTRAVENOUS | Status: AC
  Administered 2022-07-17: 2 g via INTRAVENOUS
  Filled 2022-07-17: qty 20

## 2022-07-17 MED ORDER — MORPHINE SULFATE (PF) 4 MG/ML IV SOLN
4.0000 mg | Freq: Once | INTRAVENOUS | Status: AC
  Administered 2022-07-17: 4 mg via INTRAVENOUS
  Filled 2022-07-17: qty 1

## 2022-07-17 MED ORDER — PRAVASTATIN SODIUM 40 MG PO TABS
40.0000 mg | ORAL_TABLET | Freq: Every evening | ORAL | Status: DC
  Administered 2022-07-17 – 2022-07-19 (×3): 40 mg via ORAL
  Filled 2022-07-17 (×3): qty 1

## 2022-07-17 MED ORDER — BACLOFEN 10 MG PO TABS
10.0000 mg | ORAL_TABLET | Freq: Three times a day (TID) | ORAL | Status: DC
  Administered 2022-07-17 – 2022-07-20 (×8): 10 mg via ORAL
  Filled 2022-07-17 (×9): qty 1

## 2022-07-17 MED ORDER — SODIUM CHLORIDE 0.9 % IV SOLN
2.0000 g | Freq: Once | INTRAVENOUS | Status: AC
  Administered 2022-07-18: 2 g via INTRAVENOUS
  Filled 2022-07-17: qty 20

## 2022-07-17 MED ORDER — POTASSIUM CHLORIDE CRYS ER 20 MEQ PO TBCR
40.0000 meq | EXTENDED_RELEASE_TABLET | Freq: Two times a day (BID) | ORAL | Status: DC
  Administered 2022-07-17 – 2022-07-18 (×2): 40 meq via ORAL
  Filled 2022-07-17 (×2): qty 2

## 2022-07-17 MED ORDER — ENOXAPARIN SODIUM 40 MG/0.4ML IJ SOSY
40.0000 mg | PREFILLED_SYRINGE | INTRAMUSCULAR | Status: DC
  Administered 2022-07-17 – 2022-07-19 (×3): 40 mg via SUBCUTANEOUS
  Filled 2022-07-17 (×3): qty 0.4

## 2022-07-17 MED ORDER — KETOROLAC TROMETHAMINE 15 MG/ML IJ SOLN
15.0000 mg | Freq: Four times a day (QID) | INTRAMUSCULAR | Status: DC | PRN
  Administered 2022-07-17 – 2022-07-19 (×5): 15 mg via INTRAVENOUS
  Filled 2022-07-17 (×5): qty 1

## 2022-07-17 MED ORDER — CITALOPRAM HYDROBROMIDE 20 MG PO TABS
40.0000 mg | ORAL_TABLET | Freq: Every evening | ORAL | Status: DC
  Administered 2022-07-17 – 2022-07-19 (×3): 40 mg via ORAL
  Filled 2022-07-17 (×3): qty 2

## 2022-07-17 MED ORDER — ONDANSETRON HCL 4 MG/2ML IJ SOLN
4.0000 mg | Freq: Four times a day (QID) | INTRAMUSCULAR | Status: DC | PRN
  Administered 2022-07-18 – 2022-07-19 (×4): 4 mg via INTRAVENOUS
  Filled 2022-07-17 (×4): qty 2

## 2022-07-17 NOTE — ED Provider Notes (Signed)
[place] EMERGENCY DEPARTMENT AT [facility] Provider Note   CSN: [id number] Arrival date & time: 07/17/22  1258     History  Chief Complaint  Patient presents with   Abnormal Lab    [name] is a 57 y.o. female.   Abnormal Lab  This patient is a 57 year old female, she had presented to the ER 2 days ago because of some urinary symptoms and had a septic workup, she appeared well and was sent home on antibiotics for likely UTI however her blood cultures have come back positive for E. coli, she states that she is still feeling weak, she has some lower back pain, she has had a feeling of having a fever but has not measured 1, she has nauseated but not vomiting, she denies chest pain coughing or shortness of breath.  She was told to come in because of a positive blood culture.    Home Medications Prior to Admission medications   Medication Sig Start Date End Date Taking? Authorizing Provider  ACCU-CHEK GUIDE test strip USE TO CHECK BLOOD SUGAR 4 TIMES DAILY AS DIRECTED 03/06/22   Nida, Denman George, MD  Accu-Chek Softclix Lancets lancets USE TO CHECK BLOOD SUGAR 4 TIMES DAILY AS DIRECTED 03/06/22   Nida, Denman George, MD  albuterol (ACCUNEB) 0.63 MG/3ML nebulizer solution Take 1 ampule by nebulization every 6 (six) hours as needed for wheezing or shortness of breath. 11/24/21   [provider]  albuterol (VENTOLIN HFA) 108 (90 Base) MCG/ACT inhaler Inhale 1-2 puffs into the lungs every 6 (six) hours as needed for wheezing or shortness of breath.  01/02/19   [provider]  baclofen (LIORESAL) 10 MG tablet Take 1 tablet (10 mg total) by mouth 3 (three) times daily. 07/15/22   Harris, Cammy Copa, PA-C  baclofen (LIORESAL) 20 MG tablet Take 20 mg by mouth 3 (three) times daily.  05/08/14   [provider]  blood glucose meter kit and supplies KIT Dispense based on patient and insurance preference. Use up to four times daily as directed. (FOR ICD-9  250.00, 250.01). 03/15/19   Joseph Art, DO  Blood Glucose Monitoring Suppl (ACCU-CHEK GUIDE ME) w/Device KIT USE TO CHECK BLOOD SUGAR DAILY AS DIRECTED 12/31/21   Nida, Denman George, MD  cephALEXin (KEFLEX) 500 MG capsule 2 caps po bid x 7 days 07/15/22   Arthor Captain, PA-C  citalopram (CELEXA) 40 MG tablet Take 40 mg by mouth every evening.     [provider]  clopidogrel (PLAVIX) 75 MG tablet Take 75 mg by mouth daily. 11/27/21   [provider]  Continuous Blood Gluc Receiver (DEXCOM G7 RECEIVER) DEVI Use to check glucose as directed 10/28/21   Roma Kayser, MD  Continuous Blood Gluc Sensor (DEXCOM G7 SENSOR) MISC Change sensor every 10 days 01/19/22   Roma Kayser, MD  gabapentin (NEURONTIN) 800 MG tablet Take 800 mg by mouth 4 (four) times daily. 01/02/19   [provider]  insulin isophane & regular human KwikPen (NOVOLIN 70/30 KWIKPEN) (70-30) 100 UNIT/ML KwikPen Inject 10 Units into the skin 2 (two) times daily before a meal. 12/02/21   Erick Blinks, MD  Insulin Pen Needle (B-D ULTRAFINE III SHORT PEN) 31G X 8 MM MISC USE TO INJECT INSULIN 2 TIMES DAILY 06/19/22   Roma Kayser, MD  lisinopril (ZESTRIL) 5 MG tablet Take 1 tablet (5 mg total) by mouth daily. 04/25/22   Shon Hale, MD  metFORMIN (GLUCOPHAGE) 1000 MG tablet  Take 1 tablet (1,000 mg total) by mouth 2 (two) times daily with a meal. 04/25/22   Emokpae, Courage, MD  metoprolol succinate (TOPROL-XL) 50 MG 24 hr tablet Take 1 tablet (50 mg total) by mouth daily. Take with or immediately following a meal. 04/25/22 04/20/23  Shon Hale, MD  ondansetron (ZOFRAN) 4 MG tablet Take 1 tablet (4 mg total) by mouth 4 (four) times daily as needed for nausea or vomiting. 04/25/22   Shon Hale, MD  oxycodone (ROXICODONE) 30 MG immediate release tablet Take 15 mg by mouth every 6 (six) hours as needed for pain. 10/31/21   [provider]  pantoprazole (PROTONIX) 40 MG  tablet Take 1 tablet (40 mg total) by mouth 2 (two) times daily. 04/25/22 04/25/23  Shon Hale, MD  pravastatin (PRAVACHOL) 40 MG tablet Take 1 tablet (40 mg total) by mouth every evening. 03/15/19   Black, Lesle Chris, NP  rOPINIRole (REQUIP) 0.5 MG tablet Take 0.5 mg by mouth at bedtime. 11/27/21   [provider]  sucralfate (CARAFATE) 1 g tablet Take 1 tablet (1 g total) by mouth 4 (four) times daily. 04/25/22 04/25/23  Shon Hale, MD      Allergies    Peanut-containing drug products, Shellfish allergy, Gluten meal, Latex, Other, Prednisone, Nexium [esomeprazole magnesium], and Tape    Review of Systems   Review of Systems  All other systems reviewed and are negative.   Physical Exam Updated Vital Signs BP (!) 157/76   Pulse (!) 101   Temp 99.4 F (37.4 C) (Oral)   Resp 16   SpO2 99%  Physical Exam Vitals and nursing note reviewed.  Constitutional:      General: She is not in acute distress.    Appearance: She is well-developed.  HENT:     Head: Normocephalic and atraumatic.     Mouth/Throat:     Pharynx: No oropharyngeal exudate.  Eyes:     General: No scleral icterus.       Right eye: No discharge.        Left eye: No discharge.     Conjunctiva/sclera: Conjunctivae normal.     Pupils: Pupils are equal, round, and reactive to light.  Neck:     Thyroid: No thyromegaly.     Vascular: No JVD.  Cardiovascular:     Rate and Rhythm: Regular rhythm. Tachycardia present.     Heart sounds: Normal heart sounds. No murmur heard.    No friction rub. No gallop.  Pulmonary:     Effort: Pulmonary effort is normal. No respiratory distress.     Breath sounds: Normal breath sounds. No wheezing or rales.  Abdominal:     General: Bowel sounds are normal. There is no distension.     Palpations: Abdomen is soft. There is no mass.     Tenderness: There is abdominal tenderness.  Musculoskeletal:        General: No tenderness. Normal range of motion.     Cervical back:  Normal range of motion and neck supple.     Right lower leg: No edema.     Left lower leg: No edema.  Lymphadenopathy:     Cervical: No cervical adenopathy.  Skin:    General: Skin is warm and dry.     Findings: No erythema or rash.  Neurological:     Mental Status: She is alert.     Coordination: Coordination normal.  Psychiatric:        Behavior: Behavior normal.  ED Results / Procedures / Treatments   Labs (all labs ordered are listed, but only abnormal results are displayed) Labs Reviewed  URINALYSIS, ROUTINE W REFLEX MICROSCOPIC - Abnormal; Notable for the following components:      Result Value   APPearance CLOUDY (*)    Ketones, ur 5 (*)    Protein, ur 30 (*)    Leukocytes,Ua MODERATE (*)    Bacteria, UA RARE (*)    All other components within normal limits  COMPREHENSIVE METABOLIC PANEL - Abnormal; Notable for the following components:   Sodium 133 (*)    Potassium 3.2 (*)    Glucose, Bld 108 (*)    Calcium 8.1 (*)    Albumin 2.8 (*)    AST 90 (*)    Alkaline Phosphatase 137 (*)    All other components within normal limits  CBC WITH DIFFERENTIAL/PLATELET - Abnormal; Notable for the following components:   Hemoglobin 9.9 (*)    HCT 32.3 (*)    MCV 76.5 (*)    MCH 23.5 (*)    RDW 20.3 (*)    All other components within normal limits  LACTIC ACID, PLASMA  LACTIC ACID, PLASMA    EKG None  Radiology DG Chest Port 1 View  Result Date: 07/15/2022 CLINICAL DATA:  Patient to ED from home with possible UTI. Fever, dysuria and urinary frequency EXAM: PORTABLE CHEST 1 VIEW COMPARISON:  CT chest abdomen pelvis and radiograph 04/19/2022 FINDINGS: Stable enlargement of the cardiomediastinal silhouette. Pulmonary vascular congestion. No focal consolidation, pleural effusion, or pneumothorax. No displaced rib fractures. Spinal cord stimulator. Cervical spine fusion hardware. IMPRESSION: Cardiomegaly with pulmonary vascular congestion. Electronically Signed   By: Minerva Fester M.D.   On: 07/15/2022 19:04    Procedures Procedures    Medications Ordered in ED Medications  sodium chloride 0.9 % bolus 1,000 mL (has no administration in time range)  acetaminophen (TYLENOL) tablet 1,000 mg (has no administration in time range)  cefTRIAXone (ROCEPHIN) 2 g in sodium chloride 0.9 % 100 mL IVPB (has no administration in time range)  morphine (PF) 4 MG/ML injection 4 mg (has no administration in time range)    ED Course/ Medical Decision Making/ A&P                             Medical Decision Making Amount and/or Complexity of Data Reviewed Labs: ordered.  Risk OTC drugs. Prescription drug management.   This patient presents to the ED for concern of generalized weakness and likely bacteremia or sepsis, this involves an extensive number of treatment options, and is a complaint that carries with it a high risk of complications and morbidity.  The differential diagnosis includes sepsis, bacteremia, UTI   Co morbidities that complicate the patient evaluation  This patient is a diabetic, on insulin, on lisinopril and metformin as well as metoprolol.  She takes pain medications for some chronic pain   Additional history obtained:  Additional history obtained from electronic medical record External records from outside source obtained and reviewed including recent admission to the hospital for sepsis within the last year, this was from a urinary source   Lab Tests:  I Ordered, and personally interpreted labs.  The pertinent results include: Metabolic panel which is unremarkable except for mild hypokalemia, LFTs are borderline, CBC without leukocytosis, chronic anemia at 9.9, white blood cell count of 4.2, lactate of 1.2, urinalysis with bacteria and 21-50 white blood cells  Cardiac Monitoring: / EKG:  The patient was maintained on a cardiac monitor.  I personally viewed and interpreted the cardiac monitored which showed an underlying rhythm of:  Sinus tachycardia   Consultations Obtained:  I requested consultation with the hospitalist,  and discussed lab and imaging findings as well as pertinent plan - they recommend: Admission Discussed with Dr. Adrian Blackwater who has been kind enough to admit   Problem List / ED Course / Critical interventions / Medication management  Patient has what appears to be bacteremia or early sepsis from UTI source. I ordered medication including antibiotics for bacteremia Reevaluation of the patient after these medicines showed that the patient improved I have reviewed the patients home medicines and have made adjustments as needed   Social Determinants of Health:  Diabetic   Test / Admission - Considered:  Will admit         Final Clinical Impression(s) / ED Diagnoses Final diagnoses:  Bacteremia  Chronic anemia  Urinary tract infection without hematuria, site unspecified    Rx / DC Orders ED Discharge Orders     None         Eber Hong, MD 07/17/22 1736

## 2022-07-17 NOTE — ED Triage Notes (Signed)
Pt was instructed to come back to ED for + blood culture.  Pt still c/o of fevers-unknown. Pain to lower back and muscle spasms. Pt started an antibiotic PO yesterday, pt states she sent here for IV antibiotics.

## 2022-07-17 NOTE — H&P (Signed)
History and Physical    Patient: [name]:[id number] DOB: 12-08-1965 DOA: 07/17/2022 DOS: the patient was seen and examined on 07/17/2022 PCP: Elfredia Nevins, MD  Patient coming from: Home  Chief Complaint:  Chief Complaint  Patient presents with   Abnormal Lab   HPI: [name] is a 57 y.o. female with medical history significant of diabetes, asthma, hypertension, multiple sclerosis, chronic pain, sleep apnea.  Patient seen for positive blood cultures.  Patient was seen in the emergency department couple days ago and had UTI.  She was discharged to home on Keflex.  Prior to her leaving, she had blood cultures drawn.  Today the blood cultures came back positive.  Today, she does feel worse.  She continues to have frequency, dysuria, fevers, chills.  She does take Tylenol with mild improvement.  She does not feel like she is improving.  Review of Systems: As mentioned in the history of present illness. All other systems reviewed and are negative. 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 Norma Fredrickson at [facility]   Non-adherence to medical treatment 05/14/2009   Qualifier: Diagnosis of   By: Ricard Dillon       RECTAL BLEEDING 07/05/2009   Qualifier: Diagnosis of   By: Minna Merritts       Sleep apnea    Spondylosis of thoracic region without myelopathy or radiculopathy 07/02/2014   Uncontrolled type 2 diabetes mellitus with hyperglycemia 03/15/2017   Past Surgical History:  Procedure Laterality Date   ABDOMINAL HYSTERECTOMY     APPENDECTOMY     BACK SURGERY  03/07/2012   Nerve Stimulator   BIOPSY  04/23/2022   Procedure: BIOPSY;  Surgeon: Dolores Frame, MD;  [facility];  Service: Gastroenterology;;   CHOLECYSTECTOMY     COLONOSCOPY  07/09/2009   RMR; normal rectum aside from anal canal hemorrhoids/scattered left-sided diverticula   COLONOSCOPY WITH PROPOFOL N/A 04/23/2022   Procedure: COLONOSCOPY WITH PROPOFOL;  Surgeon: Dolores Frame, MD;  [facility];  Service: Gastroenterology;  Laterality: N/A;   ESOPHAGOGASTRODUODENOSCOPY  05/22/2009   RMR; normal /small HH   ESOPHAGOGASTRODUODENOSCOPY  2007   RMR: non-critical Schatzki's rin, non-maniuplated   ESOPHAGOGASTRODUODENOSCOPY N/A 10/24/2013   Procedure: ESOPHAGOGASTRODUODENOSCOPY (EGD);  Surgeon: Corbin Ade, MD;  [facility];  Service: Endoscopy;  Laterality: N/A;  9:45   ESOPHAGOGASTRODUODENOSCOPY  N/A 04/23/2022   Procedure: ESOPHAGOGASTRODUODENOSCOPY (EGD);  Surgeon: Marguerita Merles, Reuel Boom, MD;  [facility];  Service: Gastroenterology;  Laterality: N/A;   ESOPHAGOGASTRODUODENOSCOPY (EGD) WITH ESOPHAGEAL DILATION  03/17/2012   GNF:AOZHYQMV appearing esophageal mucosa of uncertain significance. Status post [name] dilation followed by esophageal bx   MALONEY DILATION N/A 10/24/2013   Procedure: [name] DILATION;  Surgeon: Corbin Ade, MD;  [facility];   Service: Endoscopy;  Laterality: N/A;   MANDIBLE SURGERY     NECK SURGERY     X2, last time 2013   Social History:  reports that she has never smoked. She has never used smokeless tobacco. She reports current alcohol use. She reports that she does not use drugs.  Allergies  Allergen Reactions   Peanut-Containing Drug Products Anaphylaxis and Hives    Patient states she is allergic to all nuts   Shellfish Allergy Anaphylaxis   Gluten Meal    Latex Other (See Comments)    Reaction: blistering   Other Other (See Comments)    Paper tape please   Prednisone Hives and Other (See Comments)    Reaction: causes psychological issues    Nexium [Esomeprazole Magnesium] Hives   Tape Rash    Paper tape please    Family History  Problem Relation Age of Onset   Cancer Mother        unknown type   Diabetes Father    Diabetes Sister    Colon cancer Neg Hx     Prior to Admission medications   Medication Sig Start Date End Date Taking? Authorizing Provider  ACCU-CHEK GUIDE test strip USE TO CHECK BLOOD SUGAR 4 TIMES DAILY AS DIRECTED 03/06/22   Nida, Denman George, MD  Accu-Chek Softclix Lancets lancets USE TO CHECK BLOOD SUGAR 4 TIMES DAILY AS DIRECTED 03/06/22   Nida, Denman George, MD  albuterol (ACCUNEB) 0.63 MG/3ML nebulizer solution Take 1 ampule by nebulization every 6 (six) hours as needed for wheezing or shortness of breath. 11/24/21   [provider]  albuterol (VENTOLIN HFA) 108 (90 Base) MCG/ACT inhaler Inhale 1-2 puffs into the lungs every 6 (six) hours as needed for wheezing or shortness of breath.  01/02/19   [provider]  baclofen (LIORESAL) 10 MG tablet Take 1 tablet (10 mg total) by mouth 3 (three) times daily. 07/15/22   Harris, Cammy Copa, PA-C  baclofen (LIORESAL) 20 MG tablet Take 20 mg by mouth 3 (three) times daily.  05/08/14   [provider]  blood glucose meter kit and supplies KIT Dispense based on patient and insurance preference. Use up to  four times daily as directed. (FOR ICD-9 250.00, 250.01). 03/15/19   Joseph Art, DO  Blood Glucose Monitoring Suppl (ACCU-CHEK GUIDE ME) w/Device KIT USE TO CHECK BLOOD SUGAR DAILY AS DIRECTED 12/31/21   Nida, Denman George, MD  cephALEXin (KEFLEX) 500 MG capsule 2 caps po bid x 7 days 07/15/22   Arthor Captain, PA-C  citalopram (CELEXA) 40 MG tablet Take 40 mg by mouth every evening.     [provider]  clopidogrel (PLAVIX) 75 MG tablet Take 75 mg by mouth daily. 11/27/21   [provider]  Continuous Blood Gluc Receiver (DEXCOM G7 RECEIVER) DEVI Use to check glucose as directed 10/28/21   Roma Kayser, MD  Continuous Blood Gluc Sensor (DEXCOM G7 SENSOR) MISC Change sensor every 10 days 01/19/22   Roma Kayser, MD  gabapentin (NEURONTIN) 800 MG tablet Take 800 mg by mouth  4 (four) times daily. 01/02/19   [provider]  insulin isophane & regular human KwikPen (NOVOLIN 70/30 KWIKPEN) (70-30) 100 UNIT/ML KwikPen Inject 10 Units into the skin 2 (two) times daily before a meal. 12/02/21   Erick Blinks, MD  Insulin Pen Needle (B-D ULTRAFINE III SHORT PEN) 31G X 8 MM MISC USE TO INJECT INSULIN 2 TIMES DAILY 06/19/22   Roma Kayser, MD  lisinopril (ZESTRIL) 5 MG tablet Take 1 tablet (5 mg total) by mouth daily. 04/25/22   Shon Hale, MD  metFORMIN (GLUCOPHAGE) 1000 MG tablet Take 1 tablet (1,000 mg total) by mouth 2 (two) times daily with a meal. 04/25/22   Emokpae, Courage, MD  metoprolol succinate (TOPROL-XL) 50 MG 24 hr tablet Take 1 tablet (50 mg total) by mouth daily. Take with or immediately following a meal. 04/25/22 04/20/23  Shon Hale, MD  ondansetron (ZOFRAN) 4 MG tablet Take 1 tablet (4 mg total) by mouth 4 (four) times daily as needed for nausea or vomiting. 04/25/22   Shon Hale, MD  oxycodone (ROXICODONE) 30 MG immediate release tablet Take 15 mg by mouth every 6 (six) hours as needed for pain. 10/31/21   [provider]  pantoprazole (PROTONIX) 40 MG tablet Take 1 tablet (40 mg total) by mouth 2 (two) times daily. 04/25/22 04/25/23  Shon Hale, MD  pravastatin (PRAVACHOL) 40 MG tablet Take 1 tablet (40 mg total) by mouth every evening. 03/15/19   Black, Lesle Chris, NP  rOPINIRole (REQUIP) 0.5 MG tablet Take 0.5 mg by mouth at bedtime. 11/27/21   [provider]  sucralfate (CARAFATE) 1 g tablet Take 1 tablet (1 g total) by mouth 4 (four) times daily. 04/25/22 04/25/23  Shon Hale, MD    Physical Exam: Vitals:   07/17/22 1358 07/17/22 1619 07/17/22 1700  BP: (!) 157/76  (!) 145/66  Pulse: (!) 101  100  Resp: 16  16  Temp: 99.4 F (37.4 C) 99.2 F (37.3 C) 99.5 F (37.5 C)  TempSrc: Oral Oral Oral  SpO2: 99%  97%  Weight:   81.8 kg  Height:    (1.651 m)   General: Middle-age female. Awake and alert and oriented x3. No acute cardiopulmonary distress.  HEENT: Normocephalic atraumatic.  Right and left ears normal in appearance.  Pupils equal, round, reactive to light. Extraocular muscles are intact. Sclerae anicteric and noninjected.  Moist mucosal membranes. No mucosal lesions.  Neck: Neck supple without lymphadenopathy. No carotid bruits. No masses palpated.  Cardiovascular: Regular rate with normal S1-S2 sounds. No murmurs, rubs, gallops auscultated. No JVD.  Respiratory: Good respiratory effort with no wheezes, rales, rhonchi. Lungs clear to auscultation bilaterally.  No accessory muscle use. Abdomen: Soft, mild suprapubic tenderness, nondistended. Active bowel sounds. No masses or hepatosplenomegaly.  She does have some lower back, but no CVA tenderness Skin: No rashes, lesions, or ulcerations.  Dry, warm to touch. 2+ dorsalis pedis and radial pulses. Musculoskeletal: No calf or leg pain. All major joints not erythematous nontender.  No upper or lower joint deformation.  Good ROM.  No contractures  Psychiatric: Intact judgment and insight. Pleasant and  cooperative. Neurologic: No focal neurological deficits. Strength is 5/5 and symmetric in upper and lower extremities.  Cranial nerves II through XII are grossly intact.  Data Reviewed: Results for orders placed or performed during the hospital encounter of 07/17/22 (from the past 24 hour(s))  Urinalysis, Routine w reflex microscopic -Urine, Clean Catch     Status: Abnormal  Collection Time: 07/17/22  2:05 PM  Result Value Ref Range   Color, Urine YELLOW YELLOW   APPearance CLOUDY (A) CLEAR   Specific Gravity, Urine 1.019 1.005 - 1.030   pH 6.0 5.0 - 8.0   Glucose, UA NEGATIVE NEGATIVE mg/dL   Hgb urine dipstick NEGATIVE NEGATIVE   Bilirubin Urine NEGATIVE NEGATIVE   Ketones, ur 5 (A) NEGATIVE mg/dL   Protein, ur 30 (A) NEGATIVE mg/dL   Nitrite NEGATIVE NEGATIVE   Leukocytes,Ua MODERATE (A) NEGATIVE   RBC / HPF 0-5 0 - 5 RBC/hpf   WBC, UA 21-50 0 - 5 WBC/hpf   Bacteria, UA RARE (A) NONE SEEN   Squamous Epithelial / HPF 21-50 0 - 5 /HPF   Mucus PRESENT    Hyaline Casts, UA PRESENT   Comprehensive metabolic panel     Status: Abnormal   Collection Time: 07/17/22  2:39 PM  Result Value Ref Range   Sodium 133 (L) 135 - 145 mmol/L   Potassium 3.2 (L) 3.5 - 5.1 mmol/L   Chloride 99 98 - 111 mmol/L   CO2 26 22 - 32 mmol/L   Glucose, Bld 108 (H) 70 - 99 mg/dL   BUN 14 6 - 20 mg/dL   Creatinine, Ser 4.09 0.44 - 1.00 mg/dL   Calcium 8.1 (L) 8.9 - 10.3 mg/dL   Total Protein 8.1 6.5 - 8.1 g/dL   Albumin 2.8 (L) 3.5 - 5.0 g/dL   AST 90 (H) 15 - 41 U/L   ALT 39 0 - 44 U/L   Alkaline Phosphatase 137 (H) 38 - 126 U/L   Total Bilirubin 0.3 0.3 - 1.2 mg/dL   GFR, Estimated >81 >19 mL/min   Anion gap 8 5 - 15  CBC with Differential     Status: Abnormal   Collection Time: 07/17/22  2:39 PM  Result Value Ref Range   WBC 4.4 4.0 - 10.5 K/uL   RBC 4.22 3.87 - 5.11 MIL/uL   Hemoglobin 9.9 (L) 12.0 - 15.0 g/dL   HCT 14.7 (L) 82.9 - 56.2 %   MCV 76.5 (L) 80.0 - 100.0 fL   MCH 23.5 (L) 26.0  - 34.0 pg   MCHC 30.7 30.0 - 36.0 g/dL   RDW 13.0 (H) 86.5 - 78.4 %   Platelets 184 150 - 400 K/uL   nRBC 0.0 0.0 - 0.2 %   Neutrophils Relative % 63 %   Neutro Abs 2.8 1.7 - 7.7 K/uL   Lymphocytes Relative 22 %   Lymphs Abs 1.0 0.7 - 4.0 K/uL   Monocytes Relative 12 %   Monocytes Absolute 0.6 0.1 - 1.0 K/uL   Eosinophils Relative 1 %   Eosinophils Absolute 0.0 0.0 - 0.5 K/uL   Basophils Relative 1 %   Basophils Absolute 0.0 0.0 - 0.1 K/uL   Immature Granulocytes 1 %   Abs Immature Granulocytes 0.02 0.00 - 0.07 K/uL  Lactic acid, plasma     Status: None   Collection Time: 07/17/22  2:39 PM  Result Value Ref Range   Lactic Acid, Venous 1.2 0.5 - 1.9 mmol/L  Lactic acid, plasma     Status: None   Collection Time: 07/17/22  5:30 PM  Result Value Ref Range   Lactic Acid, Venous 1.2 0.5 - 1.9 mmol/L    US RENAL  Result Date: 07/17/2022 CLINICAL DATA:  Pyelonephritis. EXAM: RENAL / URINARY TRACT ULTRASOUND COMPLETE COMPARISON:  CT dated 04/19/2022. FINDINGS: Evaluation is limited due to body habitus and overlying  bowel gas. Right Kidney: Renal measurements: 11.8 x 5.9 x 7.0 cm = volume: 256 mL. Normal echogenicity. Faint 7 mm echogenic focus in the interpolar right kidney may be artifactual or represent a nonobstructing calculus. No hydronephrosis. Left Kidney: Renal measurements: 9.4 x 4.9 x 4.7 cm = volume: 1 per T mL. Normal echogenicity. No hydronephrosis or shadowing stone. Bladder: Appears normal for degree of bladder distention. Other: None. IMPRESSION: Artifact versus a 7 mm nonobstructing right renal interpolar calculus. No hydronephrosis. Electronically Signed   By: Elgie Collard M.D.   On: 07/17/2022 17:21     Assessment and Plan: No notes have been filed under this hospital service. Service: Hospitalist  Principal Problem:   Bacteremia Active Problems:   Multiple sclerosis (HCC)   Essential hypertension, benign   DM type 2 causing vascular disease   Back pain,  chronic   Hypokalemia   UTI (urinary tract infection)  UTI with bacteremia Rocephin Blood cultures redrawn Urine cultures CBC tomorrow Serum creatinine normal Lactic acid normal Hypertension Continue antihypertensives Hypokalemia Replace potassium and recheck potassium in the morning Back pain Continue pain meds This appears to be lumbar pain -no acute CVA tenderness.  No need for renal ultrasound Type 2 diabetes Sliding scale insulin and Lantus MS No current symptoms   Advance Care Planning:   Code Status: Full Code confirmed by patient  Consults: none  Family Communication: none  Severity of Illness: The appropriate patient status for this patient is INPATIENT. Inpatient status is judged to be reasonable and necessary in order to provide the required intensity of service to ensure the patient's safety. The patient's presenting symptoms, physical exam findings, and initial radiographic and laboratory data in the context of their chronic comorbidities is felt to place them at high risk for further clinical deterioration. Furthermore, it is not anticipated that the patient will be medically stable for discharge from the hospital within 2 midnights of admission.   * I certify that at the point of admission it is my clinical judgment that the patient will require inpatient hospital care spanning beyond 2 midnights from the point of admission due to high intensity of service, high risk for further deterioration and high frequency of surveillance required.*  Author: Levie Heritage, DO 07/17/2022 6:52 PM  For on call review [email or web address].

## 2022-07-18 DIAGNOSIS — E1159 Type 2 diabetes mellitus with other circulatory complications: Secondary | ICD-10-CM

## 2022-07-18 DIAGNOSIS — I10 Essential (primary) hypertension: Secondary | ICD-10-CM | POA: Diagnosis not present

## 2022-07-18 DIAGNOSIS — E876 Hypokalemia: Secondary | ICD-10-CM | POA: Diagnosis not present

## 2022-07-18 DIAGNOSIS — R7881 Bacteremia: Secondary | ICD-10-CM | POA: Diagnosis not present

## 2022-07-18 LAB — GLUCOSE, CAPILLARY
Glucose-Capillary: 97 mg/dL (ref 70–99)
Glucose-Capillary: 116 mg/dL — ABNORMAL HIGH (ref 70–99)
Glucose-Capillary: 150 mg/dL — ABNORMAL HIGH (ref 70–99)
Glucose-Capillary: 176 mg/dL — ABNORMAL HIGH (ref 70–99)

## 2022-07-18 LAB — BASIC METABOLIC PANEL
Anion gap: 6 (ref 5–15)
BUN: 15 mg/dL (ref 6–20)
CO2: 27 mmol/L (ref 22–32)
Calcium: 7.8 mg/dL — ABNORMAL LOW (ref 8.9–10.3)
Chloride: 103 mmol/L (ref 98–111)
Creatinine, Ser: 0.79 mg/dL (ref 0.44–1.00)
GFR, Estimated: >60 mL/min (ref >60)
Glucose, Bld: 99 mg/dL (ref 70–99)
Potassium: 3.9 mmol/L (ref 3.5–5.1)
Sodium: 136 mmol/L (ref 135–145)

## 2022-07-18 LAB — URINE CULTURE: Culture: >=100000 — AB

## 2022-07-18 LAB — CBC
HCT: 30.1 % — ABNORMAL LOW (ref 36.0–46.0)
Hemoglobin: 9.1 g/dL — ABNORMAL LOW (ref 12.0–15.0)
MCH: 23.8 pg — ABNORMAL LOW (ref 26.0–34.0)
MCHC: 30.2 g/dL (ref 30.0–36.0)
MCV: 78.6 fL — ABNORMAL LOW (ref 80.0–100.0)
Platelets: 183 10*3/uL (ref 150–400)
RBC: 3.83 MIL/uL — ABNORMAL LOW (ref 3.87–5.11)
RDW: 20.1 % — ABNORMAL HIGH (ref 11.5–15.5)
WBC: 3.8 10*3/uL — ABNORMAL LOW (ref 4.0–10.5)
nRBC: 0 % (ref 0.0–0.2)

## 2022-07-18 LAB — CULTURE, BLOOD (ROUTINE X 2): Special Requests: ADEQUATE

## 2022-07-18 LAB — HEMOGLOBIN A1C
Hgb A1c MFr Bld: 7.1 % — ABNORMAL HIGH (ref 4.8–5.6)
Mean Plasma Glucose: 157.07 mg/dL

## 2022-07-18 MED ORDER — GABAPENTIN 400 MG PO CAPS
800.0000 mg | ORAL_CAPSULE | Freq: Four times a day (QID) | ORAL | Status: DC
  Administered 2022-07-18 – 2022-07-20 (×7): 800 mg via ORAL
  Filled 2022-07-18 (×7): qty 2

## 2022-07-18 NOTE — Progress Notes (Signed)
[facility]  PROGRESS NOTE  [name] ZOX:[id number] DOB: [birth year] DOA: 07/17/2022 PCP: Debbie Nevins, MD   Brief HPI:   57 y.o. female with medical history significant of diabetes, asthma, hypertension, multiple sclerosis, chronic pain, sleep apnea.  Patient seen for positive blood cultures.  Patient was seen in the emergency department couple days ago and had UTI.  She was discharged to home on Keflex.  Prior to her leaving, she had blood cultures drawn.  the blood cultures came back positive.  She was called from home for admission.  Started on IV Rocephin.   Subjective   Patient seen and examined, denies dysuria or abdominal pain.   Assessment/Plan:     Bacteremia/UTI -Blood cultures from 4/17 growing E. coli, sensitive to IV Rocephin -Repeat blood cultures drawn yesterday showed no growth till date -Continue IV Rocephin  UTI -Urine culture from 4/17 grew E. Coli -Sensitive to Rocephin as above  Hypertension -Blood pressure is stable -Continue metoprolol  Hypokalemia -Replete  Diabetes mellitus type 2 -Continue sliding scale insulin with NovoLog -Continue Lantus 10 units subcu twice daily -CBG well-controlled  Multiple sclerosis -Symptoms well-controlled -Continue baclofen   Medications     baclofen  10 mg Oral TID   citalopram  40 mg Oral QPM   clopidogrel  75 mg Oral Daily   enoxaparin (LOVENOX) injection  40 mg Subcutaneous Q24H   insulin aspart  0-15 Units Subcutaneous TID WC   insulin aspart  0-5 Units Subcutaneous QHS   insulin glargine-yfgn  10 Units Subcutaneous BID   metoprolol succinate  50 mg Oral Daily   pantoprazole  40 mg Oral BID   potassium chloride  40 mEq Oral BID   pravastatin  40 mg Oral QPM   rOPINIRole  0.5 mg Oral QHS     Data Reviewed:   CBG:  Recent Labs  Lab 07/17/22 2126 07/18/22 0748 07/18/22 1149 07/18/22 1616  GLUCAP 206* 97 116* 150*    SpO2: 96 % O2 Flow Rate (L/min): 0 L/min FiO2 (%): 21 %     Vitals:   07/17/22 2128 07/18/22 0104 07/18/22 0451 07/18/22 1513  BP: (!) 114/53 138/69 (!) 143/73 (!) 129/56  Pulse: 82 73 78 68  Resp: Temp: 98.7 F (37.1 C) 98.6 F (37 C) 98.8 F (37.1 C) 98.9 F (37.2 C)  TempSrc: Oral Oral Oral Oral  SpO2: 98% 98% 96% 96%  Weight:      Height:          Data Reviewed:  Basic Metabolic Panel: Recent Labs  Lab 07/15/22 1610 07/17/22 1439 07/18/22 0340  NA 134* 133* 136  K 3.7 3.2* 3.9  CL 101 99 103  CO2 GLUCOSE 127* 108* 99  BUN CREATININE 0.83 0.85 0.79  CALCIUM 8.0* 8.1* 7.8*    CBC: Recent Labs  Lab 07/15/22 1610 07/17/22 1439 07/18/22 0340  WBC 7.9 4.4 3.8*  NEUTROABS 6.2 2.8  --   HGB 9.7* 9.9* 9.1*  HCT 31.4* 32.3* 30.1*  MCV 77.0* 76.5* 78.6*  PLT 173 184 183    LFT Recent Labs  Lab 07/15/22 1610 07/17/22 1439  AST 122* 90*  ALT 42 39  ALKPHOS 123 137*  BILITOT 0.7 0.3  PROT 7.7 8.1  ALBUMIN 2.8* 2.8*     Antibiotics: Anti-infectives (From admission, onward)    Start     Dose/Rate Route Frequency Ordered Stop   07/18/22 1600  cefTRIAXone (  ROCEPHIN) 2 g in sodium chloride 0.9 % 100 mL IVPB        2 g 200 mL/hr over 30 Minutes Intravenous  Once 07/17/22 1928     07/17/22 1600  cefTRIAXone (ROCEPHIN) 2 g in sodium chloride 0.9 % 100 mL IVPB        2 g 200 mL/hr over 30 Minutes Intravenous  Once 07/17/22 1558 07/17/22 1646        DVT prophylaxis: Lovenox  Code Status: Full code  Family Communication: No family at bedside   CONSULTS    Objective    Physical Examination:   General-appears in no acute distress Heart-S1-S2, regular, no murmur auscultated Lungs-clear to auscultation bilaterally, no wheezing or crackles auscultated Abdomen-soft, nontender, no organomegaly Extremities-no edema in the lower extremities Neuro-alert, oriented x3, no focal deficit noted   Status is: Inpatient:             [name]   [facility] If 7PM-7AM, please contact night-coverage at [email or web address], Office  [phone]   07/18/2022, 4:44 PM  LOS: 1 day

## 2022-07-18 NOTE — TOC Initial Note (Signed)
Transition of Care [facility]) - Initial/Assessment Note    Patient Details  Name: [name] MRN: [id number] Date of Birth: Jan 17, 1966  Transition of Care (TOC) CM/SW Contact:    Catalina Gravel, LCSW Phone Number: 07/18/2022, 4:52 PM  Clinical Narrative:                  .. Transition of Care [facility]) Screening Note   Patient Details  Name: [name] Date of Birth: 05-28-1965   Transition of Care (TOC) CM/SW Contact:    Catalina Gravel, LCSW Phone Number: 07/18/2022, 4:52 PM    Transition of Care Department [facility]) has reviewed patient and no TOC needs have been identified at this time. We will continue to monitor patient advancement through interdisciplinary progression rounds. If new patient transition needs arise, please place a TOC consult.     Barriers to Discharge: Continued Medical Work up   Patient Goals and CMS Choice            Expected Discharge Plan and Services                                              Prior Living Arrangements/Services                       Activities of Daily Living Home Assistive Devices/Equipment: None ADL Screening (condition at time of admission) Patient's cognitive ability adequate to safely complete daily activities?: Yes Is the patient deaf or have difficulty hearing?: No Does the patient have difficulty seeing, even when wearing glasses/contacts?: No Does the patient have difficulty concentrating, remembering, or making decisions?: No Patient able to express need for assistance with ADLs?: Yes Does the patient have difficulty dressing or bathing?: No Independently performs ADLs?: Yes (appropriate for developmental age) Does the patient have difficulty walking or climbing stairs?: No Weakness of Legs: None Weakness of Arms/Hands: None  Permission Sought/Granted                  Emotional Assessment              Admission diagnosis:  Bacteremia [R78.81] Chronic anemia  [D64.9] Urinary tract infection without hematuria, site unspecified [N39.0] Patient Active Problem List   Diagnosis Date Noted   Bacteremia 07/17/2022   UTI (urinary tract infection) 07/17/2022   Hypercholesterolemia 07/15/2022   Iron deficiency anemia due to chronic blood loss 04/22/2022   Sepsis 04/20/2022   Elevated troponin 04/20/2022   Gram-negative bacteremia 11/29/2021   Severe sepsis 11/28/2021   Hypokalemia 11/28/2021   AKI (acute kidney injury) 11/28/2021   LFT elevation 11/28/2021   Subclinical hyperthyroidism 03/27/2020   Closed nondisplaced fracture of proximal phalanx of lesser toe of right foot 12/27/2019   Fatty liver 12/07/2019   DM type 2 causing vascular disease 04/11/2019   Facial droop 03/12/2019   Vitamin D deficiency 08/26/2017   Uncontrolled type 2 diabetes mellitus with hyperglycemia 03/15/2017   Essential hypertension, benign 03/15/2017   Class 2 severe obesity due to excess calories with serious comorbidity and body mass index (BMI) of 35.0 to 35.9 in adult 03/15/2017   Spondylosis of thoracic region without myelopathy or radiculopathy 07/02/2014   Helicobacter pylori (H. 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 04/16/2012   Dysphagia 02/23/2012   Back pain, chronic 01/11/2012   Thoracic radiculopathy 01/11/2012   Chronic pain syndrome 01/11/2012   Occipital neuralgia 01/06/2012   Depression 05/07/2011   Insomnia 05/07/2011   Sleep apnea 05/07/2011   Cervical spondylosis 12/23/2010   FATTY LIVER DISEASE 05/16/2009   HELICOBACTER PYLORI GASTRITIS 05/14/2009   ASTHMA 05/14/2009    Esophageal reflux 05/14/2009   NAUSEA 05/14/2009   Diarrhea 05/14/2009   DEPRESSION, HX OF 05/14/2009   Non-adherence to medical treatment 05/14/2009   PCP:  Elfredia Nevins, MD Pharmacy:   [facility] [place], [place] - [address] [place] [address] Phone: [phone] Fax: [phone]  [facility] - [place], [place] - [facility] AT Portal to Registered [facility] [address] [place] [address] Phone: [phone] Fax: [phone]     Social Determinants of Health (SDOH) Social History: SDOH Screenings   Food Insecurity: No Food Insecurity (07/17/2022)  Recent Concern: Food Insecurity - Food Insecurity Present (04/20/2022)  Housing: Low Risk  (07/17/2022)  Transportation Needs: No Transportation Needs (07/17/2022)  Utilities: Not At Risk (07/17/2022)  Depression (PHQ2-9): Low Risk  (04/07/2018)  Tobacco Use: Low Risk  (07/17/2022)   SDOH Interventions:     Readmission Risk Interventions     No data to display

## 2022-07-18 NOTE — Progress Notes (Signed)
Patient slept most of the night. Two prn medications given. Patient gets up independently to the restroom. Plan of care ongoing.

## 2022-07-19 ENCOUNTER — Telehealth (HOSPITAL_BASED_OUTPATIENT_CLINIC_OR_DEPARTMENT_OTHER): Payer: Self-pay | Admitting: *Deleted

## 2022-07-19 DIAGNOSIS — E1159 Type 2 diabetes mellitus with other circulatory complications: Secondary | ICD-10-CM | POA: Diagnosis not present

## 2022-07-19 DIAGNOSIS — R7881 Bacteremia: Secondary | ICD-10-CM | POA: Diagnosis not present

## 2022-07-19 DIAGNOSIS — E876 Hypokalemia: Secondary | ICD-10-CM | POA: Diagnosis not present

## 2022-07-19 DIAGNOSIS — I10 Essential (primary) hypertension: Secondary | ICD-10-CM | POA: Diagnosis not present

## 2022-07-19 LAB — CULTURE, BLOOD (ROUTINE X 2)

## 2022-07-19 LAB — GLUCOSE, CAPILLARY
Glucose-Capillary: 127 mg/dL — ABNORMAL HIGH (ref 70–99)
Glucose-Capillary: 158 mg/dL — ABNORMAL HIGH (ref 70–99)
Glucose-Capillary: 113 mg/dL — ABNORMAL HIGH (ref 70–99)
Glucose-Capillary: 254 mg/dL — ABNORMAL HIGH (ref 70–99)

## 2022-07-19 MED ORDER — SODIUM CHLORIDE 0.9 % IV SOLN
2.0000 g | INTRAVENOUS | Status: DC
  Administered 2022-07-19: 2 g via INTRAVENOUS
  Filled 2022-07-19: qty 20

## 2022-07-19 NOTE — Progress Notes (Signed)
[facility]  PROGRESS NOTE  [name] ZOX:[id number] DOB: [birth year] DOA: 07/17/2022 PCP: Elfredia Nevins, MD   Brief HPI:   57 y.o. female with medical history significant of diabetes, asthma, hypertension, multiple sclerosis, chronic pain, sleep apnea.  Patient seen for positive blood cultures.  Patient was seen in the emergency department couple days ago and had UTI.  She was discharged to home on Keflex.  Prior to her leaving, she had blood cultures drawn.  the blood cultures came back positive.  She was called from home for admission.  Started on IV Rocephin.   Subjective   Patient seen and examined, denies abdominal pain.   Assessment/Plan:     Bacteremia/UTI -Blood cultures from 4/17 growing E. coli, sensitive to IV Rocephin -Called from home for admission -Repeat blood cultures drawn from 4/19 showed no growth till date.   -Continue IV Rocephin  UTI -Urine culture from 4/17 grew E. Coli -Sensitive to Rocephin as above  Hypertension -Blood pressure is stable -Continue metoprolol  Hypokalemia -Replete  Diabetes mellitus type 2 -Continue sliding scale insulin with NovoLog -Continue Lantus 10 units subcu twice daily -CBG well-controlled  Multiple sclerosis -Symptoms well-controlled -Continue baclofen   Medications     baclofen  10 mg Oral TID   citalopram  40 mg Oral QPM   clopidogrel  75 mg Oral Daily   enoxaparin (LOVENOX) injection  40 mg Subcutaneous Q24H   gabapentin  800 mg Oral QID   insulin aspart  0-15 Units Subcutaneous TID WC   insulin aspart  0-5 Units Subcutaneous QHS   insulin glargine-yfgn  10 Units Subcutaneous BID   metoprolol succinate  50 mg Oral Daily   pantoprazole  40 mg Oral BID   pravastatin  40 mg Oral QPM   rOPINIRole  0.5 mg Oral QHS     Data Reviewed:   CBG:  Recent Labs  Lab 07/18/22 1149 07/18/22 1616 07/18/22 2043 07/19/22 0752 07/19/22 1153  GLUCAP 116* 150* 176* 127* 158*    SpO2: 98 % O2 Flow  Rate (L/min): 0 L/min FiO2 (%): 21 %    Vitals:   07/18/22 1513 07/18/22 2000 07/19/22 0429 07/19/22 0800  BP: (!) 129/56 (!) 142/61 (!) 147/71 132/69  Pulse: 68 66 76 74  Resp: Temp: 98.9 F (37.2 C) 98.4 F (36.9 C) 98.5 F (36.9 C)   TempSrc: Oral     SpO2: 96% 99% 98%   Weight:      Height:          Data Reviewed:  Basic Metabolic Panel: Recent Labs  Lab 07/15/22 1610 07/17/22 1439 07/18/22 0340  NA 134* 133* 136  K 3.7 3.2* 3.9  CL 101 99 103  CO2 GLUCOSE 127* 108* 99  BUN CREATININE 0.83 0.85 0.79  CALCIUM 8.0* 8.1* 7.8*    CBC: Recent Labs  Lab 07/15/22 1610 07/17/22 1439 07/18/22 0340  WBC 7.9 4.4 3.8*  NEUTROABS 6.2 2.8  --   HGB 9.7* 9.9* 9.1*  HCT 31.4* 32.3* 30.1*  MCV 77.0* 76.5* 78.6*  PLT 173 184 183    LFT Recent Labs  Lab 07/15/22 1610 07/17/22 1439  AST 122* 90*  ALT 42 39  ALKPHOS 123 137*  BILITOT 0.7 0.3  PROT 7.7 8.1  ALBUMIN 2.8* 2.8*     Antibiotics: Anti-infectives (From admission, onward)    Start     Dose/Rate Route Frequency Ordered Stop  07/18/22 1600  cefTRIAXone (ROCEPHIN) 2 g in sodium chloride 0.9 % 100 mL IVPB        2 g 200 mL/hr over 30 Minutes Intravenous  Once 07/17/22 1928 07/18/22 1744   07/17/22 1600  cefTRIAXone (ROCEPHIN) 2 g in sodium chloride 0.9 % 100 mL IVPB        2 g 200 mL/hr over 30 Minutes Intravenous  Once 07/17/22 1558 07/17/22 1646        DVT prophylaxis: Lovenox  Code Status: Full code  Family Communication: No family at bedside   CONSULTS    Objective    Physical Examination:   General-appears in no acute distress Heart-S1-S2, regular, no murmur auscultated Lungs-clear to auscultation bilaterally, no wheezing or crackles auscultated Abdomen-soft, nontender, no organomegaly Extremities-no edema in the lower extremities Neuro-alert, oriented x3, no focal deficit noted   Status is: Inpatient:             [name]   [facility] If 7PM-7AM, please contact night-coverage at [email or web address], Office  [phone]   07/19/2022, 1:02 PM  LOS: 2 days

## 2022-07-19 NOTE — Telephone Encounter (Signed)
Post ED Visit - Positive Culture Follow-up  Culture report reviewed by antimicrobial stewardship pharmacist: [facility]  Enzo Bi, Pharm.D.  Celedonio Miyamoto, Pharm.D., BCPS AQ-ID  Garvin Fila, Pharm.D., BCPS  Georgina Pillion, [address].D., BCPS  [place], [address].D., BCPS, AAHIVP  Estella Husk, Pharm.D., BCPS, AAHIVP  Lysle Pearl, PharmD, BCPS  Phillips Climes, PharmD, BCPS  Agapito Games, PharmD, BCPS  Verlan Friends, PharmD  Mervyn Gay, PharmD, BCPS  Daylene Posey PharmD  [facility]  Len Childs, PharmD  Greer Pickerel, PharmD  Adalberto Cole, PharmD  Perlie Gold, Rph  Lonell Face) Jean Rosenthal, PharmD  Earl Many, PharmD  Junita Push, PharmD  Dorna Leitz, PharmD  Terrilee Files, PharmD  Lynann Beaver, PharmD  Keturah Barre, PharmD  Loralee Pacas, PharmD  Bernadene Person, PharmD   Positive blood & urine culture Treated with Cephalexin. Pt currently admitted at [facility] for treatment  Patsey Berthold 07/19/2022, 11:36 AM

## 2022-07-20 ENCOUNTER — Telehealth (HOSPITAL_BASED_OUTPATIENT_CLINIC_OR_DEPARTMENT_OTHER): Payer: Self-pay | Admitting: *Deleted

## 2022-07-20 DIAGNOSIS — R7881 Bacteremia: Secondary | ICD-10-CM | POA: Diagnosis not present

## 2022-07-20 LAB — GLUCOSE, CAPILLARY: Glucose-Capillary: 98 mg/dL (ref 70–99)

## 2022-07-20 LAB — CULTURE, BLOOD (ROUTINE X 2): Culture: NO GROWTH

## 2022-07-20 MED ORDER — CIPROFLOXACIN HCL 500 MG PO TABS: 500.0000 mg | ORAL_TABLET | Freq: Two times a day (BID) | ORAL | 0 refills | Status: AC

## 2022-07-20 NOTE — Discharge Summary (Signed)
Physician Discharge Summary  [name] GMW:[id number] DOB: [birth year] DOA: 07/17/2022  PCP: Elfredia Nevins, MD  Admit date: 07/17/2022  Discharge date: 07/20/2022  Admitted From:Home  Disposition:  Home  Recommendations for Outpatient Follow-up:  Follow up with PCP in 1-2 weeks Continue on ciprofloxacin as prescribed for 3 more days to complete total 7-day course of treatment for E. coli bacteremia Continue other home medications as prior  Home Health: None  Equipment/Devices: None  Discharge Condition:Stable  CODE STATUS: Full  Diet recommendation: Heart Healthy/carb modified  Brief/Interim Summary: 57 y.o. female with medical history significant of diabetes, asthma, hypertension, multiple sclerosis, chronic pain, sleep apnea.  Patient seen for positive blood cultures.  Patient was seen in the emergency department couple days ago and had UTI.  She was discharged to home on Keflex.  Prior to her leaving, she had blood cultures drawn.  the blood cultures came back positive.  She was called from home for admission.  Started on IV Rocephin.  Her blood cultures were noted to be positive for E. coli that was pansensitive and repeat blood cultures have demonstrated no growth in the last 3 days.  She is currently in stable condition for discharge on oral ciprofloxacin as prescribed for 3 more days to complete total 7-day course of treatment.  No other acute events noted throughout this hospitalization.  Discharge Diagnoses:  Principal Problem:   Bacteremia Active Problems:   Multiple sclerosis (HCC)   Essential hypertension, benign   DM type 2 causing vascular disease   Back pain, chronic   Hypokalemia   UTI (urinary tract infection)  Principal discharge diagnosis: E. coli bacteremia in the setting of recent UTI.  Discharge Instructions  Discharge Instructions     Diet - low sodium heart healthy   Complete by: As directed    Increase activity slowly   Complete by: As  directed       Allergies as of 07/20/2022       Reactions   Peanut-containing Drug Products Anaphylaxis, Hives   Patient states she is allergic to all nuts   Shellfish Allergy Anaphylaxis   Gluten Meal    Latex Other (See Comments)   Reaction: blistering   Other Other (See Comments)   Paper tape please   Prednisone Hives, Other (See Comments)   Reaction: causes psychological issues    Nexium [esomeprazole Magnesium] Hives   Tape Rash   Paper tape please        Medication List     STOP taking these medications    cephALEXin 500 MG capsule Commonly known as: Keflex       TAKE these medications    Accu-Chek Guide Me w/Device Kit USE TO CHECK BLOOD SUGAR DAILY AS DIRECTED   Accu-Chek Guide test strip Generic drug: glucose blood USE TO CHECK BLOOD SUGAR 4 TIMES DAILY AS DIRECTED   Accu-Chek Softclix Lancets lancets USE TO CHECK BLOOD SUGAR 4 TIMES DAILY AS DIRECTED   acetaminophen 500 MG tablet Commonly known as: TYLENOL Take 1,000 mg by mouth every 6 (six) hours as needed for moderate pain.   albuterol 108 (90 Base) MCG/ACT inhaler Commonly known as: VENTOLIN HFA Inhale 1-2 puffs into the lungs every 6 (six) hours as needed for wheezing or shortness of breath.   albuterol 0.63 MG/3ML nebulizer solution Commonly known as: ACCUNEB Take 1 ampule by nebulization every 6 (six) hours as needed for wheezing or shortness of breath.   B-D ULTRAFINE III SHORT PEN 31G X 8 MM  Misc Generic drug: Insulin Pen Needle USE TO INJECT INSULIN 2 TIMES DAILY   baclofen 20 MG tablet Commonly known as: LIORESAL Take 20 mg by mouth 3 (three) times daily.   baclofen 10 MG tablet Commonly known as: LIORESAL Take 1 tablet (10 mg total) by mouth 3 (three) times daily.   blood glucose meter kit and supplies Kit Dispense based on patient and insurance preference. Use up to four times daily as directed. (FOR ICD-9 250.00, 250.01).   ciprofloxacin 500 MG tablet Commonly known  as: Cipro Take 1 tablet (500 mg total) by mouth 2 (two) times daily for 3 days.   citalopram 40 MG tablet Commonly known as: CELEXA Take 40 mg by mouth every evening.   clopidogrel 75 MG tablet Commonly known as: PLAVIX Take 75 mg by mouth daily.   Dexcom G7 Receiver Devi Use to check glucose as directed   Dexcom G7 Sensor Misc Change sensor every 10 days   gabapentin 800 MG tablet Commonly known as: NEURONTIN Take 800 mg by mouth 4 (four) times daily.   lisinopril 5 MG tablet Commonly known as: ZESTRIL Take 1 tablet (5 mg total) by mouth daily.   metFORMIN 1000 MG tablet Commonly known as: GLUCOPHAGE Take 1 tablet (1,000 mg total) by mouth 2 (two) times daily with a meal.   metoprolol succinate 50 MG 24 hr tablet Commonly known as: TOPROL-XL Take 1 tablet (50 mg total) by mouth daily. Take with or immediately following a meal.   NovoLIN 70/30 Kwikpen (70-30) 100 UNIT/ML KwikPen Generic drug: insulin isophane & regular human KwikPen Inject 10 Units into the skin 2 (two) times daily before a meal. What changed: how much to take   ondansetron 4 MG tablet Commonly known as: ZOFRAN Take 1 tablet (4 mg total) by mouth 4 (four) times daily as needed for nausea or vomiting.   oxycodone 30 MG immediate release tablet Commonly known as: ROXICODONE Take 15 mg by mouth every 6 (six) hours as needed for pain.   pantoprazole 40 MG tablet Commonly known as: Protonix Take 1 tablet (40 mg total) by mouth 2 (two) times daily.   pravastatin 40 MG tablet Commonly known as: PRAVACHOL Take 1 tablet (40 mg total) by mouth every evening.   rOPINIRole 0.5 MG tablet Commonly known as: REQUIP Take 0.5 mg by mouth at bedtime.   sucralfate 1 g tablet Commonly known as: Carafate Take 1 tablet (1 g total) by mouth 4 (four) times daily.        Allergies  Allergen Reactions   Peanut-Containing Drug Products Anaphylaxis and Hives    Patient states she is allergic to all nuts    Shellfish Allergy Anaphylaxis   Gluten Meal    Latex Other (See Comments)    Reaction: blistering   Other Other (See Comments)    Paper tape please   Prednisone Hives and Other (See Comments)    Reaction: causes psychological issues    Nexium [Esomeprazole Magnesium] Hives   Tape Rash    Paper tape please    Consultations: None   Procedures/Studies: US RENAL  Result Date: 07/17/2022 CLINICAL DATA:  Pyelonephritis. EXAM: RENAL / URINARY TRACT ULTRASOUND COMPLETE COMPARISON:  CT dated 04/19/2022. FINDINGS: Evaluation is limited due to body habitus and overlying bowel gas. Right Kidney: Renal measurements: 11.8 x 5.9 x 7.0 cm = volume: 256 mL. Normal echogenicity. Faint 7 mm echogenic focus in the interpolar right kidney may be artifactual or represent a nonobstructing calculus. No hydronephrosis.  Left Kidney: Renal measurements: 9.4 x 4.9 x 4.7 cm = volume: 1 per T mL. Normal echogenicity. No hydronephrosis or shadowing stone. Bladder: Appears normal for degree of bladder distention. Other: None. IMPRESSION: Artifact versus a 7 mm nonobstructing right renal interpolar calculus. No hydronephrosis. Electronically Signed   By: Elgie Collard M.D.   On: 07/17/2022 17:21   DG Chest Port 1 View  Result Date: 07/15/2022 CLINICAL DATA:  Patient to ED from home with possible UTI. Fever, dysuria and urinary frequency EXAM: PORTABLE CHEST 1 VIEW COMPARISON:  CT chest abdomen pelvis and radiograph 04/19/2022 FINDINGS: Stable enlargement of the cardiomediastinal silhouette. Pulmonary vascular congestion. No focal consolidation, pleural effusion, or pneumothorax. No displaced rib fractures. Spinal cord stimulator. Cervical spine fusion hardware. IMPRESSION: Cardiomegaly with pulmonary vascular congestion. Electronically Signed   By: Minerva Fester M.D.   On: 07/15/2022 19:04     Discharge Exam: Vitals:   07/19/22 2016 07/20/22 0412  BP: (!) 127/48 133/60  Pulse: (!) 58 61  Resp: 15 16  Temp: 97.9  F (36.6 C) 98.4 F (36.9 C)  SpO2: 98% 99%   Vitals:   07/19/22 0800 07/19/22 1850 07/19/22 2016 07/20/22 0412  BP: 132/69 (!) 154/61 (!) 127/48 133/60  Pulse: 74 (!) 58 (!) 58 61  Resp:  Temp:  98.8 F (37.1 C) 97.9 F (36.6 C) 98.4 F (36.9 C)  TempSrc:  Oral Oral Oral  SpO2:  99% 98% 99%  Weight:      Height:        General: Pt is alert, awake, not in acute distress Cardiovascular: RRR, S1/S2 +, no rubs, no gallops Respiratory: CTA bilaterally, no wheezing, no rhonchi Abdominal: Soft, NT, ND, bowel sounds + Extremities: no edema, no cyanosis    The results of significant diagnostics from this hospitalization (including imaging, microbiology, ancillary and laboratory) are listed below for reference.     Microbiology: Recent Results (from the past 240 hour(s))  Urine Culture     Status: Abnormal   Collection Time: 07/15/22  5:26 PM   Specimen: Urine, Clean Catch  Result Value Ref Range Status   Specimen Description   Final    URINE, CLEAN CATCH Performed at [facility], [address] [place], [place] [address]    Special Requests   Final    NONE Performed at [facility], [address] [place], [place] [address]    Culture >=100,000 COLONIES/mL ESCHERICHIA COLI (A)  Final   Report Status 07/18/2022 FINAL  Final   Organism ID, Bacteria ESCHERICHIA COLI (A)  Final      Susceptibility   Escherichia coli - MIC*    AMPICILLIN <=2 SENSITIVE Sensitive     CEFAZOLIN <=4 SENSITIVE Sensitive     CEFEPIME <=0.12 SENSITIVE Sensitive     CEFTRIAXONE <=0.25 SENSITIVE Sensitive     CIPROFLOXACIN <=0.25 SENSITIVE Sensitive     GENTAMICIN <=1 SENSITIVE Sensitive     IMIPENEM <=0.25 SENSITIVE Sensitive     NITROFURANTOIN <=16 SENSITIVE Sensitive     TRIMETH/SULFA <=20 SENSITIVE Sensitive     AMPICILLIN/SULBACTAM <=2 SENSITIVE Sensitive     PIP/TAZO <=4 SENSITIVE Sensitive     * >=100,000 COLONIES/mL ESCHERICHIA COLI  Blood Culture (routine x 2)     Status:  Abnormal   Collection Time: 07/15/22  7:14 PM   Specimen: BLOOD LEFT WRIST  Result Value Ref Range Status   Specimen Description   Final    BLOOD LEFT WRIST Performed at [facility], [address] [place], [place] [address]    Special Requests   Final    BOTTLES DRAWN AEROBIC AND ANAEROBIC Blood Culture adequate volume Performed at [facility], [address], [place], [place] [address]    Culture  Setup Time   Final    GRAM NEGATIVE RODS Gram Stain Report Called to,Read Back By and Verified With: BELTON, K. @ 1512 ON 07/16/2022 BY FRATTO,A. ANAEROBIC BOTTLE ONLY CRITICAL RESULT CALLED TO, READ BACK BY AND VERIFIED WITH: RN Neldon Mc [phone] [phone] JRS Performed at [facility], [address], [place], [place] [address]    Culture ESCHERICHIA COLI (A)  Final   Report Status 07/18/2022 FINAL  Final   Organism ID, Bacteria ESCHERICHIA COLI  Final      Susceptibility   Escherichia coli - MIC*    AMPICILLIN <=2 SENSITIVE Sensitive     CEFEPIME <=0.12 SENSITIVE Sensitive     CEFTAZIDIME <=1 SENSITIVE Sensitive     CEFTRIAXONE <=0.25 SENSITIVE Sensitive     CIPROFLOXACIN <=0.25 SENSITIVE Sensitive     GENTAMICIN <=1 SENSITIVE Sensitive     IMIPENEM <=0.25 SENSITIVE Sensitive     TRIMETH/SULFA <=20 SENSITIVE Sensitive     AMPICILLIN/SULBACTAM <=2 SENSITIVE Sensitive     PIP/TAZO <=4 SENSITIVE Sensitive     * ESCHERICHIA COLI  Blood Culture (routine x 2)     Status: Abnormal   Collection Time: 07/15/22  7:14 PM   Specimen: BLOOD  Result Value Ref Range Status   Specimen Description   Final    BLOOD BLOOD RIGHT FOREARM Performed at [facility], [address] [place], [place] [address]    Special Requests   Final    BOTTLES DRAWN AEROBIC AND ANAEROBIC Blood Culture adequate volume Performed at [facility], [address], [place], [place] [address]    Culture  Setup Time   Final    GRAM NEGATIVE RODS AEROBIC BOTTLE ONLY CRITICAL VALUE NOTED.  VALUE IS  CONSISTENT WITH PREVIOUSLY REPORTED AND CALLED VALUE.    Culture (A)  Final    ESCHERICHIA COLI SUSCEPTIBILITIES PERFORMED ON PREVIOUS CULTURE WITHIN THE LAST 5 DAYS. Performed at [facility], [address], [place], [place] [address]    Report Status 07/19/2022 FINAL  Final  Blood Culture ID Panel (Reflexed)     Status: Abnormal   Collection Time: 07/15/22  7:14 PM  Result Value Ref Range Status   Enterococcus faecalis NOT DETECTED NOT DETECTED Final   Enterococcus Faecium NOT DETECTED NOT DETECTED Final   Listeria monocytogenes NOT DETECTED NOT DETECTED Final   Staphylococcus species NOT DETECTED NOT DETECTED Final   Staphylococcus aureus (BCID) NOT DETECTED NOT DETECTED Final   Staphylococcus epidermidis NOT DETECTED NOT DETECTED Final   Staphylococcus lugdunensis NOT DETECTED NOT DETECTED Final   Streptococcus species NOT DETECTED NOT DETECTED Final   Streptococcus agalactiae NOT DETECTED NOT DETECTED Final   Streptococcus pneumoniae NOT DETECTED NOT DETECTED Final   Streptococcus pyogenes NOT DETECTED NOT DETECTED Final   A.calcoaceticus-baumannii NOT DETECTED NOT DETECTED Final   Bacteroides fragilis NOT DETECTED NOT DETECTED Final   Enterobacterales DETECTED (A) NOT DETECTED Final    Comment: Enterobacterales represent a large order of gram negative bacteria, not a single organism. CRITICAL RESULT CALLED TO, READ BACK BY AND VERIFIED WITH: RN TINA TALBOTT [phone] [phone] BY JRS    Enterobacter cloacae complex NOT DETECTED NOT DETECTED Final   Escherichia coli DETECTED (A) NOT DETECTED Final  Comment: CRITICAL RESULT CALLED TO, READ BACK BY AND VERIFIED WITH: RN TINA TALBOTT [phone] [id number] BY JRS    Klebsiella aerogenes NOT DETECTED NOT DETECTED Final   Klebsiella oxytoca NOT DETECTED NOT DETECTED Final   Klebsiella pneumoniae NOT DETECTED NOT DETECTED Final   Proteus species NOT DETECTED NOT DETECTED Final   Salmonella species NOT DETECTED NOT DETECTED Final    Serratia marcescens NOT DETECTED NOT DETECTED Final   Haemophilus influenzae NOT DETECTED NOT DETECTED Final   Neisseria meningitidis NOT DETECTED NOT DETECTED Final   Pseudomonas aeruginosa NOT DETECTED NOT DETECTED Final   Stenotrophomonas maltophilia NOT DETECTED NOT DETECTED Final   Candida albicans NOT DETECTED NOT DETECTED Final   Candida auris NOT DETECTED NOT DETECTED Final   Candida glabrata NOT DETECTED NOT DETECTED Final   Candida krusei NOT DETECTED NOT DETECTED Final   Candida parapsilosis NOT DETECTED NOT DETECTED Final   Candida tropicalis NOT DETECTED NOT DETECTED Final   Cryptococcus neoformans/gattii NOT DETECTED NOT DETECTED Final   CTX-M ESBL NOT DETECTED NOT DETECTED Final   Carbapenem resistance IMP NOT DETECTED NOT DETECTED Final   Carbapenem resistance KPC NOT DETECTED NOT DETECTED Final   Carbapenem resistance NDM NOT DETECTED NOT DETECTED Final   Carbapenem resist OXA 48 LIKE NOT DETECTED NOT DETECTED Final   Carbapenem resistance VIM NOT DETECTED NOT DETECTED Final    Comment: Performed at [facility], [address], [place], [place] [address]  SARS Coronavirus 2 by RT PCR (hospital order, performed in [facility] hospital lab) *cepheid single result test* Anterior Nasal Swab     Status: None   Collection Time: 07/15/22  8:50 PM   Specimen: Anterior Nasal Swab  Result Value Ref Range Status   SARS Coronavirus 2 by RT PCR NEGATIVE NEGATIVE Final    Comment: (NOTE) SARS-CoV-2 target nucleic acids are NOT DETECTED.  The SARS-CoV-2 RNA is generally detectable in upper and lower respiratory specimens during the acute phase of infection. The lowest concentration of SARS-CoV-2 viral copies this assay can detect is 250 copies / mL. A negative result does not preclude SARS-CoV-2 infection and should not be used as the sole basis for treatment or other patient management decisions.  A negative result may occur with improper specimen collection / handling,  submission of specimen other than nasopharyngeal swab, presence of viral mutation(s) within the areas targeted by this assay, and inadequate number of viral copies (<250 copies / mL). A negative result must be combined with clinical observations, patient history, and epidemiological information.  Fact Sheet for Patients:   RoadLapTop.co.za  Fact Sheet for Healthcare Providers: http://kim-miller.com/  This test is not yet approved or  cleared by the [place] FDA and has been authorized for detection and/or diagnosis of SARS-CoV-2 by FDA under an Emergency Use Authorization (EUA).  This EUA will remain in effect (meaning this test can be used) for the duration of the COVID-19 declaration under Section 564(b)(1) of the Act, 21 U.S.C. section 360bbb-3(b)(1), unless the authorization is terminated or revoked sooner.  Performed at [facility], [address] [place], [place] [address]   Culture, blood (Routine X 2) w Reflex to ID Panel     Status: None (Preliminary result)   Collection Time: 07/17/22  5:30 PM   Specimen: Right Antecubital; Blood  Result Value Ref Range Status   Specimen Description   Final    RIGHT ANTECUBITAL BOTTLES DRAWN AEROBIC AND ANAEROBIC   Special Requests Blood Culture adequate volume  Final   Culture   Final    NO GROWTH 3 DAYS Performed at [facility], [address] [place], [place] [address]    Report Status PENDING  Incomplete  Culture, blood (Routine X 2) w Reflex to ID Panel     Status: None (Preliminary result)   Collection Time: 07/17/22  5:30 PM   Specimen: BLOOD RIGHT HAND  Result Value Ref Range Status   Specimen Description   Final    BLOOD RIGHT HAND BOTTLES DRAWN AEROBIC AND ANAEROBIC   Special Requests Blood Culture adequate volume  Final   Culture   Final    NO GROWTH 3 DAYS Performed at [facility], [address] [place], [place] [address]    Report Status PENDING  Incomplete      Labs: BNP (last 3 results) No results for input(s): "BNP" in the last 8760 hours. Basic Metabolic Panel: Recent Labs  Lab 07/15/22 1610 07/17/22 1439 07/18/22 0340  NA 134* 133* 136  K 3.7 3.2* 3.9  CL 101 99 103  CO2 25 26 27   GLUCOSE 127* 108* 99  BUN 16 14 15   CREATININE 0.83 0.85 0.79  CALCIUM 8.0* 8.1* 7.8*   Liver Function Tests: Recent Labs  Lab 07/15/22 1610 07/17/22 1439  AST 122* 90*  ALT 42 39  ALKPHOS 123 137*  BILITOT 0.7 0.3  PROT 7.7 8.1  ALBUMIN 2.8* 2.8*   Recent Labs  Lab 07/15/22 1610  LIPASE 19   No results for input(s): "AMMONIA" in the last 168 hours. CBC: Recent Labs  Lab 07/15/22 1610 07/17/22 1439 07/18/22 0340  WBC 7.9 4.4 3.8*  NEUTROABS 6.2 2.8  --   HGB 9.7* 9.9* 9.1*  HCT 31.4* 32.3* 30.1*  MCV 77.0* 76.5* 78.6*  PLT 173 184 183   Cardiac Enzymes: No results for input(s): "CKTOTAL", "CKMB", "CKMBINDEX", "TROPONINI" in the last 168 hours. BNP: Invalid input(s): "POCBNP" CBG: Recent Labs  Lab 07/19/22 0752 07/19/22 1153 07/19/22 1626 07/19/22 2133 07/20/22 0758  GLUCAP 127* 158* 113* 254* 98   D-Dimer No results for input(s): "DDIMER" in the last 72 hours. Hgb A1c Recent Labs    07/18/22 0340  HGBA1C 7.1*   Lipid Profile No results for input(s): "CHOL", "HDL", "LDLCALC", "TRIG", "CHOLHDL", "LDLDIRECT" in the last 72 hours. Thyroid function studies No results for input(s): "TSH", "T4TOTAL", "T3FREE", "THYROIDAB" in the last 72 hours.  Invalid input(s): "FREET3" Anemia work up No results for input(s): "VITAMINB12", "FOLATE", "FERRITIN", "TIBC", "IRON", "RETICCTPCT" in the last 72 hours. Urinalysis    Component Value Date/Time   COLORURINE YELLOW 07/17/2022 1405   APPEARANCEUR CLOUDY (A) 07/17/2022 1405   LABSPEC 1.019 07/17/2022 1405   PHURINE 6.0 07/17/2022 1405   GLUCOSEU NEGATIVE 07/17/2022 1405   HGBUR NEGATIVE 07/17/2022 1405   BILIRUBINUR NEGATIVE 07/17/2022 1405   KETONESUR 5 (A) 07/17/2022  1405   PROTEINUR 30 (A) 07/17/2022 1405   UROBILINOGEN 1.0 06/26/2014 1720   NITRITE NEGATIVE 07/17/2022 1405   LEUKOCYTESUR MODERATE (A) 07/17/2022 1405   Sepsis Labs Recent Labs  Lab 07/15/22 1610 07/17/22 1439 07/18/22 0340  WBC 7.9 4.4 3.8*   Microbiology Recent Results (from the past 240 hour(s))  Urine Culture     Status: Abnormal   Collection Time: 07/15/22  5:26 PM   Specimen: Urine, Clean Catch  Result Value Ref Range Status   Specimen Description   Final    URINE, CLEAN CATCH Performed at [facility], [address] [place], [place] [address]  Special Requests   Final    NONE Performed at [facility], [address], [place], [place] [address]    Culture >=100,000 COLONIES/mL ESCHERICHIA COLI (A)  Final   Report Status 07/18/2022 FINAL  Final   Organism ID, Bacteria ESCHERICHIA COLI (A)  Final      Susceptibility   Escherichia coli - MIC*    AMPICILLIN <=2 SENSITIVE Sensitive     CEFAZOLIN <=4 SENSITIVE Sensitive     CEFEPIME <=0.12 SENSITIVE Sensitive     CEFTRIAXONE <=0.25 SENSITIVE Sensitive     CIPROFLOXACIN <=0.25 SENSITIVE Sensitive     GENTAMICIN <=1 SENSITIVE Sensitive     IMIPENEM <=0.25 SENSITIVE Sensitive     NITROFURANTOIN <=16 SENSITIVE Sensitive     TRIMETH/SULFA <=20 SENSITIVE Sensitive     AMPICILLIN/SULBACTAM <=2 SENSITIVE Sensitive     PIP/TAZO <=4 SENSITIVE Sensitive     * >=100,000 COLONIES/mL ESCHERICHIA COLI  Blood Culture (routine x 2)     Status: Abnormal   Collection Time: 07/15/22  7:14 PM   Specimen: BLOOD LEFT WRIST  Result Value Ref Range Status   Specimen Description   Final    BLOOD LEFT WRIST Performed at [facility], [address] [place], [place] [address]    Special Requests   Final    BOTTLES DRAWN AEROBIC AND ANAEROBIC Blood Culture adequate volume Performed at [facility], [address] [place], [place] [address]    Culture  Setup Time   Final    GRAM NEGATIVE RODS Gram Stain Report Called to,Read  Back By and Verified With: BELTON, K. @ 1512 ON 07/16/2022 BY FRATTO,A. ANAEROBIC BOTTLE ONLY CRITICAL RESULT CALLED TO, READ BACK BY AND VERIFIED WITH: RN Neldon Mc [phone] JRS Performed at [facility], [address], [place], [place] [address]    Culture ESCHERICHIA COLI (A)  Final   Report Status 07/18/2022 FINAL  Final   Organism ID, Bacteria ESCHERICHIA COLI  Final      Susceptibility   Escherichia coli - MIC*    AMPICILLIN <=2 SENSITIVE Sensitive     CEFEPIME <=0.12 SENSITIVE Sensitive     CEFTAZIDIME <=1 SENSITIVE Sensitive     CEFTRIAXONE <=0.25 SENSITIVE Sensitive     CIPROFLOXACIN <=0.25 SENSITIVE Sensitive     GENTAMICIN <=1 SENSITIVE Sensitive     IMIPENEM <=0.25 SENSITIVE Sensitive     TRIMETH/SULFA <=20 SENSITIVE Sensitive     AMPICILLIN/SULBACTAM <=2 SENSITIVE Sensitive     PIP/TAZO <=4 SENSITIVE Sensitive     * ESCHERICHIA COLI  Blood Culture (routine x 2)     Status: Abnormal   Collection Time: 07/15/22  7:14 PM   Specimen: BLOOD  Result Value Ref Range Status   Specimen Description   Final    BLOOD BLOOD RIGHT FOREARM Performed at [facility], [address], [place], [place] [address]    Special Requests   Final    BOTTLES DRAWN AEROBIC AND ANAEROBIC Blood Culture adequate volume Performed at [facility], [address], [place], [place] [address]    Culture  Setup Time   Final    GRAM NEGATIVE RODS AEROBIC BOTTLE ONLY CRITICAL VALUE NOTED.  VALUE IS CONSISTENT WITH PREVIOUSLY REPORTED AND CALLED VALUE.    Culture (A)  Final    ESCHERICHIA COLI SUSCEPTIBILITIES PERFORMED ON PREVIOUS CULTURE WITHIN THE LAST 5 DAYS. Performed at [facility], [address] [place], [place] [address]    Report Status 07/19/2022 FINAL  Final  Blood Culture ID Panel (Reflexed)     Status: Abnormal   Collection Time: 07/15/22  7:14 PM  Result Value Ref Range Status   Enterococcus faecalis NOT DETECTED NOT DETECTED Final   Enterococcus Faecium NOT  DETECTED NOT DETECTED Final   Listeria monocytogenes NOT DETECTED NOT DETECTED Final   Staphylococcus species NOT DETECTED NOT DETECTED Final   Staphylococcus aureus (BCID) NOT DETECTED NOT DETECTED Final   Staphylococcus epidermidis NOT DETECTED NOT DETECTED Final   Staphylococcus lugdunensis NOT DETECTED NOT DETECTED Final   Streptococcus species NOT DETECTED NOT DETECTED Final   Streptococcus agalactiae NOT DETECTED NOT DETECTED Final   Streptococcus pneumoniae NOT DETECTED NOT DETECTED Final   Streptococcus pyogenes NOT DETECTED NOT DETECTED Final   A.calcoaceticus-baumannii NOT DETECTED NOT DETECTED Final   Bacteroides fragilis NOT DETECTED NOT DETECTED Final   Enterobacterales DETECTED (A) NOT DETECTED Final    Comment: Enterobacterales represent a large order of gram negative bacteria, not a single organism. CRITICAL RESULT CALLED TO, READ BACK BY AND VERIFIED WITH: RN TINA TALBOTT [id number] BY JRS    Enterobacter cloacae complex NOT DETECTED NOT DETECTED Final   Escherichia coli DETECTED (A) NOT DETECTED Final    Comment: CRITICAL RESULT CALLED TO, READ BACK BY AND VERIFIED WITH: RN TINA TALBOTT [phone] [id number] BY JRS    Klebsiella aerogenes NOT DETECTED NOT DETECTED Final   Klebsiella oxytoca NOT DETECTED NOT DETECTED Final   Klebsiella pneumoniae NOT DETECTED NOT DETECTED Final   Proteus species NOT DETECTED NOT DETECTED Final   Salmonella species NOT DETECTED NOT DETECTED Final   Serratia marcescens NOT DETECTED NOT DETECTED Final   Haemophilus influenzae NOT DETECTED NOT DETECTED Final   Neisseria meningitidis NOT DETECTED NOT DETECTED Final   Pseudomonas aeruginosa NOT DETECTED NOT DETECTED Final   Stenotrophomonas maltophilia NOT DETECTED NOT DETECTED Final   Candida albicans NOT DETECTED NOT DETECTED Final   Candida auris NOT DETECTED NOT DETECTED Final   Candida glabrata NOT DETECTED NOT DETECTED Final   Candida krusei NOT DETECTED NOT DETECTED Final   Candida  parapsilosis NOT DETECTED NOT DETECTED Final   Candida tropicalis NOT DETECTED NOT DETECTED Final   Cryptococcus neoformans/gattii NOT DETECTED NOT DETECTED Final   CTX-M ESBL NOT DETECTED NOT DETECTED Final   Carbapenem resistance IMP NOT DETECTED NOT DETECTED Final   Carbapenem resistance KPC NOT DETECTED NOT DETECTED Final   Carbapenem resistance NDM NOT DETECTED NOT DETECTED Final   Carbapenem resist OXA 48 LIKE NOT DETECTED NOT DETECTED Final   Carbapenem resistance VIM NOT DETECTED NOT DETECTED Final    Comment: Performed at [facility], [address], [place], [place] [address]  SARS Coronavirus 2 by RT PCR (hospital order, performed in [facility] hospital lab) *cepheid single result test* Anterior Nasal Swab     Status: None   Collection Time: 07/15/22  8:50 PM   Specimen: Anterior Nasal Swab  Result Value Ref Range Status   SARS Coronavirus 2 by RT PCR NEGATIVE NEGATIVE Final    Comment: (NOTE) SARS-CoV-2 target nucleic acids are NOT DETECTED.  The SARS-CoV-2 RNA is generally detectable in upper and lower respiratory specimens during the acute phase of infection. The lowest concentration of SARS-CoV-2 viral copies this assay can detect is 250 copies / mL. A negative result does not preclude SARS-CoV-2 infection and should not be used as the sole basis for treatment or other patient management decisions.  A negative result may occur with improper specimen collection /  handling, submission of specimen other than nasopharyngeal swab, presence of viral mutation(s) within the areas targeted by this assay, and inadequate number of viral copies (<250 copies / mL). A negative result must be combined with clinical observations, patient history, and epidemiological information.  Fact Sheet for Patients:   RoadLapTop.co.za  Fact Sheet for Healthcare Providers: http://kim-miller.com/  This test is not yet approved or  cleared by  the [place] FDA and has been authorized for detection and/or diagnosis of SARS-CoV-2 by FDA under an Emergency Use Authorization (EUA).  This EUA will remain in effect (meaning this test can be used) for the duration of the COVID-19 declaration under Section 564(b)(1) of the Act, 21 U.S.C. section 360bbb-3(b)(1), unless the authorization is terminated or revoked sooner.  Performed at [facility], [address] [place], [place] [address]   Culture, blood (Routine X 2) w Reflex to ID Panel     Status: None (Preliminary result)   Collection Time: 07/17/22  5:30 PM   Specimen: Right Antecubital; Blood  Result Value Ref Range Status   Specimen Description   Final    RIGHT ANTECUBITAL BOTTLES DRAWN AEROBIC AND ANAEROBIC   Special Requests Blood Culture adequate volume  Final   Culture   Final    NO GROWTH 3 DAYS Performed at [facility], [address] [place], [place] [address]    Report Status PENDING  Incomplete  Culture, blood (Routine X 2) w Reflex to ID Panel     Status: None (Preliminary result)   Collection Time: 07/17/22  5:30 PM   Specimen: BLOOD RIGHT HAND  Result Value Ref Range Status   Specimen Description   Final    BLOOD RIGHT HAND BOTTLES DRAWN AEROBIC AND ANAEROBIC   Special Requests Blood Culture adequate volume  Final   Culture   Final    NO GROWTH 3 DAYS Performed at [facility], [address], [place], [place] [address]    Report Status PENDING  Incomplete     Time coordinating discharge: 35 minutes  SIGNED:   Erick Blinks, DO [facility] 07/20/2022, 9:04 AM  If 7PM-7AM, please contact night-coverage [email or web address]

## 2022-07-20 NOTE — Plan of Care (Signed)
Patient AOX4, VSS throughout shift.  All meds given on time as ordered.  Pt c/o pain relieved by PRN oxycodone.  Diminished lungs, IS encouraged.  Pt voided in bathroom.  POC maintained, will continue to monitor.  Problem: Education: Goal: Knowledge of General Education information will improve Description: Including pain rating scale, medication(s)/side effects and non-pharmacologic comfort measures Outcome: Progressing   Problem: Health Behavior/Discharge Planning: Goal: Ability to manage health-related needs will improve Outcome: Progressing   Problem: Clinical Measurements: Goal: Ability to maintain clinical measurements within normal limits will improve Outcome: Progressing Goal: Will remain free from infection Outcome: Progressing Goal: Diagnostic test results will improve Outcome: Progressing Goal: Respiratory complications will improve Outcome: Progressing Goal: Cardiovascular complication will be avoided Outcome: Progressing   Problem: Activity: Goal: Risk for activity intolerance will decrease Outcome: Progressing   Problem: Nutrition: Goal: Adequate nutrition will be maintained Outcome: Progressing   Problem: Coping: Goal: Level of anxiety will decrease Outcome: Progressing   Problem: Elimination: Goal: Will not experience complications related to bowel motility Outcome: Progressing Goal: Will not experience complications related to urinary retention Outcome: Progressing   Problem: Pain Managment: Goal: General experience of comfort will improve Outcome: Progressing   Problem: Safety: Goal: Ability to remain free from injury will improve Outcome: Progressing   Problem: Skin Integrity: Goal: Risk for impaired skin integrity will decrease Outcome: Progressing   

## 2022-07-20 NOTE — Progress Notes (Signed)
Discharge instructions given by [facility]. Accompanied by staff to awaiting vehicle.Marland Kitchen

## 2022-07-20 NOTE — Telephone Encounter (Signed)
Post ED Visit - Positive Culture Follow-up  Culture report reviewed by antimicrobial stewardship pharmacist: [facility]  Enzo Bi, Pharm.D.  Celedonio Miyamoto, Pharm.D., BCPS AQ-ID  Garvin Fila, Pharm.D., BCPS  Georgina Pillion, [address].D., BCPS  [place], [place].D., BCPS, AAHIVP  Estella Husk, Pharm.D., BCPS, AAHIVP  Lysle Pearl, PharmD, BCPS  Phillips Climes, PharmD, BCPS  Agapito Games, PharmD, BCPS  Verlan Friends, PharmD  Mervyn Gay, PharmD, BCPS  Vinnie Level, PharmD  [facility]  Len Childs, PharmD  Greer Pickerel, PharmD  Adalberto Cole, PharmD  Perlie Gold, Rph  Lonell Face) Jean Rosenthal, PharmD  Earl Many, PharmD  Junita Push, PharmD  Dorna Leitz, PharmD  Terrilee Files, PharmD  Lynann Beaver, PharmD  Keturah Barre, PharmD  Loralee Pacas, PharmD  Bernadene Person, PharmD   Positive blood culture Admitted to [facility] and on abx and no further patient follow-up is required at this time. Eldridge Scot, PharmD  Virl Axe Talley 07/20/2022, 10:18 AM

## 2022-07-21 LAB — CULTURE, BLOOD (ROUTINE X 2): Special Requests: ADEQUATE

## 2022-07-22 ENCOUNTER — Encounter (HOSPITAL_COMMUNITY)
Admission: RE | Admit: 2022-07-22 | Discharge: 2022-07-22 | Disposition: A | Discharge status: Home / Self Care | Payer: BC Managed Care – PPO | Source: Ambulatory Visit | Attending: Internal Medicine | Admitting: Internal Medicine

## 2022-07-22 ENCOUNTER — Encounter (HOSPITAL_COMMUNITY): Payer: Self-pay

## 2022-07-22 LAB — CULTURE, BLOOD (ROUTINE X 2)
Culture: NO GROWTH
Special Requests: ADEQUATE

## 2022-07-24 ENCOUNTER — Ambulatory Visit (HOSPITAL_COMMUNITY): Payer: BC Managed Care – PPO | Admitting: Anesthesiology

## 2022-07-24 ENCOUNTER — Encounter (HOSPITAL_COMMUNITY)
Admission: RE | Disposition: A | Discharge status: Home / Self Care | Payer: Self-pay | Source: Home / Self Care | Attending: Internal Medicine

## 2022-07-24 ENCOUNTER — Ambulatory Visit (HOSPITAL_COMMUNITY)
Admission: RE | Admit: 2022-07-24 | Discharge: 2022-07-24 | Disposition: A | Discharge status: Home / Self Care | Payer: BC Managed Care – PPO | Attending: Internal Medicine | Admitting: Internal Medicine

## 2022-07-24 ENCOUNTER — Encounter (HOSPITAL_COMMUNITY): Payer: Self-pay | Admitting: Internal Medicine

## 2022-07-24 DIAGNOSIS — G4730 Sleep apnea, unspecified: Secondary | ICD-10-CM | POA: Diagnosis not present

## 2022-07-24 DIAGNOSIS — K3189 Other diseases of stomach and duodenum: Secondary | ICD-10-CM

## 2022-07-24 DIAGNOSIS — G894 Chronic pain syndrome: Secondary | ICD-10-CM | POA: Insufficient documentation

## 2022-07-24 DIAGNOSIS — K279 Peptic ulcer, site unspecified, unspecified as acute or chronic, without hemorrhage or perforation: Secondary | ICD-10-CM

## 2022-07-24 DIAGNOSIS — M1990 Unspecified osteoarthritis, unspecified site: Secondary | ICD-10-CM | POA: Diagnosis not present

## 2022-07-24 DIAGNOSIS — I10 Essential (primary) hypertension: Secondary | ICD-10-CM | POA: Diagnosis not present

## 2022-07-24 DIAGNOSIS — F32A Depression, unspecified: Secondary | ICD-10-CM | POA: Insufficient documentation

## 2022-07-24 DIAGNOSIS — K766 Portal hypertension: Secondary | ICD-10-CM

## 2022-07-24 DIAGNOSIS — I8500 Esophageal varices without bleeding: Secondary | ICD-10-CM | POA: Insufficient documentation

## 2022-07-24 DIAGNOSIS — K259 Gastric ulcer, unspecified as acute or chronic, without hemorrhage or perforation: Secondary | ICD-10-CM | POA: Diagnosis present

## 2022-07-24 DIAGNOSIS — Z7984 Long term (current) use of oral hypoglycemic drugs: Secondary | ICD-10-CM | POA: Insufficient documentation

## 2022-07-24 DIAGNOSIS — Z8711 Personal history of peptic ulcer disease: Secondary | ICD-10-CM | POA: Diagnosis not present

## 2022-07-24 DIAGNOSIS — J45909 Unspecified asthma, uncomplicated: Secondary | ICD-10-CM | POA: Diagnosis not present

## 2022-07-24 DIAGNOSIS — E1165 Type 2 diabetes mellitus with hyperglycemia: Secondary | ICD-10-CM | POA: Insufficient documentation

## 2022-07-24 DIAGNOSIS — I8510 Secondary esophageal varices without bleeding: Secondary | ICD-10-CM | POA: Diagnosis not present

## 2022-07-24 DIAGNOSIS — G35 Multiple sclerosis: Secondary | ICD-10-CM | POA: Diagnosis not present

## 2022-07-24 DIAGNOSIS — Z794 Long term (current) use of insulin: Secondary | ICD-10-CM | POA: Diagnosis not present

## 2022-07-24 DIAGNOSIS — K219 Gastro-esophageal reflux disease without esophagitis: Secondary | ICD-10-CM | POA: Diagnosis not present

## 2022-07-24 DIAGNOSIS — Z79899 Other long term (current) drug therapy: Secondary | ICD-10-CM | POA: Diagnosis not present

## 2022-07-24 DIAGNOSIS — D500 Iron deficiency anemia secondary to blood loss (chronic): Secondary | ICD-10-CM

## 2022-07-24 DIAGNOSIS — K7460 Unspecified cirrhosis of liver: Secondary | ICD-10-CM

## 2022-07-24 HISTORY — PX: ESOPHAGOGASTRODUODENOSCOPY (EGD) WITH PROPOFOL: SHX5813

## 2022-07-24 LAB — GLUCOSE, CAPILLARY: Glucose-Capillary: 114 mg/dL — ABNORMAL HIGH (ref 70–99)

## 2022-07-24 SURGERY — ESOPHAGOGASTRODUODENOSCOPY (EGD) WITH PROPOFOL
Anesthesia: General

## 2022-07-24 MED ORDER — STERILE WATER FOR IRRIGATION IR SOLN
Status: DC | PRN
  Administered 2022-07-24: 120 mL

## 2022-07-24 MED ORDER — PROPOFOL 500 MG/50ML IV EMUL
INTRAVENOUS | Status: DC | PRN
  Administered 2022-07-24: 150 ug/kg/min via INTRAVENOUS

## 2022-07-24 MED ORDER — PROPOFOL 10 MG/ML IV BOLUS
INTRAVENOUS | Status: DC | PRN
  Administered 2022-07-24: 100 mg via INTRAVENOUS

## 2022-07-24 MED ORDER — LACTATED RINGERS IV SOLN: INTRAVENOUS | Status: DC | PRN

## 2022-07-24 MED ORDER — LIDOCAINE HCL (CARDIAC) PF 100 MG/5ML IV SOSY
PREFILLED_SYRINGE | INTRAVENOUS | Status: DC | PRN
  Administered 2022-07-24: 50 mg via INTRAVENOUS

## 2022-07-24 NOTE — Anesthesia Preprocedure Evaluation (Addendum)
Anesthesia Evaluation  Patient identified by MRN, date of birth, ID band Patient awake    Reviewed: Allergy & Precautions, H&P , NPO status , Patient's Chart, lab work & pertinent test results, reviewed documented beta blocker date and time   Airway Mallampati: III  TM Distance: >3 FB Neck ROM: Full   Comment: Cervical spondylosis, neck sx Dental  (+) Dental Advisory Given, Missing   Pulmonary asthma , sleep apnea and Continuous Positive Airway Pressure Ventilation    Pulmonary exam normal breath sounds clear to auscultation       Cardiovascular Exercise Tolerance: Good hypertension, Pt. on medications and Pt. on home beta blockers Normal cardiovascular exam Rhythm:Regular Rate:Normal     Neuro/Psych  Headaches PSYCHIATRIC DISORDERS  Depression     Neuromuscular disease (multiple sclerosis)    GI/Hepatic Neg liver ROS,GERD  Medicated and Controlled,,  Endo/Other  diabetes, Poorly Controlled, Type 2, Oral Hypoglycemic Agents, Insulin Dependent Hyperthyroidism (patient denied h/o hyperthyroidism)   Renal/GU Renal disease  negative genitourinary   Musculoskeletal  (+) Arthritis , Osteoarthritis,    Abdominal   Peds negative pediatric ROS (+)  Hematology  (+) Blood dyscrasia, anemia   Anesthesia Other Findings Chronic pain syndrome  Reproductive/Obstetrics negative OB ROS                             Anesthesia Physical Anesthesia Plan  ASA: 3  Anesthesia Plan: General   Post-op Pain Management: Minimal or no pain anticipated   Induction: Intravenous  PONV Risk Score and Plan: 1 and Propofol infusion  Airway Management Planned: Nasal Cannula and Natural Airway  Additional Equipment:   Intra-op Plan:   Post-operative Plan:   Informed Consent: I have reviewed the patients History and Physical, chart, labs and discussed the procedure including the risks, benefits and alternatives for  the proposed anesthesia with the patient or authorized representative who has indicated his/her understanding and acceptance.     Dental advisory given  Plan Discussed with: CRNA and Surgeon  Anesthesia Plan Comments:         Anesthesia Quick Evaluation

## 2022-07-24 NOTE — Transfer of Care (Signed)
Immediate Anesthesia Transfer of Care Note  Patient: [name]  Procedure(s) Performed: ESOPHAGOGASTRODUODENOSCOPY (EGD) WITH PROPOFOL  Patient Location: Short Stay  Anesthesia Type:General  Level of Consciousness: drowsy  Airway & Oxygen Therapy: Patient Spontanous Breathing  Post-op Assessment: Report given to RN and Post -op Vital signs reviewed and stable  Post vital signs: Reviewed and stable  Last Vitals:  Vitals Value Taken Time  BP    Temp    Pulse    Resp    SpO2      Last Pain:  Vitals:   07/24/22 0732  PainSc: 0-No pain      Patients Stated Pain Goal: 7 (07/24/22 0732)  Complications: No notable events documented.

## 2022-07-24 NOTE — H&P (Signed)
@LOGO @   Primary Care Physician:  Elfredia Nevins, MD Primary Gastroenterologist:  Dr.   Pre-Procedure History & Physical: HPI:  [name] is a 57 y.o. female here for a surveillance EGD history of multiple NSAID induced gastric ulcers found earlier this year.  Biopsy negative for malignancy/H. pylori.  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 Norma Fredrickson at [facility]   Non-adherence to medical treatment 05/14/2009   Qualifier: Diagnosis of   By: Ricard Dillon       RECTAL BLEEDING 07/05/2009   Qualifier: Diagnosis of   By: Minna Merritts       Sleep apnea    Spondylosis of thoracic region without myelopathy or radiculopathy 07/02/2014   Uncontrolled type 2 diabetes mellitus with hyperglycemia (HCC) 03/15/2017    Past Surgical History:  Procedure Laterality Date   ABDOMINAL HYSTERECTOMY     APPENDECTOMY     BACK SURGERY  03/07/2012   Nerve Stimulator   BIOPSY  04/23/2022   Procedure: BIOPSY;   Surgeon: Dolores Frame, MD;  Location: [facility];  Service: Gastroenterology;;   CHOLECYSTECTOMY     COLONOSCOPY  07/09/2009   RMR; normal rectum aside from anal canal hemorrhoids/scattered left-sided diverticula   COLONOSCOPY WITH PROPOFOL N/A 04/23/2022   Procedure: COLONOSCOPY WITH PROPOFOL;  Surgeon: Dolores Frame, MD;  Location: [facility];  Service: Gastroenterology;  Laterality: N/A;   ESOPHAGOGASTRODUODENOSCOPY  05/22/2009   RMR; normal /small HH   ESOPHAGOGASTRODUODENOSCOPY  2007   RMR: non-critical Schatzki's rin, non-maniuplated   ESOPHAGOGASTRODUODENOSCOPY N/A 10/24/2013   Procedure: ESOPHAGOGASTRODUODENOSCOPY (EGD);  Surgeon: Corbin Ade, MD;  Location: [facility];  Service: Endoscopy;  Laterality: N/A;  9:45   ESOPHAGOGASTRODUODENOSCOPY N/A 04/23/2022   Procedure: ESOPHAGOGASTRODUODENOSCOPY (EGD);  Surgeon: Marguerita Merles, Reuel Boom, MD;  Location: [facility];  Service: Gastroenterology;  Laterality: N/A;   ESOPHAGOGASTRODUODENOSCOPY (EGD) WITH ESOPHAGEAL DILATION  03/17/2012   [id number] appearing esophageal mucosa of uncertain significance. Status post [name] dilation followed by esophageal bx   MALONEY DILATION N/A 10/24/2013   Procedure: [name] DILATION;  Surgeon: Corbin Ade, MD;  Location: [facility];  Service: Endoscopy;  Laterality: N/A;   MANDIBLE SURGERY     NECK SURGERY     X2, last time 2013    Prior to Admission medications   Medication Sig Start Date End Date Taking?  Authorizing Provider  ACCU-CHEK GUIDE test strip USE TO CHECK BLOOD SUGAR 4 TIMES DAILY AS DIRECTED 03/06/22  Yes Nida, Denman George, MD  Accu-Chek Softclix Lancets lancets USE TO CHECK BLOOD SUGAR 4 TIMES DAILY AS DIRECTED 03/06/22  Yes Nida, Denman George, MD  acetaminophen (TYLENOL) 500 MG tablet Take 1,000 mg by mouth every 6 (six) hours as needed for moderate pain.   Yes [provider]  baclofen (LIORESAL) 20 MG tablet Take 20 mg by  mouth 3 (three) times daily.  05/08/14  Yes [provider]  blood glucose meter kit and supplies KIT Dispense based on patient and insurance preference. Use up to four times daily as directed. (FOR ICD-9 250.00, 250.01). 03/15/19  Yes Vann, Jessica U, DO  Blood Glucose Monitoring Suppl (ACCU-CHEK GUIDE ME) w/Device KIT USE TO CHECK BLOOD SUGAR DAILY AS DIRECTED 12/31/21  Yes Nida, Denman George, MD  citalopram (CELEXA) 40 MG tablet Take 40 mg by mouth every evening.    Yes [provider]  clopidogrel (PLAVIX) 75 MG tablet Take 75 mg by mouth daily. 11/27/21  Yes [provider]  Continuous Blood Gluc Receiver (DEXCOM G7 RECEIVER) DEVI Use to check glucose as directed 10/28/21  Yes Nida, Denman George, MD  Continuous Blood Gluc Sensor (DEXCOM G7 SENSOR) MISC Change sensor every 10 days 01/19/22  Yes Nida, Denman George, MD  gabapentin (NEURONTIN) 800 MG tablet Take 800 mg by mouth 4 (four) times daily. 01/02/19  Yes [provider]  insulin isophane & regular human KwikPen (NOVOLIN 70/30 KWIKPEN) (70-30) 100 UNIT/ML KwikPen Inject 10 Units into the skin 2 (two) times daily before a meal. Patient taking differently: Inject 60 Units into the skin 2 (two) times daily before a meal. 12/02/21  Yes Memon, Durward Mallard, MD  Insulin Pen Needle (B-D ULTRAFINE III SHORT PEN) 31G X 8 MM MISC USE TO INJECT INSULIN 2 TIMES DAILY 06/19/22  Yes Nida, Denman George, MD  lisinopril (ZESTRIL) 5 MG tablet Take 1 tablet (5 mg total) by mouth daily. 04/25/22  Yes Shon Hale, MD  metFORMIN (GLUCOPHAGE) 1000 MG tablet Take 1 tablet (1,000 mg total) by mouth 2 (two) times daily with a meal. 04/25/22  Yes Emokpae, Courage, MD  metoprolol succinate (TOPROL-XL) 50 MG 24 hr tablet Take 1 tablet (50 mg total) by mouth daily. Take with or immediately following a meal. 04/25/22 04/20/23 Yes Emokpae, Courage, MD  ondansetron (ZOFRAN) 4 MG tablet Take 1 tablet (4 mg total) by mouth 4 (four) times daily  as needed for nausea or vomiting. 04/25/22  Yes Emokpae, Courage, MD  oxycodone (ROXICODONE) 30 MG immediate release tablet Take 15 mg by mouth every 6 (six) hours as needed for pain. 10/31/21  Yes [provider]  pantoprazole (PROTONIX) 40 MG tablet Take 1 tablet (40 mg total) by mouth 2 (two) times daily. 04/25/22 04/25/23 Yes Emokpae, Courage, MD  pravastatin (PRAVACHOL) 40 MG tablet Take 1 tablet (40 mg total) by mouth every evening. 03/15/19  Yes Black, Lesle Chris, NP  rOPINIRole (REQUIP) 0.5 MG tablet Take 0.5 mg by mouth at bedtime. 11/27/21  Yes [provider]  albuterol (ACCUNEB) 0.63 MG/3ML nebulizer solution Take 1 ampule by nebulization every 6 (six) hours as needed for wheezing or shortness of breath. 11/24/21   [provider]  albuterol (VENTOLIN HFA) 108 (90 Base) MCG/ACT inhaler Inhale 1-2 puffs into the lungs every 6 (six) hours as needed for wheezing or shortness of breath.  01/02/19   [provider]  baclofen (LIORESAL) 10 MG tablet Take 1 tablet (10 mg total) by mouth 3 (three) times daily. Patient not taking: Reported on 07/18/2022 07/15/22   Arthor Captain, PA-C  sucralfate (CARAFATE) 1 g tablet Take 1 tablet (1 g total) by mouth 4 (four) times daily. Patient not taking: Reported on 07/18/2022 04/25/22 04/25/23  Shon Hale, MD    Allergies as of 06/10/2022 - Review Complete 05/20/2022  Allergen Reaction Noted   Peanut-containing drug products Anaphylaxis and Hives    Shellfish allergy Anaphylaxis 02/23/2018   Gluten meal  07/11/2014   Latex Other (See Comments) 01/15/2011   Prednisone Hives and Other (See Comments)    Nexium [esomeprazole magnesium] Hives 08/15/2011   Tape Rash 06/26/2014    Family History  Problem Relation Age of Onset   Cancer Mother        unknown type   Diabetes Father    Diabetes Sister    Colon cancer Neg Hx     Social History   Socioeconomic History   Marital status: Married    Spouse name: Not on file    Number of children: Not on file   Years of education: Not on file   Highest education level: Not on file  Occupational History   Not on file  Tobacco Use   Smoking status: Never   Smokeless tobacco: Never  Vaping Use   Vaping Use: Never used  Substance and Sexual Activity   Alcohol use: Yes    Comment: only three times a year.   Drug use: No   Sexual activity: Yes    Birth control/protection: Surgical  Other Topics Concern   Not on file  Social History Narrative   5 kids, raising grandchild   Social Determinants of Health   Financial Resource Strain: Not on file  Food Insecurity: No Food Insecurity (07/17/2022)   Hunger Vital Sign    Worried About Running Out of Food in the Last Year: Never true    Ran Out of Food in the Last Year: Never true  Recent Concern: Food Insecurity - Food Insecurity Present (04/20/2022)   Hunger Vital Sign    Worried About Running Out of Food in the Last Year: Often true    Ran Out of Food in the Last Year: Sometimes true  Transportation Needs: No Transportation Needs (07/17/2022)   PRAPARE - Administrator, Civil Service (Medical): No    Lack of Transportation (Non-Medical): No  Physical Activity: Not on file  Stress: Not on file  Social Connections: Not on file  Intimate Partner Violence: Not At Risk (07/17/2022)   Humiliation, Afraid, Rape, and Kick questionnaire    Fear of Current or Ex-Partner: No    Emotionally Abused: No    Physically Abused: No    Sexually Abused: No    Review of Systems: See HPI, otherwise negative ROS  Physical Exam: BP 130/65 (BP Location: Right Arm)   Pulse 64   Temp 98.5 F (36.9 C)   Resp 12   Ht 5\' 5"  (1.651 m)   Wt 81.8 kg   SpO2 98%   BMI 30.01 kg/m  General:   Alert,  Well-developed, well-nourished, pleasant and cooperative in NAD Neck:  Supple; no masses or thyromegaly. No significant cervical adenopathy. Lungs:  Clear throughout to auscultation.   No wheezes, crackles, or rhonchi.  No acute distress. Heart:  Regular rate and rhythm; no murmurs, clicks, rubs,  or gallops. Abdomen: Non-distended, normal bowel sounds.  Soft and nontender without  appreciable mass or hepatosplenomegaly.  Pulses:  Normal pulses noted. Extremities:  Without clubbing or edema.  Impression/Plan: 57 year old lady here for surveillance EGD.  History of multiple gastric ulcers seen earlier this year.  NSAID related.  I will offer the patient a surveillance EGD.  The risks, benefits, limitations, alternatives and imponderables have been reviewed with the patient. Potential for esophageal dilation, biopsy, etc. have also been reviewed.  Questions have been answered. All parties agreeable.      Notice: This dictation was prepared with Dragon dictation along with smaller phrase technology. Any transcriptional errors that result from this process are unintentional and may not be corrected upon review.

## 2022-07-24 NOTE — Anesthesia Procedure Notes (Signed)
Date/Time: 07/24/2022 7:35 AM  Performed by: Julian Reil, CRNAPre-anesthesia Checklist: Patient identified, Emergency Drugs available, Suction available and Patient being monitored Patient Re-evaluated:Patient Re-evaluated prior to induction Oxygen Delivery Method: Nasal cannula Induction Type: IV induction Placement Confirmation: positive ETCO2

## 2022-07-24 NOTE — Discharge Instructions (Signed)
EGD Discharge instructions Please read the instructions outlined below and refer to this sheet in the next few weeks. These discharge instructions provide you with general information on caring for yourself after you leave the hospital. Your doctor may also give you specific instructions. While your treatment has been planned according to the most current medical practices available, unavoidable complications occasionally occur. If you have any problems or questions after discharge, please call your doctor. ACTIVITY You may resume your regular activity but move at a slower pace for the next 24 hours.  Take frequent rest periods for the next 24 hours.  Walking will help expel (get rid of) the air and reduce the bloated feeling in your abdomen.  No driving for 24 hours (because of the anesthesia (medicine) used during the test).  You may shower.  Do not sign any important legal documents or operate any machinery for 24 hours (because of the anesthesia used during the test).  NUTRITION Drink plenty of fluids.  You may resume your normal diet.  Begin with a light meal and progress to your normal diet.  Avoid alcoholic beverages for 24 hours or as instructed by your caregiver.  MEDICATIONS You may resume your normal medications unless your caregiver tells you otherwise.  WHAT YOU CAN EXPECT TODAY You may experience abdominal discomfort such as a feeling of fullness or "gas" pains.  FOLLOW-UP Your doctor will discuss the results of your test with you.  SEEK IMMEDIATE MEDICAL ATTENTION IF ANY OF THE FOLLOWING OCCUR: Excessive nausea (feeling sick to your stomach) and/or vomiting.  Severe abdominal pain and distention (swelling).  Trouble swallowing.  Temperature over 101 F (37.8 C).  Rectal bleeding or vomiting of blood.     Gastric ulcer is completely healed.  Continue to avoid nonsteroidal type medications like Advil Aleve and aspirin powders  Office visit with [place] in 6 months as  scheduled  At patient request, I called Michael at [phone]

## 2022-07-24 NOTE — Anesthesia Postprocedure Evaluation (Signed)
Anesthesia Post Note  Patient: [name]  Procedure(s) Performed: ESOPHAGOGASTRODUODENOSCOPY (EGD) WITH PROPOFOL  Patient location during evaluation: Phase II Anesthesia Type: General Level of consciousness: awake and alert and oriented Pain management: pain level controlled Vital Signs Assessment: post-procedure vital signs reviewed and stable Respiratory status: spontaneous breathing, nonlabored ventilation and respiratory function stable Cardiovascular status: blood pressure returned to baseline and stable Postop Assessment: no apparent nausea or vomiting Anesthetic complications: no  No notable events documented.   Last Vitals:  Vitals:   07/24/22 0633 07/24/22 0746  BP: 130/65 (!) 119/54  Pulse: 64 69  Resp: 12 16  Temp: 36.9 C (!) 36.4 C  SpO2: 98% 94%    Last Pain:  Vitals:   07/24/22 0746  TempSrc: Axillary  PainSc: 0-No pain                 Sadonna Kotara C Najia Hurlbutt

## 2022-07-24 NOTE — Op Note (Signed)
[facility] Patient Name: [name] Procedure Date: 07/24/2022 6:46 AM MRN: [id number] Date of Birth: March 09, 1966 Attending MD: Gennette Pac , MD, [id number] CSN: [id number] Age: 57 Admit Type: Outpatient Procedure:                Upper GI endoscopy Indications:              Gastric ulcer Providers:                Gennette Pac, MD, Nena Polio, RN, Pandora Leiter, Technician Referring MD:              Medicines:                Propofol per Anesthesia Complications:            No immediate complications. Estimated Blood Loss:     Estimated blood loss: none. Procedure:                Pre-Anesthesia Assessment:                           - Prior to the procedure, a History and Physical                            was performed, and patient medications and                            allergies were reviewed. The patient's tolerance of                            previous anesthesia was also reviewed. The risks                            and benefits of the procedure and the sedation                            options and risks were discussed with the patient.                            All questions were answered, and informed consent                            was obtained. Prior Anticoagulants: The patient has                            taken no anticoagulant or antiplatelet agents. ASA                            Grade Assessment: II - A patient with mild systemic                            disease. After reviewing the risks and benefits,  the patient was deemed in satisfactory condition to                            undergo the procedure.                           After obtaining informed consent, the endoscope was                            passed under direct vision. Throughout the                            procedure, the patient's blood pressure, pulse, and                            oxygen saturations were  monitored continuously. The                            [id number]-H190 ([id number]) scope was introduced through the                            mouth, and advanced to the second part of duodenum.                            The upper GI endoscopy was accomplished without                            difficulty. The patient tolerated the procedure                            well. Scope In: 7:35:58 AM Scope Out: 7:40:42 AM Total Procedure Duration: 0 hours 4 minutes 44 seconds  Findings:      3 short columns of grade 1 esophageal varices within 5 cm of the GE       junction for overlying gastric mucosa appeared normal. Stomach empty.       Diffuse changes consistent with portal gastropathy patchy erythema of       the gastric mucosa. Patient had no gastric ulcer. Previously noted ulcer       was completely healed pylorus patent easily traversed MH bulb second       portion revealed no abnormalities      The duodenal bulb and second portion of the duodenum were normal. Impression:               -Grade 1 esophageal varices. Portal hypertensive                            gastropathy. Previously noted gastric ulcer was                            completely healed.                           Normal duodenal bulb and second portion of the  duodenum.                           - No specimens collected. Moderate Sedation:      Moderate (conscious) sedation was personally administered by an       anesthesia professional. The following parameters were monitored: oxygen       saturation, heart rate, blood pressure, respiratory rate, EKG, adequacy       of pulmonary ventilation, and response to care. Recommendation:           - Patient has a contact number available for                            emergencies. The signs and symptoms of potential                            delayed complications were discussed with the                            patient. Return to normal activities tomorrow.                             Written discharge instructions were provided to the                            patient.                           - Advance diet as tolerated.                           - Continue present medications.                           - Return to my office in 5 months. Avoid all NSAID                            agents. Procedure Code(s):        --- Professional ---                           [phone], Esophagogastroduodenoscopy, flexible,                            transoral; diagnostic, including collection of                            specimen(s) by brushing or washing, when performed                            (separate procedure) Diagnosis Code(s):        --- Professional ---                           K25.9, Gastric ulcer, unspecified as acute or  chronic, without hemorrhage or perforation CPT copyright 2022 American Medical Association. All rights reserved. The codes documented in this report are preliminary and upon coder review may  be revised to meet current compliance requirements. Gerrit Friends. Lecia Esperanza, MD Gennette Pac, MD 07/24/2022 7:50:02 AM This report has been signed electronically. Number of Addenda: 0

## 2022-07-30 ENCOUNTER — Encounter (HOSPITAL_COMMUNITY): Payer: Self-pay | Admitting: Internal Medicine

## 2022-10-09 ENCOUNTER — Other Ambulatory Visit (INDEPENDENT_AMBULATORY_CARE_PROVIDER_SITE_OTHER): Payer: Self-pay

## 2022-10-09 DIAGNOSIS — A048 Other specified bacterial intestinal infections: Secondary | ICD-10-CM

## 2022-10-09 DIAGNOSIS — R1310 Dysphagia, unspecified: Secondary | ICD-10-CM

## 2022-10-09 DIAGNOSIS — R7989 Other specified abnormal findings of blood chemistry: Secondary | ICD-10-CM

## 2022-10-09 DIAGNOSIS — D500 Iron deficiency anemia secondary to blood loss (chronic): Secondary | ICD-10-CM

## 2022-10-09 DIAGNOSIS — I10 Essential (primary) hypertension: Secondary | ICD-10-CM

## 2022-10-09 DIAGNOSIS — E1159 Type 2 diabetes mellitus with other circulatory complications: Secondary | ICD-10-CM

## 2022-10-09 DIAGNOSIS — K760 Fatty (change of) liver, not elsewhere classified: Secondary | ICD-10-CM

## 2022-10-09 DIAGNOSIS — E1165 Type 2 diabetes mellitus with hyperglycemia: Secondary | ICD-10-CM

## 2022-10-09 DIAGNOSIS — K219 Gastro-esophageal reflux disease without esophagitis: Secondary | ICD-10-CM

## 2022-10-09 DIAGNOSIS — K7460 Unspecified cirrhosis of liver: Secondary | ICD-10-CM

## 2022-11-17 ENCOUNTER — Encounter: Payer: Self-pay | Admitting: Gastroenterology

## 2022-11-17 ENCOUNTER — Ambulatory Visit (INDEPENDENT_AMBULATORY_CARE_PROVIDER_SITE_OTHER): Payer: BC Managed Care – PPO | Admitting: Gastroenterology

## 2022-11-17 VITALS — BP 125/72 | HR 72 | Temp 97.1°F | Ht 65.0 in | Wt 190.3 lb

## 2022-11-17 DIAGNOSIS — K219 Gastro-esophageal reflux disease without esophagitis: Secondary | ICD-10-CM | POA: Diagnosis not present

## 2022-11-17 DIAGNOSIS — K7460 Unspecified cirrhosis of liver: Secondary | ICD-10-CM

## 2022-11-17 DIAGNOSIS — K279 Peptic ulcer, site unspecified, unspecified as acute or chronic, without hemorrhage or perforation: Secondary | ICD-10-CM | POA: Diagnosis not present

## 2022-11-17 DIAGNOSIS — D500 Iron deficiency anemia secondary to blood loss (chronic): Secondary | ICD-10-CM

## 2022-11-17 MED ORDER — PANTOPRAZOLE SODIUM 40 MG PO TBEC: 40.0000 mg | DELAYED_RELEASE_TABLET | Freq: Two times a day (BID) | ORAL | 3 refills | Status: DC

## 2022-11-17 NOTE — Progress Notes (Unsigned)
GI Office Note    Referring Provider: Elfredia Nevins, MD Primary Care Physician:  Elfredia Nevins, MD Primary Gastroenterologist: Gerrit Friends.Rourk, MD  Date:  11/17/2022  ID:  [name], DOB 1965-08-21, MRN [id number]   Chief Complaint   Chief Complaint  Patient presents with   Follow-up    Patient here today for a follow up on Cirrhosis. Patient is due today for labs. I had placed the orders to be done around today's date,but patient says she lost the orders[employer] ). Patient says she is still having some issues with nausea and constipation. She is taking Zofran prn and nothing for constipation.    History of Present Illness  [name] is a 57 y.o. female with a history of asthma, diabetes, GERD, HLD, HTN, migraines, MS, sleep apnea, cirrhosis, and anemia presenting today for follow-up.  Seen at [facility] in January by our GI service for anemia.  Hemoglobin 9.2 on admission with decreased to 7.2.  Was given 1 unit of blood in preparation for EGD and colonoscopy which she underwent on 04/23/2022, as outlined below.  Continue to have fatigue while inpatient but was able to tolerate diet without issue.  She had evidence of cirrhosis and portal hypertension on CT A/P as outlined below.  LFTs within normal limits.  MELD sodium 7.  Brief education given while inpatient and advised that we discuss further outpatient and perform additional workup as needed.  Advise follow-up in 4 weeks, CBC in 1 week post discharge, avoid NSAIDs, and continue PPI twice daily   CT A/P 04/19/2022: -Fatty filtration of the pancreas, likely related to diffuse mesenteric infiltration and cirrhosis -Morphologic changes of cirrhosis without biliary dilation.  Evidence of cholecystectomy -Sigmoid diverticulosis -Small pulmonary nodules measuring up to 5 mm -Diffuse pericolonic stranding likely related to hepatic colopathy, colitis less likely but not excluded.   EGD 04/23/22: -2 cm hiatal hernia -Nonbleeding gastric  ulcer with clean ulcer base s/p biopsy -Gastritis -Duodenal erosion   Colonoscopy 04/23/22: -Hemorrhoids on perianal exam -Sigmoid and descending diverticulosis -Normal rectum  Last office visit 05/19/2022. *** Advised to check for potential etiologies of cirrhosis including autoimmune serologies, ceruloplasmin, and ANA.  Advised to complete Carafate and continue PPI twice daily.  Plan for EGD in April.  Cirrhosis education discussed.  Advised to follow-up for RUQ ultrasound and MELD labs in 6 months.  EGD 07/24/2022: - Grade 1 esophageal varices.  - Portal hypertensive gastropathy. - Previously noted gastric ulcer was completely healed.  - Normal duodenal bulb and second portion of the duodenum.  - No specimens collected. - Advised to avoid NSAIDs  Most recent labs on 07/18/2022: Hemoglobin 9.1, A1c 7.1, unremarkable BMP   Today: Cirrhosis history Hematemesis/coffee ground emesis: none History of variceal bleeding: none Abdominal pain: None Abdominal distention/worsening ascites: none Fever/chills: none Episodes of confusion/disorientation: none Number of daily bowel movements: BM every 4 days or longer. Has bloating feeling. Loses weight after she finally goes. Sometimes a greasy meal will help but does not take anything for it.  Taking diuretics?: none Date of last EGD: April 2024, due in 2027 for surveillance Prior history of banding?: none Prior episodes of SBP: none Last time liver imaging was performed: January 2024  MELD 3.0: 13 at 07/17/2022  2:39 PM MELD-Na: 7 at 07/17/2022  2:39 PM Calculated from: Serum Creatinine: 0.85 mg/dL (Using min of 1 mg/dL) at 6/64/4034  7:42 PM Serum Sodium: 133 mmol/L at 07/17/2022  2:39 PM Total Bilirubin: 0.3 mg/dL (  Using min of 1 mg/dL) at 1/91/4782  9:56 PM Serum Albumin: 2.8 g/dL at 05/13/863  7:84 PM INR(ratio): 1.1 at 07/15/2022  7:14 PM Age at listing (hypothetical): 56 years Sex: Female at 07/17/2022  2:39 PM  Patient reports  issues with nausea as well as constipation.  Pantoprazole is helping her stomach issues. If she forgets to take it she can really tell. Usually uses zofran at least once per day. Denies any dysphagia, vomiting, diarrhea, melena or brbpr.  Current Outpatient Medications  Medication Sig Dispense Refill   ACCU-CHEK GUIDE test strip USE TO CHECK BLOOD SUGAR 4 TIMES DAILY AS DIRECTED 100 strip 0   Accu-Chek Softclix Lancets lancets USE TO CHECK BLOOD SUGAR 4 TIMES DAILY AS DIRECTED 100 each 0   acetaminophen (TYLENOL) 500 MG tablet Take 1,000 mg by mouth every 6 (six) hours as needed for moderate pain.     albuterol (ACCUNEB) 0.63 MG/3ML nebulizer solution Take 1 ampule by nebulization every 6 (six) hours as needed for wheezing or shortness of breath.     albuterol (VENTOLIN HFA) 108 (90 Base) MCG/ACT inhaler Inhale 1-2 puffs into the lungs every 6 (six) hours as needed for wheezing or shortness of breath.      baclofen (LIORESAL) 20 MG tablet Take 20 mg by mouth 3 (three) times daily.   3   blood glucose meter kit and supplies KIT Dispense based on patient and insurance preference. Use up to four times daily as directed. (FOR ICD-9 250.00, 250.01). 1 each 0   Blood Glucose Monitoring Suppl (ACCU-CHEK GUIDE ME) w/Device KIT USE TO CHECK BLOOD SUGAR DAILY AS DIRECTED 1 kit 0   citalopram (CELEXA) 40 MG tablet Take 40 mg by mouth every evening.      clopidogrel (PLAVIX) 75 MG tablet Take 75 mg by mouth daily.     Continuous Blood Gluc Receiver (DEXCOM G7 RECEIVER) DEVI Use to check glucose as directed 1 each 0   Continuous Blood Gluc Sensor (DEXCOM G7 SENSOR) MISC Change sensor every 10 days 3 each 2   gabapentin (NEURONTIN) 800 MG tablet Take 800 mg by mouth 4 (four) times daily.     insulin isophane & regular human KwikPen (NOVOLIN 70/30 KWIKPEN) (70-30) 100 UNIT/ML KwikPen Inject 10 Units into the skin 2 (two) times daily before a meal. (Patient taking differently: Inject 60 Units into the skin 2  (two) times daily before a meal.) 45 mL 2   Insulin Pen Needle (B-D ULTRAFINE III SHORT PEN) 31G X 8 MM MISC USE TO INJECT INSULIN 2 TIMES DAILY 100 each 2   lisinopril (ZESTRIL) 5 MG tablet Take 1 tablet (5 mg total) by mouth daily. 30 tablet 4   metFORMIN (GLUCOPHAGE) 1000 MG tablet Take 1 tablet (1,000 mg total) by mouth 2 (two) times daily with a meal. 60 tablet 4   metoprolol succinate (TOPROL-XL) 50 MG 24 hr tablet Take 1 tablet (50 mg total) by mouth daily. Take with or immediately following a meal. 90 tablet 3   ondansetron (ZOFRAN) 4 MG tablet Take 1 tablet (4 mg total) by mouth 4 (four) times daily as needed for nausea or vomiting. 20 tablet 0   oxycodone (ROXICODONE) 30 MG immediate release tablet Take 15 mg by mouth every 6 (six) hours as needed for pain.     pantoprazole (PROTONIX) 40 MG tablet Take 1 tablet (40 mg total) by mouth 2 (two) times daily. 60 tablet 11   pravastatin (PRAVACHOL) 40 MG tablet Take 1 tablet (  40 mg total) by mouth every evening. 30 tablet 1   rOPINIRole (REQUIP) 0.5 MG tablet Take 0.5 mg by mouth at bedtime.     sucralfate (CARAFATE) 1 g tablet Take 1 tablet (1 g total) by mouth 4 (four) times daily. (Patient not taking: Reported on 07/18/2022) 120 tablet 1   No current facility-administered medications for this visit.    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 Norma Fredrickson at [facility]   Non-adherence to medical treatment 05/14/2009   Qualifier: Diagnosis of   By: Ricard Dillon       RECTAL BLEEDING 07/05/2009   Qualifier: Diagnosis of   By: Minna Merritts       Sleep apnea    Spondylosis of thoracic region without myelopathy or radiculopathy 07/02/2014   Uncontrolled type 2 diabetes mellitus with hyperglycemia (HCC) 03/15/2017    Past Surgical History:  Procedure Laterality Date   ABDOMINAL HYSTERECTOMY     APPENDECTOMY     BACK SURGERY  03/07/2012   Nerve Stimulator   BIOPSY  04/23/2022   Procedure: BIOPSY;  Surgeon: Dolores Frame, MD;  [facility];  Service: Gastroenterology;;   CHOLECYSTECTOMY     COLONOSCOPY  07/09/2009   RMR; normal rectum aside from anal canal hemorrhoids/scattered left-sided diverticula   COLONOSCOPY WITH PROPOFOL N/A 04/23/2022   Procedure: COLONOSCOPY WITH PROPOFOL;  Surgeon: Dolores Frame, MD;  [facility];  Service: Gastroenterology;  Laterality: N/A;   ESOPHAGOGASTRODUODENOSCOPY  05/22/2009   RMR; normal /small HH   ESOPHAGOGASTRODUODENOSCOPY  2007   RMR: non-critical Schatzki's rin, non-maniuplated   ESOPHAGOGASTRODUODENOSCOPY N/A 10/24/2013   Procedure: ESOPHAGOGASTRODUODENOSCOPY (EGD);  Surgeon: Corbin Ade, MD;  [facility];  Service: Endoscopy;  Laterality: N/A;  9:45   ESOPHAGOGASTRODUODENOSCOPY N/A 04/23/2022   Procedure: ESOPHAGOGASTRODUODENOSCOPY (EGD);  Surgeon: Marguerita Merles, Reuel Boom, MD;  [facility]: [facility];  Service: Gastroenterology;   Laterality: N/A;   ESOPHAGOGASTRODUODENOSCOPY (EGD) WITH ESOPHAGEAL DILATION  03/17/2012   ZDG:LOVFIEPP appearing esophageal mucosa of uncertain significance. Status post [name] dilation followed by esophageal bx   ESOPHAGOGASTRODUODENOSCOPY (EGD) WITH PROPOFOL N/A 07/24/2022   Procedure: ESOPHAGOGASTRODUODENOSCOPY (EGD) WITH PROPOFOL;  Surgeon: Corbin Ade, MD;  [facility];  Service: Endoscopy;  Laterality: N/A;  7:30 am   MALONEY DILATION N/A 10/24/2013   Procedure: MALONEY DILATION;  Surgeon:  Corbin Ade, MD;  [facility];  Service: Endoscopy;  Laterality: N/A;   MANDIBLE SURGERY     NECK SURGERY     X2, last time 2013    Family History  Problem Relation Age of Onset   Cancer Mother        unknown type   Diabetes Father    Diabetes Sister    Colon cancer Neg Hx     Allergies as of 11/17/2022 - Review Complete 11/17/2022  Allergen Reaction Noted   Peanut-containing drug products Anaphylaxis and Hives    Shellfish allergy Anaphylaxis 02/23/2018   Gluten meal  07/11/2014   Latex Other (See Comments) 01/15/2011   Prednisone Hives and Other (See Comments)    Nexium [esomeprazole magnesium] Hives 08/15/2011   Tape Rash 06/26/2014    Social History   Socioeconomic History   Marital status: Married    Spouse name: Not on file   Number of children: Not on file   Years of education: Not on file   Highest education level: Not on file  Occupational History   Not on file  Tobacco Use   Smoking status: Never   Smokeless tobacco: Never  Vaping Use   Vaping status: Never Used  Substance and Sexual Activity   Alcohol use: Yes    Comment: only three times a year.   Drug use: No   Sexual activity: Yes    Birth control/protection: Surgical  Other Topics Concern   Not on file  Social History Narrative   5 kids, raising grandchild   Social Determinants of Health   Financial Resource Strain: Not on file  Food Insecurity: No Food Insecurity  (07/17/2022)   Hunger Vital Sign    Worried About Running Out of Food in the Last Year: Never true    Ran Out of Food in the Last Year: Never true  Recent Concern: Food Insecurity - Food Insecurity Present (04/20/2022)   Hunger Vital Sign    Worried About Running Out of Food in the Last Year: Often true    Ran Out of Food in the Last Year: Sometimes true  Transportation Needs: No Transportation Needs (07/17/2022)   PRAPARE - Administrator, Civil Service (Medical): No    Lack of Transportation (Non-Medical): No  Physical Activity: Not on file  Stress: Not on file  Social Connections: Not on file   Review of Systems   Gen: Denies fever, chills, anorexia. Denies fatigue, weakness, weight loss.  CV: Denies chest pain, palpitations, syncope, peripheral edema, and claudication. Resp: Denies dyspnea at rest, cough, wheezing, coughing up blood, and pleurisy. GI: See HPI Derm: Denies rash, itching, dry skin Psych: Denies depression, anxiety, memory loss, confusion. No homicidal or suicidal ideation.  Heme: Denies bruising, bleeding, and enlarged lymph nodes.   Physical Exam   BP 125/72 (BP Location: Left Arm, Patient Position: Sitting, Cuff Size: Large)   Pulse 72   Temp (!) 97.1 F (36.2 C) (Temporal)   Ht 5\' 5"  (1.651 m)   Wt 190 lb 4.8 oz (86.3 kg)   BMI 31.67 kg/m   General:   Alert and oriented. No distress noted. Pleasant and cooperative.  Head:  Normocephalic and atraumatic. Eyes:  Conjuctiva clear without scleral icterus. Mouth:  Oral mucosa pink and moist. Good dentition. No lesions. Lungs:  Clear to auscultation bilaterally. No wheezes, rales, or rhonchi. No distress.  Heart:  S1, S2 present without murmurs appreciated.  Abdomen:  +BS, soft, non-tender and  non-distended. No rebound or guarding. No HSM or masses noted. Rectal: *** Msk:  Symmetrical without gross deformities. Normal posture. Extremities:  Without edema. Neurologic:  Alert and  oriented  x4 Psych:  Alert and cooperative. Normal mood and affect.   Assessment  [name] is a 57 y.o. female with a history of asthma, diabetes, GERD, HLD, HTN, migraines, MS, sleep apnea, cirrhosis, and anemia presenting today for follow-up.  Cirrhosis: Originally noted in 2020.  CT scan during hospitalization earlier this year noted cirrhosis and evidence of portal hypertension with portal colopathy.  LFTs have been within normal limits and no evidence of thrombocytopenia.  Previous MELD Na score was 7.  She had recent EGD with evidence of grade 1 esophageal varices and portal hypertensive gastropathy. ***  Normocytic anemia, PUD:  PLAN   *** Start Linzess 145 mcg daily. Samples provided.  CBC, CMP, INR RUQ [place] 2g sodium diet Limit Tylenol to 2000 mg a day Continue PPI BID Continue Zofran as needed Should consider initiation of NSBB *** Follow up in 6 months.     Brooke Bonito, MSN, FNP-BC, AGACNP-BC [facility]

## 2022-11-17 NOTE — Patient Instructions (Addendum)
We are getting you scheduled for your surveillance ultrasound of your liver.  Please have blood work completed at [facility].  We will call you with results once they have been received. Please allow 3-5 business days for review. 2 locations for [facility] in [place]:              1. [address], [place]              2. [address] [place]   Cirrhosis Lifestyle Recommendations:  High-protein diet from a primarily plant-based diet. Avoid red meat.  No raw or undercooked meat, seafood, or shellfish. Low-fat/cholesterol/carbohydrate diet. Limit sodium to no more than 2000 mg/day including everything that you eat and drink. Recommend at least 30 minutes of aerobic and resistance exercise 3 days/week. Limit Tylenol to 2000 mg daily.   Continue your pantoprazole 40 mg twice daily and Zofran as needed.  Follow-up in 6 months, sooner if needed.  It was a pleasure to see you today. I want to create trusting relationships with patients. If you receive a survey regarding your visit,  I greatly appreciate you taking time to fill this out on paper or through your MyChart. I value your feedback.  Brooke Bonito, MSN, FNP-BC, AGACNP-BC [facility]

## 2022-11-24 ENCOUNTER — Ambulatory Visit (HOSPITAL_COMMUNITY)
Admission: RE | Admit: 2022-11-24 | Discharge: 2022-11-24 | Disposition: A | Discharge status: Home / Self Care | Payer: BC Managed Care – PPO | Source: Ambulatory Visit | Attending: Gastroenterology | Admitting: Gastroenterology

## 2022-11-24 DIAGNOSIS — K7460 Unspecified cirrhosis of liver: Secondary | ICD-10-CM | POA: Insufficient documentation

## 2022-11-25 LAB — COMPREHENSIVE METABOLIC PANEL
ALT: 42 IU/L — ABNORMAL HIGH (ref 0–32)
AST: 56 IU/L — ABNORMAL HIGH (ref 0–40)
Albumin: 3.5 g/dL — ABNORMAL LOW (ref 3.8–4.9)
Alkaline Phosphatase: 159 IU/L — ABNORMAL HIGH (ref 44–121)
BUN/Creatinine Ratio: 20 (ref 9–23)
BUN: 19 mg/dL (ref 6–24)
Bilirubin Total: 0.3 mg/dL (ref 0.0–1.2)
CO2: 26 mmol/L (ref 20–29)
Calcium: 8.3 mg/dL — ABNORMAL LOW (ref 8.7–10.2)
Chloride: 102 mmol/L (ref 96–106)
Creatinine, Ser: 0.96 mg/dL (ref 0.57–1.00)
Globulin, Total: 3.8 g/dL (ref 1.5–4.5)
Glucose: 185 mg/dL — ABNORMAL HIGH (ref 70–99)
Potassium: 4.8 mmol/L (ref 3.5–5.2)
Sodium: 141 mmol/L (ref 134–144)
Total Protein: 7.3 g/dL (ref 6.0–8.5)
eGFR: 69 mL/min/{1.73_m2} (ref >59)

## 2022-11-25 LAB — CBC
Hematocrit: 31 % — ABNORMAL LOW (ref 34.0–46.6)
Hemoglobin: 9.6 g/dL — ABNORMAL LOW (ref 11.1–15.9)
MCH: 25.6 pg — ABNORMAL LOW (ref 26.6–33.0)
MCHC: 31 g/dL — ABNORMAL LOW (ref 31.5–35.7)
MCV: 83 fL (ref 79–97)
Platelets: 154 10*3/uL (ref 150–450)
RBC: 3.75 x10E6/uL — ABNORMAL LOW (ref 3.77–5.28)
RDW: 14.3 % (ref 11.7–15.4)
WBC: 3.4 10*3/uL (ref 3.4–10.8)

## 2022-11-25 LAB — AFP TUMOR MARKER: AFP, Serum, Tumor Marker: <1.8 ng/mL (ref 0.0–9.2)

## 2022-11-25 LAB — PROTIME-INR
INR: 1 (ref 0.9–1.2)
Prothrombin Time: 11.1 s (ref 9.1–12.0)

## 2022-12-10 ENCOUNTER — Ambulatory Visit: Payer: BC Managed Care – PPO | Attending: Cardiology | Admitting: Cardiology

## 2022-12-10 NOTE — Progress Notes (Deleted)
Clinical Summary Ms. Pinkerman is a 57 y.o.female  seen today for follow up of the following medical problem.s    1. Chest pain - seen in 2014 by Dr Eden Emms. Around that time CT PE negative.  - reported history of esophageal spasms with prior dilatations   - 08/2016 exercise stress echo: no ischemia - 07/2016 echo  LVEF 55-60%, no WMAs, grade I diastolic dysfunction     - no recent chest pain     2. Palpitations - has had feeling of heart racing for a few weeks. Episodes few times a week.  - lasts a few minutes, can last longer - +SOB, no lightheadness or dizzines - no significant caffeine, rare wine.  - will feel heart rate elevated at times, checks and will 120s-130s.   10/2018 monitor: rare ectopy, no significant arrhythmias   3. Hyperlipidemia - 06/2016 TC 232 TG 181 HDL 55 LDL 141 - has some elevation liver enzymes. Told has a fatty liver.  - we started pravastatin 20mg  daily. Mildly elevated LFTs stable from baseline.      4. History GI bleed - Jan 2024 admission with sepsis from colitis treated rocephin and flagyl. Colonoscopy showedhemorrhoids and diverticulosis otherwise no significant acute findings. EGD showed nonbleeding gastric ulcers in the gastric body and gastric antrum with a clean ulcer base Forrest class III as well as gastritis and duodenal erosion. Hgb dropped to 6.8 and she was transfused 1 unit PRBCs. Heparin and plavix were temporarily held.   5. 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 Norma Fredrickson at [facility]   Non-adherence to medical treatment 05/14/2009   Qualifier: Diagnosis of   By: Ricard Dillon       RECTAL BLEEDING 07/05/2009   Qualifier: Diagnosis of   By: Minna Merritts       Sleep apnea    Spondylosis of thoracic region without myelopathy or radiculopathy 07/02/2014   Uncontrolled type 2 diabetes mellitus with hyperglycemia (HCC) 03/15/2017     Allergies  Allergen Reactions   Peanut-Containing Drug Products Anaphylaxis and Hives    Patient states she is allergic to all nuts   Shellfish Allergy Anaphylaxis   Gluten Meal    Latex Other (See Comments)    Reaction: blistering   Prednisone Hives and Other (  See Comments)    Reaction: causes psychological issues    Nexium [Esomeprazole Magnesium] Hives   Tape Rash    Paper tape is ok to use     Current Outpatient Medications  Medication Sig Dispense Refill   ACCU-CHEK GUIDE test strip USE TO CHECK BLOOD SUGAR 4 TIMES DAILY AS DIRECTED 100 strip 0   Accu-Chek Softclix Lancets lancets USE TO CHECK BLOOD SUGAR 4 TIMES DAILY AS DIRECTED 100 each 0   acetaminophen (TYLENOL) 500 MG tablet Take 1,000 mg by mouth every 6 (six) hours as needed for moderate pain.     albuterol (ACCUNEB) 0.63 MG/3ML nebulizer solution Take 1 ampule by nebulization every 6 (six) hours as needed for wheezing or shortness of breath.     albuterol (VENTOLIN HFA) 108 (90  Base) MCG/ACT inhaler Inhale 1-2 puffs into the lungs every 6 (six) hours as needed for wheezing or shortness of breath.      baclofen (LIORESAL) 20 MG tablet Take 20 mg by mouth 3 (three) times daily.   3   blood glucose meter kit and supplies KIT Dispense based on patient and insurance preference. Use up to four times daily as directed. (FOR ICD-9 250.00, 250.01). 1 each 0   Blood Glucose Monitoring Suppl (ACCU-CHEK GUIDE ME) w/Device KIT USE TO CHECK BLOOD SUGAR DAILY AS DIRECTED 1 kit 0   citalopram (CELEXA) 40 MG tablet Take 40 mg by mouth every evening.      clopidogrel (PLAVIX) 75 MG tablet Take 75 mg by mouth daily.     Continuous Blood Gluc Receiver (DEXCOM G7 RECEIVER) DEVI Use to check glucose as directed 1 each 0   Continuous Blood Gluc Sensor (DEXCOM G7 SENSOR) MISC Change sensor every 10 days 3 each 2   gabapentin (NEURONTIN) 800 MG tablet Take 800 mg by mouth 4 (four) times daily.     insulin isophane & regular human KwikPen (NOVOLIN 70/30 KWIKPEN) (70-30) 100 UNIT/ML KwikPen Inject 10 Units into the skin 2 (two) times daily before a meal. (Patient taking differently: Inject 60 Units into the skin 2 (two) times daily before a meal.) 45 mL 2   Insulin Pen Needle (B-D ULTRAFINE III SHORT PEN) 31G X 8 MM MISC USE TO INJECT INSULIN 2 TIMES DAILY 100 each 2   lisinopril (ZESTRIL) 5 MG tablet Take 1 tablet (5 mg total) by mouth daily. 30 tablet 4   metFORMIN (GLUCOPHAGE) 1000 MG tablet Take 1 tablet (1,000 mg total) by mouth 2 (two) times daily with a meal. 60 tablet 4   metoprolol succinate (TOPROL-XL) 50 MG 24 hr tablet Take 1 tablet (50 mg total) by mouth daily. Take with or immediately following a meal. 90 tablet 3   ondansetron (ZOFRAN) 4 MG tablet Take 1 tablet (4 mg total) by mouth 4 (four) times daily as needed for nausea or vomiting. 20 tablet 0   oxycodone (ROXICODONE) 30 MG immediate release tablet Take 15 mg by mouth every 6 (six) hours as needed for pain.     pantoprazole  (PROTONIX) 40 MG tablet Take 1 tablet (40 mg total) by mouth 2 (two) times daily. 180 tablet 3   pravastatin (PRAVACHOL) 40 MG tablet Take 1 tablet (40 mg total) by mouth every evening. 30 tablet 1   rOPINIRole (REQUIP) 0.5 MG tablet Take 0.5 mg by mouth at bedtime.     No current facility-administered medications for this visit.     Past Surgical History:  Procedure Laterality Date   ABDOMINAL HYSTERECTOMY  APPENDECTOMY     BACK SURGERY  03/07/2012   Nerve Stimulator   BIOPSY  04/23/2022   Procedure: BIOPSY;  Surgeon: Dolores Frame, MD;  Location: [facility];  Service: Gastroenterology;;   CHOLECYSTECTOMY     COLONOSCOPY  07/09/2009   RMR; normal rectum aside from anal canal hemorrhoids/scattered left-sided diverticula   COLONOSCOPY WITH PROPOFOL N/A 04/23/2022   Procedure: COLONOSCOPY WITH PROPOFOL;  Surgeon: Dolores Frame, MD;  Location: [facility];  Service: Gastroenterology;  Laterality: N/A;   ESOPHAGOGASTRODUODENOSCOPY  05/22/2009   RMR; normal /small HH   ESOPHAGOGASTRODUODENOSCOPY  2007   RMR: non-critical Schatzki's rin, non-maniuplated   ESOPHAGOGASTRODUODENOSCOPY N/A 10/24/2013   Procedure: ESOPHAGOGASTRODUODENOSCOPY (EGD);  Surgeon: Corbin Ade, MD;  Location: [facility];  Service: Endoscopy;  Laterality: N/A;  9:45   ESOPHAGOGASTRODUODENOSCOPY N/A 04/23/2022   Procedure: ESOPHAGOGASTRODUODENOSCOPY (EGD);  Surgeon: Marguerita Merles, Reuel Boom, MD;  Location: [facility];  Service: Gastroenterology;  Laterality: N/A;   ESOPHAGOGASTRODUODENOSCOPY (EGD) WITH ESOPHAGEAL DILATION  03/17/2012   [id number] appearing esophageal mucosa of uncertain significance. Status post [name] dilation followed by esophageal bx   ESOPHAGOGASTRODUODENOSCOPY (EGD) WITH PROPOFOL N/A 07/24/2022   Procedure: ESOPHAGOGASTRODUODENOSCOPY (EGD) WITH PROPOFOL;  Surgeon: Corbin Ade, MD;  Location: [facility];  Service: Endoscopy;  Laterality: N/A;  7:30 am    MALONEY DILATION N/A 10/24/2013   Procedure: [name] DILATION;  Surgeon: Corbin Ade, MD;  Location: [facility];  Service: Endoscopy;  Laterality: N/A;   MANDIBLE SURGERY     NECK SURGERY     X2, last time 2013     Allergies  Allergen Reactions   Peanut-Containing Drug Products Anaphylaxis and Hives    Patient states she is allergic to all nuts   Shellfish Allergy Anaphylaxis   Gluten Meal    Latex Other (See Comments)    Reaction: blistering   Prednisone Hives and Other (See Comments)    Reaction: causes psychological issues    Nexium [Esomeprazole Magnesium] Hives   Tape Rash    Paper tape is ok to use      Family History  Problem Relation Age of Onset   Cancer Mother        unknown type   Diabetes Father    Diabetes Sister    Colon cancer Neg Hx      Social History Ms. Nicolich reports that she has never smoked. She has never used smokeless tobacco. Ms. Sieker reports current alcohol use.   Review of Systems CONSTITUTIONAL: No weight loss, fever, chills, weakness or fatigue.  HEENT: Eyes: No visual loss, blurred vision, double vision or yellow sclerae.No hearing loss, sneezing, congestion, runny nose or sore throat.  SKIN: No rash or itching.  CARDIOVASCULAR:  RESPIRATORY: No shortness of breath, cough or sputum.  GASTROINTESTINAL: No anorexia, nausea, vomiting or diarrhea. No abdominal pain or blood.  GENITOURINARY: No burning on urination, no polyuria NEUROLOGICAL: No headache, dizziness, syncope, paralysis, ataxia, numbness or tingling in the extremities. No change in bowel or bladder control.  MUSCULOSKELETAL: No muscle, back pain, joint pain or stiffness.  LYMPHATICS: No enlarged nodes. No history of splenectomy.  PSYCHIATRIC: No history of depression or anxiety.  ENDOCRINOLOGIC: No reports of sweating, cold or heat intolerance. No polyuria or polydipsia.  Marland Kitchen   Physical Examination There were no vitals filed for this visit. There were no vitals filed  for this visit.  Gen: resting comfortably, no acute distress HEENT: no scleral icterus, pupils equal round and  reactive, no palptable cervical adenopathy,  CV Resp: Clear to auscultation bilaterally GI: abdomen is soft, non-tender, non-distended, normal bowel sounds, no hepatosplenomegaly MSK: extremities are warm, no edema.  Skin: warm, no rash Neuro:  no focal deficits Psych: appropriate affect   Diagnostic Studies  07/2016 echo Study Conclusions   - Left ventricle: The cavity size was normal. Wall thickness was   increased in a pattern of mild LVH. Systolic function was normal.   The estimated ejection fraction was in the range of 55% to 60%.   Wall motion was normal; there were no regional wall motion   abnormalities. Doppler parameters are consistent with abnormal   left ventricular relaxation (grade 1 diastolic dysfunction). - Aortic valve: Valve area (VTI): 3.3 cm^2. Valve area (Vmax): 2.73   cm^2. - Technically adequate study.   08/2016 stress echo Study Conclusions   - Stress ECG conclusions: The stress ECG was normal. - Staged echo: Resting left ventricular systolic function was   hyperdynamic, LVEF 65-70%. With stress, there was augmented   contractility of all wall segments, LVEF 85%. No evidence of   stress induced ischemia. Normal echo stress   Impressions:   - No evidence of stress induced ischemia. Exercise tolerance was   reduced. Clinical correlation indicated.   Assessment and Plan  1. Chest pain -no recent symptoms, continue to monitor   2. Palpitations - recent symptoms over the last few weeks. Heart rate by home monitor check 120 today - she will come tomorrow for a nursing visit for EKG and vitals check - pending results, may require a 1 week event monitor      Antoine Poche, M.D., F.A.C.C.

## 2022-12-11 ENCOUNTER — Encounter: Payer: Self-pay | Admitting: Cardiology

## 2022-12-11 ENCOUNTER — Encounter: Payer: Self-pay | Admitting: Dermatology

## 2022-12-18 ENCOUNTER — Observation Stay (HOSPITAL_COMMUNITY)
Admission: EM | Admit: 2022-12-18 | Discharge: 2022-12-19 | Disposition: A | Discharge status: Home / Self Care | Payer: BC Managed Care – PPO | Attending: Internal Medicine | Admitting: Internal Medicine

## 2022-12-18 ENCOUNTER — Other Ambulatory Visit: Payer: Self-pay

## 2022-12-18 ENCOUNTER — Encounter (HOSPITAL_COMMUNITY): Payer: Self-pay | Admitting: Emergency Medicine

## 2022-12-18 ENCOUNTER — Emergency Department (HOSPITAL_COMMUNITY): Payer: BC Managed Care – PPO

## 2022-12-18 DIAGNOSIS — G35D Multiple sclerosis, unspecified: Secondary | ICD-10-CM | POA: Diagnosis present

## 2022-12-18 DIAGNOSIS — N179 Acute kidney failure, unspecified: Secondary | ICD-10-CM | POA: Diagnosis present

## 2022-12-18 DIAGNOSIS — E669 Obesity, unspecified: Secondary | ICD-10-CM | POA: Diagnosis not present

## 2022-12-18 DIAGNOSIS — Z91010 Allergy to peanuts: Secondary | ICD-10-CM | POA: Diagnosis not present

## 2022-12-18 DIAGNOSIS — U071 COVID-19: Principal | ICD-10-CM | POA: Diagnosis present

## 2022-12-18 DIAGNOSIS — Z7902 Long term (current) use of antithrombotics/antiplatelets: Secondary | ICD-10-CM | POA: Insufficient documentation

## 2022-12-18 DIAGNOSIS — Z79899 Other long term (current) drug therapy: Secondary | ICD-10-CM | POA: Diagnosis not present

## 2022-12-18 DIAGNOSIS — E782 Mixed hyperlipidemia: Secondary | ICD-10-CM | POA: Diagnosis not present

## 2022-12-18 DIAGNOSIS — G894 Chronic pain syndrome: Secondary | ICD-10-CM | POA: Diagnosis not present

## 2022-12-18 DIAGNOSIS — E1159 Type 2 diabetes mellitus with other circulatory complications: Secondary | ICD-10-CM | POA: Diagnosis not present

## 2022-12-18 DIAGNOSIS — K219 Gastro-esophageal reflux disease without esophagitis: Secondary | ICD-10-CM | POA: Diagnosis present

## 2022-12-18 DIAGNOSIS — Z91040 Latex allergy status: Secondary | ICD-10-CM | POA: Insufficient documentation

## 2022-12-18 DIAGNOSIS — E7800 Pure hypercholesterolemia, unspecified: Secondary | ICD-10-CM | POA: Diagnosis not present

## 2022-12-18 DIAGNOSIS — J45909 Unspecified asthma, uncomplicated: Secondary | ICD-10-CM | POA: Diagnosis not present

## 2022-12-18 DIAGNOSIS — R509 Fever, unspecified: Secondary | ICD-10-CM | POA: Diagnosis present

## 2022-12-18 DIAGNOSIS — I10 Essential (primary) hypertension: Secondary | ICD-10-CM | POA: Diagnosis present

## 2022-12-18 DIAGNOSIS — Z794 Long term (current) use of insulin: Secondary | ICD-10-CM | POA: Diagnosis not present

## 2022-12-18 DIAGNOSIS — G35 Multiple sclerosis: Secondary | ICD-10-CM | POA: Diagnosis present

## 2022-12-18 DIAGNOSIS — Z6831 Body mass index (BMI) 31.0-31.9, adult: Secondary | ICD-10-CM | POA: Diagnosis not present

## 2022-12-18 DIAGNOSIS — F32A Depression, unspecified: Secondary | ICD-10-CM | POA: Diagnosis present

## 2022-12-18 DIAGNOSIS — Z7984 Long term (current) use of oral hypoglycemic drugs: Secondary | ICD-10-CM | POA: Insufficient documentation

## 2022-12-18 LAB — COMPREHENSIVE METABOLIC PANEL
ALT: 29 U/L (ref 0–44)
AST: 48 U/L — ABNORMAL HIGH (ref 15–41)
Albumin: 3.2 g/dL — ABNORMAL LOW (ref 3.5–5.0)
Alkaline Phosphatase: 140 U/L — ABNORMAL HIGH (ref 38–126)
Anion gap: 12 (ref 5–15)
BUN: 22 mg/dL — ABNORMAL HIGH (ref 6–20)
CO2: 25 mmol/L (ref 22–32)
Calcium: 8.6 mg/dL — ABNORMAL LOW (ref 8.9–10.3)
Chloride: 99 mmol/L (ref 98–111)
Creatinine, Ser: 1.36 mg/dL — ABNORMAL HIGH (ref 0.44–1.00)
GFR, Estimated: 45 mL/min — ABNORMAL LOW (ref >60)
Glucose, Bld: 272 mg/dL — ABNORMAL HIGH (ref 70–99)
Potassium: 4.2 mmol/L (ref 3.5–5.1)
Sodium: 136 mmol/L (ref 135–145)
Total Bilirubin: 0.4 mg/dL (ref 0.3–1.2)
Total Protein: 8 g/dL (ref 6.5–8.1)

## 2022-12-18 LAB — URINALYSIS, ROUTINE W REFLEX MICROSCOPIC
Bilirubin Urine: NEGATIVE
Glucose, UA: NEGATIVE mg/dL
Hgb urine dipstick: NEGATIVE
Ketones, ur: NEGATIVE mg/dL
Leukocytes,Ua: NEGATIVE
Nitrite: NEGATIVE
Protein, ur: NEGATIVE mg/dL
Specific Gravity, Urine: 1.011 (ref 1.005–1.030)
pH: 5 (ref 5.0–8.0)

## 2022-12-18 LAB — CBC WITH DIFFERENTIAL/PLATELET
Abs Immature Granulocytes: 0.02 10*3/uL (ref 0.00–0.07)
Basophils Absolute: 0 10*3/uL (ref 0.0–0.1)
Basophils Relative: 1 %
Eosinophils Absolute: 0.1 10*3/uL (ref 0.0–0.5)
Eosinophils Relative: 3 %
HCT: 32.5 % — ABNORMAL LOW (ref 36.0–46.0)
Hemoglobin: 9.9 g/dL — ABNORMAL LOW (ref 12.0–15.0)
Immature Granulocytes: 1 %
Lymphocytes Relative: 13 %
Lymphs Abs: 0.5 10*3/uL — ABNORMAL LOW (ref 0.7–4.0)
MCH: 25.8 pg — ABNORMAL LOW (ref 26.0–34.0)
MCHC: 30.5 g/dL (ref 30.0–36.0)
MCV: 84.6 fL (ref 80.0–100.0)
Monocytes Absolute: 0.4 10*3/uL (ref 0.1–1.0)
Monocytes Relative: 9 %
Neutro Abs: 2.8 10*3/uL (ref 1.7–7.7)
Neutrophils Relative %: 73 %
Platelets: 135 10*3/uL — ABNORMAL LOW (ref 150–400)
RBC: 3.84 MIL/uL — ABNORMAL LOW (ref 3.87–5.11)
RDW: 15.5 % (ref 11.5–15.5)
WBC: 3.9 10*3/uL — ABNORMAL LOW (ref 4.0–10.5)
nRBC: 0 % (ref 0.0–0.2)

## 2022-12-18 LAB — PROCALCITONIN: Procalcitonin: <0.1 ng/mL

## 2022-12-18 LAB — PROTIME-INR
INR: 1 (ref 0.8–1.2)
Prothrombin Time: 13 seconds (ref 11.4–15.2)

## 2022-12-18 LAB — RESP PANEL BY RT-PCR (RSV, FLU A&B, COVID)  RVPGX2
Influenza A by PCR: NEGATIVE
Influenza B by PCR: NEGATIVE
Resp Syncytial Virus by PCR: NEGATIVE
SARS Coronavirus 2 by RT PCR: POSITIVE — AB

## 2022-12-18 LAB — LACTIC ACID, PLASMA: Lactic Acid, Venous: 1.2 mmol/L (ref 0.5–1.9)

## 2022-12-18 LAB — APTT: aPTT: 27 seconds (ref 24–36)

## 2022-12-18 LAB — SEDIMENTATION RATE: Sed Rate: 55 mm/hr — ABNORMAL HIGH (ref 0–22)

## 2022-12-18 LAB — C-REACTIVE PROTEIN: CRP: 2.2 mg/dL — ABNORMAL HIGH (ref <1.0)

## 2022-12-18 LAB — GLUCOSE, CAPILLARY: Glucose-Capillary: 199 mg/dL — ABNORMAL HIGH (ref 70–99)

## 2022-12-18 MED ORDER — ONDANSETRON HCL 4 MG PO TABS: 4.0000 mg | ORAL_TABLET | Freq: Four times a day (QID) | ORAL | Status: DC | PRN

## 2022-12-18 MED ORDER — METOPROLOL SUCCINATE ER 50 MG PO TB24
50.0000 mg | ORAL_TABLET | Freq: Every day | ORAL | Status: DC
  Administered 2022-12-18 – 2022-12-19 (×2): 50 mg via ORAL
  Filled 2022-12-18 (×2): qty 1

## 2022-12-18 MED ORDER — CLOPIDOGREL BISULFATE 75 MG PO TABS
75.0000 mg | ORAL_TABLET | Freq: Every day | ORAL | Status: DC
  Administered 2022-12-18 – 2022-12-19 (×2): 75 mg via ORAL
  Filled 2022-12-18 (×2): qty 1

## 2022-12-18 MED ORDER — ROPINIROLE HCL 0.25 MG PO TABS
0.5000 mg | ORAL_TABLET | Freq: Every day | ORAL | Status: DC
  Administered 2022-12-18: 0.5 mg via ORAL
  Filled 2022-12-18: qty 2

## 2022-12-18 MED ORDER — PRAVASTATIN SODIUM 40 MG PO TABS
40.0000 mg | ORAL_TABLET | Freq: Every evening | ORAL | Status: DC
  Administered 2022-12-18: 40 mg via ORAL
  Filled 2022-12-18: qty 1

## 2022-12-18 MED ORDER — ONDANSETRON HCL 4 MG/2ML IJ SOLN: 4.0000 mg | Freq: Four times a day (QID) | INTRAMUSCULAR | Status: DC | PRN

## 2022-12-18 MED ORDER — ONDANSETRON 4 MG PO TBDP
4.0000 mg | ORAL_TABLET | Freq: Once | ORAL | Status: AC
  Administered 2022-12-18: 4 mg via ORAL
  Filled 2022-12-18: qty 1

## 2022-12-18 MED ORDER — INSULIN ISOPHANE & REGULAR (HUMAN 70-30)100 UNIT/ML KWIKPEN: 60.0000 [IU] | PEN_INJECTOR | Freq: Two times a day (BID) | SUBCUTANEOUS | Status: DC

## 2022-12-18 MED ORDER — GABAPENTIN 400 MG PO CAPS
800.0000 mg | ORAL_CAPSULE | Freq: Four times a day (QID) | ORAL | Status: DC
  Administered 2022-12-18 – 2022-12-19 (×3): 800 mg via ORAL
  Filled 2022-12-18 (×3): qty 2

## 2022-12-18 MED ORDER — NIRMATRELVIR/RITONAVIR (PAXLOVID)TABLET: 3.0000 | ORAL_TABLET | Freq: Two times a day (BID) | ORAL | Status: DC

## 2022-12-18 MED ORDER — CITALOPRAM HYDROBROMIDE 20 MG PO TABS
40.0000 mg | ORAL_TABLET | Freq: Every evening | ORAL | Status: DC
  Administered 2022-12-18: 40 mg via ORAL
  Filled 2022-12-18: qty 2

## 2022-12-18 MED ORDER — PANTOPRAZOLE SODIUM 40 MG PO TBEC
40.0000 mg | DELAYED_RELEASE_TABLET | Freq: Two times a day (BID) | ORAL | Status: DC
  Administered 2022-12-18 – 2022-12-19 (×2): 40 mg via ORAL
  Filled 2022-12-18 (×2): qty 1

## 2022-12-18 MED ORDER — BACLOFEN 10 MG PO TABS
20.0000 mg | ORAL_TABLET | Freq: Three times a day (TID) | ORAL | Status: DC
  Administered 2022-12-18 – 2022-12-19 (×3): 20 mg via ORAL
  Filled 2022-12-18 (×3): qty 2

## 2022-12-18 MED ORDER — NIRMATRELVIR/RITONAVIR (PAXLOVID) TABLET (RENAL DOSING)
2.0000 | ORAL_TABLET | Freq: Two times a day (BID) | ORAL | Status: DC
  Administered 2022-12-18 – 2022-12-19 (×2): 2 via ORAL
  Filled 2022-12-18: qty 20

## 2022-12-18 MED ORDER — OXYCODONE HCL 5 MG PO TABS
15.0000 mg | ORAL_TABLET | Freq: Four times a day (QID) | ORAL | Status: DC | PRN
  Administered 2022-12-18 – 2022-12-19 (×3): 15 mg via ORAL
  Filled 2022-12-18 (×3): qty 3

## 2022-12-18 MED ORDER — LACTATED RINGERS IV BOLUS (SEPSIS)
1000.0000 mL | Freq: Once | INTRAVENOUS | Status: AC
  Administered 2022-12-18: 1000 mL via INTRAVENOUS

## 2022-12-18 MED ORDER — SODIUM CHLORIDE 0.9 % IV SOLN
2.0000 g | INTRAVENOUS | Status: DC
  Administered 2022-12-18: 2 g via INTRAVENOUS
  Filled 2022-12-18 (×2): qty 20

## 2022-12-18 MED ORDER — INSULIN ASPART PROT & ASPART (70-30 MIX) 100 UNIT/ML ~~LOC~~ SUSP
60.0000 [IU] | Freq: Two times a day (BID) | SUBCUTANEOUS | Status: DC
  Filled 2022-12-18: qty 10

## 2022-12-18 MED ORDER — ACETAMINOPHEN 500 MG PO TABS: 1000.0000 mg | ORAL_TABLET | Freq: Four times a day (QID) | ORAL | Status: DC | PRN

## 2022-12-18 MED ORDER — LACTATED RINGERS IV SOLN: INTRAVENOUS | Status: AC

## 2022-12-18 MED ORDER — ALBUTEROL SULFATE (2.5 MG/3ML) 0.083% IN NEBU
3.0000 mL | INHALATION_SOLUTION | Freq: Four times a day (QID) | RESPIRATORY_TRACT | Status: DC | PRN
  Administered 2022-12-18: 3 mL via RESPIRATORY_TRACT
  Filled 2022-12-18: qty 6

## 2022-12-18 MED ORDER — ACETAMINOPHEN 325 MG PO TABS
650.0000 mg | ORAL_TABLET | Freq: Once | ORAL | Status: AC
  Administered 2022-12-18: 650 mg via ORAL
  Filled 2022-12-18: qty 2

## 2022-12-18 MED ORDER — LACTATED RINGERS IV SOLN: INTRAVENOUS | Status: DC

## 2022-12-18 NOTE — ED Provider Notes (Addendum)
[place] EMERGENCY DEPARTMENT AT [facility] Provider Note   CSN: [id number] Arrival date & time: 12/18/22  7829     History  Chief Complaint  Patient presents with   Emesis    [name] is a 57 y.o. female.  HPI     57 y.o. female with a history of asthma, diabetes, GERD, HLD, HTN, migraines, MS, sleep apnea, cirrhosis, and anemia presenting fevers, sweats. Pt feels the same way as last time when she had bacteremia. PT denies CP, shob, uti like symptoms, new abd pain or diarrhea. PT does have new back pain in the lower spine x 2 days. Pt has restless less syndrome and denies new numbness, weakness, urinary incontinence, urinary retention, bowel incontinence, pins and needle sensation in the perineal area. She also reports "jerking" - similar to when she had sepsis last year.   Home Medications Prior to Admission medications   Medication Sig Start Date End Date Taking? Authorizing Provider  ACCU-CHEK GUIDE test strip USE TO CHECK BLOOD SUGAR 4 TIMES DAILY AS DIRECTED 03/06/22   Nida, Denman George, MD  Accu-Chek Softclix Lancets lancets USE TO CHECK BLOOD SUGAR 4 TIMES DAILY AS DIRECTED 03/06/22   Nida, Denman George, MD  acetaminophen (TYLENOL) 500 MG tablet Take 1,000 mg by mouth every 6 (six) hours as needed for moderate pain.    [provider]  albuterol (ACCUNEB) 0.63 MG/3ML nebulizer solution Take 1 ampule by nebulization every 6 (six) hours as needed for wheezing or shortness of breath. 11/24/21   [provider]  albuterol (VENTOLIN HFA) 108 (90 Base) MCG/ACT inhaler Inhale 1-2 puffs into the lungs every 6 (six) hours as needed for wheezing or shortness of breath.  01/02/19   [provider]  baclofen (LIORESAL) 20 MG tablet Take 20 mg by mouth 3 (three) times daily.  05/08/14   [provider]  blood glucose meter kit and supplies KIT Dispense based on patient and insurance preference. Use up to four times daily as directed.  (FOR ICD-9 250.00, 250.01). 03/15/19   Joseph Art, DO  Blood Glucose Monitoring Suppl (ACCU-CHEK GUIDE ME) w/Device KIT USE TO CHECK BLOOD SUGAR DAILY AS DIRECTED 12/31/21   Nida, Denman George, MD  citalopram (CELEXA) 40 MG tablet Take 40 mg by mouth every evening.     [provider]  clopidogrel (PLAVIX) 75 MG tablet Take 75 mg by mouth daily. 11/27/21   [provider]  Continuous Blood Gluc Receiver (DEXCOM G7 RECEIVER) DEVI Use to check glucose as directed 10/28/21   Roma Kayser, MD  Continuous Blood Gluc Sensor (DEXCOM G7 SENSOR) MISC Change sensor every 10 days 01/19/22   Roma Kayser, MD  gabapentin (NEURONTIN) 800 MG tablet Take 800 mg by mouth 4 (four) times daily. 01/02/19   [provider]  insulin isophane & regular human KwikPen (NOVOLIN 70/30 KWIKPEN) (70-30) 100 UNIT/ML KwikPen Inject 10 Units into the skin 2 (two) times daily before a meal. Patient taking differently: Inject 60 Units into the skin 2 (two) times daily before a meal. 12/02/21   Erick Blinks, MD  Insulin Pen Needle (B-D ULTRAFINE III SHORT PEN) 31G X 8 MM MISC USE TO INJECT INSULIN 2 TIMES DAILY 06/19/22   Roma Kayser, MD  lisinopril (ZESTRIL) 5 MG tablet Take 1 tablet (5 mg total) by mouth daily. 04/25/22   Shon Hale, MD  metFORMIN (GLUCOPHAGE) 1000 MG tablet Take 1 tablet (1,000 mg total) by mouth 2 (two)  times daily with a meal. 04/25/22   Emokpae, Courage, MD  metoprolol succinate (TOPROL-XL) 50 MG 24 hr tablet Take 1 tablet (50 mg total) by mouth daily. Take with or immediately following a meal. 04/25/22 04/20/23  Shon Hale, MD  ondansetron (ZOFRAN) 4 MG tablet Take 1 tablet (4 mg total) by mouth 4 (four) times daily as needed for nausea or vomiting. 04/25/22   Shon Hale, MD  oxycodone (ROXICODONE) 30 MG immediate release tablet Take 15 mg by mouth every 6 (six) hours as needed for pain. 10/31/21   [provider]  pantoprazole  (PROTONIX) 40 MG tablet Take 1 tablet (40 mg total) by mouth 2 (two) times daily. 11/17/22 11/17/23  Aida Raider, NP  pravastatin (PRAVACHOL) 40 MG tablet Take 1 tablet (40 mg total) by mouth every evening. 03/15/19   Black, Lesle Chris, NP  rOPINIRole (REQUIP) 0.5 MG tablet Take 0.5 mg by mouth at bedtime. 11/27/21   [provider]      Allergies    Peanut-containing drug products, Shellfish allergy, Gluten meal, Latex, Prednisone, Nexium [esomeprazole magnesium], and Tape    Review of Systems   Review of Systems  All other systems reviewed and are negative.   Physical Exam Updated Vital Signs BP (!) 148/73   Pulse 78   Temp 98.8 F (37.1 C)   Resp 17   Ht 5\' 5"  (1.651 m)   Wt 86.2 kg   SpO2 93%   BMI 31.62 kg/m  Physical Exam Vitals and nursing note reviewed.  Constitutional:      Appearance: She is well-developed.  HENT:     Head: Atraumatic.  Eyes:     Extraocular Movements: Extraocular movements intact.     Pupils: Pupils are equal, round, and reactive to light.  Cardiovascular:     Rate and Rhythm: Normal rate.  Pulmonary:     Effort: Pulmonary effort is normal.     Breath sounds: No wheezing.  Abdominal:     Tenderness: There is abdominal tenderness.  Musculoskeletal:     Cervical back: Normal range of motion and neck supple.  Skin:    General: Skin is warm and dry.  Neurological:     Mental Status: She is alert and oriented to person, place, and time.     ED Results / Procedures / Treatments   Labs (all labs ordered are listed, but only abnormal results are displayed) Labs Reviewed  RESP PANEL BY RT-PCR (RSV, FLU A&B, COVID)  [id number] - Abnormal; Notable for the following components:      Result Value   SARS Coronavirus 2 by RT PCR POSITIVE (*)    All other components within normal limits  COMPREHENSIVE METABOLIC PANEL - Abnormal; Notable for the following components:   Glucose, Bld 272 (*)    BUN 22 (*)    Creatinine, Ser 1.36 (*)     Calcium 8.6 (*)    Albumin 3.2 (*)    AST 48 (*)    Alkaline Phosphatase 140 (*)    GFR, Estimated 45 (*)    All other components within normal limits  CBC WITH DIFFERENTIAL/PLATELET - Abnormal; Notable for the following components:   WBC 3.9 (*)    RBC 3.84 (*)    Hemoglobin 9.9 (*)    HCT 32.5 (*)    MCH 25.8 (*)    Platelets 135 (*)    Lymphs Abs 0.5 (*)    All other components within normal limits  SEDIMENTATION RATE - Abnormal;  Notable for the following components:   Sed Rate 55 (*)    All other components within normal limits  C-REACTIVE PROTEIN - Abnormal; Notable for the following components:   CRP 2.2 (*)    All other components within normal limits  CULTURE, BLOOD (ROUTINE X 2)  URINE CULTURE  LACTIC ACID, PLASMA  PROTIME-INR  APTT  URINALYSIS, ROUTINE W REFLEX MICROSCOPIC    EKG None  Radiology DG Chest Port 1 View  Result Date: 12/18/2022 CLINICAL DATA:  Questionable sepsis EXAM: PORTABLE CHEST 1 VIEW COMPARISON:  Chest radiograph 07/15/2022 FINDINGS: The heart is borderline enlarged, unchanged. The upper mediastinal contours are normal. There is no vascular congestion without evidence of overt pulmonary edema. There is no focal consolidation. There is no pleural effusion or pneumothorax There is no acute osseous abnormality. Spinal stimulator leads are again noted. A linear metallic density object projecting over the left mediastinum is of uncertain etiology. IMPRESSION: 1. Borderline cardiomegaly with vascular congestion but no definite overt pulmonary edema. No focal airspace consolidation. 2. Linear metallic density object projecting over the left mediastinum is of uncertain origin, possibly external to the patient. Electronically Signed   By: Lesia Hausen M.D.   On: 12/18/2022 08:21    Procedures Procedures    Medications Ordered in ED Medications  lactated ringers infusion ( Intravenous New Bag/Given 12/18/22 1132)  cefTRIAXone (ROCEPHIN) 2 g in sodium  chloride 0.9 % 100 mL IVPB (2 g Intravenous New Bag/Given 12/18/22 0945)  lactated ringers bolus 1,000 mL (0 mLs Intravenous Stopped 12/18/22 1132)  ondansetron (ZOFRAN-ODT) disintegrating tablet 4 mg (4 mg Oral Given 12/18/22 1132)  acetaminophen (TYLENOL) tablet 650 mg (650 mg Oral Given 12/18/22 1312)    ED Course/ Medical Decision Making/ A&P                                 Medical Decision Making Amount and/or Complexity of Data Reviewed Labs: ordered. Radiology: ordered. ECG/medicine tests: ordered.  Risk OTC drugs. Prescription drug management. Decision regarding hospitalization.   57 year old female with previous history of sepsis and bacteremia comes in with chief complaint of chills, shaking, weakness.  Patient has history of diabetes, Elita Boone liver cirrhosis, diabetes she has had at least 2 admissions for bacteremia and sepsis in the last year.  Patient indicates that she started feeling weak yesterday.  This morning she started having some chills with rigors and nausea with vomiting.  Differential includes uti sepsis, bacteremia, ileus. Abd exam is reassuring.  I ordered basic labs.  We will start antibiotics given that patient has demonstrated bacteremia with similar symptoms.  At that time we will discharge her and she had to come back for bacteremia.  IV ceftriaxone ordered.  Reassessment: Patient's COVID-19 test is positive.  UA looks clean. P.o. challenge initiated.  Reassessment: Patient actually has passed oral challenge, but she now has shortness of breath.  She continues to still have nausea.  She is not comfortable going home.  Patient has multiple comorbidities, it would be best to admit her for abs for COVID-19.  Final Clinical Impression(s) / ED Diagnoses Final diagnoses:  COVID-19    Rx / DC Orders ED Discharge Orders     None         Derwood Kaplan, MD 12/18/22 1405    Derwood Kaplan, MD 12/18/22 1406

## 2022-12-18 NOTE — ED Notes (Signed)
Nurse noted labs drawn first set of [facility] pending, antibiotic started after IV started. Lab came in and asked if second set of BC was drawn and  nurse stated no because an emergency came in and nurse had not been able to get back into room to draw second set. Nurse started second set under the impression both sets had been drawn.

## 2022-12-18 NOTE — ED Notes (Signed)
Patient provided water for po challenge

## 2022-12-18 NOTE — ED Triage Notes (Signed)
Pt BIB [facility] for spasms that stopped upon entering the EMS truck, c/o emesis and intermittent spasms, hx of MS, v/s en route: 145/77, HR74, 98%, QM57

## 2022-12-18 NOTE — Sepsis Progress Note (Signed)
eLink is following this Code Sepsis.

## 2022-12-18 NOTE — ED Notes (Signed)
Nurse attempted to get second line but it would not thread x 2.

## 2022-12-18 NOTE — H&P (Signed)
History and Physical    MERLINDA MCCLEAN ZOX:[id number] DOB: 14-Sep-1965 DOA: 12/18/2022  PCP: Elfredia Nevins, MD   Patient coming from: Home  Chief Complaint: SOB/Fever/Rigors  HPI: [name] is a 57 y.o. female with medical history significant for type 2 diabetes, hypertension, multiple sclerosis, asthma, chronic pain, and sleep apnea who presented to the ED with complaints of fevers, rigors, and sweats.  She also had some nausea and vomiting earlier in the emergency department and is developing some mild shortness of breath.  She is also noted to have some lower back pain over the last 2 days and has restless leg syndrome, but denies any new weakness, numbness, or urinary incontinence or bowel incontinence.   ED Course: Vital signs stable and patient afebrile.  Urine analysis negative for any acute findings and lactic acid 1.2.  Chest x-ray with some mild cardiomegaly and pulmonary vascular congestion and temperature noted to be 99.  COVID testing is positive and patient noted to have mild AKI with creatinine 1.32.  Review of Systems: Reviewed as noted above, otherwise negative.  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 Norma Fredrickson at [facility]   Non-adherence to medical treatment 05/14/2009   Qualifier: Diagnosis of   By: Ricard Dillon       RECTAL BLEEDING 07/05/2009   Qualifier: Diagnosis of   By: Minna Merritts       Sleep apnea    Spondylosis of thoracic region without myelopathy or radiculopathy 07/02/2014   Uncontrolled type 2 diabetes mellitus with hyperglycemia (HCC) 03/15/2017    Past Surgical History:  Procedure Laterality Date   ABDOMINAL HYSTERECTOMY     APPENDECTOMY     BACK SURGERY  03/07/2012   Nerve Stimulator   BIOPSY  04/23/2022   Procedure: BIOPSY;  Surgeon: Dolores Frame, MD;  Location: [facility];  Service: Gastroenterology;;   CHOLECYSTECTOMY     COLONOSCOPY  07/09/2009   RMR; normal rectum aside from anal canal hemorrhoids/scattered left-sided diverticula   COLONOSCOPY WITH PROPOFOL N/A 04/23/2022   Procedure: COLONOSCOPY WITH PROPOFOL;  Surgeon: Dolores Frame, MD;  Location: [facility];  Service: Gastroenterology;  Laterality: N/A;   ESOPHAGOGASTRODUODENOSCOPY  05/22/2009   RMR; normal /small HH   ESOPHAGOGASTRODUODENOSCOPY  2007   RMR: non-critical Schatzki's rin, non-maniuplated   ESOPHAGOGASTRODUODENOSCOPY N/A 10/24/2013   Procedure: ESOPHAGOGASTRODUODENOSCOPY (EGD);  Surgeon: Corbin Ade, MD;  Location: [facility];  Service: Endoscopy;  Laterality: N/A;  9:45   ESOPHAGOGASTRODUODENOSCOPY N/A 04/23/2022   Procedure: ESOPHAGOGASTRODUODENOSCOPY (EGD);  Surgeon: Marguerita Merles, Reuel Boom, MD;  Location: [facility];  Service: Gastroenterology;  Laterality: N/A;   ESOPHAGOGASTRODUODENOSCOPY (EGD) WITH ESOPHAGEAL DILATION  03/17/2012   BMW:[id number] appearing esophageal mucosa of uncertain significance. Status post [name]  dilation followed by esophageal bx   ESOPHAGOGASTRODUODENOSCOPY (EGD) WITH PROPOFOL N/A 07/24/2022   Procedure: ESOPHAGOGASTRODUODENOSCOPY (EGD) WITH PROPOFOL;  Surgeon: Corbin Ade, MD;  Location: [facility];  Service: Endoscopy;  Laterality: N/A;  7:30 am   MALONEY DILATION N/A 10/24/2013   Procedure: [name] DILATION;  Surgeon: Corbin Ade, MD;  Location: [facility];  Service: Endoscopy;  Laterality: N/A;   MANDIBLE SURGERY     NECK SURGERY     X2, last time 2013     reports that she has never smoked. She has never used smokeless tobacco. She reports current alcohol use. She reports that she does not use drugs.  Allergies  Allergen Reactions   Peanut-Containing Drug Products Anaphylaxis and Hives    Patient states she is allergic to all nuts   Shellfish Allergy Anaphylaxis   Gluten Meal    Latex Other (See Comments)    Reaction: blistering   Prednisone Hives and Other (See Comments)    Reaction: causes psychological issues    Nexium [Esomeprazole Magnesium] Hives   Tape Rash    Paper tape is ok to use    Family History  Problem Relation Age of Onset   Cancer Mother        unknown type   Diabetes Father    Diabetes Sister    Colon cancer Neg Hx     Prior to Admission medications   Medication Sig Start Date End Date Taking? Authorizing Provider  ACCU-CHEK GUIDE test strip USE TO CHECK BLOOD SUGAR 4 TIMES DAILY AS DIRECTED 03/06/22   Nida, Denman George, MD  Accu-Chek Softclix Lancets lancets USE TO CHECK BLOOD SUGAR 4 TIMES DAILY AS DIRECTED 03/06/22   Nida, Denman George, MD  acetaminophen (TYLENOL) 500 MG tablet Take 1,000 mg by mouth every 6 (six) hours as needed for moderate pain.    [provider]  albuterol (ACCUNEB) 0.63 MG/3ML nebulizer solution Take 1 ampule by nebulization every 6 (six) hours as needed for wheezing or shortness of breath. 11/24/21   [provider]  albuterol (VENTOLIN HFA) 108 (90 Base) MCG/ACT inhaler Inhale 1-2  puffs into the lungs every 6 (six) hours as needed for wheezing or shortness of breath.  01/02/19   [provider]  baclofen (LIORESAL) 20 MG tablet Take 20 mg by mouth 3 (three) times daily.  05/08/14   [provider]  blood glucose meter kit and supplies KIT Dispense based on patient and insurance preference. Use up to four times daily as directed. (FOR ICD-9 250.00, 250.01). 03/15/19   Joseph Art, DO  Blood Glucose Monitoring Suppl (ACCU-CHEK GUIDE ME) w/Device KIT USE TO CHECK BLOOD SUGAR DAILY AS DIRECTED 12/31/21   Nida, Denman George, MD  citalopram (CELEXA) 40 MG tablet Take 40 mg by mouth every evening.     [provider]  clopidogrel (PLAVIX) 75 MG tablet Take 75 mg by mouth daily. 11/27/21   [provider]  Continuous Blood Gluc Receiver (DEXCOM G7 RECEIVER) DEVI Use to check glucose as directed 10/28/21   Roma Kayser, MD  Continuous Blood  Gluc Sensor (DEXCOM G7 SENSOR) MISC Change sensor every 10 days 01/19/22   Roma Kayser, MD  gabapentin (NEURONTIN) 800 MG tablet Take 800 mg by mouth 4 (four) times daily. 01/02/19   [provider]  insulin isophane & regular human KwikPen (NOVOLIN 70/30 KWIKPEN) (70-30) 100 UNIT/ML KwikPen Inject 10 Units into the skin 2 (two) times daily before a meal. Patient taking differently: Inject 60 Units into the skin 2 (two) times daily before a meal. 12/02/21   Erick Blinks, MD  Insulin Pen Needle (B-D ULTRAFINE III SHORT PEN) 31G X 8 MM MISC USE TO INJECT INSULIN 2 TIMES DAILY 06/19/22   Roma Kayser, MD  lisinopril (ZESTRIL) 5 MG tablet Take 1 tablet (5 mg total) by mouth daily. 04/25/22   Shon Hale, MD  metFORMIN (GLUCOPHAGE) 1000 MG tablet Take 1 tablet (1,000 mg total) by mouth 2 (two) times daily with a meal. 04/25/22   Emokpae, Courage, MD  metoprolol succinate (TOPROL-XL) 50 MG 24 hr tablet Take 1 tablet (50 mg total) by mouth daily. Take with or immediately following a  meal. 04/25/22 04/20/23  Shon Hale, MD  ondansetron (ZOFRAN) 4 MG tablet Take 1 tablet (4 mg total) by mouth 4 (four) times daily as needed for nausea or vomiting. 04/25/22   Shon Hale, MD  oxycodone (ROXICODONE) 30 MG immediate release tablet Take 15 mg by mouth every 6 (six) hours as needed for pain. 10/31/21   [provider]  pantoprazole (PROTONIX) 40 MG tablet Take 1 tablet (40 mg total) by mouth 2 (two) times daily. 11/17/22 11/17/23  Aida Raider, NP  pravastatin (PRAVACHOL) 40 MG tablet Take 1 tablet (40 mg total) by mouth every evening. 03/15/19   Black, Lesle Chris, NP  rOPINIRole (REQUIP) 0.5 MG tablet Take 0.5 mg by mouth at bedtime. 11/27/21   [provider]    Physical Exam: Vitals:   12/18/22 1300 12/18/22 1330 12/18/22 1345 12/18/22 1400  BP: (!) 171/81 (!) 155/73 (!) 164/81 (!) 148/73  Pulse: 84 78 84 78  Resp: 13 16 13 17   Temp:    98.8 F (37.1 C)  TempSrc:      SpO2: 95% 95% 97% 93%  Weight:      Height:        Constitutional: NAD, calm, comfortable Vitals:   12/18/22 1300 12/18/22 1330 12/18/22 1345 12/18/22 1400  BP: (!) 171/81 (!) 155/73 (!) 164/81 (!) 148/73  Pulse: 84 78 84 78  Resp: 13 16 13 17   Temp:    98.8 F (37.1 C)  TempSrc:      SpO2: 95% 95% 97% 93%  Weight:      Height:       Eyes: lids and conjunctivae normal Neck: normal, supple Respiratory: clear to auscultation bilaterally. Normal respiratory effort. No accessory muscle use.  Cardiovascular: Regular rate and rhythm, no murmurs. Abdomen: no tenderness, no distention. Bowel sounds positive.  Musculoskeletal:  No edema. Skin: no rashes, lesions, ulcers.  Psychiatric: Flat affect  Labs on Admission: I have personally reviewed following labs and imaging studies  CBC: Recent Labs  Lab 12/18/22 0754  WBC 3.9*  NEUTROABS 2.8  HGB 9.9*  HCT 32.5*  MCV 84.6  PLT 135*   Basic Metabolic Panel: Recent Labs  Lab 12/18/22 0754  NA 136  K 4.2  CL 99  CO2  25  GLUCOSE 272*  BUN 22*  CREATININE 1.36*  CALCIUM 8.6*   GFR: Estimated Creatinine Clearance: 49.5 mL/min (A) (  by C-G formula based on SCr of 1.36 mg/dL (H)). Liver Function Tests: Recent Labs  Lab 12/18/22 0754  AST 48*  ALT 29  ALKPHOS 140*  BILITOT 0.4  PROT 8.0  ALBUMIN 3.2*   No results for input(s): "LIPASE", "AMYLASE" in the last 168 hours. No results for input(s): "AMMONIA" in the last 168 hours. Coagulation Profile: Recent Labs  Lab 12/18/22 0754  INR 1.0   Cardiac Enzymes: No results for input(s): "CKTOTAL", "CKMB", "CKMBINDEX", "TROPONINI" in the last 168 hours. BNP (last 3 results) No results for input(s): "PROBNP" in the last 8760 hours. HbA1C: No results for input(s): "HGBA1C" in the last 72 hours. CBG: No results for input(s): "GLUCAP" in the last 168 hours. Lipid Profile: No results for input(s): "CHOL", "HDL", "LDLCALC", "TRIG", "CHOLHDL", "LDLDIRECT" in the last 72 hours. Thyroid Function Tests: No results for input(s): "TSH", "T4TOTAL", "FREET4", "T3FREE", "THYROIDAB" in the last 72 hours. Anemia Panel: No results for input(s): "VITAMINB12", "FOLATE", "FERRITIN", "TIBC", "IRON", "RETICCTPCT" in the last 72 hours. Urine analysis:    Component Value Date/Time   COLORURINE YELLOW 12/18/2022 1131   APPEARANCEUR CLEAR 12/18/2022 1131   LABSPEC 1.011 12/18/2022 1131   PHURINE 5.0 12/18/2022 1131   GLUCOSEU NEGATIVE 12/18/2022 1131   HGBUR NEGATIVE 12/18/2022 1131   BILIRUBINUR NEGATIVE 12/18/2022 1131   KETONESUR NEGATIVE 12/18/2022 1131   PROTEINUR NEGATIVE 12/18/2022 1131   UROBILINOGEN 1.0 06/26/2014 1720   NITRITE NEGATIVE 12/18/2022 1131   LEUKOCYTESUR NEGATIVE 12/18/2022 1131    Radiological Exams on Admission: DG Chest Port 1 View  Result Date: 12/18/2022 CLINICAL DATA:  Questionable sepsis EXAM: PORTABLE CHEST 1 VIEW COMPARISON:  Chest radiograph 07/15/2022 FINDINGS: The heart is borderline enlarged, unchanged. The upper mediastinal  contours are normal. There is no vascular congestion without evidence of overt pulmonary edema. There is no focal consolidation. There is no pleural effusion or pneumothorax There is no acute osseous abnormality. Spinal stimulator leads are again noted. A linear metallic density object projecting over the left mediastinum is of uncertain etiology. IMPRESSION: 1. Borderline cardiomegaly with vascular congestion but no definite overt pulmonary edema. No focal airspace consolidation. 2. Linear metallic density object projecting over the left mediastinum is of uncertain origin, possibly external to the patient. Electronically Signed   By: Lesia Hausen M.D.   On: 12/18/2022 08:21    EKG: Independently reviewed.  SR 74 bpm.  Assessment/Plan Principal Problem:   COVID-19 Active Problems:   Asthma   Esophageal reflux   HTN (hypertension)   Mixed hyperlipidemia   Multiple sclerosis (HCC)   DM type 2 causing vascular disease (HCC)   Depression   AKI (acute kidney injury) (HCC)   Chronic pain syndrome   Hypercholesterolemia    Symptomatic COVID infection -Start Paxlovid -Noted to have some nausea and vomiting -No significant hypoxemia or findings on chest x-ray, avoid steroids -Isolation precautions -Monitor labs  Mild AKI-likely prerenal -Avoid nephrotoxic agents -Administer IV fluid -Recheck labs in a.m. and monitor  Asthma -Breathing treatments as needed for wheezing or shortness of breath  Hypertension -Continue metoprolol  Type 2 diabetes -Sliding scale -Carb modified diet  Multiple sclerosis -Can continue baclofen  GERD -PPI  Dyslipidemia -Statin  Obesity -BMI 31.62 -Lifestyle changes outpatient  Chronic pain syndrome -Continue pain medications  DVT prophylaxis: SCDs Code Status: Full Family Communication: None Disposition Plan: Admit for observation on Paxlovid Consults called: None Admission status: Observation, telemetry  Severity of Illness: The  appropriate patient status for this patient is OBSERVATION. Observation  status is judged to be reasonable and necessary in order to provide the required intensity of service to ensure the patient's safety. The patient's presenting symptoms, physical exam findings, and initial radiographic and laboratory data in the context of their medical condition is felt to place them at decreased risk for further clinical deterioration. Furthermore, it is anticipated that the patient will be medically stable for discharge from the hospital within 2 midnights of admission.    Canaan Holzer D Sherryll Burger DO [facility]  If 7PM-7AM, please contact night-coverage [email or web address]  12/18/2022, 2:10 PM

## 2022-12-19 DIAGNOSIS — U071 COVID-19: Secondary | ICD-10-CM | POA: Diagnosis not present

## 2022-12-19 LAB — CBC
HCT: 29.4 % — ABNORMAL LOW (ref 36.0–46.0)
Hemoglobin: 8.9 g/dL — ABNORMAL LOW (ref 12.0–15.0)
MCH: 25.6 pg — ABNORMAL LOW (ref 26.0–34.0)
MCHC: 30.3 g/dL (ref 30.0–36.0)
MCV: 84.5 fL (ref 80.0–100.0)
Platelets: 120 10*3/uL — ABNORMAL LOW (ref 150–400)
RBC: 3.48 MIL/uL — ABNORMAL LOW (ref 3.87–5.11)
RDW: 15.7 % — ABNORMAL HIGH (ref 11.5–15.5)
WBC: 2.8 10*3/uL — ABNORMAL LOW (ref 4.0–10.5)
nRBC: 0 % (ref 0.0–0.2)

## 2022-12-19 LAB — COMPREHENSIVE METABOLIC PANEL
ALT: 23 U/L (ref 0–44)
AST: 36 U/L (ref 15–41)
Albumin: 2.6 g/dL — ABNORMAL LOW (ref 3.5–5.0)
Alkaline Phosphatase: 112 U/L (ref 38–126)
Anion gap: 9 (ref 5–15)
BUN: 14 mg/dL (ref 6–20)
CO2: 25 mmol/L (ref 22–32)
Calcium: 8.1 mg/dL — ABNORMAL LOW (ref 8.9–10.3)
Chloride: 102 mmol/L (ref 98–111)
Creatinine, Ser: 0.82 mg/dL (ref 0.44–1.00)
GFR, Estimated: >60 mL/min (ref >60)
Glucose, Bld: 125 mg/dL — ABNORMAL HIGH (ref 70–99)
Potassium: 4 mmol/L (ref 3.5–5.1)
Sodium: 136 mmol/L (ref 135–145)
Total Bilirubin: 0.4 mg/dL (ref 0.3–1.2)
Total Protein: 6.9 g/dL (ref 6.5–8.1)

## 2022-12-19 LAB — URINE CULTURE: Culture: NO GROWTH

## 2022-12-19 LAB — GLUCOSE, CAPILLARY
Glucose-Capillary: 116 mg/dL — ABNORMAL HIGH (ref 70–99)
Glucose-Capillary: 194 mg/dL — ABNORMAL HIGH (ref 70–99)

## 2022-12-19 LAB — HIV ANTIBODY (ROUTINE TESTING W REFLEX): HIV Screen 4th Generation wRfx: NONREACTIVE

## 2022-12-19 LAB — MAGNESIUM: Magnesium: 1.7 mg/dL (ref 1.7–2.4)

## 2022-12-19 MED ORDER — NIRMATRELVIR/RITONAVIR (PAXLOVID) TABLET (RENAL DOSING): 2.0000 | ORAL_TABLET | Freq: Two times a day (BID) | ORAL | 0 refills | Status: AC

## 2022-12-19 NOTE — Discharge Summary (Signed)
Physician Discharge Summary  [name] CBJ:[id number] DOB: 1965/09/03 DOA: 12/18/2022  PCP: Elfredia Nevins, MD  Admit date: 12/18/2022  Discharge date: 12/19/2022  Admitted From:Home  Disposition:  Home  Recommendations for Outpatient Follow-up:  Follow up with PCP in 1-2 weeks Remain on Paxlovid to finish course of treatment for COVID-pneumonia Continue other home medications as prior  Home Health: None  Equipment/Devices: None  Discharge Condition:Stable  CODE STATUS: Full  Diet recommendation: Heart Healthy/carb modified  Brief/Interim Summary: [name] is a 57 y.o. female with medical history significant for type 2 diabetes, hypertension, multiple sclerosis, asthma, chronic pain, and sleep apnea who presented to the ED with complaints of fevers, rigors, and sweats.  She also had some nausea and vomiting earlier in the emergency department and is developing some mild shortness of breath.  She was admitted for symptomatic COVID infection started on Paxlovid.  She was also noted to have mild AKI which has now resolved with the use of IV fluid and is now in stable condition for discharge.  Her creatinine levels have returned to baseline and she is starting to improve symptomatically.  Discharge Diagnoses:  Principal Problem:   COVID-19 Active Problems:   Asthma   Esophageal reflux   HTN (hypertension)   Mixed hyperlipidemia   Multiple sclerosis (HCC)   DM type 2 causing vascular disease (HCC)   Depression   AKI (acute kidney injury) (HCC)   Chronic pain syndrome   Hypercholesterolemia  Principal discharge diagnosis: Symptomatic COVID infection with prerenal AKI.  Discharge Instructions  Discharge Instructions     Diet - low sodium heart healthy   Complete by: As directed    Increase activity slowly   Complete by: As directed       Allergies as of 12/19/2022       Reactions   Peanut-containing Drug Products Anaphylaxis, Hives   Patient states she is  allergic to all nuts   Shellfish Allergy Anaphylaxis   Gluten Meal    Latex Other (See Comments)   Reaction: blistering   Prednisone Hives, Other (See Comments)   Reaction: causes psychological issues    Nexium [esomeprazole Magnesium] Hives   Tape Rash   Paper tape is ok to use        Medication List     TAKE these medications    Accu-Chek Guide Me w/Device Kit USE TO CHECK BLOOD SUGAR DAILY AS DIRECTED   Accu-Chek Guide test strip Generic drug: glucose blood USE TO CHECK BLOOD SUGAR 4 TIMES DAILY AS DIRECTED   Accu-Chek Softclix Lancets lancets USE TO CHECK BLOOD SUGAR 4 TIMES DAILY AS DIRECTED   acetaminophen 500 MG tablet Commonly known as: TYLENOL Take 1,000 mg by mouth every 6 (six) hours as needed for moderate pain.   albuterol 108 (90 Base) MCG/ACT inhaler Commonly known as: VENTOLIN HFA Inhale 1-2 puffs into the lungs every 6 (six) hours as needed for wheezing or shortness of breath.   albuterol 0.63 MG/3ML nebulizer solution Commonly known as: ACCUNEB Take 1 ampule by nebulization every 6 (six) hours as needed for wheezing or shortness of breath.   B-D ULTRAFINE III SHORT PEN 31G X 8 MM Misc Generic drug: Insulin Pen Needle USE TO INJECT INSULIN 2 TIMES DAILY   baclofen 20 MG tablet Commonly known as: LIORESAL Take 20 mg by mouth 3 (three) times daily.   blood glucose meter kit and supplies Kit Dispense based on patient and insurance preference. Use up to four times daily  as directed. (FOR ICD-9 250.00, 250.01).   citalopram 40 MG tablet Commonly known as: CELEXA Take 40 mg by mouth every evening.   clopidogrel 75 MG tablet Commonly known as: PLAVIX Take 75 mg by mouth daily.   Dexcom G7 Receiver Devi Use to check glucose as directed   Dexcom G7 Sensor Misc Change sensor every 10 days   gabapentin 800 MG tablet Commonly known as: NEURONTIN Take 800 mg by mouth 4 (four) times daily.   lisinopril 5 MG tablet Commonly known as:  ZESTRIL Take 1 tablet (5 mg total) by mouth daily.   metFORMIN 1000 MG tablet Commonly known as: GLUCOPHAGE Take 1 tablet (1,000 mg total) by mouth 2 (two) times daily with a meal.   metoprolol succinate 50 MG 24 hr tablet Commonly known as: TOPROL-XL Take 1 tablet (50 mg total) by mouth daily. Take with or immediately following a meal.   nirmatrelvir/ritonavir (renal dosing) 10 x 150 MG & 10 x 100MG  Tabs Commonly known as: PAXLOVID Take 2 tablets by mouth 2 (two) times daily for 5 days. Take nirmatrelvir (150 mg) one tablet twice daily for 5 days and ritonavir (100 mg) one tablet twice daily for 5 days.   nitrofurantoin (macrocrystal-monohydrate) 100 MG capsule Commonly known as: MACROBID Take 100 mg by mouth daily.   NovoLIN 70/30 Kwikpen (70-30) 100 UNIT/ML KwikPen Generic drug: insulin isophane & regular human KwikPen Inject 10 Units into the skin 2 (two) times daily before a meal. What changed: how much to take   ondansetron 4 MG tablet Commonly known as: ZOFRAN Take 1 tablet (4 mg total) by mouth 4 (four) times daily as needed for nausea or vomiting.   oxycodone 30 MG immediate release tablet Commonly known as: ROXICODONE Take 15 mg by mouth See admin instructions. Take 15 mg (1/2 tablet) by mouth in the morning and mid afternoon. Take an additional 15 mg (1/2 tablet) as needed.   pantoprazole 40 MG tablet Commonly known as: Protonix Take 1 tablet (40 mg total) by mouth 2 (two) times daily.   pravastatin 40 MG tablet Commonly known as: PRAVACHOL Take 1 tablet (40 mg total) by mouth every evening.   rOPINIRole 0.5 MG tablet Commonly known as: REQUIP Take 0.5 mg by mouth at bedtime.        Follow-up Information     Elfredia Nevins, MD. Schedule an appointment as soon as possible for a visit in 1 week(s).   Specialty: Internal Medicine Contact information: [address] [place] [address] [phone]                Allergies  Allergen  Reactions   Peanut-Containing Drug Products Anaphylaxis and Hives    Patient states she is allergic to all nuts   Shellfish Allergy Anaphylaxis   Gluten Meal    Latex Other (See Comments)    Reaction: blistering   Prednisone Hives and Other (See Comments)    Reaction: causes psychological issues    Nexium [Esomeprazole Magnesium] Hives   Tape Rash    Paper tape is ok to use    Consultations: None   Procedures/Studies: DG Chest Port 1 View  Result Date: 12/18/2022 CLINICAL DATA:  Questionable sepsis EXAM: PORTABLE CHEST 1 VIEW COMPARISON:  Chest radiograph 07/15/2022 FINDINGS: The heart is borderline enlarged, unchanged. The upper mediastinal contours are normal. There is no vascular congestion without evidence of overt pulmonary edema. There is no focal consolidation. There is no pleural effusion or pneumothorax There is no acute osseous  abnormality. Spinal stimulator leads are again noted. A linear metallic density object projecting over the left mediastinum is of uncertain etiology. IMPRESSION: 1. Borderline cardiomegaly with vascular congestion but no definite overt pulmonary edema. No focal airspace consolidation. 2. Linear metallic density object projecting over the left mediastinum is of uncertain origin, possibly external to the patient. Electronically Signed   By: Lesia Hausen M.D.   On: 12/18/2022 08:21   US Abdomen Limited RUQ (LIVER/GB)  Result Date: 11/24/2022 CLINICAL DATA:  Cirrhosis EXAM: ULTRASOUND ABDOMEN LIMITED RIGHT UPPER QUADRANT COMPARISON:  CT abdomen pelvis 11/28/2021 FINDINGS: Gallbladder: Surgically absent Common bile duct: Diameter: 9 mm Liver: Coarsened echogenicity and nodular contour. No focal lesion. Portal vein is patent on color Doppler imaging with normal direction of blood flow towards the liver. Other: None. IMPRESSION: Cirrhotic morphology of the liver. No focal lesion. Electronically Signed   By: Annia Belt M.D.   On: 11/24/2022 10:42     Discharge  Exam: Vitals:   12/19/22 0015 12/19/22 0829  BP: (!) 143/67 (!) 154/73  Pulse: 81 71  Resp: 20   Temp: 98.8 F (37.1 C)   SpO2: 91%    Vitals:   12/18/22 2115 12/18/22 2249 12/19/22 0015 12/19/22 0829  BP: (!) 133/55  (!) 143/67 (!) 154/73  Pulse: 74  81 71  Resp: 19  20   Temp: (!) 97.5 F (36.4 C)  98.8 F (37.1 C)   TempSrc: Oral  Oral   SpO2: 97% 98% 91%   Weight:      Height:        General: Pt is alert, awake, not in acute distress Cardiovascular: RRR, S1/S2 +, no rubs, no gallops Respiratory: CTA bilaterally, no wheezing, no rhonchi Abdominal: Soft, NT, ND, bowel sounds + Extremities: no edema, no cyanosis    The results of significant diagnostics from this hospitalization (including imaging, microbiology, ancillary and laboratory) are listed below for reference.     Microbiology: Recent Results (from the past 240 hour(s))  Blood Culture (routine x 2)     Status: None (Preliminary result)   Collection Time: 12/18/22  7:54 AM   Specimen: Right Antecubital; Blood  Result Value Ref Range Status   Specimen Description   Final    RIGHT ANTECUBITAL BOTTLES DRAWN AEROBIC AND ANAEROBIC   Special Requests Blood Culture adequate volume  Final   Culture   Final    NO GROWTH < 24 HOURS Performed at [facility], [address], [place], [place] [address]    Report Status PENDING  Incomplete  Resp panel by RT-PCR (RSV, Flu A&B, Covid) Anterior Nasal Swab     Status: Abnormal   Collection Time: 12/18/22  9:25 AM   Specimen: Anterior Nasal Swab  Result Value Ref Range Status   SARS Coronavirus 2 by RT PCR POSITIVE (A) NEGATIVE Final    Comment: (NOTE) SARS-CoV-2 target nucleic acids are DETECTED.  The SARS-CoV-2 RNA is generally detectable in upper respiratory specimens during the acute phase of infection. Positive results are indicative of the presence of the identified virus, but do not rule out bacterial infection or co-infection with other pathogens  not detected by the test. Clinical correlation with patient history and other diagnostic information is necessary to determine patient infection status. The expected result is Negative.  Fact Sheet for Patients: BloggerCourse.com  Fact Sheet for Healthcare Providers: SeriousBroker.it  This test is not yet approved or cleared by the [place] FDA and  has been authorized for detection and/or  diagnosis of SARS-CoV-2 by FDA under an Emergency Use Authorization (EUA).  This EUA will remain in effect (meaning this test can be used) for the duration of  the COVID-19 declaration under Section 564(b)(1) of the A ct, 21 U.S.C. section 360bbb-3(b)(1), unless the authorization is terminated or revoked sooner.     Influenza A by PCR NEGATIVE NEGATIVE Final   Influenza B by PCR NEGATIVE NEGATIVE Final    Comment: (NOTE) The Xpert Xpress SARS-CoV-2/FLU/RSV plus assay is intended as an aid in the diagnosis of influenza from Nasopharyngeal swab specimens and should not be used as a sole basis for treatment. Nasal washings and aspirates are unacceptable for Xpert Xpress SARS-CoV-2/FLU/RSV testing.  Fact Sheet for Patients: BloggerCourse.com  Fact Sheet for Healthcare Providers: SeriousBroker.it  This test is not yet approved or cleared by the [place] FDA and has been authorized for detection and/or diagnosis of SARS-CoV-2 by FDA under an Emergency Use Authorization (EUA). This EUA will remain in effect (meaning this test can be used) for the duration of the COVID-19 declaration under Section 564(b)(1) of the Act, 21 U.S.C. section 360bbb-3(b)(1), unless the authorization is terminated or revoked.     Resp Syncytial Virus by PCR NEGATIVE NEGATIVE Final    Comment: (NOTE) Fact Sheet for Patients: BloggerCourse.com  Fact Sheet for Healthcare  Providers: SeriousBroker.it  This test is not yet approved or cleared by the [place] FDA and has been authorized for detection and/or diagnosis of SARS-CoV-2 by FDA under an Emergency Use Authorization (EUA). This EUA will remain in effect (meaning this test can be used) for the duration of the COVID-19 declaration under Section 564(b)(1) of the Act, 21 U.S.C. section 360bbb-3(b)(1), unless the authorization is terminated or revoked.  Performed at [facility], [address] [place], [place] [address]      Labs: BNP (last 3 results) No results for input(s): "BNP" in the last 8760 hours. Basic Metabolic Panel: Recent Labs  Lab 12/18/22 0754 12/19/22 0514  NA 136 136  K 4.2 4.0  CL 99 102  CO2 25 25  GLUCOSE 272* 125*  BUN 22* 14  CREATININE 1.36* 0.82  CALCIUM 8.6* 8.1*  MG  --  1.7   Liver Function Tests: Recent Labs  Lab 12/18/22 0754 12/19/22 0514  AST 48* 36  ALT 29 23  ALKPHOS 140* 112  BILITOT 0.4 0.4  PROT 8.0 6.9  ALBUMIN 3.2* 2.6*   No results for input(s): "LIPASE", "AMYLASE" in the last 168 hours. No results for input(s): "AMMONIA" in the last 168 hours. CBC: Recent Labs  Lab 12/18/22 0754 12/19/22 0514  WBC 3.9* 2.8*  NEUTROABS 2.8  --   HGB 9.9* 8.9*  HCT 32.5* 29.4*  MCV 84.6 84.5  PLT 135* 120*   Cardiac Enzymes: No results for input(s): "CKTOTAL", "CKMB", "CKMBINDEX", "TROPONINI" in the last 168 hours. BNP: Invalid input(s): "POCBNP" CBG: Recent Labs  Lab 12/18/22 2123 12/19/22 0807  GLUCAP 199* 116*   D-Dimer No results for input(s): "DDIMER" in the last 72 hours. Hgb A1c No results for input(s): "HGBA1C" in the last 72 hours. Lipid Profile No results for input(s): "CHOL", "HDL", "LDLCALC", "TRIG", "CHOLHDL", "LDLDIRECT" in the last 72 hours. Thyroid function studies No results for input(s): "TSH", "T4TOTAL", "T3FREE", "THYROIDAB" in the last 72 hours.  Invalid input(s): "FREET3" Anemia  work up No results for input(s): "VITAMINB12", "FOLATE", "FERRITIN", "TIBC", "IRON", "RETICCTPCT" in the last 72 hours. Urinalysis    Component Value Date/Time   COLORURINE YELLOW 12/18/2022  1131   APPEARANCEUR CLEAR 12/18/2022 1131   LABSPEC 1.011 12/18/2022 1131   PHURINE 5.0 12/18/2022 1131   GLUCOSEU NEGATIVE 12/18/2022 1131   HGBUR NEGATIVE 12/18/2022 1131   BILIRUBINUR NEGATIVE 12/18/2022 1131   KETONESUR NEGATIVE 12/18/2022 1131   PROTEINUR NEGATIVE 12/18/2022 1131   UROBILINOGEN 1.0 06/26/2014 1720   NITRITE NEGATIVE 12/18/2022 1131   LEUKOCYTESUR NEGATIVE 12/18/2022 1131   Sepsis Labs Recent Labs  Lab 12/18/22 0754 12/19/22 0514  WBC 3.9* 2.8*   Microbiology Recent Results (from the past 240 hour(s))  Blood Culture (routine x 2)     Status: None (Preliminary result)   Collection Time: 12/18/22  7:54 AM   Specimen: Right Antecubital; Blood  Result Value Ref Range Status   Specimen Description   Final    RIGHT ANTECUBITAL BOTTLES DRAWN AEROBIC AND ANAEROBIC   Special Requests Blood Culture adequate volume  Final   Culture   Final    NO GROWTH < 24 HOURS Performed at [facility], [address], [place], [place] [address]    Report Status PENDING  Incomplete  Resp panel by RT-PCR (RSV, Flu A&B, Covid) Anterior Nasal Swab     Status: Abnormal   Collection Time: 12/18/22  9:25 AM   Specimen: Anterior Nasal Swab  Result Value Ref Range Status   SARS Coronavirus 2 by RT PCR POSITIVE (A) NEGATIVE Final    Comment: (NOTE) SARS-CoV-2 target nucleic acids are DETECTED.  The SARS-CoV-2 RNA is generally detectable in upper respiratory specimens during the acute phase of infection. Positive results are indicative of the presence of the identified virus, but do not rule out bacterial infection or co-infection with other pathogens not detected by the test. Clinical correlation with patient history and other diagnostic information is necessary to determine  patient infection status. The expected result is Negative.  Fact Sheet for Patients: BloggerCourse.com  Fact Sheet for Healthcare Providers: SeriousBroker.it  This test is not yet approved or cleared by the [place] FDA and  has been authorized for detection and/or diagnosis of SARS-CoV-2 by FDA under an Emergency Use Authorization (EUA).  This EUA will remain in effect (meaning this test can be used) for the duration of  the COVID-19 declaration under Section 564(b)(1) of the A ct, 21 U.S.C. section 360bbb-3(b)(1), unless the authorization is terminated or revoked sooner.     Influenza A by PCR NEGATIVE NEGATIVE Final   Influenza B by PCR NEGATIVE NEGATIVE Final    Comment: (NOTE) The Xpert Xpress SARS-CoV-2/FLU/RSV plus assay is intended as an aid in the diagnosis of influenza from Nasopharyngeal swab specimens and should not be used as a sole basis for treatment. Nasal washings and aspirates are unacceptable for Xpert Xpress SARS-CoV-2/FLU/RSV testing.  Fact Sheet for Patients: BloggerCourse.com  Fact Sheet for Healthcare Providers: SeriousBroker.it  This test is not yet approved or cleared by the [place] FDA and has been authorized for detection and/or diagnosis of SARS-CoV-2 by FDA under an Emergency Use Authorization (EUA). This EUA will remain in effect (meaning this test can be used) for the duration of the COVID-19 declaration under Section 564(b)(1) of the Act, 21 U.S.C. section 360bbb-3(b)(1), unless the authorization is terminated or revoked.     Resp Syncytial Virus by PCR NEGATIVE NEGATIVE Final    Comment: (NOTE) Fact Sheet for Patients: BloggerCourse.com  Fact Sheet for Healthcare Providers: SeriousBroker.it  This test is not yet approved or cleared by the [place] FDA and has been  authorized for detection and/or  diagnosis of SARS-CoV-2 by FDA under an Emergency Use Authorization (EUA). This EUA will remain in effect (meaning this test can be used) for the duration of the COVID-19 declaration under Section 564(b)(1) of the Act, 21 U.S.C. section 360bbb-3(b)(1), unless the authorization is terminated or revoked.  Performed at [facility], [address] [place], [place] [address]      Time coordinating discharge: 35 minutes  SIGNED:   Erick Blinks, DO [facility] 12/19/2022, 9:58 AM  If 7PM-7AM, please contact night-coverage [email or web address]

## 2022-12-23 LAB — CULTURE, BLOOD (ROUTINE X 2): Special Requests: ADEQUATE

## 2023-05-11 ENCOUNTER — Telehealth: Payer: Self-pay

## 2023-05-11 NOTE — Progress Notes (Addendum)
 Name: [name] DOB: 1965-11-14 MRN: [id number]  History of Present Illness: [name] is a 58 y.o. female who presents today as a new patient at [facility] [place]. All available relevant medical records have been reviewed.   She reports chief complaint of recurrent UTls.  Per chart review: - 12/02/2021: Admitted for severe sepsis secondary E. coli UTI.  - 04/19/2022: Urine culture negative. - 04/25/2022: Admitted for sepsis due to colitis. - 07/15/2022: Admitted for sepsis secondary to E. coli UTI.  - 12/18/2022: Urine culture negative.  Urinary Symptoms: She reports 2-3 UTl's in the last year. When present, UTI symptoms include increased urinary urgency, frequency, fever, nausea, back pain. She reports that UTI symptoms do not seem to correlate with intercourse. She denies acute UTI symptoms today.  At baseline: She reports urinary urgency and frequency - reports voiding "at least 10" times per day and "at least 8" times at night.  Denies dysuria, gross hematuria, hesitancy, straining to void, or sensations of incomplete emptying.  She reports urge incontinence. She denies stress incontinence. She leaks multiple times per day; not current using any pads or diapers. She states the urinary incontinence is "sometimes" significantly bothersome.  In terms of treatment, She has tried nothing.  She reports caffeine intake (3 cups of tea daily). Reports history of OSA; was previously using CPAP routinely but is currently seeking a new neurologist / sleep specialist to get her restarted with that.  She denies history of kidney stones.  Vaginal / prolapse Symptoms: She reports vaginal bulge sensation which is described as "about the size of a baseball".  She denies seeing a vaginal bulge. This bulge is not bothersome and was first noticed about 1 month ago.  Denies seeing GYN provider in several years.  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   She denies vaginal pain,  bleeding, or discharge.  She denies use of topical vaginal estrogen cream.  Bowel Symptoms: Reports chronic constipation, straining to defecate, and occasional pain with defecation. Denies diarrhea or fecal incontinence. She reports she is followed by GI provider.  Past OB/GYN History: OB History     Gravida  5   Para  5   Term  5   Preterm      AB      Living         SAB      IAB      Ectopic      Multiple      Live Births             She reports being sexually active.  She denies dyspareunia. Reports vaginal dryness.  She is post menopausal.   Fall Screening: Do you usually have a device to assist in your mobility? No   Medications: Current Outpatient Medications  Medication Sig Dispense Refill   ACCU-CHEK GUIDE test strip USE TO CHECK BLOOD SUGAR 4 TIMES DAILY AS DIRECTED 100 strip 0   Accu-Chek Softclix Lancets lancets USE TO CHECK BLOOD SUGAR 4 TIMES DAILY AS DIRECTED 100 each 0   acetaminophen (TYLENOL) 500 MG tablet Take 1,000 mg by mouth every 6 (six) hours as needed for moderate pain.     albuterol (ACCUNEB) 0.63 MG/3ML nebulizer solution Take 1 ampule by nebulization every 6 (six) hours as needed for wheezing or shortness of breath.     albuterol (VENTOLIN HFA) 108 (90 Base) MCG/ACT inhaler Inhale 1-2 puffs into the lungs every 6 (six) hours as needed for wheezing or shortness  of breath.      baclofen (LIORESAL) 20 MG tablet Take 20 mg by mouth 3 (three) times daily.   3   blood glucose meter kit and supplies KIT Dispense based on patient and insurance preference. Use up to four times daily as directed. (FOR ICD-9 250.00, 250.01). 1 each 0   Blood Glucose Monitoring Suppl (ACCU-CHEK GUIDE ME) w/Device KIT USE TO CHECK BLOOD SUGAR DAILY AS DIRECTED 1 kit 0   citalopram (CELEXA) 40 MG tablet Take 40 mg by mouth every evening.      clopidogrel (PLAVIX) 75 MG tablet Take 75 mg by mouth daily.     Continuous Blood Gluc Receiver (DEXCOM G7 RECEIVER) DEVI  Use to check glucose as directed 1 each 0   Continuous Blood Gluc Sensor (DEXCOM G7 SENSOR) MISC Change sensor every 10 days 3 each 2   estradiol (ESTRACE) 0.1 MG/GM vaginal cream Discard plastic applicator. Insert a blueberry size amount (approximately 1 gram) of cream on fingertip inside vagina at bedtime every night for 1 week then every other night. For long term use. 30 g 3   gabapentin (NEURONTIN) 800 MG tablet Take 800 mg by mouth 4 (four) times daily.     insulin isophane & regular human KwikPen (NOVOLIN 70/30 KWIKPEN) (70-30) 100 UNIT/ML KwikPen Inject 10 Units into the skin 2 (two) times daily before a meal. (Patient taking differently: Inject 60 Units into the skin 2 (two) times daily before a meal.) 45 mL 2   Insulin Pen Needle (B-D ULTRAFINE III SHORT PEN) 31G X 8 MM MISC USE TO INJECT INSULIN 2 TIMES DAILY 100 each 2   lisinopril (ZESTRIL) 5 MG tablet Take 1 tablet (5 mg total) by mouth daily. 30 tablet 4   metFORMIN (GLUCOPHAGE) 1000 MG tablet Take 1 tablet (1,000 mg total) by mouth 2 (two) times daily with a meal. 60 tablet 4   nitrofurantoin, macrocrystal-monohydrate, (MACROBID) 100 MG capsule Take 100 mg by mouth daily.     ondansetron (ZOFRAN) 4 MG tablet Take 1 tablet (4 mg total) by mouth 4 (four) times daily as needed for nausea or vomiting. 20 tablet 0   oxycodone (ROXICODONE) 30 MG immediate release tablet Take 15 mg by mouth See admin instructions. Take 15 mg (1/2 tablet) by mouth in the morning and mid afternoon. Take an additional 15 mg (1/2 tablet) as needed.     pantoprazole (PROTONIX) 40 MG tablet Take 1 tablet (40 mg total) by mouth 2 (two) times daily. 180 tablet 3   pravastatin (PRAVACHOL) 40 MG tablet Take 1 tablet (40 mg total) by mouth every evening. 30 tablet 1   rOPINIRole (REQUIP) 0.5 MG tablet Take 0.5 mg by mouth at bedtime.     Vibegron (GEMTESA) 75 MG TABS Take 1 tablet (75 mg total) by mouth daily. 30 tablet 11   metoprolol succinate (TOPROL-XL) 50 MG 24 hr  tablet Take 1 tablet (50 mg total) by mouth daily. Take with or immediately following a meal. 90 tablet 3   No current facility-administered medications for this visit.    Allergies: Allergies  Allergen Reactions   Peanut-Containing Drug Products Anaphylaxis and Hives    Patient states she is allergic to all nuts   Shellfish Allergy Anaphylaxis   Gluten Meal    Latex Other (See Comments)    Reaction: blistering   Prednisone Hives and Other (See Comments)    Reaction: causes psychological issues    Nexium [Esomeprazole Magnesium] Hives   Tape Rash  Paper tape is ok to use    Past Medical History:  Diagnosis Date   Abnormal EKG 04/16/2012   Left Axis deviation 04/16/2012   Asthma    Chest pain 04/16/2012   Chronic abdominal pain    Chronic neck pain    C4-6 fusion   Chronic pain syndrome 01/11/2012   Class 2 severe obesity due to excess calories with serious comorbidity and body mass index (BMI) of 35.0 to 35.9 in adult (HCC) 03/15/2017   CONSTIPATION 05/16/2009   Qualifier: Diagnosis of  By: Yetta Barre FNP-BC, Kandice L    Diabetes mellitus without complication (HCC)    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 Norma [facility]ricks[facility] at [facility]   Non-adherence to medical treatment 05/14/2009   Qualifier: Diagnosis of   By: Ricard Dillon       RECTAL BLEEDING 07/05/2009   Qualifier: Diagnosis of   By: Minna Merritts       Sleep apnea    Spondylosis of thoracic region without myelopathy or radiculopathy  07/02/2014   Uncontrolled type 2 diabetes mellitus with hyperglycemia (HCC) 03/15/2017   Past Surgical History:  Procedure Laterality Date   ABDOMINAL HYSTERECTOMY     APPENDECTOMY     BACK SURGERY  03/07/2012   Nerve Stimulator   BIOPSY  04/23/2022   Procedure: BIOPSY;  Surgeon: Dolores Frame, MD;  [facility]: [facility];  Service: Gastroenterology;;   CHOLECYSTECTOMY     COLONOSCOPY  07/09/2009   RMR; normal rectum aside from anal canal hemorrhoids/scattered left-sided diverticula   COLONOSCOPY WITH PROPOFOL N/A 04/23/2022   Procedure: COLONOSCOPY WITH PROPOFOL;  Surgeon: Dolores Frame, MD;  Location: [facility];  Service: Gastroenterology;  Laterality: N/A;   ESOPHAGOGASTRODUODENOSCOPY  05/22/2009   RMR; normal /small HH   ESOPHAGOGASTRODUODENOSCOPY  2007   RMR: non-critical Schatzki's rin, non-maniuplated   ESOPHAGOGASTRODUODENOSCOPY N/A 10/24/2013   Procedure: ESOPHAGOGASTRODUODENOSCOPY (EGD);  Surgeon: Corbin Ade, MD;  Location: [facility];  Service: Endoscopy;  Laterality: N/A;  9:45   ESOPHAGOGASTRODUODENOSCOPY N/A 04/23/2022   Procedure: ESOPHAGOGASTRODUODENOSCOPY (EGD);  Surgeon: Marguerita Merles, Reuel Boom, MD;  Location: [facility];  Service: Gastroenterology;  Laterality: N/A;   ESOPHAGOGASTRODUODENOSCOPY (EGD) WITH ESOPHAGEAL DILATION  03/17/2012   ZHY:QMVHQION appearing esophageal mucosa of uncertain significance. Status post [name] dilation followed by esophageal bx   ESOPHAGOGASTRODUODENOSCOPY (EGD) WITH PROPOFOL N/A 07/24/2022   Procedure: ESOPHAGOGASTRODUODENOSCOPY (EGD) WITH PROPOFOL;  Surgeon: Corbin Ade, MD;  [facility];  Service: Endoscopy;  Laterality: N/A;  7:30 am   MALONEY DILATION N/A 10/24/2013   Procedure: [name] DILATION;  Surgeon: Corbin Ade, MD;  [facility];  Service: Endoscopy;  Laterality: N/A;   MANDIBLE SURGERY     NECK SURGERY     X2, last time 2013   Family History  Problem  Relation Age of Onset   Cancer Mother        unknown type  Diabetes Father    Diabetes Sister    Colon cancer Neg Hx    Social History   Socioeconomic History   Marital status: Married    Spouse name: Not on file   Number of children: Not on file   Years of education: Not on file   Highest education level: Not on file  Occupational History   Not on file  Tobacco Use   Smoking status: Never   Smokeless tobacco: Never  Vaping Use   Vaping status: Never Used  Substance and Sexual Activity   Alcohol use: Yes    Comment: only three times a year.   Drug use: No   Sexual activity: Yes    Birth control/protection: Surgical  Other Topics Concern   Not on file  Social History Narrative   5 kids, raising grandchild   Social Drivers of Corporate investment banker Strain: Not on file  Food Insecurity: Patient Declined (12/18/2022)   Hunger Vital Sign    Worried About Running Out of Food in the Last Year: Patient declined    Ran Out of Food in the Last Year: Patient declined  Transportation Needs: Patient Declined (12/18/2022)   PRAPARE - Administrator, Civil Service (Medical): Patient declined    Lack of Transportation (Non-Medical): Patient declined  Physical Activity: Not on file  Stress: Not on file  Social Connections: Not on file  Intimate Partner Violence: Patient Declined (12/18/2022)   Humiliation, Afraid, Rape, and Kick questionnaire    Fear of Current or Ex-Partner: Patient declined    Emotionally Abused: Patient declined    Physically Abused: Patient declined    Sexually Abused: Patient declined    SUBJECTIVE  Review of Systems Constitutional: Patient denies any unintentional weight loss or change in strength lntegumentary: Patient denies any rashes or pruritus Cardiovascular: Patient denies chest pain or syncope Respiratory: Patient denies shortness of breath Gastrointestinal: As per HPI  Musculoskeletal: Patient denies muscle cramps or  weakness Neurologic: Patient denies convulsions or seizures Allergic/Immunologic: Patient denies recent allergic reaction(s) Hematologic/Lymphatic: Patient denies bleeding tendencies Endocrine: Patient denies heat/cold intolerance  GU: As per HPI.  OBJECTIVE Vitals:   05/12/23 1058  BP: 134/65  Pulse: 97   There is no height or weight on file to calculate BMI.  Physical Examination Constitutional: No obvious distress; patient is non-toxic appearing  Cardiovascular: No visible lower extremity edema.  Respiratory: The patient does not have audible wheezing/stridor; respirations do not appear labored  Gastrointestinal: Abdomen non-distended Musculoskeletal: Normal ROM of UEs  Skin: No obvious rashes/open sores  Neurologic: CN 2-12 grossly intact Psychiatric: Answered questions appropriately with normal affect  Hematologic/Lymphatic/Immunologic: No obvious bruises or sites of spontaneous bleeding  UA: Patient did not void in office today - reported urinating prior to arrival PVR: 24 ml  ASSESSMENT Recurrent UTI - Plan: Urinalysis, Routine w reflex microscopic, BLADDER SCAN AMB NON-IMAGING, estradiol (ESTRACE) 0.1 MG/GM vaginal cream  History of sepsis  OAB (overactive bladder) - Plan: Vibegron (GEMTESA) 75 MG TABS  Urinary urgency - Plan: Vibegron (GEMTESA) 75 MG TABS  Urinary frequency - Plan: Vibegron (GEMTESA) 75 MG TABS  Urge incontinence - Plan: Vibegron (GEMTESA) 75 MG TABS  Vaginal dryness - Plan: estradiol (ESTRACE) 0.1 MG/GM vaginal cream  Multiple sclerosis (HCC)  Chronic constipation  Atrophic vaginitis  Recurrent UTls with history of urosepsis x2:  We discussed the possible etiologies of recurrent UTls including ascending infection related to intercourse; vaginal atrophy; transmural infection that has been treated  incompletely; urinary tract stones; incomplete bladder emptying with urinary stasis; kidney or bladder tumor; urethral diverticulum; and  colonization of  vagina and urinary tract with pathologic, adherent organisms.   For UTI prevention we discussed the following options, for which detailed information has been included in Patient Information section of today's After Visit Summary. > Maintain adequate fluid intake daily to flush out the urinary tract. > Go to the bathroom to urinate every 4-6 hours while awake to minimize urinary stasis / bacterial overgrowth in the bladder. > Proanthocyanidin (PAC) supplement 36 mg daily; must be soluble (insoluble form of PAC will be ineffective). Recommend Ellura.  > D-mannose 2 g daily to minimize bacterial adherence in urinary tract > Vitamin C supplement to acidify urine to suppress bacterial growth > Probiotic to maintain healthy vaginal microbiome > Topical vaginal estrogen for vaginal atrophy.  We discussed the following: - The etiology and consequences of urogenital epithelial atrophy was explained to patient. The thinning of the epithelium of the urethra can contribute to urinary urgency and frequency syndromes. In addition, the normal bacterial flora that colonizes the perineum may contribute to UTI risk because the thin urethral epithelium allows the bacteria to become adherent and the change in vaginal pH can disrupt the vaginal / urethral microbiome and allow for bacterial overgrowth. - The normal bacterial flora that colonizes the perineum may contribute to UTI risk because the thin urethral epithelium allows the bacteria to become adherent and the change in vaginal pH can disrupt the vaginal / urethral microbiome and allow for bacterial overgrowth.  - Topical vaginal estrogen replacement will take about 3 months to restore the vaginal pH. - Topical vaginal replacement may sting/burn initially due to severe dryness, which will improve with ongoing treatment as the tissue gets healthier.  - OK to have sex with any of the topical vaginal estrogen replacement options.  - There have been  studies that evaluate use of low-dose intravaginal estrogen cream that shows minimal systemic absorption that is negligible after 3 weeks. There have been no studies indicating that use of topical vaginal estrogen increases a patient's risk of contributing to breast cancer development or recurrence.  If recurrent UTls persist, may consider UTI prophylaxis with a daily low dose antibiotic in the future.  Patient ultimately decided to start with topical vaginal estrogen cream.   2. Vaginal dryness / atrophy. As above.  3. OAB with urinary frequency, nocturia, urgency, and urge incontinence.  We discussed the symptoms of overactive bladder (OAB), which include urinary urgency, frequency, nocturia, with or without urge incontinence.   While we may not know the exact etiology of OAB, several risk factors can be identified.  - We discussed this patient's neurogenic risk factors for OAB-type symptoms including T2DM and MS.  - Likely exacerbated by caffeine intake.   We discussed the following management options in detail including potential benefits, risks, and side effects: Behavioral therapy: Modify fluid intake Decreasing bladder irritants (such as caffeine) Urge suppression strategies Bladder retraining / timed voiding Double voiding Medication(s): - Not a safe candidate for anticholinergic medications due to risk for side effects based on patient's age, comorbidities (including multiple sclerosis), and pre-existing constipation.  - For beta-3 agonist medication, we discussed the risk for urinary retention and the potential side effect of elevated blood pressure specific to Myrbetriq (which is more likely to occur in individuals with uncontrolled hypertension).   She decided to proceed with trial of Gemtesa 75 mg daily and to work on behavioral modifications including  minimizing caffeine intake and working on timed voiding / bladder retraining.  Will plan for follow up in 8 weeks or sooner  if needed. Pt verbalized understanding and agreement. All questions were answered.  PLAN Advised the following: 1. Start topical vaginal estrogen cream as prescribed. 2. Gemtesa (Vibegron) 75 mg daily. 3. Minimize caffeine intake. 4. Work on timed voiding / bladder retraining. 5. Return in about 8 weeks (around 07/07/2023) for UA, PVR, & f/u with Evette Georges NP.  Orders Placed This Encounter  Procedures   Urinalysis, Routine w reflex microscopic   BLADDER SCAN AMB NON-IMAGING   It has been explained that the patient is to follow regularly with their PCP in addition to all other providers involved in their care and to follow instructions provided by these respective offices. Patient advised to contact urology clinic if any urologic-pertaining questions, concerns, new symptoms or problems arise in the interim period.  Patient Instructions  UTI prevention / management:  Difference between Urinalysis (urine dipstick test) and Urine culture:  Urinalysis (urine dipstick test): A quick office test used as an indicator to determine whether or not further testing is necessary (such as a urine culture, urine microscopy, etc.) The urinalysis cannot differentiate a true bacterial UTI or give a definitive diagnosis for the findings.  Urine culture: May be performed based on the findings of a urinalysis to evaluate for UTI. Grows out on a petri dish for 48-72 hours. Provides important information about: whether or not bacterial growth is present and if so: what the predominant bacteria is which antibiotics will work best against that bacteria That information is important so that we can diagnose and treat patients appropriately as there are other conditions which may mimic UTls which must not be missed (such as cancer, interstitial cystitis, stones, etc.). Assists [place] with antibiotic stewardship to minimize patient's risk for developing antibiotic resistance (getting to a point where no  antibiotics work anymore).  Options when UTI symptoms occur: 1. Call [facility] [place] and request to speak with triage nurse (phone # [phone], select option 3). In accordance with clinic guidelines the nurse will determine next steps based on patient-reported symptoms, which may include: same-day lab visit to provide urine specimen, recommendation to schedule Urology office visit appointment for further evaluation, recommendation to proceed to ER, etc. 2. Call your Primary Care Provider (PCP) office to request urgent / same-day visit. Be sure to request for urine culture to be ordered and have results faxed to Urology (fax # [phone]).  3. Go to urgent care. Be sure to request for urine culture to be ordered and have results faxed to Urology (fax # [phone]).   For bladder pain/ burning with urination: - Can take over-the-counter Pyridium (phenazopyridine; commonly known under the "AZO" brand) for a few days as needed. Limit use to no more than 3 days consecutively due to risk for methemoglobinemia, liver function issues, and bone health damage with long term use of Pyridium. - Alternative: Prescription urinary analgesics (such as Uribel, Urogesic blue, Urelle, Uro-MP). Often expensive / poorly covered by insurance unfortunately.  Options / recommendations for UTI prevention: - Topical vaginal estrogen for vaginal atrophy (aka Genitourinary Syndrome of Menopause (GSM)). - Adequate daily fluid intake to flush out the urinary tract. - Go to the bathroom to urinate every 4-6 hours while awake to minimize urinary stasis / bacterial overgrowth in the bladder. - Proanthocyanidin (PAC) supplement 36 mg daily; must be soluble (insoluble form of PAC will be ineffective). Recommended brand:  Ellura. This is an over-the-counter supplement (often must be found/ purchased online) supplement derived from cranberries with concentrated active component: Proanthocyanidin (PAC) 36 mg daily.  Decreases bacterial adherence to bladder lining.  - D-mannose powder (2 grams daily). This is an over-the-counter supplement which decreases bacterial adherence to bladder lining (it is a sugar that inhibits bacterial adherence to urothelial cells by binding to the pili of enteric bacteria). Take as per manufacturer recommendation. Can be used as an alternative or in addition to the concentrated cranberry supplement.  - Vitamin C supplement to acidify urine to minimize bacterial growth.  - Probiotic to maintain healthy vaginal microbiome to suppress bacteria at urethral opening. Brand recommendations: Darrold Junker (includes probiotic & D-mannose ), Feminine Balance (highest concentration of lactobacillus) or Hyperbiotic Pro 15.  Note for patients with diabetes:  - Be aware that D-mannose contains sugar.  Note for patients with interstitial cystitis (IC):  - Patients with IC should typically avoid cranberry/ PAC supplements and Vitamin C supplements due to their acidity, which may exacerbate IC-related bladder pain. - Symptoms of true bacterial UTI can overlap / mimic symptoms of an IC flare up. Antibiotic use is NOT indicated for IC flare ups. Urine culture needed prior to antibiotic treatment for IC patients. The goal is to minimize your risk for developing antibiotic-resistant bacteria.    Vaginal atrophy I Genitourinary syndrome of menopause (GSM):  What it is: Changes in the vaginal environment (including the vulva and urethra) including: Thinning of the epithelium (skin/ mucosa surface) Can contribute to urinary urgency and frequency Can contribute to dryness, itching, irritation of the vulvar and vaginal tissue Can contribute to pain with intercourse Can contribute to physical changes of the labia, vulva, and vagina such as: Narrowing of the vaginal opening Decreased vaginal length Loss of labial architecture Labial adhesions Pale color of vulvovaginal tissue  Loss of pubic hair Allows  bacteria to become adherent  Results in increased risk for urinary tract infection (UTI) due to bacterial overgrowth and migration up the urethra into the bladder Change in vaginal pH (acid/ base balance) Allows for alteration / disruption of the normal bacterial flora / microbiome Results in increased risk for urinary tract infection (UTI) due to bacterial overgrowth  Treatment options: Over-the-counter lubricants (see list below). Prescription vaginal estrogen replacement. Options: Topical vaginal estrogen cream Estrace, Premarin, or compounded estradiol cream/ gel We advise: Discard plastic applicator as that tends to use more medication than you need, which is not harmful but wastes / uses up the medication. Also the plastic applicator may cause discomfort. Insert blueberry size amount of medication via the tip of your finger inside vagina nightly for 1 week then 2-3 times per week (long term). Estring vaginal ring Exchanged every 3 months (either at home or in office by provider) Vagifem vaginal tablet Inserted nightly for 2 weeks then twice a week (long term) lntrarosa vaginal suppository Vaginal DHEA: converts to estrogen in vaginal tissue without systemic effect Inserted nightly (long term) Vaginal laser therapy (Mona Lisa touch) Performed in 3 treatments each 6 weeks apart (available in our [place] office). Can feel like a sunburn for 3-4 days after each treatment until new skin heals in. Usually not covered by insurance. Estimated cost is $1500 for all 3 sessions.  FYI regarding prescription vaginal estrogen treatment options: All topical vaginal estrogen replacement options are equivalent in terms of efficacy. Topical vaginal estrogen replacement will take about 3 months to be effective. OK to have sex with any of the topical vaginal  estrogen replacement options. Topical vaginal estrogen replacement may sting/burn initially due to severe dryness, which will improve with  ongoing treatment. There have been studies that evaluate use of low-dose intravaginal estrogen that show minimal systemic absorption which is negligible after 3 weeks. There have been no studies indicating increased risk of contributing to cancer development or recurrence.  Topical vaginal estrogen cream safe to use with breast cancer history [email or web address]  Topical vaginal estrogen cream safe to use with blood clot history [email or web address]   Lubricants and Moisturizers for Treating Genitourinary Syndrome of Menopause and Vulvovaginal Atrophy Treatment Comments I Available Products   Lubricants   Water-based Ingredients: Deionized water, glycerin, propylene glycol; latex safe; rare irritation; dry out with extended sexual activity Astroglide, Good Clean Love, K-Y Jelly, Natural, Organic, Pink, Sliquid, Sylk, Yes    Oil Based Ingredients: avocado, olive, peanut, corn; latex safe; can be used with silicone products; staining; safe (unless peanut allergy); non-irritating Coconut oil, vegetable oil, vitamin E oil  Silicone-Based Ingredients: Silicone polymers; staining; typically nonirritating, long lasting; waterproof; should not be used with silicone dilators, sexual toys, or gynecologic products Astroglide X, Oceanus Ultra Pure, Pink Silicone, Pjur Eros, Replens Silky Smooth, Silicone Premium JO, SKYN, Uberlube, [employer] Based Minimize harm to sperm motility; designed Astroglide TTC, Conceive Plus, Pre for couples trying to conceive Seed, Yes Baby  Fertility Friendly Minimize harm to sperm motility; designed Astroglide, TTC, Conceive Plus, Pre for couples trying to conceive Seed, Yes Baby  Vaginal Moisturizers    Vaginal Moisturizers For maintenance use 1 to 3 times weekly; can benefit women with dryness, chafing with AOL, and recurrent vaginal infections irrespective of sexual activity timing Balance Active Menopause Vaginal Moisturizing Lubricant, Canesintima Intimate Moisturizer, Replens, Rephresh, Sylk Natural Intimate Moisturizer, Yes Vaginal Moisturizer  Hybrids Properties of both water and silicone-based products (combination of a vaginal lubricant and moisturizer); Non-irritating; good option for women with allergies and sensitivities Lubrigyn, Luvena  Suppositories Hyaluronic acid to retain moisture Revaree  Vulvar Soothing Creams/Oils    Medicated CreamsP ain and burn relief; Ingredients: 4% Lidocaine, Aloe Vera gel Releveum ([place])  Non-Medicated Creams For anti-itch and moisture/maintenance; Ingredients: Coconut oil, Avocado oil, Shea Butter, Olive oil, Vitamin E Vajuvenate, Vmagic  Oils !For moisture/maintenance !Coconut oil, Vitamin E oil, Emu oil              Overactive bladder (OAB) overview for patients:  Symptoms may include: urinary urgency ("gotta go" feeling) urinary frequency (voiding >8 times per day) night time urination (nocturia) urge incontinence of urine (UUI)  While we do not know the exact etiology of OAB, several treatment options exist including:  Behavioral therapy: Reducing fluid intake Decreasing bladder stimulants (such as caffeine) and irritants (such as acidic food, spicy foods, alcohol) Urge suppression strategies Bladder retraining via timed voiding  Pelvic floor physical therapy  Medication(s) - can use one or both of the drug classes below. Anticholinergic / antimuscarinic medications:  Mechanism of action: Activate M3 receptors to reduce detrusor stimulation and increase bladder capacity   (parasympathetic nervous system). Effect: Relaxes the bladder to decrease overactivity, increase bladder storage capacity, and  increase time between voids. Onset: Slow acting (may take 8-12 weeks to determine efficacy). Medications include: Vesicare (Solifenacin), Ditropan (Oxybutynin), Detrol (Tolterodine), Toviaz (Fesoterodine), Sanctura (Trospium), Urispas (Flavoxate), Enablex (Darifenacin), Bentyl (Dicyclomine), Levsin (Hyoscyamine ). Potential side effects include but are not limited to: Dry eyes, dry mouth, constipation, cognitive impairment, dementia risk with long term use, and urinary retention/ incomplete bladder emptying.  Insurance companies generally prefer for patients to try 1-2 anticholinergic / antimuscarinic medications first due to low cost. Some exceptions are made based on patient-specific comorbidities / risk factors. Beta-3 agonist medications: Mechanism of action: Stimulates selective B3 adrenergic receptors to cause smooth muscle bladder relaxation (sympathetic nervous system). Effect: Relaxes the bladder to decrease overactivity, increase bladder storage capacity, and increase time between voids. Onset: Slow acting (may take 8-12 weeks to determine efficacy). Medications include: Myrbetriq (Mirabegron) and Vibegron Leslye Peer). Potential side effects include but are not limited to: urinary retention / incomplete bladder emptying and elevated blood pressure (more likely to occur in individuals with pre-existing uncontrolled hypertension). These medications tend to be more expensive than the anticholinergic / antimuscarinic medications.   For patients with refractory OAB (if the above treatment options have been unsuccessful): Posterior tibial nerve stimulation (PTNS). Small acupuncture-type needle inserted near ankle with electric current to stimulate bladder via posterior tibial nerve pathway. Initially requires 12 weekly in-office treatments lasting 30 minutes each; followed by monthly in-office treatments lasting 30 minutes each for 1 year.  Bladder Botox injections. How it is done: Typically done  via in-office cystoscopy; sometimes done in the OR depending on the situation. The bladder is numbed with lidocaine instilled via a catheter. Then the urologist injects Botox into the bladder muscle wall in about 20 locations. Causes local paralysis of the bladder muscle at the injection sites to reduce bladder muscle overactivity / spasms. The effect lasts for approximately 6 months and cannot be reversed once performed. Risks may included but are not limited to: infection, incomplete bladder emptying/ urinary retention, short term need for self-catheterization or indwelling catheter, and need for repeat therapy. There is a 5-12% chance of needing to catheterize with Botox - that usually resolves in a few months as the Botox wears off. Typically Botox injections would need to be repeated every 3-12 months since this is not a permanent therapy.  Sacral neuromodulation trial (Medtronic lnterStim or Axonics implant). Sacral neuromodulation is FDA-approved for uncontrolled urinary urgency, urinary frequency, urinary urge incontinence, non-obstructive urinary retention, or fecal incontinence. It is not FDA-approved as a treatment for pain. The goal of this therapy is at least a 50% improvement in symptoms. It is NOT realistic to expect a 100% cure. This is a a 2-step outpatient procedure. After a successful test period, a permanent wire and generator are placed in the OR. We discussed the risk of infection. We reviewed the fact that about 30% of patients fail the test phase and are not candidates for permanent generator placement. During the 1-2 week trial phase, symptoms are documented by the patient to determine response. If patient gets at least a 50% improvement in symptoms, they may then proceed with Step 2. Step 1: Trial lead placement. Per physician discretion, may done one of two ways: Percutaneous nerve evaluation (PNE) in the [facility] urology office. Performed by urologist under local anesthesia  (numbing the area with lidocaine) using a spinal needle for placement of test wire, which usually stays in place for 5-7 days to determine therapy response. Test lead placement in OR under anesthesia. Usually stays in place 2 weeks to determine therapy response. > Step 2: Permanent implantation of sacral neuromodulation device, which is performed in the OR.  Sacral neuromodulation implants: All are conditionally MRI safe. Manufacturer: Medtronic Website: BuffaloDry[employer]er.gl therapy/right-for-you.html Options: lnterStim X: Non-rechargeable. The battery lasts 10 years on average. lnterStim Micro: Rechargeable. The battery lasts 15 years on average and must be charged routinely. Approximately 50%  smaller implant than lnterStim X implant.  Manufacturer: Axonics Website: Findrealrelief.axonics.com Options: Non-rechargeable (Axonics F15): The battery lasts 15 years on average. Rechargeable (Axonics R20): The battery lasts 20 years on average and must be charged in office for about 1 hour every 6-10 months on average. Approximately 50% smaller implant than Axonics non-rechargeable implant.  Note: Generally the rechargeable devices are only advised for very small or thin patients who may not have sufficient adipose tissue to comfortably overlay the implanted device.  Suprapubic catheter (SP tube) placement. Only done in severely refractory OAB when all other options have failed or are not a viable treatment choice depending on patient factors. Involves placement of a catheter through the lower abdomen into the bladder to continuously drain the bladder into an external collection bag, which patient can then empty at their convenience every few hours. Done via an outpatient surgical procedure in the OR under anesthesia. Risks may included but are not limited to: surgical site pain, infections, skin irritation / breakdown,  chronic bacteriuria, symptomatic UTls. The SP tube must stay in place continuously. This is a reversible procedure however - the insertion site will close if catheter is removed for more than a few hours. The SP tube must be exchanged routinely every 4 weeks to prevent the catheter from becoming clogged with sediment. SP tube exchanges are typically performed at a urology nurse visit or by a home health nurse.   Electronically signed by:  Donnita Falls, MSN, FNP-C, CUNP 05/12/2023 12:01 PM

## 2023-05-11 NOTE — Telephone Encounter (Signed)
Hailey at [facility] confirmed she faxed over UA and urine cultures.

## 2023-05-12 ENCOUNTER — Encounter: Payer: Self-pay | Admitting: Urology

## 2023-05-12 ENCOUNTER — Other Ambulatory Visit: Payer: Self-pay | Admitting: *Deleted

## 2023-05-12 ENCOUNTER — Ambulatory Visit: Payer: BC Managed Care – PPO | Admitting: Urology

## 2023-05-12 VITALS — BP 134/65 | HR 97

## 2023-05-12 DIAGNOSIS — K7460 Unspecified cirrhosis of liver: Secondary | ICD-10-CM

## 2023-05-12 DIAGNOSIS — D500 Iron deficiency anemia secondary to blood loss (chronic): Secondary | ICD-10-CM

## 2023-05-12 DIAGNOSIS — Z87440 Personal history of urinary (tract) infections: Secondary | ICD-10-CM

## 2023-05-12 DIAGNOSIS — N390 Urinary tract infection, site not specified: Secondary | ICD-10-CM | POA: Insufficient documentation

## 2023-05-12 DIAGNOSIS — G35 Multiple sclerosis: Secondary | ICD-10-CM

## 2023-05-12 DIAGNOSIS — N898 Other specified noninflammatory disorders of vagina: Secondary | ICD-10-CM | POA: Diagnosis not present

## 2023-05-12 DIAGNOSIS — K5909 Other constipation: Secondary | ICD-10-CM

## 2023-05-12 DIAGNOSIS — R3915 Urgency of urination: Secondary | ICD-10-CM

## 2023-05-12 DIAGNOSIS — N3941 Urge incontinence: Secondary | ICD-10-CM | POA: Diagnosis not present

## 2023-05-12 DIAGNOSIS — N3281 Overactive bladder: Secondary | ICD-10-CM

## 2023-05-12 DIAGNOSIS — R7989 Other specified abnormal findings of blood chemistry: Secondary | ICD-10-CM

## 2023-05-12 DIAGNOSIS — Z8619 Personal history of other infectious and parasitic diseases: Secondary | ICD-10-CM | POA: Insufficient documentation

## 2023-05-12 DIAGNOSIS — R350 Frequency of micturition: Secondary | ICD-10-CM | POA: Diagnosis not present

## 2023-05-12 DIAGNOSIS — N952 Postmenopausal atrophic vaginitis: Secondary | ICD-10-CM | POA: Insufficient documentation

## 2023-05-12 MED ORDER — GEMTESA 75 MG PO TABS
1.0000 | ORAL_TABLET | Freq: Every day | ORAL | 11 refills | Status: AC
Start: 2023-05-12 — End: ?

## 2023-05-12 MED ORDER — ESTRADIOL 0.1 MG/GM VA CREA
TOPICAL_CREAM | VAGINAL | 3 refills | Status: AC
Start: 2023-05-12 — End: ?

## 2023-05-12 NOTE — Progress Notes (Signed)
Bladder Scan completed today.  Patient cannot void prior to the bladder scan. Bladder scan result: 24  Performed By: Alfonse Spruce. CMA  Additional notes-

## 2023-05-12 NOTE — Patient Instructions (Addendum)
UTI prevention / management:  Difference between Urinalysis (urine dipstick test) and Urine culture:  Urinalysis (urine dipstick test): A quick office test used as an indicator to determine whether or not further testing is necessary (such as a urine culture, urine microscopy, etc.) The urinalysis cannot differentiate a true bacterial UTI or give a definitive diagnosis for the findings.  Urine culture: May be performed based on the findings of a urinalysis to evaluate for UTI. Grows out on a petri dish for 48-72 hours. Provides important information about: whether or not bacterial growth is present and if so: what the predominant bacteria is which antibiotics will work best against that bacteria That information is important so that we can diagnose and treat patients appropriately as there are other conditions which may mimic UTls which must not be missed (such as cancer, interstitial cystitis, stones, etc.). Assists [place] with antibiotic stewardship to minimize patient's risk for developing antibiotic resistance (getting to a point where no antibiotics work anymore).  Options when UTI symptoms occur: 1. Call [facility] and request to speak with triage nurse (phone # [phone], select option 3). In accordance with clinic guidelines the nurse will determine next steps based on patient-reported symptoms, which may include: same-day lab visit to provide urine specimen, recommendation to schedule Urology office visit appointment for further evaluation, recommendation to proceed to ER, etc. 2. Call your Primary Care Provider (PCP) office to request urgent / same-day visit. Be sure to request for urine culture to be ordered and have results faxed to Urology (fax # [phone]).  3. Go to urgent care. Be sure to request for urine culture to be ordered and have results faxed to Urology (fax # [phone]).   For bladder pain/ burning with urination: - Can take over-the-counter  Pyridium (phenazopyridine; commonly known under the "AZO" brand) for a few days as needed. Limit use to no more than 3 days consecutively due to risk for methemoglobinemia, liver function issues, and bone health damage with long term use of Pyridium. - Alternative: Prescription urinary analgesics (such as Uribel, Urogesic blue, Urelle, Uro-MP). Often expensive / poorly covered by insurance unfortunately.  Options / recommendations for UTI prevention: - Topical vaginal estrogen for vaginal atrophy (aka Genitourinary Syndrome of Menopause (GSM)). - Adequate daily fluid intake to flush out the urinary tract. - Go to the bathroom to urinate every 4-6 hours while awake to minimize urinary stasis / bacterial overgrowth in the bladder. - Proanthocyanidin (PAC) supplement 36 mg daily; must be soluble (insoluble form of PAC will be ineffective). Recommended brand: Ellura. This is an over-the-counter supplement (often must be found/ purchased online) supplement derived from cranberries with concentrated active component: Proanthocyanidin (PAC) 36 mg daily. Decreases bacterial adherence to bladder lining.  - D-mannose powder (2 grams daily). This is an over-the-counter supplement which decreases bacterial adherence to bladder lining (it is a sugar that inhibits bacterial adherence to urothelial cells by binding to the pili of enteric bacteria). Take as per manufacturer recommendation. Can be used as an alternative or in addition to the concentrated cranberry supplement.  - Vitamin C supplement to acidify urine to minimize bacterial growth.  - Probiotic to maintain healthy vaginal microbiome to suppress bacteria at urethral opening. Brand recommendations: Darrold Junker (includes probiotic & D-mannose ), Feminine Balance (highest concentration of lactobacillus) or Hyperbiotic Pro 15.  Note for patients with diabetes:  - Be aware that D-mannose contains sugar.  Note for patients with interstitial cystitis (IC):  -  Patients with IC should  typically avoid cranberry/ PAC supplements and Vitamin C supplements due to their acidity, which may exacerbate IC-related bladder pain. - Symptoms of true bacterial UTI can overlap / mimic symptoms of an IC flare up. Antibiotic use is NOT indicated for IC flare ups. Urine culture needed prior to antibiotic treatment for IC patients. The goal is to minimize your risk for developing antibiotic-resistant bacteria.    Vaginal atrophy I Genitourinary syndrome of menopause (GSM):  What it is: Changes in the vaginal environment (including the vulva and urethra) including: Thinning of the epithelium (skin/ mucosa surface) Can contribute to urinary urgency and frequency Can contribute to dryness, itching, irritation of the vulvar and vaginal tissue Can contribute to pain with intercourse Can contribute to physical changes of the labia, vulva, and vagina such as: Narrowing of the vaginal opening Decreased vaginal length Loss of labial architecture Labial adhesions Pale color of vulvovaginal tissue Loss of pubic hair Allows bacteria to become adherent  Results in increased risk for urinary tract infection (UTI) due to bacterial overgrowth and migration up the urethra into the bladder Change in vaginal pH (acid/ base balance) Allows for alteration / disruption of the normal bacterial flora / microbiome Results in increased risk for urinary tract infection (UTI) due to bacterial overgrowth  Treatment options: Over-the-counter lubricants (see list below). Prescription vaginal estrogen replacement. Options: Topical vaginal estrogen cream Estrace, Premarin, or compounded estradiol cream/ gel We advise: Discard plastic applicator as that tends to use more medication than you need, which is not harmful but wastes / uses up the medication. Also the plastic applicator may cause discomfort. Insert blueberry size amount of medication via the tip of your finger inside vagina  nightly for 1 week then 2-3 times per week (long term). Estring vaginal ring Exchanged every 3 months (either at home or in office by provider) Vagifem vaginal tablet Inserted nightly for 2 weeks then twice a week (long term) lntrarosa vaginal suppository Vaginal DHEA: converts to estrogen in vaginal tissue without systemic effect Inserted nightly (long term) Vaginal laser therapy (Mona Lisa touch) Performed in 3 treatments each 6 weeks apart (available in our [place] office). Can feel like a sunburn for 3-4 days after each treatment until new skin heals in. Usually not covered by insurance. Estimated cost is $1500 for all 3 sessions.  FYI regarding prescription vaginal estrogen treatment options: All topical vaginal estrogen replacement options are equivalent in terms of efficacy. Topical vaginal estrogen replacement will take about 3 months to be effective. OK to have sex with any of the topical vaginal estrogen replacement options. Topical vaginal estrogen replacement may sting/burn initially due to severe dryness, which will improve with ongoing treatment. There have been studies that evaluate use of low-dose intravaginal estrogen that show minimal systemic absorption which is negligible after 3 weeks. There have been no studies indicating increased risk of contributing to cancer development or recurrence.  Topical vaginal estrogen cream safe to use with breast cancer history [email or web address]  Topical vaginal estrogen cream safe to use with blood clot history [email or web address]   Lubricants and Moisturizers for Treating Genitourinary Syndrome of Menopause and Vulvovaginal Atrophy Treatment Comments I Available Products   Lubricants    Water-based Ingredients: Deionized water, glycerin, propylene glycol; latex safe; rare irritation; dry out with extended sexual activity Astroglide, Good Clean Love, K-Y Jelly, Natural, Organic, Pink, Sliquid, Sylk, Yes    Oil Based Ingredients: avocado, olive, peanut, corn; latex safe; can be used with silicone products; staining; safe (unless peanut allergy); non-irritating Coconut  oil, vegetable oil, vitamin E oil  Silicone-Based Ingredients: Silicone polymers; staining; typically nonirritating, long lasting; waterproof; should not be used with silicone dilators, sexual toys, or gynecologic products Astroglide X, Oceanus Ultra Pure, Pink Silicone, Pjur Eros, Replens Silky Smooth, Silicone Premium JO, SKYN, Uberlube, [employer] Based Minimize harm to sperm motility; designed Astroglide TTC, Conceive Plus, Pre for couples trying to conceive Seed, Yes Baby  Fertility Friendly Minimize harm to sperm motility; designed Astroglide, TTC, Conceive Plus, Pre for couples trying to conceive Seed, Yes Baby  Vaginal Moisturizers   Vaginal Moisturizers For maintenance use 1 to 3 times weekly; can benefit women with dryness, chafing with AOL, and recurrent vaginal infections irrespective of sexual activity timing Balance Active Menopause Vaginal Moisturizing Lubricant, Canesintima Intimate Moisturizer, Replens, Rephresh, Sylk Natural Intimate Moisturizer, Yes Vaginal Moisturizer  Hybrids Properties of both water and silicone-based products (combination of a vaginal lubricant and moisturizer); Non-irritating; good option for women with allergies and sensitivities Lubrigyn, Luvena  Suppositories Hyaluronic acid to retain moisture Revaree  Vulvar Soothing Creams/Oils    Medicated CreamsP ain and burn relief; Ingredients: 4% Lidocaine, Aloe Vera gel Releveum ([place])  Non-Medicated Creams For anti-itch and moisture/maintenance; Ingredients: Coconut oil, Avocado oil, Shea Butter,  Olive oil, Vitamin E Vajuvenate, Vmagic  Oils !For moisture/maintenance !Coconut oil, Vitamin E oil, Emu oil                 Overactive bladder (OAB) overview for patients:  Symptoms may include: urinary urgency ("gotta go" feeling) urinary frequency (voiding >8 times per day) night time urination (nocturia) urge incontinence of urine (UUI)  While we do not know the exact etiology of OAB, several treatment options exist including:  Behavioral therapy: Reducing fluid intake Decreasing bladder stimulants (such as caffeine) and irritants (such as acidic food, spicy foods, alcohol) Urge suppression strategies Bladder retraining via timed voiding  Pelvic floor physical therapy  Medication(s) - can use one or both of the drug classes below. Anticholinergic / antimuscarinic medications:  Mechanism of action: Activate M3 receptors to reduce detrusor stimulation and increase bladder capacity  (parasympathetic nervous system). Effect: Relaxes the bladder to decrease overactivity, increase bladder storage capacity, and increase time between voids. Onset: Slow acting (may take 8-12 weeks to determine efficacy). Medications include: Vesicare (Solifenacin), Ditropan (Oxybutynin), Detrol (Tolterodine), Toviaz (Fesoterodine), Sanctura (Trospium), Urispas (Flavoxate), Enablex (Darifenacin), Bentyl (Dicyclomine), Levsin (Hyoscyamine ). Potential side effects include but are not limited to: Dry eyes, dry mouth, constipation, cognitive impairment, dementia risk with long term use, and urinary retention/ incomplete bladder emptying. Insurance companies generally prefer for patients to try 1-2 anticholinergic / antimuscarinic medications first due to low cost. Some exceptions are made based on patient-specific comorbidities / risk factors. Beta-3 agonist medications: Mechanism of action: Stimulates selective B3 adrenergic receptors to cause smooth muscle bladder relaxation (sympathetic nervous  system). Effect: Relaxes the bladder to decrease overactivity, increase bladder storage capacity, and increase time between voids. Onset: Slow acting (may take 8-12 weeks to determine efficacy). Medications include: Myrbetriq (Mirabegron) and Vibegron Leslye Peer). Potential side effects include but are not limited to: urinary retention / incomplete bladder emptying and elevated blood pressure (more likely to occur in individuals with pre-existing uncontrolled hypertension). These medications tend to be more expensive than the anticholinergic / antimuscarinic medications.   For patients with refractory OAB (if the above treatment options have been unsuccessful): Posterior tibial nerve stimulation (PTNS). Small acupuncture-type needle inserted near ankle with electric current to stimulate bladder via posterior tibial nerve  pathway. Initially requires 12 weekly in-office treatments lasting 30 minutes each; followed by monthly in-office treatments lasting 30 minutes each for 1 year.  Bladder Botox injections. How it is done: Typically done via in-office cystoscopy; sometimes done in the OR depending on the situation. The bladder is numbed with lidocaine instilled via a catheter. Then the urologist injects Botox into the bladder muscle wall in about 20 locations. Causes local paralysis of the bladder muscle at the injection sites to reduce bladder muscle overactivity / spasms. The effect lasts for approximately 6 months and cannot be reversed once performed. Risks may included but are not limited to: infection, incomplete bladder emptying/ urinary retention, short term need for self-catheterization or indwelling catheter, and need for repeat therapy. There is a 5-12% chance of needing to catheterize with Botox - that usually resolves in a few months as the Botox wears off. Typically Botox injections would need to be repeated every 3-12 months since this is not a permanent therapy.  Sacral neuromodulation  trial (Medtronic lnterStim or Axonics implant). Sacral neuromodulation is FDA-approved for uncontrolled urinary urgency, urinary frequency, urinary urge incontinence, non-obstructive urinary retention, or fecal incontinence. It is not FDA-approved as a treatment for pain. The goal of this therapy is at least a 50% improvement in symptoms. It is NOT realistic to expect a 100% cure. This is a a 2-step outpatient procedure. After a successful test period, a permanent wire and generator are placed in the OR. We discussed the risk of infection. We reviewed the fact that about 30% of patients fail the test phase and are not candidates for permanent generator placement. During the 1-2 week trial phase, symptoms are documented by the patient to determine response. If patient gets at least a 50% improvement in symptoms, they may then proceed with Step 2. Step 1: Trial lead placement. Per physician discretion, may done one of two ways: Percutaneous nerve evaluation (PNE) in the [facility] urology office. Performed by urologist under local anesthesia (numbing the area with lidocaine) using a spinal needle for placement of test wire, which usually stays in place for 5-7 days to determine therapy response. Test lead placement in OR under anesthesia. Usually stays in place 2 weeks to determine therapy response. > Step 2: Permanent implantation of sacral neuromodulation device, which is performed in the OR.  Sacral neuromodulation implants: All are conditionally MRI safe. Manufacturer: Medtronic Website: [email or web address].gl therapy/right-for-you.[email or web address] Options: lnterStim X: Non-rechargeable. The battery lasts 10 years on average. lnterStim Micro: Rechargeable. The battery lasts 15 years on average and must be charged routinely. Approximately 50% smaller implant than lnterStim X implant.  Manufacturer: Axonics Website:  Findrealrelief.axonics.com Options: Non-rechargeable (Axonics F15): The battery lasts 15 years on average. Rechargeable (Axonics R20): The battery lasts 20 years on average and must be charged in office for about 1 hour every 6-10 months on average. Approximately 50% smaller implant than Axonics non-rechargeable implant.  Note: Generally the rechargeable devices are only advised for very small or thin patients who may not have sufficient adipose tissue to comfortably overlay the implanted device.  Suprapubic catheter (SP tube) placement. Only done in severely refractory OAB when all other options have failed or are not a viable treatment choice depending on patient factors. Involves placement of a catheter through the lower abdomen into the bladder to continuously drain the bladder into an external collection bag, which patient can then empty at their convenience every few hours. Done via an outpatient surgical procedure in the OR under anesthesia. Risks may included but  are not limited to: surgical site pain, infections, skin irritation / breakdown, chronic bacteriuria, symptomatic UTls. The SP tube must stay in place continuously. This is a reversible procedure however - the insertion site will close if catheter is removed for more than a few hours. The SP tube must be exchanged routinely every 4 weeks to prevent the catheter from becoming clogged with sediment. SP tube exchanges are typically performed at a urology nurse visit or by a home health nurse.

## 2023-05-17 ENCOUNTER — Telehealth: Payer: Self-pay

## 2023-05-17 DIAGNOSIS — N3281 Overactive bladder: Secondary | ICD-10-CM

## 2023-05-17 MED ORDER — GEMTESA 75 MG PO TABS
75.0000 mg | ORAL_TABLET | Freq: Every day | ORAL | Status: DC
Start: 2023-05-17 — End: 2023-08-11

## 2023-05-17 NOTE — Telephone Encounter (Signed)
-----   Message from [name] sent at 05/14/2023  2:41 PM EST ----- Please let pt know Gemtesa samples are now available for pick up at our front desk

## 2023-05-17 NOTE — Telephone Encounter (Signed)
 Patient is made aware and voiced understanding.

## 2023-05-20 ENCOUNTER — Ambulatory Visit: Payer: BC Managed Care – PPO | Admitting: Gastroenterology

## 2023-05-27 NOTE — Telephone Encounter (Signed)
 Samples sent back - samples were held for 10 days and not picked up .  Samples were placed up front on 05/14/23.

## 2023-07-07 ENCOUNTER — Ambulatory Visit: Payer: BC Managed Care – PPO | Admitting: Urology

## 2023-07-07 NOTE — Progress Notes (Deleted)
 Name: [name] DOB: June 15, 1965 MRN: [id number]  History of Present Illness: [name] is a 58 y.o. female who presents today for follow up visit at [facility]. ***She is accompanied by ***. GU history includes: 1. Recurrent UTI with history of urosepsis x2. 2. Vaginal atrophy.  3. OAB with urinary frequency, nocturia, urgency, and urge incontinence.   Urine culture results in past 12+ months: - 12/02/2021: Admitted for severe sepsis secondary E. coli UTI.  - 04/19/2022: Urine culture negative. - 04/25/2022: Admitted for sepsis due to colitis. - 07/15/2022: Admitted for sepsis secondary to E. coli UTI.  - 12/18/2022: Urine culture negative.  At initial visit on 05/12/2023: The plan was: 1. Start topical vaginal estrogen cream as prescribed. 2. Gemtesa (Vibegron) 75 mg daily.  *Note: Patient was made aware on 05/14/2023 that Gemtesa samples were placed at front desk for her to pick up, however after 10 days they were returned to stock after never being picked up.  3. Minimize caffeine intake. 4. Work on timed voiding / bladder retraining. 5. Return in about 8 weeks (around 07/07/2023) for UA, PVR, & f/u with Evette Georges NP.  Since last visit: ***  Today: She reports *** UTIs since last visit.  She {Actions; denies-reports:120008} increased urinary urgency, frequency, dysuria, gross hematuria, straining to void, or sensations of incomplete emptying.  She {Actions; denies-reports:120008} flank pain or abdominal pain. She {Actions; denies-reports:120008} fevers, nausea, or vomiting.  Medications: Current Outpatient Medications  Medication Sig Dispense Refill   ACCU-CHEK GUIDE test strip USE TO CHECK BLOOD SUGAR 4 TIMES DAILY AS DIRECTED 100 strip 0   Accu-Chek Softclix Lancets lancets USE TO CHECK BLOOD SUGAR 4 TIMES DAILY AS DIRECTED 100 each 0   acetaminophen (TYLENOL) 500 MG tablet Take 1,000 mg by mouth every 6 (six) hours as needed for moderate pain.      albuterol (ACCUNEB) 0.63 MG/3ML nebulizer solution Take 1 ampule by nebulization every 6 (six) hours as needed for wheezing or shortness of breath.     albuterol (VENTOLIN HFA) 108 (90 Base) MCG/ACT inhaler Inhale 1-2 puffs into the lungs every 6 (six) hours as needed for wheezing or shortness of breath.      baclofen (LIORESAL) 20 MG tablet Take 20 mg by mouth 3 (three) times daily.   3   blood glucose meter kit and supplies KIT Dispense based on patient and insurance preference. Use up to four times daily as directed. (FOR ICD-9 250.00, 250.01). 1 each 0   Blood Glucose Monitoring Suppl (ACCU-CHEK GUIDE ME) w/Device KIT USE TO CHECK BLOOD SUGAR DAILY AS DIRECTED 1 kit 0   citalopram (CELEXA) 40 MG tablet Take 40 mg by mouth every evening.      clopidogrel (PLAVIX) 75 MG tablet Take 75 mg by mouth daily.     Continuous Blood Gluc Receiver (DEXCOM G7 RECEIVER) DEVI Use to check glucose as directed 1 each 0   Continuous Blood Gluc Sensor (DEXCOM G7 SENSOR) MISC Change sensor every 10 days 3 each 2   estradiol (ESTRACE) 0.1 MG/GM vaginal cream Discard plastic applicator. Insert a blueberry size amount (approximately 1 gram) of cream on fingertip inside vagina at bedtime every night for 1 week then every other night. For long term use. 30 g 3   gabapentin (NEURONTIN) 800 MG tablet Take 800 mg by mouth 4 (four) times daily.     insulin isophane & regular human KwikPen (NOVOLIN 70/30 KWIKPEN) (70-30) 100 UNIT/ML KwikPen Inject 10 Units into  the skin 2 (two) times daily before a meal. (Patient taking differently: Inject 60 Units into the skin 2 (two) times daily before a meal.) 45 mL 2   Insulin Pen Needle (B-D ULTRAFINE III SHORT PEN) 31G X 8 MM MISC USE TO INJECT INSULIN 2 TIMES DAILY 100 each 2   lisinopril (ZESTRIL) 5 MG tablet Take 1 tablet (5 mg total) by mouth daily. 30 tablet 4   metFORMIN (GLUCOPHAGE) 1000 MG tablet Take 1 tablet (1,000 mg total) by mouth 2 (two) times daily with a meal. 60 tablet 4    metoprolol succinate (TOPROL-XL) 50 MG 24 hr tablet Take 1 tablet (50 mg total) by mouth daily. Take with or immediately following a meal. 90 tablet 3   nitrofurantoin, macrocrystal-monohydrate, (MACROBID) 100 MG capsule Take 100 mg by mouth daily.     ondansetron (ZOFRAN) 4 MG tablet Take 1 tablet (4 mg total) by mouth 4 (four) times daily as needed for nausea or vomiting. 20 tablet 0   oxycodone (ROXICODONE) 30 MG immediate release tablet Take 15 mg by mouth See admin instructions. Take 15 mg (1/2 tablet) by mouth in the morning and mid afternoon. Take an additional 15 mg (1/2 tablet) as needed.     pantoprazole (PROTONIX) 40 MG tablet Take 1 tablet (40 mg total) by mouth 2 (two) times daily. 180 tablet 3   pravastatin (PRAVACHOL) 40 MG tablet Take 1 tablet (40 mg total) by mouth every evening. 30 tablet 1   rOPINIRole (REQUIP) 0.5 MG tablet Take 0.5 mg by mouth at bedtime.     Vibegron (GEMTESA) 75 MG TABS Take 1 tablet (75 mg total) by mouth daily. 30 tablet 11   Vibegron (GEMTESA) 75 MG TABS Take 1 tablet (75 mg total) by mouth daily.     No current facility-administered medications for this visit.    Allergies: Allergies  Allergen Reactions   Peanut-Containing Drug Products Anaphylaxis and Hives    Patient states she is allergic to all nuts   Shellfish Allergy Anaphylaxis   Gluten Meal    Latex Other (See Comments)    Reaction: blistering   Prednisone Hives and Other (See Comments)    Reaction: causes psychological issues    Nexium [Esomeprazole Magnesium] Hives   Tape Rash    Paper tape is ok to use    Past Medical History:  Diagnosis Date   Abnormal EKG 04/16/2012   Left Axis deviation 04/16/2012   Asthma    Chest pain 04/16/2012   Chronic abdominal pain    Chronic neck pain    C4-6 fusion   Chronic pain syndrome 01/11/2012   Class 2 severe obesity due to excess calories with serious comorbidity and body mass index (BMI) of 35.0 to 35.9 in adult (HCC) 03/15/2017    CONSTIPATION 05/16/2009   Qualifier: Diagnosis of  By: Yetta Barre FNP-BC, Kandice L    Diabetes mellitus without complication (HCC)    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 Norma [facility]rickson at [facility]   Non-adherence to medical treatment 05/14/2009   Qualifier: Diagnosis of   By: Ricard Dillon       RECTAL BLEEDING 07/05/2009   Qualifier: Diagnosis of   By: Minna Merritts       Sleep apnea    Spondylosis of thoracic region without myelopathy or radiculopathy 07/02/2014   Uncontrolled type 2 diabetes mellitus with hyperglycemia (HCC) 03/15/2017   Past Surgical History:  Procedure Laterality Date   ABDOMINAL HYSTERECTOMY     APPENDECTOMY     BACK SURGERY  03/07/2012   Nerve Stimulator   BIOPSY  04/23/2022   Procedure: BIOPSY;  Surgeon: Dolores Frame, MD;  Location: [facility];  Service: Gastroenterology;;   CHOLECYSTECTOMY     COLONOSCOPY  07/09/2009   RMR; normal rectum aside from anal canal hemorrhoids/scattered left-sided diverticula   COLONOSCOPY WITH PROPOFOL N/A 04/23/2022   Procedure: COLONOSCOPY WITH PROPOFOL;  Surgeon: Dolores Frame, MD;  Location: [facility];  Service: Gastroenterology;  Laterality: N/A;   ESOPHAGOGASTRODUODENOSCOPY  05/22/2009   RMR; normal /small HH   ESOPHAGOGASTRODUODENOSCOPY  2007   RMR: non-critical Schatzki's rin, non-maniuplated   ESOPHAGOGASTRODUODENOSCOPY N/A 10/24/2013   Procedure: ESOPHAGOGASTRODUODENOSCOPY (EGD);  Surgeon: Corbin Ade,  MD;  Location: [facility];  Service: Endoscopy;  Laterality: N/A;  9:45   ESOPHAGOGASTRODUODENOSCOPY N/A 04/23/2022   Procedure: ESOPHAGOGASTRODUODENOSCOPY (EGD);  Surgeon: Marguerita Merles, Reuel Boom, MD;  Location: [facility];  Service: Gastroenterology;  Laterality: N/A;   ESOPHAGOGASTRODUODENOSCOPY (EGD) WITH ESOPHAGEAL DILATION  03/17/2012   WUJ:[id number] appearing esophageal mucosa of uncertain significance. Status post [name] dilation followed by esophageal bx   ESOPHAGOGASTRODUODENOSCOPY (EGD) WITH PROPOFOL N/A 07/24/2022   Procedure: ESOPHAGOGASTRODUODENOSCOPY (EGD) WITH PROPOFOL;  Surgeon: Corbin Ade, MD;  Location: [facility];  Service: Endoscopy;  Laterality: N/A;  7:30 am   MALONEY DILATION N/A 10/24/2013   Procedure: [name] DILATION;  Surgeon: Corbin Ade, MD;  Location: [facility];  Service: Endoscopy;  Laterality: N/A;   MANDIBLE SURGERY     NECK SURGERY     X2, last time 2013   Family History  Problem Relation Age of Onset   Cancer Mother        unknown type   Diabetes Father    Diabetes Sister    Colon cancer Neg Hx    Social History   Socioeconomic History   Marital status: Married    Spouse name: Not on file   Number of children: Not on file   Years of education: Not on file   Highest education level: Not on file  Occupational History   Not on file  Tobacco Use   Smoking status: Never   Smokeless tobacco: Never  Vaping Use   Vaping status: Never Used  Substance and Sexual Activity   Alcohol use: Yes    Comment: only three times a year.   Drug use: No   Sexual activity: Yes    Birth control/protection: Surgical  Other Topics Concern   Not on file  Social History Narrative   5 kids, raising grandchild   Social Drivers of Health   Financial Resource Strain: Not on file  Food Insecurity: Patient Declined (12/18/2022)   Hunger Vital Sign    Worried About Running Out of Food in the Last Year: Patient declined    Ran Out of Food in the  Last Year: Patient declined  Transportation Needs: Patient Declined (12/18/2022)   PRAPARE -  Administrator, Civil Service (Medical): Patient declined    Lack of Transportation (Non-Medical): Patient declined  Physical Activity: Not on file  Stress: Not on file  Social Connections: Not on file  Intimate Partner Violence: Patient Declined (12/18/2022)   Humiliation, Afraid, Rape, and Kick questionnaire    Fear of Current or Ex-Partner: Patient declined    Emotionally Abused: Patient declined    Physically Abused: Patient declined    Sexually Abused: Patient declined    Review of Systems Constitutional: Patient denies any unintentional weight loss or change in strength lntegumentary: Patient denies any rashes or pruritus Cardiovascular: Patient denies chest pain or syncope Respiratory: Patient denies shortness of breath Gastrointestinal: ***Patient {Actions; denies-reports:120008} ***nausea, ***vomiting, ***constipation, ***diarrhea ***As per HPI Musculoskeletal: Patient denies muscle cramps or weakness Neurologic: Patient denies convulsions or seizures Allergic/Immunologic: Patient denies recent allergic reaction(s) Hematologic/Lymphatic: Patient denies bleeding tendencies Endocrine: Patient denies heat/cold intolerance  GU: As per HPI.  OBJECTIVE There were no vitals filed for this visit. There is no height or weight on file to calculate BMI.  Physical Examination Constitutional: No obvious distress; patient is non-toxic appearing  Cardiovascular: No visible lower extremity edema.  Respiratory: The patient does not have audible wheezing/stridor; respirations do not appear labored  Gastrointestinal: Abdomen non-distended Musculoskeletal: Normal ROM of UEs  Skin: No obvious rashes/open sores  Neurologic: CN 2-12 grossly intact Psychiatric: Answered questions appropriately with normal affect  Hematologic/Lymphatic/Immunologic: No obvious bruises or sites of  spontaneous bleeding  UA: ***negative ***positive for *** leukocytes, *** blood, ***nitrites Urine microscopy: *** WBC/hpf, *** RBC/hpf, *** bacteria ***otherwise unremarkable ***glucosuria (secondary to ***Jardiance ***Farxiga use)  PVR: *** ml  ASSESSMENT Recurrent UTI  OAB (overactive bladder)  ***  We agreed to plan for follow up in *** months or sooner if needed. Patient verbalized understanding of and agreement with current plan. All questions were answered.  PLAN Advised the following: 1. *** 2. ***No follow-ups on file.  No orders of the defined types were placed in this encounter.   It has been explained that the patient is to follow regularly with their PCP in addition to all other providers involved in their care and to follow instructions provided by these respective offices. Patient advised to contact urology clinic if any urologic-pertaining questions, concerns, new symptoms or problems arise in the interim period.  There are no Patient Instructions on file for this visit.  Electronically signed by:  Donnita Falls, FNP   07/07/23    12:16 PM

## 2023-08-11 ENCOUNTER — Other Ambulatory Visit: Payer: Self-pay

## 2023-08-11 ENCOUNTER — Emergency Department (HOSPITAL_COMMUNITY)

## 2023-08-11 ENCOUNTER — Encounter (HOSPITAL_COMMUNITY): Payer: Self-pay

## 2023-08-11 ENCOUNTER — Inpatient Hospital Stay (HOSPITAL_COMMUNITY)
Admission: EM | Admit: 2023-08-11 | Discharge: 2023-08-14 | DRG: 689 | Disposition: A | Discharge status: Home / Self Care | Attending: Internal Medicine | Admitting: Internal Medicine

## 2023-08-11 DIAGNOSIS — M5124 Other intervertebral disc displacement, thoracic region: Secondary | ICD-10-CM | POA: Diagnosis present

## 2023-08-11 DIAGNOSIS — M47814 Spondylosis without myelopathy or radiculopathy, thoracic region: Secondary | ICD-10-CM | POA: Diagnosis present

## 2023-08-11 DIAGNOSIS — R4182 Altered mental status, unspecified: Principal | ICD-10-CM

## 2023-08-11 DIAGNOSIS — J45909 Unspecified asthma, uncomplicated: Secondary | ICD-10-CM | POA: Diagnosis present

## 2023-08-11 DIAGNOSIS — Z6834 Body mass index (BMI) 34.0-34.9, adult: Secondary | ICD-10-CM

## 2023-08-11 DIAGNOSIS — Z87440 Personal history of urinary (tract) infections: Secondary | ICD-10-CM

## 2023-08-11 DIAGNOSIS — K760 Fatty (change of) liver, not elsewhere classified: Secondary | ICD-10-CM | POA: Diagnosis present

## 2023-08-11 DIAGNOSIS — Z91013 Allergy to seafood: Secondary | ICD-10-CM

## 2023-08-11 DIAGNOSIS — M549 Dorsalgia, unspecified: Secondary | ICD-10-CM | POA: Diagnosis present

## 2023-08-11 DIAGNOSIS — G35 Multiple sclerosis: Secondary | ICD-10-CM | POA: Diagnosis present

## 2023-08-11 DIAGNOSIS — R9431 Abnormal electrocardiogram [ECG] [EKG]: Secondary | ICD-10-CM | POA: Diagnosis present

## 2023-08-11 DIAGNOSIS — E66811 Obesity, class 1: Secondary | ICD-10-CM | POA: Diagnosis present

## 2023-08-11 DIAGNOSIS — Z794 Long term (current) use of insulin: Secondary | ICD-10-CM | POA: Diagnosis not present

## 2023-08-11 DIAGNOSIS — E785 Hyperlipidemia, unspecified: Secondary | ICD-10-CM | POA: Diagnosis present

## 2023-08-11 DIAGNOSIS — N3000 Acute cystitis without hematuria: Secondary | ICD-10-CM

## 2023-08-11 DIAGNOSIS — K7460 Unspecified cirrhosis of liver: Secondary | ICD-10-CM | POA: Diagnosis present

## 2023-08-11 DIAGNOSIS — W01198A Fall on same level from slipping, tripping and stumbling with subsequent striking against other object, initial encounter: Secondary | ICD-10-CM | POA: Diagnosis present

## 2023-08-11 DIAGNOSIS — Z9889 Other specified postprocedural states: Secondary | ICD-10-CM

## 2023-08-11 DIAGNOSIS — Z7902 Long term (current) use of antithrombotics/antiplatelets: Secondary | ICD-10-CM

## 2023-08-11 DIAGNOSIS — S8265XA Nondisplaced fracture of lateral malleolus of left fibula, initial encounter for closed fracture: Secondary | ICD-10-CM | POA: Diagnosis present

## 2023-08-11 DIAGNOSIS — Z91048 Other nonmedicinal substance allergy status: Secondary | ICD-10-CM

## 2023-08-11 DIAGNOSIS — G4730 Sleep apnea, unspecified: Secondary | ICD-10-CM | POA: Diagnosis present

## 2023-08-11 DIAGNOSIS — Y92091 Bathroom in other non-institutional residence as the place of occurrence of the external cause: Secondary | ICD-10-CM | POA: Diagnosis not present

## 2023-08-11 DIAGNOSIS — Z87892 Personal history of anaphylaxis: Secondary | ICD-10-CM

## 2023-08-11 DIAGNOSIS — Z809 Family history of malignant neoplasm, unspecified: Secondary | ICD-10-CM

## 2023-08-11 DIAGNOSIS — R109 Unspecified abdominal pain: Secondary | ICD-10-CM | POA: Diagnosis present

## 2023-08-11 DIAGNOSIS — N390 Urinary tract infection, site not specified: Principal | ICD-10-CM | POA: Diagnosis present

## 2023-08-11 DIAGNOSIS — I10 Essential (primary) hypertension: Secondary | ICD-10-CM | POA: Diagnosis present

## 2023-08-11 DIAGNOSIS — Z7984 Long term (current) use of oral hypoglycemic drugs: Secondary | ICD-10-CM

## 2023-08-11 DIAGNOSIS — K219 Gastro-esophageal reflux disease without esophagitis: Secondary | ICD-10-CM | POA: Diagnosis present

## 2023-08-11 DIAGNOSIS — G894 Chronic pain syndrome: Secondary | ICD-10-CM | POA: Diagnosis present

## 2023-08-11 DIAGNOSIS — E1165 Type 2 diabetes mellitus with hyperglycemia: Secondary | ICD-10-CM | POA: Diagnosis present

## 2023-08-11 DIAGNOSIS — W19XXXA Unspecified fall, initial encounter: Secondary | ICD-10-CM

## 2023-08-11 DIAGNOSIS — G8929 Other chronic pain: Secondary | ICD-10-CM | POA: Diagnosis present

## 2023-08-11 DIAGNOSIS — Y92009 Unspecified place in unspecified non-institutional (private) residence as the place of occurrence of the external cause: Secondary | ICD-10-CM

## 2023-08-11 DIAGNOSIS — G43009 Migraine without aura, not intractable, without status migrainosus: Secondary | ICD-10-CM | POA: Diagnosis present

## 2023-08-11 DIAGNOSIS — I639 Cerebral infarction, unspecified: Secondary | ICD-10-CM | POA: Diagnosis not present

## 2023-08-11 DIAGNOSIS — B9620 Unspecified Escherichia coli [E. coli] as the cause of diseases classified elsewhere: Secondary | ICD-10-CM | POA: Diagnosis present

## 2023-08-11 DIAGNOSIS — M542 Cervicalgia: Secondary | ICD-10-CM | POA: Diagnosis present

## 2023-08-11 DIAGNOSIS — Z833 Family history of diabetes mellitus: Secondary | ICD-10-CM

## 2023-08-11 DIAGNOSIS — Z888 Allergy status to other drugs, medicaments and biological substances status: Secondary | ICD-10-CM

## 2023-08-11 DIAGNOSIS — E1159 Type 2 diabetes mellitus with other circulatory complications: Secondary | ICD-10-CM | POA: Diagnosis present

## 2023-08-11 DIAGNOSIS — Z9049 Acquired absence of other specified parts of digestive tract: Secondary | ICD-10-CM

## 2023-08-11 DIAGNOSIS — Z90710 Acquired absence of both cervix and uterus: Secondary | ICD-10-CM

## 2023-08-11 DIAGNOSIS — F32A Depression, unspecified: Secondary | ICD-10-CM | POA: Diagnosis present

## 2023-08-11 DIAGNOSIS — G9341 Metabolic encephalopathy: Secondary | ICD-10-CM | POA: Diagnosis present

## 2023-08-11 DIAGNOSIS — Z9682 Presence of neurostimulator: Secondary | ICD-10-CM

## 2023-08-11 DIAGNOSIS — Z8619 Personal history of other infectious and parasitic diseases: Secondary | ICD-10-CM

## 2023-08-11 DIAGNOSIS — Z91018 Allergy to other foods: Secondary | ICD-10-CM

## 2023-08-11 DIAGNOSIS — Z91040 Latex allergy status: Secondary | ICD-10-CM

## 2023-08-11 DIAGNOSIS — Z981 Arthrodesis status: Secondary | ICD-10-CM

## 2023-08-11 DIAGNOSIS — Z91010 Allergy to peanuts: Secondary | ICD-10-CM

## 2023-08-11 DIAGNOSIS — Z8719 Personal history of other diseases of the digestive system: Secondary | ICD-10-CM

## 2023-08-11 DIAGNOSIS — Z8673 Personal history of transient ischemic attack (TIA), and cerebral infarction without residual deficits: Secondary | ICD-10-CM

## 2023-08-11 DIAGNOSIS — Z79899 Other long term (current) drug therapy: Secondary | ICD-10-CM

## 2023-08-11 LAB — COMPREHENSIVE METABOLIC PANEL WITH GFR
ALT: 22 U/L (ref 0–44)
AST: 38 U/L (ref 15–41)
Albumin: 2.7 g/dL — ABNORMAL LOW (ref 3.5–5.0)
Alkaline Phosphatase: 117 U/L (ref 38–126)
Anion gap: 7 (ref 5–15)
BUN: 14 mg/dL (ref 6–20)
CO2: 25 mmol/L (ref 22–32)
Calcium: 8.6 mg/dL — ABNORMAL LOW (ref 8.9–10.3)
Chloride: 106 mmol/L (ref 98–111)
Creatinine, Ser: 1.04 mg/dL — ABNORMAL HIGH (ref 0.44–1.00)
GFR, Estimated: >60 mL/min (ref >60)
Glucose, Bld: 195 mg/dL — ABNORMAL HIGH (ref 70–99)
Potassium: 4.8 mmol/L (ref 3.5–5.1)
Sodium: 138 mmol/L (ref 135–145)
Total Bilirubin: 0.6 mg/dL (ref 0.0–1.2)
Total Protein: 7.7 g/dL (ref 6.5–8.1)

## 2023-08-11 LAB — CBC WITH DIFFERENTIAL/PLATELET
Abs Immature Granulocytes: 0.01 10*3/uL (ref 0.00–0.07)
Basophils Absolute: 0 10*3/uL (ref 0.0–0.1)
Basophils Relative: 0 %
Eosinophils Absolute: 0 10*3/uL (ref 0.0–0.5)
Eosinophils Relative: 1 %
HCT: 32.8 % — ABNORMAL LOW (ref 36.0–46.0)
Hemoglobin: 10.3 g/dL — ABNORMAL LOW (ref 12.0–15.0)
Immature Granulocytes: 0 %
Lymphocytes Relative: 10 %
Lymphs Abs: 0.5 10*3/uL — ABNORMAL LOW (ref 0.7–4.0)
MCH: 26.8 pg (ref 26.0–34.0)
MCHC: 31.4 g/dL (ref 30.0–36.0)
MCV: 85.2 fL (ref 80.0–100.0)
Monocytes Absolute: 0.3 10*3/uL (ref 0.1–1.0)
Monocytes Relative: 6 %
Neutro Abs: 4 10*3/uL (ref 1.7–7.7)
Neutrophils Relative %: 83 %
Platelets: 141 10*3/uL — ABNORMAL LOW (ref 150–400)
RBC: 3.85 MIL/uL — ABNORMAL LOW (ref 3.87–5.11)
RDW: 15.9 % — ABNORMAL HIGH (ref 11.5–15.5)
WBC: 4.8 10*3/uL (ref 4.0–10.5)
nRBC: 0 % (ref 0.0–0.2)

## 2023-08-11 LAB — URINALYSIS, ROUTINE W REFLEX MICROSCOPIC
Bilirubin Urine: NEGATIVE
Glucose, UA: NEGATIVE mg/dL
Hgb urine dipstick: NEGATIVE
Ketones, ur: NEGATIVE mg/dL
Nitrite: NEGATIVE
Protein, ur: NEGATIVE mg/dL
Specific Gravity, Urine: 1.01 (ref 1.005–1.030)
WBC, UA: >50 WBC/hpf (ref 0–5)
pH: 6 (ref 5.0–8.0)

## 2023-08-11 LAB — I-STAT CHEM 8, ED
BUN: 16 mg/dL (ref 6–20)
Calcium, Ion: 1.12 mmol/L — ABNORMAL LOW (ref 1.15–1.40)
Chloride: 105 mmol/L (ref 98–111)
Creatinine, Ser: 1 mg/dL (ref 0.44–1.00)
Glucose, Bld: 194 mg/dL — ABNORMAL HIGH (ref 70–99)
HCT: 32 % — ABNORMAL LOW (ref 36.0–46.0)
Hemoglobin: 10.9 g/dL — ABNORMAL LOW (ref 12.0–15.0)
Potassium: 4.9 mmol/L (ref 3.5–5.1)
Sodium: 139 mmol/L (ref 135–145)
TCO2: 26 mmol/L (ref 22–32)

## 2023-08-11 LAB — GLUCOSE, CAPILLARY: Glucose-Capillary: 130 mg/dL — ABNORMAL HIGH (ref 70–99)

## 2023-08-11 LAB — RAPID URINE DRUG SCREEN, HOSP PERFORMED
Amphetamines: NOT DETECTED
Barbiturates: NOT DETECTED
Benzodiazepines: NOT DETECTED
Cocaine: NOT DETECTED
Opiates: NOT DETECTED
Tetrahydrocannabinol: NOT DETECTED

## 2023-08-11 LAB — CBG MONITORING, ED
Glucose-Capillary: 204 mg/dL — ABNORMAL HIGH (ref 70–99)
Glucose-Capillary: 191 mg/dL — ABNORMAL HIGH (ref 70–99)

## 2023-08-11 LAB — HEMOGLOBIN A1C
Hgb A1c MFr Bld: 9.3 % — ABNORMAL HIGH (ref 4.8–5.6)
Mean Plasma Glucose: 220.21 mg/dL

## 2023-08-11 LAB — AMMONIA: Ammonia: 42 umol/L — ABNORMAL HIGH (ref 9–35)

## 2023-08-11 LAB — MAGNESIUM: Magnesium: 1.8 mg/dL (ref 1.7–2.4)

## 2023-08-11 LAB — LACTIC ACID, PLASMA: Lactic Acid, Venous: 1.1 mmol/L (ref 0.5–1.9)

## 2023-08-11 MED ORDER — PANTOPRAZOLE SODIUM 40 MG PO TBEC
40.0000 mg | DELAYED_RELEASE_TABLET | Freq: Two times a day (BID) | ORAL | Status: DC
  Administered 2023-08-12 – 2023-08-14 (×5): 40 mg via ORAL
  Filled 2023-08-11 (×5): qty 1

## 2023-08-11 MED ORDER — SODIUM CHLORIDE 0.9 % IV SOLN
2.0000 g | Freq: Once | INTRAVENOUS | Status: AC
  Administered 2023-08-11: 2 g via INTRAVENOUS
  Filled 2023-08-11: qty 20

## 2023-08-11 MED ORDER — INSULIN ASPART 100 UNIT/ML IJ SOLN
0.0000 [IU] | Freq: Four times a day (QID) | INTRAMUSCULAR | Status: DC
  Administered 2023-08-11: 1 [IU] via SUBCUTANEOUS

## 2023-08-11 MED ORDER — POLYETHYLENE GLYCOL 3350 17 G PO PACK: 17.0000 g | PACK | Freq: Every day | ORAL | Status: DC | PRN

## 2023-08-11 MED ORDER — SODIUM CHLORIDE 0.9 % IV SOLN
2.0000 g | INTRAVENOUS | Status: DC
  Administered 2023-08-12 – 2023-08-13 (×2): 2 g via INTRAVENOUS
  Filled 2023-08-11 (×2): qty 20

## 2023-08-11 MED ORDER — ROPINIROLE HCL 1 MG PO TABS
1.0000 mg | ORAL_TABLET | Freq: Every day | ORAL | Status: DC
  Administered 2023-08-11 – 2023-08-13 (×3): 1 mg via ORAL
  Filled 2023-08-11 (×3): qty 1

## 2023-08-11 MED ORDER — SODIUM CHLORIDE 0.9 % IV BOLUS
500.0000 mL | Freq: Once | INTRAVENOUS | Status: AC
  Administered 2023-08-11: 500 mL via INTRAVENOUS

## 2023-08-11 MED ORDER — ENOXAPARIN SODIUM 60 MG/0.6ML IJ SOSY
50.0000 mg | PREFILLED_SYRINGE | INTRAMUSCULAR | Status: DC
  Administered 2023-08-11: 50 mg via SUBCUTANEOUS
  Filled 2023-08-11: qty 0.6

## 2023-08-11 MED ORDER — ONDANSETRON HCL 4 MG/2ML IJ SOLN
4.0000 mg | Freq: Four times a day (QID) | INTRAMUSCULAR | Status: DC | PRN
  Administered 2023-08-13 (×2): 4 mg via INTRAVENOUS
  Filled 2023-08-11 (×2): qty 2

## 2023-08-11 MED ORDER — ALBUTEROL SULFATE (2.5 MG/3ML) 0.083% IN NEBU
2.5000 mg | INHALATION_SOLUTION | RESPIRATORY_TRACT | Status: DC | PRN
Start: 2023-08-11 — End: 2023-08-14

## 2023-08-11 MED ORDER — LISINOPRIL 10 MG PO TABS
10.0000 mg | ORAL_TABLET | Freq: Every day | ORAL | Status: DC
  Administered 2023-08-12 – 2023-08-14 (×3): 10 mg via ORAL
  Filled 2023-08-11 (×3): qty 1

## 2023-08-11 MED ORDER — ACETAMINOPHEN 650 MG RE SUPP: 650.0000 mg | Freq: Four times a day (QID) | RECTAL | Status: DC | PRN

## 2023-08-11 MED ORDER — BACLOFEN 10 MG PO TABS
20.0000 mg | ORAL_TABLET | Freq: Three times a day (TID) | ORAL | Status: DC
  Administered 2023-08-11 – 2023-08-14 (×8): 20 mg via ORAL
  Filled 2023-08-11 (×8): qty 2

## 2023-08-11 MED ORDER — SODIUM CHLORIDE 0.9 % IV BOLUS
1000.0000 mL | Freq: Once | INTRAVENOUS | Status: AC
  Administered 2023-08-11: 1000 mL via INTRAVENOUS

## 2023-08-11 MED ORDER — SODIUM CHLORIDE 0.9 % IV SOLN: INTRAVENOUS | Status: DC

## 2023-08-11 MED ORDER — METOPROLOL SUCCINATE ER 50 MG PO TB24
50.0000 mg | ORAL_TABLET | Freq: Every day | ORAL | Status: DC
  Administered 2023-08-12 – 2023-08-14 (×3): 50 mg via ORAL
  Filled 2023-08-11 (×3): qty 1

## 2023-08-11 MED ORDER — ONDANSETRON HCL 4 MG PO TABS
4.0000 mg | ORAL_TABLET | Freq: Four times a day (QID) | ORAL | Status: DC | PRN
  Administered 2023-08-13: 4 mg via ORAL
  Filled 2023-08-11: qty 1

## 2023-08-11 MED ORDER — ACETAMINOPHEN 325 MG PO TABS
650.0000 mg | ORAL_TABLET | Freq: Four times a day (QID) | ORAL | Status: DC | PRN
  Administered 2023-08-12: 650 mg via ORAL
  Filled 2023-08-11 (×2): qty 2

## 2023-08-11 NOTE — Assessment & Plan Note (Signed)
Stable. -Resume lisinopril, metoprolol 

## 2023-08-11 NOTE — ED Notes (Signed)
 ED TO INPATIENT HANDOFF REPORT  ED Nurse Name and Phone #:   S Name/Age/Gender [name] 58 y.o. female Room/Bed: APA15/APA15  Code Status   Code Status: Prior  Home/SNF/Other Home Patient oriented to: self, place, and situation Is this baseline? No   Triage Complete: Triage complete  Chief Complaint Acute metabolic encephalopathy [G93.41]  Triage Note Pt husband reports pt has been twitching and passing out today.  Husband says this has happened before but not sure why.   Allergies Allergies  Allergen Reactions   Peanut-Containing Drug Products Anaphylaxis and Hives    Patient states she is allergic to all nuts   Shellfish Allergy Anaphylaxis   Gluten Meal Other (See Comments)    Unknown    Latex Other (See Comments)    Blistering   Prednisone Hives and Other (See Comments)    Causes psychological issues    Nexium [Esomeprazole Magnesium] Hives   Tape Rash    Paper tape is ok to use    Level of Care/Admitting Diagnosis ED Disposition     ED Disposition  Admit   Condition  --   Comment  Hospital Area: [facility] [[id number]]  Level of Care: Telemetry [5]  Covid Evaluation: Asymptomatic - no recent exposure (last 10 days) testing not required  Diagnosis: Acute metabolic encephalopathy [1610960]  Admitting Physician: EMOKPAE, EJIROGHENE E [[id number]  Attending Physician: EMOKPAE, EJIROGHENE E [id number]  Certification:: I certify this patient will need inpatient services for at least 2 midnights  Expected Medical Readiness: 08/13/2023          B Medical/Surgery History Past Medical History:  Diagnosis Date   Abnormal EKG 04/16/2012   Left Axis deviation 04/16/2012   Asthma    Chest pain 04/16/2012   Chronic abdominal pain    Chronic neck pain    C4-6 fusion   Chronic pain syndrome 01/11/2012   Class 2 severe obesity due to excess calories with serious comorbidity and body mass index (BMI) of 35.0 to 35.9 in adult (HCC) 03/15/2017   CONSTIPATION  05/16/2009   Qualifier: Diagnosis of  By: Rochelle Chu FNP-BC, Kandice L    Diabetes mellitus without complication (HCC)    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   Uncontrolled type 2 diabetes mellitus with hyperglycemia (HCC) 03/15/2017   Past Surgical History:  Procedure Laterality Date   ABDOMINAL HYSTERECTOMY     APPENDECTOMY     BACK SURGERY  03/07/2012   Nerve Stimulator   BIOPSY  04/23/2022   Procedure: BIOPSY;  Surgeon: Urban Garden, MD;  [facility];  Service: Gastroenterology;;   CHOLECYSTECTOMY     COLONOSCOPY  07/09/2009   RMR; normal rectum aside from anal canal hemorrhoids/scattered left-sided diverticula   COLONOSCOPY WITH PROPOFOL  N/A 04/23/2022   Procedure: COLONOSCOPY WITH PROPOFOL ;  Surgeon: Urban Garden, MD;  Location: [facility];  Service: Gastroenterology;  Laterality: N/A;   ESOPHAGOGASTRODUODENOSCOPY  05/22/2009   RMR; normal /small HH   ESOPHAGOGASTRODUODENOSCOPY  2007   RMR: non-critical Schatzki's rin, non-maniuplated   ESOPHAGOGASTRODUODENOSCOPY N/A 10/24/2013   Procedure: ESOPHAGOGASTRODUODENOSCOPY (EGD);  Surgeon: Suzette Espy, MD;  Location:  [facility];  Service: Endoscopy;  Laterality: N/A;  9:45   ESOPHAGOGASTRODUODENOSCOPY N/A 04/23/2022   Procedure: ESOPHAGOGASTRODUODENOSCOPY (EGD);  Surgeon: Umberto Ganong, Bearl Limes, MD;  Location: [facility];  Service: Gastroenterology;  Laterality: N/A;   ESOPHAGOGASTRODUODENOSCOPY (EGD) WITH ESOPHAGEAL DILATION  03/17/2012   BJY:NWGNFAOZ appearing esophageal mucosa of uncertain significance. Status post Londa Rival dilation followed by esophageal bx   ESOPHAGOGASTRODUODENOSCOPY (EGD) WITH PROPOFOL  N/A 07/24/2022   Procedure: ESOPHAGOGASTRODUODENOSCOPY (EGD) WITH PROPOFOL ;  Surgeon: Suzette Espy, MD;  [facility];  Service: Endoscopy;  Laterality: N/A;  7:30 am   MALONEY DILATION N/A 10/24/2013   Procedure: Londa Rival DILATION;  Surgeon: Suzette Espy, MD;  Location[facility];  Service: Endoscopy;  Laterality: N/A;   MANDIBLE SURGERY     NECK SURGERY     X2, last time 2013     A IV Location/Drains/Wounds Patient Lines/Drains/Airways Status     Active Line/Drains/Airways     Name Placement date Placement time Site Days   Peripheral IV 08/11/23 20 G 1" Anterior;Right Forearm 08/11/23  1525  Forearm  less than 1            Intake/Output Last 24 hours  Intake/Output Summary (Last 24 hours) at 08/11/2023 1947 Last data filed at 08/11/2023 1605 Gross per 24 hour  Intake 500 ml  Output --  Net 500 ml    Labs/Imaging Results for orders placed or performed during the hospital encounter of 08/11/23 (from the past 48 hours)  CBG monitoring, ED     Status: Abnormal   Collection Time: 08/11/23  3:08 PM  Result Value Ref Range   Glucose-Capillary 204 (H) 70 - 99 mg/dL    Comment: Glucose reference range applies only to samples taken after fasting for at least 8 hours.  CBC WITH DIFFERENTIAL     Status: Abnormal   Collection Time: 08/11/23  3:15 PM  Result Value Ref Range   WBC 4.8 4.0 - 10.5 K/uL   RBC 3.85 (L) 3.87 - 5.11 MIL/uL   Hemoglobin 10.3 (L) 12.0 -  15.0 g/dL   HCT 30.8 (L) 65.7 - 84.6 %   MCV 85.2 80.0 - 100.0 fL   MCH 26.8 26.0 - 34.0 pg   MCHC 31.4 30.0 - 36.0 g/dL   RDW 96.2 (H) 95.2 - 84.1 %   Platelets 141 (L) 150 - 400 K/uL   nRBC 0.0 0.0 - 0.2 %   Neutrophils Relative % 83 %   Neutro Abs 4.0 1.7 - 7.7 K/uL   Lymphocytes Relative 10 %   Lymphs Abs 0.5 (L) 0.7 - 4.0 K/uL   Monocytes Relative 6 %   Monocytes Absolute 0.3 0.1 - 1.0 K/uL   Eosinophils Relative 1 %   Eosinophils Absolute 0.0 0.0 - 0.5 K/uL   Basophils Relative 0 %   Basophils Absolute 0.0 0.0 - 0.1 K/uL   Immature Granulocytes 0 %   Abs Immature  Granulocytes 0.01 0.00 - 0.07 K/uL    Comment: Performed at [facility], [address] [place], [place] [address]  Comprehensive metabolic panel     Status: Abnormal   Collection Time: 08/11/23  3:15 PM  Result Value Ref Range   Sodium 138 135 - 145 mmol/L   Potassium 4.8 3.5 - 5.1 mmol/L   Chloride 106 98 - 111 mmol/L   CO2 25 22 - 32 mmol/L   Glucose, Bld 195 (H) 70 - 99 mg/dL    Comment: Glucose reference range applies only to samples taken after fasting for at least 8 hours.   BUN 14 6 - 20 mg/dL   Creatinine, Ser 5.64 (H) 0.44 - 1.00 mg/dL   Calcium 8.6 (L) 8.9 - 10.3 mg/dL   Total Protein 7.7 6.5 - 8.1 g/dL   Albumin 2.7 (L) 3.5 - 5.0 g/dL   AST 38 15 - 41 U/L   ALT 22 0 - 44 U/L   Alkaline Phosphatase 117 38 - 126 U/L   Total Bilirubin 0.6 0.0 - 1.2 mg/dL   GFR, Estimated >33 >29 mL/min    Comment: (NOTE) Calculated using the CKD-EPI Creatinine Equation (2021)    Anion gap 7 5 - 15    Comment: Performed at [facility], [address] [place], [place] [address]  Ammonia     Status: Abnormal   Collection Time: 08/11/23  3:15 PM  Result Value Ref Range   Ammonia 42 (H) 9 - 35 umol/L    Comment: Performed at [facility], [address] [place], [place] [address]  I-Stat Chem 8, ED     Status: Abnormal   Collection Time: 08/11/23  3:17 PM  Result Value Ref Range   Sodium 139 135  - 145 mmol/L   Potassium 4.9 3.5 - 5.1 mmol/L   Chloride 105 98 - 111 mmol/L   BUN 16 6 - 20 mg/dL   Creatinine, Ser 3.01 0.44 - 1.00 mg/dL   Glucose, Bld 601 (H) 70 - 99 mg/dL    Comment: Glucose reference range applies only to samples taken after fasting for at least 8 hours.   Calcium, Ion 1.12 (L) 1.15 - 1.40 mmol/L   TCO2 26 22 - 32 mmol/L   Hemoglobin 10.9 (L) 12.0 - 15.0 g/dL   HCT 09.3 (L) 23.5 - 57.3 %  POC CBG, ED     Status: Abnormal   Collection Time: 08/11/23  4:26 PM  Result Value Ref Range   Glucose-Capillary 191 (H) 70 - 99 mg/dL    Comment: Glucose reference range applies only to samples taken after fasting for at least 8 hours.  Urinalysis, Routine w reflex microscopic -Urine, Clean Catch     Status: Abnormal   Collection Time: 08/11/23  5:17 PM  Result Value Ref Range   Color, Urine YELLOW YELLOW   APPearance CLOUDY (A) CLEAR   Specific Gravity, Urine 1.010 1.005 - 1.030   pH 6.0 5.0 - 8.0   Glucose, UA NEGATIVE NEGATIVE mg/dL   Hgb urine dipstick NEGATIVE NEGATIVE   Bilirubin Urine NEGATIVE NEGATIVE   Ketones, ur NEGATIVE NEGATIVE mg/dL   Protein, ur NEGATIVE NEGATIVE mg/dL   Nitrite NEGATIVE NEGATIVE   Leukocytes,Ua LARGE (A) NEGATIVE   RBC / HPF 0-5 0 - 5 RBC/hpf   WBC, UA >50 0 - 5 WBC/hpf   Bacteria, UA RARE (A) NONE SEEN   Squamous Epithelial / HPF 0-5 0 - 5 /HPF   WBC  Clumps PRESENT     Comment: Performed at [facility], [address] [place], [place] [address]   CT Cervical Spine Wo Contrast Result Date: 08/11/2023 CLINICAL DATA:  Syncope, twitching, fell EXAM: CT CERVICAL SPINE WITHOUT CONTRAST TECHNIQUE: Multidetector CT imaging of the cervical spine was performed without intravenous contrast. Multiplanar CT image reconstructions were also generated. RADIATION DOSE REDUCTION: This exam was performed according to the departmental dose-optimization program which includes automated exposure control, adjustment of the mA and/or kV according to patient  size and/or use of iterative reconstruction technique. COMPARISON:  04/06/2020 FINDINGS: Alignment: Stable mild degenerative anterolisthesis of C3 on C4. Loss of cervical lordosis due to prior C4-C7 ACDF. Skull base and vertebrae: No acute fracture. No primary bone lesion or focal pathologic process. Soft tissues and spinal canal: No prevertebral fluid or swelling. No visible canal hematoma. Disc levels: Prior C4-C7 ACDF. There is moderate spondylosis at C3-4 with mild degenerative anterolisthesis as described above. Multilevel facet hypertrophy most pronounced at C2-3 and C3-4. Upper chest: Airway is patent. Visualized portions of the lung apices are clear. Other: Reconstructed images demonstrate no additional findings. IMPRESSION: 1. No acute cervical spine fracture. 2. Stable multilevel spondylosis and facet hypertrophy greatest at C3-4. 3. Stable C4-C7 ACDF. Electronically Signed   By: Bobbye Burrow M.D.   On: 08/11/2023 15:53   CT Head Wo Contrast Result Date: 08/11/2023 CLINICAL DATA:  Syncope, twitching, altered level of consciousness EXAM: CT HEAD WITHOUT CONTRAST TECHNIQUE: Contiguous axial images were obtained from the base of the skull through the vertex without intravenous contrast. RADIATION DOSE REDUCTION: This exam was performed according to the departmental dose-optimization program which includes automated exposure control, adjustment of the mA and/or kV according to patient size and/or use of iterative reconstruction technique. COMPARISON:  11/28/2021 FINDINGS: Brain: No acute infarct or hemorrhage. Lateral ventricles and midline structures are unremarkable. No acute extra-axial fluid collections. No mass effect. Vascular: No hyperdense vessel or unexpected calcification. Skull: Normal. Negative for fracture or focal lesion. Sinuses/Orbits: No acute finding. Other: None. IMPRESSION: 1. No acute intracranial process. Electronically Signed   By: Bobbye Burrow M.D.   On: 08/11/2023 15:46   DG  Ankle Complete Left Result Date: 08/11/2023 CLINICAL DATA:  Fall EXAM: LEFT ANKLE COMPLETE - 3+ VIEW COMPARISON:  None Available. FINDINGS: No malalignment. Acute avulsion type fracture injuries at the tip of the fibular malleolus. Lateral soft tissue swelling. Ankle mortise is symmetric. Moderate plantar calcaneal spur IMPRESSION: Acute avulsion type fracture fragments at the tip of the fibular malleolus with lateral soft tissue swelling. Electronically Signed   By: Esmeralda Hedge M.D.   On: 08/11/2023 15:15   DG Chest Port 1 View Result Date: 08/11/2023 CLINICAL DATA:  syncope EXAM: PORTABLE CHEST - 1 VIEW COMPARISON:  December 18, 2022 FINDINGS: Low lung volumes. No focal airspace consolidation, pleural effusion, or pneumothorax. Borderline cardiomegaly. No acute fracture or destructive lesion. Cervical fusion hardware. Spinal stimulator leads. IMPRESSION: Low lung volumes.  No acute cardiopulmonary abnormality. Electronically Signed   By: Rance Burrows M.D.   On: 08/11/2023 15:04    Pending Labs Unresulted Labs (From admission, onward)     Start     Ordered   08/11/23 1930  Lactic acid, plasma  (Lactic Acid)  Once,   R        08/11/23 1929   08/11/23 1903  Culture, blood (routine x 2)  BLOOD CULTURE X 2,   R (with TIMED occurrences)      08/11/23 1903  08/11/23 1828  Urine Culture  Add-on,   AD       Question:  Indication  Answer:  Altered mental status (if no other cause identified)   08/11/23 1827   08/11/23 1500  Urine rapid drug screen (hosp performed)  Once,   STAT        08/11/23 1459            Vitals/Pain Today's Vitals   08/11/23 1500 08/11/23 1730 08/11/23 1845 08/11/23 1930  BP: (!) 101/51 (!) 106/57  134/70  Pulse: 93 94 88 94  Resp: 11 12 10 19   Temp:    98.2 F (36.8 C)  TempSrc:    Oral  SpO2: 90% 94% 96% 96%  Weight:      Height:      PainSc:   Asleep     Isolation Precautions No active isolations  Medications Medications  sodium chloride  0.9 %  bolus 500 mL (0 mLs Intravenous Stopped 08/11/23 1605)  cefTRIAXone  (ROCEPHIN ) 2 g in sodium chloride  0.9 % 100 mL IVPB (0 g Intravenous Stopped 08/11/23 1942)  sodium chloride  0.9 % bolus 1,000 mL (1,000 mLs Intravenous New Bag/Given 08/11/23 1939)    Mobility walks with device     Focused Assessments    R Recommendations: See Admitting Provider Note  Report given to:   Additional Notes:

## 2023-08-11 NOTE — ED Notes (Signed)
 During initial assessment, Pt presents with AMS, and lethargy. Pt oriented to self and place. Pt presents with delayed verbal responses.

## 2023-08-11 NOTE — Assessment & Plan Note (Signed)
 Resume Baclofen

## 2023-08-11 NOTE — Assessment & Plan Note (Addendum)
 _ Hgba1c - SSI- M - Hold glipizide , metformin , home 7030 60 units twice daily

## 2023-08-11 NOTE — ED Notes (Signed)
 Pt transported to radiology.

## 2023-08-11 NOTE — ED Notes (Signed)
 Pt returned from Radiology.

## 2023-08-11 NOTE — Assessment & Plan Note (Addendum)
 Presenting with somnolence, falls, twitching, dysuria, delayed response to questions but speech clear. Head and Cx Ct without acute abnormality. Chest xray - clear. UA suggestive of UTI- with + Leukocytes, she also appears dehydrated. - IV fluids, IV ceftriazone - Hold psychoactive meds- Buspar, celexa , gabapentin , ropinirole  - NPO till inprovement in mental status - Delayed speech response likely metabolic etiology, if persistent, may need to get further brain imagine.

## 2023-08-11 NOTE — ED Provider Notes (Signed)
 [place] EMERGENCY DEPARTMENT AT [facility] Provider Note   CSN: [id number] Arrival date & time: 08/11/23  1338     History  Chief Complaint  Patient presents with   Near Syncope    [name] is a 58 y.o. female.   Near Syncope Pertinent negatives include no chest pain, no abdominal pain, no headaches and no shortness of breath.       [name] is a 58 y.o. female past medical history of MS, type 2 diabetes, cirrhosis, chronic pain syndrome, hypertension, asthma who presents to the Emergency Department complaining of syncope x 2 earlier today.  Patient's spouse provides most of history.  He states that she woke up earlier today began having some generalized twitching and passed out in the bedroom.  He states the episode was brief she did not lose consciousness he assisted her to the bed to lie down she attempted to get back up and fell while in the bathroom.  Believes that she struck her head on the toilet.  Patient states that she has had similar episodes like this in the past when she had a UTI.  Patient denies any chest pain or shortness of breath, headache, dizziness numbness or weakness, black or bloody stools, persistent vomiting or diarrhea.  No history of seizures.  Patient spouse denies any witnessed seizure-like activity.  She also has pain of the left ankle for few days after a mechanical fall.      Home Medications Prior to Admission medications   Medication Sig Start Date End Date Taking? Authorizing Provider  ACCU-CHEK GUIDE test strip USE TO CHECK BLOOD SUGAR 4 TIMES DAILY AS DIRECTED 03/06/22   Nida, Gebreselassie W, MD  Accu-Chek Softclix Lancets lancets USE TO CHECK BLOOD SUGAR 4 TIMES DAILY AS DIRECTED 03/06/22   Nida, Gebreselassie W, MD  acetaminophen  (TYLENOL ) 500 MG tablet Take 1,000 mg by mouth every 6 (six) hours as needed for moderate pain.    [provider]  albuterol  (PROVENTIL ) (2.5 MG/3ML) 0.083% nebulizer solution Take 2.5 mg  by nebulization every 4 (four) hours as needed for wheezing or shortness of breath. 07/30/23   [provider]  albuterol  (VENTOLIN  HFA) 108 (90 Base) MCG/ACT inhaler Inhale 1-2 puffs into the lungs every 6 (six) hours as needed for wheezing or shortness of breath.  01/02/19   [provider]  baclofen  (LIORESAL ) 20 MG tablet Take 20 mg by mouth 3 (three) times daily.  05/08/14   [provider]  blood glucose meter kit and supplies KIT Dispense based on patient and insurance preference. Use up to four times daily as directed. (FOR ICD-9 250.00, 250.01). 03/15/19   Enrigue Harvard, DO  Blood Glucose Monitoring Suppl (ACCU-CHEK GUIDE ME) w/Device KIT USE TO CHECK BLOOD SUGAR DAILY AS DIRECTED 12/31/21   Nida, Gebreselassie W, MD  busPIRone (BUSPAR) 10 MG tablet Take 10 mg by mouth daily as needed. 07/12/23   [provider]  citalopram  (CELEXA ) 40 MG tablet Take 40 mg by mouth every evening.     [provider]  clopidogrel  (PLAVIX ) 75 MG tablet Take 75 mg by mouth daily. 11/27/21   [provider]  Continuous Blood Gluc Receiver (DEXCOM G7 RECEIVER) DEVI Use to check glucose as directed 10/28/21   Nida, Gebreselassie W, MD  Continuous Blood Gluc Sensor (DEXCOM G7 SENSOR) MISC Change sensor every 10 days 01/19/22   Nida, Gebreselassie W, MD  estradiol  (ESTRACE ) 0.1 MG/GM vaginal cream Discard plastic applicator.  Insert a blueberry size amount (approximately 1 gram) of cream on fingertip inside vagina at bedtime every night for 1 week then every other night. For long term use. 05/12/23   Lauretta Ponto, FNP  gabapentin  (NEURONTIN ) 300 MG capsule Take 4-5 capsules by mouth daily. 07/12/23   [provider]  glipiZIDE  (GLUCOTROL ) 10 MG tablet Take 10 mg by mouth daily before breakfast. 07/12/23   [provider]  insulin  isophane & regular human KwikPen (NOVOLIN  70/30 KWIKPEN) (70-30) 100 UNIT/ML KwikPen Inject 10 Units into the skin 2 (two) times  daily before a meal. Patient taking differently: Inject 60 Units into the skin 2 (two) times daily before a meal. 12/02/21   Gwendalyn Lemma, MD  Insulin  Pen Needle (B-D ULTRAFINE III SHORT PEN) 31G X 8 MM MISC USE TO INJECT INSULIN  2 TIMES DAILY 06/19/22   Nida, Gebreselassie W, MD  lisinopril  (ZESTRIL ) 10 MG tablet Take 10 mg by mouth daily. 07/29/23   [provider]  metFORMIN  (GLUCOPHAGE ) 500 MG tablet Take 1,000 mg by mouth 2 (two) times daily. 07/12/23   [provider]  metoprolol  succinate (TOPROL -XL) 50 MG 24 hr tablet Take 1 tablet (50 mg total) by mouth daily. Take with or immediately following a meal. 04/25/22 04/20/23  Colin Dawley, MD  nitrofurantoin, macrocrystal-monohydrate, (MACROBID) 100 MG capsule Take 100 mg by mouth daily. 11/20/22   [provider]  ondansetron  (ZOFRAN ) 4 MG tablet Take 1 tablet (4 mg total) by mouth 4 (four) times daily as needed for nausea or vomiting. 04/25/22   Colin Dawley, MD  oxycodone  (ROXICODONE ) 30 MG immediate release tablet Take 15 mg by mouth See admin instructions. Take 15 mg (1/2 tablet) by mouth in the morning and mid afternoon. Take an additional 15 mg (1/2 tablet) as needed. 10/31/21   [provider]  pantoprazole  (PROTONIX ) 40 MG tablet Take 1 tablet (40 mg total) by mouth 2 (two) times daily. 11/17/22 11/17/23  Eustacio Highman, NP  pravastatin  (PRAVACHOL ) 40 MG tablet Take 1 tablet (40 mg total) by mouth every evening. 03/15/19   Black, Lyna Sandhoff, NP  rOPINIRole  (REQUIP ) 0.5 MG tablet Take 0.5 mg by mouth at bedtime. 11/27/21   [provider]  Vibegron  (GEMTESA ) 75 MG TABS Take 1 tablet (75 mg total) by mouth daily. 05/12/23   Larocco, Sarah C, FNP  Vibegron  (GEMTESA ) 75 MG TABS Take 1 tablet (75 mg total) by mouth daily. 05/17/23   Larocco, Sarah C, FNP  Vitamin D , Ergocalciferol , (DRISDOL) 1.25 MG (50000 UNIT) CAPS capsule Take 50,000 Units by mouth once a week. 07/28/23   [provider]       Allergies    Peanut-containing drug products, Shellfish allergy, Gluten meal, Latex, Prednisone, Nexium [esomeprazole magnesium], and Tape    Review of Systems   Review of Systems  Constitutional:  Negative for chills and fever.  Respiratory:  Negative for shortness of breath.   Cardiovascular:  Positive for near-syncope. Negative for chest pain.  Gastrointestinal:  Negative for abdominal pain, nausea and vomiting.  Genitourinary:  Negative for difficulty urinating and dysuria.  Musculoskeletal:  Positive for arthralgias (left ankle pain and swelling).  Skin:  Negative for wound.  Neurological:  Positive for syncope. Negative for dizziness, speech difficulty and headaches.  Psychiatric/Behavioral:  Negative for confusion.     Physical Exam Updated Vital Signs BP (!) 156/88 (BP Location: Right Arm)   Pulse (!) 113   Temp 98.2 F (36.8 C) (Oral)   Resp  20   Ht 5\' 5"  (1.651 m)   Wt 88.5 kg   SpO2 93%   BMI 32.45 kg/m  Physical Exam Vitals and nursing note reviewed.  Constitutional:      Appearance: She is not toxic-appearing or diaphoretic.     Comments: Patient is somnolent, awakes for questions   HENT:     Head: Atraumatic.     Mouth/Throat:     Mouth: Mucous membranes are moist.     Comments: No obvious dental injury, there is dried blood on the lower lip.  No open wound. Eyes:     Extraocular Movements: Extraocular movements intact.     Conjunctiva/sclera: Conjunctivae normal.     Pupils: Pupils are equal, round, and reactive to light.  Cardiovascular:     Rate and Rhythm: Regular rhythm. Tachycardia present.     Pulses: Normal pulses.  Pulmonary:     Effort: Pulmonary effort is normal.     Breath sounds: Normal breath sounds.  Chest:     Chest wall: No tenderness.  Abdominal:     Palpations: Abdomen is soft.     Tenderness: There is no abdominal tenderness.  Musculoskeletal:     Cervical back: Normal range of motion.     Left lower leg: Edema present.      Comments: Ttp and swelling over left malleolus  no bony deformity.  Compartments of the extremity are soft  Skin:    General: Skin is warm.     Capillary Refill: Capillary refill takes less than 2 seconds.     Coloration: Skin is not jaundiced.  Neurological:     General: No focal deficit present.     Sensory: No sensory deficit.     Motor: No weakness.     ED Results / Procedures / Treatments   Labs (all labs ordered are listed, but only abnormal results are displayed) Labs Reviewed  CBC WITH DIFFERENTIAL/PLATELET - Abnormal; Notable for the following components:      Result Value   RBC 3.85 (*)    Hemoglobin 10.3 (*)    HCT 32.8 (*)    RDW 15.9 (*)    Platelets 141 (*)    Lymphs Abs 0.5 (*)    All other components within normal limits  COMPREHENSIVE METABOLIC PANEL WITH GFR - Abnormal; Notable for the following components:   Glucose, Bld 195 (*)    Creatinine, Ser 1.04 (*)    Calcium 8.6 (*)    Albumin 2.7 (*)    All other components within normal limits  CBG MONITORING, ED - Abnormal; Notable for the following components:   Glucose-Capillary 204 (*)    All other components within normal limits  I-STAT CHEM 8, ED - Abnormal; Notable for the following components:   Glucose, Bld 194 (*)    Calcium, Ion 1.12 (*)    Hemoglobin 10.9 (*)    HCT 32.0 (*)    All other components within normal limits  CBG MONITORING, ED - Abnormal; Notable for the following components:   Glucose-Capillary 191 (*)    All other components within normal limits  URINALYSIS, ROUTINE W REFLEX MICROSCOPIC  AMMONIA  RAPID URINE DRUG SCREEN, HOSP PERFORMED    EKG EKG Interpretation Date/Time:  Wednesday Aug 11 2023 14:11:17 EDT Ventricular Rate:  105 PR Interval:  161 QRS Duration:  101 QT Interval:  388 QTC Calculation: 513 R Axis:   -28  Text Interpretation: Sinus tachycardia Borderline left axis deviation Low voltage, precordial leads Prolonged QT  interval Since last tracing rate faster  Confirmed by Sueellen Emery [phone]) on 08/11/2023 3:27:23 PM  Radiology CT Head Wo Contrast Result Date: 08/11/2023 CLINICAL DATA:  Syncope, twitching, altered level of consciousness EXAM: CT HEAD WITHOUT CONTRAST TECHNIQUE: Contiguous axial images were obtained from the base of the skull through the vertex without intravenous contrast. RADIATION DOSE REDUCTION: This exam was performed according to the departmental dose-optimization program which includes automated exposure control, adjustment of the mA and/or kV according to patient size and/or use of iterative reconstruction technique. COMPARISON:  11/28/2021 FINDINGS: Brain: No acute infarct or hemorrhage. Lateral ventricles and midline structures are unremarkable. No acute extra-axial fluid collections. No mass effect. Vascular: No hyperdense vessel or unexpected calcification. Skull: Normal. Negative for fracture or focal lesion. Sinuses/Orbits: No acute finding. Other: None. IMPRESSION: 1. No acute intracranial process. Electronically Signed   By: Bobbye Burrow M.D.   On: 08/11/2023 15:46   DG Ankle Complete Left Result Date: 08/11/2023 CLINICAL DATA:  Fall EXAM: LEFT ANKLE COMPLETE - 3+ VIEW COMPARISON:  None Available. FINDINGS: No malalignment. Acute avulsion type fracture injuries at the tip of the fibular malleolus. Lateral soft tissue swelling. Ankle mortise is symmetric. Moderate plantar calcaneal spur IMPRESSION: Acute avulsion type fracture fragments at the tip of the fibular malleolus with lateral soft tissue swelling. Electronically Signed   By: Esmeralda Hedge M.D.   On: 08/11/2023 15:15   DG Chest Port 1 View Result Date: 08/11/2023 CLINICAL DATA:  syncope EXAM: PORTABLE CHEST - 1 VIEW COMPARISON:  December 18, 2022 FINDINGS: Low lung volumes. No focal airspace consolidation, pleural effusion, or pneumothorax. Borderline cardiomegaly. No acute fracture or destructive lesion. Cervical fusion hardware. Spinal stimulator leads. IMPRESSION:  Low lung volumes.  No acute cardiopulmonary abnormality. Electronically Signed   By: Rance Burrows M.D.   On: 08/11/2023 15:04    Procedures Procedures    Medications Ordered in ED Medications - No data to display  ED Course/ Medical Decision Making/ A&P                                 Medical Decision Making Pt her from home with AMS.  Spouse witnessed two brief episodes of syncope.  No hx of seizures.  May have struck her head while in the bathroom.  Patient reports having similar symptoms previously associated with UTI.  Ankle pain, but otherwise denies any symptoms. No report illness recently fever or chills.    Pt is somnolent here she does wake briefly to give simple answers.  Afebrile.  No tachycardia or tachypnea  Intracranial hemorrhage, subdural hematoma, sepsis, UTI, cardiac process, encephalopathy, all considered  Amount and/or Complexity of Data Reviewed Labs: ordered.    Details: Labs no evidence of leukocytosis, hemoglobin near baseline.  Chemistries show mildly hyperglycemia with reassuring bicarb and ion gap, ammonia level slightly elevated at 42.  Urinalysis shows large leukocytes greater than 50 white cells with cloudy urine.  Urine blood cultures pending, lactic acid pending Radiology: ordered.    Details: CT head no acute intracranial process.    Left ankle show avulsion fractures   CXR no acute cardiopulmonary process ECG/medicine tests: ordered.    Details: EKG shows sinus tachycardia with borderline left axis deviation and prolonged QT interval Discussion of management or test interpretation with external provider(s): Patient here with altered mental status.  Does not appear septic on arrival, lactic acid ordered, results are pending due to laboratory  issues with water  supply.  No leukocytosis  Patient also seen by Dr. Scarlette Currier  Urine is likely cause of her altered mental status, Rocephin  given here, she has received IV fluids.  CT head  reassuring.  Consulted [facility] hospitalist Dr. Quintella Buck who agrees to admit.   Risk Decision regarding hospitalization.           Final Clinical Impression(s) / ED Diagnoses Final diagnoses:  Altered mental status, unspecified altered mental status type    Rx / DC Orders ED Discharge Orders     None         Mitzie Anda 08/11/23 1924    Sueellen Emery, MD 08/11/23 2255

## 2023-08-11 NOTE — H&P (Addendum)
 History and Physical    [name]:[id number] DOB: 1965-09-10 DOA: 08/11/2023  PCP: Kathyleen Parkins, MD   Patient coming from: Home  I have personally briefly reviewed patient's old medical records in [facility]  Chief Complaint: AMS  HPI: [name] is a 58 y.o. female with medical history significant for diabetes mellitus, hypertension, sleep apnea, multiple sclerosis, chronic pain, depression. Patient was brought to the ED reports of altered mental status.  At time of my evaluation, patient is intermittently somnolent, she would wake up suddenly when aroused and attempt to answer questions, and suddenly fall asleep.  She answers some questions appropriately, but responses are delayed, and speech clear but a bit confused.  She reports some pain with urination over the past few days.  No vomiting no diarrhea no abdominal pain. Patient was brought to the ED by spouse who is not present on my evaluation.  Reports that patient had generalized twitching, and then almost passed out in the bedroom.  She got up, and then fell in the bathroom, and struck her head on the toilet.  She did not totally lose consciousness.  Spouse denies seizure activity or hx of same.  Reports similar occurrence with past UTI infections.  ED Course: Temperature 98.2.  Heart rate 88-113.  Respiratory 10-20.  Blood pressure systolic 160 156.  O2 sats greater than 90% on room air. UA with positive leukocytes.   Left ankle x-ray shows avulsion fracture of the tip of the fibula malleolus with lateral soft tissue swelling.  Head and cervical CT negative for acute abnormality.  Chest x-ray unremarkable. Urine cultures, blood cultures and lactic acid ordered. Brace ordered for left ankle.  IV ceftriaxone  2 g given.  1.5 L bolus given.  Review of Systems: As per HPI all other systems reviewed and negative.  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 Lucina Sabal at [facility]   Non-adherence to medical treatment 05/14/2009   Qualifier: Diagnosis of   By: Silvestre Drum, [place]        RECTAL BLEEDING 07/05/2009   Qualifier: Diagnosis of   By: Kenney Peacemaker       Sleep apnea    Spondylosis of thoracic region without myelopathy or radiculopathy 07/02/2014   Uncontrolled type 2 diabetes mellitus with hyperglycemia (HCC) 03/15/2017    Past Surgical History:  Procedure Laterality Date  ABDOMINAL HYSTERECTOMY     APPENDECTOMY     BACK SURGERY  03/07/2012   Nerve Stimulator   BIOPSY  04/23/2022   Procedure: BIOPSY;  Surgeon: Urban Garden, MD;  [facility];  Service: Gastroenterology;;   CHOLECYSTECTOMY     COLONOSCOPY  07/09/2009   RMR; normal rectum aside from anal canal hemorrhoids/scattered left-sided diverticula   COLONOSCOPY WITH PROPOFOL  N/A 04/23/2022   Procedure: COLONOSCOPY WITH PROPOFOL ;  Surgeon: Urban Garden, MD;  [facility];  Service: Gastroenterology;  Laterality: N/A;    ESOPHAGOGASTRODUODENOSCOPY  05/22/2009   RMR; normal /small HH   ESOPHAGOGASTRODUODENOSCOPY  2007   RMR: non-critical Schatzki's rin, non-maniuplated   ESOPHAGOGASTRODUODENOSCOPY N/A 10/24/2013   Procedure: ESOPHAGOGASTRODUODENOSCOPY (EGD);  Surgeon: Suzette Espy, MD;  [facility];  Service: Endoscopy;  Laterality: N/A;  9:45   ESOPHAGOGASTRODUODENOSCOPY N/A 04/23/2022   Procedure: ESOPHAGOGASTRODUODENOSCOPY (EGD);  Surgeon: Umberto Ganong, Bearl Limes, MD;  [facility];  Service: Gastroenterology;  Laterality: N/A;   ESOPHAGOGASTRODUODENOSCOPY (EGD) WITH ESOPHAGEAL DILATION  03/17/2012   ZOX:[id number] appearing esophageal mucosa of uncertain significance. Status post Londa Rival dilation followed by esophageal bx   ESOPHAGOGASTRODUODENOSCOPY (EGD) WITH PROPOFOL  N/A 07/24/2022   Procedure: ESOPHAGOGASTRODUODENOSCOPY (EGD) WITH PROPOFOL ;  Surgeon: Suzette Espy, MD;  [facility];  Service: Endoscopy;  Laterality: N/A;  7:30 am   MALONEY DILATION N/A 10/24/2013   Procedure: Londa Rival DILATION;  Surgeon: Suzette Espy, MD;  [facility];  Service: Endoscopy;  Laterality: N/A;   MANDIBLE SURGERY     NECK SURGERY     X2, last time 2013     reports that she has never smoked. She has never used smokeless tobacco. She reports that she does not currently use alcohol . She reports that she does not use drugs.  Allergies  Allergen Reactions   Peanut-Containing Drug Products Anaphylaxis and Hives    Patient states she is allergic to all nuts   Shellfish Allergy Anaphylaxis   Gluten Meal Other (See Comments)    Unknown    Latex Other (See Comments)    Blistering   Prednisone Hives and Other (See Comments)    Causes psychological issues    Nexium [Esomeprazole Magnesium] Hives   Tape Rash    Paper tape is ok to use    Family History  Problem Relation Age of Onset   Cancer Mother        unknown type   Diabetes Father    Diabetes Sister    Colon cancer  Neg Hx    Prior to Admission medications   Medication Sig Start Date End Date Taking? Authorizing Provider  ACCU-CHEK GUIDE test strip USE TO CHECK BLOOD SUGAR 4 TIMES DAILY AS DIRECTED 03/06/22   Nida, Gebreselassie W, MD  Accu-Chek Softclix Lancets lancets USE TO CHECK BLOOD SUGAR 4 TIMES DAILY AS DIRECTED 03/06/22   Nida, Gebreselassie W, MD  acetaminophen  (TYLENOL ) 500 MG tablet Take 1,000 mg by mouth every 6 (six) hours as needed for moderate pain.    [provider]  albuterol  (PROVENTIL ) (2.5 MG/3ML) 0.083% nebulizer solution Take 2.5 mg by nebulization every 4 (four) hours as needed for wheezing or shortness of breath. 07/30/23   [provider]  albuterol  (VENTOLIN  HFA) 108 (90 Base) MCG/ACT inhaler Inhale 1-2 puffs into the lungs every 6 (six) hours as needed for wheezing or shortness of breath.  01/02/19   [provider]  baclofen  (LIORESAL ) 20 MG tablet Take  20 mg by mouth 3 (three) times daily.  05/08/14   [provider]  blood glucose meter kit and supplies KIT Dispense based on patient and insurance preference. Use up to four times daily as directed. (FOR ICD-9 250.00, 250.01). 03/15/19   Enrigue Harvard, DO  Blood Glucose Monitoring Suppl (ACCU-CHEK GUIDE ME) w/Device KIT USE TO CHECK BLOOD SUGAR DAILY AS DIRECTED 12/31/21   Nida, Gebreselassie W, MD  busPIRone (BUSPAR) 10 MG tablet Take 10 mg by mouth daily as needed. 07/12/23   [provider]  citalopram  (CELEXA ) 40 MG tablet Take 40 mg by mouth every evening.     [provider]  clopidogrel  (PLAVIX ) 75 MG tablet Take 75 mg by mouth daily. 11/27/21   [provider]  Continuous Blood Gluc Receiver (DEXCOM G7 RECEIVER) DEVI Use to check glucose as directed 10/28/21   Nida, Gebreselassie W, MD  Continuous Blood Gluc Sensor (DEXCOM G7 SENSOR) MISC Change sensor every 10 days 01/19/22   Nida, Gebreselassie W, MD  estradiol  (ESTRACE ) 0.1 MG/GM vaginal cream Discard plastic  applicator. Insert a blueberry size amount (approximately 1 gram) of cream on fingertip inside vagina at bedtime every night for 1 week then every other night. For long term use. 05/12/23   Lauretta Ponto, FNP  gabapentin  (NEURONTIN ) 300 MG capsule Take 4-5 capsules by mouth daily. 07/12/23   [provider]  glipiZIDE  (GLUCOTROL ) 10 MG tablet Take 10 mg by mouth daily before breakfast. 07/12/23   [provider]  insulin  isophane & regular human KwikPen (NOVOLIN  70/30 KWIKPEN) (70-30) 100 UNIT/ML KwikPen Inject 10 Units into the skin 2 (two) times daily before a meal. Patient taking differently: Inject 60 Units into the skin 2 (two) times daily before a meal. 12/02/21   Gwendalyn Lemma, MD  Insulin  Pen Needle (B-D ULTRAFINE III SHORT PEN) 31G X 8 MM MISC USE TO INJECT INSULIN  2 TIMES DAILY 06/19/22   Nida, Gebreselassie W, MD  lisinopril  (ZESTRIL ) 10 MG tablet Take 10 mg by mouth daily. 07/29/23   [provider]  metFORMIN  (GLUCOPHAGE ) 500 MG tablet Take 1,000 mg by mouth 2 (two) times daily. 07/12/23   [provider]  metoprolol  succinate (TOPROL -XL) 50 MG 24 hr tablet Take 1 tablet (50 mg total) by mouth daily. Take with or immediately following a meal. 04/25/22 04/20/23  Colin Dawley, MD  nitrofurantoin, macrocrystal-monohydrate, (MACROBID) 100 MG capsule Take 100 mg by mouth daily. 11/20/22   [provider]  ondansetron  (ZOFRAN ) 4 MG tablet Take 1 tablet (4 mg total) by mouth 4 (four) times daily as needed for nausea or vomiting. 04/25/22   Colin Dawley, MD  oxycodone  (ROXICODONE ) 30 MG immediate release tablet Take 15 mg by mouth See admin instructions. Take 15 mg (1/2 tablet) by mouth in the morning and mid afternoon. Take an additional 15 mg (1/2 tablet) as needed. 10/31/21   [provider]  pantoprazole  (PROTONIX ) 40 MG tablet Take 1 tablet (40 mg total) by mouth 2 (two) times daily. 11/17/22 11/17/23  Eustacio Highman, NP  pravastatin   (PRAVACHOL ) 40 MG tablet Take 1 tablet (40 mg total) by mouth every evening. 03/15/19   Black, Lyna Sandhoff, NP  rOPINIRole  (REQUIP ) 0.5 MG tablet Take 0.5 mg by mouth at bedtime. 11/27/21   [provider]  Vibegron  (GEMTESA ) 75 MG TABS Take 1 tablet (75 mg total) by mouth daily. 05/12/23   Lauretta Ponto, FNP  Vibegron  (GEMTESA ) 75 MG TABS Take 1 tablet (75  mg total) by mouth daily. 05/17/23   Lauretta Ponto, FNP  Vitamin D , Ergocalciferol , (DRISDOL) 1.25 MG (50000 UNIT) CAPS capsule Take 50,000 Units by mouth once a week. 07/28/23   [provider]    Physical Exam: Vitals:   08/11/23 1348 08/11/23 1500 08/11/23 1730 08/11/23 1845  BP:  (!) 101/51 (!) 106/57   Pulse:  93 94 88  Resp:  11 12 10   Temp:      TempSrc:      SpO2:  90% 94% 96%  Weight: 88.5 kg     Height: 5\' 5"  (1.651 m)       Constitutional: Intermittently somnolent Vitals:   08/11/23 1348 08/11/23 1500 08/11/23 1730 08/11/23 1845  BP:  (!) 101/51 (!) 106/57   Pulse:  93 94 88  Resp:  11 12 10   Temp:      TempSrc:      SpO2:  90% 94% 96%  Weight: 88.5 kg     Height: 5\' 5"  (1.651 m)      Eyes: PERRL, lids and conjunctivae normal ENMT: Mucous membranes are very dry.  Neck: normal, supple, no masses, no thyromegaly Respiratory: clear to auscultation bilaterally, no wheezing, no crackles. Normal respiratory effort. No accessory muscle use.  Cardiovascular: Regular rate and rhythm, no murmurs / rubs / gallops. No extremity edema.  Extremities warm Abdomen: no tenderness, no masses palpated. No hepatosplenomegaly. Bowel sounds positive.  Musculoskeletal: no clubbing / cyanosis. No joint deformity upper and lower extremities. Skin: no rashes, lesions, ulcers. No induration Neurologic: No facial asymmetry, moving extremities spontaneously, speech-delayed responses, but clear, a bit confused.  Psychiatric: Intermittently somnolent, awakens suddenly aroused, and suddenly falls asleep while attempting to  answer questions  Labs on Admission: I have personally reviewed following labs and imaging studies  CBC: Recent Labs  Lab 08/11/23 1515 08/11/23 1517  WBC 4.8  --   NEUTROABS 4.0  --   HGB 10.3* 10.9*  HCT 32.8* 32.0*  MCV 85.2  --   PLT 141*  --    Basic Metabolic Panel: Recent Labs  Lab 08/11/23 1515 08/11/23 1517  NA 138 139  K 4.8 4.9  CL 106 105  CO2 25  --   GLUCOSE 195* 194*  BUN 14 16  CREATININE 1.04* 1.00  CALCIUM 8.6*  --    GFR: Estimated Creatinine Clearance: 67.4 mL/min (by C-G formula based on SCr of 1 mg/dL). Liver Function Tests: Recent Labs  Lab 08/11/23 1515  AST 38  ALT 22  ALKPHOS 117  BILITOT 0.6  PROT 7.7  ALBUMIN 2.7*   No results for input(s): "LIPASE", "AMYLASE" in the last 168 hours. Recent Labs  Lab 08/11/23 1515  AMMONIA 42*   CBG: Recent Labs  Lab 08/11/23 1508 08/11/23 1626  GLUCAP 204* 191*    Urine analysis:    Component Value Date/Time   COLORURINE YELLOW 08/11/2023 1717   APPEARANCEUR CLOUDY (A) 08/11/2023 1717   LABSPEC 1.010 08/11/2023 1717   PHURINE 6.0 08/11/2023 1717   GLUCOSEU NEGATIVE 08/11/2023 1717   HGBUR NEGATIVE 08/11/2023 1717   BILIRUBINUR NEGATIVE 08/11/2023 1717   KETONESUR NEGATIVE 08/11/2023 1717   PROTEINUR NEGATIVE 08/11/2023 1717   UROBILINOGEN 1.0 06/26/2014 1720   NITRITE NEGATIVE 08/11/2023 1717   LEUKOCYTESUR LARGE (A) 08/11/2023 1717    Radiological Exams on Admission: CT Cervical Spine Wo Contrast Result Date: 08/11/2023 CLINICAL DATA:  Syncope, twitching, fell EXAM: CT CERVICAL SPINE WITHOUT CONTRAST TECHNIQUE: Multidetector CT imaging of the cervical  spine was performed without intravenous contrast. Multiplanar CT image reconstructions were also generated. RADIATION DOSE REDUCTION: This exam was performed according to the departmental dose-optimization program which includes automated exposure control, adjustment of the mA and/or kV according to patient size and/or use of  iterative reconstruction technique. COMPARISON:  04/06/2020 FINDINGS: Alignment: Stable mild degenerative anterolisthesis of C3 on C4. Loss of cervical lordosis due to prior C4-C7 ACDF. Skull base and vertebrae: No acute fracture. No primary bone lesion or focal pathologic process. Soft tissues and spinal canal: No prevertebral fluid or swelling. No visible canal hematoma. Disc levels: Prior C4-C7 ACDF. There is moderate spondylosis at C3-4 with mild degenerative anterolisthesis as described above. Multilevel facet hypertrophy most pronounced at C2-3 and C3-4. Upper chest: Airway is patent. Visualized portions of the lung apices are clear. Other: Reconstructed images demonstrate no additional findings. IMPRESSION: 1. No acute cervical spine fracture. 2. Stable multilevel spondylosis and facet hypertrophy greatest at C3-4. 3. Stable C4-C7 ACDF. Electronically Signed   By: Bobbye Burrow M.D.   On: 08/11/2023 15:53   CT Head Wo Contrast Result Date: 08/11/2023 CLINICAL DATA:  Syncope, twitching, altered level of consciousness EXAM: CT HEAD WITHOUT CONTRAST TECHNIQUE: Contiguous axial images were obtained from the base of the skull through the vertex without intravenous contrast. RADIATION DOSE REDUCTION: This exam was performed according to the departmental dose-optimization program which includes automated exposure control, adjustment of the mA and/or kV according to patient size and/or use of iterative reconstruction technique. COMPARISON:  11/28/2021 FINDINGS: Brain: No acute infarct or hemorrhage. Lateral ventricles and midline structures are unremarkable. No acute extra-axial fluid collections. No mass effect. Vascular: No hyperdense vessel or unexpected calcification. Skull: Normal. Negative for fracture or focal lesion. Sinuses/Orbits: No acute finding. Other: None. IMPRESSION: 1. No acute intracranial process. Electronically Signed   By: Bobbye Burrow M.D.   On: 08/11/2023 15:46   DG Ankle Complete  Left Result Date: 08/11/2023 CLINICAL DATA:  Fall EXAM: LEFT ANKLE COMPLETE - 3+ VIEW COMPARISON:  None Available. FINDINGS: No malalignment. Acute avulsion type fracture injuries at the tip of the fibular malleolus. Lateral soft tissue swelling. Ankle mortise is symmetric. Moderate plantar calcaneal spur IMPRESSION: Acute avulsion type fracture fragments at the tip of the fibular malleolus with lateral soft tissue swelling. Electronically Signed   By: Esmeralda Hedge M.D.   On: 08/11/2023 15:15   DG Chest Port 1 View Result Date: 08/11/2023 CLINICAL DATA:  syncope EXAM: PORTABLE CHEST - 1 VIEW COMPARISON:  December 18, 2022 FINDINGS: Low lung volumes. No focal airspace consolidation, pleural effusion, or pneumothorax. Borderline cardiomegaly. No acute fracture or destructive lesion. Cervical fusion hardware. Spinal stimulator leads. IMPRESSION: Low lung volumes.  No acute cardiopulmonary abnormality. Electronically Signed   By: Rance Burrows M.D.   On: 08/11/2023 15:04   EKG: Independently reviewed.  Sinus rhythm, rate 105, QTc prolonged at 513.  No significant change from prior.  Assessment/Plan Principal Problem:   Acute metabolic encephalopathy Active Problems:   UTI (urinary tract infection)   Fall at home, initial encounter   Multiple sclerosis (HCC)   Essential hypertension, benign   DM type 2 causing vascular disease (HCC)   Back pain, chronic   CVA (cerebral vascular accident) (HCC)   Assessment and Plan: * Acute metabolic encephalopathy Presenting with somnolence, falls, twitching, dysuria, delayed response to questions but speech clear. Head and Cx Ct without acute abnormality. Chest xray - clear. UA suggestive of UTI- with + Leukocytes, she also appears dehydrated. -  IV fluids, IV ceftriazone - Hold psychoactive meds- Buspar, celexa , gabapentin , ropinirole  - NPO till inprovement in mental status - Delayed speech response likely metabolic etiology, if persistent, may need to get  further brain imagine.   Fall at home, initial encounter Left ankle xray- Acute avulsion type fracture fragments at the tip of the fibular malleolus with lateral soft tissue swelling.  UTI (urinary tract infection) Symptomatic with dysuria. Presenting with encephalopathy. Rules out for sepsis. Last urine culture on file- 06/2022- pansensitive Ecoli.  - Continue Ceftriaxone  - Follow up blood and urine cultures drawn in ED.  CVA (cerebral vascular accident) (HCC) History of CVA. -Resume Plavix  when mental status has improved  Back pain, chronic On oxycodone  IR 30mg  BID - Hold till improvement in mental status  DM type 2 causing vascular disease (HCC) _ Hgba1c - SSI- M - Hold glipizide , metformin , home 7030 60 units twice daily  Essential hypertension, benign Stable - Resume lisinopril , metoprolol   Multiple sclerosis (HCC) Resume Baclofen   Prolonged Qt- 513. K- 4.8.  - Check Mag  DVT prophylaxis: Lovenox  Code Status: Full Family Communication: none at bedside Disposition Plan: ~ 2 days Consults called: None Admission status:  Inpt Tele I certify that at the point of admission it is my clinical judgment that the patient will require inpatient hospital care spanning beyond 2 midnights from the point of admission due to high intensity of service, high risk for further deterioration and high frequency of surveillance required.   Author: Pati Bonine, MD 08/11/2023 8:36 PM  For on call review [email or web address].

## 2023-08-11 NOTE — Assessment & Plan Note (Signed)
 Symptomatic with dysuria. Presenting with encephalopathy. Rules out for sepsis. Last urine culture on file- 06/2022- pansensitive Ecoli.  - Continue Ceftriaxone  - Follow up blood and urine cultures drawn in ED.

## 2023-08-11 NOTE — ED Triage Notes (Signed)
 Pt husband reports pt has been twitching and passing out today.  Husband says this has happened before but not sure why.

## 2023-08-11 NOTE — Assessment & Plan Note (Signed)
 History of CVA. -Resume Plavix  when mental status has improved

## 2023-08-11 NOTE — Progress Notes (Signed)
   08/11/23 2201  TOC Brief Assessment  Insurance and Status Reviewed  Patient has primary care physician Yes  Home environment has been reviewed From home  Prior level of function: Independent  Prior/Current Home Services No current home services  Social Drivers of Health Review SDOH reviewed no interventions necessary  Transition of care needs no transition of care needs at this time   Transition of Care Department [facility]) has reviewed patient and no other TOC needs have been identified at this time. We will continue to monitor patient advancement through interdisciplinary progression rounds. If new patient needs arise, please place a TOC consult.

## 2023-08-11 NOTE — Assessment & Plan Note (Addendum)
 On oxycodone  IR 30mg  BID - Hold till improvement in mental status

## 2023-08-11 NOTE — Assessment & Plan Note (Signed)
 Left ankle xray- Acute avulsion type fracture fragments at the tip of the fibular malleolus with lateral soft tissue swelling.

## 2023-08-12 DIAGNOSIS — G9341 Metabolic encephalopathy: Secondary | ICD-10-CM | POA: Diagnosis not present

## 2023-08-12 LAB — CBC
HCT: 30.8 % — ABNORMAL LOW (ref 36.0–46.0)
Hemoglobin: 9.8 g/dL — ABNORMAL LOW (ref 12.0–15.0)
MCH: 27.3 pg (ref 26.0–34.0)
MCHC: 31.8 g/dL (ref 30.0–36.0)
MCV: 85.8 fL (ref 80.0–100.0)
Platelets: 137 10*3/uL — ABNORMAL LOW (ref 150–400)
RBC: 3.59 MIL/uL — ABNORMAL LOW (ref 3.87–5.11)
RDW: 15.9 % — ABNORMAL HIGH (ref 11.5–15.5)
WBC: 5.7 10*3/uL (ref 4.0–10.5)
nRBC: 0 % (ref 0.0–0.2)

## 2023-08-12 LAB — BASIC METABOLIC PANEL WITH GFR
Anion gap: 9 (ref 5–15)
BUN: 10 mg/dL (ref 6–20)
CO2: 24 mmol/L (ref 22–32)
Calcium: 8.2 mg/dL — ABNORMAL LOW (ref 8.9–10.3)
Chloride: 106 mmol/L (ref 98–111)
Creatinine, Ser: 0.85 mg/dL (ref 0.44–1.00)
GFR, Estimated: >60 mL/min (ref >60)
Glucose, Bld: 187 mg/dL — ABNORMAL HIGH (ref 70–99)
Potassium: 3.8 mmol/L (ref 3.5–5.1)
Sodium: 139 mmol/L (ref 135–145)

## 2023-08-12 LAB — GLUCOSE, CAPILLARY
Glucose-Capillary: 140 mg/dL — ABNORMAL HIGH (ref 70–99)
Glucose-Capillary: 155 mg/dL — ABNORMAL HIGH (ref 70–99)
Glucose-Capillary: 108 mg/dL — ABNORMAL HIGH (ref 70–99)
Glucose-Capillary: 132 mg/dL — ABNORMAL HIGH (ref 70–99)

## 2023-08-12 LAB — MRSA NEXT GEN BY PCR, NASAL: MRSA by PCR Next Gen: NOT DETECTED

## 2023-08-12 MED ORDER — OXYCODONE HCL 5 MG PO TABS
15.0000 mg | ORAL_TABLET | Freq: Two times a day (BID) | ORAL | Status: DC
  Administered 2023-08-12 – 2023-08-14 (×4): 15 mg via ORAL
  Filled 2023-08-12 (×4): qty 3

## 2023-08-12 MED ORDER — GABAPENTIN 400 MG PO CAPS: 1200.0000 mg | ORAL_CAPSULE | Freq: Every day | ORAL | Status: DC

## 2023-08-12 MED ORDER — PRAVASTATIN SODIUM 40 MG PO TABS
40.0000 mg | ORAL_TABLET | Freq: Every evening | ORAL | Status: DC
  Administered 2023-08-12 – 2023-08-13 (×2): 40 mg via ORAL
  Filled 2023-08-12 (×2): qty 1

## 2023-08-12 MED ORDER — ENOXAPARIN SODIUM 40 MG/0.4ML IJ SOSY
40.0000 mg | PREFILLED_SYRINGE | INTRAMUSCULAR | Status: DC
  Administered 2023-08-12 – 2023-08-13 (×2): 40 mg via SUBCUTANEOUS
  Filled 2023-08-12 (×2): qty 0.4

## 2023-08-12 MED ORDER — INSULIN ASPART 100 UNIT/ML IJ SOLN
0.0000 [IU] | Freq: Three times a day (TID) | INTRAMUSCULAR | Status: DC
  Administered 2023-08-12 – 2023-08-13 (×3): 2 [IU] via SUBCUTANEOUS
  Administered 2023-08-13 – 2023-08-14 (×2): 1 [IU] via SUBCUTANEOUS

## 2023-08-12 MED ORDER — CITALOPRAM HYDROBROMIDE 20 MG PO TABS
40.0000 mg | ORAL_TABLET | Freq: Every evening | ORAL | Status: DC
  Administered 2023-08-12 – 2023-08-13 (×2): 40 mg via ORAL
  Filled 2023-08-12 (×2): qty 2

## 2023-08-12 MED ORDER — CLOPIDOGREL BISULFATE 75 MG PO TABS
75.0000 mg | ORAL_TABLET | Freq: Every day | ORAL | Status: DC
  Administered 2023-08-12 – 2023-08-14 (×3): 75 mg via ORAL
  Filled 2023-08-12 (×3): qty 1

## 2023-08-12 MED ORDER — GABAPENTIN 400 MG PO CAPS
400.0000 mg | ORAL_CAPSULE | Freq: Three times a day (TID) | ORAL | Status: DC
  Administered 2023-08-12 – 2023-08-14 (×6): 400 mg via ORAL
  Filled 2023-08-12 (×6): qty 1

## 2023-08-12 NOTE — Plan of Care (Signed)

## 2023-08-12 NOTE — TOC Progression Note (Signed)
 Transition of Care [facility]) - Progression Note    Patient Details  Name: VENETTA PERKETT MRN: [id number] Date of Birth: 19-May-1965  Transition of Care [facility]) CM/SW Contact  Grandville Lax, [place] Phone Number: 08/12/2023, 3:41 PM  Clinical Narrative:    CSW updated that PT is recommending a 3N1, rolling walker and HH PT for pt. CSW spoke with pt about this and she would like CSW to order DME, no agency preference. CSW provided DME referral to Zack with [employer]. CSW spoke with pt about insurance having high copay for [facility] PT visit. Pt states that she prefers to do OP PT. CSW to make referral. TOC to follow.     Barriers to Discharge: Continued Medical Work up  Expected Discharge Plan and Services                                               Social Determinants of Health (SDOH) Interventions SDOH Screenings   Food Insecurity: No Food Insecurity (08/11/2023)  Housing: High Risk (08/11/2023)  Transportation Needs: No Transportation Needs (08/11/2023)  Utilities: Not At Risk (08/11/2023)  Depression (PHQ2-9): Low Risk  (04/07/2018)  Tobacco Use: Low Risk  (08/11/2023)    Readmission Risk Interventions    08/11/2023   10:00 PM  Readmission Risk Prevention Plan  Post Dischage Appt Complete  Medication Screening Complete  Transportation Screening Complete

## 2023-08-12 NOTE — Progress Notes (Signed)
 Mobility Specialist Progress Note:    08/12/23 1436  Mobility  Activity Ambulated with assistance in room;Ambulated with assistance to bathroom  Level of Assistance Standby assist, set-up cues, supervision of patient - no hands on  Assistive Device None  Distance Ambulated (ft) 40 ft  Range of Motion/Exercises Active;All extremities  Activity Response Tolerated well  Mobility Referral Yes  Mobility visit 1 Mobility  Mobility Specialist Start Time (ACUTE ONLY) 1410  Mobility Specialist Stop Time (ACUTE ONLY) 1430  Mobility Specialist Time Calculation (min) (ACUTE ONLY) 20 min   Pt received in bed requesting assistance to bathroom, husband in room. Required SBA to stand and ambulate with no AD, pt uses walls for support. Tolerated well, asx throughout. Returned supine, alarm on. All needs met.  Glinda Lapping Mobility Specialist Please contact via Special educational needs teacher or  Rehab office at [phone]

## 2023-08-12 NOTE — Progress Notes (Signed)
 The beneficiary is confined to a single room and will need a 3N1 to safely return home at time of hospital discharge.

## 2023-08-12 NOTE — Progress Notes (Signed)
   08/12/23 0215  Assess: MEWS Score  Temp 99.5 F (37.5 C)  BP (!) 151/63  MAP (mmHg) 87  Pulse Rate (!) 113  Resp 18  SpO2 94 %  O2 Device Room Air  Assess: MEWS Score  MEWS Temp 0  MEWS Systolic 0  MEWS Pulse 2  MEWS RR 0  MEWS LOC 0  MEWS Score 2  MEWS Score Color Yellow  Assess: if the MEWS score is Yellow or Red  Were vital signs accurate and taken at a resting state? Yes  MEWS guidelines implemented  Yes, yellow  Treat  MEWS Interventions Considered administering scheduled or prn medications/treatments as ordered  Take Vital Signs  Increase Vital Sign Frequency  Yellow: Q2hr x1, continue Q4hrs until patient remains green for 12hrs  Escalate  MEWS: Escalate Yellow: Discuss with charge nurse and consider notifying provider and/or RRT  Notify: Charge Nurse/RN  Name of Charge Nurse/RN Notified Margretta Shi B  Assess: SIRS CRITERIA  SIRS Temperature  0  SIRS Respirations  0  SIRS Pulse 1  SIRS WBC 0  SIRS Score Sum  1

## 2023-08-12 NOTE — Evaluation (Signed)
 Physical Therapy Evaluation Patient Details Name: [name] MRN: [id number] DOB: 12-09-1965 Today's Date: 08/12/2023  History of Present Illness  [name] is a 58 y.o. female with medical history significant for diabetes mellitus, hypertension, sleep apnea, multiple sclerosis, chronic pain, depression.  Patient was brought to the ED reports of altered mental status.  At time of my evaluation, patient is intermittently somnolent, she would wake up suddenly when aroused and attempt to answer questions, and suddenly fall asleep.  She answers some questions appropriately, but responses are delayed, and speech clear but a bit confused.  She reports some pain with urination over the past few days.  No vomiting no diarrhea no abdominal pain.  Patient was brought to the ED by spouse who is not present on my evaluation.  Reports that patient had generalized twitching, and then almost passed out in the bedroom.  She got up, and then fell in the bathroom, and struck her head on the toilet.  She did not totally lose consciousness.  Spouse denies seizure activity or hx of same.  Reports similar occurrence with past UTI infections.   Clinical Impression  Patient functioning near baseline for functional mobility and gait other mild antalgic gait favoring LLE due to ankle discomfort/pain, no loss of balance and able to ambulate household distances without use of an AD, but will require use of RW for longer distances or when left ankle pain is increasing. Plan:  Patient discharged from physical therapy to care of nursing for ambulation daily as tolerated for length of stay.          If plan is discharge home, recommend the following: Help with stairs or ramp for entrance;A little help with walking and/or transfers;Assistance with cooking/housework   Can travel by private vehicle        Equipment Recommendations Rolling walker (2 wheels);BSC/3in1  Recommendations for Other Services       Functional Status  Assessment Patient has had a recent decline in their functional status and demonstrates the ability to make significant improvements in function in a reasonable and predictable amount of time.     Precautions / Restrictions Precautions Precautions: Fall Restrictions Weight Bearing Restrictions Per Provider Order: No      Mobility  Bed Mobility Overal bed mobility: Modified Independent             General bed mobility comments: HOB slightly raised, used bed rail    Transfers Overall transfer level: Modified independent                      Ambulation/Gait Ambulation/Gait assistance: Modified independent (Device/Increase time) Gait Distance (Feet): 80 Feet Assistive device: None Gait Pattern/deviations: Decreased step length - right, Decreased step length - left, Decreased stance time - left, Decreased dorsiflexion - left, Antalgic Gait velocity: decreased     General Gait Details: good return for ambulating in room, hallway without loss of balance with limited weightbearing on LLE due to pain  Stairs            Wheelchair Mobility     Tilt Bed    Modified Rankin (Stroke Patients Only)       Balance Overall balance assessment: Mild deficits observed, not formally tested                                           Pertinent  Vitals/Pain Pain Assessment Pain Assessment: 0-10 Pain Score: 7  Pain Location: left ankle Pain Descriptors / Indicators: Sore, Discomfort Pain Intervention(s): Limited activity within patient's tolerance, Monitored during session, Premedicated before session, Repositioned    Home Living Family/patient expects to be discharged to:: Private residence Living Arrangements: Spouse/significant other Available Help at Discharge: Family;Available 24 hours/day Type of Home: House Home Access: Stairs to enter Entrance Stairs-Rails: None Entrance Stairs-Number of Steps: 1   Home Layout: One level Home  Equipment: Cane - single point;Grab bars - tub/shower      Prior Function Prior Level of Function : Independent/Modified Independent;Driving             Mobility Comments: Community ambulation without AD, drives ADLs Comments: Independent     Extremity/Trunk Assessment   Upper Extremity Assessment Upper Extremity Assessment: Overall WFL for tasks assessed    Lower Extremity Assessment Lower Extremity Assessment: Overall WFL for tasks assessed;LLE deficits/detail LLE Deficits / Details: grossly WFL except left ankle -4/5 LLE: Unable to fully assess due to immobilization;Unable to fully assess due to pain LLE Sensation: WNL LLE Coordination: WNL    Cervical / Trunk Assessment Cervical / Trunk Assessment: Normal  Communication   Communication Communication: No apparent difficulties    Cognition Arousal: Alert Behavior During Therapy: WFL for tasks assessed/performed   PT - Cognitive impairments: No apparent impairments                         Following commands: Intact       Cueing Cueing Techniques: Verbal cues     General Comments      Exercises     Assessment/Plan    PT Assessment All further PT needs can be met in the next venue of care  PT Problem List Decreased balance;Decreased activity tolerance;Decreased mobility;Pain;Decreased strength       PT Treatment Interventions      PT Goals (Current goals can be found in the Care Plan section)  Acute Rehab PT Goals Patient Stated Goal: return home with family to assist PT Goal Formulation: With patient/family Time For Goal Achievement: 08/12/23 Potential to Achieve Goals: Good    Frequency       Co-evaluation               AM-PAC PT "6 Clicks" Mobility  Outcome Measure Help needed turning from your back to your side while in a flat bed without using bedrails?: None Help needed moving from lying on your back to sitting on the side of a flat bed without using bedrails?:  None Help needed moving to and from a bed to a chair (including a wheelchair)?: None Help needed standing up from a chair using your arms (e.g., wheelchair or bedside chair)?: None Help needed to walk in hospital room?: A Little Help needed climbing 3-5 steps with a railing? : A Little 6 Click Score: 22    End of Session   Activity Tolerance: Patient tolerated treatment well;Patient limited by fatigue Patient left: in bed;with call bell/phone within reach;with family/visitor present Nurse Communication: Mobility status PT Visit Diagnosis: Unsteadiness on feet (R26.81);Other abnormalities of gait and mobility (R26.89);Muscle weakness (generalized) (M62.81)    Time: 2841-3244 PT Time Calculation (min) (ACUTE ONLY): 21 min   Charges:   PT Evaluation $PT Eval Moderate Complexity: 1 Mod PT Treatments $Therapeutic Activity: 8-22 mins PT General Charges $$ ACUTE PT VISIT: 1 Visit         3:32 PM, 08/12/23 Royston Cornea  Candee Cha, MPT Physical Therapist with [facility] [phone] office [phone] mobile phone

## 2023-08-12 NOTE — Progress Notes (Signed)
 PROGRESS NOTE    [name]  ZOX:[id number] DOB: [birth year] DOA: 08/11/2023 PCP: Kathyleen Parkins, MD   Brief Narrative:    [name] is a 58 y.o. female with medical history significant for diabetes mellitus, hypertension, sleep apnea, multiple sclerosis, chronic pain, depression. Patient was brought to the ED reports of altered mental status.  Patient was admitted with acute metabolic encephalopathy secondary to UTI.  She is more awake and alert today and has urine cultures currently pending.  Assessment & Plan:   Principal Problem:   Acute metabolic encephalopathy Active Problems:   UTI (urinary tract infection)   Fall at home, initial encounter   Multiple sclerosis (HCC)   Essential hypertension, benign   DM type 2 causing vascular disease (HCC)   Back pain, chronic   CVA (cerebral vascular accident) (HCC)  Assessment and Plan:  Acute metabolic encephalopathy-improved Presenting with somnolence, falls, twitching, dysuria, delayed response to questions but speech clear. Head and Cx Ct without acute abnormality. Chest xray - clear. UA suggestive of UTI- with + Leukocytes, she also appears dehydrated. - Continue IV Rocephin  and hold further IV fluids - Resume home medications - Diet has been resumed   Fall at home, initial encounter Left ankle xray- Acute avulsion type fracture fragments at the tip of the fibular malleolus with lateral soft tissue swelling. PT evaluation   UTI (urinary tract infection) Symptomatic with dysuria. Presenting with encephalopathy. Rules out for sepsis. Last urine culture on file- 06/2022- pansensitive Ecoli.  - Continue Ceftriaxone  - Follow up blood and urine cultures drawn in ED.   CVA (cerebral vascular accident) (HCC) History of CVA. -Resume Plavix  when mental status has improved   Back pain, chronic On oxycodone  IR 30mg  BID - Hold till improvement in mental status   DM type 2 causing vascular disease (HCC) _ Hgba1c 9.3% - SSI-  M - Resume home medications   Essential hypertension, benign Stable - Resume lisinopril , metoprolol    Multiple sclerosis (HCC) Resume Baclofen  PT evaluation   Prolonged Qt- 513. K- 4.8.  - Check Mag  Obesity, class I - BMI 34.16  DVT prophylaxis:Lovenox  Code Status: Full Family Communication: None at bedside Disposition Plan:  Status is: Inpatient Remains inpatient appropriate because: Need for IV medications.   Consultants:  None  Procedures:  None  Antimicrobials:  Anti-infectives (From admission, onward)    Start     Dose/Rate Route Frequency Ordered Stop   08/12/23 1800  cefTRIAXone  (ROCEPHIN ) 2 g in sodium chloride  0.9 % 100 mL IVPB        2 g 200 mL/hr over 30 Minutes Intravenous Every 24 hours 08/11/23 2045     08/11/23 1830  cefTRIAXone  (ROCEPHIN ) 2 g in sodium chloride  0.9 % 100 mL IVPB        2 g 200 mL/hr over 30 Minutes Intravenous  Once 08/11/23 1827 08/11/23 1942      Subjective: Patient seen and evaluated today and appears to be back to her usual baseline and is more awake and alert.  Complaining of some jerking to her limbs occasionally, but she relates that this is related to her MS.  Objective: Vitals:   08/12/23 0215 08/12/23 0442 08/12/23 0701 08/12/23 0755  BP: (!) 151/63 (!) 127/53  138/66  Pulse: (!) 113 (!) 105  97  Resp: 18 19 13 18   Temp: 99.5 F (37.5 C) 99.2 F (37.3 C)  98.8 F (37.1 C)  TempSrc:      SpO2: 94% 99%  96%  Weight:      Height:        Intake/Output Summary (Last 24 hours) at 08/12/2023 1116 Last data filed at 08/12/2023 0524 Gross per 24 hour  Intake 1169.8 ml  Output --  Net 1169.8 ml   Filed Weights   08/11/23 1348 08/11/23 2031  Weight: 88.5 kg 93.1 kg    Examination:  General exam: Appears calm and comfortable  Respiratory system: Clear to auscultation. Respiratory effort normal. Cardiovascular system: S1 & S2 heard, RRR.  Gastrointestinal system: Abdomen is soft Central nervous system: Alert  and awake Extremities: No edema Skin: No significant lesions noted Psychiatry: Flat affect.    Data Reviewed: I have personally reviewed following labs and imaging studies  CBC: Recent Labs  Lab 08/11/23 1515 08/11/23 1517 08/12/23 0426  WBC 4.8  --  5.7  NEUTROABS 4.0  --   --   HGB 10.3* 10.9* 9.8*  HCT 32.8* 32.0* 30.8*  MCV 85.2  --  85.8  PLT 141*  --  137*   Basic Metabolic Panel: Recent Labs  Lab 08/11/23 1515 08/11/23 1517 08/11/23 1919 08/12/23 0426  NA 138 139  --  139  K 4.8 4.9  --  3.8  CL 106 105  --  106  CO2 25  --   --  24  GLUCOSE 195* 194*  --  187*  BUN 14 16  --  10  CREATININE 1.04* 1.00  --  0.85  CALCIUM 8.6*  --   --  8.2*  MG  --   --  1.8  --    GFR: Estimated Creatinine Clearance: 81.3 mL/min (by C-G formula based on SCr of 0.85 mg/dL). Liver Function Tests: Recent Labs  Lab 08/11/23 1515  AST 38  ALT 22  ALKPHOS 117  BILITOT 0.6  PROT 7.7  ALBUMIN 2.7*   No results for input(s): "LIPASE", "AMYLASE" in the last 168 hours. Recent Labs  Lab 08/11/23 1515  AMMONIA 42*   Coagulation Profile: No results for input(s): "INR", "PROTIME" in the last 168 hours. Cardiac Enzymes: No results for input(s): "CKTOTAL", "CKMB", "CKMBINDEX", "TROPONINI" in the last 168 hours. BNP (last 3 results) No results for input(s): "PROBNP" in the last 8760 hours. HbA1C: Recent Labs    08/11/23 1515  HGBA1C 9.3*   CBG: Recent Labs  Lab 08/11/23 1508 08/11/23 1626 08/11/23 2218 08/12/23 0747 08/12/23 1029  GLUCAP 204* 191* 130* 140* 155*   Lipid Profile: No results for input(s): "CHOL", "HDL", "LDLCALC", "TRIG", "CHOLHDL", "LDLDIRECT" in the last 72 hours. Thyroid Function Tests: No results for input(s): "TSH", "T4TOTAL", "FREET4", "T3FREE", "THYROIDAB" in the last 72 hours. Anemia Panel: No results for input(s): "VITAMINB12", "FOLATE", "FERRITIN", "TIBC", "IRON", "RETICCTPCT" in the last 72 hours. Sepsis Labs: Recent Labs  Lab  08/11/23 1937  LATICACIDVEN 1.1    Recent Results (from the past 240 hours)  Culture, blood (routine x 2)     Status: None (Preliminary result)   Collection Time: 08/11/23  7:37 PM   Specimen: BLOOD  Result Value Ref Range Status   Specimen Description BLOOD BLOOD LEFT FOREARM  Final   Special Requests   Final    BOTTLES DRAWN AEROBIC AND ANAEROBIC Blood Culture adequate volume   Culture   Final    NO GROWTH < 12 HOURS Performed at [facility], [address] [place], [place] [address]    Report Status PENDING  Incomplete  Culture, blood (routine x 2)     Status: None (Preliminary  result)   Collection Time: 08/11/23  7:37 PM   Specimen: BLOOD  Result Value Ref Range Status   Specimen Description BLOOD BLOOD LEFT HAND  Final   Special Requests   Final    BOTTLES DRAWN AEROBIC AND ANAEROBIC Blood Culture adequate volume   Culture   Final    NO GROWTH < 12 HOURS Performed at [facility], [address], [place], [place] [address]    Report Status PENDING  Incomplete  MRSA Next Gen by PCR, Nasal     Status: None   Collection Time: 08/11/23  8:44 PM   Specimen: Nasal Mucosa; Nasal Swab  Result Value Ref Range Status   MRSA by PCR Next Gen NOT DETECTED NOT DETECTED Final    Comment: (NOTE) The GeneXpert MRSA Assay (FDA approved for NASAL specimens only), is one component of a comprehensive MRSA colonization surveillance program. It is not intended to diagnose MRSA infection nor to guide or monitor treatment for MRSA infections. Test performance is not FDA approved in patients less than 38 years old. Performed at [facility], [address], [place], [place] [address]          Radiology Studies: CT Cervical Spine Wo Contrast Result Date: 08/11/2023 CLINICAL DATA:  Syncope, twitching, fell EXAM: CT CERVICAL SPINE WITHOUT CONTRAST TECHNIQUE: Multidetector CT imaging of the cervical spine was performed without intravenous contrast. Multiplanar CT image reconstructions were  also generated. RADIATION DOSE REDUCTION: This exam was performed according to the departmental dose-optimization program which includes automated exposure control, adjustment of the mA and/or kV according to patient size and/or use of iterative reconstruction technique. COMPARISON:  04/06/2020 FINDINGS: Alignment: Stable mild degenerative anterolisthesis of C3 on C4. Loss of cervical lordosis due to prior C4-C7 ACDF. Skull base and vertebrae: No acute fracture. No primary bone lesion or focal pathologic process. Soft tissues and spinal canal: No prevertebral fluid or swelling. No visible canal hematoma. Disc levels: Prior C4-C7 ACDF. There is moderate spondylosis at C3-4 with mild degenerative anterolisthesis as described above. Multilevel facet hypertrophy most pronounced at C2-3 and C3-4. Upper chest: Airway is patent. Visualized portions of the lung apices are clear. Other: Reconstructed images demonstrate no additional findings. IMPRESSION: 1. No acute cervical spine fracture. 2. Stable multilevel spondylosis and facet hypertrophy greatest at C3-4. 3. Stable C4-C7 ACDF. Electronically Signed   By: Bobbye Burrow M.D.   On: 08/11/2023 15:53   CT Head Wo Contrast Result Date: 08/11/2023 CLINICAL DATA:  Syncope, twitching, altered level of consciousness EXAM: CT HEAD WITHOUT CONTRAST TECHNIQUE: Contiguous axial images were obtained from the base of the skull through the vertex without intravenous contrast. RADIATION DOSE REDUCTION: This exam was performed according to the departmental dose-optimization program which includes automated exposure control, adjustment of the mA and/or kV according to patient size and/or use of iterative reconstruction technique. COMPARISON:  11/28/2021 FINDINGS: Brain: No acute infarct or hemorrhage. Lateral ventricles and midline structures are unremarkable. No acute extra-axial fluid collections. No mass effect. Vascular: No hyperdense vessel or unexpected calcification. Skull:  Normal. Negative for fracture or focal lesion. Sinuses/Orbits: No acute finding. Other: None. IMPRESSION: 1. No acute intracranial process. Electronically Signed   By: Bobbye Burrow M.D.   On: 08/11/2023 15:46   DG Ankle Complete Left Result Date: 08/11/2023 CLINICAL DATA:  Fall EXAM: LEFT ANKLE COMPLETE - 3+ VIEW COMPARISON:  None Available. FINDINGS: No malalignment. Acute avulsion type fracture injuries at the tip of the fibular malleolus. Lateral soft tissue swelling. Ankle mortise is  symmetric. Moderate plantar calcaneal spur IMPRESSION: Acute avulsion type fracture fragments at the tip of the fibular malleolus with lateral soft tissue swelling. Electronically Signed   By: Esmeralda Hedge M.D.   On: 08/11/2023 15:15   DG Chest Port 1 View Result Date: 08/11/2023 CLINICAL DATA:  syncope EXAM: PORTABLE CHEST - 1 VIEW COMPARISON:  December 18, 2022 FINDINGS: Low lung volumes. No focal airspace consolidation, pleural effusion, or pneumothorax. Borderline cardiomegaly. No acute fracture or destructive lesion. Cervical fusion hardware. Spinal stimulator leads. IMPRESSION: Low lung volumes.  No acute cardiopulmonary abnormality. Electronically Signed   By: Rance Burrows M.D.   On: 08/11/2023 15:04        Scheduled Meds:  baclofen   20 mg Oral TID   citalopram   40 mg Oral QPM   clopidogrel   75 mg Oral Daily   enoxaparin  (LOVENOX ) injection  50 mg Subcutaneous Q24H   gabapentin   1,200 mg Oral Daily   insulin  aspart  0-9 Units Subcutaneous TID WC   lisinopril   10 mg Oral Daily   metoprolol  succinate  50 mg Oral Daily   oxycodone   15 mg Oral BID WC   pantoprazole   40 mg Oral BID   pravastatin   40 mg Oral QPM   rOPINIRole   1 mg Oral QHS   Continuous Infusions:  cefTRIAXone  (ROCEPHIN )  IV       LOS: 1 day    Time spent: 55 minutes    Lyrick Lagrand Loran Rock, DO [facility]  If 7PM-7AM, please contact night-coverage [email or web address] 08/12/2023, 11:16 AM

## 2023-08-12 NOTE — Progress Notes (Signed)
 Has been alert and oriented x 4 and speech is clear.  Main complaint is that arms and legs jerk intermittently when both ambulating and at rest.  Was sleeping and the  jerking woke her up just now.   Stated that she experienced this at another time in the past and that her primary MD said if it occurred again he would  do further tests.

## 2023-08-13 DIAGNOSIS — G9341 Metabolic encephalopathy: Secondary | ICD-10-CM | POA: Diagnosis not present

## 2023-08-13 LAB — URINE CULTURE: Culture: >=100000 — AB

## 2023-08-13 LAB — GLUCOSE, CAPILLARY
Glucose-Capillary: 117 mg/dL — ABNORMAL HIGH (ref 70–99)
Glucose-Capillary: 172 mg/dL — ABNORMAL HIGH (ref 70–99)
Glucose-Capillary: 131 mg/dL — ABNORMAL HIGH (ref 70–99)
Glucose-Capillary: 201 mg/dL — ABNORMAL HIGH (ref 70–99)

## 2023-08-13 LAB — CBC
HCT: 30.2 % — ABNORMAL LOW (ref 36.0–46.0)
Hemoglobin: 8.9 g/dL — ABNORMAL LOW (ref 12.0–15.0)
MCH: 25.9 pg — ABNORMAL LOW (ref 26.0–34.0)
MCHC: 29.5 g/dL — ABNORMAL LOW (ref 30.0–36.0)
MCV: 88 fL (ref 80.0–100.0)
Platelets: 118 10*3/uL — ABNORMAL LOW (ref 150–400)
RBC: 3.43 MIL/uL — ABNORMAL LOW (ref 3.87–5.11)
RDW: 15.9 % — ABNORMAL HIGH (ref 11.5–15.5)
WBC: 3.3 10*3/uL — ABNORMAL LOW (ref 4.0–10.5)
nRBC: 0 % (ref 0.0–0.2)

## 2023-08-13 LAB — BASIC METABOLIC PANEL WITH GFR
Anion gap: 4 — ABNORMAL LOW (ref 5–15)
BUN: 10 mg/dL (ref 6–20)
CO2: 26 mmol/L (ref 22–32)
Calcium: 8 mg/dL — ABNORMAL LOW (ref 8.9–10.3)
Chloride: 108 mmol/L (ref 98–111)
Creatinine, Ser: 0.7 mg/dL (ref 0.44–1.00)
GFR, Estimated: >60 mL/min (ref >60)
Glucose, Bld: 101 mg/dL — ABNORMAL HIGH (ref 70–99)
Potassium: 3.6 mmol/L (ref 3.5–5.1)
Sodium: 138 mmol/L (ref 135–145)

## 2023-08-13 LAB — MAGNESIUM: Magnesium: 1.6 mg/dL — ABNORMAL LOW (ref 1.7–2.4)

## 2023-08-13 MED ORDER — MAGNESIUM SULFATE 2 GM/50ML IV SOLN
2.0000 g | Freq: Once | INTRAVENOUS | Status: AC
  Administered 2023-08-13: 2 g via INTRAVENOUS
  Filled 2023-08-13: qty 50

## 2023-08-13 MED ORDER — SODIUM CHLORIDE 0.9 % IV SOLN: INTRAVENOUS | Status: DC | PRN

## 2023-08-13 NOTE — Plan of Care (Signed)

## 2023-08-13 NOTE — TOC Transition Note (Signed)
 Transition of Care [facility]) - Discharge Note   Patient Details  Name: [name] MRN: [id number] Date of Birth: 10/27/1965  [facility] CM/SW Contact:  Grandville Lax, LCSWA Phone Number: 08/13/2023, 10:01 AM   Clinical Narrative:    CSW made referral to OP PT for pt at this time, they will follow in community. CSW updated that DME will be delivered to room by Zack with [employer]. TOC signing off.   Final next level of care: OP Rehab Barriers to Discharge: Barriers Resolved   Patient Goals and CMS Choice            Discharge Placement                       Discharge Plan and Services Additional resources added to the After Visit Summary for                  DME Arranged: 3-N-1 DME Agency: [employer] Date DME Agency Contacted: 08/13/23   Representative spoke with at DME Agency: Zack            Social Drivers of Health (SDOH) Interventions SDOH Screenings   Food Insecurity: No Food Insecurity (08/11/2023)  Housing: High Risk (08/11/2023)  Transportation Needs: No Transportation Needs (08/11/2023)  Utilities: Not At Risk (08/11/2023)  Depression (PHQ2-9): Low Risk  (04/07/2018)  Tobacco Use: Low Risk  (08/11/2023)     Readmission Risk Interventions    08/11/2023   10:00 PM  Readmission Risk Prevention Plan  Post Dischage Appt Complete  Medication Screening Complete  Transportation Screening Complete

## 2023-08-13 NOTE — Progress Notes (Signed)
 Mobility Specialist Progress Note:    08/13/23 1120  Mobility  Activity Ambulated with assistance in hallway  Level of Assistance Standby assist, set-up cues, supervision of patient - no hands on  Assistive Device None  Distance Ambulated (ft) 80 ft  Range of Motion/Exercises Active;All extremities  Activity Response Tolerated well  Mobility Referral Yes  Mobility visit 1 Mobility  Mobility Specialist Start Time (ACUTE ONLY) 1120  Mobility Specialist Stop Time (ACUTE ONLY) 1140  Mobility Specialist Time Calculation (min) (ACUTE ONLY) 20 min   Pt received in bed, agreeable to mobility. Required supervision to stand and ambulate with no AD. Tolerated well,asx throughout. Left pt sititng EOB, all needs met.  Khayla Koppenhaver Mobility Specialist Please contact via Special educational needs teacher or  Rehab office at [phone]

## 2023-08-13 NOTE — Progress Notes (Signed)
 PROGRESS NOTE    [name]  RUE:[id number] DOB: 11/04/1965 DOA: 08/11/2023 PCP: Kathyleen Parkins, MD   Brief Narrative:    [name] is a 58 y.o. female with medical history significant for diabetes mellitus, hypertension, sleep apnea, multiple sclerosis, chronic pain, depression. Patient was brought to the ED reports of altered mental status.  Patient was admitted with acute metabolic encephalopathy secondary to UTI.  She is more awake and alert today and urine culture showed growth of E. coli that is pansensitive.  She continues to have some trouble with nausea this morning and is struggling to have good intake.  Assessment & Plan:   Principal Problem:   Acute metabolic encephalopathy Active Problems:   UTI (urinary tract infection)   Fall at home, initial encounter   Multiple sclerosis (HCC)   Essential hypertension, benign   DM type 2 causing vascular disease (HCC)   Back pain, chronic   CVA (cerebral vascular accident) (HCC)  Assessment and Plan:  Acute metabolic encephalopathy-resolved Presenting with somnolence, falls, twitching, dysuria, delayed response to questions but speech clear. Head and Cx Ct without acute abnormality. Chest xray - clear. UA suggestive of UTI- with + Leukocytes, she also appears dehydrated. - Continue IV Rocephin  and hold further IV fluids - Resume home medications - Diet has been resumed  Intractable nausea with poor oral intake -Continue antiemetics and monitor through today -Anticipate discharge in a.m. if improved   Fall at home, initial encounter Left ankle xray- Acute avulsion type fracture fragments at the tip of the fibular malleolus with lateral soft tissue swelling. PT evaluation recommending home health services, but patient agrees to outpatient PT.   E. coli UTI (urinary tract infection) Symptomatic with dysuria. Presenting with encephalopathy. Rules out for sepsis. Last urine culture on file- 06/2022- pansensitive Ecoli.  -  Continue Ceftriaxone  as cultures are noted to be pansensitive - Complete course of treatment 5/17 with total 3 days   CVA (cerebral vascular accident) [facility]) History of CVA. -Resume Plavix  when mental status has improved   Back pain, chronic On oxycodone  IR 30mg  BID - Hold till improvement in mental status   DM type 2 causing vascular disease (HCC) _ Hgba1c 9.3% - SSI- M - Resume home medications   Essential hypertension, benign Stable - Resume lisinopril , metoprolol    Multiple sclerosis (HCC) Resume Baclofen  PT evaluation   Prolonged Qt- 513. K- 4.8.  - Check Mag  Obesity, class I - BMI 34.16  DVT prophylaxis:Lovenox  Code Status: Full Family Communication: None at bedside Disposition Plan:  Status is: Inpatient Remains inpatient appropriate because: Need for IV medications.   Consultants:  None  Procedures:  None  Antimicrobials:  Anti-infectives (From admission, onward)    Start     Dose/Rate Route Frequency Ordered Stop   08/12/23 1800  cefTRIAXone  (ROCEPHIN ) 2 g in sodium chloride  0.9 % 100 mL IVPB        2 g 200 mL/hr over 30 Minutes Intravenous Every 24 hours 08/11/23 2045     08/11/23 1830  cefTRIAXone  (ROCEPHIN ) 2 g in sodium chloride  0.9 % 100 mL IVPB        2 g 200 mL/hr over 30 Minutes Intravenous  Once 08/11/23 1827 08/11/23 1942      Subjective: Patient seen and evaluated today and appears to be having persistent nausea, but denies any vomiting.  She is having poor oral intake as a result.  Objective: Vitals:   08/12/23 1739 08/12/23 2106 08/13/23 1478 08/13/23 2956  BP: (!) 120/55 (!) 121/49 138/67 (!) 144/69  Pulse: 77 71 65 74  Resp: 13 16 16    Temp:  98.5 F (36.9 C) 98.3 F (36.8 C)   TempSrc:  Oral Oral   SpO2: 96% 97% 100%   Weight:      Height:        Intake/Output Summary (Last 24 hours) at 08/13/2023 1432 Last data filed at 08/13/2023 1008 Gross per 24 hour  Intake 340 ml  Output --  Net 340 ml   Filed Weights    08/11/23 1348 08/11/23 2031  Weight: 88.5 kg 93.1 kg    Examination:  General exam: Appears calm and comfortable  Respiratory system: Clear to auscultation. Respiratory effort normal. Cardiovascular system: S1 & S2 heard, RRR.  Gastrointestinal system: Abdomen is soft Central nervous system: Alert and awake Extremities: No edema Skin: No significant lesions noted Psychiatry: Flat affect.    Data Reviewed: I have personally reviewed following labs and imaging studies  CBC: Recent Labs  Lab 08/11/23 1515 08/11/23 1517 08/12/23 0426 08/13/23 0424  WBC 4.8  --  5.7 3.3*  NEUTROABS 4.0  --   --   --   HGB 10.3* 10.9* 9.8* 8.9*  HCT 32.8* 32.0* 30.8* 30.2*  MCV 85.2  --  85.8 88.0  PLT 141*  --  137* 118*   Basic Metabolic Panel: Recent Labs  Lab 08/11/23 1515 08/11/23 1517 08/11/23 1919 08/12/23 0426 08/13/23 0424  NA 138 139  --  139 138  K 4.8 4.9  --  3.8 3.6  CL 106 105  --  106 108  CO2 25  --   --  24 26  GLUCOSE 195* 194*  --  187* 101*  BUN 14 16  --  10 10  CREATININE 1.04* 1.00  --  0.85 0.70  CALCIUM 8.6*  --   --  8.2* 8.0*  MG  --   --  1.8  --  1.6*   GFR: Estimated Creatinine Clearance: 86.4 mL/min (by C-G formula based on SCr of 0.7 mg/dL). Liver Function Tests: Recent Labs  Lab 08/11/23 1515  AST 38  ALT 22  ALKPHOS 117  BILITOT 0.6  PROT 7.7  ALBUMIN 2.7*   No results for input(s): "LIPASE", "AMYLASE" in the last 168 hours. Recent Labs  Lab 08/11/23 1515  AMMONIA 42*   Coagulation Profile: No results for input(s): "INR", "PROTIME" in the last 168 hours. Cardiac Enzymes: No results for input(s): "CKTOTAL", "CKMB", "CKMBINDEX", "TROPONINI" in the last 168 hours. BNP (last 3 results) No results for input(s): "PROBNP" in the last 8760 hours. HbA1C: Recent Labs    08/11/23 1515  HGBA1C 9.3*   CBG: Recent Labs  Lab 08/12/23 1029 08/12/23 1642 08/12/23 2105 08/13/23 0748 08/13/23 1104  GLUCAP 155* 108* 132* 117* 172*    Lipid Profile: No results for input(s): "CHOL", "HDL", "LDLCALC", "TRIG", "CHOLHDL", "LDLDIRECT" in the last 72 hours. Thyroid Function Tests: No results for input(s): "TSH", "T4TOTAL", "FREET4", "T3FREE", "THYROIDAB" in the last 72 hours. Anemia Panel: No results for input(s): "VITAMINB12", "FOLATE", "FERRITIN", "TIBC", "IRON", "RETICCTPCT" in the last 72 hours. Sepsis Labs: Recent Labs  Lab 08/11/23 1937  LATICACIDVEN 1.1    Recent Results (from the past 240 hours)  Urine Culture     Status: Abnormal   Collection Time: 08/11/23  5:17 PM   Specimen: In/Out Cath Urine  Result Value Ref Range Status   Specimen Description   Final    IN/OUT CATH  URINE Performed at [facility], [address], [place], [place] [address]    Special Requests   Final    NONE Performed at [facility], [address] [place], [place] [address]    Culture >=100,000 COLONIES/mL ESCHERICHIA COLI (A)  Final   Report Status 08/13/2023 FINAL  Final   Organism ID, Bacteria ESCHERICHIA COLI (A)  Final      Susceptibility   Escherichia coli - MIC*    AMPICILLIN <=2 SENSITIVE Sensitive     CEFAZOLIN <=4 SENSITIVE Sensitive     CEFEPIME  <=0.12 SENSITIVE Sensitive     CEFTRIAXONE  <=0.25 SENSITIVE Sensitive     CIPROFLOXACIN  <=0.25 SENSITIVE Sensitive     GENTAMICIN <=1 SENSITIVE Sensitive     IMIPENEM <=0.25 SENSITIVE Sensitive     NITROFURANTOIN <=16 SENSITIVE Sensitive     TRIMETH /SULFA  <=20 SENSITIVE Sensitive     AMPICILLIN/SULBACTAM <=2 SENSITIVE Sensitive     PIP/TAZO <=4 SENSITIVE Sensitive ug/mL    * >=100,000 COLONIES/mL ESCHERICHIA COLI  Culture, blood (routine x 2)     Status: None (Preliminary result)   Collection Time: 08/11/23  7:37 PM   Specimen: BLOOD  Result Value Ref Range Status   Specimen Description BLOOD BLOOD LEFT FOREARM  Final   Special Requests   Final    BOTTLES DRAWN AEROBIC AND ANAEROBIC Blood Culture adequate volume   Culture   Final    NO GROWTH < 12 HOURS Performed  at [facility], [address] [place], [place] [address]    Report Status PENDING  Incomplete  Culture, blood (routine x 2)     Status: None (Preliminary result)   Collection Time: 08/11/23  7:37 PM   Specimen: BLOOD  Result Value Ref Range Status   Specimen Description BLOOD BLOOD LEFT HAND  Final   Special Requests   Final    BOTTLES DRAWN AEROBIC AND ANAEROBIC Blood Culture adequate volume   Culture   Final    NO GROWTH < 12 HOURS Performed at [facility], [address] [place], [place] [address]    Report Status PENDING  Incomplete  MRSA Next Gen by PCR, Nasal     Status: None   Collection Time: 08/11/23  8:44 PM   Specimen: Nasal Mucosa; Nasal Swab  Result Value Ref Range Status   MRSA by PCR Next Gen NOT DETECTED NOT DETECTED Final    Comment: (NOTE) The GeneXpert MRSA Assay (FDA approved for NASAL specimens only), is one component of a comprehensive MRSA colonization surveillance program. It is not intended to diagnose MRSA infection nor to guide or monitor treatment for MRSA infections. Test performance is not FDA approved in patients less than 69 years old. Performed at [facility], [address] [place], [place] [address]          Radiology Studies: CT Cervical Spine Wo Contrast Result Date: 08/11/2023 CLINICAL DATA:  Syncope, twitching, fell EXAM: CT CERVICAL SPINE WITHOUT CONTRAST TECHNIQUE: Multidetector CT imaging of the cervical spine was performed without intravenous contrast. Multiplanar CT image reconstructions were also generated. RADIATION DOSE REDUCTION: This exam was performed according to the departmental dose-optimization program which includes automated exposure control, adjustment of the mA and/or kV according to patient size and/or use of iterative reconstruction technique. COMPARISON:  04/06/2020 FINDINGS: Alignment: Stable mild degenerative anterolisthesis of C3 on C4. Loss of cervical lordosis due to prior C4-C7 ACDF. Skull base and vertebrae:  No acute fracture. No primary bone lesion or focal pathologic process. Soft tissues and spinal  canal: No prevertebral fluid or swelling. No visible canal hematoma. Disc levels: Prior C4-C7 ACDF. There is moderate spondylosis at C3-4 with mild degenerative anterolisthesis as described above. Multilevel facet hypertrophy most pronounced at C2-3 and C3-4. Upper chest: Airway is patent. Visualized portions of the lung apices are clear. Other: Reconstructed images demonstrate no additional findings. IMPRESSION: 1. No acute cervical spine fracture. 2. Stable multilevel spondylosis and facet hypertrophy greatest at C3-4. 3. Stable C4-C7 ACDF. Electronically Signed   By: Bobbye Burrow M.D.   On: 08/11/2023 15:53   CT Head Wo Contrast Result Date: 08/11/2023 CLINICAL DATA:  Syncope, twitching, altered level of consciousness EXAM: CT HEAD WITHOUT CONTRAST TECHNIQUE: Contiguous axial images were obtained from the base of the skull through the vertex without intravenous contrast. RADIATION DOSE REDUCTION: This exam was performed according to the departmental dose-optimization program which includes automated exposure control, adjustment of the mA and/or kV according to patient size and/or use of iterative reconstruction technique. COMPARISON:  11/28/2021 FINDINGS: Brain: No acute infarct or hemorrhage. Lateral ventricles and midline structures are unremarkable. No acute extra-axial fluid collections. No mass effect. Vascular: No hyperdense vessel or unexpected calcification. Skull: Normal. Negative for fracture or focal lesion. Sinuses/Orbits: No acute finding. Other: None. IMPRESSION: 1. No acute intracranial process. Electronically Signed   By: Bobbye Burrow M.D.   On: 08/11/2023 15:46   DG Ankle Complete Left Result Date: 08/11/2023 CLINICAL DATA:  Fall EXAM: LEFT ANKLE COMPLETE - 3+ VIEW COMPARISON:  None Available. FINDINGS: No malalignment. Acute avulsion type fracture injuries at the tip of the fibular malleolus.  Lateral soft tissue swelling. Ankle mortise is symmetric. Moderate plantar calcaneal spur IMPRESSION: Acute avulsion type fracture fragments at the tip of the fibular malleolus with lateral soft tissue swelling. Electronically Signed   By: Esmeralda Hedge M.D.   On: 08/11/2023 15:15   DG Chest Port 1 View Result Date: 08/11/2023 CLINICAL DATA:  syncope EXAM: PORTABLE CHEST - 1 VIEW COMPARISON:  December 18, 2022 FINDINGS: Low lung volumes. No focal airspace consolidation, pleural effusion, or pneumothorax. Borderline cardiomegaly. No acute fracture or destructive lesion. Cervical fusion hardware. Spinal stimulator leads. IMPRESSION: Low lung volumes.  No acute cardiopulmonary abnormality. Electronically Signed   By: Rance Burrows M.D.   On: 08/11/2023 15:04        Scheduled Meds:  baclofen   20 mg Oral TID   citalopram   40 mg Oral QPM   clopidogrel   75 mg Oral Daily   enoxaparin  (LOVENOX ) injection  40 mg Subcutaneous Q24H   gabapentin   400 mg Oral TID   insulin  aspart  0-9 Units Subcutaneous TID WC   lisinopril   10 mg Oral Daily   metoprolol  succinate  50 mg Oral Daily   oxycodone   15 mg Oral BID WC   pantoprazole   40 mg Oral BID   pravastatin   40 mg Oral QPM   rOPINIRole   1 mg Oral QHS   Continuous Infusions:  cefTRIAXone  (ROCEPHIN )  IV Stopped (08/12/23 1812)     LOS: 2 days    Time spent: 55 minutes    Adeleigh Barletta D Mason Sole, DO [facility]  If 7PM-7AM, please contact night-coverage [email or web address] 08/13/2023, 2:32 PM

## 2023-08-14 DIAGNOSIS — G9341 Metabolic encephalopathy: Secondary | ICD-10-CM | POA: Diagnosis not present

## 2023-08-14 LAB — GLUCOSE, CAPILLARY: Glucose-Capillary: 128 mg/dL — ABNORMAL HIGH (ref 70–99)

## 2023-08-14 LAB — BASIC METABOLIC PANEL WITH GFR
Anion gap: 7 (ref 5–15)
BUN: 9 mg/dL (ref 6–20)
CO2: 27 mmol/L (ref 22–32)
Calcium: 8.5 mg/dL — ABNORMAL LOW (ref 8.9–10.3)
Chloride: 102 mmol/L (ref 98–111)
Creatinine, Ser: 0.73 mg/dL (ref 0.44–1.00)
GFR, Estimated: >60 mL/min (ref >60)
Glucose, Bld: 135 mg/dL — ABNORMAL HIGH (ref 70–99)
Potassium: 4 mmol/L (ref 3.5–5.1)
Sodium: 136 mmol/L (ref 135–145)

## 2023-08-14 LAB — MAGNESIUM: Magnesium: 1.7 mg/dL (ref 1.7–2.4)

## 2023-08-14 LAB — CBC
HCT: 31.8 % — ABNORMAL LOW (ref 36.0–46.0)
Hemoglobin: 9.7 g/dL — ABNORMAL LOW (ref 12.0–15.0)
MCH: 26.1 pg (ref 26.0–34.0)
MCHC: 30.5 g/dL (ref 30.0–36.0)
MCV: 85.7 fL (ref 80.0–100.0)
Platelets: 128 10*3/uL — ABNORMAL LOW (ref 150–400)
RBC: 3.71 MIL/uL — ABNORMAL LOW (ref 3.87–5.11)
RDW: 15.5 % (ref 11.5–15.5)
WBC: 3.3 10*3/uL — ABNORMAL LOW (ref 4.0–10.5)
nRBC: 0 % (ref 0.0–0.2)

## 2023-08-14 NOTE — Discharge Summary (Signed)
 Physician Discharge Summary  [name] ZOX:[id number] DOB: May 02, 1965 DOA: 08/11/2023  PCP: Kathyleen Parkins, MD  Admit date: 08/11/2023  Discharge date: 08/14/2023  Admitted From: Home  Disposition: Home  Recommendations for Outpatient Follow-up:  Follow up with PCP in 1-2 weeks Continue home medications as prior Patient has completed 3-day course of IV antibiotics for UTI  Home Health: None, outpatient PT  Equipment/Devices: None  Discharge Condition:Stable  CODE STATUS: Full  Diet recommendation: Heart Healthy/carb modified  Brief/Interim Summary: [name] is a 58 y.o. female with medical history significant for diabetes mellitus, hypertension, sleep apnea, multiple sclerosis, chronic pain, depression. Patient was brought to the ED reports of altered mental status.  Patient was admitted with acute metabolic encephalopathy secondary to UTI.  She is more awake and alert today and urine culture showed growth of E. coli that is pansensitive.  She has now completed 3-day course of IV antibiotics and is in stable condition for discharge.  No other acute events or concerns noted.  Discharge Diagnoses:  Principal Problem:   Acute metabolic encephalopathy Active Problems:   UTI (urinary tract infection)   Fall at home, initial encounter   Multiple sclerosis (HCC)   Essential hypertension, benign   DM type 2 causing vascular disease (HCC)   Back pain, chronic   CVA (cerebral vascular accident) (HCC)  Principal discharge diagnosis: Acute metabolic encephalopathy secondary to E. coli UTI.  Discharge Instructions  Discharge Instructions     Ambulatory referral to Physical Therapy   Complete by: As directed    Diet - low sodium heart healthy   Complete by: As directed    Increase activity slowly   Complete by: As directed       Allergies as of 08/14/2023       Reactions   Peanut-containing Drug Products Anaphylaxis, Hives   Patient states she is allergic to all nuts    Shellfish Allergy Anaphylaxis   Gluten Meal Other (See Comments)   Unknown    Latex Other (See Comments)   Blistering   Prednisone Hives, Other (See Comments)   Causes psychological issues    Nexium [esomeprazole Magnesium ] Hives   Tape Rash   Paper tape is ok to use        Medication List     TAKE these medications    Accu-Chek Guide Me w/Device Kit USE TO CHECK BLOOD SUGAR DAILY AS DIRECTED   Accu-Chek Guide test strip Generic drug: glucose blood USE TO CHECK BLOOD SUGAR 4 TIMES DAILY AS DIRECTED   Accu-Chek Softclix Lancets lancets USE TO CHECK BLOOD SUGAR 4 TIMES DAILY AS DIRECTED   albuterol  108 (90 Base) MCG/ACT inhaler Commonly known as: VENTOLIN  HFA Inhale 1-2 puffs into the lungs every 6 (six) hours as needed for wheezing or shortness of breath.   albuterol  (2.5 MG/3ML) 0.083% nebulizer solution Commonly known as: PROVENTIL  Take 2.5 mg by nebulization every 4 (four) hours as needed for wheezing or shortness of breath.   B-D ULTRAFINE III SHORT PEN 31G X 8 MM Misc Generic drug: Insulin  Pen Needle USE TO INJECT INSULIN  2 TIMES DAILY   baclofen  20 MG tablet Commonly known as: LIORESAL  Take 20 mg by mouth 3 (three) times daily.   blood glucose meter kit and supplies Kit Dispense based on patient and insurance preference. Use up to four times daily as directed. (FOR ICD-9 250.00, 250.01).   citalopram  40 MG tablet Commonly known as: CELEXA  Take 40 mg by mouth every evening.  clopidogrel  75 MG tablet Commonly known as: PLAVIX  Take 75 mg by mouth daily.   Dexcom G7 Receiver Devi Use to check glucose as directed   Dexcom G7 Sensor Misc Change sensor every 10 days   estradiol  0.1 MG/GM vaginal cream Commonly known as: ESTRACE  Counselling psychologist. Insert a blueberry size amount (approximately 1 gram) of cream on fingertip inside vagina at bedtime every night for 1 week then every other night. For long term use.   gabapentin  300 MG  capsule Commonly known as: NEURONTIN  Take 4-5 capsules by mouth daily.   Gemtesa  75 MG Tabs Generic drug: Vibegron  Take 1 tablet (75 mg total) by mouth daily.   glipiZIDE  10 MG tablet Commonly known as: GLUCOTROL  Take 10 mg by mouth daily before breakfast.   lisinopril  10 MG tablet Commonly known as: ZESTRIL  Take 10 mg by mouth daily.   metFORMIN  500 MG tablet Commonly known as: GLUCOPHAGE  Take 1,000 mg by mouth 2 (two) times daily.   metoprolol  succinate 50 MG 24 hr tablet Commonly known as: TOPROL -XL Take 1 tablet (50 mg total) by mouth daily. Take with or immediately following a meal.   nitrofurantoin (macrocrystal-monohydrate) 100 MG capsule Commonly known as: MACROBID Take 100 mg by mouth daily.   NovoLIN  70/30 Kwikpen (70-30) 100 UNIT/ML KwikPen Generic drug: insulin  isophane & regular human KwikPen Inject 10 Units into the skin 2 (two) times daily before a meal. What changed: how much to take   ondansetron  4 MG tablet Commonly known as: ZOFRAN  Take 1 tablet (4 mg total) by mouth 4 (four) times daily as needed for nausea or vomiting.   oxycodone  30 MG immediate release tablet Commonly known as: ROXICODONE  Take 15 mg by mouth See admin instructions. Take 15 mg (1/2 tablet) by mouth in the morning and mid afternoon. Take an additional 15 mg (1/2 tablet) as needed.   pantoprazole  40 MG tablet Commonly known as: Protonix  Take 1 tablet (40 mg total) by mouth 2 (two) times daily.   pravastatin  40 MG tablet Commonly known as: PRAVACHOL  Take 1 tablet (40 mg total) by mouth every evening.   rOPINIRole  0.5 MG tablet Commonly known as: REQUIP  Take 0.5 mg by mouth at bedtime.   Vitamin D  (Ergocalciferol ) 1.25 MG (50000 UNIT) Caps capsule Commonly known as: DRISDOL Take 50,000 Units by mouth once a week.               Durable Medical Equipment  (From admission, onward)           Start     Ordered   08/13/23 0952  For home use only DME 3 n 1  Once         08/13/23 0951   08/12/23 1533  For home use only DME Walker rolling  Once       Comments: Patient s/p left ankle injury and will benefit from use of RW for safety at home.  Question Answer Comment  Walker: With 5 Inch Wheels   Patient needs a walker to treat with the following condition Gait difficulty      08/12/23 1533            Follow-up Information     Kathyleen Parkins, MD. Schedule an appointment as soon as possible for a visit in 1 week(s).   Specialty: Internal Medicine Contact information: [address] [place] [address] [phone]                Allergies  Allergen Reactions  Peanut-Containing Drug Products Anaphylaxis and Hives    Patient states she is allergic to all nuts   Shellfish Allergy Anaphylaxis   Gluten Meal Other (See Comments)    Unknown    Latex Other (See Comments)    Blistering   Prednisone Hives and Other (See Comments)    Causes psychological issues    Nexium [Esomeprazole Magnesium ] Hives   Tape Rash    Paper tape is ok to use    Consultations: None   Procedures/Studies: CT Cervical Spine Wo Contrast Result Date: 08/11/2023 CLINICAL DATA:  Syncope, twitching, fell EXAM: CT CERVICAL SPINE WITHOUT CONTRAST TECHNIQUE: Multidetector CT imaging of the cervical spine was performed without intravenous contrast. Multiplanar CT image reconstructions were also generated. RADIATION DOSE REDUCTION: This exam was performed according to the departmental dose-optimization program which includes automated exposure control, adjustment of the mA and/or kV according to patient size and/or use of iterative reconstruction technique. COMPARISON:  04/06/2020 FINDINGS: Alignment: Stable mild degenerative anterolisthesis of C3 on C4. Loss of cervical lordosis due to prior C4-C7 ACDF. Skull base and vertebrae: No acute fracture. No primary bone lesion or focal pathologic process. Soft tissues and spinal canal: No prevertebral fluid or swelling.  No visible canal hematoma. Disc levels: Prior C4-C7 ACDF. There is moderate spondylosis at C3-4 with mild degenerative anterolisthesis as described above. Multilevel facet hypertrophy most pronounced at C2-3 and C3-4. Upper chest: Airway is patent. Visualized portions of the lung apices are clear. Other: Reconstructed images demonstrate no additional findings. IMPRESSION: 1. No acute cervical spine fracture. 2. Stable multilevel spondylosis and facet hypertrophy greatest at C3-4. 3. Stable C4-C7 ACDF. Electronically Signed   By: Bobbye Burrow M.D.   On: 08/11/2023 15:53   CT Head Wo Contrast Result Date: 08/11/2023 CLINICAL DATA:  Syncope, twitching, altered level of consciousness EXAM: CT HEAD WITHOUT CONTRAST TECHNIQUE: Contiguous axial images were obtained from the base of the skull through the vertex without intravenous contrast. RADIATION DOSE REDUCTION: This exam was performed according to the departmental dose-optimization program which includes automated exposure control, adjustment of the mA and/or kV according to patient size and/or use of iterative reconstruction technique. COMPARISON:  11/28/2021 FINDINGS: Brain: No acute infarct or hemorrhage. Lateral ventricles and midline structures are unremarkable. No acute extra-axial fluid collections. No mass effect. Vascular: No hyperdense vessel or unexpected calcification. Skull: Normal. Negative for fracture or focal lesion. Sinuses/Orbits: No acute finding. Other: None. IMPRESSION: 1. No acute intracranial process. Electronically Signed   By: Bobbye Burrow M.D.   On: 08/11/2023 15:46   DG Ankle Complete Left Result Date: 08/11/2023 CLINICAL DATA:  Fall EXAM: LEFT ANKLE COMPLETE - 3+ VIEW COMPARISON:  None Available. FINDINGS: No malalignment. Acute avulsion type fracture injuries at the tip of the fibular malleolus. Lateral soft tissue swelling. Ankle mortise is symmetric. Moderate plantar calcaneal spur IMPRESSION: Acute avulsion type fracture  fragments at the tip of the fibular malleolus with lateral soft tissue swelling. Electronically Signed   By: Esmeralda Hedge M.D.   On: 08/11/2023 15:15   DG Chest Port 1 View Result Date: 08/11/2023 CLINICAL DATA:  syncope EXAM: PORTABLE CHEST - 1 VIEW COMPARISON:  December 18, 2022 FINDINGS: Low lung volumes. No focal airspace consolidation, pleural effusion, or pneumothorax. Borderline cardiomegaly. No acute fracture or destructive lesion. Cervical fusion hardware. Spinal stimulator leads. IMPRESSION: Low lung volumes.  No acute cardiopulmonary abnormality. Electronically Signed   By: Rance Burrows M.D.   On: 08/11/2023 15:04     Discharge Exam: Vitals:  08/14/23 0411 08/14/23 0805  BP: (!) 102/49 138/67  Pulse: 65 69  Resp:    Temp: 98 F (36.7 C)   SpO2: 100%    Vitals:   08/13/23 1451 08/13/23 1930 08/14/23 0411 08/14/23 0805  BP: (!) 120/55 (!) 110/51 (!) 102/49 138/67  Pulse: 60 64 65 69  Resp:      Temp: 98 F (36.7 C) 98.4 F (36.9 C) 98 F (36.7 C)   TempSrc: Oral Oral Oral   SpO2: 97% 97% 100%   Weight:      Height:        General: Pt is alert, awake, not in acute distress Cardiovascular: RRR, S1/S2 +, no rubs, no gallops Respiratory: CTA bilaterally, no wheezing, no rhonchi Abdominal: Soft, NT, ND, bowel sounds + Extremities: no edema, no cyanosis    The results of significant diagnostics from this hospitalization (including imaging, microbiology, ancillary and laboratory) are listed below for reference.     Microbiology: Recent Results (from the past 240 hours)  Urine Culture     Status: Abnormal   Collection Time: 08/11/23  5:17 PM   Specimen: In/Out Cath Urine  Result Value Ref Range Status   Specimen Description   Final    IN/OUT CATH URINE Performed at [facility], [address] [place], [place] [address]    Special Requests   Final    NONE Performed at [facility], [address] [place], [place] [address]    Culture >=100,000  COLONIES/mL ESCHERICHIA COLI (A)  Final   Report Status 08/13/2023 FINAL  Final   Organism ID, Bacteria ESCHERICHIA COLI (A)  Final      Susceptibility   Escherichia coli - MIC*    AMPICILLIN <=2 SENSITIVE Sensitive     CEFAZOLIN <=4 SENSITIVE Sensitive     CEFEPIME  <=0.12 SENSITIVE Sensitive     CEFTRIAXONE  <=0.25 SENSITIVE Sensitive     CIPROFLOXACIN  <=0.25 SENSITIVE Sensitive     GENTAMICIN <=1 SENSITIVE Sensitive     IMIPENEM <=0.25 SENSITIVE Sensitive     NITROFURANTOIN <=16 SENSITIVE Sensitive     TRIMETH /SULFA  <=20 SENSITIVE Sensitive     AMPICILLIN/SULBACTAM <=2 SENSITIVE Sensitive     PIP/TAZO <=4 SENSITIVE Sensitive ug/mL    * >=100,000 COLONIES/mL ESCHERICHIA COLI  Culture, blood (routine x 2)     Status: None (Preliminary result)   Collection Time: 08/11/23  7:37 PM   Specimen: BLOOD  Result Value Ref Range Status   Specimen Description BLOOD BLOOD LEFT FOREARM  Final   Special Requests   Final    BOTTLES DRAWN AEROBIC AND ANAEROBIC Blood Culture adequate volume   Culture   Final    NO GROWTH 3 DAYS Performed at [facility], [address], [place], [place] [address]    Report Status PENDING  Incomplete  Culture, blood (routine x 2)     Status: None (Preliminary result)   Collection Time: 08/11/23  7:37 PM   Specimen: BLOOD  Result Value Ref Range Status   Specimen Description BLOOD BLOOD LEFT HAND  Final   Special Requests   Final    BOTTLES DRAWN AEROBIC AND ANAEROBIC Blood Culture adequate volume   Culture   Final    NO GROWTH 3 DAYS Performed at [facility], [address] [place], [place] [address]    Report Status PENDING  Incomplete  MRSA Next Gen by PCR, Nasal     Status: None   Collection Time: 08/11/23  8:44 PM   Specimen: Nasal Mucosa;  Nasal Swab  Result Value Ref Range Status   MRSA by PCR Next Gen NOT DETECTED NOT DETECTED Final    Comment: (NOTE) The GeneXpert MRSA Assay (FDA approved for NASAL specimens only), is one component of a  comprehensive MRSA colonization surveillance program. It is not intended to diagnose MRSA infection nor to guide or monitor treatment for MRSA infections. Test performance is not FDA approved in patients less than 72 years old. Performed at [facility], [address], [place], [place] [address]      Labs: BNP (last 3 results) No results for input(s): "BNP" in the last 8760 hours. Basic Metabolic Panel: Recent Labs  Lab 08/11/23 1515 08/11/23 1517 08/11/23 1919 08/12/23 0426 08/13/23 0424 08/14/23 0341  NA 138 139  --  139 138 136  K 4.8 4.9  --  3.8 3.6 4.0  CL 106 105  --  106 108 102  CO2 25  --   --  24 26 27   GLUCOSE 195* 194*  --  187* 101* 135*  BUN 14 16  --  10 10 9   CREATININE 1.04* 1.00  --  0.85 0.70 0.73  CALCIUM 8.6*  --   --  8.2* 8.0* 8.5*  MG  --   --  1.8  --  1.6* 1.7   Liver Function Tests: Recent Labs  Lab 08/11/23 1515  AST 38  ALT 22  ALKPHOS 117  BILITOT 0.6  PROT 7.7  ALBUMIN 2.7*   No results for input(s): "LIPASE", "AMYLASE" in the last 168 hours. Recent Labs  Lab 08/11/23 1515  AMMONIA 42*   CBC: Recent Labs  Lab 08/11/23 1515 08/11/23 1517 08/12/23 0426 08/13/23 0424 08/14/23 0700  WBC 4.8  --  5.7 3.3* 3.3*  NEUTROABS 4.0  --   --   --   --   HGB 10.3* 10.9* 9.8* 8.9* 9.7*  HCT 32.8* 32.0* 30.8* 30.2* 31.8*  MCV 85.2  --  85.8 88.0 85.7  PLT 141*  --  137* 118* 128*   Cardiac Enzymes: No results for input(s): "CKTOTAL", "CKMB", "CKMBINDEX", "TROPONINI" in the last 168 hours. BNP: Invalid input(s): "POCBNP" CBG: Recent Labs  Lab 08/13/23 0748 08/13/23 1104 08/13/23 1632 08/13/23 2112 08/14/23 0744  GLUCAP 117* 172* 131* 201* 128*   D-Dimer No results for input(s): "DDIMER" in the last 72 hours. Hgb A1c Recent Labs    08/11/23 1515  HGBA1C 9.3*   Lipid Profile No results for input(s): "CHOL", "HDL", "LDLCALC", "TRIG", "CHOLHDL", "LDLDIRECT" in the last 72 hours. Thyroid function studies No results for  input(s): "TSH", "T4TOTAL", "T3FREE", "THYROIDAB" in the last 72 hours.  Invalid input(s): "FREET3" Anemia work up No results for input(s): "VITAMINB12", "FOLATE", "FERRITIN", "TIBC", "IRON", "RETICCTPCT" in the last 72 hours. Urinalysis    Component Value Date/Time   COLORURINE YELLOW 08/11/2023 1717   APPEARANCEUR CLOUDY (A) 08/11/2023 1717   LABSPEC 1.010 08/11/2023 1717   PHURINE 6.0 08/11/2023 1717   GLUCOSEU NEGATIVE 08/11/2023 1717   HGBUR NEGATIVE 08/11/2023 1717   BILIRUBINUR NEGATIVE 08/11/2023 1717   KETONESUR NEGATIVE 08/11/2023 1717   PROTEINUR NEGATIVE 08/11/2023 1717   UROBILINOGEN 1.0 06/26/2014 1720   NITRITE NEGATIVE 08/11/2023 1717   LEUKOCYTESUR LARGE (A) 08/11/2023 1717   Sepsis Labs Recent Labs  Lab 08/11/23 1515 08/12/23 0426 08/13/23 0424 08/14/23 0700  WBC 4.8 5.7 3.3* 3.3*   Microbiology Recent Results (from the past 240 hours)  Urine Culture     Status: Abnormal   Collection Time:  08/11/23  5:17 PM   Specimen: In/Out Cath Urine  Result Value Ref Range Status   Specimen Description   Final    IN/OUT CATH URINE Performed at [facility], [address] [place] [place], [place] [address]    Special Requests   Final    NONE Performed at [facility], [address] [place], [place] [address]    Culture >=100,000 COLONIES/mL ESCHERICHIA COLI (A)  Final   Report Status 08/13/2023 FINAL  Final   Organism ID, Bacteria ESCHERICHIA COLI (A)  Final      Susceptibility   Escherichia coli - MIC*    AMPICILLIN <=2 SENSITIVE Sensitive     CEFAZOLIN <=4 SENSITIVE Sensitive     CEFEPIME  <=0.12 SENSITIVE Sensitive     CEFTRIAXONE  <=0.25 SENSITIVE Sensitive     CIPROFLOXACIN  <=0.25 SENSITIVE Sensitive     GENTAMICIN <=1 SENSITIVE Sensitive     IMIPENEM <=0.25 SENSITIVE Sensitive     NITROFURANTOIN <=16 SENSITIVE Sensitive     TRIMETH /SULFA  <=20 SENSITIVE Sensitive     AMPICILLIN/SULBACTAM <=2 SENSITIVE Sensitive     PIP/TAZO <=4 SENSITIVE Sensitive ug/mL     * >=100,000 COLONIES/mL ESCHERICHIA COLI  Culture, blood (routine x 2)     Status: None (Preliminary result)   Collection Time: 08/11/23  7:37 PM   Specimen: BLOOD  Result Value Ref Range Status   Specimen Description BLOOD BLOOD LEFT FOREARM  Final   Special Requests   Final    BOTTLES DRAWN AEROBIC AND ANAEROBIC Blood Culture adequate volume   Culture   Final    NO GROWTH 3 DAYS Performed at [facility], [address], [place], [place] [address]    Report Status PENDING  Incomplete  Culture, blood (routine x 2)     Status: None (Preliminary result)   Collection Time: 08/11/23  7:37 PM   Specimen: BLOOD  Result Value Ref Range Status   Specimen Description BLOOD BLOOD LEFT HAND  Final   Special Requests   Final    BOTTLES DRAWN AEROBIC AND ANAEROBIC Blood Culture adequate volume   Culture   Final    NO GROWTH 3 DAYS Performed at [facility], [address] [place], [place] [address]    Report Status PENDING  Incomplete  MRSA Next Gen by PCR, Nasal     Status: None   Collection Time: 08/11/23  8:44 PM   Specimen: Nasal Mucosa; Nasal Swab  Result Value Ref Range Status   MRSA by PCR Next Gen NOT DETECTED NOT DETECTED Final    Comment: (NOTE) The GeneXpert MRSA Assay (FDA approved for NASAL specimens only), is one component of a comprehensive MRSA colonization surveillance program. It is not intended to diagnose MRSA infection nor to guide or monitor treatment for MRSA infections. Test performance is not FDA approved in patients less than 56 years old. Performed at [facility], [address], [place], [place] [address]      Time coordinating discharge: 35 minutes  SIGNED:   Cornelius Dill, DO [facility] 08/14/2023, 9:57 AM  If 7PM-7AM, please contact night-coverage [email or web address]

## 2023-08-14 NOTE — Progress Notes (Signed)
 Mobility Specialist Progress Note:    08/14/23 0915  Mobility  Activity Ambulated with assistance in hallway  Level of Assistance Standby assist, set-up cues, supervision of patient - no hands on  Assistive Device None  Distance Ambulated (ft) 120 ft  Range of Motion/Exercises Active;All extremities  Activity Response Tolerated well  Mobility Referral Yes  Mobility visit 1 Mobility  Mobility Specialist Start Time (ACUTE ONLY) 0915  Mobility Specialist Stop Time (ACUTE ONLY) 0935  Mobility Specialist Time Calculation (min) (ACUTE ONLY) 20 min   Pt received in bed, agreeable to mobility. Required supervision to stand and ambulate with no AD. Tolerated well, asx throughout. Left pt sitting EOB, all needs met.  Sherrine Salberg Mobility Specialist Please contact via Special educational needs teacher or  Rehab office at [phone]

## 2023-08-16 LAB — CULTURE, BLOOD (ROUTINE X 2)
Culture: NO GROWTH
Culture: NO GROWTH
Special Requests: ADEQUATE
Special Requests: ADEQUATE

## 2023-10-12 ENCOUNTER — Encounter: Payer: Self-pay | Admitting: Neurology

## 2023-10-19 ENCOUNTER — Other Ambulatory Visit (HOSPITAL_COMMUNITY): Payer: Self-pay | Admitting: Orthopaedic Surgery

## 2023-10-19 DIAGNOSIS — M25572 Pain in left ankle and joints of left foot: Secondary | ICD-10-CM

## 2023-11-10 ENCOUNTER — Other Ambulatory Visit: Payer: Self-pay | Admitting: *Deleted

## 2023-11-10 DIAGNOSIS — R7989 Other specified abnormal findings of blood chemistry: Secondary | ICD-10-CM

## 2023-11-10 DIAGNOSIS — D500 Iron deficiency anemia secondary to blood loss (chronic): Secondary | ICD-10-CM

## 2023-11-10 DIAGNOSIS — K7460 Unspecified cirrhosis of liver: Secondary | ICD-10-CM

## 2023-12-15 ENCOUNTER — Other Ambulatory Visit: Payer: Self-pay | Admitting: Gastroenterology

## 2023-12-15 NOTE — Telephone Encounter (Signed)
 Giving one month refill.  Has not had follow-up in 13 months.  Is a nevus cirrhosis and reflux follow-up prior to further refills.

## 2024-01-10 ENCOUNTER — Ambulatory Visit: Admitting: Neurology

## 2024-01-10 ENCOUNTER — Encounter: Payer: Self-pay | Admitting: Neurology

## 2024-01-10 NOTE — Progress Notes (Deleted)
 NEUROLOGY CONSULTATION NOTE  [name] MRN: [id number] DOB: September 26, 1965  Referring provider: Jerilynn Carnes, MD Primary care provider: ***  Reason for consult:  multiple sclerosis  Assessment/Plan:   ***   Subjective:  [name] is a 58 year old ***-handed female with multiple sclerosis, sleep apnea, chronic pain syndrome, cirrhosis, DM 2, HTN, HLD, and asthma who presents for multiple sclerosis.  History supplemented by hospital records, previous neurologist's notes and referring provider's note.  In 2006, she experienced left facial numbness and visual changes in her left eye.  At the time, noted to have a left APD.  MRI revealed nonspecifc white matter changes.  She had an LP but results are unknown.  She reportedly had an abnormal visual evoked potential and was diagnosed with MS.  She was started on ***.  She subsequently went to the MS clinic at [facility] for a second opinion and was maintained on DMT based on history and exam findings.  MRI of brain surveillance in November 2013 again demonstrated nonspecific periventricular and subcortical white matter changes.  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 Neurosurgery recommended no intervention.   Due to elevated LFTs, *** was switched to Copaxone around ***.    On 03/11/2019, she developed acute left facial droop, left sided blurred vision and left lower extremity weakness.  CT head/CT perfusion was negative for acute stroke and CTA head and neck revealed no LVO or hemodynamically significant stenosis.  She was unable to get an MRI because in the interim, she had a spinal cord stimulator implanted.  LDL was 81 but Hgb A1c was elevated at 14.7.  She was ultimately diagnosed with probable stroke and discharged on DAPT for 3 weeks followed by Plavix  alone as well as increasing home insulin  and pravastatin .  Symptoms improved.  She was found to have cataract in the left eye and blurred vision  improved after removal in 2021.    She was most recently hospitalized in May 2025 for metabolic encephalopathy in setting of UTI in which she passed out, started twitching and was confused.  CT head showed no acute abnormality.  CT C-spine showed stable multilevel spondylosis and facet hypertrophy (greatest at C3-4) and stable C4-C7 ACDF but no acute fractures.    ***.  She has cirrhosis of the liver but hepatic function has been within normal limits.   Imaging: 03/11/2019 CT PERFUSION:  negative for acute infarct or ischemia. 03/11/2019 CTA HEAD & NECK:  Negative CTA head and neck 05/29/2012 CTA HEAD & NECK:  No CT evidence of significant vascular abnormality. Left occipital lobe developmental venous anomaly again noted.  02/03/2012 MRI BRAIN W WO:  No specific evidence of multiple sclerosis. There is a scattering of punctate T2 hyperintensities within the periventricular and subcortical white matter unusual for the patient's age but entirely nonspecific in appearance. No enhancement or restriction of diffusion Abnormal vascular structures on the medial aspect of the left occipital horn most likely conform to the expected appearance of an anomaly of venous drainage, but the presence of a tangle of other vessels associated with this raises the question of a tiny micro-AVM. This could be pursued further with a CT angiogram  08/17/2011 MRI T-SPINE WO:  1.  Facet degenerative disease most notable T6-7 bilaterally where there is mild marrow edema about the joints.  2.  Mild degenerative disc disease without central canal or foraminal narrowing.  06/17/2009 MRI T-SPINE WO:  Mild thoracic disc  degeneration.  No disc protrusion or spinal  stenosis.  06/17/2009 MRI L-SPINE WO:  No significant abnormalities detected.  There is mild motion on the study.  05/03/2009 MRA HEAD WO:  Negative. 04/23/2009 MRI BRAIN W:  Negative MRI head with contrast.  03/12/2009 MRI BRAIN WO:  No acute or focal intracranial  abnormality.  Mild chronic  microvascular ischemic change.  No cause seen for facial  paresthesias and numbness.  No change from priors.  11/19/2005 MRI BRAIN WO:  No acutely significant finding. No abnormality is seen to explain the patient's described symptoms. 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 Rarely, venous angiomas can be associated with headache but those often show signs of previous hemorrhage, absent in this case.  11/19/2005 MRI C-SPINE WO:  1. Good appearance of the fusion level of C6-7. 2. Degenerative spondylosis at C5-6 with endplate osteophytes and protruding disc material that efface the ventral subarachnoid space and touch the ventral aspect of the cord. Uncovertebral encroachment upon the foramina bilaterally. These degenerative changes have worsened only slightly since the study of 07/05/02.  3. Mild facet degeneration on the right at C2-3 and C3-4 that appears the same as it did on the previous exam.  07/06/2002 MRI C-SPINE WO:  1. Progression of the left paracentral disc abnormality with extruded disc now effacing the anterolateral canal and displacing the spinal cord dorsally.  2.  Mild posterior broad based disc bulge at C5-06. 11/02/2000 MRI C-SPINE WO:  Left paracentral disk herniation, C6-7 effacing CSF and lying in close proximity to the left C8 root without causing nerve root compression or displacement.  Bulging disk at C5-6. 06/29/2000 MRI BRAIN WO:  Normal exam.  Labs: 07/15/2023:  B12 636, folate 12.9, SH 0.441, LDL 60  Current DMT:  none Past DMT:  ***, Copaxone  Current medications:  ropinirole  1mg  at bedtime, gabapentin  1500mg  daily, oxycodone , baclofen  20mg  TID PRN, citalopram  40mg  daily, Vibegron , Zofran  4mg , buspirone, Plavix , metformin , glipizide , Toprol  XL, lisinopril , pravastatin , Plavix   PAST MEDICAL HISTORY: Past Medical History:  Diagnosis  Date   Abnormal EKG 04/16/2012   Left Axis deviation 04/16/2012   Asthma    Chest pain 04/16/2012   Chronic abdominal pain    Chronic neck pain    C4-6 fusion   Chronic pain syndrome 01/11/2012   Class 2 severe obesity due to excess calories with serious comorbidity and body mass index (BMI) of 35.0 to 35.9 in adult 03/15/2017   CONSTIPATION 05/16/2009   Qualifier: Diagnosis of  By: Joshua FNP-BC, Kandice L    Diabetes mellitus without complication (HCC)    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ree at [facility]   Non-adherence to medical treatment 05/14/2009   Qualifier: Diagnosis of   By: Caroleen, [place]        RECTAL BLEEDING 07/05/2009   Qualifier: Diagnosis of  By: Georgina Palma       Sleep apnea    Spondylosis of thoracic region without myelopathy or radiculopathy 07/02/2014   Uncontrolled type 2 diabetes mellitus with hyperglycemia (HCC) 03/15/2017    PAST SURGICAL HISTORY: Past Surgical History:  Procedure Laterality Date   ABDOMINAL HYSTERECTOMY     APPENDECTOMY     BACK SURGERY  03/07/2012   Nerve Stimulator   BIOPSY  04/23/2022   Procedure: BIOPSY;  Surgeon: Eartha Angelia Sieving, MD;  [facility]: [facility];  Service: Gastroenterology;;   CHOLECYSTECTOMY     COLONOSCOPY  07/09/2009   RMR; normal rectum aside from anal canal hemorrhoids/scattered left-sided diverticula   COLONOSCOPY WITH PROPOFOL  N/A 04/23/2022   Procedure: COLONOSCOPY WITH PROPOFOL ;  Surgeon: Eartha Angelia Sieving, MD;  Location: [facility];  Service: Gastroenterology;  Laterality: N/A;   ESOPHAGOGASTRODUODENOSCOPY  05/22/2009   RMR; normal /small HH   ESOPHAGOGASTRODUODENOSCOPY  2007   RMR: non-critical Schatzki's rin, non-maniuplated   ESOPHAGOGASTRODUODENOSCOPY N/A 10/24/2013   Procedure: ESOPHAGOGASTRODUODENOSCOPY (EGD);  Surgeon: Lamar CHRISTELLA Hollingshead, MD;  Location: [facility];  Service: Endoscopy;  Laterality: N/A;  9:45   ESOPHAGOGASTRODUODENOSCOPY N/A 04/23/2022   Procedure: ESOPHAGOGASTRODUODENOSCOPY (EGD);  Surgeon: Eartha Angelia, Sieving, MD;  [facility]: [facility];  Service: Gastroenterology;  Laterality: N/A;   ESOPHAGOGASTRODUODENOSCOPY (EGD) WITH ESOPHAGEAL DILATION  03/17/2012   MFM:[id number] appearing esophageal mucosa of uncertain significance. Status post Agapito dilation followed by esophageal bx   ESOPHAGOGASTRODUODENOSCOPY (EGD) WITH PROPOFOL  N/A 07/24/2022   Procedure: ESOPHAGOGASTRODUODENOSCOPY (EGD) WITH PROPOFOL ;  Surgeon: Hollingshead Lamar CHRISTELLA, MD;  Location: [facility];  Service: Endoscopy;  Laterality: N/A;  7:30 am   MALONEY DILATION N/A 10/24/2013   Procedure: AGAPITO DILATION;  Surgeon: Lamar CHRISTELLA Hollingshead, MD;  Location: [facility];  Service: Endoscopy;  Laterality: N/A;   MANDIBLE SURGERY     NECK SURGERY     X2, last time 2013    MEDICATIONS: Current Outpatient Medications on File Prior to Visit  Medication Sig Dispense Refill   ACCU-CHEK GUIDE test strip USE TO CHECK BLOOD SUGAR 4 TIMES DAILY AS DIRECTED 100 strip 0   Accu-Chek Softclix Lancets lancets USE TO CHECK BLOOD SUGAR 4 TIMES DAILY AS DIRECTED 100 each 0   albuterol  (PROVENTIL ) (2.5 MG/3ML) 0.083% nebulizer solution Take 2.5 mg by nebulization every 4 (four) hours as needed for wheezing or shortness of breath.     albuterol  (VENTOLIN  HFA) 108 (90 Base) MCG/ACT inhaler Inhale 1-2 puffs into the lungs every 6 (six) hours as needed for wheezing or shortness of breath.      baclofen  (LIORESAL ) 20 MG tablet  Take 20 mg by mouth 3 (three) times daily.   3   blood glucose meter kit and supplies KIT Dispense based on patient and insurance preference. Use up to four times daily as directed. (FOR ICD-9 250.00, 250.01). 1 each 0   Blood Glucose Monitoring Suppl (ACCU-CHEK GUIDE ME) w/Device KIT USE TO CHECK BLOOD SUGAR DAILY AS DIRECTED 1 kit 0   citalopram  (CELEXA ) 40 MG tablet Take 40 mg by mouth every evening.      clopidogrel  (PLAVIX ) 75 MG tablet Take 75 mg by mouth daily.     Continuous Blood Gluc Receiver (DEXCOM G7 RECEIVER) DEVI Use to check glucose as directed 1 each 0   Continuous Blood Gluc Sensor (DEXCOM G7 SENSOR) MISC Change sensor every 10 days 3 each 2   estradiol  (ESTRACE ) 0.1 MG/GM vaginal cream Discard plastic applicator. Insert a blueberry  size amount (approximately 1 gram) of cream on fingertip inside vagina at bedtime every night for 1 week then every other night. For long term use. 30 g 3   gabapentin  (NEURONTIN ) 300 MG capsule Take 4-5 capsules by mouth daily.     glipiZIDE  (GLUCOTROL ) 10 MG tablet Take 10 mg by mouth daily before breakfast.     insulin  isophane & regular human KwikPen (NOVOLIN  70/30 KWIKPEN) (70-30) 100 UNIT/ML KwikPen Inject 10 Units into the skin 2 (two) times daily before a meal. (Patient taking differently: Inject 60 Units into the skin 2 (two) times daily before a meal.) 45 mL 2   Insulin  Pen Needle (B-D ULTRAFINE III SHORT PEN) 31G X 8 MM MISC USE TO INJECT INSULIN  2 TIMES DAILY 100 each 2   lisinopril  (ZESTRIL ) 10 MG tablet Take 10 mg by mouth daily.     metFORMIN  (GLUCOPHAGE ) 500 MG tablet Take 1,000 mg by mouth 2 (two) times daily.     metoprolol  succinate (TOPROL -XL) 50 MG 24 hr tablet Take 1 tablet (50 mg total) by mouth daily. Take with or immediately following a meal. 90 tablet 3   nitrofurantoin, macrocrystal-monohydrate, (MACROBID) 100 MG capsule Take 100 mg by mouth daily.     ondansetron  (ZOFRAN ) 4 MG tablet Take 1 tablet (4 mg total) by mouth 4  (four) times daily as needed for nausea or vomiting. 20 tablet 0   oxycodone  (ROXICODONE ) 30 MG immediate release tablet Take 15 mg by mouth See admin instructions. Take 15 mg (1/2 tablet) by mouth in the morning and mid afternoon. Take an additional 15 mg (1/2 tablet) as needed.     pantoprazole  (PROTONIX ) 40 MG tablet Take 1 tablet (40 mg total) by mouth 2 (two) times daily. 60 tablet 0   pravastatin  (PRAVACHOL ) 40 MG tablet Take 1 tablet (40 mg total) by mouth every evening. 30 tablet 1   rOPINIRole  (REQUIP ) 0.5 MG tablet Take 0.5 mg by mouth at bedtime.     Vibegron  (GEMTESA ) 75 MG TABS Take 1 tablet (75 mg total) by mouth daily. 30 tablet 11   Vitamin D , Ergocalciferol , (DRISDOL) 1.25 MG (50000 UNIT) CAPS capsule Take 50,000 Units by mouth once a week.     No current facility-administered medications on file prior to visit.    ALLERGIES: Allergies  Allergen Reactions   Peanut-Containing Drug Products Anaphylaxis and Hives    Patient states she is allergic to all nuts   Shellfish Allergy Anaphylaxis   Gluten Meal Other (See Comments)    Unknown    Latex Other (See Comments)    Blistering   Prednisone Hives and Other (See Comments)    Causes psychological issues    Nexium [Esomeprazole Magnesium ] Hives   Tape Rash    Paper tape is ok to use    FAMILY HISTORY: Family History  Problem Relation Age of Onset   Cancer Mother        unknown type   Diabetes Father    Diabetes Sister    Colon cancer Neg Hx     Objective:  *** General: No acute distress.  Patient appears well-groomed.   Head:  Normocephalic/atraumatic Eyes:  fundi examined but not visualized Neck: supple, no paraspinal tenderness, full range of motion Heart: regular rate and rhythm Neurological Exam: Mental status: alert and oriented to person, place, and time, speech fluent and not dysarthric, language intact. Cranial nerves: CN I: not tested CN II: pupils equal, round and reactive to light, visual fields  intact  CN III, IV, VI:  full range of motion, no nystagmus, no ptosis CN V: facial sensation intact. CN VII: upper and lower face symmetric CN VIII: hearing intact CN IX, X: gag intact, uvula midline CN XI: sternocleidomastoid and trapezius muscles intact CN XII: tongue midline Bulk & Tone: normal, no fasciculations. Motor:  muscle strength 5/5 throughout Sensation:  Pinprick and vibratory sensation intact. Deep Tendon Reflexes:  2+ throughout,  toes downgoing.   Finger to nose testing:  Without dysmetria.   Gait:  Normal station and stride.  Romberg negative.    Thank you for allowing me to take part in the care of this patient.  Juliene Dunnings, DO  CC: ***

## 2024-01-17 ENCOUNTER — Other Ambulatory Visit: Payer: Self-pay | Admitting: Gastroenterology

## 2024-01-17 NOTE — Telephone Encounter (Signed)
 1 month given appt scheduled 10/30.

## 2024-01-18 ENCOUNTER — Other Ambulatory Visit: Payer: Self-pay | Admitting: Gastroenterology

## 2024-01-27 ENCOUNTER — Ambulatory Visit: Admitting: Gastroenterology

## 2024-01-27 NOTE — Progress Notes (Deleted)
 GI Office Note    Referring Provider: Bertell Satterfield, MD Primary Care Physician:  Bertell Satterfield, MD Primary Gastroenterologist: Lamar HERO.Rourk, MD  Date:  01/27/2024  ID:  [name], DOB Aug 18, 1965, MRN [id number]   Chief Complaint   No chief complaint on file.  History of Present Illness  [name] is a 58 y.o. female with a history of asthma, diabetes, GERD, HLD, HTN, migraines, MS, sleep apnea, anemia, and cirrhosis presenting today with complaint of ***  Seen at [facility] in January by our GI service for anemia.  Hemoglobin 9.2 on admission with decreased to 7.2.  Was given 1 unit of blood in preparation for EGD and colonoscopy which she underwent on 04/23/2022, as outlined below.  Continue to have fatigue while inpatient but was able to tolerate diet without issue.  She had evidence of cirrhosis and portal hypertension on CT A/P as outlined below.  LFTs within normal limits.  MELD sodium 7.  Brief education given while inpatient and advised that we discuss further outpatient and perform additional workup as needed.  Advise follow-up in 4 weeks, CBC in 1 week post discharge, avoid NSAIDs, and continue PPI twice daily   CT A/P 04/19/2022: -Fatty filtration of the pancreas, likely related to diffuse mesenteric infiltration and cirrhosis -Morphologic changes of cirrhosis without biliary dilation.  Evidence of cholecystectomy -Sigmoid diverticulosis -Small pulmonary nodules measuring up to 5 mm -Diffuse pericolonic stranding likely related to hepatic colopathy, colitis less likely but not excluded.   EGD 04/23/22: -2 cm hiatal hernia -Nonbleeding gastric ulcer with clean ulcer base s/p biopsy -Gastritis -Duodenal erosion   Colonoscopy 04/23/22: -Hemorrhoids on perianal exam -Sigmoid and descending diverticulosis -Normal rectum   OV 05/19/2022.  Bowel movements every couple days.  Denies any melena or BRBPR, confusion, jaundice, pruritus, chest pain, shortness of breath.  Continuing  to have nausea and lack of appetite although this was slight improvement.  Denies any vomiting.  Having some issues with dysphagia.  Did report some bloating and gassiness. Advised to check for potential etiologies of cirrhosis including autoimmune serologies, ceruloplasmin, and ANA.  Advised to complete Carafate  and continue PPI twice daily.  Plan for EGD in April.  Cirrhosis education discussed.  Advised to follow-up for RUQ ultrasound and MELD labs in 6 months.   EGD 07/24/2022: - Grade 1 esophageal varices.  - Portal hypertensive gastropathy. - Previously noted gastric ulcer was completely healed.  - Normal duodenal bulb and second portion of the duodenum.  - No specimens collected. - Advised to avoid NSAIDs - Repeat 3 years.  Last office visit 11/17/2022.  Denies any issues with hematemesis, variceal bleeding, fevers, confusion/AMS.  Having bowel movement every 4 days or longer with bloating.  Sometimes greasy meal helps but not taking anything for constipation.  MELD 3.0: 13.  Reporting issues with nausea along with the constipation.  Pantoprazole  helping her stomach as well and if she forgets she definitely has breakthrough symptoms.  Denied diarrhea or melena denied jaundice.  Last start Linzess  145 mcg daily.  Additional cirrhosis labs ordered assessing for autoimmune etiologies and alpha-1 antitrypsin deficiency.  Ultrasound ordered.  Counseled on cirrhosis diet lifestyle modifications.  Advised may need to consider initiation of an NVB given grade 1 varices and portal hypertensive gastropathy, planning to start pending her blood work.  RUQ US  August 2024: Nodular contour, normal portal flow.  No focal liver lesion.  Hospitalized at [facility] in May 2025 for altered mental status and determined to have acute  metabolic encephalopathy secondary to UTI.  Urine cultures grew E. coli and was started on IV antibiotics and discharged once stable.  Today:  US :  AFP:  Hep A/B vaccination: EGD:   BB:   Ascites/peripheral edema:  Diuretics:  Paracentesis:  History of SBP:  Encephalopathy:       Wt Readings from Last 6 Encounters:  08/11/23 205 lb 4 oz (93.1 kg)  12/18/22 190 lb (86.2 kg)  11/17/22 190 lb 4.8 oz (86.3 kg)  07/24/22 180 lb 5.4 oz (81.8 kg)  07/17/22 180 lb 5.4 oz (81.8 kg)  07/15/22 185 lb (83.9 kg)    There is no height or weight on file to calculate BMI.   Current Outpatient Medications  Medication Sig Dispense Refill   ACCU-CHEK GUIDE test strip USE TO CHECK BLOOD SUGAR 4 TIMES DAILY AS DIRECTED 100 strip 0   Accu-Chek Softclix Lancets lancets USE TO CHECK BLOOD SUGAR 4 TIMES DAILY AS DIRECTED 100 each 0   albuterol  (PROVENTIL ) (2.5 MG/3ML) 0.083% nebulizer solution Take 2.5 mg by nebulization every 4 (four) hours as needed for wheezing or shortness of breath.     albuterol  (VENTOLIN  HFA) 108 (90 Base) MCG/ACT inhaler Inhale 1-2 puffs into the lungs every 6 (six) hours as needed for wheezing or shortness of breath.      baclofen  (LIORESAL ) 20 MG tablet Take 20 mg by mouth 3 (three) times daily.   3   blood glucose meter kit and supplies KIT Dispense based on patient and insurance preference. Use up to four times daily as directed. (FOR ICD-9 250.00, 250.01). 1 each 0   Blood Glucose Monitoring Suppl (ACCU-CHEK GUIDE ME) w/Device KIT USE TO CHECK BLOOD SUGAR DAILY AS DIRECTED 1 kit 0   citalopram  (CELEXA ) 40 MG tablet Take 40 mg by mouth every evening.      clopidogrel  (PLAVIX ) 75 MG tablet Take 75 mg by mouth daily.     Continuous Blood Gluc Receiver (DEXCOM G7 RECEIVER) DEVI Use to check glucose as directed 1 each 0   Continuous Blood Gluc Sensor (DEXCOM G7 SENSOR) MISC Change sensor every 10 days 3 each 2   estradiol  (ESTRACE ) 0.1 MG/GM vaginal cream Discard plastic applicator. Insert a blueberry size amount (approximately 1 gram) of cream on fingertip inside vagina at bedtime every night for 1 week then every other night. For long term use. 30 g 3    gabapentin  (NEURONTIN ) 300 MG capsule Take 4-5 capsules by mouth daily.     glipiZIDE  (GLUCOTROL ) 10 MG tablet Take 10 mg by mouth daily before breakfast.     insulin  isophane & regular human KwikPen (NOVOLIN  70/30 KWIKPEN) (70-30) 100 UNIT/ML KwikPen Inject 10 Units into the skin 2 (two) times daily before a meal. (Patient taking differently: Inject 60 Units into the skin 2 (two) times daily before a meal.) 45 mL 2   Insulin  Pen Needle (B-D ULTRAFINE III SHORT PEN) 31G X 8 MM MISC USE TO INJECT INSULIN  2 TIMES DAILY 100 each 2   lisinopril  (ZESTRIL ) 10 MG tablet Take 10 mg by mouth daily.     metFORMIN  (GLUCOPHAGE ) 500 MG tablet Take 1,000 mg by mouth 2 (two) times daily.     metoprolol  succinate (TOPROL -XL) 50 MG 24 hr tablet Take 1 tablet (50 mg total) by mouth daily. Take with or immediately following a meal. 90 tablet 3   nitrofurantoin, macrocrystal-monohydrate, (MACROBID) 100 MG capsule Take 100 mg by mouth daily.     ondansetron  (ZOFRAN ) 4 MG tablet  Take 1 tablet (4 mg total) by mouth 4 (four) times daily as needed for nausea or vomiting. 20 tablet 0   oxycodone  (ROXICODONE ) 30 MG immediate release tablet Take 15 mg by mouth See admin instructions. Take 15 mg (1/2 tablet) by mouth in the morning and mid afternoon. Take an additional 15 mg (1/2 tablet) as needed.     pantoprazole  (PROTONIX ) 40 MG tablet Take 1 tablet (40 mg total) by mouth 2 (two) times daily. 60 tablet 0   pravastatin  (PRAVACHOL ) 40 MG tablet Take 1 tablet (40 mg total) by mouth every evening. 30 tablet 1   rOPINIRole  (REQUIP ) 0.5 MG tablet Take 0.5 mg by mouth at bedtime.     Vibegron  (GEMTESA ) 75 MG TABS Take 1 tablet (75 mg total) by mouth daily. 30 tablet 11   Vitamin D , Ergocalciferol , (DRISDOL) 1.25 MG (50000 UNIT) CAPS capsule Take 50,000 Units by mouth once a week.     No current facility-administered medications for this visit.    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 Andree at [facility]   Non-adherence to medical treatment 05/14/2009   Qualifier: Diagnosis of   By: Caroleen, [place]        RECTAL BLEEDING 07/05/2009   Qualifier: Diagnosis of   By: Georgina Palma       Sleep apnea    Spondylosis of thoracic region without myelopathy or radiculopathy 07/02/2014   Uncontrolled type 2 diabetes mellitus with hyperglycemia (HCC) 03/15/2017    Past Surgical History:  Procedure Laterality Date   ABDOMINAL HYSTERECTOMY     APPENDECTOMY     BACK SURGERY  03/07/2012   Nerve Stimulator   BIOPSY  04/23/2022   Procedure: BIOPSY;  Surgeon: Eartha Angelia Sieving, MD;  Location: [facility];  Service: Gastroenterology;;   CHOLECYSTECTOMY     COLONOSCOPY  07/09/2009   RMR; normal rectum aside from anal canal hemorrhoids/scattered left-sided diverticula   COLONOSCOPY WITH PROPOFOL  N/A 04/23/2022   Procedure: COLONOSCOPY WITH PROPOFOL ;  Surgeon: Eartha Angelia Sieving, MD;  Location: [facility];  Service: Gastroenterology;   Laterality: N/A;   ESOPHAGOGASTRODUODENOSCOPY  05/22/2009   RMR; normal /small HH   ESOPHAGOGASTRODUODENOSCOPY  2007   RMR: non-critical Schatzki's rin, non-maniuplated   ESOPHAGOGASTRODUODENOSCOPY N/A 10/24/2013   Procedure: ESOPHAGOGASTRODUODENOSCOPY (EGD);  Surgeon: Lamar CHRISTELLA Hollingshead, MD;  Location: [facility];  Service: Endoscopy;  Laterality: N/A;  9:45   ESOPHAGOGASTRODUODENOSCOPY N/A 04/23/2022   Procedure: ESOPHAGOGASTRODUODENOSCOPY (EGD);  Surgeon: Eartha Flavors, Toribio, MD;  Location: [facility];  Service: Gastroenterology;  Laterality: N/A;   ESOPHAGOGASTRODUODENOSCOPY (EGD) WITH ESOPHAGEAL DILATION  03/17/2012   MFM:[id number] appearing esophageal mucosa of uncertain significance. Status post Agapito dilation followed by esophageal bx   ESOPHAGOGASTRODUODENOSCOPY (EGD) WITH PROPOFOL  N/A 07/24/2022   Procedure: ESOPHAGOGASTRODUODENOSCOPY (EGD) WITH PROPOFOL ;  Surgeon: Shaaron Lamar HERO, MD;  Location: [facility];  Service: Endoscopy;  Laterality: N/A;  7:30 am   MALONEY DILATION N/A 10/24/2013   Procedure: AGAPITO DILATION;  Surgeon: Lamar HERO Shaaron, MD;  Location: [facility];  Service: Endoscopy;  Laterality: N/A;   MANDIBLE SURGERY     NECK SURGERY     X2, last time 2013    Family History  Problem Relation Age of Onset   Cancer Mother        unknown type   Diabetes Father    Diabetes Sister    Colon cancer Neg Hx     Allergies as of 01/27/2024 - Review Complete 08/12/2023  Allergen Reaction Noted   Peanut-containing drug products Anaphylaxis and Hives    Shellfish allergy Anaphylaxis 02/23/2018   Gluten meal Other (See Comments) 07/11/2014   Latex Other (See Comments) 01/15/2011   Prednisone Hives and Other (See Comments)    Nexium [esomeprazole magnesium ] Hives 08/15/2011   Tape Rash 06/26/2014    Social History   Socioeconomic History   Marital status: Married    Spouse name: Not on file   Number of children: Not on file   Years of education: Not on  file   Highest education level: Not on file  Occupational History   Not on file  Tobacco Use   Smoking status: Never   Smokeless tobacco: Never  Vaping Use   Vaping status: Never Used  Substance and Sexual Activity   Alcohol  use: Not Currently    Comment: only three times a year.   Drug use: No   Sexual activity: Yes    Birth control/protection: Surgical  Other Topics Concern   Not on file  Social History Narrative   5 kids, raising grandchild   Social Drivers of Corporate Investment Banker Strain: Not on file  Food Insecurity: No Food Insecurity (08/11/2023)   Hunger Vital Sign    Worried About Running Out of Food in the Last Year: Never true    Ran Out of Food in the Last Year: Never true  Transportation Needs: No Transportation Needs (08/11/2023)   PRAPARE - Administrator, Civil Service (Medical): No    Lack of Transportation (Non-Medical): No  Physical Activity: Not on file  Stress: Not on file  Social Connections: Not on file    Review of Systems   Gen: Denies fever, chills, anorexia. Denies fatigue, weakness, weight loss.  CV: Denies chest pain, palpitations, syncope, peripheral edema, and claudication. Resp: Denies dyspnea at rest, cough, wheezing, coughing up blood, and pleurisy. GI: See HPI Derm: Denies rash, itching, dry skin Psych: Denies depression, anxiety, memory loss, confusion. No homicidal or suicidal ideation.  Heme: Denies bruising, bleeding, and enlarged lymph nodes.  Physical Exam   There were no vitals taken for this visit.  General:   Alert and oriented. No distress noted. Pleasant and cooperative.  Head:  Normocephalic and atraumatic. Eyes:  Conjuctiva clear without scleral icterus. Mouth:  Oral mucosa pink and moist. Good dentition. No lesions. Lungs:  Clear  to auscultation bilaterally. No wheezes, rales, or rhonchi. No distress.  Heart:  S1, S2 present without murmurs appreciated.  Abdomen:  +BS, soft, non-tender and  non-distended. No rebound or guarding. No HSM or masses noted. Rectal: *** Msk:  Symmetrical without gross deformities. Normal posture. Extremities:  Without edema. Neurologic:  Alert and  oriented x4 Psych:  Alert and cooperative. Normal mood and affect.  Assessment & Plan  [name] is a 58 y.o. female presenting today with ***    Follow up   ***Follow up ***    Charmaine Melia, MSN, FNP-BC, AGACNP-BC [facility]

## 2024-02-03 ENCOUNTER — Encounter (INDEPENDENT_AMBULATORY_CARE_PROVIDER_SITE_OTHER): Payer: Self-pay | Admitting: Gastroenterology

## 2024-03-29 ENCOUNTER — Other Ambulatory Visit: Payer: Self-pay | Admitting: Gastroenterology

## 2024-05-01 ENCOUNTER — Other Ambulatory Visit: Payer: Self-pay | Admitting: Gastroenterology
